# Patient Record
Sex: Male | Born: 1975
Health system: Southern US, Community
[De-identification: ages and names within clinical notes are randomized; demographics above are authoritative.]

## PROBLEM LIST (undated history)

## (undated) DIAGNOSIS — K5792 Diverticulitis of intestine, part unspecified, without perforation or abscess without bleeding: Secondary | ICD-10-CM

## (undated) DIAGNOSIS — G8929 Other chronic pain: Secondary | ICD-10-CM

## (undated) DIAGNOSIS — N200 Calculus of kidney: Secondary | ICD-10-CM

## (undated) DIAGNOSIS — Z765 Malingerer [conscious simulation]: Secondary | ICD-10-CM

## (undated) DIAGNOSIS — R109 Unspecified abdominal pain: Secondary | ICD-10-CM

## (undated) DIAGNOSIS — I10 Essential (primary) hypertension: Secondary | ICD-10-CM

## (undated) DIAGNOSIS — K861 Other chronic pancreatitis: Secondary | ICD-10-CM

## (undated) HISTORY — DX: Essential (primary) hypertension: I10

## (undated) HISTORY — PX: ERCP W/ METAL STENT PLACEMENT: SHX1521

## (undated) HISTORY — DX: Other chronic pancreatitis: K86.1

## (undated) HISTORY — PX: KIDNEY STONE SURGERY: SHX686

## (undated) HISTORY — DX: Diverticulitis of intestine, part unspecified, without perforation or abscess without bleeding: K57.92

## (undated) HISTORY — PX: CHOLECYSTECTOMY: SHX55

## (undated) HISTORY — PX: APPENDECTOMY: SHX54

---

## 2000-03-26 ENCOUNTER — Encounter: Admission: RE | Admit: 2000-03-26 | Discharge: 2000-03-26 | Payer: Self-pay | Admitting: Family Medicine

## 2000-03-26 ENCOUNTER — Encounter: Payer: Self-pay | Admitting: Family Medicine

## 2001-11-19 ENCOUNTER — Inpatient Hospital Stay (HOSPITAL_COMMUNITY): Admission: EM | Admit: 2001-11-19 | Discharge: 2001-11-21 | Payer: Self-pay

## 2001-11-19 ENCOUNTER — Encounter: Payer: Self-pay | Admitting: Neurosurgery

## 2002-06-18 ENCOUNTER — Encounter (HOSPITAL_COMMUNITY): Admission: RE | Admit: 2002-06-18 | Discharge: 2002-07-18 | Payer: Self-pay | Admitting: Family Medicine

## 2002-06-19 ENCOUNTER — Encounter: Payer: Self-pay | Admitting: Family Medicine

## 2002-08-13 ENCOUNTER — Encounter: Payer: Self-pay | Admitting: Family Medicine

## 2002-08-13 ENCOUNTER — Ambulatory Visit (HOSPITAL_COMMUNITY): Admission: RE | Admit: 2002-08-13 | Discharge: 2002-08-13 | Payer: Self-pay | Admitting: Family Medicine

## 2003-12-10 ENCOUNTER — Emergency Department (HOSPITAL_COMMUNITY): Admission: EM | Admit: 2003-12-10 | Discharge: 2003-12-10 | Payer: Self-pay | Admitting: *Deleted

## 2003-12-29 ENCOUNTER — Ambulatory Visit (HOSPITAL_COMMUNITY): Admission: RE | Admit: 2003-12-29 | Discharge: 2003-12-29 | Payer: Self-pay | Admitting: Internal Medicine

## 2004-01-03 ENCOUNTER — Ambulatory Visit (HOSPITAL_COMMUNITY): Admission: RE | Admit: 2004-01-03 | Discharge: 2004-01-03 | Payer: Self-pay | Admitting: Urology

## 2004-01-26 ENCOUNTER — Emergency Department (HOSPITAL_COMMUNITY): Admission: EM | Admit: 2004-01-26 | Discharge: 2004-01-26 | Payer: Self-pay | Admitting: Emergency Medicine

## 2004-03-02 ENCOUNTER — Ambulatory Visit (HOSPITAL_COMMUNITY): Admission: RE | Admit: 2004-03-02 | Discharge: 2004-03-02 | Payer: Self-pay | Admitting: Family Medicine

## 2004-07-14 ENCOUNTER — Ambulatory Visit (HOSPITAL_COMMUNITY): Admission: RE | Admit: 2004-07-14 | Discharge: 2004-07-14 | Payer: Self-pay | Admitting: Family Medicine

## 2004-07-25 ENCOUNTER — Emergency Department (HOSPITAL_COMMUNITY): Admission: EM | Admit: 2004-07-25 | Discharge: 2004-07-25 | Payer: Self-pay | Admitting: Emergency Medicine

## 2004-07-29 ENCOUNTER — Emergency Department (HOSPITAL_COMMUNITY): Admission: EM | Admit: 2004-07-29 | Discharge: 2004-07-29 | Payer: Self-pay | Admitting: Emergency Medicine

## 2004-08-11 ENCOUNTER — Encounter (INDEPENDENT_AMBULATORY_CARE_PROVIDER_SITE_OTHER): Payer: Self-pay | Admitting: *Deleted

## 2004-08-11 ENCOUNTER — Ambulatory Visit (HOSPITAL_BASED_OUTPATIENT_CLINIC_OR_DEPARTMENT_OTHER): Admission: RE | Admit: 2004-08-11 | Discharge: 2004-08-11 | Payer: Self-pay | Admitting: Urology

## 2004-08-11 ENCOUNTER — Ambulatory Visit (HOSPITAL_COMMUNITY): Admission: RE | Admit: 2004-08-11 | Discharge: 2004-08-11 | Payer: Self-pay | Admitting: Urology

## 2004-08-17 ENCOUNTER — Inpatient Hospital Stay (HOSPITAL_COMMUNITY): Admission: EM | Admit: 2004-08-17 | Discharge: 2004-08-18 | Payer: Self-pay | Admitting: Emergency Medicine

## 2004-08-20 ENCOUNTER — Emergency Department (HOSPITAL_COMMUNITY): Admission: EM | Admit: 2004-08-20 | Discharge: 2004-08-20 | Payer: Self-pay | Admitting: Emergency Medicine

## 2005-02-13 ENCOUNTER — Ambulatory Visit (HOSPITAL_COMMUNITY): Admission: RE | Admit: 2005-02-13 | Discharge: 2005-02-13 | Payer: Self-pay | Admitting: Neurosurgery

## 2005-05-11 ENCOUNTER — Emergency Department (HOSPITAL_COMMUNITY): Admission: EM | Admit: 2005-05-11 | Discharge: 2005-05-12 | Payer: Self-pay | Admitting: Emergency Medicine

## 2005-05-13 ENCOUNTER — Emergency Department (HOSPITAL_COMMUNITY): Admission: EM | Admit: 2005-05-13 | Discharge: 2005-05-13 | Payer: Self-pay | Admitting: Emergency Medicine

## 2005-05-23 ENCOUNTER — Ambulatory Visit (HOSPITAL_COMMUNITY): Admission: RE | Admit: 2005-05-23 | Discharge: 2005-05-23 | Payer: Self-pay | Admitting: Gastroenterology

## 2005-05-26 ENCOUNTER — Emergency Department (HOSPITAL_COMMUNITY): Admission: EM | Admit: 2005-05-26 | Discharge: 2005-05-26 | Payer: Self-pay | Admitting: Emergency Medicine

## 2005-05-28 DIAGNOSIS — K861 Other chronic pancreatitis: Secondary | ICD-10-CM

## 2005-05-28 HISTORY — DX: Other chronic pancreatitis: K86.1

## 2005-06-15 ENCOUNTER — Encounter: Admission: RE | Admit: 2005-06-15 | Discharge: 2005-06-15 | Payer: Self-pay | Admitting: Gastroenterology

## 2005-06-16 ENCOUNTER — Emergency Department (HOSPITAL_COMMUNITY): Admission: EM | Admit: 2005-06-16 | Discharge: 2005-06-16 | Payer: Self-pay | Admitting: Emergency Medicine

## 2005-06-21 ENCOUNTER — Emergency Department (HOSPITAL_COMMUNITY): Admission: EM | Admit: 2005-06-21 | Discharge: 2005-06-21 | Payer: Self-pay | Admitting: Emergency Medicine

## 2005-06-25 ENCOUNTER — Emergency Department (HOSPITAL_COMMUNITY): Admission: EM | Admit: 2005-06-25 | Discharge: 2005-06-26 | Payer: Self-pay | Admitting: Emergency Medicine

## 2005-06-28 ENCOUNTER — Ambulatory Visit: Payer: Self-pay | Admitting: Gastroenterology

## 2005-06-28 ENCOUNTER — Ambulatory Visit (HOSPITAL_COMMUNITY): Admission: RE | Admit: 2005-06-28 | Discharge: 2005-06-28 | Payer: Self-pay | Admitting: Gastroenterology

## 2005-08-11 ENCOUNTER — Emergency Department (HOSPITAL_COMMUNITY): Admission: EM | Admit: 2005-08-11 | Discharge: 2005-08-11 | Payer: Self-pay | Admitting: Emergency Medicine

## 2005-08-27 ENCOUNTER — Inpatient Hospital Stay (HOSPITAL_COMMUNITY): Admission: RE | Admit: 2005-08-27 | Discharge: 2005-08-28 | Payer: Self-pay | Admitting: Psychiatry

## 2005-08-27 ENCOUNTER — Emergency Department (HOSPITAL_COMMUNITY): Admission: EM | Admit: 2005-08-27 | Discharge: 2005-08-27 | Payer: Self-pay | Admitting: Emergency Medicine

## 2005-08-28 ENCOUNTER — Ambulatory Visit: Payer: Self-pay | Admitting: Psychiatry

## 2005-09-13 ENCOUNTER — Ambulatory Visit: Payer: Self-pay | Admitting: Internal Medicine

## 2005-12-16 ENCOUNTER — Emergency Department (HOSPITAL_COMMUNITY): Admission: EM | Admit: 2005-12-16 | Discharge: 2005-12-16 | Payer: Self-pay | Admitting: Emergency Medicine

## 2005-12-19 ENCOUNTER — Emergency Department (HOSPITAL_COMMUNITY): Admission: EM | Admit: 2005-12-19 | Discharge: 2005-12-19 | Payer: Self-pay | Admitting: Emergency Medicine

## 2006-01-29 ENCOUNTER — Emergency Department (HOSPITAL_COMMUNITY): Admission: EM | Admit: 2006-01-29 | Discharge: 2006-01-29 | Payer: Self-pay | Admitting: Emergency Medicine

## 2006-02-04 ENCOUNTER — Emergency Department (HOSPITAL_COMMUNITY): Admission: EM | Admit: 2006-02-04 | Discharge: 2006-02-05 | Payer: Self-pay | Admitting: Emergency Medicine

## 2006-02-26 ENCOUNTER — Emergency Department (HOSPITAL_COMMUNITY): Admission: EM | Admit: 2006-02-26 | Discharge: 2006-02-26 | Payer: Self-pay | Admitting: Emergency Medicine

## 2006-03-24 ENCOUNTER — Emergency Department (HOSPITAL_COMMUNITY): Admission: EM | Admit: 2006-03-24 | Discharge: 2006-03-24 | Payer: Self-pay | Admitting: Emergency Medicine

## 2006-03-26 ENCOUNTER — Emergency Department (HOSPITAL_COMMUNITY): Admission: EM | Admit: 2006-03-26 | Discharge: 2006-03-26 | Payer: Self-pay | Admitting: Emergency Medicine

## 2006-03-30 ENCOUNTER — Emergency Department (HOSPITAL_COMMUNITY): Admission: EM | Admit: 2006-03-30 | Discharge: 2006-03-31 | Payer: Self-pay | Admitting: Emergency Medicine

## 2006-04-06 ENCOUNTER — Emergency Department (HOSPITAL_COMMUNITY): Admission: EM | Admit: 2006-04-06 | Discharge: 2006-04-06 | Payer: Self-pay | Admitting: Emergency Medicine

## 2006-04-18 ENCOUNTER — Emergency Department (HOSPITAL_COMMUNITY): Admission: EM | Admit: 2006-04-18 | Discharge: 2006-04-19 | Payer: Self-pay | Admitting: Emergency Medicine

## 2006-04-23 ENCOUNTER — Emergency Department (HOSPITAL_COMMUNITY): Admission: EM | Admit: 2006-04-23 | Discharge: 2006-04-23 | Payer: Self-pay | Admitting: Emergency Medicine

## 2006-07-19 ENCOUNTER — Emergency Department (HOSPITAL_COMMUNITY): Admission: EM | Admit: 2006-07-19 | Discharge: 2006-07-19 | Payer: Self-pay | Admitting: Emergency Medicine

## 2006-08-06 ENCOUNTER — Emergency Department (HOSPITAL_COMMUNITY): Admission: EM | Admit: 2006-08-06 | Discharge: 2006-08-06 | Payer: Self-pay | Admitting: Emergency Medicine

## 2006-09-09 ENCOUNTER — Emergency Department (HOSPITAL_COMMUNITY): Admission: EM | Admit: 2006-09-09 | Discharge: 2006-09-09 | Payer: Self-pay | Admitting: Emergency Medicine

## 2006-09-11 ENCOUNTER — Emergency Department (HOSPITAL_COMMUNITY): Admission: EM | Admit: 2006-09-11 | Discharge: 2006-09-11 | Payer: Self-pay | Admitting: Emergency Medicine

## 2007-01-19 ENCOUNTER — Emergency Department (HOSPITAL_COMMUNITY): Admission: EM | Admit: 2007-01-19 | Discharge: 2007-01-19 | Payer: Self-pay | Admitting: Emergency Medicine

## 2007-05-26 ENCOUNTER — Inpatient Hospital Stay (HOSPITAL_COMMUNITY): Admission: EM | Admit: 2007-05-26 | Discharge: 2007-05-27 | Payer: Self-pay | Admitting: Emergency Medicine

## 2007-09-16 ENCOUNTER — Emergency Department (HOSPITAL_COMMUNITY): Admission: EM | Admit: 2007-09-16 | Discharge: 2007-09-16 | Payer: Self-pay | Admitting: Emergency Medicine

## 2007-09-21 ENCOUNTER — Emergency Department (HOSPITAL_COMMUNITY): Admission: EM | Admit: 2007-09-21 | Discharge: 2007-09-21 | Payer: Self-pay | Admitting: Emergency Medicine

## 2007-11-04 ENCOUNTER — Emergency Department (HOSPITAL_COMMUNITY): Admission: EM | Admit: 2007-11-04 | Discharge: 2007-11-04 | Payer: Self-pay | Admitting: Emergency Medicine

## 2007-12-06 ENCOUNTER — Emergency Department (HOSPITAL_COMMUNITY): Admission: EM | Admit: 2007-12-06 | Discharge: 2007-12-06 | Payer: Self-pay | Admitting: Emergency Medicine

## 2008-01-06 ENCOUNTER — Ambulatory Visit (HOSPITAL_COMMUNITY): Admission: RE | Admit: 2008-01-06 | Discharge: 2008-01-06 | Payer: Self-pay | Admitting: Family Medicine

## 2008-01-12 ENCOUNTER — Emergency Department (HOSPITAL_BASED_OUTPATIENT_CLINIC_OR_DEPARTMENT_OTHER): Admission: EM | Admit: 2008-01-12 | Discharge: 2008-01-12 | Payer: Self-pay | Admitting: Emergency Medicine

## 2008-01-19 ENCOUNTER — Ambulatory Visit: Payer: Self-pay | Admitting: Gastroenterology

## 2008-01-23 ENCOUNTER — Emergency Department (HOSPITAL_COMMUNITY): Admission: EM | Admit: 2008-01-23 | Discharge: 2008-01-23 | Payer: Self-pay | Admitting: Emergency Medicine

## 2008-02-04 ENCOUNTER — Emergency Department (HOSPITAL_COMMUNITY): Admission: EM | Admit: 2008-02-04 | Discharge: 2008-02-04 | Payer: Self-pay | Admitting: Emergency Medicine

## 2008-02-10 ENCOUNTER — Emergency Department (HOSPITAL_COMMUNITY): Admission: EM | Admit: 2008-02-10 | Discharge: 2008-02-10 | Payer: Self-pay | Admitting: Emergency Medicine

## 2008-03-18 ENCOUNTER — Emergency Department (HOSPITAL_COMMUNITY): Admission: EM | Admit: 2008-03-18 | Discharge: 2008-03-18 | Payer: Self-pay | Admitting: Emergency Medicine

## 2008-04-06 ENCOUNTER — Emergency Department (HOSPITAL_COMMUNITY): Admission: EM | Admit: 2008-04-06 | Discharge: 2008-04-06 | Payer: Self-pay | Admitting: Emergency Medicine

## 2008-04-27 ENCOUNTER — Emergency Department (HOSPITAL_COMMUNITY): Admission: EM | Admit: 2008-04-27 | Discharge: 2008-04-27 | Payer: Self-pay | Admitting: Emergency Medicine

## 2008-05-24 ENCOUNTER — Ambulatory Visit: Payer: Self-pay | Admitting: Diagnostic Radiology

## 2008-05-24 ENCOUNTER — Emergency Department (HOSPITAL_BASED_OUTPATIENT_CLINIC_OR_DEPARTMENT_OTHER): Admission: EM | Admit: 2008-05-24 | Discharge: 2008-05-24 | Payer: Self-pay | Admitting: Emergency Medicine

## 2008-08-10 ENCOUNTER — Emergency Department (HOSPITAL_COMMUNITY): Admission: EM | Admit: 2008-08-10 | Discharge: 2008-08-10 | Payer: Self-pay | Admitting: Emergency Medicine

## 2008-09-23 ENCOUNTER — Emergency Department (HOSPITAL_COMMUNITY): Admission: EM | Admit: 2008-09-23 | Discharge: 2008-09-24 | Payer: Self-pay | Admitting: Emergency Medicine

## 2008-10-13 ENCOUNTER — Emergency Department (HOSPITAL_COMMUNITY): Admission: EM | Admit: 2008-10-13 | Discharge: 2008-10-13 | Payer: Self-pay | Admitting: Ophthalmology

## 2008-11-11 ENCOUNTER — Emergency Department (HOSPITAL_COMMUNITY): Admission: EM | Admit: 2008-11-11 | Discharge: 2008-11-11 | Payer: Self-pay | Admitting: Emergency Medicine

## 2009-01-06 ENCOUNTER — Emergency Department (HOSPITAL_COMMUNITY): Admission: EM | Admit: 2009-01-06 | Discharge: 2009-01-06 | Payer: Self-pay | Admitting: Emergency Medicine

## 2009-04-19 ENCOUNTER — Emergency Department (HOSPITAL_COMMUNITY): Admission: EM | Admit: 2009-04-19 | Discharge: 2009-04-19 | Payer: Self-pay | Admitting: Emergency Medicine

## 2009-04-27 ENCOUNTER — Emergency Department (HOSPITAL_COMMUNITY): Admission: EM | Admit: 2009-04-27 | Discharge: 2009-04-27 | Payer: Self-pay | Admitting: Emergency Medicine

## 2009-05-16 ENCOUNTER — Emergency Department (HOSPITAL_COMMUNITY): Admission: EM | Admit: 2009-05-16 | Discharge: 2009-05-16 | Payer: Self-pay | Admitting: Emergency Medicine

## 2009-06-05 ENCOUNTER — Emergency Department (HOSPITAL_COMMUNITY): Admission: EM | Admit: 2009-06-05 | Discharge: 2009-06-05 | Payer: Self-pay | Admitting: Emergency Medicine

## 2009-09-07 ENCOUNTER — Emergency Department (HOSPITAL_COMMUNITY): Admission: EM | Admit: 2009-09-07 | Discharge: 2009-09-07 | Payer: Self-pay | Admitting: Emergency Medicine

## 2009-10-08 ENCOUNTER — Emergency Department (HOSPITAL_COMMUNITY): Admission: EM | Admit: 2009-10-08 | Discharge: 2009-10-08 | Payer: Self-pay | Admitting: Emergency Medicine

## 2009-10-17 ENCOUNTER — Emergency Department (HOSPITAL_COMMUNITY): Admission: EM | Admit: 2009-10-17 | Discharge: 2009-10-18 | Payer: Self-pay | Admitting: Emergency Medicine

## 2009-11-02 ENCOUNTER — Emergency Department (HOSPITAL_COMMUNITY): Admission: EM | Admit: 2009-11-02 | Discharge: 2009-11-02 | Payer: Self-pay | Admitting: Emergency Medicine

## 2009-11-06 ENCOUNTER — Emergency Department (HOSPITAL_COMMUNITY): Admission: EM | Admit: 2009-11-06 | Discharge: 2009-11-06 | Payer: Self-pay | Admitting: Emergency Medicine

## 2009-11-18 ENCOUNTER — Emergency Department (HOSPITAL_COMMUNITY): Admission: EM | Admit: 2009-11-18 | Discharge: 2009-11-18 | Payer: Self-pay | Admitting: Emergency Medicine

## 2009-12-07 ENCOUNTER — Ambulatory Visit (HOSPITAL_BASED_OUTPATIENT_CLINIC_OR_DEPARTMENT_OTHER): Admission: RE | Admit: 2009-12-07 | Discharge: 2009-12-07 | Payer: Self-pay | Admitting: Urology

## 2009-12-31 ENCOUNTER — Emergency Department (HOSPITAL_COMMUNITY): Admission: EM | Admit: 2009-12-31 | Discharge: 2009-12-31 | Payer: Self-pay | Admitting: Internal Medicine

## 2010-01-28 ENCOUNTER — Emergency Department (HOSPITAL_COMMUNITY): Admission: EM | Admit: 2010-01-28 | Discharge: 2010-01-28 | Payer: Self-pay | Admitting: Emergency Medicine

## 2010-02-07 ENCOUNTER — Emergency Department (HOSPITAL_COMMUNITY): Admission: EM | Admit: 2010-02-07 | Discharge: 2010-02-07 | Payer: Self-pay | Admitting: Emergency Medicine

## 2010-02-16 ENCOUNTER — Emergency Department (HOSPITAL_COMMUNITY): Admission: EM | Admit: 2010-02-16 | Discharge: 2010-02-16 | Payer: Self-pay | Admitting: Emergency Medicine

## 2010-02-26 ENCOUNTER — Emergency Department (HOSPITAL_COMMUNITY): Admission: EM | Admit: 2010-02-26 | Discharge: 2010-02-26 | Payer: Self-pay | Admitting: Emergency Medicine

## 2010-03-16 ENCOUNTER — Emergency Department (HOSPITAL_COMMUNITY): Admission: EM | Admit: 2010-03-16 | Discharge: 2010-03-16 | Payer: Self-pay | Admitting: Emergency Medicine

## 2010-03-21 ENCOUNTER — Emergency Department (HOSPITAL_COMMUNITY): Admission: EM | Admit: 2010-03-21 | Discharge: 2010-03-21 | Payer: Self-pay | Admitting: Emergency Medicine

## 2010-03-25 ENCOUNTER — Emergency Department (HOSPITAL_COMMUNITY): Admission: EM | Admit: 2010-03-25 | Discharge: 2010-03-25 | Payer: Self-pay | Admitting: Emergency Medicine

## 2010-04-09 ENCOUNTER — Emergency Department (HOSPITAL_COMMUNITY): Admission: EM | Admit: 2010-04-09 | Discharge: 2010-04-09 | Payer: Self-pay | Admitting: Emergency Medicine

## 2010-04-23 ENCOUNTER — Emergency Department (HOSPITAL_COMMUNITY): Admission: EM | Admit: 2010-04-23 | Discharge: 2010-04-23 | Payer: Self-pay | Admitting: Emergency Medicine

## 2010-05-04 ENCOUNTER — Emergency Department (HOSPITAL_COMMUNITY): Admission: EM | Admit: 2010-05-04 | Discharge: 2010-03-07 | Payer: Self-pay | Admitting: Emergency Medicine

## 2010-05-16 ENCOUNTER — Emergency Department (HOSPITAL_COMMUNITY)
Admission: EM | Admit: 2010-05-16 | Discharge: 2010-05-16 | Payer: Self-pay | Source: Home / Self Care | Admitting: Emergency Medicine

## 2010-05-20 ENCOUNTER — Emergency Department (HOSPITAL_COMMUNITY)
Admission: EM | Admit: 2010-05-20 | Discharge: 2010-05-20 | Payer: Self-pay | Source: Home / Self Care | Admitting: Emergency Medicine

## 2010-05-25 ENCOUNTER — Emergency Department (HOSPITAL_COMMUNITY)
Admission: EM | Admit: 2010-05-25 | Discharge: 2010-05-25 | Payer: Self-pay | Source: Home / Self Care | Admitting: Emergency Medicine

## 2010-06-13 ENCOUNTER — Emergency Department (HOSPITAL_COMMUNITY)
Admission: EM | Admit: 2010-06-13 | Discharge: 2010-06-13 | Payer: Self-pay | Source: Home / Self Care | Admitting: Emergency Medicine

## 2010-06-14 LAB — CBC
HCT: 47.4 % (ref 39.0–52.0)
Hemoglobin: 16.8 g/dL (ref 13.0–17.0)
MCH: 31.7 pg (ref 26.0–34.0)
MCHC: 35.4 g/dL (ref 30.0–36.0)
MCV: 89.4 fL (ref 78.0–100.0)
Platelets: 201 10*3/uL (ref 150–400)
RBC: 5.3 MIL/uL (ref 4.22–5.81)
RDW: 12.5 % (ref 11.5–15.5)
WBC: 9 10*3/uL (ref 4.0–10.5)

## 2010-06-14 LAB — DIFFERENTIAL
Basophils Absolute: 0.1 10*3/uL (ref 0.0–0.1)
Basophils Relative: 1 % (ref 0–1)
Eosinophils Absolute: 0.2 10*3/uL (ref 0.0–0.7)
Eosinophils Relative: 2 % (ref 0–5)
Lymphocytes Relative: 31 % (ref 12–46)
Lymphs Abs: 2.8 10*3/uL (ref 0.7–4.0)
Monocytes Absolute: 1.1 10*3/uL — ABNORMAL HIGH (ref 0.1–1.0)
Monocytes Relative: 12 % (ref 3–12)
Neutro Abs: 4.8 10*3/uL (ref 1.7–7.7)
Neutrophils Relative %: 54 % (ref 43–77)

## 2010-06-14 LAB — COMPREHENSIVE METABOLIC PANEL
ALT: 26 U/L (ref 0–53)
AST: 23 U/L (ref 0–37)
Albumin: 4.6 g/dL (ref 3.5–5.2)
Alkaline Phosphatase: 54 U/L (ref 39–117)
BUN: 12 mg/dL (ref 6–23)
CO2: 26 mEq/L (ref 19–32)
Calcium: 10 mg/dL (ref 8.4–10.5)
Chloride: 105 mEq/L (ref 96–112)
Creatinine, Ser: 0.85 mg/dL (ref 0.4–1.5)
GFR calc Af Amer: >60 mL/min (ref >60)
GFR calc non Af Amer: >60 mL/min (ref >60)
Glucose, Bld: 95 mg/dL (ref 70–99)
Potassium: 3.4 mEq/L — ABNORMAL LOW (ref 3.5–5.1)
Sodium: 140 mEq/L (ref 135–145)
Total Bilirubin: 0.8 mg/dL (ref 0.3–1.2)
Total Protein: 8.1 g/dL (ref 6.0–8.3)

## 2010-06-14 LAB — URINALYSIS, ROUTINE W REFLEX MICROSCOPIC
Bilirubin Urine: NEGATIVE
Ketones, ur: NEGATIVE mg/dL
Nitrite: NEGATIVE
Protein, ur: NEGATIVE mg/dL
Specific Gravity, Urine: 1.021 (ref 1.005–1.030)
Urine Glucose, Fasting: NEGATIVE mg/dL
Urobilinogen, UA: 0.2 mg/dL (ref 0.0–1.0)
pH: 5.5 (ref 5.0–8.0)

## 2010-06-14 LAB — LIPASE, BLOOD: Lipase: 31 U/L (ref 11–59)

## 2010-06-14 LAB — URINE MICROSCOPIC-ADD ON

## 2010-06-14 LAB — OCCULT BLOOD, POC DEVICE: Fecal Occult Bld: NEGATIVE

## 2010-06-19 ENCOUNTER — Encounter: Payer: Self-pay | Admitting: Family Medicine

## 2010-06-19 LAB — URINE CULTURE
Colony Count: NO GROWTH
Culture  Setup Time: 201201171055
Culture: NO GROWTH

## 2010-07-02 ENCOUNTER — Emergency Department (HOSPITAL_COMMUNITY): Payer: BC Managed Care – PPO

## 2010-07-02 ENCOUNTER — Emergency Department (HOSPITAL_COMMUNITY)
Admission: EM | Admit: 2010-07-02 | Discharge: 2010-07-02 | Disposition: A | Discharge status: Home / Self Care | Payer: BC Managed Care – PPO | Attending: Emergency Medicine | Admitting: Emergency Medicine

## 2010-07-02 DIAGNOSIS — R1011 Right upper quadrant pain: Secondary | ICD-10-CM | POA: Insufficient documentation

## 2010-07-02 DIAGNOSIS — Z87442 Personal history of urinary calculi: Secondary | ICD-10-CM | POA: Insufficient documentation

## 2010-07-02 LAB — COMPREHENSIVE METABOLIC PANEL
Albumin: 4.5 g/dL (ref 3.5–5.2)
BUN: 13 mg/dL (ref 6–23)
Calcium: 9.9 mg/dL (ref 8.4–10.5)
Creatinine, Ser: 0.93 mg/dL (ref 0.4–1.5)
Total Bilirubin: 0.7 mg/dL (ref 0.3–1.2)
Total Protein: 8 g/dL (ref 6.0–8.3)

## 2010-07-02 LAB — CBC
MCV: 86.2 fL (ref 78.0–100.0)
Platelets: 218 10*3/uL (ref 150–400)
RBC: 5.35 MIL/uL (ref 4.22–5.81)
WBC: 9.8 10*3/uL (ref 4.0–10.5)

## 2010-07-02 LAB — DIFFERENTIAL
Basophils Relative: 1 % (ref 0–1)
Eosinophils Absolute: 0.2 10*3/uL (ref 0.0–0.7)
Lymphs Abs: 2.6 10*3/uL (ref 0.7–4.0)
Neutro Abs: 6.1 10*3/uL (ref 1.7–7.7)
Neutrophils Relative %: 62 % (ref 43–77)

## 2010-07-02 LAB — URINALYSIS, ROUTINE W REFLEX MICROSCOPIC
Protein, ur: NEGATIVE mg/dL
Specific Gravity, Urine: 1.024 (ref 1.005–1.030)
Urine Glucose, Fasting: NEGATIVE mg/dL

## 2010-07-02 MED ORDER — IOHEXOL 300 MG/ML  SOLN: 125.0000 mL | Freq: Once | INTRAMUSCULAR | Status: DC | PRN

## 2010-07-28 ENCOUNTER — Emergency Department (HOSPITAL_COMMUNITY)
Admission: EM | Admit: 2010-07-28 | Discharge: 2010-07-28 | Disposition: A | Discharge status: Home / Self Care | Payer: BC Managed Care – PPO | Attending: Emergency Medicine | Admitting: Emergency Medicine

## 2010-07-28 DIAGNOSIS — R109 Unspecified abdominal pain: Secondary | ICD-10-CM | POA: Insufficient documentation

## 2010-07-28 DIAGNOSIS — Z87442 Personal history of urinary calculi: Secondary | ICD-10-CM | POA: Insufficient documentation

## 2010-07-28 DIAGNOSIS — R319 Hematuria, unspecified: Secondary | ICD-10-CM | POA: Insufficient documentation

## 2010-07-28 LAB — URINALYSIS, ROUTINE W REFLEX MICROSCOPIC
Leukocytes, UA: NEGATIVE
Nitrite: NEGATIVE
Specific Gravity, Urine: 1.024 (ref 1.005–1.030)
pH: 5.5 (ref 5.0–8.0)

## 2010-07-28 LAB — URINE MICROSCOPIC-ADD ON

## 2010-08-04 ENCOUNTER — Emergency Department (HOSPITAL_COMMUNITY)
Admission: EM | Admit: 2010-08-04 | Discharge: 2010-08-04 | Disposition: A | Discharge status: Home / Self Care | Payer: BC Managed Care – PPO | Attending: Emergency Medicine | Admitting: Emergency Medicine

## 2010-08-04 DIAGNOSIS — Z9089 Acquired absence of other organs: Secondary | ICD-10-CM | POA: Insufficient documentation

## 2010-08-04 DIAGNOSIS — R109 Unspecified abdominal pain: Secondary | ICD-10-CM | POA: Insufficient documentation

## 2010-08-04 DIAGNOSIS — Z87442 Personal history of urinary calculi: Secondary | ICD-10-CM | POA: Insufficient documentation

## 2010-08-04 DIAGNOSIS — R112 Nausea with vomiting, unspecified: Secondary | ICD-10-CM | POA: Insufficient documentation

## 2010-08-04 LAB — URINALYSIS, ROUTINE W REFLEX MICROSCOPIC
Glucose, UA: NEGATIVE mg/dL
Hgb urine dipstick: NEGATIVE
Ketones, ur: NEGATIVE mg/dL
Protein, ur: NEGATIVE mg/dL

## 2010-08-07 LAB — COMPREHENSIVE METABOLIC PANEL
ALT: 39 U/L (ref 0–53)
AST: 30 U/L (ref 0–37)
AST: 35 U/L (ref 0–37)
Alkaline Phosphatase: 52 U/L (ref 39–117)
Alkaline Phosphatase: 56 U/L (ref 39–117)
BUN: 10 mg/dL (ref 6–23)
BUN: 10 mg/dL (ref 6–23)
CO2: 22 mEq/L (ref 19–32)
CO2: 28 mEq/L (ref 19–32)
Calcium: 10.2 mg/dL (ref 8.4–10.5)
Chloride: 105 mEq/L (ref 96–112)
Creatinine, Ser: 0.88 mg/dL (ref 0.4–1.5)
GFR calc Af Amer: >60 mL/min (ref >60)
GFR calc non Af Amer: >60 mL/min (ref >60)
GFR calc non Af Amer: >60 mL/min (ref >60)
GFR calc non Af Amer: >60 mL/min (ref >60)
Glucose, Bld: 99 mg/dL (ref 70–99)
Potassium: 4.9 mEq/L (ref 3.5–5.1)
Sodium: 145 mEq/L (ref 135–145)
Total Bilirubin: 0.7 mg/dL (ref 0.3–1.2)
Total Protein: 7 g/dL (ref 6.0–8.3)
Total Protein: 7.6 g/dL (ref 6.0–8.3)

## 2010-08-07 LAB — DIFFERENTIAL
Basophils Absolute: 0 10*3/uL (ref 0.0–0.1)
Basophils Absolute: 0.1 10*3/uL (ref 0.0–0.1)
Basophils Relative: 0 % (ref 0–1)
Basophils Relative: 1 % (ref 0–1)
Eosinophils Absolute: 0.3 10*3/uL (ref 0.0–0.7)
Eosinophils Relative: 2 % (ref 0–5)
Eosinophils Relative: 2 % (ref 0–5)
Lymphocytes Relative: 28 % (ref 12–46)
Lymphs Abs: 2.9 10*3/uL (ref 0.7–4.0)
Monocytes Relative: 13 % — ABNORMAL HIGH (ref 3–12)
Monocytes Relative: 13 % — ABNORMAL HIGH (ref 3–12)
Neutro Abs: 5.6 10*3/uL (ref 1.7–7.7)
Neutrophils Relative %: 51 % (ref 43–77)

## 2010-08-07 LAB — CBC
HCT: 44.9 % (ref 39.0–52.0)
Hemoglobin: 16.5 g/dL (ref 13.0–17.0)
Hemoglobin: 16.7 g/dL (ref 13.0–17.0)
MCH: 32 pg (ref 26.0–34.0)
MCHC: 35.6 g/dL (ref 30.0–36.0)
MCHC: 35.8 g/dL (ref 30.0–36.0)
MCV: 88.8 fL (ref 78.0–100.0)
MCV: 90.3 fL (ref 78.0–100.0)
RBC: 5.16 MIL/uL (ref 4.22–5.81)
RDW: 12.7 % (ref 11.5–15.5)
WBC: 11.3 10*3/uL — ABNORMAL HIGH (ref 4.0–10.5)

## 2010-08-07 LAB — URINALYSIS, ROUTINE W REFLEX MICROSCOPIC
Bilirubin Urine: NEGATIVE
Glucose, UA: NEGATIVE mg/dL
Hgb urine dipstick: NEGATIVE
Ketones, ur: NEGATIVE mg/dL
pH: 5.5 (ref 5.0–8.0)

## 2010-08-07 LAB — LIPASE, BLOOD
Lipase: 47 U/L (ref 11–59)
Lipase: 51 U/L (ref 11–59)

## 2010-08-08 LAB — COMPREHENSIVE METABOLIC PANEL
ALT: 26 U/L (ref 0–53)
ALT: 35 U/L (ref 0–53)
AST: 24 U/L (ref 0–37)
AST: 28 U/L (ref 0–37)
Albumin: 4.5 g/dL (ref 3.5–5.2)
CO2: 24 mEq/L (ref 19–32)
CO2: 25 mEq/L (ref 19–32)
Calcium: 9.4 mg/dL (ref 8.4–10.5)
Calcium: 9.7 mg/dL (ref 8.4–10.5)
Chloride: 108 mEq/L (ref 96–112)
Creatinine, Ser: 0.84 mg/dL (ref 0.4–1.5)
GFR calc Af Amer: >60 mL/min (ref >60)
GFR calc Af Amer: >60 mL/min (ref >60)
GFR calc non Af Amer: >60 mL/min (ref >60)
Glucose, Bld: 104 mg/dL — ABNORMAL HIGH (ref 70–99)
Sodium: 140 mEq/L (ref 135–145)
Sodium: 141 mEq/L (ref 135–145)
Total Bilirubin: 0.7 mg/dL (ref 0.3–1.2)
Total Protein: 7.8 g/dL (ref 6.0–8.3)

## 2010-08-08 LAB — DIFFERENTIAL
Basophils Absolute: 0.1 10*3/uL (ref 0.0–0.1)
Basophils Relative: 1 % (ref 0–1)
Eosinophils Absolute: 0.1 10*3/uL (ref 0.0–0.7)
Eosinophils Absolute: 0.2 10*3/uL (ref 0.0–0.7)
Eosinophils Relative: 1 % (ref 0–5)
Eosinophils Relative: 2 % (ref 0–5)
Lymphocytes Relative: 27 % (ref 12–46)
Lymphs Abs: 2.3 10*3/uL (ref 0.7–4.0)
Lymphs Abs: 2.4 10*3/uL (ref 0.7–4.0)
Monocytes Absolute: 0.9 10*3/uL (ref 0.1–1.0)
Monocytes Relative: 10 % (ref 3–12)
Neutrophils Relative %: 67 % (ref 43–77)

## 2010-08-08 LAB — LIPASE, BLOOD: Lipase: 39 U/L (ref 11–59)

## 2010-08-08 LAB — CBC
HCT: 43.3 % (ref 39.0–52.0)
Hemoglobin: 15.4 g/dL (ref 13.0–17.0)
Hemoglobin: 16.8 g/dL (ref 13.0–17.0)
MCH: 32.4 pg (ref 26.0–34.0)
MCHC: 35.4 g/dL (ref 30.0–36.0)
MCHC: 35.7 g/dL (ref 30.0–36.0)
Platelets: 231 10*3/uL (ref 150–400)
RBC: 4.74 MIL/uL (ref 4.22–5.81)
RDW: 12.4 % (ref 11.5–15.5)

## 2010-08-08 LAB — URINALYSIS, ROUTINE W REFLEX MICROSCOPIC
Bilirubin Urine: NEGATIVE
Glucose, UA: NEGATIVE mg/dL
Ketones, ur: NEGATIVE mg/dL
Specific Gravity, Urine: 1.025 (ref 1.005–1.030)
pH: 6 (ref 5.0–8.0)

## 2010-08-09 LAB — COMPREHENSIVE METABOLIC PANEL
ALT: 25 U/L (ref 0–53)
ALT: 31 U/L (ref 0–53)
ALT: 31 U/L (ref 0–53)
AST: 27 U/L (ref 0–37)
AST: 32 U/L (ref 0–37)
AST: 36 U/L (ref 0–37)
Albumin: 4.7 g/dL (ref 3.5–5.2)
Alkaline Phosphatase: 78 U/L (ref 39–117)
CO2: 25 mEq/L (ref 19–32)
Calcium: 10 mg/dL (ref 8.4–10.5)
Calcium: 10 mg/dL (ref 8.4–10.5)
Chloride: 109 mEq/L (ref 96–112)
Creatinine, Ser: 0.72 mg/dL (ref 0.4–1.5)
GFR calc Af Amer: >60 mL/min (ref >60)
GFR calc Af Amer: >60 mL/min (ref >60)
GFR calc Af Amer: >60 mL/min (ref >60)
GFR calc non Af Amer: >60 mL/min (ref >60)
Glucose, Bld: 102 mg/dL — ABNORMAL HIGH (ref 70–99)
Glucose, Bld: 98 mg/dL (ref 70–99)
Potassium: 3.2 mEq/L — ABNORMAL LOW (ref 3.5–5.1)
Sodium: 140 mEq/L (ref 135–145)
Sodium: 141 mEq/L (ref 135–145)
Total Bilirubin: 0.6 mg/dL (ref 0.3–1.2)
Total Protein: 7.7 g/dL (ref 6.0–8.3)
Total Protein: 7.9 g/dL (ref 6.0–8.3)

## 2010-08-09 LAB — DIFFERENTIAL
Eosinophils Absolute: 0.2 10*3/uL (ref 0.0–0.7)
Eosinophils Absolute: 0.2 10*3/uL (ref 0.0–0.7)
Eosinophils Relative: 2 % (ref 0–5)
Lymphocytes Relative: 23 % (ref 12–46)
Lymphs Abs: 2.8 10*3/uL (ref 0.7–4.0)
Lymphs Abs: 4 10*3/uL (ref 0.7–4.0)
Monocytes Absolute: 1.2 10*3/uL — ABNORMAL HIGH (ref 0.1–1.0)
Monocytes Relative: 10 % (ref 3–12)
Monocytes Relative: 9 % (ref 3–12)
Neutrophils Relative %: 55 % (ref 43–77)

## 2010-08-09 LAB — CBC
MCHC: 34.9 g/dL (ref 30.0–36.0)
MCHC: 35.5 g/dL (ref 30.0–36.0)
Platelets: 266 10*3/uL (ref 150–400)
Platelets: 292 10*3/uL (ref 150–400)
RDW: 12.7 % (ref 11.5–15.5)
RDW: 12.9 % (ref 11.5–15.5)
WBC: 12.1 10*3/uL — ABNORMAL HIGH (ref 4.0–10.5)

## 2010-08-09 LAB — LIPASE, BLOOD: Lipase: 26 U/L (ref 11–59)

## 2010-08-10 LAB — URINALYSIS, ROUTINE W REFLEX MICROSCOPIC
Glucose, UA: NEGATIVE mg/dL
Glucose, UA: NEGATIVE mg/dL
Glucose, UA: NEGATIVE mg/dL
Hgb urine dipstick: NEGATIVE
Ketones, ur: NEGATIVE mg/dL
Nitrite: NEGATIVE
Nitrite: NEGATIVE
Specific Gravity, Urine: 1.014 (ref 1.005–1.030)
Specific Gravity, Urine: 1.025 (ref 1.005–1.030)
Specific Gravity, Urine: 1.025 (ref 1.005–1.030)
Specific Gravity, Urine: 1.027 (ref 1.005–1.030)
Urobilinogen, UA: 0.2 mg/dL (ref 0.0–1.0)
Urobilinogen, UA: 0.2 mg/dL (ref 0.0–1.0)
pH: 5.5 (ref 5.0–8.0)
pH: 5.5 (ref 5.0–8.0)
pH: 6 (ref 5.0–8.0)

## 2010-08-10 LAB — CBC
HCT: 43.9 % (ref 39.0–52.0)
HCT: 46.2 % (ref 39.0–52.0)
Hemoglobin: 15.5 g/dL (ref 13.0–17.0)
Hemoglobin: 16.1 g/dL (ref 13.0–17.0)
Hemoglobin: 16.2 g/dL (ref 13.0–17.0)
MCH: 32.1 pg (ref 26.0–34.0)
MCH: 32.5 pg (ref 26.0–34.0)
MCHC: 34.9 g/dL (ref 30.0–36.0)
MCHC: 35.2 g/dL (ref 30.0–36.0)
MCHC: 35.8 g/dL (ref 30.0–36.0)
MCV: 90.8 fL (ref 78.0–100.0)
MCV: 91.8 fL (ref 78.0–100.0)
Platelets: 200 10*3/uL (ref 150–400)
Platelets: 220 10*3/uL (ref 150–400)
RBC: 4.64 MIL/uL (ref 4.22–5.81)
RBC: 4.8 MIL/uL (ref 4.22–5.81)
RBC: 4.84 MIL/uL (ref 4.22–5.81)
RBC: 4.98 MIL/uL (ref 4.22–5.81)
RDW: 13.1 % (ref 11.5–15.5)
RDW: 13.2 % (ref 11.5–15.5)
WBC: 11.1 10*3/uL — ABNORMAL HIGH (ref 4.0–10.5)
WBC: 7.7 10*3/uL (ref 4.0–10.5)

## 2010-08-10 LAB — COMPREHENSIVE METABOLIC PANEL
ALT: 19 U/L (ref 0–53)
ALT: 25 U/L (ref 0–53)
ALT: 27 U/L (ref 0–53)
AST: 20 U/L (ref 0–37)
AST: 24 U/L (ref 0–37)
AST: 33 U/L (ref 0–37)
Albumin: 4.5 g/dL (ref 3.5–5.2)
Albumin: 4.2 g/dL (ref 3.5–5.2)
Alkaline Phosphatase: 55 U/L (ref 39–117)
Alkaline Phosphatase: 55 U/L (ref 39–117)
BUN: 10 mg/dL (ref 6–23)
CO2: 23 mEq/L (ref 19–32)
CO2: 25 mEq/L (ref 19–32)
CO2: 27 mEq/L (ref 19–32)
Calcium: 10 mg/dL (ref 8.4–10.5)
Calcium: 9.6 mg/dL (ref 8.4–10.5)
Calcium: 9.7 mg/dL (ref 8.4–10.5)
Chloride: 107 mEq/L (ref 96–112)
Chloride: 109 mEq/L (ref 96–112)
Chloride: 110 mEq/L (ref 96–112)
Creatinine, Ser: 0.77 mg/dL (ref 0.4–1.5)
GFR calc Af Amer: >60 mL/min (ref >60)
GFR calc Af Amer: >60 mL/min (ref >60)
GFR calc Af Amer: >60 mL/min (ref >60)
GFR calc non Af Amer: >60 mL/min (ref >60)
GFR calc non Af Amer: >60 mL/min (ref >60)
Glucose, Bld: 111 mg/dL — ABNORMAL HIGH (ref 70–99)
Glucose, Bld: 93 mg/dL (ref 70–99)
Potassium: 3.3 mEq/L — ABNORMAL LOW (ref 3.5–5.1)
Potassium: 3.4 mEq/L — ABNORMAL LOW (ref 3.5–5.1)
Potassium: 3.7 mEq/L (ref 3.5–5.1)
Sodium: 140 mEq/L (ref 135–145)
Sodium: 141 mEq/L (ref 135–145)
Sodium: 141 mEq/L (ref 135–145)
Sodium: 142 mEq/L (ref 135–145)
Total Bilirubin: 0.7 mg/dL (ref 0.3–1.2)
Total Bilirubin: 0.8 mg/dL (ref 0.3–1.2)
Total Protein: 7 g/dL (ref 6.0–8.3)
Total Protein: 7.7 g/dL (ref 6.0–8.3)

## 2010-08-10 LAB — DIFFERENTIAL
Basophils Absolute: 0 10*3/uL (ref 0.0–0.1)
Basophils Absolute: 0 10*3/uL (ref 0.0–0.1)
Basophils Absolute: 0.1 10*3/uL (ref 0.0–0.1)
Basophils Absolute: 0.1 10*3/uL (ref 0.0–0.1)
Basophils Relative: 0 % (ref 0–1)
Basophils Relative: 0 % (ref 0–1)
Basophils Relative: 1 % (ref 0–1)
Eosinophils Absolute: 0.1 10*3/uL (ref 0.0–0.7)
Eosinophils Absolute: 0.1 10*3/uL (ref 0.0–0.7)
Eosinophils Relative: 1 % (ref 0–5)
Eosinophils Relative: 2 % (ref 0–5)
Eosinophils Relative: 2 % (ref 0–5)
Lymphocytes Relative: 23 % (ref 12–46)
Lymphocytes Relative: 25 % (ref 12–46)
Lymphocytes Relative: 32 % (ref 12–46)
Lymphs Abs: 2.9 10*3/uL (ref 0.7–4.0)
Monocytes Absolute: 1.1 10*3/uL — ABNORMAL HIGH (ref 0.1–1.0)
Monocytes Absolute: 0.8 10*3/uL (ref 0.1–1.0)
Monocytes Relative: 10 % (ref 3–12)
Monocytes Relative: 10 % (ref 3–12)
Monocytes Relative: 10 % (ref 3–12)
Neutro Abs: 6.2 10*3/uL (ref 1.7–7.7)
Neutro Abs: 7.7 10*3/uL (ref 1.7–7.7)
Neutrophils Relative %: 56 % (ref 43–77)
Neutrophils Relative %: 63 % (ref 43–77)
Neutrophils Relative %: 65 % (ref 43–77)
Neutrophils Relative %: 67 % (ref 43–77)

## 2010-08-10 LAB — RAPID URINE DRUG SCREEN, HOSP PERFORMED
Cocaine: NOT DETECTED
Opiates: POSITIVE — AB
Tetrahydrocannabinol: NOT DETECTED

## 2010-08-10 LAB — LIPASE, BLOOD
Lipase: 40 U/L (ref 11–59)
Lipase: 47 U/L (ref 11–59)

## 2010-08-10 LAB — GLUCOSE, CAPILLARY: Glucose-Capillary: 108 mg/dL — ABNORMAL HIGH (ref 70–99)

## 2010-08-11 LAB — URINALYSIS, ROUTINE W REFLEX MICROSCOPIC
Bilirubin Urine: NEGATIVE
Glucose, UA: NEGATIVE mg/dL
Ketones, ur: NEGATIVE mg/dL
Nitrite: NEGATIVE
Specific Gravity, Urine: 1.022 (ref 1.005–1.030)
pH: 5 (ref 5.0–8.0)

## 2010-08-11 LAB — DIFFERENTIAL
Basophils Absolute: 0.1 10*3/uL (ref 0.0–0.1)
Basophils Relative: 1 % (ref 0–1)
Eosinophils Absolute: 0.2 10*3/uL (ref 0.0–0.7)
Eosinophils Relative: 2 % (ref 0–5)
Neutrophils Relative %: 60 % (ref 43–77)

## 2010-08-11 LAB — CBC
HCT: 46.3 % (ref 39.0–52.0)
Hemoglobin: 16.3 g/dL (ref 13.0–17.0)
MCHC: 35.2 g/dL (ref 30.0–36.0)
RBC: 5.12 MIL/uL (ref 4.22–5.81)

## 2010-08-11 LAB — LIPASE, BLOOD: Lipase: 41 U/L (ref 11–59)

## 2010-08-11 LAB — COMPREHENSIVE METABOLIC PANEL
ALT: 27 U/L (ref 0–53)
AST: 23 U/L (ref 0–37)
Alkaline Phosphatase: 65 U/L (ref 39–117)
CO2: 23 mEq/L (ref 19–32)
Chloride: 107 mEq/L (ref 96–112)
GFR calc Af Amer: >60 mL/min (ref >60)
GFR calc non Af Amer: >60 mL/min (ref >60)
Glucose, Bld: 91 mg/dL (ref 70–99)
Sodium: 140 mEq/L (ref 135–145)
Total Bilirubin: 0.8 mg/dL (ref 0.3–1.2)

## 2010-08-13 LAB — DIFFERENTIAL
Basophils Relative: 1 % (ref 0–1)
Eosinophils Absolute: 0.2 10*3/uL (ref 0.0–0.7)
Eosinophils Absolute: 0.4 10*3/uL (ref 0.0–0.7)
Lymphs Abs: 2.2 10*3/uL (ref 0.7–4.0)
Monocytes Absolute: 0.7 10*3/uL (ref 0.1–1.0)
Monocytes Relative: 8 % (ref 3–12)
Monocytes Relative: 9 % (ref 3–12)
Neutro Abs: 6.5 10*3/uL (ref 1.7–7.7)
Neutrophils Relative %: 67 % (ref 43–77)
Neutrophils Relative %: 56 % (ref 43–77)

## 2010-08-13 LAB — URINALYSIS, ROUTINE W REFLEX MICROSCOPIC
Glucose, UA: NEGATIVE mg/dL
Hgb urine dipstick: NEGATIVE
pH: 5.5 (ref 5.0–8.0)

## 2010-08-13 LAB — COMPREHENSIVE METABOLIC PANEL
ALT: 40 U/L (ref 0–53)
ALT: 29 U/L (ref 0–53)
Albumin: 4.3 g/dL (ref 3.5–5.2)
Alkaline Phosphatase: 68 U/L (ref 39–117)
CO2: 29 mEq/L (ref 19–32)
Calcium: 9.5 mg/dL (ref 8.4–10.5)
Chloride: 104 mEq/L (ref 96–112)
GFR calc Af Amer: >60 mL/min (ref >60)
GFR calc non Af Amer: >60 mL/min (ref >60)
Glucose, Bld: 100 mg/dL — ABNORMAL HIGH (ref 70–99)
Glucose, Bld: 103 mg/dL — ABNORMAL HIGH (ref 70–99)
Potassium: 4 mEq/L (ref 3.5–5.1)
Sodium: 137 mEq/L (ref 135–145)
Sodium: 140 mEq/L (ref 135–145)
Total Bilirubin: 0.2 mg/dL — ABNORMAL LOW (ref 0.3–1.2)
Total Protein: 7.6 g/dL (ref 6.0–8.3)

## 2010-08-13 LAB — CBC
HCT: 50 % (ref 39.0–52.0)
Hemoglobin: 17.1 g/dL — ABNORMAL HIGH (ref 13.0–17.0)
Hemoglobin: 17 g/dL (ref 13.0–17.0)
MCHC: 34.5 g/dL (ref 30.0–36.0)
Platelets: 247 10*3/uL (ref 150–400)
RDW: 12.7 % (ref 11.5–15.5)
WBC: 10.9 10*3/uL — ABNORMAL HIGH (ref 4.0–10.5)

## 2010-08-13 LAB — POCT HEMOGLOBIN-HEMACUE: Hemoglobin: 15.4 g/dL (ref 13.0–17.0)

## 2010-08-13 LAB — PROTIME-INR
INR: 0.91 (ref 0.00–1.49)
Prothrombin Time: 12.2 seconds (ref 11.6–15.2)

## 2010-08-14 ENCOUNTER — Emergency Department (HOSPITAL_COMMUNITY): Payer: BC Managed Care – PPO

## 2010-08-14 ENCOUNTER — Emergency Department (HOSPITAL_COMMUNITY)
Admission: EM | Admit: 2010-08-14 | Discharge: 2010-08-14 | Disposition: A | Discharge status: Home / Self Care | Payer: BC Managed Care – PPO | Attending: Emergency Medicine | Admitting: Emergency Medicine

## 2010-08-14 DIAGNOSIS — R110 Nausea: Secondary | ICD-10-CM | POA: Insufficient documentation

## 2010-08-14 DIAGNOSIS — Z87442 Personal history of urinary calculi: Secondary | ICD-10-CM | POA: Insufficient documentation

## 2010-08-14 DIAGNOSIS — Z9089 Acquired absence of other organs: Secondary | ICD-10-CM | POA: Insufficient documentation

## 2010-08-14 DIAGNOSIS — R109 Unspecified abdominal pain: Secondary | ICD-10-CM | POA: Insufficient documentation

## 2010-08-14 LAB — DIFFERENTIAL
Eosinophils Absolute: 0.2 10*3/uL (ref 0.0–0.7)
Eosinophils Relative: 2 % (ref 0–5)
Eosinophils Relative: 3 % (ref 0–5)
Lymphocytes Relative: 31 % (ref 12–46)
Lymphs Abs: 2 10*3/uL (ref 0.7–4.0)
Monocytes Absolute: 1.1 10*3/uL — ABNORMAL HIGH (ref 0.1–1.0)
Monocytes Relative: 10 % (ref 3–12)
Neutro Abs: 5.6 10*3/uL (ref 1.7–7.7)

## 2010-08-14 LAB — URINALYSIS, ROUTINE W REFLEX MICROSCOPIC
Bilirubin Urine: NEGATIVE
Glucose, UA: NEGATIVE mg/dL
Glucose, UA: NEGATIVE mg/dL
Hgb urine dipstick: NEGATIVE
Hgb urine dipstick: NEGATIVE
Ketones, ur: NEGATIVE mg/dL
Nitrite: NEGATIVE
Protein, ur: NEGATIVE mg/dL
Protein, ur: NEGATIVE mg/dL
Specific Gravity, Urine: 1.011 (ref 1.005–1.030)
Specific Gravity, Urine: 1.018 (ref 1.005–1.030)
Urobilinogen, UA: 0.2 mg/dL (ref 0.0–1.0)
Urobilinogen, UA: 0.2 mg/dL (ref 0.0–1.0)

## 2010-08-14 LAB — COMPREHENSIVE METABOLIC PANEL
AST: 30 U/L (ref 0–37)
Albumin: 4.2 g/dL (ref 3.5–5.2)
Alkaline Phosphatase: 54 U/L (ref 39–117)
BUN: 12 mg/dL (ref 6–23)
BUN: 11 mg/dL (ref 6–23)
Calcium: 9.7 mg/dL (ref 8.4–10.5)
Chloride: 107 mEq/L (ref 96–112)
Creatinine, Ser: 0.83 mg/dL (ref 0.4–1.5)
Creatinine, Ser: 0.77 mg/dL (ref 0.4–1.5)
GFR calc Af Amer: >60 mL/min (ref >60)
Glucose, Bld: 102 mg/dL — ABNORMAL HIGH (ref 70–99)
Potassium: 3.3 mEq/L — ABNORMAL LOW (ref 3.5–5.1)
Total Protein: 7.4 g/dL (ref 6.0–8.3)
Total Protein: 7.2 g/dL (ref 6.0–8.3)

## 2010-08-14 LAB — CBC
HCT: 44.5 % (ref 39.0–52.0)
MCH: 30.9 pg (ref 26.0–34.0)
MCHC: 34.6 g/dL (ref 30.0–36.0)
MCV: 89.6 fL (ref 78.0–100.0)
Platelets: 193 10*3/uL (ref 150–400)
Platelets: 236 10*3/uL (ref 150–400)
RDW: 12.2 % (ref 11.5–15.5)
RDW: 12.9 % (ref 11.5–15.5)
WBC: 9.7 10*3/uL (ref 4.0–10.5)
WBC: 10.3 10*3/uL (ref 4.0–10.5)

## 2010-08-14 LAB — POCT I-STAT, CHEM 8
Calcium, Ion: 1.18 mmol/L (ref 1.12–1.32)
Chloride: 104 mEq/L (ref 96–112)
Glucose, Bld: 102 mg/dL — ABNORMAL HIGH (ref 70–99)
HCT: 47 % (ref 39.0–52.0)

## 2010-08-15 LAB — COMPREHENSIVE METABOLIC PANEL
AST: 20 U/L (ref 0–37)
BUN: 10 mg/dL (ref 6–23)
CO2: 24 mEq/L (ref 19–32)
Chloride: 108 mEq/L (ref 96–112)
Creatinine, Ser: 0.78 mg/dL (ref 0.4–1.5)
GFR calc non Af Amer: >60 mL/min (ref >60)
Glucose, Bld: 94 mg/dL (ref 70–99)
Total Bilirubin: 0.8 mg/dL (ref 0.3–1.2)

## 2010-08-15 LAB — DIFFERENTIAL
Basophils Absolute: 0.1 10*3/uL (ref 0.0–0.1)
Eosinophils Relative: 3 % (ref 0–5)
Lymphocytes Relative: 30 % (ref 12–46)
Neutrophils Relative %: 53 % (ref 43–77)

## 2010-08-15 LAB — CBC
HCT: 43.6 % (ref 39.0–52.0)
Hemoglobin: 14.9 g/dL (ref 13.0–17.0)
MCV: 91.5 fL (ref 78.0–100.0)
RBC: 4.77 MIL/uL (ref 4.22–5.81)
WBC: 10.2 10*3/uL (ref 4.0–10.5)

## 2010-08-15 LAB — LIPASE, BLOOD: Lipase: 54 U/L (ref 11–59)

## 2010-08-16 LAB — URINALYSIS, ROUTINE W REFLEX MICROSCOPIC
Bilirubin Urine: NEGATIVE
Ketones, ur: NEGATIVE mg/dL
Nitrite: NEGATIVE
Specific Gravity, Urine: 1.007 (ref 1.005–1.030)
Urobilinogen, UA: 0.2 mg/dL (ref 0.0–1.0)

## 2010-08-17 ENCOUNTER — Emergency Department (HOSPITAL_COMMUNITY)
Admission: EM | Admit: 2010-08-17 | Discharge: 2010-08-17 | Disposition: A | Discharge status: Home / Self Care | Payer: BC Managed Care – PPO | Attending: Emergency Medicine | Admitting: Emergency Medicine

## 2010-08-17 DIAGNOSIS — R110 Nausea: Secondary | ICD-10-CM | POA: Insufficient documentation

## 2010-08-17 DIAGNOSIS — R10812 Left upper quadrant abdominal tenderness: Secondary | ICD-10-CM | POA: Insufficient documentation

## 2010-08-17 DIAGNOSIS — R109 Unspecified abdominal pain: Secondary | ICD-10-CM | POA: Insufficient documentation

## 2010-08-17 LAB — POCT I-STAT, CHEM 8
Creatinine, Ser: 1 mg/dL (ref 0.4–1.5)
Glucose, Bld: 115 mg/dL — ABNORMAL HIGH (ref 70–99)
Hemoglobin: 17 g/dL (ref 13.0–17.0)
Sodium: 142 mEq/L (ref 135–145)
TCO2: 25 mmol/L (ref 0–100)

## 2010-08-28 LAB — CBC
MCHC: 34.8 g/dL (ref 30.0–36.0)
MCV: 89.9 fL (ref 78.0–100.0)
Platelets: 221 10*3/uL (ref 150–400)
RDW: 13.2 % (ref 11.5–15.5)

## 2010-08-28 LAB — DIFFERENTIAL
Eosinophils Relative: 3 % (ref 0–5)
Lymphocytes Relative: 40 % (ref 12–46)
Lymphs Abs: 4 10*3/uL (ref 0.7–4.0)
Monocytes Absolute: 1 10*3/uL (ref 0.1–1.0)

## 2010-08-28 LAB — COMPREHENSIVE METABOLIC PANEL
AST: 21 U/L (ref 0–37)
Albumin: 4.3 g/dL (ref 3.5–5.2)
Calcium: 9.2 mg/dL (ref 8.4–10.5)
Creatinine, Ser: 0.83 mg/dL (ref 0.4–1.5)
GFR calc Af Amer: >60 mL/min (ref >60)

## 2010-08-28 LAB — LIPASE, BLOOD: Lipase: 52 U/L (ref 11–59)

## 2010-08-29 LAB — DIFFERENTIAL
Eosinophils Absolute: 0.1 10*3/uL (ref 0.0–0.7)
Eosinophils Relative: 1 % (ref 0–5)
Lymphs Abs: 2.3 10*3/uL (ref 0.7–4.0)
Monocytes Absolute: 0.8 10*3/uL (ref 0.1–1.0)
Monocytes Relative: 9 % (ref 3–12)

## 2010-08-29 LAB — CBC
HCT: 43.8 % (ref 39.0–52.0)
Hemoglobin: 15.3 g/dL (ref 13.0–17.0)
MCV: 90.3 fL (ref 78.0–100.0)
Platelets: 278 10*3/uL (ref 150–400)
WBC: 9.2 10*3/uL (ref 4.0–10.5)

## 2010-08-29 LAB — BASIC METABOLIC PANEL
BUN: 9 mg/dL (ref 6–23)
Chloride: 107 mEq/L (ref 96–112)
Potassium: 3.4 mEq/L — ABNORMAL LOW (ref 3.5–5.1)
Sodium: 140 mEq/L (ref 135–145)

## 2010-08-29 LAB — URINALYSIS, ROUTINE W REFLEX MICROSCOPIC
Bilirubin Urine: NEGATIVE
Glucose, UA: NEGATIVE mg/dL
Hgb urine dipstick: NEGATIVE
Ketones, ur: NEGATIVE mg/dL
Protein, ur: NEGATIVE mg/dL
pH: 6 (ref 5.0–8.0)

## 2010-09-02 LAB — CBC
HCT: 48.6 % (ref 39.0–52.0)
Hemoglobin: 16.8 g/dL (ref 13.0–17.0)
MCHC: 34.7 g/dL (ref 30.0–36.0)
RBC: 5.39 MIL/uL (ref 4.22–5.81)
RDW: 12.6 % (ref 11.5–15.5)

## 2010-09-02 LAB — COMPREHENSIVE METABOLIC PANEL
ALT: 32 U/L (ref 0–53)
Alkaline Phosphatase: 67 U/L (ref 39–117)
BUN: 7 mg/dL (ref 6–23)
CO2: 25 mEq/L (ref 19–32)
Calcium: 9.6 mg/dL (ref 8.4–10.5)
GFR calc non Af Amer: >60 mL/min (ref >60)
Glucose, Bld: 86 mg/dL (ref 70–99)
Total Protein: 7.3 g/dL (ref 6.0–8.3)

## 2010-09-02 LAB — DIFFERENTIAL
Basophils Absolute: 0 10*3/uL (ref 0.0–0.1)
Eosinophils Absolute: 0.3 10*3/uL (ref 0.0–0.7)
Lymphocytes Relative: 31 % (ref 12–46)
Lymphs Abs: 3.2 10*3/uL (ref 0.7–4.0)
Neutrophils Relative %: 57 % (ref 43–77)

## 2010-09-02 LAB — URINALYSIS, ROUTINE W REFLEX MICROSCOPIC
Bilirubin Urine: NEGATIVE
Glucose, UA: NEGATIVE mg/dL
Hgb urine dipstick: NEGATIVE
Ketones, ur: NEGATIVE mg/dL
Specific Gravity, Urine: 1.006 (ref 1.005–1.030)
pH: 5.5 (ref 5.0–8.0)

## 2010-09-02 LAB — LIPASE, BLOOD: Lipase: 46 U/L (ref 11–59)

## 2010-09-04 LAB — URINALYSIS, ROUTINE W REFLEX MICROSCOPIC
Bilirubin Urine: NEGATIVE
Ketones, ur: NEGATIVE mg/dL
Nitrite: NEGATIVE
Urobilinogen, UA: 0.2 mg/dL (ref 0.0–1.0)
pH: 5 (ref 5.0–8.0)

## 2010-09-04 LAB — POCT I-STAT, CHEM 8
BUN: 14 mg/dL (ref 6–23)
Calcium, Ion: 1.18 mmol/L (ref 1.12–1.32)
Chloride: 107 mEq/L (ref 96–112)
HCT: 49 % (ref 39.0–52.0)
Potassium: 3.8 mEq/L (ref 3.5–5.1)
Sodium: 141 mEq/L (ref 135–145)

## 2010-09-04 LAB — CBC
Hemoglobin: 16.9 g/dL (ref 13.0–17.0)
MCHC: 34.2 g/dL (ref 30.0–36.0)
Platelets: 235 10*3/uL (ref 150–400)
RDW: 12.8 % (ref 11.5–15.5)

## 2010-09-04 LAB — DIFFERENTIAL
Basophils Absolute: 0.1 10*3/uL (ref 0.0–0.1)
Basophils Relative: 1 % (ref 0–1)
Lymphocytes Relative: 29 % (ref 12–46)
Monocytes Absolute: 0.7 10*3/uL (ref 0.1–1.0)
Neutro Abs: 5.7 10*3/uL (ref 1.7–7.7)
Neutrophils Relative %: 62 % (ref 43–77)

## 2010-09-05 LAB — BASIC METABOLIC PANEL
CO2: 24 mEq/L (ref 19–32)
Chloride: 107 mEq/L (ref 96–112)
Creatinine, Ser: 0.75 mg/dL (ref 0.4–1.5)
GFR calc Af Amer: >60 mL/min (ref >60)
Sodium: 139 mEq/L (ref 135–145)

## 2010-09-05 LAB — URINALYSIS, ROUTINE W REFLEX MICROSCOPIC
Glucose, UA: NEGATIVE mg/dL
Hgb urine dipstick: NEGATIVE
Protein, ur: NEGATIVE mg/dL
Specific Gravity, Urine: 1.009 (ref 1.005–1.030)
Urobilinogen, UA: 0.2 mg/dL (ref 0.0–1.0)

## 2010-09-06 LAB — DIFFERENTIAL
Basophils Absolute: 0 10*3/uL (ref 0.0–0.1)
Basophils Relative: 0 % (ref 0–1)
Eosinophils Absolute: 0.1 10*3/uL (ref 0.0–0.7)
Eosinophils Relative: 2 % (ref 0–5)
Monocytes Absolute: 1 10*3/uL (ref 0.1–1.0)

## 2010-09-06 LAB — CBC
HCT: 43.3 % (ref 39.0–52.0)
Hemoglobin: 15.3 g/dL (ref 13.0–17.0)
MCHC: 35.3 g/dL (ref 30.0–36.0)
MCV: 90.9 fL (ref 78.0–100.0)
Platelets: 224 10*3/uL (ref 150–400)
RBC: 4.76 MIL/uL (ref 4.22–5.81)
RDW: 12.8 % (ref 11.5–15.5)
WBC: 7.6 10*3/uL (ref 4.0–10.5)

## 2010-09-06 LAB — COMPREHENSIVE METABOLIC PANEL
ALT: 34 U/L (ref 0–53)
AST: 25 U/L (ref 0–37)
Albumin: 3.9 g/dL (ref 3.5–5.2)
Alkaline Phosphatase: 72 U/L (ref 39–117)
BUN: 8 mg/dL (ref 6–23)
CO2: 23 mEq/L (ref 19–32)
Calcium: 9.3 mg/dL (ref 8.4–10.5)
Chloride: 110 mEq/L (ref 96–112)
Creatinine, Ser: 0.75 mg/dL (ref 0.4–1.5)
GFR calc Af Amer: >60 mL/min (ref >60)
GFR calc non Af Amer: >60 mL/min (ref >60)
Glucose, Bld: 100 mg/dL — ABNORMAL HIGH (ref 70–99)
Potassium: 3.4 mEq/L — ABNORMAL LOW (ref 3.5–5.1)
Sodium: 138 mEq/L (ref 135–145)
Total Bilirubin: 0.5 mg/dL (ref 0.3–1.2)
Total Protein: 6.6 g/dL (ref 6.0–8.3)

## 2010-09-06 LAB — LIPASE, BLOOD: Lipase: 29 U/L (ref 11–59)

## 2010-09-06 LAB — LACTIC ACID, PLASMA: Lactic Acid, Venous: 1.1 mmol/L (ref 0.5–2.2)

## 2010-09-10 ENCOUNTER — Emergency Department (HOSPITAL_COMMUNITY)
Admission: EM | Admit: 2010-09-10 | Discharge: 2010-09-10 | Disposition: A | Discharge status: Home / Self Care | Payer: BC Managed Care – PPO | Attending: Emergency Medicine | Admitting: Emergency Medicine

## 2010-09-10 DIAGNOSIS — K861 Other chronic pancreatitis: Secondary | ICD-10-CM | POA: Insufficient documentation

## 2010-09-10 DIAGNOSIS — R112 Nausea with vomiting, unspecified: Secondary | ICD-10-CM | POA: Insufficient documentation

## 2010-09-10 DIAGNOSIS — Z9089 Acquired absence of other organs: Secondary | ICD-10-CM | POA: Insufficient documentation

## 2010-09-10 DIAGNOSIS — R000 Tachycardia, unspecified: Secondary | ICD-10-CM | POA: Insufficient documentation

## 2010-09-10 DIAGNOSIS — Z87442 Personal history of urinary calculi: Secondary | ICD-10-CM | POA: Insufficient documentation

## 2010-09-10 DIAGNOSIS — R109 Unspecified abdominal pain: Secondary | ICD-10-CM | POA: Insufficient documentation

## 2010-09-10 DIAGNOSIS — F17200 Nicotine dependence, unspecified, uncomplicated: Secondary | ICD-10-CM | POA: Insufficient documentation

## 2010-09-10 LAB — CBC
MCH: 31.3 pg (ref 26.0–34.0)
MCHC: 36.3 g/dL — ABNORMAL HIGH (ref 30.0–36.0)
MCV: 86.2 fL (ref 78.0–100.0)
Platelets: 271 10*3/uL (ref 150–400)
RBC: 5.88 MIL/uL — ABNORMAL HIGH (ref 4.22–5.81)
RDW: 12.5 % (ref 11.5–15.5)

## 2010-09-10 LAB — DIFFERENTIAL
Basophils Relative: 0 % (ref 0–1)
Eosinophils Absolute: 0.3 10*3/uL (ref 0.0–0.7)
Lymphs Abs: 3.5 10*3/uL (ref 0.7–4.0)
Monocytes Absolute: 1.8 10*3/uL — ABNORMAL HIGH (ref 0.1–1.0)
Neutro Abs: 11.1 10*3/uL — ABNORMAL HIGH (ref 1.7–7.7)

## 2010-09-10 LAB — COMPREHENSIVE METABOLIC PANEL
Albumin: 4.7 g/dL (ref 3.5–5.2)
BUN: 14 mg/dL (ref 6–23)
Calcium: 10.5 mg/dL (ref 8.4–10.5)
Chloride: 107 mEq/L (ref 96–112)
Creatinine, Ser: 1.02 mg/dL (ref 0.4–1.5)
GFR calc non Af Amer: >60 mL/min (ref >60)
Total Bilirubin: 0.9 mg/dL (ref 0.3–1.2)

## 2010-09-10 LAB — URINALYSIS, ROUTINE W REFLEX MICROSCOPIC
Glucose, UA: NEGATIVE mg/dL
Protein, ur: NEGATIVE mg/dL
Urobilinogen, UA: 0.2 mg/dL (ref 0.0–1.0)

## 2010-09-27 ENCOUNTER — Emergency Department (HOSPITAL_COMMUNITY)
Admission: EM | Admit: 2010-09-27 | Discharge: 2010-09-27 | Disposition: A | Discharge status: Home / Self Care | Payer: BC Managed Care – PPO | Attending: Emergency Medicine | Admitting: Emergency Medicine

## 2010-09-27 DIAGNOSIS — F17200 Nicotine dependence, unspecified, uncomplicated: Secondary | ICD-10-CM | POA: Insufficient documentation

## 2010-09-27 DIAGNOSIS — R112 Nausea with vomiting, unspecified: Secondary | ICD-10-CM | POA: Insufficient documentation

## 2010-09-27 DIAGNOSIS — Z87442 Personal history of urinary calculi: Secondary | ICD-10-CM | POA: Insufficient documentation

## 2010-09-27 DIAGNOSIS — R000 Tachycardia, unspecified: Secondary | ICD-10-CM | POA: Insufficient documentation

## 2010-09-27 DIAGNOSIS — R109 Unspecified abdominal pain: Secondary | ICD-10-CM | POA: Insufficient documentation

## 2010-09-27 LAB — URINALYSIS, ROUTINE W REFLEX MICROSCOPIC
Nitrite: NEGATIVE
Specific Gravity, Urine: 1.014 (ref 1.005–1.030)
pH: 5 (ref 5.0–8.0)

## 2010-09-27 LAB — COMPREHENSIVE METABOLIC PANEL
ALT: 41 U/L (ref 0–53)
Albumin: 4.3 g/dL (ref 3.5–5.2)
Alkaline Phosphatase: 69 U/L (ref 39–117)
BUN: 11 mg/dL (ref 6–23)
Potassium: 3.7 mEq/L (ref 3.5–5.1)
Sodium: 140 mEq/L (ref 135–145)
Total Protein: 7.8 g/dL (ref 6.0–8.3)

## 2010-09-27 LAB — CBC
Platelets: 234 10*3/uL (ref 150–400)
RBC: 4.99 MIL/uL (ref 4.22–5.81)
WBC: 9.8 10*3/uL (ref 4.0–10.5)

## 2010-09-27 LAB — DIFFERENTIAL
Basophils Relative: 1 % (ref 0–1)
Eosinophils Absolute: 0.2 10*3/uL (ref 0.0–0.7)
Neutrophils Relative %: 46 % (ref 43–77)

## 2010-10-10 NOTE — Consult Note (Signed)
NAME:  [name], [name]                 ACCOUNT NO.:  [id number]   MEDICAL RECORD NO.:  [id number]          PATIENT TYPE:  EMS   LOCATION:  ED                           FACILITY:  [facility]   PHYSICIAN:  Hillery Aldo, M.D.   DATE OF BIRTH:  01-14-1976   DATE OF CONSULTATION:  03/18/2008  DATE OF DISCHARGE:                                 CONSULTATION   PRIMARY CARE PHYSICIAN:  Scott A. Gerda Diss, MD.   GASTROENTEROLOGIST:  Rachael Fee, MD.   CHIEF COMPLAINT:  Abdominal pain in the right upper quadrant.   HISTORY OF PRESENT ILLNESS:  The patient is a 35 year old male with a  past medical history of chronic right upper quadrant abdominal pain  status post multiple GI evaluations in the past who presents with a  sudden onset of nausea and vomiting this morning, followed by right  upper quadrant abdominal pain.  The patient states that this feels like  his usual pancreatitis pain and is similar in quality and location.  The  patient denies any associated hematemesis, melena, hematochezia or  diarrhea.  He has not had any fever or chills.  His appetite until today  has been fair.  He has not had any weight changes.  He currently rating  his abdominal pain at a level of 3/10 and reports that the Dilaudid  given to him by the emergency department physician is currently keeping  him comfortable.  The patient does not want to be admitted, but simply  wants a pain regimen for home use at this time.   PAST MEDICAL HISTORY:  1. Hypokalemic paralysis.  2. Recurrent nephrolithiasis status post cystoscopy and lithotripsy in      the past.  3. Chronic pancreatitis.  4. History of chronic right upper quadrant abdominal pain syndrome,      diagnosed as functional in etiology by [facility] in 2007 after an extensive evaluation.  5. 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  6. Status post biliary and pancreatic sphincterotomy and  pancreatic      stent with subsequent stent removal.  7. 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  8. Status post cholecystectomy.  9. Status post appendectomy.  10.Status post laparoscopic right knee surgery.   FAMILY HISTORY:  The patient's mother is alive at 19 and has breast  cancer.  She also has thyroid disease and hypertension.  The patient's  father is alive at 66 and is healthy.  He has one 56 year old brother  who is healthy.  He has one healthy daughter.   SOCIAL HISTORY:  The patient is married and currently works for [employer].  He has a 15-pack-year-history of tobacco use, but is currently smoking  less than a 1/4 pack a day.  Denies alcohol and drug use.   ALLERGIES:  TORADOL CAUSES HIVES.  CORTISONE CAUSES HYPOKALEMIA.   MEDICATIONS:  1. Questran 2 grams t.i.d. with meals.  2. Phenergan 25 mg p.o. or PR q.4h. p.r.n.   REVIEW OF SYSTEMS:  The patient denies headache.  His appetite is fair.  No weight changes.  No dyspnea, cough, or chest pain.  No polyuria or  polydipsia.   PHYSICAL EXAMINATION:  Temperature 98.2, pulse 100, respirations 20,  blood pressure 138/104, O2 saturation 96% on room air.  GENERAL:  Well-developed, well-nourished male in no acute distress.  HEENT:  Normocephalic, atraumatic.  PERRL.  EOMI.  Oropharynx is clear.  NECK:  Supple, no thyromegaly, no lymphadenopathy, no jugular venous  distention.  CHEST:  Lungs clear to auscultation bilaterally with good air movement.  HEART:  Regular rate, rhythm.  No murmurs, rubs or gallops.  ABDOMEN:  Soft.  There ARE hyperactive bowel sounds.  He is tender in  the right upper quadrant.  No rebound or guarding.  EXTREMITIES:  No clubbing, edema, or cyanosis.  SKIN:  Dry.  No rashes.  NEUROLOGIC:  The patient is alert and oriented times three.  Cranial  nerves II-XII are grossly intact.  Nonfocal.   DATA REVIEW:  Acute abdominal series shows no acute chest or abdominal  findings.   LABORATORY DATA:  White  blood cell count is 15.8, hemoglobin 16.6,  hematocrit 47.5, platelets 268.  Sodium is 138, potassium 3.4, chloride  105, bicarb 22, BUN 12, creatinine 0.89, glucose 98.  Liver function  studies are completely within normal limits.  Lipase is 24.  Urinalysis  negative for ketones and signs of infection.   ASSESSMENT/PLAN:  1. Acute exacerbation of chronic right upper quadrant abdominal pain      most likely represents a flare of the patient's chronic      pancreatitis.  He is not dehydrated at this time and has no ketones      in his urine or indication for current admission.  He is able to      hydrate okay.  At this time, we will discharge him home with      instructions to sip on clear liquids only and to take pain      medications and antiemetics ss needed and when his pain improves,      to slowly advance his diet.  He does have follow-up scheduled with      Dr. Christella Hartigan and understands that if his symptoms were to worsen or      if he develops intractable nausea and vomiting, he should return to      the emergency department  2. Hypokalemia:  The patient will be repleted with 40 mEq potassium      prior to discharge from  3. Leukocytosis:  The patient did not have any signs of localizing      infection.  He is nontoxic appearing.  This is likely related to      underlying inflammation from his chronic pancreatitis.  No further      diagnostic workup is indicated at this time  4. Hypertension:  The patient's diastolic blood pressure is elevated      which is likely due to his current abdominal pain.  This should      improve as his pain improves.  He should follow-up with his primary      care physician for routine surveillance   DISPOSITION:  I see no reason to keep the patient at this time.  He is  medically stable and not evidencing signs of dehydration.  He  understands that he should return to the emergency department if his  condition worsens or he should develop intractable  nausea and vomiting.  We will discharge with a prescription for Dilaudid pills  to take as  needed for pain control.  He has adequate follow-up scheduled with Dr.  Christella Hartigan and his primary care physician.      Hillery Aldo, M.D.  Electronically Signed     CR/MEDQ  D:  03/18/2008  T:  03/18/2008  Job:  [id number]   cc:   Lorin Picket A. Gerda Diss, MD  Fax: [phone]   Rachael Fee, MD  [address]  [place], [place] [address]

## 2010-10-10 NOTE — H&P (Signed)
NAMEMarland Ballard  YANDELL, MCJUNKINS                 ACCOUNT NO.:  [id number]   MEDICAL RECORD NO.:  [id number]          PATIENT TYPE:  INP   LOCATION:  0108                         FACILITY[facility]   PHYSICIAN:  Ramiro Harvest, MD    DATE OF BIRTH:  03/07/1976   DATE OF ADMISSION:  05/26/2007  DATE OF DISCHARGE:                              HISTORY & PHYSICAL   ATTENDING PHYSICIAN:  Dr. Ramiro Harvest.   PRIMARY CARE PHYSICIAN:  Dr. Joycelyn Rua.   GASTROENTEROLOGIST:  Llana Aliment. Randa Evens, M.D., or Petra Kuba, M.D., of  [facility].   HISTORY OF PRESENT ILLNESS:  [name] is a 35 year old white male  with history of cholecystectomy, appendectomy, bile duct stripping  and  pancreatic duct stenting in August 2007 at [facility] per patient,  history of nephrolithiasis who presents to the ED with several hours of  right flank pain radiating to the right testicle, sharp in nature,  decreased urinary stream, cloudy urine.  The patient denies any  hematuria or dysuria.  The patient tried Vicodin at home with no relief  and came to the ED the patient denied any fever or chills.  No emesis.  He did state that he did have some nausea.  No chest pain or  palpitations, no shortness of breath.  No melena or hematochezia.  No  hematuria.  No other associated symptoms.  In the ED, the patient was  found to have rbc's in his urine.  CT scan was done which showed a  nonobstructive kidney stone of 6 mm and gas in the biliary tree.  Lipase  was checked which was found to be 528.  We were called for admission for  further evaluation and treatment.  The patient also had two episodes of  emesis while he was in the ED.   ALLERGIES:  CORTISONE causes hypokalemia. TORADOL causes hives.   PAST MEDICAL HISTORY:  1. Status post cholecystectomy 1997.  2. Status post appendectomy 1998.  3. Status post knee surgery on the right knee.  4. History of back problems.  5. History of hypokalemic  paralysis, no reoccurrence in a few years.  6. History of renal calculi.  7. Bile duct stripping  and a stent to the pancreas status post      removal August 2008 at [facility] -  per patient report.   HOME MEDICATIONS:  1. Vicodin 5/500 p.r.n.  2. Multivitamin 1 tablet daily.   SOCIAL HISTORY:  The patient lives in [place], [place], with  his wife and his daughter. He has a seven-pack-year tobacco history,  social alcohol use.  No IV drug use.   FAMILY HISTORY:  Mother alive at age 26, history of hypertension and  thyroid disease.  Father alive at age 52 and healthy.  One brother, age  32 and healthy.   REVIEW OF SYSTEMS:  As per HPI, otherwise negative.   PHYSICAL EXAMINATION:  VITAL SIGNS:  Temperature 97.9, blood pressure  148/105 going to 140/84, pulse of 97, respiratory rate 27, 98% on room  air.  GENERAL:  Patient lying in bed.  HEENT:  Normocephalic, atraumatic.  Pupils equal, round and reactive to  light.  Extraocular movements intact.  Oropharynx clear and moist, no  lesions.  NECK:  Supple.  No lymphadenopathy.  RESPIRATORY:  Lungs were clear to auscultation bilaterally.  No wheezes,  no rhonchi.  No crackles.  CARDIOVASCULAR:  Regular rate and rhythm.  No murmurs, rubs, gallops.  ABDOMEN:  Soft, nontender, nondistended.  Positive bowel sounds.  EXTREMITIES:  No clubbing, cyanosis or edema.  NEUROLOGIC:  The patient is alert and oriented x3.  Cranial nerves II-  XII grossly intact.  No focal deficits.  Cerebellum is intact.  Sensation is intact.  Gait not tested.   LABORATORY DATA:  Lipase 528.  Sodium 137, potassium 3.8, chloride 105,  bicarb 23, BUN 6, creatinine 0.74, glucose 111, calcium 9.4.  CBC with  white count 11.9, hemoglobin 16.7, platelets 232, ANC 6.6.  Urobilinogen  0.2, nitrite negative, leukocytes negative.  This is the UA, which is  yellow and clear, specific gravity of 1.028, pH of 5.5, glucose  negative, bilirubin negative, ketones  negative, blood large, protein  negative.  Micro 21-50 RBCs, bacteria rare.   CT of abdomen and pelvis per ED report:  A 6 mm right renal stone, no  urethral stone obstruction, gas in the biliary tree.   ASSESSMENT AND PLAN:  [name] is a 35 year old with history of  cholecystectomy, status post appendectomy history of pancreatic duct  stent and bile duct stripping at [facility] in August 2007,  history of nephrolithiasis who presents to the ED with right flank pain  radiating to the right testicle and found to have elevated lipase and  gas in the biliary tree per CT.   PROBLEMS:  1. Acute pancreatitis.  Differential includes gallstones, pancreatitis      versus alcoholic versus hypertriglyceridemia versus infectious      versus ischemic etiology.  Will hydrate with IV fluids, analgesia,      bowel rest, CT with gas in the biliary tree preliminary reading.      Will await final results, will check amylase, check a hepatic      panel.  Check Coags, check LDH, check a fasting lipid panel, check      blood cultures x2, will cover empirically with Unasyn and will get      a GI consultation for further recommendations and management.  2. Gas in the biliary tree, per preliminary CT report questionable      etiology.  We will check her blood cultures x2.  We will cover      empirically with Unasyn and get a GI consult for further      recommendations and management.  3. Nephrolithiasis, nonobstructive per CT.  Check a urine sediment,      check a urine culture, check a urine cystine, check a uric acid      level, hydrate with IV fluids, analgesia and follow for stone      passage.  4. Leukocytosis, questionable etiology.  Will check blood cultures x2.      Urinalysis was already done and did not show any signs of      infection.  We will check a chest x-ray as well.  We will cover the      patient with Unasyn      empirically until culture results return.  5. Prophylaxis.   Protonix for gastrointestinal prophylaxis and SCDs      for deep venous thrombosis prophylaxis.   It  has been a pleasure taking care of Mr. Lax.      Ramiro Harvest, MD  Electronically Signed     DT/MEDQ  D:  05/26/2007  T:  05/26/2007  Job:  [id number]

## 2010-10-10 NOTE — Assessment & Plan Note (Signed)
NAMEMarland Ballard  BRONTE, KROPF                  CHART#:  [id number]   DATE:  01/19/2008                       DOB:  1976/05/09   REQUESTING PHYSICIAN:  Lorin Picket A. Gerda Diss, MD   PRIMARY GASTROENTEROLOGIST:  Jonathon Bellows, MD   CHIEF COMPLAINT:  Right upper quadrant abdominal pain, nausea, and  vomiting.   PROBLEM LIST:  1. History of chronic right upper quadrant abdominal pain syndrome      felt to be functional abdominal pain evaluated at [facility] back in 2007.  2. He had repeat EGD with side scope done sometime in 2008.  We do not      have records to review ampullectomy site for a biliary adenoma      history.  3. Biliary and pancreatic sphincterotomy with pancreatic stent      placement via ERCP for presumed sphincter of Oddi dysfunction.  4. Status post cholecystectomy.  5. Status post appendectomy.   SUBJECTIVE:  The patient is a 35 year old Caucasian male.  He has  history as above.  Over the last 4 weeks, he has noted intermittent  right upper quadrant pain along with nausea and vomiting.  He denies any  heartburn, indigestion, dysphagia, or odynophagia.  He rates the pain  5/10 on pain scale.  Pain does not radiate to his back, ache lasting  over from hours to all day long.  He describes the pain as sharp.  Denies any fever or chills.  He is having 2-3 loose stools per day.  He  denies rectal bleeding or melena.  Denies any mucus in his stools.  His  weight is up 11 pounds since he was seen 2 years ago here.  He complains  of bloating at times, but usually resolves within a couple of days.   He had abdominal ultrasound on December 06, 2007.  He was found to have mild  intra and extra hepatic biliary duct dilatation secondary to prior  cholecystectomy, common bile duct and the liver consistent with prior  bile duct surgery.  No significant changes.  CT May 26, 2007,  evaluation of the pancreas was not complete on that exam.   CURRENT  MEDICATIONS:  Questran 4 g t.i.d. and Tylenol p.r.n.   ALLERGIES:  Cortisone and Toradol.   FAMILY HISTORY:  There is no known family history of colorectal  carcinoma or recurrent GI problems.   SOCIAL HISTORY:  The patient is married.  He has one healthy daughter.  He is employed with [employer] full-time.  He has a 15-pack year history of  tobacco use, rarely consumes alcohol.  Denies any drug use.   REVIEW OF SYSTEMS:  See HPI, otherwise negative.   PHYSICAL EXAMINATION:  VITAL SIGNS:  Weight 217 pounds, height 73  inches, temperature 96, blood pressure 130/80, and pulse 60.  GENERAL:  He is a well-developed, well-nourished Caucasian male in no  acute distress.  HEENT:  Sclerae clear, nonicteric.  Conjunctivae are pink.  Oropharynx  pink and moist without any lesions.  NECK:  Supple without any masses or thyromegaly.  CHEST:  Heart regular rate and rhythm.  Normal S1 and S2 without  murmurs, clicks, rubs, or gallops.  ABDOMEN:  Protuberant with positive bowel sounds x4.  No bruits  auscultated.  Soft,  nontender, nondistended without palpable mass or  hepatosplenomegaly.  No rebound, tenderness, or guarding.  Exam is  limited given the patient's body habitus.  EXTREMITIES:  Without  clubbing or edema.   LABORATORY STUDIES:  He had CBC on December 26, 2007, which showed white  blood cell count of 14.4, hemoglobin 16.3, hematocrit 47.3, platelets  268, and normal MET-7.  He had normal LFTs, normal amylase and lipase.   IMPRESSION:  The patient is a 35 year old Caucasian male.  He has  history of intermittent right upper quadrant pain with past history of  ampullectomy for ampullary adenoma as well as biliary and pancreatic  sphincterotomy with history of sphincter of Oddi dysfunction.  He is  also felt to have an overlying functional abdominal pain syndrome as  well.   He also has been noted to have leukocytosis on recent CBC.  We need to  be sure we are not dealing with  diverticulitis and I suspect this is not  the case.   PLAN:  1. We will repeat CBC.  2. Bentyl 10 mg a.c. and h.s., #60 with one refill.  3. We will request last EGD report from [facility], Dr. Marilynne Drivers.  4. We will follow up pending results.      [name], N.P.  Electronically Signed     Kassie Mends, M.D.  Electronically Signed   KJ/MEDQ  D:  01/19/2008  T:  01/20/2008  Job:  [id number]   cc:   Lorin Picket A. Gerda Diss, MD

## 2010-10-10 NOTE — Discharge Summary (Signed)
NAMEMarland Kitchen  [name]                 ACCOUNT NO.:  [id number]   MEDICAL RECORD NO.:  [id number]          PATIENT TYPE:  INP   LOCATION:  1503                         FACILITY:  [facility]   PHYSICIAN:  Leonard Harvest, MD    DATE OF BIRTH:  10-Oct-1975   DATE OF ADMISSION:  05/26/2007  DATE OF DISCHARGE:  05/27/2007                               DISCHARGE SUMMARY   DISCHARGE DIAGNOSES:  1. Nephrolithiasis.  2. Chemical pancreatitis.  3. Leukocytosis.  4. History of hypokalemic paralysis.  5. History of recurrent renal calculi.  6. Status post right knee surgery.  7. Status post cholecystectomy.  8. Status post appendectomy.   DISCHARGE MEDICATIONS:  1. Vicodin 5/500 one tablet q. 5 hours p.r.n. take as previously.  2. Multivitamin daily.   DISPOSITION:  On follow-up, the patient will be discharged home.  The  patient is to call to schedule a follow-up appointment with the primary  care physician in 2 weeks.  On follow-up, PCP will need to reassess  patient's.  The patient is to follow-up [facility] to get an  amylase and lipase checked on this Friday, May 30, 2007.  The patient  is also to call to schedule a follow-up appointment with Dr. Randa Evens of  [facility] in 1-2 weeks.   PROCEDURES PERFORMED:  CT of the abdomen and pelvis were performed on  May 26, 2007 which showed a 6-mm right renal stone, no urethral  stone or obstruction, gas in the biliary tree.   CONSULTATIONS:  A Gastroenterology consult was made on May 26, 2007.  The patient was seen by Dr. Matthias Hughs.   BRIEF ADMISSION HISTORY AND PHYSICAL:  [name] is a 35 year old  white male with history of cholecystectomy, appendectomy, bile duct and  pancreatic duct stent in August 2007 at [facility], history of  nephrolithiasis who had presented to the ED with several hours of right  flank pain radiating to the right testicle, was sharp in nature,  decreased urinary stream to  thick, cloudy urine.  No hematuria, no  dysuria.  The patient tried some Vicodin with no relief, came to the ED.  The patient denied fever, chills.  No emesis.  No chest palpitation.  No  shortness of breath.  The patient does endorse nausea and no other  associated symptoms.  In the ED, the patient was found to have RBCs in  his urine.  CT scan was done that showed a nonobstructive right kidney  stone and gas in the biliary tree.  Lipase level was obtained at 528 and  we were called for admission for further evaluation and management.  The  patient, in the ED, had two episodes of emesis in the ED.   PHYSICAL EXAMINATION:  VITAL SIGNS:  Temperature 97.9, blood pressure  148/105 going to 140/84, pulse of 97, respiratory rate 20, satting 98%  on room air.  GENERAL: The patient was lying in bed in no apparent  distress.  HEENT: Normocephalic, atraumatic.  Pupils equal, round, reactive to  light.  Extraocular movements intact.  Oropharynx was clear, moist, no  lesions.  NECK:  Supple.  No lymphadenopathy.  RESPIRATORY:  Lungs were clear to auscultation bilaterally.  CARDIOVASCULAR: Regular rate and rhythm.  No murmurs, rubs or gallops.  ABDOMEN:  Soft, nontender, nondistended, positive bowel sounds.  EXTREMITIES:  No clubbing, cyanosis or edema.  NEUROLOGIC: The patient is alert and oriented x3.  Cranial nerves II-XII  grossly intact.  No focal deficits.  Cerebellum is intact.  Sensation  was intact.  Gait was not tested.   ADMISSION LABORATORIES:  Lipase 528, sodium 137, potassium 3.8, chloride  105, bicarbonate 23, BUN 6, creatinine 0.7, glucose 111, calcium of 9.4,  white count 11.9, hemoglobin 16.7, platelets  232, hematocrit 47.5, ANC  6.6, urobilinogen 0.2, nitrite negative, leukocyte negative.  Micro RBCs  21-50, bacteria was aware.  UA was yellow clear, specific gravity 1.028,  pH 5.5, glucose negative, bilirubin negative, ketones negative, blood  large, protein negative.    HOSPITAL COURSE:  1. Nephrolithiasis.  The patient was admitted and was placed on      intravenous fluids.  Urine studies were obtained and were pending      at time of discharge.  The patient was also placed on supportive      care and also pain management.  The patient was put on analgesic      medication as well.  The patient's symptoms improved on the day of      discharge.  The patient stated that his pain was very minimal.  Had      no urinary symptoms.  No nausea and no vomited and his pain was a      1/10.  The patient was tolerating clear liquids without any      complications.  The patient was well hydrated and will be      discharged in stable and improved condition.  2. Chemical pancreatitis.  The patient was noted to have an elevated      lipase of 528 and amylase level was also checked which was elevated      as well.  The patient did not have any abdominal pain on admission.      The patient was made n.p.o. and was placed on intravenous fluids      for fluid hydration, also was placed on supportive care and pain      management as well.  The patient's was totally asymptomatic      throughout the hospitalization.  CT scan, which was obtained,      showed some gas in the biliary tree.  There was concern for      possible infection.  The patient was placed on Unasyn and      Gastroenterology consult was obtained.  The patient was seen.  It      was felt that this gas was not unusual for one who was status post      pancreatic stent.  It was thought there was no need for any      antibiotic.  The patient's Unasyn was discontinued.  The patient      remained afebrile throughout the hospitalization.  A repeat lipase      was obtained which had decreased from admission.  The patient had a      lipase of 115 down from 528 and an amylase to 186 down from 253.      The patient was in stable and improved condition on that day of      discharge.  The patient will  follow-up at [facility]  to get repeat      lipase and amylase done on May 30, 2007 and also to follow up      with Dr. Randa Evens in the next 1-2 weeks.  The patient was discharged      in stable and improved condition.  3. Leukocytosis.  It was thought secondary to a combination of problem      #1 and #2.  Blood cultures were obtained.  Urine was also obtained      which was negative.  Chest x-ray was negative.  The patient was      initially placed on Unasyn The patient remained afebrile throughout      the hospitalization.  The patient antibiotics will be discontinued.      The patient will be discharged in stable and improved condition.      On the day of discharge, the patient was in stable and improved      condition, was totally asymptomatic, had vitals of temperature of      99.1, pulse of 80, blood pressure 112/76, respiratory rate 18,      satting 96% on room air.  Discharge laboratories.  Lipase 115,      amylase 186, white count 11.9, hemoglobin 15.2, platelets 193,      hematocrit 42.8, sodium 139,      potassium 3.8, chloride 109, bicarbonate 25, BUN 4, creatinine      0.77, glucose 95, calcium 9.0, albumin 3.5, LDH 142, bilirubin 1.1,      alkaline phosphatase 54, AST 21, ALT 28 and protein of 6.1.  It has      been a pleasure taking care of [name].      Leonard Harvest, MD  Electronically Signed     DT/MEDQ  D:  05/27/2007  T:  05/28/2007  Job:  [id number]   cc:   Joycelyn Rua, M.D.  Fax: [phone]   Bernette Redbird, M.D.  Fax: [phone]

## 2010-10-10 NOTE — Consult Note (Signed)
NAME:  [name], [name] NO.:  [id number]   MEDICAL RECORD NO.:  [id number]          PATIENT TYPE:  INP   LOCATION:  1503                         FACILITY:  [facility]   PHYSICIAN:  Bernette Redbird, M.D.   DATE OF BIRTH:  1975/08/01   DATE OF CONSULTATION:  05/26/2007  DATE OF DISCHARGE:                                 CONSULTATION   REQUESTING PHYSICIAN:  Ramiro Harvest, M.D., [facility].   REASON FOR CONSULTATION:  Right flank pain and elevated lipase.   HISTORY OF PRESENT ILLNESS:  This is a 35 year old male who states that  he has pain on an almost daily basis and it varies in intensity.  He has  had multiple visits to the ER and has been diagnosed with  functional  abdominal pain syndrome.  Yesterday, he stated that he had a headache  that was severe for which he took Vicodin at about 6 p.m.  He also  developed right flank pain.  He had __________.  The amount of his urine  decreased to a trickle.  He was concerned so he decided to come to the  ER.  His vomiting started after admission to the ER.  Initially it was  food and evolved to more of a yellow bile liquid that contained some  brown flecks and streaks of blood.  The patient tells me that he takes  Advil on a daily basis usually 400 mg once or twice a day.  He does not  take a PPI.  His last upper endoscopy was with Dr. Fredric Mare at [facility] in 2007.  He denies heartburn, indigestion, anorexia, or  changes in his bowel movements.  He says his bowel movements are brown  and very regular.   PAST MEDICAL HISTORY:  1. Nephrolithiasis.  2. Pancreatitis.  3. Back pain cared for by Dr. Channing Mutters.  4. Low potassium paralysis.  5. Functional abdominal pain syndrome.  6. Appendectomy.  7. Cholecystectomy.  8. Knee surgery.  9. ERCP with pancreatic stent placement and subsequent removal.   ALLERGIES:  1. CORTISONE.  2. KETOROLAC.   CURRENT MEDICATIONS:  Multivitamin, Vicodin,  Advil.   SOCIAL HISTORY:  He tells me that he occasionally drinks alcohol and  that he may go for 2-3 days drinking every day or he may go for 2 months  with nothing.  He also smokes approximately a 1/2 pack of cigarettes per  day and has done so for 14 years.  He denies any recreational drug use.  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   He has no ulcer disease in his family history.   PHYSICAL EXAMINATION:  GENERAL:  He is alert and oriented in no apparent  distress.  VITAL SIGNS:  Temperature is 98.2, pulse is 79, respirations are 18,  blood pressure is 122/76.  CARDIOVASCULAR:  Regular rate and rhythm with no murmurs, rubs, or  gallops appreciated.  LUNGS:  Clear to auscultation.  ABDOMEN:  Soft, nontender, nondistended.  Good bowel sounds.  However,  he does have CVA tenderness,  mild to moderate on the left and moderate  to severe on the right.  He has no lower extremity edema.   LABORATORY:  Shows a cholesterol level of 179.  Amylase 253, lipase 528.  His coags are normal.  His LFTs are normal.  He has 21-50 red blood  cells in his urine.  CBC shows a white count of 11.9, hemoglobin 16.7,  hematocrit 47.5, platelets 232.  B-MET shows a sodium 137, potassium 8,  BUN 6, creatinine 0.74, glucose 111.   DICTATION ENDS HERE.      Stephani Police, PA    ______________________________  Bernette Redbird, M.D.    MLY/MEDQ  D:  05/26/2007  T:  05/26/2007  Job:  [id number]

## 2010-10-10 NOTE — Consult Note (Signed)
NAME:  [name], [name]                 ACCOUNT NO.:  [id number]   MEDICAL RECORD NO.:  [id number]          PATIENT TYPE:  INP   LOCATION:  1503                         FACILITY:  [facility]   PHYSICIAN:  [name], M.D.   DATE OF BIRTH:  04-10-1976   DATE OF CONSULTATION:  05/26/2007  DATE OF DISCHARGE:                                 CONSULTATION   We were asked to see [name] today in consultation for right flank  pain with an elevated lipase by Dr. Janee Ballard of the [facility]  service.   HISTORY OF PRESENT ILLNESS:  This is a 35 year old male who tells me  that he has pain nearly every day, and it varies in its intensity. He  has had multiple visits to the [facility] ER. Yesterday he tells me that  he had a bad headache which preceded the development of right flank pain  at approximately 6:00 p.m. last evening. He urinated and found his urine  to be rust colored. The amount decreased to a slow trickle. This  concerned him, and he decided to come to the emergency room. He was  having severe pain despite taking Vicodin. After his admission to the  emergency room, he started to vomit.  The vomit was initially food and  evolved to a yellow bile liquid that included small brown flecks and  streaks of blood.  The patient tells me that he takes Advil daily,  usually 400 mg b.i.d.  He does not take a proton pump inhibitor.  His  last upper endoscopy was with Dr. Fredric Ballard at [facility] in 2007.  He denies any heartburn, indigestion, anorexia or changes in his bowel  habits.  He says that he moves his bowels on a once-daily basis and that  they are brown, easy to pass.   PAST MEDICAL HISTORY:  Is significant for:  1. Nephrolithiasis.  2. Pancreatitis.  3. Low back pain.  4. History of potassium paralysis.  5. He has also been diagnosed with functional abdominal pain syndrome.   PAST SURGICAL HISTORY:  1. Appendectomy.  2. Cholecystectomy.  3. Knee surgery.  4. He has  had a an ERCP with sphincterotomy.  5. Pancreatic stent placement and subsequent stent removal.   ALLERGIES:  he has allergies to CORTISONE and KETOROLAC.   CURRENT MEDICATIONS:  Advil, a multivitamin, and Vicodin.   SOCIAL HISTORY:  Positive for occasional alcohol, positive for one-half  pack of cigarettes per day x14 years.  He denies any recreational drug  use. 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   FAMILY HISTORY:  Is negative for ulcer disease.   PHYSICAL EXAMINATION:  GENERAL:  He is alert and oriented, in no  apparent distress.  VITAL SIGNS:  His temperature is 98.2, pulse 79, respirations 18, blood  pressure 122/76.  HEART:  Regular rate and rhythm.  LUNGS:  Clear to auscultation bilaterally.  ABDOMEN:  Soft, nontender, nondistended with good bowel sounds.  He does  have CVA tenderness bilaterally with the right side much greater than  the left side.  EXTREMITIES:  The lower extremities show no edema.   LABORATORY DATA:  White count 11.9, hemoglobin 16.7, hematocrit 47.5,  platelets 232,000.  He has 21-50 red blood cells in his urine. Lipase is  528.  Amylase is 253. Cholesterol normal.  Coags are normal. LFTs are  normal.  BMET shows a BUN of 6 with a creatinine of 0.74.   Radiological exam:  His CT scan was reviewed with Dr. Amil Ballard of [facility] radiology. It showed moderate gas in the biliary tree, but this is  not particularly unusual after sphincterotomy with stent placement and  removal. Unfortunately, due to a problem with the radiological system,  we do not have a prior CT scan to compare this one to.  The patient does  have nephrolithiasis, but it is not obstructive.  There is no ureter  stone.  There is also no obvious sign on CT scan of pancreatitis.   ASSESSMENT:  Dr. Molly Maduro Ballard has seen and examined the patient,  collected a history.  His impression is that this is a 35 year old male  with severe right  flank pain and increase in his lipase. He does not  have clinical symptoms of pancreatitis.  The picture appears more like  nephrolithiasis. Increased lipase could be due to multiple things  including pancreatitis, duodenal ulcer, possible pancreatic tumor. Other  causes as an elevated lipase include celiac disease, bowel obstruction,  and pharmaceuticals. These three etiologies are unlikely to be the cause  of an elevated lipase in her case.   PLAN:  Continue supportive care, proton pump inhibitors, pain  medications, rehydration. Will give clear liquids and plan to check  again in the morning.   Thanks very much for this consultation.      Leonard Police, PA    ______________________________  [name], M.D.    MLY/MEDQ  D:  05/26/2007  T:  05/26/2007  Job:  [id number]   cc:   Dr.  [facility]   [name], [facility]

## 2010-10-13 ENCOUNTER — Emergency Department (HOSPITAL_COMMUNITY)
Admission: EM | Admit: 2010-10-13 | Discharge: 2010-10-13 | Disposition: A | Discharge status: Home / Self Care | Payer: BC Managed Care – PPO | Attending: Emergency Medicine | Admitting: Emergency Medicine

## 2010-10-13 DIAGNOSIS — Z9089 Acquired absence of other organs: Secondary | ICD-10-CM | POA: Insufficient documentation

## 2010-10-13 DIAGNOSIS — R10816 Epigastric abdominal tenderness: Secondary | ICD-10-CM | POA: Insufficient documentation

## 2010-10-13 DIAGNOSIS — Z87442 Personal history of urinary calculi: Secondary | ICD-10-CM | POA: Insufficient documentation

## 2010-10-13 DIAGNOSIS — K861 Other chronic pancreatitis: Secondary | ICD-10-CM | POA: Insufficient documentation

## 2010-10-13 DIAGNOSIS — R109 Unspecified abdominal pain: Secondary | ICD-10-CM | POA: Insufficient documentation

## 2010-10-13 LAB — URINALYSIS, ROUTINE W REFLEX MICROSCOPIC
Glucose, UA: NEGATIVE mg/dL
Ketones, ur: NEGATIVE mg/dL
Nitrite: NEGATIVE
Protein, ur: NEGATIVE mg/dL
Urobilinogen, UA: 0.2 mg/dL (ref 0.0–1.0)

## 2010-10-13 LAB — CBC
Hemoglobin: 15.5 g/dL (ref 13.0–17.0)
MCH: 30.8 pg (ref 26.0–34.0)
MCHC: 35.6 g/dL (ref 30.0–36.0)
Platelets: 226 10*3/uL (ref 150–400)
RDW: 12.4 % (ref 11.5–15.5)

## 2010-10-13 LAB — BASIC METABOLIC PANEL
BUN: 8 mg/dL (ref 6–23)
Calcium: 10.2 mg/dL (ref 8.4–10.5)
Creatinine, Ser: 0.85 mg/dL (ref 0.4–1.5)
GFR calc Af Amer: >60 mL/min (ref >60)
GFR calc non Af Amer: >60 mL/min (ref >60)

## 2010-10-13 LAB — DIFFERENTIAL
Basophils Absolute: 0 10*3/uL (ref 0.0–0.1)
Basophils Relative: 0 % (ref 0–1)
Eosinophils Absolute: 0.1 10*3/uL (ref 0.0–0.7)
Monocytes Absolute: 1 10*3/uL (ref 0.1–1.0)
Neutro Abs: 5.9 10*3/uL (ref 1.7–7.7)

## 2010-10-13 LAB — LIPASE, BLOOD: Lipase: 37 U/L (ref 11–59)

## 2010-10-13 NOTE — Consult Note (Signed)
[place]. [facility]  Patient:    SHAHAB, [name] Visit Number: [id number] MRN: [id number]          Service Type: MED Location: 3000 [id number] [id number] Attending Physician:  Emeterio Reeve Dictated by:   Gustavus Messing Orlin Hilding, M.D. Proc. Date: 11/19/01 Admit Date:  11/19/2001   CC:         Payton Doughty, M.D.   Consultation Report  CHIEF COMPLAINT:  Generalized weakness.  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  He has had some nausea over the last day or two.  He took a nap today.  When he awoke this afternoon he had diffuse generalized weakness.  Since Saturday he has complained of some right arm feeling weak and heavy, but no prior history of episodic weakness. He has had some back pain since the onset of the weakness.  REVIEW OF SYSTEMS:  Positive only for some chronic diarrhea.  No chest pain. He has had some nausea, some back pain.  No numbness.  PAST MEDICAL HISTORY: 1. Sinus infection. 2. Ear infection. 3. Remote cholecystectomy with appendectomy a few years ago, with chronic    diarrhea since that time.  MEDICATIONS:  None routinely.  ALLERGIES:  None.  SOCIAL HISTORY:  Recently stopped smoking.  Occasion alcohol use.  No cocaine.  FAMILY HISTORY:  Negative for any history of periodic paralyses or weakness in male or male family members.  PHYSICAL EXAMINATION:  VITAL SIGNS:  Temperature 100.1, pulse 115, blood pressure 134/85, respirations 16, 97% saturation on room air.  HEENT:  Head is normocephalic, atraumatic.  NECK:  Supple.  Without bruits.  HEART:  Regular rate and rhythm.  LUNGS:  Clear to auscultation.  EXTREMITIES:  Without edema.  GENERAL:  He is in moderate distress secondary to the back  pain.  NEUROLOGIC:  Mental status:  He is awake and alert.  He is fully oriented, with normal language and cognition.  Cranial nerves:  Pupils are equal and reactive.  Extraocular movements are intact.  Facial sensations are normal. Visual field are full.  Facial motor acuity is normal.  Hearing is intact. Palate is symmetric.  Tongue is midline.  No motor weakness in the face.  On motor exam he has about 2/5 right upper and lower extremity weakness and 3/5 left upper and lower extremity weakness with decreased tone.  Reflexes are tract at triceps.  Otherwise negative.  Downgoing toes bilaterally.  Sensory exam is normal.  Coordination:  He cannot cooperate.  LABORATORY DATA:  MRI scan of the cervical spine is negative.  In his laboratories, the most marked change is a potassium of 1.9 in the face of otherwise normal laboratories.  Sodium is 141, chloride 110, CO2 22, BUN 7, creatinine 1.0, glucose elevated at 205, calcium 10.5, alkaline phosphatase is 86, AST 23, ALT 26, protein 7.8, albumin 4.5, total bilirubin 0.9.  White blood cell count 12.2, hemoglobin 18.2, hematocrit 52.9, platelets 270.  IMPRESSION:  Hypokalemia, which is profound, with generalized weakness.  This certainly could be hypokalemic periodic paralysis although the patient has had no prior episodes, and he has had chronic diarrhea, which certainly could lead to hypokalemia.  However, he does not have other expected changes of chronic diarrhea such as a low CO2  or ______.  His age is right for first attack. There is no family history, however.  We need to check PFTs and aldosterone level.  RECOMMENDATIONS:  Agree with repletion of potassium.  Would also give him 2 g of magnesium empirically.  Check the magnesium.  Check PFTs and aldosterone level.  He may respond long-term to high potassium, low carbohydrate, low sodium diet.  He may require spironolactone or Diamox.  Will follow. Dictated by:   Gustavus Messing  Orlin Hilding, M.D. Attending Physician:  Emeterio Reeve DD:  11/19/01 TD:  11/21/01 Job: [id number]

## 2010-10-13 NOTE — Discharge Summary (Signed)
NAME:  Leonard, Ballard                 ACCOUNT NO.:  [id number]   MEDICAL RECORD NO.:  [id number]          PATIENT TYPE:  INP   LOCATION:  4728                         FACILITY:  [facility]   PHYSICIAN:  Marlan Palau, M.D.  DATE OF BIRTH:  02-27-1976   DATE OF ADMISSION:  08/17/2004  DATE OF DISCHARGE:  08/18/2004                                 DISCHARGE SUMMARY   ADMISSION DIAGNOSIS:  Hypokalemic periodic paralysis with recent  exacerbation.   DISCHARGE DIAGNOSES:  1.  Hypokalemic periodic paralysis with recent exacerbation.  2.  History of renal calculi.   PROCEDURES DURING THIS ADMISSION:  None.   COMPLICATIONS:  None.   HISTORY OF PRESENT ILLNESS:  [name] is a 35 year old white male, born  Feb 17, 1976, with a history of hypokalemic periodic paralysis followed  through [facility] by Dr. Orlin Hilding.  The patient has done  well for the last two years without any problems but, on the morning of  admission, had the relatively rapid onset of generalized weakness in the  morning.  The patient had generalized all four extremity weakness, both  proximally and distally, noted fullness sensation and muscle tingling and  numbness in his body all over.  Denied any headache, loss of vision, slurred  speech, difficulty swallowing, difficulty breathing.  The patient came to  the emergency room for an evaluation.  On admission, the potassium level was  3.1.  The patient was felt to be in exacerbation of periodic paralysis and  was admitted for further evaluation.   PAST MEDICAL HISTORY:  1.  History of hypokalemic periodic paralysis.  2.  Renal calculi.   MEDICATIONS PRIOR TO ADMISSION:  None.   ALLERGIES:  Intolerance to CORTISONE which seems to exacerbate his periodic  paralysis.   PAST SURGICAL HISTORY:  The patient has, in the past, had gallbladder  surgery and appendectomy.   The patient drinks alcohol on occasion.  Smokes a pack a day.  Please refer  to H&P  dictation summary for social history, family history, review of  systems, physical examination.   LABORATORY VALUES:  Laboratory values are notable for a potassium level on  the day of discharge of 3.9, sodium 138, __________ 111, CO2 24, glucose of  89, BUN of 8, creatinine of 0.8, calcium 8.6.  Again, admission potassium  was 3.1.  White count was 14.1 on admission, hemoglobin of 16.7, hematocrit  of 47.2, MCV 86.8, platelets of 256,000.  Repeat white count was 9.7,  hemoglobin 14.9.   HOSPITAL COURSE:  The patient was admitted after getting four runs of  potassium 10 mEq.  The patient was then given IV fluids with potassium  supplementation as well.  The patient seemed to improve overnight.  In the  morning, he was complaining of some paresthesias and pain but this markedly  improved.  His strength was improved.  The patient was still having some  cramps.  The patient was given oral potassium 40 mEq.  Repeat potassium in  the blood was normal.  At the time of discharge, on the evening of August 18, 2004, the patient is still having some crampiness and slight pain but is  much better.  He is ambulatory at this point and is doing well.  The patient  will be discharged to home.  He will be taking potassium supplementation, a  total of 60 mEq, for the next two days then drop back to 20 mEq a day.  The  patient will follow up with [facility] within the next 4-  6 weeks with Dr. Orlin Hilding and will contact us if he has any further problems.  The patient is to try to regulate his diet to have a low-salt, low-  carbohydrate diet.      CKW/MEDQ  D:  08/18/2004  T:  08/20/2004  Job:  [id number]   cc:   [facility]  [address]  [place], [place] [address]

## 2010-10-13 NOTE — Discharge Summary (Signed)
NAME:  [name], [name]                 ACCOUNT NO.:  [id number]   MEDICAL RECORD NO.:  [id number]          PATIENT TYPE:  IPS   LOCATION:  0305                          FACILITY:  [facility]   PHYSICIAN:  Anselm Jungling, MD  DATE OF BIRTH:  [birth year]   DATE OF ADMISSION:  08/27/2005  DATE OF DISCHARGE:  08/28/2005                                 DISCHARGE SUMMARY   IDENTIFYING INFORMATION:  This is a 35 year old white male who is divorced.  This is a voluntarily admission.   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  He had been seen in the emergency department for his  chronic right-sided pain on Sunday evening, at which time he had had nausea  and vomiting for more than an hour to the point that it was getting out of  control and then says that the ER doctor had implied that he was med-  seeking.  The patient does have a history of taking opiates but none  currently.  Had taken himself off Lortab that he had previously been taking  for the right-sided pain.  He denies any suicidal ideation today.  Does  endorse that he has had some depressed mood with anxiety and panic attacks,  up to about two times weekly, usually triggered by the stress of this  chronic right side pain, which is gastroenterologist had told him was due to  possible pancreatitis which chemistries had not clearly reflected.  The  patient denies any homicidal thought, denies any suicidal thought at this  time and is contracting for safety.   PAST PSYCHIATRIC HISTORY:  The patient has a history of no prior admissions.  He has been treated in the past for spells of depression, which have  occurred on and off and, in the past, has taken Wellbutrin which caused him  to be quite nervous and jittery, Lexapro for which he got the most benefit.  Initially, it started off very well, then tapered  off, became ineffective  and he believes it may have contributed to some irritable mood.  Has never  taken Cymbalta or Zoloft.  Paxil also made him quite jittery.  Does not  remember any other medication trials.  Denies any prior suicide attempts or  thoughts.  No history of hallucinations.   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  He has been divorced about two years  from his previous wife and is currently engaged and expecting a new baby  with his fiance.  Expects to get married sometime during the year.  Currently living with his parents who are supportive.   FAMILY HISTORY:  Remarkable for a brother with ADHD who takes Strattera and  a mother with Graves' disease.   ALCOHOL/DRUG HISTORY:  The patient denies any substance abuse, now or in the  past.   MEDICAL HISTORY:  The patient is followed by Dr. Regino Schultze in [place],  [facility] [place] and by Dr. Randa Evens, who is his gastroenterologist.  Medical  problems are a questionable history of pancreatitis, right-sided pain not  otherwise specified with negative GI workup.  He does have a history of  kidney stones and, in the past, has taken Mepergan for that.  No recent  indication of stones.  Also has a history of cholecystectomy.  No history of  seizures, blackouts or memory loss.   MEDICATIONS:  None.   ALLERGIES:  Cortisone and Toradol, which both in the past have caused hives.   POSITIVE PHYSICAL FINDINGS:  GENERAL:  Well-nourished, well-developed male  who is in no acute distress.  VITAL SIGNS:  Temperature 97.7, pulse 87, respirations 18, blood pressure  131/90, height 6 feet 1 inch tall, weight 202 pounds.  HEAD:  Normocephalic.  EENT:  PERRL.  Sclera nonicteric.  Oropharynx noninjected.  NECK:  Supple.  No thyromegaly or lymphadenopathy.  CHEST:  Clear to auscultation.  BREAST:  Exam deferred.  CARDIOVASCULAR:  S1 and S2 are heard.  No clicks,  murmurs or gallops.  ABDOMEN:  Flat, soft, nontender, nondistended.  GENITOURINARY:  Deferred.  EXTREMITIES:  Pink and warm.  Pulse is 2+.  No peripheral edema.  SKIN:  Clear, pale in tone.  No remarkable scars or signs of self-  mutilation.  NEUROLOGIC:  Cranial nerves 2-12 are intact.  Extraocular movements within  normal limits.  Gait normal.  Cerebellar function intact.  Romberg without  findings.  No focal findings.   REVIEW OF SYSTEMS:  Remarkable for patient having some onset of right-sided  pain that extends from his belly button to the right around his waist to the  right flank area, which was spasmodic accompanied by some nausea which he  rated a 6/10 with onset early this morning and lasted approximately four  hours.  No pain at this time.  No vomiting.  No diarrhea.  Normal bowel  movement yesterday.  No fever or chills.  Denies any joint aches or  headache.  Weight is currently stable.  No shortness of breath or wheezing.  No difficulty passing his urine.   LABORATORY DATA:  WBC 11.4, hemoglobin 16.3, hematocrit 46.2, platelets  260,000.  Electrolytes with sodium 140, potassium 4.4, chloride 106, carbon  dioxide 27, BUN 9, creatinine 1.1.  Liver enzymes with SGOT 30, SGPT 50,  alkaline phosphatase 60 and total bilirubin 0.8.  His amylase was 58 and  lipase 23, all within normal limits.  UA revealed a trace of blood.  TSH and  urine drug screen are currently pending.   MENTAL STATUS EXAM:  Fully alert male.  Pleasant and cooperative.  He is  polite.  Well-spoken.  Speech is normal in pace, tone and amount.  Mood  anxious.  Thought process clearly without any suicidal ideation,  categorically promises safety in the community.  No homicidal thoughts.  No  evidence of psychosis.  He has a plan for himself for this week.  Would like  to follow up with outpatient.  Cognitively, he is intact and oriented x3.  His concerns are mainly for his fiance for his symptoms.  He has  appropriate questions about medications and recommendations for treatment.   ADMISSION DIAGNOSES:  AXIS I:  Rule out mood disorder not otherwise  specified.  AXIS II:  No diagnosis.  AXIS III:  Chronic right-sided pain not otherwise specified.  AXIS IV:  Moderate (stress with medical problems).  AXIS V:  Current 58; past year 35-75.  HOSPITAL COURSE:  We are going to go ahead and discharge this patient today.  His family support is good.  He is able to contract for safety clearly.  He  plans on following up in the outpatient clinic.  We are going to try him on  a trial of Remeron 15 mg q.h.s. because he has had some difficulty sleeping  and will discharge him today.   FOLLOW UP:  He will follow up with Jorje Guild in the outpatient clinic.      Margaret A. Lorin Picket, N.P.      Anselm Jungling, MD  Electronically Signed    MAS/MEDQ  D:  08/28/2005  T:  08/30/2005  Job:  [id number]

## 2010-10-13 NOTE — H&P (Signed)
NAME:  [name], GROB NO.:  [id number]   MEDICAL RECORD NO.:  [id number]          PATIENT TYPE:  EMS   LOCATION:  MAJO                         FACILITY:  [facility]   PHYSICIAN:  Petra Kuba, M.D.    DATE OF BIRTH:  Jun 03, 1975   DATE OF ADMISSION:  06/21/2005  DATE OF DISCHARGE:  06/21/2005                                HISTORY & PHYSICAL   The patient is seen in the ER at the request of Dr. Vilinda Boehringer, my partner,  for right upper quadrant pain that has been going on for a month or two. He  has had multiple nondiagnostic tests that can come on at any time. Pain  medicines do not seem to effect it. He has had some nausea without vomiting.  Zofran works the best. Specifically, he has had small bowel series, multiple  CT's, MRCP, and multiple lab profiles as well as back x-rays by Dr. Channing Mutters; all  have been nondiagnostic. He presents now to the ER and we are awaiting liver  test to see if we can confirm CBD stones and proceed with ERCP.   PAST MEDICAL HISTORY:  1.  Cholecystectomy.  2.  Appendectomy.  3.  Knee surgery.  4.  Back problems seen by Dr. Channing Mutters.  5.  Low potassium paralysis problem which has not recurred in a few years.   ALLERGIES:  TORADOL and CORTISONE.   FAMILY HISTORY:  Negative for any obvious GI problems.   CURRENT MEDICATIONS:  Pain medicine and nausea medicine.   SOCIAL HISTORY:  Has smoked periodically. Socially drinks. Denies any over-  the-counter medicine use.   REVIEW OF SYSTEMS:  Pertinent for the weight loss of about 25 pounds in the  last month or two but no signs of GI bleeding. No change in his bowel habits  and no urinary compliance.   PHYSICAL EXAMINATION:  No acute distress lying comfortable on the bed.  Sclerae are nonicteric. Vital signs are stable. Lungs clear. Regular rate  and rhythm. Abdomen is soft and nontender. Cannot reduplicate the pain.   His white count is up slightly at 14.6. It was 11 five days ago. Hemoglobin  and platelets normal. No left shift. Liver test and pancreatic test pending  at the time of dictation.   ASSESSMENT:  A one to two-month history of right upper quadrant pain,  negative workup today seen by Dr. Gaynelle Arabian, Dr. Vilinda Boehringer, Dr. Dorena Cookey, and Dr. Trey Sailors with negative multiple CT scans, MRCP, small bowel  follow-through and labs with final x-rays today.   PLAN:  If his liver tests are moderately increased, we will proceed with an  ERCP. The risks and methods were discussed. If his liver tests are okay, I  would proceed with an endoscopic ultrasound. I discussed the issues with  scheduling. I paged Dr. Christella Hartigan. He is unfortunately off tomorrow; however,  he is willing to do this next week. If need be if his liver tests are normal  as well as an amylase and lipase, I think discharging him with restarting  his Dilaudid  and Zofran would seem to help him the best and a trial of  Librax. If the ultrasound is positive, then Dr. Christella Hartigan could proceed with  the ERCP at that junction while he is sedated. If negative, consideration of  university referral versus laparoscopy and lysis of adhesions since that  could possibly be a cause. I have discussed the above with the patient and  his step-dad who hopefully will be in agreement with the plan; although his  mother seems to believe that he needs to stay in the hospital up until this  is resolved, although with the scheduling for the endoscopic ultrasound and  not wanting to take the risks of the ERCP unless there is more evidence to  fine common bile duct stones and based on having a negative exam I think  home management should be acceptable.           ______________________________  Petra Kuba, M.D.     MEM/MEDQ  D:  06/21/2005  T:  06/22/2005  Job:  [id number]   cc:   Fayrene Fearing L. Malon Kindle., M.D.  Fax: [phone]   Lucrezia Starch. Earlene Plater, M.D.  Fax: [phone]

## 2010-10-13 NOTE — Op Note (Signed)
NAME:  [name], [name]                 ACCOUNT NO.:  [id number]   MEDICAL RECORD NO.:  [id number]          PATIENT TYPE:  AMB   LOCATION:  NESC                         [facility]ACILITY:  [facility]   PHYSICIAN:  Ronald L. Earlene Plater, M.D.  DATE OF BIRTH:  01/29/1976   DATE OF PROCEDURE:  08/11/2004  DATE OF DISCHARGE:                                 OPERATIVE REPORT   PREOPERATIVE DIAGNOSES:  Gross hematuria and positive NMP22.   POSTOPERATIVE DIAGNOSES:  Gross hematuria and positive NMP22.   OPERATION:  Cystourethroscopy, bilateral retrograde ureteropyelograms,  bilateral renal pelvic barbotage cytologies and barbotage bladder cytology.   SURGEON:  Lucrezia Starch. Earlene Plater, M.D.   ASSISTANT:  Alessandra Bevels. Chase Picket, FNP-C   ANESTHESIA:  LMA.   ESTIMATED BLOOD LOSS:  Negligible.   TUBES:  None.   COMPLICATIONS:  None.   INDICATIONS FOR PROCEDURE:  [name] is a very nice 35 year old white male  who essentially presented with right flank pain, lower back and side pain  and gross hematuria. He also had fever ongoing for approximately four weeks.  He has had a kidney stone in the past and had a basket extraction of stone  when he was 35 years old. He had a lithotripsy done elsewhere. On workup,  ultrasound of the kidneys was essentially normal. Cystourethroscopy  essentially was normal, however, an NMP22 was positive. After understanding  the risks, benefits, and alternatives, he elected to proceed with the above  procedure.   DESCRIPTION OF PROCEDURE:  The patient was placed in a supine position,  after proper LMA anesthesia was placed in the dorsal lithotomy position,  prepped and draped with Betadine in a sterile fashion. Cystourethroscopy was  performed with a 22.5 [place] Olympus panendoscope. He was noted to have so  significant prostatic enlargement; however, there were some areas that  appeared to be cystitis cystica in the prostatic urethra that could be  consistent with prostatitis. The bladder  was smooth walled although the  bladder neck was somewhat friable and no lesions were noted with the 12 and  70 degree lenses. Both ureteral orifices were in normal location and efflux  of clear urine was noted bilaterally. Bilaterally __________ retrograde  ureteropyelograms were performed with an 8 French cone tip catheter and both  intercollecting systems bilaterally appeared to be essentially normal, no  filling defects and they appeared to drain properly. A 6 French open ended  catheter was placed in the right first and then the left renal pelvis and  utilizing normal saline barbotage renal pelvic cytologies were obtained and  submitted for cytology. These were removed and then barbotage cytologies  were obtained from the bladder, combined with first  urine on cystoscopy to obtain a bladder cytology.  Good hemostasis was noted  to be present, no other lesions were noted, the bladder was drained. The  panendoscope was removed and the patient was taken to the recovery room  stable.      RLD/MEDQ  D:  08/11/2004  T:  08/11/2004  Job:  [id number]

## 2010-10-13 NOTE — H&P (Signed)
NAME:  [name], [name]                 ACCOUNT NO.:  [id number]   MEDICAL RECORD NO.:  [id number]          PATIENT TYPE:  INP   LOCATION:  4728                         FACILITY:  [facility]   PHYSICIAN:  Pramod P. Pearlean Brownie, MD    DATE OF BIRTH:  07/05/1975   DATE OF ADMISSION:  08/17/2004  DATE OF DISCHARGE:                                HISTORY & PHYSICAL   REFERRING PHYSICIAN:  Hilda Lias, M.D.   REASON FOR REFERRAL:  Weakness.   HISTORY OF PRESENT ILLNESS:  [name] is a 35 year old Caucasian male who  developed sudden onset of generalized weakness this morning.  The patient  states that he woke up this feeling fine, but when he took a nap in the  afternoon, he noticed generalized all-4-extremity weakness which was both  proximally and distally.  He also noticed fullness in his muscles as well as  tingling numbness all over his body.  He denies any headache or loss of  vision, slurred speech, difficulty swallowing or weakness of his neck.  These symptoms have persisted.  He had a similar episode in June of 2003  when he had treatment for sinus infection and fluid in the ears and he was  given a cortisone shot and started on Keflex; he developed upper extremity  diffuse generalized weakness.  At that time, he was treated with potassium  and showed improvement.  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  He was put on potassium for about a month and it was  discontinued.  He did well for 2-1/2 years, until today.  He has recently  been diagnosed to have kidney stones and had cystitis for which he was  treated with ciprofloxacin and in fact, just finished a course yesterday.  The patient denies any other obvious provocating factors in the form of  heavy  carbohydrate meals, severe physical exertion or ongoing stress.   PAST MEDICAL HISTORY:  His past medical history is significant only for  renal stones and periodic paralysis.   HOME MEDICATIONS:  None.   MEDICATION ALLERGIES:  CORTISONE.   PAST SURGICAL HISTORY:  Surgery on gallbladder and appendix.   SOCIAL HISTORY:  The patient lives in [place] with his mom, is single.  He smokes 1 pack per day for 10 years.  He drinks occasional alcohol and  denies doing drugs.  He works for a [employer].   REVIEW OF SYSTEMS:  Review of systems significant for recent kidney stones,  viral infection and __________.  No shortness of breath, cough, diarrhea or  fever.   PHYSICAL EXAM:  GENERAL:  Physical exam reveals a healthy-looking Caucasian  male who is not in distress.  He acts anxious.  VITAL SIGNS:  He is afebrile with a temperature of 98.5.  Pulse rate is 89  per minute, regular sinus.  Respiratory rate 22 per  minute.  Blood pressure  166/93.  SATS 99% on room air.  HEAD:  Nontraumatic.  NECK:  Neck is supple without bruits.  ENT:  Exam unremarkable.  CARDIAC EXAM:  No murmur or gallop.  LUNGS:  Clear to auscultation.  ABDOMEN:  Soft and nontender.  NEUROLOGIC EXAM:  The patient is awake, alert x3 with normal speech and  language function.  There is no aphasia, dyspraxia or dysarthria.  Pupils  are equal and reactive to light and accommodation.  There is no ptosis.  His  face is symmetric with good strength.  He has good strength and tone in his  neck muscles.  He has generalized weakness in all 4 extremities, proximally  as well as distally, grade 3/5.  He is unable to hold the legs or hands up  against gravity for more than a few seconds.  There is give-away weakness in  legs as well, __________.  Deep tendon reflexes are brisk and symmetric in  all 4 extremities.  Plantars are downgoing.  He has no objective sensory  loss, but he complaints of subjective  paresthesias in all 4 extremities.  Coordination is slow, but accurate in upper extremities.  His gait was not  tested.   DATA REVIEWED:  Admission labs reveal a white count of 14.1 without any left  shift.  Potassium is low at 3.1.   IMPRESSION:  [age]-year-old Caucasian male with sudden onset of  generalized all-4-extremity weakness following a urinary tract infection,  likely exacerbation of hypokalemic periodic paralysis triggered by the  recent bladder infection.   PLAN:  The patient is being admitted to the neurology service.  We will  replete his potassium IV and start p.o. potassium as well.  We will also  recheck his urine and chest x-rays to rule out any other alternative source  of infection.  I had an extensive discussion with the patient and his mother  regarding his symptoms and answered questions.      PPS/MEDQ  D:  08/17/2004  T:  08/18/2004  Job:  [id number]   cc:   Hilda Lias, M.D.  [address]  [place], [place] [address]  Fax: [phone]

## 2010-10-13 NOTE — H&P (Signed)
[place]  Patient:    [name], [name] Visit Number: [id number] MRN: [id number]          Service Type: MED Location: 3000 [id number] [id number] Attending Physician:  Emeterio Reeve Dictated by:   Payton Doughty, M.D. Admit Date:  11/19/2001 Discharge Date: 11/21/2001                           History and Physical  ADMITTING DIAGNOSIS:  Weakness.  HISTORY OF PRESENT ILLNESS:  This is a 35 year old right-handed right gentleman who woke up this afternoon with diffuse weakness, unable to ambulate or sit unassisted.  He had a cortisone shot this morning and antibiotics for fluid in his ear.  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  He has noticed some frank pressure in his neck posteriorly.  He said he may have had some diarrhea for a couple of days.  PAST MEDICAL HISTORY:  Otherwise benign.  ALLERGIES:  There are no allergies.  PAST SURGICAL HISTORY:  Appendectomy, cholecystectomy, knee operations.  MEDICATIONS:  Phenergan, Keflex.  SOCIAL HISTORY:  He quit smoking two months ago.  FAMILY HISTORY:  Mother has cervical spondylosis.  There is no known family history of similar episodes to this.  REVIEW OF SYSTEMS:  Remarkable for occasional headaches, nausea since yesterday, no vomiting.  PHYSICAL EXAMINATION:  HEENT:  Within normal limits.  NECK:  Supple, normal range of motion, no meningismus.  CHEST:  Clear.  CARDIAC:  Regular rate and rhythm with no murmur.  ABDOMEN: Nontender, positive bowel sounds.  EXTREMITIES:  Without clubbing or cyanosis.  NEUROLOGIC:  He is awake, alert, and oriented x3.  His cranial nerves are intact.  Deltoid is about 4, biceps are 3, triceps 3, grip is 3, hip flexors are 3, knee extensors are 3, dorsiflexion is 2, plantar flexors are 3-4. There is no sensory deficit.  Reflexes are absent save for the ankle jerks, which are flicker.  He has indifferent toes and no  Hoffmans.  LABORATORY DATA:  His MRI of the cervical spine is negative.  On the metabolic profile, his potassium is 1.9.  CLINICAL IMPRESSION:  Hypokalemia causing secondary diffuse weakness.  PLAN:  He is going to be admitted and have his potassium replaced.  Catherine A. Orlin Hilding, M.D., is visiting with him regarding his possible etiologies of his hypokalemia.  He is going to be admitted to an ICU monitoring bed. Dictated by:   Payton Doughty, M.D. Attending Physician:  Emeterio Reeve DD:  11/19/01 TD:  11/21/01 Job: [id number]

## 2010-10-16 ENCOUNTER — Emergency Department (HOSPITAL_COMMUNITY): Payer: BC Managed Care – PPO

## 2010-10-16 ENCOUNTER — Emergency Department (HOSPITAL_COMMUNITY)
Admission: EM | Admit: 2010-10-16 | Discharge: 2010-10-16 | Disposition: A | Discharge status: Home / Self Care | Payer: BC Managed Care – PPO | Attending: Emergency Medicine | Admitting: Emergency Medicine

## 2010-10-16 DIAGNOSIS — R1011 Right upper quadrant pain: Secondary | ICD-10-CM | POA: Insufficient documentation

## 2010-10-16 DIAGNOSIS — N390 Urinary tract infection, site not specified: Secondary | ICD-10-CM | POA: Insufficient documentation

## 2010-10-16 DIAGNOSIS — R000 Tachycardia, unspecified: Secondary | ICD-10-CM | POA: Insufficient documentation

## 2010-10-16 DIAGNOSIS — Z87442 Personal history of urinary calculi: Secondary | ICD-10-CM | POA: Insufficient documentation

## 2010-10-16 DIAGNOSIS — R319 Hematuria, unspecified: Secondary | ICD-10-CM | POA: Insufficient documentation

## 2010-10-16 DIAGNOSIS — Z9089 Acquired absence of other organs: Secondary | ICD-10-CM | POA: Insufficient documentation

## 2010-10-16 LAB — CBC
MCV: 85.5 fL (ref 78.0–100.0)
Platelets: 231 10*3/uL (ref 150–400)
RBC: 4.76 MIL/uL (ref 4.22–5.81)
RDW: 12.4 % (ref 11.5–15.5)
WBC: 10.4 10*3/uL (ref 4.0–10.5)

## 2010-10-16 LAB — URINALYSIS, ROUTINE W REFLEX MICROSCOPIC
Bilirubin Urine: NEGATIVE
Ketones, ur: NEGATIVE mg/dL
Nitrite: NEGATIVE
Specific Gravity, Urine: 1.018 (ref 1.005–1.030)
Urobilinogen, UA: 0.2 mg/dL (ref 0.0–1.0)
pH: 5.5 (ref 5.0–8.0)

## 2010-10-16 LAB — DIFFERENTIAL
Basophils Relative: 0 % (ref 0–1)
Eosinophils Absolute: 0.1 10*3/uL (ref 0.0–0.7)
Eosinophils Relative: 1 % (ref 0–5)
Lymphs Abs: 1.8 10*3/uL (ref 0.7–4.0)
Neutrophils Relative %: 74 % (ref 43–77)

## 2010-10-16 LAB — COMPREHENSIVE METABOLIC PANEL
ALT: 32 U/L (ref 0–53)
Alkaline Phosphatase: 55 U/L (ref 39–117)
BUN: 9 mg/dL (ref 6–23)
CO2: 23 mEq/L (ref 19–32)
GFR calc non Af Amer: >60 mL/min (ref >60)
Glucose, Bld: 109 mg/dL — ABNORMAL HIGH (ref 70–99)
Potassium: 3.6 mEq/L (ref 3.5–5.1)
Sodium: 138 mEq/L (ref 135–145)
Total Bilirubin: 0.3 mg/dL (ref 0.3–1.2)

## 2010-10-16 LAB — URINE MICROSCOPIC-ADD ON

## 2010-10-16 LAB — LIPASE, BLOOD: Lipase: 35 U/L (ref 11–59)

## 2010-10-17 LAB — GC/CHLAMYDIA PROBE AMP, URINE: GC Probe Amp, Urine: NEGATIVE

## 2010-10-20 ENCOUNTER — Emergency Department (HOSPITAL_COMMUNITY)
Admission: EM | Admit: 2010-10-20 | Discharge: 2010-10-20 | Disposition: A | Discharge status: Home / Self Care | Payer: BC Managed Care – PPO | Attending: Emergency Medicine | Admitting: Emergency Medicine

## 2010-10-20 DIAGNOSIS — Z87442 Personal history of urinary calculi: Secondary | ICD-10-CM | POA: Insufficient documentation

## 2010-10-20 DIAGNOSIS — M549 Dorsalgia, unspecified: Secondary | ICD-10-CM | POA: Insufficient documentation

## 2010-10-20 DIAGNOSIS — R109 Unspecified abdominal pain: Secondary | ICD-10-CM | POA: Insufficient documentation

## 2010-10-20 LAB — URINE MICROSCOPIC-ADD ON

## 2010-10-20 LAB — URINALYSIS, ROUTINE W REFLEX MICROSCOPIC
Bilirubin Urine: NEGATIVE
Glucose, UA: NEGATIVE mg/dL
Hgb urine dipstick: NEGATIVE
Protein, ur: 30 mg/dL — AB
Urobilinogen, UA: 0.2 mg/dL (ref 0.0–1.0)

## 2010-10-21 LAB — URINE CULTURE: Culture  Setup Time: 201205251822

## 2011-01-21 ENCOUNTER — Emergency Department (HOSPITAL_COMMUNITY): Payer: BC Managed Care – PPO

## 2011-01-21 ENCOUNTER — Emergency Department (HOSPITAL_COMMUNITY)
Admission: EM | Admit: 2011-01-21 | Discharge: 2011-01-21 | Disposition: A | Discharge status: Home / Self Care | Payer: BC Managed Care – PPO | Attending: Emergency Medicine | Admitting: Emergency Medicine

## 2011-01-21 DIAGNOSIS — Z9889 Other specified postprocedural states: Secondary | ICD-10-CM | POA: Insufficient documentation

## 2011-01-21 DIAGNOSIS — R1011 Right upper quadrant pain: Secondary | ICD-10-CM | POA: Insufficient documentation

## 2011-01-21 DIAGNOSIS — R112 Nausea with vomiting, unspecified: Secondary | ICD-10-CM | POA: Insufficient documentation

## 2011-01-21 DIAGNOSIS — Z9089 Acquired absence of other organs: Secondary | ICD-10-CM | POA: Insufficient documentation

## 2011-01-21 LAB — DIFFERENTIAL
Eosinophils Absolute: 0.1 10*3/uL (ref 0.0–0.7)
Lymphs Abs: 2.6 10*3/uL (ref 0.7–4.0)
Monocytes Absolute: 1.1 10*3/uL — ABNORMAL HIGH (ref 0.1–1.0)
Monocytes Relative: 12 % (ref 3–12)
Neutrophils Relative %: 57 % (ref 43–77)

## 2011-01-21 LAB — URINALYSIS, ROUTINE W REFLEX MICROSCOPIC
Nitrite: NEGATIVE
Protein, ur: NEGATIVE mg/dL
Urobilinogen, UA: 0.2 mg/dL (ref 0.0–1.0)

## 2011-01-21 LAB — CBC
MCH: 30.8 pg (ref 26.0–34.0)
MCHC: 35.9 g/dL (ref 30.0–36.0)
MCV: 85.8 fL (ref 78.0–100.0)
Platelets: 208 10*3/uL (ref 150–400)
RBC: 5.29 MIL/uL (ref 4.22–5.81)

## 2011-01-21 LAB — COMPREHENSIVE METABOLIC PANEL
AST: 21 U/L (ref 0–37)
CO2: 27 mEq/L (ref 19–32)
Calcium: 10.4 mg/dL (ref 8.4–10.5)
Creatinine, Ser: 0.75 mg/dL (ref 0.50–1.35)
GFR calc Af Amer: >60 mL/min (ref >60)
GFR calc non Af Amer: >60 mL/min (ref >60)
Glucose, Bld: 103 mg/dL — ABNORMAL HIGH (ref 70–99)

## 2011-01-21 LAB — URINE MICROSCOPIC-ADD ON

## 2011-02-20 LAB — HEPATIC FUNCTION PANEL
AST: 31
Albumin: 4.2
Total Bilirubin: 0.9

## 2011-02-20 LAB — DIFFERENTIAL
Basophils Absolute: 0.1
Eosinophils Relative: 1
Eosinophils Relative: 1
Lymphocytes Relative: 21
Lymphocytes Relative: 23
Lymphs Abs: 3.8
Monocytes Relative: 8
Neutro Abs: 8.6 — ABNORMAL HIGH

## 2011-02-20 LAB — CBC
HCT: 46.7
HCT: 47.3
Hemoglobin: 16.5
MCV: 91.4
Platelets: 259
RBC: 5.14
RBC: 5.18
WBC: 12.9 — ABNORMAL HIGH
WBC: 16.2 — ABNORMAL HIGH

## 2011-02-20 LAB — COMPREHENSIVE METABOLIC PANEL
BUN: 9
CO2: 24
Chloride: 106
Creatinine, Ser: 0.89
GFR calc non Af Amer: >60
Glucose, Bld: 104 — ABNORMAL HIGH
Total Bilirubin: 0.3

## 2011-02-20 LAB — BASIC METABOLIC PANEL
BUN: 7
GFR calc Af Amer: >60
GFR calc non Af Amer: >60
Potassium: 3.1 — ABNORMAL LOW
Sodium: 135

## 2011-02-20 LAB — LIPASE, BLOOD: Lipase: 92 — ABNORMAL HIGH

## 2011-02-20 LAB — URINALYSIS, ROUTINE W REFLEX MICROSCOPIC
Glucose, UA: NEGATIVE
Ketones, ur: NEGATIVE
pH: 6

## 2011-02-20 LAB — AMYLASE: Amylase: 93

## 2011-02-22 LAB — COMPREHENSIVE METABOLIC PANEL
ALT: 26
BUN: 9
Calcium: 9.5
Creatinine, Ser: 0.87
GFR calc non Af Amer: >60
Glucose, Bld: 101 — ABNORMAL HIGH
Sodium: 138
Total Protein: 6.7

## 2011-02-22 LAB — URINALYSIS, ROUTINE W REFLEX MICROSCOPIC
Bilirubin Urine: NEGATIVE
Nitrite: NEGATIVE
Protein, ur: NEGATIVE
Specific Gravity, Urine: 1.012
Urobilinogen, UA: 0.2

## 2011-02-22 LAB — DIFFERENTIAL
Lymphocytes Relative: 39
Lymphs Abs: 4.1 — ABNORMAL HIGH
Monocytes Relative: 11
Neutro Abs: 4.8
Neutrophils Relative %: 46

## 2011-02-22 LAB — CBC
Hemoglobin: 15.7
MCHC: 34.9
MCV: 90.4
RDW: 13

## 2011-02-22 LAB — URINE MICROSCOPIC-ADD ON

## 2011-02-22 LAB — LIPASE, BLOOD: Lipase: 43

## 2011-02-26 LAB — DIFFERENTIAL
Eosinophils Absolute: 0.2
Eosinophils Relative: 2
Lymphocytes Relative: 18
Lymphs Abs: 2.9
Monocytes Absolute: 1.5 — ABNORMAL HIGH

## 2011-02-26 LAB — URINALYSIS, ROUTINE W REFLEX MICROSCOPIC
Bilirubin Urine: NEGATIVE
Glucose, UA: NEGATIVE
Glucose, UA: NEGATIVE
Hgb urine dipstick: NEGATIVE
Ketones, ur: NEGATIVE
Leukocytes, UA: NEGATIVE
Nitrite: NEGATIVE
Specific Gravity, Urine: 1.025
Specific Gravity, Urine: 1.022
pH: 5
pH: 5.5

## 2011-02-26 LAB — COMPREHENSIVE METABOLIC PANEL
ALT: 34
AST: 30
Albumin: 4.4
CO2: 22
Calcium: 10.4
Chloride: 105
Creatinine, Ser: 0.89
GFR calc Af Amer: >60
GFR calc non Af Amer: >60
Sodium: 138

## 2011-02-26 LAB — URINE MICROSCOPIC-ADD ON

## 2011-02-26 LAB — CBC
MCV: 90.7
Platelets: 268
RBC: 5.24
WBC: 15.8 — ABNORMAL HIGH

## 2011-02-27 LAB — DIFFERENTIAL
Basophils Absolute: 0
Eosinophils Relative: 1
Monocytes Absolute: 1
Monocytes Relative: 10
Neutrophils Relative %: 62

## 2011-02-27 LAB — URINALYSIS, ROUTINE W REFLEX MICROSCOPIC
Bilirubin Urine: NEGATIVE
Glucose, UA: NEGATIVE
Hgb urine dipstick: NEGATIVE
Specific Gravity, Urine: 1.021
Urobilinogen, UA: 0.2
pH: 5.5

## 2011-02-27 LAB — CBC
Hemoglobin: 16.6
RBC: 5.32
RDW: 13.2
WBC: 10

## 2011-02-27 LAB — COMPREHENSIVE METABOLIC PANEL
ALT: 22
Alkaline Phosphatase: 62
Glucose, Bld: 96
Potassium: 3.5
Sodium: 142
Total Protein: 7

## 2011-02-27 LAB — LIPASE, BLOOD: Lipase: 68 — ABNORMAL HIGH

## 2011-02-28 LAB — COMPREHENSIVE METABOLIC PANEL
ALT: 26
AST: 37
Albumin: 4.2
Alkaline Phosphatase: 76
CO2: 25
Chloride: 103
Creatinine, Ser: 0.91
GFR calc Af Amer: >60
Potassium: 3.9
Sodium: 135
Total Bilirubin: 1.2

## 2011-02-28 LAB — CBC
Platelets: 338
RBC: 5.28
WBC: 10.1

## 2011-02-28 LAB — URINALYSIS, ROUTINE W REFLEX MICROSCOPIC
Nitrite: NEGATIVE
Specific Gravity, Urine: 1.009
Urobilinogen, UA: 0.2
pH: 5

## 2011-02-28 LAB — DIFFERENTIAL
Basophils Absolute: 0
Eosinophils Absolute: 0.2
Eosinophils Relative: 2
Lymphocytes Relative: 23
Monocytes Absolute: 1

## 2011-02-28 LAB — AMYLASE: Amylase: 155 — ABNORMAL HIGH

## 2011-03-01 LAB — DIFFERENTIAL
Lymphs Abs: 2.3 10*3/uL (ref 0.7–4.0)
Monocytes Relative: 9 % (ref 3–12)
Neutro Abs: 10.4 10*3/uL — ABNORMAL HIGH (ref 1.7–7.7)
Neutrophils Relative %: 73 % (ref 43–77)

## 2011-03-01 LAB — CBC
HCT: 48.2 % (ref 39.0–52.0)
Hemoglobin: 16.8 g/dL (ref 13.0–17.0)
MCHC: 34.9 g/dL (ref 30.0–36.0)
Platelets: 280 10*3/uL (ref 150–400)
RDW: 12.9 % (ref 11.5–15.5)

## 2011-03-01 LAB — COMPREHENSIVE METABOLIC PANEL
Albumin: 4.2 g/dL (ref 3.5–5.2)
BUN: 10 mg/dL (ref 6–23)
Calcium: 9.9 mg/dL (ref 8.4–10.5)
Glucose, Bld: 93 mg/dL (ref 70–99)
Sodium: 138 mEq/L (ref 135–145)
Total Protein: 7 g/dL (ref 6.0–8.3)

## 2011-03-01 LAB — URINALYSIS, ROUTINE W REFLEX MICROSCOPIC
Glucose, UA: NEGATIVE mg/dL
Specific Gravity, Urine: 1.022 (ref 1.005–1.030)
pH: 5.5 (ref 5.0–8.0)

## 2011-03-02 LAB — URINALYSIS, ROUTINE W REFLEX MICROSCOPIC
Glucose, UA: NEGATIVE
Ketones, ur: NEGATIVE mg/dL
Leukocytes, UA: NEGATIVE
Nitrite: NEGATIVE
Specific Gravity, Urine: 1.021 (ref 1.005–1.030)
Specific Gravity, Urine: 1.028
pH: 6 (ref 5.0–8.0)
pH: 5.5

## 2011-03-02 LAB — LIPID PANEL
Cholesterol: 179
LDL Cholesterol: 113 — ABNORMAL HIGH

## 2011-03-02 LAB — CBC
HCT: 48.3 % (ref 39.0–52.0)
MCHC: 35.5
Platelets: 236 10*3/uL (ref 150–400)
Platelets: 232
RBC: 4.78
RDW: 12.6
WBC: 10.6 10*3/uL — ABNORMAL HIGH (ref 4.0–10.5)
WBC: 11.9 — ABNORMAL HIGH
WBC: 11.9 — ABNORMAL HIGH

## 2011-03-02 LAB — BASIC METABOLIC PANEL
BUN: 13 mg/dL (ref 6–23)
BUN: 6
Creatinine, Ser: 0.74
GFR calc non Af Amer: >60 mL/min (ref >60)
GFR calc non Af Amer: >60
Potassium: 3.9 mEq/L (ref 3.5–5.1)
Sodium: 144 mEq/L (ref 135–145)

## 2011-03-02 LAB — DIFFERENTIAL
Basophils Absolute: 0.1
Eosinophils Absolute: 0.1
Eosinophils Absolute: 0.2
Eosinophils Relative: 3 % (ref 0–5)
Eosinophils Relative: 1
Lymphocytes Relative: 35 % (ref 12–46)
Lymphocytes Relative: 29
Lymphs Abs: 3.6 10*3/uL (ref 0.7–4.0)
Lymphs Abs: 3.3
Lymphs Abs: 3.4
Monocytes Absolute: 1
Neutrophils Relative %: 56

## 2011-03-02 LAB — HEPATIC FUNCTION PANEL
AST: 33
Albumin: 3.9

## 2011-03-02 LAB — LIPASE, BLOOD: Lipase: 115 — ABNORMAL HIGH

## 2011-03-02 LAB — COMPREHENSIVE METABOLIC PANEL
ALT: 28
AST: 21
CO2: 25
Calcium: 9
Chloride: 109
GFR calc Af Amer: >60
GFR calc non Af Amer: >60
Sodium: 139

## 2011-03-02 LAB — URINE MICROSCOPIC-ADD ON

## 2011-03-02 LAB — PROTIME-INR
INR: 0.9
Prothrombin Time: 12.7

## 2011-03-02 LAB — SODIUM, URINE, RANDOM: Sodium, Ur: 150

## 2011-03-02 LAB — CULTURE, BLOOD (ROUTINE X 2): Culture: NO GROWTH

## 2011-03-02 LAB — ETHANOL: Alcohol, Ethyl (B): <5

## 2011-03-02 LAB — AMYLASE: Amylase: 253 — ABNORMAL HIGH

## 2011-03-09 LAB — COMPREHENSIVE METABOLIC PANEL
Albumin: 4.3
BUN: 13
Calcium: 9.4
Creatinine, Ser: 0.94
Potassium: 3.9
Total Protein: 7.2

## 2011-03-09 LAB — DIFFERENTIAL
Lymphocytes Relative: 27
Lymphs Abs: 2.9
Monocytes Absolute: 1.5 — ABNORMAL HIGH
Monocytes Relative: 14 — ABNORMAL HIGH
Neutro Abs: 6.2
Neutrophils Relative %: 57

## 2011-03-09 LAB — CBC
HCT: 45.9
MCHC: 35.5
MCV: 87.9
Platelets: 238
RDW: 12.4

## 2011-03-22 ENCOUNTER — Emergency Department (HOSPITAL_COMMUNITY)
Admission: EM | Admit: 2011-03-22 | Discharge: 2011-03-22 | Discharge status: Against Medical Advice | Payer: BC Managed Care – PPO | Attending: Emergency Medicine | Admitting: Emergency Medicine

## 2011-03-22 DIAGNOSIS — R109 Unspecified abdominal pain: Secondary | ICD-10-CM | POA: Insufficient documentation

## 2011-03-22 DIAGNOSIS — R112 Nausea with vomiting, unspecified: Secondary | ICD-10-CM | POA: Insufficient documentation

## 2011-03-22 LAB — DIFFERENTIAL
Basophils Absolute: 0 10*3/uL (ref 0.0–0.1)
Lymphocytes Relative: 34 % (ref 12–46)
Monocytes Absolute: 1.1 10*3/uL — ABNORMAL HIGH (ref 0.1–1.0)
Monocytes Relative: 13 % — ABNORMAL HIGH (ref 3–12)
Neutro Abs: 4.2 10*3/uL (ref 1.7–7.7)
Neutrophils Relative %: 49 % (ref 43–77)

## 2011-03-22 LAB — COMPREHENSIVE METABOLIC PANEL
ALT: 27 U/L (ref 0–53)
Alkaline Phosphatase: 61 U/L (ref 39–117)
BUN: 10 mg/dL (ref 6–23)
CO2: 23 mEq/L (ref 19–32)
GFR calc Af Amer: >90 mL/min (ref >90)
GFR calc non Af Amer: >90 mL/min (ref >90)
Glucose, Bld: 98 mg/dL (ref 70–99)
Potassium: 3.3 mEq/L — ABNORMAL LOW (ref 3.5–5.1)
Sodium: 136 mEq/L (ref 135–145)
Total Protein: 7.1 g/dL (ref 6.0–8.3)

## 2011-03-22 LAB — CBC
HCT: 42.6 % (ref 39.0–52.0)
Hemoglobin: 15.7 g/dL (ref 13.0–17.0)
MCH: 31.2 pg (ref 26.0–34.0)
MCHC: 36.9 g/dL — ABNORMAL HIGH (ref 30.0–36.0)
RBC: 5.03 MIL/uL (ref 4.22–5.81)

## 2011-03-22 LAB — LIPASE, BLOOD: Lipase: 193 U/L — ABNORMAL HIGH (ref 11–59)

## 2011-03-29 ENCOUNTER — Emergency Department (HOSPITAL_COMMUNITY)
Admission: EM | Admit: 2011-03-29 | Discharge: 2011-03-29 | Disposition: A | Discharge status: Home / Self Care | Payer: BC Managed Care – PPO | Attending: Emergency Medicine | Admitting: Emergency Medicine

## 2011-03-29 DIAGNOSIS — Z87442 Personal history of urinary calculi: Secondary | ICD-10-CM | POA: Insufficient documentation

## 2011-03-29 DIAGNOSIS — R109 Unspecified abdominal pain: Secondary | ICD-10-CM | POA: Insufficient documentation

## 2011-03-29 DIAGNOSIS — R110 Nausea: Secondary | ICD-10-CM | POA: Insufficient documentation

## 2011-03-29 DIAGNOSIS — R10816 Epigastric abdominal tenderness: Secondary | ICD-10-CM | POA: Insufficient documentation

## 2011-03-29 LAB — URINALYSIS, ROUTINE W REFLEX MICROSCOPIC
Bilirubin Urine: NEGATIVE
Hgb urine dipstick: NEGATIVE
Ketones, ur: NEGATIVE mg/dL
Nitrite: NEGATIVE
Protein, ur: NEGATIVE mg/dL
Specific Gravity, Urine: 1.018 (ref 1.005–1.030)
Urobilinogen, UA: 0.2 mg/dL (ref 0.0–1.0)

## 2011-03-29 LAB — CBC
Hemoglobin: 15 g/dL (ref 13.0–17.0)
MCH: 30.9 pg (ref 26.0–34.0)
MCHC: 35.7 g/dL (ref 30.0–36.0)
Platelets: 204 10*3/uL (ref 150–400)
RDW: 12.3 % (ref 11.5–15.5)

## 2011-03-29 LAB — DIFFERENTIAL
Basophils Absolute: 0.1 10*3/uL (ref 0.0–0.1)
Basophils Relative: 1 % (ref 0–1)
Eosinophils Absolute: 0.2 10*3/uL (ref 0.0–0.7)
Eosinophils Relative: 2 % (ref 0–5)
Monocytes Absolute: 1.3 10*3/uL — ABNORMAL HIGH (ref 0.1–1.0)
Monocytes Relative: 14 % — ABNORMAL HIGH (ref 3–12)

## 2011-03-29 LAB — COMPREHENSIVE METABOLIC PANEL
Albumin: 4.4 g/dL (ref 3.5–5.2)
BUN: 12 mg/dL (ref 6–23)
Chloride: 100 mEq/L (ref 96–112)
Creatinine, Ser: 0.78 mg/dL (ref 0.50–1.35)
Total Bilirubin: 0.3 mg/dL (ref 0.3–1.2)

## 2011-03-29 LAB — LIPASE, BLOOD: Lipase: 26 U/L (ref 11–59)

## 2011-04-03 ENCOUNTER — Encounter: Payer: Self-pay | Admitting: *Deleted

## 2011-04-03 ENCOUNTER — Emergency Department (HOSPITAL_COMMUNITY)
Admission: EM | Admit: 2011-04-03 | Discharge: 2011-04-04 | Disposition: A | Discharge status: Home / Self Care | Payer: BC Managed Care – PPO | Attending: Emergency Medicine | Admitting: Emergency Medicine

## 2011-04-03 DIAGNOSIS — R112 Nausea with vomiting, unspecified: Secondary | ICD-10-CM | POA: Insufficient documentation

## 2011-04-03 DIAGNOSIS — R109 Unspecified abdominal pain: Secondary | ICD-10-CM | POA: Insufficient documentation

## 2011-04-03 DIAGNOSIS — K869 Disease of pancreas, unspecified: Secondary | ICD-10-CM

## 2011-04-03 DIAGNOSIS — Z79899 Other long term (current) drug therapy: Secondary | ICD-10-CM | POA: Insufficient documentation

## 2011-04-03 DIAGNOSIS — F17200 Nicotine dependence, unspecified, uncomplicated: Secondary | ICD-10-CM | POA: Insufficient documentation

## 2011-04-03 DIAGNOSIS — R10812 Left upper quadrant abdominal tenderness: Secondary | ICD-10-CM | POA: Insufficient documentation

## 2011-04-03 NOTE — ED Notes (Signed)
Pt c/o lt lower chest or lt upper abd for 45 minutes. No known injury. Nauseated.Marland Kitchen  History of pancreatitis

## 2011-04-04 LAB — COMPREHENSIVE METABOLIC PANEL
ALT: 25 U/L (ref 0–53)
AST: 22 U/L (ref 0–37)
Alkaline Phosphatase: 60 U/L (ref 39–117)
CO2: 24 mEq/L (ref 19–32)
Chloride: 104 mEq/L (ref 96–112)
GFR calc non Af Amer: >90 mL/min (ref >90)
Potassium: 3 mEq/L — ABNORMAL LOW (ref 3.5–5.1)
Sodium: 140 mEq/L (ref 135–145)
Total Bilirubin: 0.6 mg/dL (ref 0.3–1.2)

## 2011-04-04 LAB — CBC
HCT: 41.1 % (ref 39.0–52.0)
Platelets: 206 10*3/uL (ref 150–400)
RDW: 12.4 % (ref 11.5–15.5)
WBC: 11 10*3/uL — ABNORMAL HIGH (ref 4.0–10.5)

## 2011-04-04 LAB — DIFFERENTIAL
Basophils Absolute: 0.1 10*3/uL (ref 0.0–0.1)
Lymphocytes Relative: 35 % (ref 12–46)
Monocytes Absolute: 1.2 10*3/uL — ABNORMAL HIGH (ref 0.1–1.0)
Neutro Abs: 5.7 10*3/uL (ref 1.7–7.7)
Neutrophils Relative %: 52 % (ref 43–77)

## 2011-04-04 MED ORDER — OXYCODONE-ACETAMINOPHEN 5-325 MG PO TABS: 2.0000 | ORAL_TABLET | ORAL | Status: AC | PRN

## 2011-04-04 MED ORDER — SODIUM CHLORIDE 0.9 % IV SOLN
999.0000 mL | INTRAVENOUS | Status: DC
  Administered 2011-04-04: 999 mL via INTRAVENOUS

## 2011-04-04 MED ORDER — HYDROMORPHONE HCL PF 1 MG/ML IJ SOLN
1.0000 mg | Freq: Once | INTRAMUSCULAR | Status: AC
  Administered 2011-04-04: 1 mg via INTRAVENOUS
  Filled 2011-04-04 (×2): qty 1

## 2011-04-04 MED ORDER — ONDANSETRON HCL 4 MG/2ML IJ SOLN
4.0000 mg | Freq: Once | INTRAMUSCULAR | Status: AC
  Administered 2011-04-04: 4 mg via INTRAVENOUS
  Filled 2011-04-04: qty 2

## 2011-04-04 MED ORDER — MORPHINE SULFATE 4 MG/ML IJ SOLN
4.0000 mg | Freq: Once | INTRAMUSCULAR | Status: AC
  Administered 2011-04-04: 4 mg via INTRAVENOUS
  Filled 2011-04-04: qty 1

## 2011-04-04 MED ORDER — PROMETHAZINE HCL 25 MG PO TABS: 25.0000 mg | ORAL_TABLET | Freq: Four times a day (QID) | ORAL | Status: DC | PRN

## 2011-04-04 NOTE — ED Notes (Signed)
ASSUMED CARE ON PT . INTRODUCED SELF,CALL  LIGHT WITHIN REACH , WAITING FOR EDP EVALUATION. 

## 2011-04-04 NOTE — ED Notes (Signed)
RESTING WITH NO PAIN ,RESPIRATIONS UNLABORED , IV SITE UNREMARKABLE.

## 2011-04-04 NOTE — ED Provider Notes (Signed)
History     CSN: [id number] Arrival date & time: 04/03/2011  7:54 PM   First MD Initiated Contact with Patient 04/04/11 0042      Chief Complaint  Patient presents with  . Abdominal Pain    (Consider location/radiation/quality/duration/timing/severity/associated sxs/prior treatment) HPI Comments: Patient with Hx traumatic Pancreatitis now with pain execration   Patient is a 35 y.o. male presenting with abdominal pain. The history is provided by the patient.  Abdominal Pain The primary symptoms of the illness include abdominal pain, nausea and vomiting. The primary symptoms of the illness do not include diarrhea. The current episode started 3 to 5 hours ago. The onset of the illness was gradual. The problem has been gradually worsening.  Symptoms associated with the illness do not include chills, diaphoresis or constipation.    Past Medical History  Diagnosis Date  . Pancreatitis     History reviewed. No pertinent past surgical history.  No family history on file.  History  Substance Use Topics  . Smoking status: Current Everyday Smoker  . Smokeless tobacco: Not on file  . Alcohol Use: No      Review of Systems  Constitutional: Negative for chills and diaphoresis.  HENT: Negative.   Eyes: Negative.   Respiratory: Negative.   Cardiovascular: Negative.   Gastrointestinal: Positive for nausea, vomiting and abdominal pain. Negative for diarrhea and constipation.  Genitourinary: Negative.   Musculoskeletal: Negative for myalgias.  Neurological: Negative.   Hematological: Negative.   Psychiatric/Behavioral: Negative.     Allergies  Cortisone and Toradol  Home Medications   Current Outpatient Rx  Name Route Sig Dispense Refill  . AMYLASE-LIPASE-PROTEASE 05-01-11 MU PO CPEP Oral Take 1 capsule by mouth 3 (three) times daily with meals.        BP 131/96  Pulse 111  Temp(Src) 97.8 F (36.6 C) (Oral)  Resp 22  SpO2 97%  Physical Exam  Constitutional: He is  oriented to person, place, and time. He appears well-developed.  HENT:  Head: Normocephalic.  Eyes: Pupils are equal, round, and reactive to light.  Neck: Normal range of motion.  Cardiovascular: Normal rate.   Pulmonary/Chest: Breath sounds normal.  Abdominal: He exhibits no distension, no pulsatile liver, no ascites and no mass. There is no hepatosplenomegaly. There is tenderness in the left upper quadrant. There is no rebound and no guarding.    Musculoskeletal: Normal range of motion.  Neurological: He is alert and oriented to person, place, and time.  Skin: Skin is warm and dry.  Psychiatric: He has a normal mood and affect.    ED Course  Procedures (including critical care time)  Labs Reviewed  CBC - Abnormal; Notable for the following:    WBC 11.0 (*)    All other components within normal limits  DIFFERENTIAL - Abnormal; Notable for the following:    Monocytes Absolute 1.2 (*)    All other components within normal limits  COMPREHENSIVE METABOLIC PANEL - Abnormal; Notable for the following:    Potassium 3.0 (*)    All other components within normal limits  LIPASE, BLOOD   No results found.   No diagnosis found.    MDM  Pancreatitis will check labs provide pain and antiemetics IV fluids  that reassess  .3:46 AM  Pain decreased tolerating PO  Fluids will DC home with  Pain control, antiemetics and follow up with PCP    Arman Filter, NP 04/04/11 [phone]

## 2011-04-04 NOTE — ED Notes (Signed)
PT. RESTING WITH NO PAIN . NO NAUSEA. IV SITE INTACT.

## 2011-04-04 NOTE — ED Provider Notes (Signed)
Medical screening examination/treatment/procedure(s) were performed by non-physician practitioner and as supervising physician I was immediately available for consultation/collaboration.   Juliet Rude. Rubin Payor, MD 04/04/11 [phone]

## 2011-04-29 ENCOUNTER — Emergency Department (HOSPITAL_COMMUNITY)
Admission: EM | Admit: 2011-04-29 | Discharge: 2011-04-29 | Disposition: A | Discharge status: Home / Self Care | Payer: Self-pay | Attending: Emergency Medicine | Admitting: Emergency Medicine

## 2011-04-29 ENCOUNTER — Encounter (HOSPITAL_COMMUNITY): Payer: Self-pay | Admitting: Emergency Medicine

## 2011-04-29 DIAGNOSIS — G8929 Other chronic pain: Secondary | ICD-10-CM | POA: Insufficient documentation

## 2011-04-29 DIAGNOSIS — Z9089 Acquired absence of other organs: Secondary | ICD-10-CM | POA: Insufficient documentation

## 2011-04-29 DIAGNOSIS — R109 Unspecified abdominal pain: Secondary | ICD-10-CM | POA: Insufficient documentation

## 2011-04-29 DIAGNOSIS — R10812 Left upper quadrant abdominal tenderness: Secondary | ICD-10-CM | POA: Insufficient documentation

## 2011-04-29 DIAGNOSIS — R112 Nausea with vomiting, unspecified: Secondary | ICD-10-CM | POA: Insufficient documentation

## 2011-04-29 HISTORY — DX: Calculus of kidney: N20.0

## 2011-04-29 LAB — DIFFERENTIAL
Basophils Absolute: 0 10*3/uL (ref 0.0–0.1)
Lymphocytes Relative: 28 % (ref 12–46)
Monocytes Absolute: 1.1 10*3/uL — ABNORMAL HIGH (ref 0.1–1.0)
Neutro Abs: 6.8 10*3/uL (ref 1.7–7.7)
Neutrophils Relative %: 62 % (ref 43–77)

## 2011-04-29 LAB — COMPREHENSIVE METABOLIC PANEL
Alkaline Phosphatase: 57 U/L (ref 39–117)
BUN: 11 mg/dL (ref 6–23)
CO2: 23 mEq/L (ref 19–32)
Calcium: 9.7 mg/dL (ref 8.4–10.5)
GFR calc Af Amer: >90 mL/min (ref >90)
GFR calc non Af Amer: >90 mL/min (ref >90)
Glucose, Bld: 100 mg/dL — ABNORMAL HIGH (ref 70–99)
Total Protein: 7.2 g/dL (ref 6.0–8.3)

## 2011-04-29 LAB — CBC
HCT: 41.3 % (ref 39.0–52.0)
Hemoglobin: 15.3 g/dL (ref 13.0–17.0)
RDW: 12.5 % (ref 11.5–15.5)
WBC: 11 10*3/uL — ABNORMAL HIGH (ref 4.0–10.5)

## 2011-04-29 MED ORDER — GI COCKTAIL ~~LOC~~
30.0000 mL | Freq: Once | ORAL | Status: AC
  Administered 2011-04-29: 30 mL via ORAL
  Filled 2011-04-29: qty 30

## 2011-04-29 MED ORDER — PANTOPRAZOLE SODIUM 20 MG PO TBEC: 20.0000 mg | DELAYED_RELEASE_TABLET | Freq: Every day | ORAL | Status: DC

## 2011-04-29 MED ORDER — PANTOPRAZOLE SODIUM 40 MG IV SOLR
40.0000 mg | Freq: Once | INTRAVENOUS | Status: AC
  Administered 2011-04-29: 40 mg via INTRAVENOUS
  Filled 2011-04-29: qty 40

## 2011-04-29 MED ORDER — MORPHINE SULFATE 4 MG/ML IJ SOLN
4.0000 mg | Freq: Once | INTRAMUSCULAR | Status: AC
  Administered 2011-04-29: 4 mg via INTRAVENOUS
  Filled 2011-04-29: qty 1

## 2011-04-29 MED ORDER — POTASSIUM CHLORIDE CRYS ER 20 MEQ PO TBCR
40.0000 meq | EXTENDED_RELEASE_TABLET | Freq: Two times a day (BID) | ORAL | Status: DC
  Administered 2011-04-29: 40 meq via ORAL
  Filled 2011-04-29: qty 2

## 2011-04-29 MED ORDER — SODIUM CHLORIDE 0.9 % IV SOLN
INTRAVENOUS | Status: DC
  Administered 2011-04-29: 10:00:00 via INTRAVENOUS

## 2011-04-29 MED ORDER — ONDANSETRON HCL 4 MG/2ML IJ SOLN
4.0000 mg | Freq: Once | INTRAMUSCULAR | Status: AC
  Administered 2011-04-29: 4 mg via INTRAVENOUS
  Filled 2011-04-29: qty 2

## 2011-04-29 MED ORDER — GLYCOPYRROLATE 0.2 MG/ML IJ SOLN
0.2000 mg | Freq: Once | INTRAMUSCULAR | Status: AC
  Administered 2011-04-29: 0.2 mg via INTRAVENOUS
  Filled 2011-04-29: qty 1

## 2011-04-29 NOTE — ED Notes (Signed)
MD at bedside. 

## 2011-04-29 NOTE — ED Notes (Addendum)
Pt states that abd pain started about 2-230 this am, pt further explains that has Hx of pancreatitis and that usually when medications are changed that it flares up. Pt states he was given new medication on week before Thanksgiving. Pain in the upper left quad. 10/10. N/V in the last couple of time x8

## 2011-04-29 NOTE — ED Provider Notes (Signed)
History     CSN: [id number] Arrival date & time: 04/29/2011  5:31 AM   First MD Initiated Contact with Patient 04/29/11 [phone]      Chief Complaint  Patient presents with  . Abdominal Pain  . Pancreatitis    (Consider location/radiation/quality/duration/timing/severity/associated sxs/prior treatment) Patient is a 35 y.o. male presenting with abdominal pain. The history is provided by the patient and medical records.  Abdominal Pain The primary symptoms of the illness include abdominal pain, nausea and vomiting. The primary symptoms of the illness do not include fever, shortness of breath or diarrhea.  Symptoms associated with the illness do not include diaphoresis or back pain.   the patient is a 35 year old, male, with chronic abdominal pain.  He reports having history of multiple doses of pancreatitis.  He states that his pancreatic medications were increased.  Approximately 3 weeks ago.  Last night.  He began having left upper abdominal pain with nausea and vomiting.  He denies fevers, chills, diarrhea, cough, or shortness of breath.  He denies drinking alcohol.  He does not have blood in his emesis.  He denies having history of peptic ulcer.  No past.  Notably at the beginning of November.  He was in the emergency department for the same thing complaining of pancreatitis, but his lipase level is normal.  Past Medical History  Diagnosis Date  . Pancreatitis   . Pancreatitis   . Kidney stones     Past Surgical History  Procedure Date  . Appendectomy   . Cholecystectomy     History reviewed. No pertinent family history.  History  Substance Use Topics  . Smoking status: Current Everyday Smoker  . Smokeless tobacco: Not on file  . Alcohol Use: No      Review of Systems  Constitutional: Negative for fever and diaphoresis.  HENT: Negative for neck pain.   Eyes: Negative for redness.  Respiratory: Negative for cough, chest tightness and shortness of breath.     Cardiovascular: Negative for chest pain and palpitations.  Gastrointestinal: Positive for nausea, vomiting and abdominal pain. Negative for diarrhea and abdominal distention.  Musculoskeletal: Negative for back pain.  Skin: Negative for rash.  Neurological: Negative for headaches.  Psychiatric/Behavioral: Negative for confusion.    Allergies  Cortisone and Toradol  Home Medications   Current Outpatient Rx  Name Route Sig Dispense Refill  . AMYLASE-LIPASE-PROTEASE 05-01-11 MU PO CPEP Oral Take 1 capsule by mouth 3 (three) times daily with meals.        BP 151/103  Pulse 108  Temp(Src) 98.1 F (36.7 C) (Oral)  SpO2 100%  Physical Exam  Constitutional: He is oriented to person, place, and time. He appears well-developed and well-nourished.       Uncomfortable appearing, lying in a fetal position, holding his left upper quadrant  HENT:  Head: Normocephalic and atraumatic.  Eyes: EOM are normal. Pupils are equal, round, and reactive to light.  Neck: Normal range of motion. Neck supple.  Cardiovascular: Normal rate, regular rhythm and normal heart sounds.   No murmur heard. Pulmonary/Chest: Effort normal and breath sounds normal. No respiratory distress. He has no wheezes. He has no rales.  Abdominal: Soft. Bowel sounds are normal. He exhibits no distension and no mass. There is tenderness. There is no rebound and no guarding.       Mild left upper quadrant tenderness, with no peritoneal signs  Musculoskeletal: Normal range of motion. He exhibits no edema and no tenderness.  Neurological: He is alert  and oriented to person, place, and time. No cranial nerve deficit.  Skin: Skin is warm and dry. He is not diaphoretic.  Psychiatric: He has a normal mood and affect. His behavior is normal.    ED Course  Procedures (including critical care time) 35 year old male, with a reported history of pancreatitis presents with left upper quadrant pain, and nausea and vomiting since this  morning.  He has no acute abdomen.  Review of his records reveals multiple episodes of similar symptoms with Leory Plowman elevated lipase level in the past.  We'll establish an IV and give him analgesics including a GI cocktail and morphine and antiemetics.  I will check his laboratory tests.  Again, and administer more analgesics as needed.  Provided that his lipase level is elevated.  I am very suspicious that this patient is just seeking analgesic pain medications.  I seen in the emergency department myself.  Multiple times for similar symptoms.  In addition, his primary care doctor is in [place] where there is no emergency department, however, he has come up with [facility] to the emergency department.   Labs Reviewed  COMPREHENSIVE METABOLIC PANEL  CBC  DIFFERENTIAL  LIPASE, BLOOD       MDM  Chronic abdominal pain No acute abdomen.  No evidence of acute pancreatitis.  At this time        Nicholes Stairs, MD 04/29/11 1236

## 2011-04-29 NOTE — ED Notes (Signed)
Patient stated that he was still in pain made RN aware of it

## 2011-04-29 NOTE — ED Notes (Signed)
Patient is resting comfortably. 

## 2011-05-06 ENCOUNTER — Emergency Department (HOSPITAL_COMMUNITY)
Admission: EM | Admit: 2011-05-06 | Discharge: 2011-05-06 | Disposition: A | Discharge status: Home / Self Care | Payer: Self-pay | Attending: Emergency Medicine | Admitting: Emergency Medicine

## 2011-05-06 ENCOUNTER — Encounter (HOSPITAL_COMMUNITY): Payer: Self-pay | Admitting: Emergency Medicine

## 2011-05-06 DIAGNOSIS — R112 Nausea with vomiting, unspecified: Secondary | ICD-10-CM | POA: Insufficient documentation

## 2011-05-06 DIAGNOSIS — R109 Unspecified abdominal pain: Secondary | ICD-10-CM | POA: Insufficient documentation

## 2011-05-06 DIAGNOSIS — K861 Other chronic pancreatitis: Secondary | ICD-10-CM | POA: Insufficient documentation

## 2011-05-06 DIAGNOSIS — M549 Dorsalgia, unspecified: Secondary | ICD-10-CM | POA: Insufficient documentation

## 2011-05-06 DIAGNOSIS — F17200 Nicotine dependence, unspecified, uncomplicated: Secondary | ICD-10-CM | POA: Insufficient documentation

## 2011-05-06 LAB — COMPREHENSIVE METABOLIC PANEL
ALT: 17 U/L (ref 0–53)
Alkaline Phosphatase: 68 U/L (ref 39–117)
BUN: 11 mg/dL (ref 6–23)
CO2: 20 mEq/L (ref 19–32)
Chloride: 110 mEq/L (ref 96–112)
GFR calc Af Amer: >90 mL/min (ref >90)
GFR calc non Af Amer: >90 mL/min (ref >90)
Glucose, Bld: 120 mg/dL — ABNORMAL HIGH (ref 70–99)
Potassium: 2.7 mEq/L — CL (ref 3.5–5.1)
Sodium: 144 mEq/L (ref 135–145)
Total Bilirubin: 0.2 mg/dL — ABNORMAL LOW (ref 0.3–1.2)

## 2011-05-06 LAB — DIFFERENTIAL
Eosinophils Absolute: 0.2 10*3/uL (ref 0.0–0.7)
Lymphocytes Relative: 40 % (ref 12–46)
Lymphs Abs: 5.3 10*3/uL — ABNORMAL HIGH (ref 0.7–4.0)
Monocytes Relative: 11 % (ref 3–12)
Neutro Abs: 6.2 10*3/uL (ref 1.7–7.7)
Neutrophils Relative %: 47 % (ref 43–77)

## 2011-05-06 LAB — CBC
Hemoglobin: 14.8 g/dL (ref 13.0–17.0)
MCH: 31.4 pg (ref 26.0–34.0)
Platelets: 212 10*3/uL (ref 150–400)
RBC: 4.71 MIL/uL (ref 4.22–5.81)
WBC: 13.1 10*3/uL — ABNORMAL HIGH (ref 4.0–10.5)

## 2011-05-06 LAB — AMYLASE: Amylase: 62 U/L (ref 0–105)

## 2011-05-06 LAB — LIPASE, BLOOD: Lipase: 67 U/L — ABNORMAL HIGH (ref 11–59)

## 2011-05-06 MED ORDER — POTASSIUM CHLORIDE 10 MEQ/100ML IV SOLN
10.0000 meq | INTRAVENOUS | Status: DC
  Administered 2011-05-06 (×2): 10 meq via INTRAVENOUS
  Filled 2011-05-06 (×2): qty 100

## 2011-05-06 MED ORDER — ONDANSETRON HCL 4 MG/2ML IJ SOLN
INTRAMUSCULAR | Status: AC
  Administered 2011-05-06: 4 mg
  Filled 2011-05-06: qty 2

## 2011-05-06 MED ORDER — HYDROMORPHONE HCL PF 1 MG/ML IJ SOLN
1.0000 mg | Freq: Once | INTRAMUSCULAR | Status: AC
  Administered 2011-05-06: 1 mg via INTRAVENOUS
  Filled 2011-05-06: qty 1

## 2011-05-06 MED ORDER — PROMETHAZINE HCL 25 MG PO TABS: 25.0000 mg | ORAL_TABLET | Freq: Four times a day (QID) | ORAL | Status: DC | PRN

## 2011-05-06 MED ORDER — OXYCODONE-ACETAMINOPHEN 5-325 MG PO TABS: 1.0000 | ORAL_TABLET | Freq: Four times a day (QID) | ORAL | Status: AC | PRN

## 2011-05-06 MED ORDER — PROMETHAZINE HCL 25 MG/ML IJ SOLN
25.0000 mg | INTRAMUSCULAR | Status: AC
  Administered 2011-05-06: 25 mg via INTRAVENOUS
  Filled 2011-05-06: qty 1

## 2011-05-06 MED ORDER — POTASSIUM CHLORIDE CRYS ER 20 MEQ PO TBCR: 20.0000 meq | EXTENDED_RELEASE_TABLET | Freq: Two times a day (BID) | ORAL | Status: DC

## 2011-05-06 NOTE — ED Notes (Signed)
Pt has hx of pancreatitis. Pt seen last week and sent home. Pt arrives tonight with RUQ pain and n/v. Pt also diaphoretic.

## 2011-05-06 NOTE — ED Provider Notes (Signed)
History     CSN: [id number] Arrival date & time: 05/06/2011  3:51 AM   First MD Initiated Contact with Patient 05/06/11 [phone]      Chief Complaint  Patient presents with  . Abdominal Pain        (Consider location/radiation/quality/duration/timing/severity/associated sxs/prior treatment) HPI This is a 35 year old white male with a history of episodic pancreatitis. He awoke this morning about 1:30 with severe epigastric and right upper quadrant pain radiating to the back. Is consistent with prior episodes of pancreatitis. He has had some milder episodes in the past 3 months but states this is the worse he's had in about 6 months. He states he has not had any dietary indiscretions recently, sticking to clear liquids. The pain is associated with nausea and retching. He took Pepto-Bismol without any relief. He has not had diarrhea, fever, chest pain or shortness of breath with this.  Past Medical History  Diagnosis Date  . Pancreatitis   . Pancreatitis   . Kidney stones     Past Surgical History  Procedure Date  . Appendectomy   . Cholecystectomy     History reviewed. No pertinent family history.  History  Substance Use Topics  . Smoking status: Current Everyday Smoker  . Smokeless tobacco: Not on file  . Alcohol Use: No      Review of Systems  All other systems reviewed and are negative.    Allergies  Cortisone and Toradol  Home Medications   Current Outpatient Rx  Name Route Sig Dispense Refill  . AMYLASE-LIPASE-PROTEASE 05-01-11 MU PO CPEP Oral Take 1 capsule by mouth 3 (three) times daily with meals.      Marland Kitchen PANTOPRAZOLE SODIUM 20 MG PO TBEC Oral Take 1 tablet (20 mg total) by mouth daily. 30 tablet 0    BP 154/88  Pulse 70  Temp(Src) 98.3 F (36.8 C) (Oral)  Resp 20  SpO2 100%  Physical Exam General: Well-developed, well-nourished male in no acute distress; appearance consistent with age of record; appears uncomfortable HENT: normocephalic,  atraumatic Eyes: pupils equal round and reactive to light; extraocular muscles intact Neck: supple Heart: regular rate and rhythm Lungs: clear to auscultation bilaterally Abdomen: soft; moderate to severe epigastric right upper quadrant tenderness; nondistended; bowel sounds present Extremities: No deformity; full range of motion Neurologic: Awake, alert and oriented; motor function intact in all extremities and symmetric; no facial droop Skin: Warm and dry Psychiatric: Anxious    ED Course  Procedures (including critical care time)    MDM   Nursing notes and vitals signs, including pulse oximetry, reviewed.  Summary of this visit's results, reviewed by myself:  Labs:  Results for orders placed during the hospital encounter of 05/06/11  CBC      Component Value Range   WBC 13.1 (*) 4.0 - 10.5 (K/uL)   RBC 4.71  4.22 - 5.81 (MIL/uL)   Hemoglobin 14.8  13.0 - 17.0 (g/dL)   HCT 11.9  14.7 - 82.9 (%)   MCV 84.9  78.0 - 100.0 (fL)   MCH 31.4  26.0 - 34.0 (pg)   MCHC 37.0 (*) 30.0 - 36.0 (g/dL)   RDW 56.2  13.0 - 86.5 (%)   Platelets 212  150 - 400 (K/uL)  DIFFERENTIAL      Component Value Range   Neutrophils Relative 47  43 - 77 (%)   Neutro Abs 6.2  1.7 - 7.7 (K/uL)   Lymphocytes Relative 40  12 - 46 (%)   Lymphs Abs  5.3 (*) 0.7 - 4.0 (K/uL)   Monocytes Relative 11  3 - 12 (%)   Monocytes Absolute 1.4 (*) 0.1 - 1.0 (K/uL)   Eosinophils Relative 2  0 - 5 (%)   Eosinophils Absolute 0.2  0.0 - 0.7 (K/uL)   Basophils Relative 1  0 - 1 (%)   Basophils Absolute 0.1  0.0 - 0.1 (K/uL)  COMPREHENSIVE METABOLIC PANEL      Component Value Range   Sodium 144  135 - 145 (mEq/L)   Potassium 2.7 (*) 3.5 - 5.1 (mEq/L)   Chloride 110  96 - 112 (mEq/L)   CO2 20  19 - 32 (mEq/L)   Glucose, Bld 120 (*) 70 - 99 (mg/dL)   BUN 11  6 - 23 (mg/dL)   Creatinine, Ser 1.47  0.50 - 1.35 (mg/dL)   Calcium 9.2  8.4 - 82.9 (mg/dL)   Total Protein 6.7  6.0 - 8.3 (g/dL)   Albumin 3.8  3.5 - 5.2  (g/dL)   AST 18  0 - 37 (U/L)   ALT 17  0 - 53 (U/L)   Alkaline Phosphatase 68  39 - 117 (U/L)   Total Bilirubin 0.2 (*) 0.3 - 1.2 (mg/dL)   GFR calc non Af Amer >90  >90 (mL/min)   GFR calc Af Amer >90  >90 (mL/min)  AMYLASE      Component Value Range   Amylase 62  0 - 105 (U/L)  LIPASE, BLOOD      Component Value Range   Lipase 67 (*) 11 - 59 (U/L)   7:10 AM Patient feeling much better. He wants to go home at this time. He will followup with his physician tomorrow morning. She requests prescriptions for pain and nausea relief in the meantime. We will also supplement his potassium.        Hanley Seamen, MD 05/06/11 [phone]

## 2011-06-01 ENCOUNTER — Encounter (HOSPITAL_COMMUNITY): Payer: Self-pay | Admitting: Emergency Medicine

## 2011-06-01 ENCOUNTER — Emergency Department (HOSPITAL_COMMUNITY)
Admission: EM | Admit: 2011-06-01 | Discharge: 2011-06-01 | Disposition: A | Discharge status: Home / Self Care | Payer: BC Managed Care – PPO | Attending: Emergency Medicine | Admitting: Emergency Medicine

## 2011-06-01 DIAGNOSIS — M549 Dorsalgia, unspecified: Secondary | ICD-10-CM | POA: Insufficient documentation

## 2011-06-01 DIAGNOSIS — Z79899 Other long term (current) drug therapy: Secondary | ICD-10-CM | POA: Insufficient documentation

## 2011-06-01 DIAGNOSIS — Z87442 Personal history of urinary calculi: Secondary | ICD-10-CM | POA: Insufficient documentation

## 2011-06-01 DIAGNOSIS — K861 Other chronic pancreatitis: Secondary | ICD-10-CM | POA: Insufficient documentation

## 2011-06-01 DIAGNOSIS — R112 Nausea with vomiting, unspecified: Secondary | ICD-10-CM | POA: Insufficient documentation

## 2011-06-01 DIAGNOSIS — R1013 Epigastric pain: Secondary | ICD-10-CM | POA: Insufficient documentation

## 2011-06-01 DIAGNOSIS — F17200 Nicotine dependence, unspecified, uncomplicated: Secondary | ICD-10-CM | POA: Insufficient documentation

## 2011-06-01 DIAGNOSIS — R10819 Abdominal tenderness, unspecified site: Secondary | ICD-10-CM | POA: Insufficient documentation

## 2011-06-01 LAB — CBC
HCT: 46.2 % (ref 39.0–52.0)
MCHC: 36.6 g/dL — ABNORMAL HIGH (ref 30.0–36.0)
Platelets: 243 10*3/uL (ref 150–400)
RDW: 12.5 % (ref 11.5–15.5)
WBC: 9.1 10*3/uL (ref 4.0–10.5)

## 2011-06-01 LAB — URINALYSIS, ROUTINE W REFLEX MICROSCOPIC
Glucose, UA: NEGATIVE mg/dL
Hgb urine dipstick: NEGATIVE
Ketones, ur: NEGATIVE mg/dL
Leukocytes, UA: NEGATIVE
pH: 5.5 (ref 5.0–8.0)

## 2011-06-01 LAB — COMPREHENSIVE METABOLIC PANEL
ALT: 22 U/L (ref 0–53)
AST: 22 U/L (ref 0–37)
Albumin: 4.3 g/dL (ref 3.5–5.2)
Alkaline Phosphatase: 61 U/L (ref 39–117)
CO2: 23 mEq/L (ref 19–32)
Chloride: 103 mEq/L (ref 96–112)
Creatinine, Ser: 0.89 mg/dL (ref 0.50–1.35)
GFR calc non Af Amer: >90 mL/min (ref >90)
Potassium: 3.3 mEq/L — ABNORMAL LOW (ref 3.5–5.1)
Sodium: 137 mEq/L (ref 135–145)
Total Bilirubin: 0.5 mg/dL (ref 0.3–1.2)

## 2011-06-01 LAB — DIFFERENTIAL
Basophils Absolute: 0.1 10*3/uL (ref 0.0–0.1)
Basophils Relative: 1 % (ref 0–1)
Lymphocytes Relative: 40 % (ref 12–46)
Neutro Abs: 4.2 10*3/uL (ref 1.7–7.7)
Neutrophils Relative %: 47 % (ref 43–77)

## 2011-06-01 MED ORDER — HYDROMORPHONE HCL PF 1 MG/ML IJ SOLN
1.0000 mg | Freq: Once | INTRAMUSCULAR | Status: AC
  Administered 2011-06-01: 1 mg via INTRAVENOUS
  Filled 2011-06-01: qty 1

## 2011-06-01 MED ORDER — HYDROMORPHONE HCL PF 2 MG/ML IJ SOLN
2.0000 mg | Freq: Once | INTRAMUSCULAR | Status: AC
  Administered 2011-06-01: 2 mg via INTRAVENOUS
  Filled 2011-06-01: qty 1

## 2011-06-01 MED ORDER — ONDANSETRON HCL 4 MG/2ML IJ SOLN
4.0000 mg | Freq: Once | INTRAMUSCULAR | Status: AC
  Administered 2011-06-01: 4 mg via INTRAVENOUS
  Filled 2011-06-01: qty 2

## 2011-06-01 MED ORDER — HYDROCODONE-ACETAMINOPHEN 5-325 MG PO TABS: 1.0000 | ORAL_TABLET | Freq: Four times a day (QID) | ORAL | Status: AC | PRN

## 2011-06-01 MED ORDER — SODIUM CHLORIDE 0.9 % IV BOLUS (SEPSIS)
1000.0000 mL | Freq: Once | INTRAVENOUS | Status: AC
  Administered 2011-06-01: 1000 mL via INTRAVENOUS

## 2011-06-01 MED ORDER — PROMETHAZINE HCL 25 MG PO TABS: 25.0000 mg | ORAL_TABLET | Freq: Four times a day (QID) | ORAL | Status: DC | PRN

## 2011-06-01 NOTE — ED Notes (Signed)
PT. REPORTS RUQ PAIN WITH VOMITTING , NO DIARRHEA , DENIES FEVER OR CHILLS ,  NO DYSURIA.

## 2011-06-01 NOTE — ED Provider Notes (Signed)
History     CSN: [id number]  Arrival date & time 06/01/11  0321   First MD Initiated Contact with Patient 06/01/11 0604    6:20 AM HPI Patient reports waking up 3 hours prior severe epigastric pain radiating straight through to his back states pain is typical for his pancreatitis pain. Reports pain associated with nausea and vomiting. States he Dr. Randa Evens adjusted his medications 2 weeks ago, which usually aggravates his pancreatitis. Denies urinary symptoms, fever, diarrhea, constipation or change from typical symptoms.  Patient is a 36 y.o. male presenting with abdominal pain. The history is provided by the patient.  Abdominal Pain The primary symptoms of the illness include abdominal pain, nausea and vomiting. The primary symptoms of the illness do not include fever, shortness of breath, diarrhea or dysuria. The current episode started 3 to 5 hours ago. The onset of the illness was gradual. The problem has been rapidly worsening.  The abdominal pain radiates to the back. The severity of the abdominal pain is 8/10. The abdominal pain is relieved by nothing.  The patient has not had a change in bowel habit. Symptoms associated with the illness do not include chills, constipation, urgency, hematuria, frequency or back pain. Associated medical issues comments: h/o chronic pancreatitis.    Past Medical History  Diagnosis Date  . Pancreatitis   . Pancreatitis   . Kidney stones     Past Surgical History  Procedure Date  . Appendectomy   . Cholecystectomy     No family history on file.  History  Substance Use Topics  . Smoking status: Current Everyday Smoker  . Smokeless tobacco: Not on file  . Alcohol Use: No      Review of Systems  Constitutional: Negative for fever and chills.  Respiratory: Negative for shortness of breath.   Cardiovascular: Negative for chest pain.  Gastrointestinal: Positive for nausea, vomiting and abdominal pain. Negative for diarrhea, constipation,  blood in stool and rectal pain.  Genitourinary: Negative for dysuria, urgency, frequency, hematuria, flank pain, discharge, penile pain and testicular pain.  Musculoskeletal: Negative for back pain.  Neurological: Negative for dizziness, weakness, numbness and headaches.  All other systems reviewed and are negative.    Allergies  Cortisone and Toradol  Home Medications   Current Outpatient Rx  Name Route Sig Dispense Refill  . AMYLASE-LIPASE-PROTEASE 05-01-11 MU PO CPEP Oral Take 1 capsule by mouth 2 (two) times daily with breakfast and lunch.     Marland Kitchen PANTOPRAZOLE SODIUM 20 MG PO TBEC Oral Take 1 tablet (20 mg total) by mouth daily. 30 tablet 0  . POTASSIUM CHLORIDE CRYS ER 20 MEQ PO TBCR Oral Take 1 tablet (20 mEq total) by mouth 2 (two) times daily. 10 tablet 0    BP 136/92  Pulse 120  Temp(Src) 98.1 F (36.7 C) (Oral)  Resp 18  SpO2 96%  Physical Exam  Constitutional: He is oriented to person, place, and time. He appears well-developed and well-nourished.  HENT:  Head: Normocephalic and atraumatic.  Eyes: Conjunctivae are normal. Pupils are equal, round, and reactive to light.  Neck: Normal range of motion. Neck supple.  Cardiovascular: Regular rhythm and normal heart sounds.  Tachycardia present.   Pulmonary/Chest: Effort normal and breath sounds normal.  Abdominal: Soft. Bowel sounds are normal. There is no hepatosplenomegaly. There is tenderness in the right upper quadrant, epigastric area and left upper quadrant. There is no rigidity, no rebound, no guarding, no CVA tenderness, no tenderness at McBurney's point and negative Murphy's  sign.  Neurological: He is alert and oriented to person, place, and time.  Skin: Skin is warm and dry. No rash noted. No erythema. No pallor.  Psychiatric: He has a normal mood and affect. His behavior is normal.    ED Course  Procedures  Results for orders placed during the hospital encounter of 06/01/11  CBC      Component Value Range    WBC 9.1  4.0 - 10.5 (K/uL)   RBC 5.45  4.22 - 5.81 (MIL/uL)   Hemoglobin 16.9  13.0 - 17.0 (g/dL)   HCT 65.7  84.6 - 96.2 (%)   MCV 84.8  78.0 - 100.0 (fL)   MCH 31.0  26.0 - 34.0 (pg)   MCHC 36.6 (*) 30.0 - 36.0 (g/dL)   RDW 95.2  84.1 - 32.4 (%)   Platelets 243  150 - 400 (K/uL)  DIFFERENTIAL      Component Value Range   Neutrophils Relative 47  43 - 77 (%)   Neutro Abs 4.2  1.7 - 7.7 (K/uL)   Lymphocytes Relative 40  12 - 46 (%)   Lymphs Abs 3.6  0.7 - 4.0 (K/uL)   Monocytes Relative 11  3 - 12 (%)   Monocytes Absolute 1.0  0.1 - 1.0 (K/uL)   Eosinophils Relative 3  0 - 5 (%)   Eosinophils Absolute 0.3  0.0 - 0.7 (K/uL)   Basophils Relative 1  0 - 1 (%)   Basophils Absolute 0.1  0.0 - 0.1 (K/uL)  COMPREHENSIVE METABOLIC PANEL      Component Value Range   Sodium 137  135 - 145 (mEq/L)   Potassium 3.3 (*) 3.5 - 5.1 (mEq/L)   Chloride 103  96 - 112 (mEq/L)   CO2 23  19 - 32 (mEq/L)   Glucose, Bld 106 (*) 70 - 99 (mg/dL)   BUN 10  6 - 23 (mg/dL)   Creatinine, Ser 4.01  0.50 - 1.35 (mg/dL)   Calcium 9.8  8.4 - 02.7 (mg/dL)   Total Protein 7.6  6.0 - 8.3 (g/dL)   Albumin 4.3  3.5 - 5.2 (g/dL)   AST 22  0 - 37 (U/L)   ALT 22  0 - 53 (U/L)   Alkaline Phosphatase 61  39 - 117 (U/L)   Total Bilirubin 0.5  0.3 - 1.2 (mg/dL)   GFR calc non Af Amer >90  >90 (mL/min)   GFR calc Af Amer >90  >90 (mL/min)  LIPASE, BLOOD      Component Value Range   Lipase 137 (*) 11 - 59 (U/L)  URINALYSIS, ROUTINE W REFLEX MICROSCOPIC      Component Value Range   Color, Urine YELLOW  YELLOW    APPearance CLEAR  CLEAR    Specific Gravity, Urine 1.021  1.005 - 1.030    pH 5.5  5.0 - 8.0    Glucose, UA NEGATIVE  NEGATIVE (mg/dL)   Hgb urine dipstick NEGATIVE  NEGATIVE    Bilirubin Urine NEGATIVE  NEGATIVE    Ketones, ur NEGATIVE  NEGATIVE (mg/dL)   Protein, ur NEGATIVE  NEGATIVE (mg/dL)   Urobilinogen, UA 0.2  0.0 - 1.0 (mg/dL)   Nitrite NEGATIVE  NEGATIVE    Leukocytes, UA NEGATIVE  NEGATIVE         No diagnosis found.    MDM    7:26 AM Patient with Pancreatitis. Reports pain improved somewhat. Will give another dose of analgesics and reassess.  8:36 AM  Patient reports pain  is manageable now. Reports completely improved nausea. Ready for discharge. Patient reports followup appointment with Dr. Randa Evens are rescheduled for Monday. Will give enough analgesics and antiemetics on Monday. patient agrees with plan and is ready for discharge    Thomasene Lot, [place] 06/01/11 [phone]

## 2011-06-02 NOTE — ED Provider Notes (Signed)
Medical screening examination/treatment/procedure(s) were performed by non-physician practitioner and as supervising physician I was immediately available for consultation/collaboration.  Leniyah Martell K Jaymond Waage-Rasch, MD 06/02/11 0023 

## 2011-07-03 ENCOUNTER — Encounter (HOSPITAL_COMMUNITY): Payer: Self-pay | Admitting: *Deleted

## 2011-07-03 ENCOUNTER — Emergency Department (HOSPITAL_COMMUNITY)
Admission: EM | Admit: 2011-07-03 | Discharge: 2011-07-03 | Disposition: A | Discharge status: Home / Self Care | Payer: BC Managed Care – PPO | Attending: Emergency Medicine | Admitting: Emergency Medicine

## 2011-07-03 DIAGNOSIS — F17200 Nicotine dependence, unspecified, uncomplicated: Secondary | ICD-10-CM | POA: Insufficient documentation

## 2011-07-03 DIAGNOSIS — R112 Nausea with vomiting, unspecified: Secondary | ICD-10-CM | POA: Insufficient documentation

## 2011-07-03 DIAGNOSIS — Z79899 Other long term (current) drug therapy: Secondary | ICD-10-CM | POA: Insufficient documentation

## 2011-07-03 DIAGNOSIS — R109 Unspecified abdominal pain: Secondary | ICD-10-CM | POA: Insufficient documentation

## 2011-07-03 DIAGNOSIS — K859 Acute pancreatitis without necrosis or infection, unspecified: Secondary | ICD-10-CM | POA: Insufficient documentation

## 2011-07-03 DIAGNOSIS — R10816 Epigastric abdominal tenderness: Secondary | ICD-10-CM | POA: Insufficient documentation

## 2011-07-03 LAB — CBC
HCT: 45.6 % (ref 39.0–52.0)
Hemoglobin: 16.2 g/dL (ref 13.0–17.0)
MCH: 30.8 pg (ref 26.0–34.0)
Platelets: 188 10*3/uL (ref 150–400)
RDW: 12.4 % (ref 11.5–15.5)

## 2011-07-03 LAB — COMPREHENSIVE METABOLIC PANEL
ALT: 21 U/L (ref 0–53)
AST: 21 U/L (ref 0–37)
Albumin: 4 g/dL (ref 3.5–5.2)
Alkaline Phosphatase: 57 U/L (ref 39–117)
Calcium: 9.9 mg/dL (ref 8.4–10.5)
Glucose, Bld: 134 mg/dL — ABNORMAL HIGH (ref 70–99)
Potassium: 3 mEq/L — ABNORMAL LOW (ref 3.5–5.1)
Sodium: 140 mEq/L (ref 135–145)
Total Protein: 7.1 g/dL (ref 6.0–8.3)

## 2011-07-03 LAB — DIFFERENTIAL
Basophils Absolute: 0 10*3/uL (ref 0.0–0.1)
Basophils Relative: 0 % (ref 0–1)
Eosinophils Absolute: 0.2 10*3/uL (ref 0.0–0.7)
Lymphs Abs: 2.5 10*3/uL (ref 0.7–4.0)
Neutrophils Relative %: 58 % (ref 43–77)

## 2011-07-03 MED ORDER — PANTOPRAZOLE SODIUM 40 MG IV SOLR
40.0000 mg | Freq: Once | INTRAVENOUS | Status: AC
  Administered 2011-07-03: 40 mg via INTRAVENOUS
  Filled 2011-07-03: qty 40

## 2011-07-03 MED ORDER — SODIUM CHLORIDE 0.9 % IV BOLUS (SEPSIS)
1000.0000 mL | Freq: Once | INTRAVENOUS | Status: AC
  Administered 2011-07-03: 1000 mL via INTRAVENOUS

## 2011-07-03 MED ORDER — ONDANSETRON HCL 4 MG/2ML IJ SOLN
4.0000 mg | Freq: Once | INTRAMUSCULAR | Status: AC
  Administered 2011-07-03: 4 mg via INTRAVENOUS
  Filled 2011-07-03: qty 2

## 2011-07-03 MED ORDER — POTASSIUM CHLORIDE 20 MEQ/15ML (10%) PO LIQD
40.0000 meq | Freq: Once | ORAL | Status: AC
  Administered 2011-07-03: 40 meq via ORAL
  Filled 2011-07-03: qty 30

## 2011-07-03 MED ORDER — OXYCODONE-ACETAMINOPHEN 5-325 MG PO TABS: 1.0000 | ORAL_TABLET | Freq: Four times a day (QID) | ORAL | Status: AC | PRN

## 2011-07-03 MED ORDER — HYDROMORPHONE HCL PF 1 MG/ML IJ SOLN
1.0000 mg | Freq: Once | INTRAMUSCULAR | Status: AC
  Administered 2011-07-03: 1 mg via INTRAVENOUS
  Filled 2011-07-03: qty 1

## 2011-07-03 MED ORDER — PROMETHAZINE HCL 25 MG PO TABS: 25.0000 mg | ORAL_TABLET | Freq: Four times a day (QID) | ORAL | Status: DC | PRN

## 2011-07-03 MED ORDER — PROMETHAZINE HCL 25 MG/ML IJ SOLN
25.0000 mg | Freq: Once | INTRAMUSCULAR | Status: AC
  Administered 2011-07-03: 25 mg via INTRAVENOUS
  Filled 2011-07-03: qty 1

## 2011-07-03 NOTE — ED Provider Notes (Signed)
Medical screening examination/treatment/procedure(s) were performed by non-physician practitioner and as supervising physician I was immediately available for consultation/collaboration.  Micalah Cabezas R. Skie Vitrano, MD 07/03/11 2252 

## 2011-07-03 NOTE — ED Provider Notes (Signed)
History     CSN: [id number]  Arrival date & time 07/03/11  8295   First MD Initiated Contact with Patient 07/03/11 [phone]      Chief Complaint  Patient presents with  . Abdominal Pain  . Emesis    (Consider location/radiation/quality/duration/timing/severity/associated sxs/prior treatment) HPI Patient presented to the emergency department this morning with complaints of epigastric abdominal pain. The patient has a long history of having repeat episodes of pancreatitis do to an ERPC that was done and complications developed from that. The patient sees Dr. Randa Evens for his gastroenterologist, and whenever his amylase lipase protease medication is changed the patient typically develops pancreatic exacerbation. States that this feels like his normal pancreatic pain and currently the pain is a 9/10. The patient admits to nausea and vomiting but denies lower quadrant pain, diarrhea, fevers, chills, urinary symptoms, rectal or urinary bleeding   Past Medical History  Diagnosis Date  . Pancreatitis   . Pancreatitis   . Kidney stones     Past Surgical History  Procedure Date  . Appendectomy   . Cholecystectomy     No family history on file.  History  Substance Use Topics  . Smoking status: Current Everyday Smoker  . Smokeless tobacco: Not on file  . Alcohol Use: No      Review of Systems  All other systems reviewed and are negative.    Allergies  Cortisone and Toradol  Home Medications   Current Outpatient Rx  Name Route Sig Dispense Refill  . AMYLASE-LIPASE-PROTEASE 05-01-11 MU PO CPEP Oral Take 1 capsule by mouth 2 (two) times daily with breakfast and lunch.     Marland Kitchen PANTOPRAZOLE SODIUM 20 MG PO TBEC Oral Take 1 tablet (20 mg total) by mouth daily. 30 tablet 0  . POTASSIUM CHLORIDE CRYS ER 20 MEQ PO TBCR Oral Take 1 tablet (20 mEq total) by mouth 2 (two) times daily. 10 tablet 0    BP 144/99  Pulse 76  Temp(Src) 98 F (36.7 C) (Oral)  Resp 18  SpO2 98%  Physical  Exam  Nursing note and vitals reviewed. Constitutional: He is oriented to person, place, and time. He appears well-developed and well-nourished. No distress (pt appears to be uncomfortable but is not in distress.).  HENT:  Head: Normocephalic and atraumatic.  Eyes: EOM are normal. Pupils are equal, round, and reactive to light.  Neck: Normal range of motion.  Cardiovascular: Normal rate and regular rhythm.   Pulmonary/Chest: Effort normal and breath sounds normal.  Abdominal: Soft. Bowel sounds are normal. He exhibits no distension and no mass. There is tenderness (tenderness to the epigastrum). There is no rebound and no guarding.  Musculoskeletal: Normal range of motion.  Neurological: He is alert and oriented to person, place, and time.  Skin: Skin is warm and dry.    ED Course  Procedures (including critical care time)  Labs Reviewed  COMPREHENSIVE METABOLIC PANEL - Abnormal; Notable for the following:    Potassium 3.0 (*)    Glucose, Bld 134 (*)    All other components within normal limits  LIPASE, BLOOD - Abnormal; Notable for the following:    Lipase 79 (*)    All other components within normal limits  CBC  DIFFERENTIAL   No results found.   1. Pancreatitis       MDM  Patient's pain has been well managed in the ED with repeat doses of Dilaudid-HP and Zofran. The patient able to tolerate PO fluids in ED. The patient's lipase  is 74. The patient has a appointment with Dr. Randa Evens his gastroenterologist coming up this Friday, however he states he will call Dr. Randa Evens today and see if he wanted to have that appointment moved up. The patient will be discharged with oral Percocet and Phenergan.         Dorthula Matas, PA 07/03/11 1012

## 2011-07-03 NOTE — ED Notes (Signed)
Pt has hx of pancreatitis from ERCP where they placed a pancreatic stent and it moved.  He is here with mid upper abd pain and vomiting.

## 2011-07-29 ENCOUNTER — Emergency Department (HOSPITAL_COMMUNITY)
Admission: EM | Admit: 2011-07-29 | Discharge: 2011-07-29 | Disposition: A | Discharge status: Home / Self Care | Payer: BC Managed Care – PPO | Attending: Emergency Medicine | Admitting: Emergency Medicine

## 2011-07-29 ENCOUNTER — Encounter (HOSPITAL_COMMUNITY): Payer: Self-pay | Admitting: Emergency Medicine

## 2011-07-29 DIAGNOSIS — R109 Unspecified abdominal pain: Secondary | ICD-10-CM | POA: Insufficient documentation

## 2011-07-29 DIAGNOSIS — R110 Nausea: Secondary | ICD-10-CM | POA: Insufficient documentation

## 2011-07-29 DIAGNOSIS — K859 Acute pancreatitis without necrosis or infection, unspecified: Secondary | ICD-10-CM | POA: Insufficient documentation

## 2011-07-29 DIAGNOSIS — Z79899 Other long term (current) drug therapy: Secondary | ICD-10-CM | POA: Insufficient documentation

## 2011-07-29 LAB — COMPREHENSIVE METABOLIC PANEL WITH GFR
ALT: 22 U/L (ref 0–53)
AST: 22 U/L (ref 0–37)
Albumin: 4 g/dL (ref 3.5–5.2)
Alkaline Phosphatase: 55 U/L (ref 39–117)
BUN: 10 mg/dL (ref 6–23)
CO2: 25 meq/L (ref 19–32)
Calcium: 9.7 mg/dL (ref 8.4–10.5)
Chloride: 103 meq/L (ref 96–112)
Creatinine, Ser: 0.8 mg/dL (ref 0.50–1.35)
GFR calc Af Amer: >90 mL/min
GFR calc non Af Amer: >90 mL/min
Glucose, Bld: 115 mg/dL — ABNORMAL HIGH (ref 70–99)
Potassium: 3.4 meq/L — ABNORMAL LOW (ref 3.5–5.1)
Sodium: 140 meq/L (ref 135–145)
Total Bilirubin: 0.5 mg/dL (ref 0.3–1.2)
Total Protein: 7 g/dL (ref 6.0–8.3)

## 2011-07-29 LAB — LIPASE, BLOOD: Lipase: 89 U/L — ABNORMAL HIGH (ref 11–59)

## 2011-07-29 LAB — CBC
HCT: 44.2 % (ref 39.0–52.0)
Hemoglobin: 16.2 g/dL (ref 13.0–17.0)
MCV: 85.8 fL (ref 78.0–100.0)
RBC: 5.15 MIL/uL (ref 4.22–5.81)
WBC: 9.8 10*3/uL (ref 4.0–10.5)

## 2011-07-29 LAB — DIFFERENTIAL
Eosinophils Relative: 3 % (ref 0–5)
Lymphocytes Relative: 36 % (ref 12–46)
Lymphs Abs: 3.5 10*3/uL (ref 0.7–4.0)
Monocytes Absolute: 1 10*3/uL (ref 0.1–1.0)
Monocytes Relative: 10 % (ref 3–12)

## 2011-07-29 MED ORDER — PROMETHAZINE HCL 25 MG PO TABS: 25.0000 mg | ORAL_TABLET | Freq: Four times a day (QID) | ORAL | Status: DC | PRN

## 2011-07-29 MED ORDER — HYDROMORPHONE HCL PF 1 MG/ML IJ SOLN
1.0000 mg | Freq: Once | INTRAMUSCULAR | Status: AC
  Administered 2011-07-29: 1 mg via INTRAVENOUS
  Filled 2011-07-29: qty 1

## 2011-07-29 MED ORDER — SODIUM CHLORIDE 0.9 % IV SOLN
Freq: Once | INTRAVENOUS | Status: AC
  Administered 2011-07-29: 08:00:00 via INTRAVENOUS

## 2011-07-29 MED ORDER — OXYCODONE-ACETAMINOPHEN 5-325 MG PO TABS: 1.0000 | ORAL_TABLET | ORAL | Status: AC | PRN

## 2011-07-29 MED ORDER — METOCLOPRAMIDE HCL 5 MG/ML IJ SOLN
10.0000 mg | Freq: Once | INTRAMUSCULAR | Status: AC
  Administered 2011-07-29: 10 mg via INTRAVENOUS
  Filled 2011-07-29: qty 2

## 2011-07-29 MED ORDER — DIPHENHYDRAMINE HCL 50 MG/ML IJ SOLN
25.0000 mg | Freq: Once | INTRAMUSCULAR | Status: AC
  Administered 2011-07-29: 25 mg via INTRAVENOUS
  Filled 2011-07-29: qty 1

## 2011-07-29 MED ORDER — ONDANSETRON HCL 4 MG/2ML IJ SOLN
4.0000 mg | Freq: Once | INTRAMUSCULAR | Status: AC
  Administered 2011-07-29: 4 mg via INTRAVENOUS
  Filled 2011-07-29: qty 2

## 2011-07-29 NOTE — ED Provider Notes (Signed)
History     CSN: [id number]  Arrival date & time 07/29/11  0865   First MD Initiated Contact with Patient 07/29/11 0750      Chief Complaint  Patient presents with  . Abdominal Pain  . Nausea    (Consider location/radiation/quality/duration/timing/severity/associated sxs/prior treatment) Patient is a 36 y.o. male presenting with abdominal pain. The history is provided by the patient.  Abdominal Pain The primary symptoms of the illness include abdominal pain.  He has a history of pancreatitis which usually flares up whenever his medications are changed. He had his medication changed 2 weeks ago. He was doing well until 4 AM today when he was awakened with severe epigastric and right upper quadrant pain which radiates through to the back. Pain is sharp and burning and he rates it at 8/10. Nothing makes it better nothing makes it worse. There is associated nausea but no vomiting. He denies fever, chills, sweats. Pain is typical of his pancreatitis. He denies any alcohol ingestion. He relates that his pancreatitis is due to a stent which had been placed following biliary surgery which had migrated into the pancreas.  Past Medical History  Diagnosis Date  . Pancreatitis   . Pancreatitis   . Kidney stones     Past Surgical History  Procedure Date  . Appendectomy   . Cholecystectomy     History reviewed. No pertinent family history.  History  Substance Use Topics  . Smoking status: Current Everyday Smoker  . Smokeless tobacco: Not on file  . Alcohol Use: No      Review of Systems  Gastrointestinal: Positive for abdominal pain.  All other systems reviewed and are negative.    Allergies  Cortisone and Toradol  Home Medications   Current Outpatient Rx  Name Route Sig Dispense Refill  . AMYLASE-LIPASE-PROTEASE 05-01-11 MU PO CPEP Oral Take 1 capsule by mouth 2 (two) times daily with breakfast and lunch.     Marland Kitchen PANTOPRAZOLE SODIUM 20 MG PO TBEC Oral Take 1 tablet (20 mg  total) by mouth daily. 30 tablet 0  . POTASSIUM CHLORIDE CRYS ER 20 MEQ PO TBCR Oral Take 1 tablet (20 mEq total) by mouth 2 (two) times daily. 10 tablet 0    BP 147/113  Pulse 98  Temp(Src) 98.4 F (36.9 C) (Oral)  Resp 20  SpO2 99%  Physical Exam  Nursing note and vitals reviewed.  36 year old male who is obviously in pain. Vital signs are significant for hypertension with blood pressure 147/113. Oxygen saturation is 99% which is normal. Head is normocephalic and atraumatic. PERRLA, EOMI. There is no scleral icterus. Oropharynx is clear. Neck is nontender and supple without adenopathy. Back is nontender and there is no CVA tenderness. Lungs are clear without rales, wheezes, rhonchi. Heart has regular rate and rhythm without murmur. Abdomen is soft with moderate to severe tenderness in the epigastrium and right upper quadrant without any rebound or guarding. Peristalsis is diminished. Extremities have no cyanosis or edema, full range of motion is present. Skin is and dry without rash. Neurologic: Mental status is normal, cranial nerves are intact, there no focal motor or sensory deficits.  ED Course  Procedures (including critical care time)  He was given IV fluids and IV Dilaudid-HP with Zofran with slight relief of pain. He was given additional Dilaudid and IV Reglan with excellent relief of pain. He will be sent home with prescription for Percocet and Phenergan. He states that oral Phenergan gives him better control of nausea than  oral Reglan.  1. Pancreatitis       MDM  Recurrent episode of pancreatitis. Old charts are reviewed and he has several ED visits for pancreatitis and is usually able to be managed with IV fluids and pain medicine and sent home.        Dione Booze, MD 07/29/11 1230

## 2011-07-29 NOTE — ED Notes (Signed)
Pt reports woke up with right upper quadrant abdominal pain onset 0400. Pt also reports nausea.

## 2011-08-22 ENCOUNTER — Encounter (HOSPITAL_COMMUNITY): Payer: Self-pay | Admitting: Emergency Medicine

## 2011-08-22 ENCOUNTER — Emergency Department (HOSPITAL_COMMUNITY): Payer: BC Managed Care – PPO

## 2011-08-22 ENCOUNTER — Emergency Department (HOSPITAL_COMMUNITY)
Admission: EM | Admit: 2011-08-22 | Discharge: 2011-08-22 | Disposition: A | Discharge status: Home / Self Care | Payer: BC Managed Care – PPO | Attending: Emergency Medicine | Admitting: Emergency Medicine

## 2011-08-22 DIAGNOSIS — R109 Unspecified abdominal pain: Secondary | ICD-10-CM | POA: Insufficient documentation

## 2011-08-22 DIAGNOSIS — Z79899 Other long term (current) drug therapy: Secondary | ICD-10-CM | POA: Insufficient documentation

## 2011-08-22 DIAGNOSIS — R112 Nausea with vomiting, unspecified: Secondary | ICD-10-CM | POA: Insufficient documentation

## 2011-08-22 DIAGNOSIS — R110 Nausea: Secondary | ICD-10-CM

## 2011-08-22 LAB — DIFFERENTIAL
Basophils Absolute: 0 10*3/uL (ref 0.0–0.1)
Basophils Relative: 0 % (ref 0–1)
Eosinophils Absolute: 0.3 10*3/uL (ref 0.0–0.7)
Eosinophils Relative: 2 % (ref 0–5)
Lymphocytes Relative: 36 % (ref 12–46)
Lymphs Abs: 4.1 10*3/uL — ABNORMAL HIGH (ref 0.7–4.0)
Monocytes Absolute: 1 K/uL (ref 0.1–1.0)
Monocytes Relative: 8 % (ref 3–12)
Neutro Abs: 6.2 K/uL (ref 1.7–7.7)
Neutrophils Relative %: 54 % (ref 43–77)

## 2011-08-22 LAB — CBC
HCT: 47.3 % (ref 39.0–52.0)
Hemoglobin: 17 g/dL (ref 13.0–17.0)
MCH: 31.4 pg (ref 26.0–34.0)
MCHC: 35.9 g/dL (ref 30.0–36.0)
MCV: 87.3 fL (ref 78.0–100.0)
Platelets: 258 10*3/uL (ref 150–400)
RBC: 5.42 MIL/uL (ref 4.22–5.81)
RDW: 12.8 % (ref 11.5–15.5)
WBC: 11.6 K/uL — ABNORMAL HIGH (ref 4.0–10.5)

## 2011-08-22 LAB — COMPREHENSIVE METABOLIC PANEL WITH GFR
BUN: 9 mg/dL (ref 6–23)
CO2: 25 meq/L (ref 19–32)
Calcium: 9.8 mg/dL (ref 8.4–10.5)
Chloride: 104 meq/L (ref 96–112)
Creatinine, Ser: 0.79 mg/dL (ref 0.50–1.35)
GFR calc Af Amer: >90 mL/min (ref >90)
GFR calc non Af Amer: >90 mL/min (ref >90)
Glucose, Bld: 98 mg/dL (ref 70–99)
Total Bilirubin: 0.3 mg/dL (ref 0.3–1.2)

## 2011-08-22 LAB — COMPREHENSIVE METABOLIC PANEL
ALT: 23 U/L (ref 0–53)
AST: 26 U/L (ref 0–37)
Albumin: 4.4 g/dL (ref 3.5–5.2)
Alkaline Phosphatase: 75 U/L (ref 39–117)
Potassium: 3.4 mEq/L — ABNORMAL LOW (ref 3.5–5.1)
Sodium: 141 mEq/L (ref 135–145)
Total Protein: 7.5 g/dL (ref 6.0–8.3)

## 2011-08-22 LAB — LIPASE, BLOOD: Lipase: 49 U/L (ref 11–59)

## 2011-08-22 MED ORDER — ONDANSETRON HCL 4 MG/2ML IJ SOLN
4.0000 mg | Freq: Once | INTRAMUSCULAR | Status: AC
  Administered 2011-08-22: 4 mg via INTRAVENOUS
  Filled 2011-08-22: qty 2

## 2011-08-22 MED ORDER — PANTOPRAZOLE SODIUM 40 MG IV SOLR
40.0000 mg | Freq: Once | INTRAVENOUS | Status: AC
  Administered 2011-08-22: 40 mg via INTRAVENOUS
  Filled 2011-08-22: qty 40

## 2011-08-22 MED ORDER — HYDROCODONE-ACETAMINOPHEN 5-325 MG PO TABS: ORAL_TABLET | ORAL | Status: AC

## 2011-08-22 MED ORDER — HYDROMORPHONE HCL PF 1 MG/ML IJ SOLN
1.0000 mg | Freq: Once | INTRAMUSCULAR | Status: AC
  Administered 2011-08-22: 1 mg via INTRAVENOUS
  Filled 2011-08-22: qty 1

## 2011-08-22 MED ORDER — ONDANSETRON 8 MG PO TBDP: 8.0000 mg | ORAL_TABLET | Freq: Two times a day (BID) | ORAL | Status: AC | PRN

## 2011-08-22 MED ORDER — PANTOPRAZOLE SODIUM 40 MG PO TBEC: 40.0000 mg | DELAYED_RELEASE_TABLET | Freq: Every day | ORAL | Status: DC

## 2011-08-22 MED ORDER — SODIUM CHLORIDE 0.9 % IV BOLUS (SEPSIS)
1000.0000 mL | Freq: Once | INTRAVENOUS | Status: AC
  Administered 2011-08-22: 1000 mL via INTRAVENOUS

## 2011-08-22 NOTE — ED Notes (Signed)
PT continues to c/o pain and nausea.  Dr Oletta Lamas to be notified.

## 2011-08-22 NOTE — ED Provider Notes (Signed)
History     CSN: [id number]  Arrival date & time 08/22/11  0139   First MD Initiated Contact with [name] 08/22/11 [phone]      Chief Complaint  [name] presents with  . Abdominal Pain    (Consider location/radiation/quality/duration/timing/severity/associated sxs/prior treatment) HPI Comments: [name] with a long history of pancreatitis having had a stent in his pancreatic ducts any years ago and then removed and do to complications. This occurred approximately 4 years ago in [place]. He currently follows Dr. Randa Evens with gastroenterology. He reports yesterday he was in his usual state of health. He reports last night at about 5 PM he ate some fried chicken which he knows certain foods often trigger a pancreatic flareup. He denies that he drinks alcohol. He denies any change in his usual medications. He reports that he has had his appendix and gallbladder already removed. He denies fevers or chills. He reports pain is mostly in his right upper quadrant and upper epigastric region. He reports his pain is very similar to prior pancreatitis flares. He denies any urinary symptoms, blood in stool, diarrhea. He has had several episodes of nausea and vomiting as well.  [name] is a 36 y.o. male presenting with abdominal pain. The history is provided by the [name].  Abdominal Pain The primary symptoms of the illness include abdominal pain, nausea and vomiting. The primary symptoms of the illness do not include shortness of breath, diarrhea or dysuria.  Symptoms associated with the illness do not include constipation or back pain.    Past Medical History  Diagnosis Date  . Pancreatitis   . Pancreatitis   . Kidney stones     Past Surgical History  Procedure Date  . Appendectomy   . Cholecystectomy     History reviewed. No pertinent family history.  History  Substance Use Topics  . Smoking status: Current Everyday Smoker  . Smokeless tobacco: Not on file  . Alcohol Use: No       Review of Systems  Constitutional: Negative.   Respiratory: Negative for cough, chest tightness and shortness of breath.   Cardiovascular: Negative for chest pain.  Gastrointestinal: Positive for nausea, vomiting and abdominal pain. Negative for diarrhea, constipation, blood in stool and abdominal distention.  Genitourinary: Negative for dysuria and flank pain.  Musculoskeletal: Negative for back pain.  All other systems reviewed and are negative.    Allergies  Cortisone; Eggs or egg-derived products; and Toradol  Home Medications   Current Outpatient Rx  Name Route Sig Dispense Refill  . AMYLASE-LIPASE-PROTEASE 05-01-11 MU PO CPEP Oral Take 1 capsule by mouth 2 (two) times daily with breakfast and lunch.     Marland Kitchen BISMUTH SUBSALICYLATE 262 MG PO CHEW Oral Chew 262 mg by mouth daily as needed. For stomach acid    . IBUPROFEN 200 MG PO TABS Oral Take 400 mg by mouth every 6 (six) hours as needed. For pain    . ONDANSETRON HCL 4 MG PO TABS Oral Take 4 mg by mouth every 8 (eight) hours as needed. nausea    . HYDROCODONE-ACETAMINOPHEN 5-325 MG PO TABS  1-2 tablets po q 6 hours prn moderate to severe pain 20 tablet 0  . ONDANSETRON 8 MG PO TBDP Oral Take 1 tablet (8 mg total) by mouth every 12 (twelve) hours as needed for nausea. 20 tablet 0  . PANTOPRAZOLE SODIUM 40 MG PO TBEC Oral Take 1 tablet (40 mg total) by mouth daily. 14 tablet 0    BP 147/104  Pulse  63  Temp(Src) 98.1 F (36.7 C) (Oral)  Resp 22  SpO2 99%  Physical Exam  Nursing note and vitals reviewed. Constitutional: He is oriented to person, place, and time. He appears well-developed and well-nourished.  HENT:  Head: Normocephalic and atraumatic.  Eyes: Pupils are equal, round, and reactive to light.  Neck: Neck supple.  Cardiovascular: Normal rate and regular rhythm.   Pulmonary/Chest: Effort normal and breath sounds normal.  Abdominal: Soft. Normal appearance and bowel sounds are normal. He exhibits no  distension and no mass. There is no hepatosplenomegaly. There is tenderness in the right upper quadrant and epigastric area. There is guarding. There is no rebound and no CVA tenderness.  Neurological: He is alert and oriented to person, place, and time.  Skin: Skin is warm and dry.    ED Course  Procedures (including critical care time)  Labs Reviewed  CBC - Abnormal; Notable for the following:    WBC 11.6 (*)    All other components within normal limits  DIFFERENTIAL - Abnormal; Notable for the following:    Lymphs Abs 4.1 (*)    All other components within normal limits  COMPREHENSIVE METABOLIC PANEL - Abnormal; Notable for the following:    Potassium 3.4 (*)    All other components within normal limits  LIPASE, BLOOD   Dg Abd Acute W/chest  08/22/2011  *RADIOLOGY REPORT*  Clinical Data: Mid abdominal pain and vomiting.  ACUTE ABDOMEN SERIES (ABDOMEN 2 VIEW & CHEST 1 VIEW)  Comparison: Chest and abdominal radiographs performed 08/14/2010, and abdominal radiograph performed 01/21/2011  Findings: The lungs are well-aerated.  Minimal right midlung opacity likely reflects atelectasis.  There is no evidence of pleural effusion or pneumothorax.  The cardiomediastinal silhouette is borderline normal in size.  The visualized bowel gas pattern is unremarkable.  Scattered stool and air are seen within the colon; there is no evidence of small bowel dilatation to suggest obstruction.  No free intra-abdominal air is identified on the provided upright view.  No acute osseous abnormalities are seen; the sacroiliac joints are unremarkable in appearance.  Clips are noted within the right upper quadrant, reflecting prior cholecystectomy.  IMPRESSION:  1.  Unremarkable bowel gas pattern; no free intra-abdominal air seen. 2.  Minimal right midlung opacity likely reflects atelectasis; lungs otherwise clear.  Original Report Authenticated By: Tonia Ghent, M.D.     1. Abdominal pain   2. Nausea     6:16  AM After first dose of IV analgesics and antiemetics, the [name] reports the pain is somewhat improved he reports that the medications are taken the edge of symptoms. I reviewed his lab tests with the [name] as well as plain film results. My plan is to administer one more dose of analgesics and antiemetics and reassess the [name]. If he does indeed feel improved, is able to tolerate by mouth as I feel [name] is stable for discharge. Abdomen is soft, he has no rebound tenderness.  7:23 AM [name] reports feeling much improved, abdomen is soft with no guarding or rebound. [name] reports he can followup with Dr. Randa Evens and will give his office a call later today.  MDM   [name]'s white count is minimally elevated at 11. [name]'s symptoms are consistent with his prior episodes of pancreatitis. He reports he has not drunk any alcohol. His lipase here is within normal limits. My plan is to hydrate, give IV analgesics and antiemetics for symptom relief. He does have some tenderness, however he does not have significant  abdominal distention and no rebound. The [name] already has had his gallbladder removed therefore I doubt that there is any concern for an acute surgical abdominal process. Plain films which I also reviewed myself, per Dr. Cherly Hensen, radiologist, there is no evidence of bowel obstruction which had very little clinical suspicion for. If [name]'s symptoms are improved and he is able to tolerate by mouth signs is that he can be discharged home.        Gavin Pound. Oletta Lamas, MD 08/22/11 [phone]

## 2011-08-22 NOTE — ED Notes (Signed)
PT. REPORTS RUQ PAIN WITH VOMITTING ONSET HIS EVENING , DENIES FEVER OR DIARRHEA.

## 2011-08-22 NOTE — Discharge Instructions (Signed)
Abdominal Pain  Abdominal pain can be caused by many things. Your caregiver decides the seriousness of your pain by an examination and possibly blood tests and X-rays. Many cases can be observed and treated at home. Most abdominal pain is not caused by a disease and will probably improve without treatment. However, in many cases, more time must pass before a clear cause of the pain can be found. Before that point, it may not be known if you need more testing, or if hospitalization or surgery is needed.  HOME CARE INSTRUCTIONS    Do not take laxatives unless directed by your caregiver.   Take pain medicine only as directed by your caregiver.   Only take over-the-counter or prescription medicines for pain, discomfort, or fever as directed by your caregiver.   Try a clear liquid diet (broth, tea, or water) for as long as directed by your caregiver. Slowly move to a bland diet as tolerated.  SEEK IMMEDIATE MEDICAL CARE IF:    The pain does not go away.   You have a fever.   You keep throwing up (vomiting).   The pain is felt only in portions of the abdomen. Pain in the right side could possibly be appendicitis. In an adult, pain in the left lower portion of the abdomen could be colitis or diverticulitis.   You pass bloody or black tarry stools.  MAKE SURE YOU:    Understand these instructions.   Will watch your condition.   Will get help right away if you are not doing well or get worse.  Document Released: 02/21/2005 Document Revised: 05/03/2011 Document Reviewed: 12/31/2007  [employer] Patient Information 2012 [employer]    Nausea and Vomiting  Nausea is a sick feeling that often comes before throwing up (vomiting). Vomiting is a reflex where stomach contents come out of your mouth. Vomiting can cause severe loss of body fluids (dehydration). Children and elderly adults can become dehydrated quickly, especially if they also have diarrhea. Nausea and vomiting are symptoms of a condition or disease. It  is important to find the cause of your symptoms.  CAUSES    Direct irritation of the stomach lining. This irritation can result from increased acid production (gastroesophageal reflux disease), infection, food poisoning, taking certain medicines (such as nonsteroidal anti-inflammatory drugs), alcohol use, or tobacco use.   Signals from the brain.These signals could be caused by a headache, heat exposure, an inner ear disturbance, increased pressure in the brain from injury, infection, a tumor, or a concussion, pain, emotional stimulus, or metabolic problems.   An obstruction in the gastrointestinal tract (bowel obstruction).   Illnesses such as diabetes, hepatitis, gallbladder problems, appendicitis, kidney problems, cancer, sepsis, atypical symptoms of a heart attack, or eating disorders.   Medical treatments such as chemotherapy and radiation.   Receiving medicine that makes you sleep (general anesthetic) during surgery.  DIAGNOSIS  Your caregiver may ask for tests to be done if the problems do not improve after a few days. Tests may also be done if symptoms are severe or if the reason for the nausea and vomiting is not clear. Tests may include:   Urine tests.   Blood tests.   Stool tests.   Cultures (to look for evidence of infection).   X-rays or other imaging studies.  Test results can help your caregiver make decisions about treatment or the need for additional tests.  TREATMENT  You need to stay well hydrated. Drink frequently but in small amounts.You may wish to   dessert to help stay hydrated.When you eat, eating slowly may help prevent nausea.There are also some antinausea medicines that may help prevent nausea. HOME CARE INSTRUCTIONS   Take all medicine as directed by your caregiver.   If you do not have an appetite, do not force yourself to eat. However, you must continue to drink  fluids.   If you have an appetite, eat a normal diet unless your caregiver tells you differently.   Eat a variety of complex carbohydrates (rice, wheat, potatoes, bread), lean meats, yogurt, fruits, and vegetables.   Avoid high-fat foods because they are more difficult to digest.   Drink enough water and fluids to keep your urine clear or pale yellow.   If you are dehydrated, ask your caregiver for specific rehydration instructions. Signs of dehydration may include:   Severe thirst.   Dry lips and mouth.   Dizziness.   Dark urine.   Decreasing urine frequency and amount.   Confusion.   Rapid breathing or pulse.  SEEK IMMEDIATE MEDICAL CARE IF:   You have blood or brown flecks (like coffee grounds) in your vomit.   You have black or bloody stools.   You have a severe headache or stiff neck.   You are confused.   You have severe abdominal pain.   You have chest pain or trouble breathing.   You do not urinate at least once every 8 hours.   You develop cold or clammy skin.   You continue to vomit for longer than 24 to 48 hours.   You have a fever.  MAKE SURE YOU:   Understand these instructions.   Will watch your condition.   Will get help right away if you are not doing well or get worse.  Document Released: 05/14/2005 Document Revised: 05/03/2011 Document Reviewed: 10/11/2010 [facility] Patient Information 2012 [place], [place].    Narcotic and benzodiazepine use may cause drowsiness, slowed breathing or dependence.  Please use with caution and do not drive, operate machinery or watch young children alone while taking them.  Taking combinations of these medications or drinking alcohol will potentiate these effects.

## 2011-10-03 ENCOUNTER — Encounter (HOSPITAL_COMMUNITY): Payer: Self-pay | Admitting: *Deleted

## 2011-10-03 ENCOUNTER — Emergency Department (HOSPITAL_COMMUNITY)
Admission: EM | Admit: 2011-10-03 | Discharge: 2011-10-03 | Disposition: A | Discharge status: Home / Self Care | Payer: BC Managed Care – PPO | Attending: Emergency Medicine | Admitting: Emergency Medicine

## 2011-10-03 DIAGNOSIS — R109 Unspecified abdominal pain: Secondary | ICD-10-CM | POA: Insufficient documentation

## 2011-10-03 DIAGNOSIS — R110 Nausea: Secondary | ICD-10-CM | POA: Insufficient documentation

## 2011-10-03 DIAGNOSIS — K861 Other chronic pancreatitis: Secondary | ICD-10-CM | POA: Insufficient documentation

## 2011-10-03 DIAGNOSIS — F17200 Nicotine dependence, unspecified, uncomplicated: Secondary | ICD-10-CM | POA: Insufficient documentation

## 2011-10-03 DIAGNOSIS — Z87442 Personal history of urinary calculi: Secondary | ICD-10-CM | POA: Insufficient documentation

## 2011-10-03 LAB — COMPREHENSIVE METABOLIC PANEL
Alkaline Phosphatase: 84 U/L (ref 39–117)
BUN: 12 mg/dL (ref 6–23)
CO2: 24 mEq/L (ref 19–32)
Chloride: 104 mEq/L (ref 96–112)
Creatinine, Ser: 0.78 mg/dL (ref 0.50–1.35)
GFR calc non Af Amer: >90 mL/min (ref >90)
Total Bilirubin: 0.3 mg/dL (ref 0.3–1.2)

## 2011-10-03 LAB — CBC
MCHC: 35 g/dL (ref 30.0–36.0)
MCV: 85.9 fL (ref 78.0–100.0)
Platelets: 223 10*3/uL (ref 150–400)
RDW: 12.6 % (ref 11.5–15.5)
WBC: 11.2 10*3/uL — ABNORMAL HIGH (ref 4.0–10.5)

## 2011-10-03 LAB — LIPASE, BLOOD: Lipase: 41 U/L (ref 11–59)

## 2011-10-03 MED ORDER — PROMETHAZINE HCL 25 MG PO TABS: 25.0000 mg | ORAL_TABLET | Freq: Four times a day (QID) | ORAL | Status: DC | PRN

## 2011-10-03 MED ORDER — ONDANSETRON HCL 4 MG/2ML IJ SOLN
4.0000 mg | Freq: Once | INTRAMUSCULAR | Status: AC
  Administered 2011-10-03: 4 mg via INTRAVENOUS
  Filled 2011-10-03: qty 2

## 2011-10-03 MED ORDER — SODIUM CHLORIDE 0.9 % IV BOLUS (SEPSIS)
1000.0000 mL | Freq: Once | INTRAVENOUS | Status: AC
  Administered 2011-10-03: 1000 mL via INTRAVENOUS

## 2011-10-03 MED ORDER — HYDROMORPHONE HCL PF 1 MG/ML IJ SOLN
1.0000 mg | Freq: Once | INTRAMUSCULAR | Status: AC
  Administered 2011-10-03: 1 mg via INTRAVENOUS
  Filled 2011-10-03: qty 1

## 2011-10-03 MED ORDER — HYDROCODONE-ACETAMINOPHEN 5-325 MG PO TABS: 1.0000 | ORAL_TABLET | ORAL | Status: AC | PRN

## 2011-10-03 NOTE — ED Notes (Signed)
PA at bedside.

## 2011-10-03 NOTE — ED Notes (Signed)
Pt is aware that he cannot be discharged until his wife arrives due to administration of narcotic medication

## 2011-10-03 NOTE — ED Notes (Signed)
Pt is waiting on his wife to arrive to take him home

## 2011-10-03 NOTE — ED Provider Notes (Signed)
History     CSN: [id number]  Arrival date & time 10/03/11  0335   First MD Initiated Contact with Patient 10/03/11 [phone]      Chief Complaint  Patient presents with  . Abdominal Pain    (Consider location/radiation/quality/duration/timing/severity/associated sxs/prior treatment) HPI Comments: Patient with a history of pancreatitis who is followed by Dr. Randa Evens in [place] presents tonight after being at work and developing nausea about 2300 - states that this was soon after followed by the onset of pain - states this is like his usual pancreatitis pain - states that he took a zofran with mild relief of the nausea - reports last flare was 2 months ago - reports then started with vomiting at that time - denies fever, chills, reports pain to epigastric area with radiation into his back.   Patient is a 36 y.o. male presenting with abdominal pain. The history is provided by the patient. No language interpreter was used.  Abdominal Pain The primary symptoms of the illness include abdominal pain and nausea. The primary symptoms of the illness do not include fever, fatigue, shortness of breath, vomiting, diarrhea, hematemesis, hematochezia or dysuria. The current episode started 3 to 5 hours ago. The onset of the illness was gradual. The problem has not changed since onset. The patient has not had a change in bowel habit. Additional symptoms associated with the illness include back pain. Symptoms associated with the illness do not include chills, anorexia, diaphoresis, heartburn, constipation, urgency, hematuria or frequency.    Past Medical History  Diagnosis Date  . Pancreatitis   . Pancreatitis   . Kidney stones     Past Surgical History  Procedure Date  . Appendectomy   . Cholecystectomy     No family history on file.  History  Substance Use Topics  . Smoking status: Current Everyday Smoker  . Smokeless tobacco: Not on file  . Alcohol Use: No      Review of Systems    Constitutional: Negative for fever, chills, diaphoresis and fatigue.  Respiratory: Negative for shortness of breath.   Gastrointestinal: Positive for nausea and abdominal pain. Negative for heartburn, vomiting, diarrhea, constipation, hematochezia, anorexia and hematemesis.  Genitourinary: Negative for dysuria, urgency, frequency and hematuria.  Musculoskeletal: Positive for back pain.  All other systems reviewed and are negative.    Allergies  Cortisone; Eggs or egg-derived products; and Ketorolac tromethamine  Home Medications   Current Outpatient Rx  Name Route Sig Dispense Refill  . AMYLASE-LIPASE-PROTEASE 05-01-11 MU PO CPEP Oral Take 1 capsule by mouth 2 (two) times daily with breakfast and lunch.     Marland Kitchen BISMUTH SUBSALICYLATE 262 MG PO CHEW Oral Chew 262 mg by mouth daily as needed. For stomach acid    . IBUPROFEN 200 MG PO TABS Oral Take 400 mg by mouth every 6 (six) hours as needed. For pain    . ONDANSETRON HCL 4 MG PO TABS Oral Take 4 mg by mouth every 8 (eight) hours as needed. nausea    . PANTOPRAZOLE SODIUM 40 MG PO TBEC Oral Take 1 tablet (40 mg total) by mouth daily. 14 tablet 0  . PROMETHAZINE HCL 25 MG PO TABS Oral Take 1 tablet (25 mg total) by mouth every 6 (six) hours as needed for nausea. 20 tablet 0  . PROMETHAZINE HCL 25 MG PO TABS Oral Take 1 tablet (25 mg total) by mouth every 6 (six) hours as needed for nausea. 20 tablet 0  . PROMETHAZINE HCL 25 MG PO  TABS Oral Take 1 tablet (25 mg total) by mouth every 6 (six) hours as needed for nausea. 30 tablet 0  . PROMETHAZINE HCL 25 MG PO TABS Oral Take 1 tablet (25 mg total) by mouth every 6 (six) hours as needed for nausea. 30 tablet 0    BP 147/119  Pulse 123  Temp(Src) 97.9 F (36.6 C) (Oral)  Resp 20  SpO2 99%  Physical Exam  Nursing note and vitals reviewed. Constitutional: He is oriented to person, place, and time. He appears well-developed and well-nourished. He appears distressed.       Uncomfortable  appearing  HENT:  Head: Normocephalic and atraumatic.  Right Ear: External ear normal.  Left Ear: External ear normal.  Nose: Nose normal.  Mouth/Throat: Oropharynx is clear and moist. No oropharyngeal exudate.  Eyes: Conjunctivae are normal. Pupils are equal, round, and reactive to light. No scleral icterus.  Neck: Normal range of motion. Neck supple.  Cardiovascular: Normal rate, regular rhythm and normal heart sounds.  Exam reveals no gallop and no friction rub.   No murmur heard. Pulmonary/Chest: Effort normal and breath sounds normal. No respiratory distress. He has no wheezes. He has no rales. He exhibits no tenderness.  Abdominal: Soft. Bowel sounds are normal. There is tenderness in the right upper quadrant, epigastric area and left upper quadrant. There is guarding. There is no rigidity and no rebound.    Musculoskeletal: Normal range of motion. He exhibits no edema and no tenderness.  Lymphadenopathy:    He has no cervical adenopathy.  Neurological: He is alert and oriented to person, place, and time. No cranial nerve deficit.  Skin: Skin is warm and dry. No rash noted. No erythema. No pallor.  Psychiatric: He has a normal mood and affect. His behavior is normal. Judgment and thought content normal.    ED Course  Procedures (including critical care time)  Labs Reviewed  COMPREHENSIVE METABOLIC PANEL - Abnormal; Notable for the following:    Potassium 3.2 (*)    Glucose, Bld 103 (*)    All other components within normal limits  CBC - Abnormal; Notable for the following:    WBC 11.2 (*)    All other components within normal limits  LIPASE, BLOOD   No results found.  Results for orders placed during the hospital encounter of 10/03/11  COMPREHENSIVE METABOLIC PANEL      Component Value Range   Sodium 140  135 - 145 (mEq/L)   Potassium 3.2 (*) 3.5 - 5.1 (mEq/L)   Chloride 104  96 - 112 (mEq/L)   CO2 24  19 - 32 (mEq/L)   Glucose, Bld 103 (*) 70 - 99 (mg/dL)   BUN 12   6 - 23 (mg/dL)   Creatinine, Ser 1.61  0.50 - 1.35 (mg/dL)   Calcium 9.6  8.4 - 09.6 (mg/dL)   Total Protein 7.5  6.0 - 8.3 (g/dL)   Albumin 3.9  3.5 - 5.2 (g/dL)   AST 22  0 - 37 (U/L)   ALT 13  0 - 53 (U/L)   Alkaline Phosphatase 84  39 - 117 (U/L)   Total Bilirubin 0.3  0.3 - 1.2 (mg/dL)   GFR calc non Af Amer >90  >90 (mL/min)   GFR calc Af Amer >90  >90 (mL/min)  LIPASE, BLOOD      Component Value Range   Lipase 41  11 - 59 (U/L)  CBC      Component Value Range   WBC 11.2 (*)  4.0 - 10.5 (K/uL)   RBC 5.05  4.22 - 5.81 (MIL/uL)   Hemoglobin 15.2  13.0 - 17.0 (g/dL)   HCT 91.4  78.2 - 95.6 (%)   MCV 85.9  78.0 - 100.0 (fL)   MCH 30.1  26.0 - 34.0 (pg)   MCHC 35.0  30.0 - 36.0 (g/dL)   RDW 21.3  08.6 - 57.8 (%)   Platelets 223  150 - 400 (K/uL)   No results found.   pancreatitis    MDM  Patient with history of pancreatitis presents with 3 hour history of pain and vomiting - labs are re-assuring that this is related to an exacerbation of the pancreatitis - exam of abd does not suggest a surgical emergency, patient reports pain under control after medication here - plan to send the patient home and he will follow up with PCP this week.  He is aware that he should start clear liquid diet, will also give pain control and nausea medication as well.        Izola Price [place], [place] 10/03/11 [phone]

## 2011-10-03 NOTE — ED Notes (Signed)
C/o abd pain, also nv, last ate 2200, last emesis 0300, (denies: fever, diarrhea, bleeding or other sx), "believes it is r/t pancreatitis", h/o same, last flare up 2 months ago, mentions recent med changes, denies ETOH.

## 2011-10-03 NOTE — ED Provider Notes (Signed)
Medical screening examination/treatment/procedure(s) were performed by non-physician practitioner and as supervising physician I was immediately available for consultation/collaboration.   Kyndall Chaplin, MD 10/03/11 0702 

## 2011-12-25 ENCOUNTER — Emergency Department (HOSPITAL_BASED_OUTPATIENT_CLINIC_OR_DEPARTMENT_OTHER)
Admission: EM | Admit: 2011-12-25 | Discharge: 2011-12-25 | Disposition: A | Discharge status: Home / Self Care | Payer: BC Managed Care – PPO | Attending: Emergency Medicine | Admitting: Emergency Medicine

## 2011-12-25 ENCOUNTER — Encounter (HOSPITAL_BASED_OUTPATIENT_CLINIC_OR_DEPARTMENT_OTHER): Payer: Self-pay | Admitting: Emergency Medicine

## 2011-12-25 ENCOUNTER — Emergency Department (HOSPITAL_BASED_OUTPATIENT_CLINIC_OR_DEPARTMENT_OTHER): Payer: BC Managed Care – PPO

## 2011-12-25 DIAGNOSIS — N201 Calculus of ureter: Secondary | ICD-10-CM | POA: Insufficient documentation

## 2011-12-25 DIAGNOSIS — R109 Unspecified abdominal pain: Secondary | ICD-10-CM | POA: Insufficient documentation

## 2011-12-25 LAB — URINE MICROSCOPIC-ADD ON

## 2011-12-25 LAB — CBC WITH DIFFERENTIAL/PLATELET
Basophils Absolute: 0 10*3/uL (ref 0.0–0.1)
Lymphs Abs: 3.5 10*3/uL (ref 0.7–4.0)
MCV: 82.1 fL (ref 78.0–100.0)
Monocytes Absolute: 1.1 10*3/uL — ABNORMAL HIGH (ref 0.1–1.0)
Monocytes Relative: 9 % (ref 3–12)
Neutrophils Relative %: 60 % (ref 43–77)
Platelets: 210 10*3/uL (ref 150–400)
RDW: 12.9 % (ref 11.5–15.5)
Smear Review: NORMAL
WBC: 12.5 10*3/uL — ABNORMAL HIGH (ref 4.0–10.5)

## 2011-12-25 LAB — URINALYSIS, ROUTINE W REFLEX MICROSCOPIC
Glucose, UA: NEGATIVE mg/dL
Specific Gravity, Urine: 1.022 (ref 1.005–1.030)
pH: 5.5 (ref 5.0–8.0)

## 2011-12-25 LAB — COMPREHENSIVE METABOLIC PANEL
AST: 27 U/L (ref 0–37)
Albumin: 5 g/dL (ref 3.5–5.2)
Calcium: 10.3 mg/dL (ref 8.4–10.5)
Creatinine, Ser: 0.8 mg/dL (ref 0.50–1.35)
Sodium: 141 mEq/L (ref 135–145)

## 2011-12-25 MED ORDER — HYDROMORPHONE HCL PF 1 MG/ML IJ SOLN
1.0000 mg | Freq: Once | INTRAMUSCULAR | Status: AC
  Administered 2011-12-25: 1 mg via INTRAVENOUS
  Filled 2011-12-25: qty 1

## 2011-12-25 MED ORDER — CIPROFLOXACIN HCL 500 MG PO TABS
500.0000 mg | ORAL_TABLET | Freq: Once | ORAL | Status: AC
  Administered 2011-12-25: 500 mg via ORAL
  Filled 2011-12-25: qty 1

## 2011-12-25 MED ORDER — ONDANSETRON HCL 4 MG PO TABS: 4.0000 mg | ORAL_TABLET | Freq: Four times a day (QID) | ORAL | Status: AC

## 2011-12-25 MED ORDER — CIPROFLOXACIN HCL 500 MG PO TABS: 500.0000 mg | ORAL_TABLET | Freq: Two times a day (BID) | ORAL | Status: AC

## 2011-12-25 MED ORDER — OXYCODONE-ACETAMINOPHEN 5-325 MG PO TABS: 2.0000 | ORAL_TABLET | ORAL | Status: AC | PRN

## 2011-12-25 MED ORDER — SODIUM CHLORIDE 0.9 % IV BOLUS (SEPSIS)
1000.0000 mL | Freq: Once | INTRAVENOUS | Status: AC
  Administered 2011-12-25: 1000 mL via INTRAVENOUS

## 2011-12-25 MED ORDER — IBUPROFEN 800 MG PO TABS: 800.0000 mg | ORAL_TABLET | Freq: Three times a day (TID) | ORAL | Status: AC

## 2011-12-25 MED ORDER — ONDANSETRON HCL 4 MG/2ML IJ SOLN
4.0000 mg | Freq: Once | INTRAMUSCULAR | Status: AC
  Administered 2011-12-25: 4 mg via INTRAVENOUS
  Filled 2011-12-25: qty 2

## 2011-12-25 MED ORDER — CIPROFLOXACIN IN D5W 400 MG/200ML IV SOLN: 400.0000 mg | Freq: Once | INTRAVENOUS | Status: DC

## 2011-12-25 NOTE — ED Provider Notes (Signed)
History     CSN: [id number]  Arrival date & time 12/25/11  1478   First MD Initiated Contact with Patient 12/25/11 0715      Chief Complaint  Patient presents with  . Flank Pain    (Consider location/radiation/quality/duration/timing/severity/associated sxs/prior treatment) HPI Comments: Patient presents with R flank pain that has been intermittent for the past 3 days. Hx kidney stones with one lithotripsy.  Associated with nausea and one episode of vomiting today.  Pain and "fullness" with urination without pain.  Denies fever.  Hx chronic pancreatitis as well.  Pain today does not feel like pancreatitis. No chest pain, SOB, fever.   The history is provided by the patient.    Past Medical History  Diagnosis Date  . Pancreatitis   . Pancreatitis   . Kidney stones     Past Surgical History  Procedure Date  . Appendectomy   . Cholecystectomy   . Kidney stone surgery     No family history on file.  History  Substance Use Topics  . Smoking status: Current Everyday Smoker  . Smokeless tobacco: Not on file  . Alcohol Use: No      Review of Systems  Constitutional: Negative for fever, activity change and appetite change.  HENT: Negative for congestion and rhinorrhea.   Respiratory: Negative for cough and shortness of breath.   Cardiovascular: Negative for chest pain.  Gastrointestinal: Positive for abdominal pain. Negative for nausea and vomiting.  Genitourinary: Positive for dysuria and flank pain. Negative for hematuria, difficulty urinating and testicular pain.  Musculoskeletal: Positive for back pain.  Skin: Negative for rash.  Neurological: Negative for dizziness, weakness and headaches.    Allergies  Cortisone; Eggs or egg-derived products; and Ketorolac tromethamine  Home Medications   Current Outpatient Rx  Name Route Sig Dispense Refill  . AMYLASE-LIPASE-PROTEASE 05-01-11 MU PO CPEP Oral Take 1 capsule by mouth 2 (two) times daily with breakfast and  lunch.     Marland Kitchen BISMUTH SUBSALICYLATE 262 MG PO CHEW Oral Chew 262 mg by mouth daily as needed. For stomach acid    . IBUPROFEN 200 MG PO TABS Oral Take 400 mg by mouth every 6 (six) hours as needed. For pain    . ONDANSETRON HCL 4 MG PO TABS Oral Take 4 mg by mouth every 8 (eight) hours as needed. nausea    . PANTOPRAZOLE SODIUM 40 MG PO TBEC Oral Take 1 tablet (40 mg total) by mouth daily. 14 tablet 0  . PROMETHAZINE HCL 25 MG PO TABS Oral Take 1 tablet (25 mg total) by mouth every 6 (six) hours as needed for nausea. 20 tablet 0  . PROMETHAZINE HCL 25 MG PO TABS Oral Take 1 tablet (25 mg total) by mouth every 6 (six) hours as needed for nausea. 30 tablet 0  . PROMETHAZINE HCL 25 MG PO TABS Oral Take 1 tablet (25 mg total) by mouth every 6 (six) hours as needed for nausea. 30 tablet 0  . PROMETHAZINE HCL 25 MG PO TABS Oral Take 1 tablet (25 mg total) by mouth every 6 (six) hours as needed for nausea. 20 tablet 0    BP 151/114  Pulse 97  Temp 97.7 F (36.5 C) (Oral)  Resp 18  SpO2 98%  Physical Exam  Constitutional: He is oriented to person, place, and time. He appears well-nourished. No distress.       uncomfortable  HENT:  Head: Normocephalic and atraumatic.  Mouth/Throat: No oropharyngeal exudate.  Eyes: Conjunctivae are  normal. Pupils are equal, round, and reactive to light.  Neck: Normal range of motion. Neck supple.  Cardiovascular: Normal rate, regular rhythm and normal heart sounds.   Pulmonary/Chest: Effort normal and breath sounds normal. No respiratory distress.  Abdominal: Soft. There is no tenderness. There is no rebound and no guarding.       Soft and nontender  Genitourinary:       No testicular pain  Musculoskeletal: Normal range of motion. He exhibits tenderness. He exhibits no edema.       R CVAT tenderness  Neurological: He is alert and oriented to person, place, and time. No cranial nerve deficit.  Skin: Skin is warm.    ED Course  Procedures (including  critical care time)  Labs Reviewed  CBC WITH DIFFERENTIAL - Abnormal; Notable for the following:    WBC 12.5 (*)     MCHC 36.8 (*)     All other components within normal limits  COMPREHENSIVE METABOLIC PANEL - Abnormal; Notable for the following:    Potassium 3.2 (*)     All other components within normal limits  URINALYSIS, ROUTINE W REFLEX MICROSCOPIC - Abnormal; Notable for the following:    Color, Urine RED (*)  BIOCHEMICALS MAY BE AFFECTED BY COLOR   APPearance CLOUDY (*)     Hgb urine dipstick LARGE (*)     Ketones, ur 15 (*)     Protein, ur 30 (*)     Leukocytes, UA SMALL (*)     All other components within normal limits  URINE MICROSCOPIC-ADD ON - Abnormal; Notable for the following:    Crystals CA OXALATE CRYSTALS (*)     All other components within normal limits  LIPASE, BLOOD  URINE CULTURE   Ct Abdomen Pelvis Wo Contrast  12/25/2011  *RADIOLOGY REPORT*  Clinical Data: Right-sided flank pain, nausea, history kidney stones with prior lithotripsy  CT ABDOMEN AND PELVIS WITHOUT CONTRAST  Technique:  Multidetector CT imaging of the abdomen and pelvis was performed following the standard protocol without intravenous contrast.  Comparison: CT abdomen pelvis of 10/16/2010 and abdomen films of 08/22/2011  Findings: The lung bases are clear.  The liver is unremarkable in the unenhanced state.  There is air within the intrahepatic ducts presumably due to prior sphincterotomy.  Surgical clips are present from cholecystectomy.  The pancreas is normal in size and the pancreatic duct is not dilated.  The adrenal glands and spleen are unremarkable.  The stomach is decompressed and cannot be evaluated well.  There are at least 2-3 small right renal calculi which are not well seen on the prior study.  No definite left renal calculi are seen.  There is fullness of the right pelvocaliceal system and right ureter to a point of low grade obstruction by several small adjacent distal right ureteral  calculi several centimeters above the right UV junction. The largest of these distal right ureteral calculi measures 4 mm in diameter on the coronal images.  The left ureter is normal in caliber.  The abdominal aorta is normal in caliber.  No adenopathy is seen. The urinary bladder is decompressed.  The prostate is normal in size and there are multiple prostatic calculi present.  No fluid is seen within the pelvis.  No abnormality of the colon is seen.  The terminal ileum is unremarkable.  A surgical clip is noted from prior appendectomy.  No bony abnormality is seen.  IMPRESSION:  1.  Mild right hydronephrosis and hydroureter to a point of  low grade obstruction by several small distal right ureteral calculi, the largest measuring 4 mm on coronal images. 2.  No left renal calculi are seen.  Original Report Authenticated By: Juline Patch, M.D.     1. Ureterolithiasis       MDM  R flank pain with urinary symptoms and history of kidney stones.  Vitals stable. No distress.  UA, IVF, symptom control.  Hematuria with 7-10 WBCs and small LE. Several R sided stones with mild hydronephrosis. Creatinine normal.   Do not suspect true infection. Urine culture sent and empiric cipro given. Results d/w [facility] triage nurse [facility] RN.  She has arranged appointment this morning at 945 with Diane NP.      Glynn Octave, MD 12/25/11 [phone]

## 2011-12-25 NOTE — ED Notes (Signed)
Pt back from CT, family member at bedside

## 2011-12-25 NOTE — ED Notes (Signed)
Patient transported to CT 

## 2011-12-25 NOTE — ED Notes (Signed)
Pt c/o RT flank pain, radiating to RT groin; hx of kidney stones

## 2011-12-26 LAB — URINE CULTURE: Colony Count: NO GROWTH

## 2012-01-25 ENCOUNTER — Emergency Department (HOSPITAL_BASED_OUTPATIENT_CLINIC_OR_DEPARTMENT_OTHER)
Admission: EM | Admit: 2012-01-25 | Discharge: 2012-01-25 | Disposition: A | Discharge status: Home / Self Care | Payer: BC Managed Care – PPO | Attending: Emergency Medicine | Admitting: Emergency Medicine

## 2012-01-25 ENCOUNTER — Encounter (HOSPITAL_BASED_OUTPATIENT_CLINIC_OR_DEPARTMENT_OTHER): Payer: Self-pay | Admitting: Emergency Medicine

## 2012-01-25 DIAGNOSIS — F17200 Nicotine dependence, unspecified, uncomplicated: Secondary | ICD-10-CM | POA: Insufficient documentation

## 2012-01-25 DIAGNOSIS — K859 Acute pancreatitis without necrosis or infection, unspecified: Secondary | ICD-10-CM | POA: Insufficient documentation

## 2012-01-25 DIAGNOSIS — Z79899 Other long term (current) drug therapy: Secondary | ICD-10-CM | POA: Insufficient documentation

## 2012-01-25 DIAGNOSIS — Z9089 Acquired absence of other organs: Secondary | ICD-10-CM | POA: Insufficient documentation

## 2012-01-25 LAB — CBC WITH DIFFERENTIAL/PLATELET
Eosinophils Relative: 2 % (ref 0–5)
HCT: 41.7 % (ref 39.0–52.0)
Lymphocytes Relative: 27 % (ref 12–46)
Lymphs Abs: 2.9 10*3/uL (ref 0.7–4.0)
MCV: 82.1 fL (ref 78.0–100.0)
Platelets: 218 10*3/uL (ref 150–400)
RBC: 5.08 MIL/uL (ref 4.22–5.81)
WBC: 10.8 10*3/uL — ABNORMAL HIGH (ref 4.0–10.5)

## 2012-01-25 LAB — URINALYSIS, ROUTINE W REFLEX MICROSCOPIC
Bilirubin Urine: NEGATIVE
Glucose, UA: NEGATIVE mg/dL
Hgb urine dipstick: NEGATIVE
Ketones, ur: NEGATIVE mg/dL
Protein, ur: NEGATIVE mg/dL

## 2012-01-25 LAB — COMPREHENSIVE METABOLIC PANEL
ALT: 23 U/L (ref 0–53)
Alkaline Phosphatase: 69 U/L (ref 39–117)
CO2: 27 mEq/L (ref 19–32)
Calcium: 9.8 mg/dL (ref 8.4–10.5)
GFR calc Af Amer: >90 mL/min (ref >90)
GFR calc non Af Amer: >90 mL/min (ref >90)
Glucose, Bld: 119 mg/dL — ABNORMAL HIGH (ref 70–99)
Sodium: 141 mEq/L (ref 135–145)
Total Bilirubin: 0.3 mg/dL (ref 0.3–1.2)

## 2012-01-25 MED ORDER — OXYCODONE-ACETAMINOPHEN 5-325 MG PO TABS: ORAL_TABLET | ORAL | Status: DC

## 2012-01-25 MED ORDER — SODIUM CHLORIDE 0.9 % IV BOLUS (SEPSIS)
1000.0000 mL | Freq: Once | INTRAVENOUS | Status: AC
  Administered 2012-01-25: 1000 mL via INTRAVENOUS

## 2012-01-25 MED ORDER — ONDANSETRON HCL 4 MG/2ML IJ SOLN
4.0000 mg | Freq: Once | INTRAMUSCULAR | Status: AC
  Administered 2012-01-25: 4 mg via INTRAVENOUS
  Filled 2012-01-25: qty 2

## 2012-01-25 MED ORDER — HYDROMORPHONE HCL PF 1 MG/ML IJ SOLN
1.0000 mg | Freq: Once | INTRAMUSCULAR | Status: AC
  Administered 2012-01-25: 1 mg via INTRAVENOUS
  Filled 2012-01-25: qty 1

## 2012-01-25 MED ORDER — POTASSIUM CHLORIDE CRYS ER 20 MEQ PO TBCR
40.0000 meq | EXTENDED_RELEASE_TABLET | Freq: Once | ORAL | Status: AC
  Administered 2012-01-25: 40 meq via ORAL
  Filled 2012-01-25: qty 2

## 2012-01-25 MED ORDER — PROMETHAZINE HCL 25 MG PO TABS: 25.0000 mg | ORAL_TABLET | Freq: Four times a day (QID) | ORAL | Status: DC | PRN

## 2012-01-25 MED ORDER — OXYCODONE-ACETAMINOPHEN 5-325 MG PO TABS
2.0000 | ORAL_TABLET | Freq: Once | ORAL | Status: AC
  Administered 2012-01-25: 2 via ORAL
  Filled 2012-01-25 (×2): qty 2

## 2012-01-25 NOTE — ED Notes (Signed)
MD at bedside. 

## 2012-01-25 NOTE — ED Provider Notes (Signed)
History     CSN: [id number]  Arrival date & time 01/25/12  1531   First MD Initiated Contact with Patient 01/25/12 1549      No chief complaint on file.   (Consider location/radiation/quality/duration/timing/severity/associated sxs/prior treatment) HPI Patient is a 36 year old male with history of pancreatitis who presents today with acute onset of pain 2 hours prior. Patient has had pancreatitis since cholecystectomy with pancreatic duct stent placement. He is on pancreatic enzymes for this. Patient reports eating a late breakfast of grits and bacon this morning. He cannot think of any exacerbating factors otherwise. Patient denies alcohol ingestion. He endorses nausea but denies any vomiting. There is been no constipation, diarrhea, fevers, blood in his stools, or sick contacts. He is normally followed by Dr. Randa Evens of [facility] gastroenterology. Patient denies any other symptoms. Pain is worse with palpation. Nothing has made it better. Normally his episodes are able to be controlled with home medications after initial stabilization the ER. There are no other associated or modifying factors. Patient reports his pain as a 10 out of 10 and sharp. There is no radiation of his pain. Past Medical History  Diagnosis Date  . Pancreatitis   . Pancreatitis   . Kidney stones     Past Surgical History  Procedure Date  . Appendectomy   . Cholecystectomy   . Kidney stone surgery     History reviewed. No pertinent family history.  History  Substance Use Topics  . Smoking status: Current Everyday Smoker  . Smokeless tobacco: Not on file  . Alcohol Use: No      Review of Systems  Constitutional: Negative.   HENT: Negative.   Eyes: Negative.   Respiratory: Negative.   Cardiovascular: Negative.   Gastrointestinal: Positive for nausea and abdominal pain.  Genitourinary: Negative.   Musculoskeletal: Negative.   Skin: Negative.   Neurological: Negative.   Hematological: Negative.     Psychiatric/Behavioral: Negative.   All other systems reviewed and are negative.    Allergies  Cortisone; Eggs or egg-derived products; and Ketorolac tromethamine  Home Medications   Current Outpatient Rx  Name Route Sig Dispense Refill  . AMYLASE-LIPASE-PROTEASE 05-01-11 MU PO CPEP Oral Take 1 capsule by mouth 2 (two) times daily with breakfast and lunch.     Marland Kitchen BISMUTH SUBSALICYLATE 262 MG PO CHEW Oral Chew 262 mg by mouth daily as needed. For stomach acid    . IBUPROFEN 200 MG PO TABS Oral Take 400 mg by mouth every 6 (six) hours as needed. For pain    . ONDANSETRON HCL 4 MG PO TABS Oral Take 4 mg by mouth every 8 (eight) hours as needed. nausea    . PANTOPRAZOLE SODIUM 40 MG PO TBEC Oral Take 1 tablet (40 mg total) by mouth daily. 14 tablet 0  . PROMETHAZINE HCL 25 MG PO TABS Oral Take 1 tablet (25 mg total) by mouth every 6 (six) hours as needed for nausea. 20 tablet 0  . PROMETHAZINE HCL 25 MG PO TABS Oral Take 1 tablet (25 mg total) by mouth every 6 (six) hours as needed for nausea. 30 tablet 0  . PROMETHAZINE HCL 25 MG PO TABS Oral Take 1 tablet (25 mg total) by mouth every 6 (six) hours as needed for nausea. 30 tablet 0  . PROMETHAZINE HCL 25 MG PO TABS Oral Take 1 tablet (25 mg total) by mouth every 6 (six) hours as needed for nausea. 20 tablet 0    BP 172/132  Pulse 134  Temp 98.5 F (36.9 C)  Resp 20  Ht 6\' 1"  (1.854 m)  Wt 200 lb (90.719 kg)  BMI 26.39 kg/m2  SpO2 100%  Physical Exam  Nursing note and vitals reviewed. GEN: Well-developed, well-nourished male in no distress, uncomfortable appearing HEENT: Atraumatic, normocephalic. Oropharynx clear without erythema EYES: PERRLA BL, no scleral icterus. NECK: Trachea midline, no meningismus CV: regular rate and rhythm. No murmurs, rubs, or gallops PULM: No respiratory distress.  No crackles, wheezes, or rales. GI: soft, very tender to palpation in the epigastric region. No guarding, rebound. + bowel sounds  GU:  deferred Neuro: cranial nerves grossly 2-12 intact, no abnormalities of strength or sensation, A and O x 3 MSK: Patient moves all 4 extremities symmetrically, no deformity, edema, or injury noted Skin: No rashes petechiae, purpura, or jaundice Psych: no abnormality of mood   ED Course  Procedures (including critical care time)  Labs Reviewed  CBC WITH DIFFERENTIAL - Abnormal; Notable for the following:    WBC 10.8 (*)     MCHC 36.7 (*)     All other components within normal limits  COMPREHENSIVE METABOLIC PANEL - Abnormal; Notable for the following:    Potassium 3.1 (*)     Glucose, Bld 119 (*)     All other components within normal limits  LIPASE, BLOOD - Abnormal; Notable for the following:    Lipase 66 (*)     All other components within normal limits  AMYLASE  URINALYSIS, ROUTINE W REFLEX MICROSCOPIC   No results found.   1. Pancreatitis       MDM  Patient was evaluated by myself. Based on prior history of pancreatitis as well as presentation similar to prior episodes today this is thought to be the most place we diagnosis. Patient is status post cholecystectomy as well as appendectomy. Patient did not have an acute abdomen. He was given IV fluids, lites, and Zofran for his symptoms. Laboratory workup for abdominal pain was sent. Patient will be reassessed following initial medications and laboratory studies.  4:36 PM Patient had hypokalemia with only slight elevation of lipase. Patient had mild leukocytosis which is improved compared to prior values. Urinalysis was unremarkable. Patient had some improvement in his pain and nausea but continued to complain of both. Second order of Dilaudid and Zofran was placed. Patient will receive oral potassium once nausea is better controlled.  6:01 PM Potassium replacements. Patient tolerated well.        Cyndra Numbers, MD 01/25/12 [phone]

## 2012-01-25 NOTE — ED Notes (Signed)
RUQ p[ain x 2 hrs with nausea

## 2012-02-12 ENCOUNTER — Emergency Department (HOSPITAL_COMMUNITY)
Admission: EM | Admit: 2012-02-12 | Discharge: 2012-02-12 | Disposition: A | Discharge status: Home / Self Care | Payer: BC Managed Care – PPO | Attending: Emergency Medicine | Admitting: Emergency Medicine

## 2012-02-12 ENCOUNTER — Encounter (HOSPITAL_COMMUNITY): Payer: Self-pay | Admitting: *Deleted

## 2012-02-12 DIAGNOSIS — F17200 Nicotine dependence, unspecified, uncomplicated: Secondary | ICD-10-CM | POA: Insufficient documentation

## 2012-02-12 DIAGNOSIS — K859 Acute pancreatitis without necrosis or infection, unspecified: Secondary | ICD-10-CM | POA: Insufficient documentation

## 2012-02-12 DIAGNOSIS — R1011 Right upper quadrant pain: Secondary | ICD-10-CM | POA: Insufficient documentation

## 2012-02-12 DIAGNOSIS — R109 Unspecified abdominal pain: Secondary | ICD-10-CM

## 2012-02-12 LAB — URINE MICROSCOPIC-ADD ON

## 2012-02-12 LAB — URINALYSIS, ROUTINE W REFLEX MICROSCOPIC
Bilirubin Urine: NEGATIVE
Glucose, UA: NEGATIVE mg/dL
Ketones, ur: NEGATIVE mg/dL
Nitrite: NEGATIVE
Protein, ur: NEGATIVE mg/dL
Specific Gravity, Urine: 1.02 (ref 1.005–1.030)
Urobilinogen, UA: 0.2 mg/dL (ref 0.0–1.0)
pH: 7 (ref 5.0–8.0)

## 2012-02-12 LAB — CBC
HCT: 44.2 % (ref 39.0–52.0)
Hemoglobin: 16.3 g/dL (ref 13.0–17.0)
MCH: 30.8 pg (ref 26.0–34.0)
MCHC: 36.9 g/dL — ABNORMAL HIGH (ref 30.0–36.0)
MCV: 83.4 fL (ref 78.0–100.0)
Platelets: 216 10*3/uL (ref 150–400)
RBC: 5.3 MIL/uL (ref 4.22–5.81)
RDW: 12.4 % (ref 11.5–15.5)
WBC: 12.8 10*3/uL — ABNORMAL HIGH (ref 4.0–10.5)

## 2012-02-12 LAB — COMPREHENSIVE METABOLIC PANEL
ALT: 32 U/L (ref 0–53)
AST: 28 U/L (ref 0–37)
Albumin: 4.4 g/dL (ref 3.5–5.2)
Alkaline Phosphatase: 66 U/L (ref 39–117)
BUN: 8 mg/dL (ref 6–23)
CO2: 26 mEq/L (ref 19–32)
Calcium: 9.9 mg/dL (ref 8.4–10.5)
Chloride: 102 mEq/L (ref 96–112)
Creatinine, Ser: 0.79 mg/dL (ref 0.50–1.35)
GFR calc Af Amer: >90 mL/min (ref >90)
GFR calc non Af Amer: >90 mL/min (ref >90)
Glucose, Bld: 92 mg/dL (ref 70–99)
Potassium: 3.4 mEq/L — ABNORMAL LOW (ref 3.5–5.1)
Sodium: 139 mEq/L (ref 135–145)
Total Bilirubin: 0.4 mg/dL (ref 0.3–1.2)
Total Protein: 7.6 g/dL (ref 6.0–8.3)

## 2012-02-12 LAB — LIPASE, BLOOD: Lipase: 56 U/L (ref 11–59)

## 2012-02-12 MED ORDER — SODIUM CHLORIDE 0.9 % IV BOLUS (SEPSIS)
1000.0000 mL | Freq: Once | INTRAVENOUS | Status: AC
  Administered 2012-02-12: 1000 mL via INTRAVENOUS

## 2012-02-12 MED ORDER — HYDROMORPHONE HCL PF 1 MG/ML IJ SOLN
1.0000 mg | Freq: Once | INTRAMUSCULAR | Status: AC
Start: 2012-02-12 — End: 2012-02-12
  Administered 2012-02-12: 1 mg via INTRAVENOUS
  Filled 2012-02-12: qty 1

## 2012-02-12 MED ORDER — HYDROMORPHONE HCL PF 1 MG/ML IJ SOLN
1.0000 mg | Freq: Once | INTRAMUSCULAR | Status: AC
  Administered 2012-02-12: 1 mg via INTRAVENOUS
  Filled 2012-02-12: qty 1

## 2012-02-12 MED ORDER — POTASSIUM CHLORIDE CRYS ER 20 MEQ PO TBCR
20.0000 meq | EXTENDED_RELEASE_TABLET | Freq: Once | ORAL | Status: AC
  Administered 2012-02-12: 20 meq via ORAL
  Filled 2012-02-12: qty 1

## 2012-02-12 MED ORDER — ONDANSETRON HCL 4 MG/2ML IJ SOLN
4.0000 mg | Freq: Once | INTRAMUSCULAR | Status: AC
  Administered 2012-02-12: 4 mg via INTRAVENOUS
  Filled 2012-02-12: qty 2

## 2012-02-12 NOTE — ED Provider Notes (Signed)
History    36yM with abdominal pain. Hx of chronic abdominal pain which he attributes to pancreatitis. Worse around 0300 this morning. Epigastric. Does not radiate. No appreciable exacerbating or relieving factors. Nauseated and vomited x3. NBNB emesis. No urinary complaints. No fever or chills. No diarrhea. S/p chole and pancreatic duct stenting.   CSN: [id number]  Arrival date & time 02/12/12  0532   First MD Initiated Contact with Patient 02/12/12 [phone]      Chief Complaint  Patient presents with  . Abdominal Pain    URQ    (Consider location/radiation/quality/duration/timing/severity/associated sxs/prior treatment) HPI  Past Medical History  Diagnosis Date  . Pancreatitis   . Pancreatitis   . Kidney stones     Past Surgical History  Procedure Date  . Appendectomy   . Cholecystectomy   . Kidney stone surgery     History reviewed. No pertinent family history.  History  Substance Use Topics  . Smoking status: Current Every Day Smoker  . Smokeless tobacco: Not on file  . Alcohol Use: No      Review of Systems   Review of symptoms negative unless otherwise noted in HPI.   Allergies  Cortisone; Eggs or egg-derived products; and Ketorolac tromethamine  Home Medications   Current Outpatient Rx  Name Route Sig Dispense Refill  . PANTOPRAZOLE SODIUM 40 MG PO TBEC Oral Take 1 tablet (40 mg total) by mouth daily. 14 tablet 0  . PROMETHAZINE HCL 25 MG PO TABS Oral Take 25 mg by mouth every 6 (six) hours as needed. For nausea    . AMYLASE-LIPASE-PROTEASE 05-01-11 MU PO CPEP Oral Take 1 capsule by mouth 2 (two) times daily with breakfast and lunch.       BP 147/104  Pulse 100  Temp 97.8 F (36.6 C) (Oral)  Resp 26  Ht 6\' 1"  (1.854 m)  Wt 205 lb (92.987 kg)  BMI 27.05 kg/m2  SpO2 99%  Physical Exam  Nursing note and vitals reviewed. Constitutional: He appears well-developed and well-nourished. No distress.  HENT:  Head: Normocephalic and atraumatic.    Eyes: Conjunctivae normal are normal. Right eye exhibits no discharge. Left eye exhibits no discharge.  Neck: Neck supple.  Cardiovascular: Normal rate, regular rhythm and normal heart sounds.  Exam reveals no gallop and no friction rub.   No murmur heard. Pulmonary/Chest: Effort normal and breath sounds normal. No respiratory distress.  Abdominal: Soft. He exhibits no distension. There is tenderness. There is no rebound and no guarding.       Fentanyl patch L abdomen. Tenderness in epigastrium w/o rebound or guarding  Genitourinary:       No CVA tenderness  Musculoskeletal: He exhibits no edema and no tenderness.  Neurological: He is alert.  Skin: Skin is warm and dry.  Psychiatric: He has a normal mood and affect. His behavior is normal. Thought content normal.    ED Course  Procedures (including critical care time)  Labs Reviewed  CBC - Abnormal; Notable for the following:    WBC 12.8 (*)     MCHC 36.9 (*)     All other components within normal limits  COMPREHENSIVE METABOLIC PANEL - Abnormal; Notable for the following:    Potassium 3.4 (*)     All other components within normal limits  URINALYSIS, ROUTINE W REFLEX MICROSCOPIC - Abnormal; Notable for the following:    APPearance CLOUDY (*)     Hgb urine dipstick SMALL (*)     Leukocytes, UA TRACE (*)  All other components within normal limits  URINE MICROSCOPIC-ADD ON - Abnormal; Notable for the following:    Bacteria, UA MANY (*)     Casts HYALINE CASTS (*)     Crystals CA OXALATE CRYSTALS (*)     All other components within normal limits  LIPASE, BLOOD  URINE CULTURE   No results found.   1. Abdominal pain       MDM  36yyM with abdominal pain. HX of chronic pain which he attributes to pancreatitis. W/u reassuring. UA with a low of bacteria and rare epithelial cells. No urinary symptoms and exam not consistent with cystitis or pyelonephritis. Urine culture sent, but will defer from abx at this time. Some  tenderness on exam, but not peritoneal. Reports improvement of symptoms with meds. Feel safe for discharge. Return precautions discussed.        Raeford Razor, MD 02/12/12 [phone]

## 2012-02-12 NOTE — ED Notes (Addendum)
Pt reports being awoken from sleep with RUQ pain. Pt states he ate something spicy last night that might have flared up his pancreatitis. Pt reports n/v. Vomiting x 3. Skin is warm and diaphoretic

## 2012-02-12 NOTE — ED Notes (Signed)
Pt states last night he ate something spicy, hx of pancreatitis, and woke up around 3 am having R sided abdominal pain.

## 2012-02-12 NOTE — ED Notes (Signed)
Pt escorted to discharge window, in no distress, discharge instructions explained.

## 2012-02-13 LAB — URINE CULTURE
Colony Count: NO GROWTH
Culture: NO GROWTH

## 2012-02-22 ENCOUNTER — Encounter (HOSPITAL_BASED_OUTPATIENT_CLINIC_OR_DEPARTMENT_OTHER): Payer: Self-pay | Admitting: Emergency Medicine

## 2012-02-22 ENCOUNTER — Emergency Department (HOSPITAL_BASED_OUTPATIENT_CLINIC_OR_DEPARTMENT_OTHER)
Admission: EM | Admit: 2012-02-22 | Discharge: 2012-02-22 | Disposition: A | Discharge status: Home / Self Care | Payer: BC Managed Care – PPO | Attending: Emergency Medicine | Admitting: Emergency Medicine

## 2012-02-22 DIAGNOSIS — K859 Acute pancreatitis without necrosis or infection, unspecified: Secondary | ICD-10-CM | POA: Insufficient documentation

## 2012-02-22 DIAGNOSIS — F17200 Nicotine dependence, unspecified, uncomplicated: Secondary | ICD-10-CM | POA: Insufficient documentation

## 2012-02-22 DIAGNOSIS — R109 Unspecified abdominal pain: Secondary | ICD-10-CM

## 2012-02-22 LAB — CBC WITH DIFFERENTIAL/PLATELET
Eosinophils Relative: 2 % (ref 0–5)
Hemoglobin: 16.8 g/dL (ref 13.0–17.0)
Lymphocytes Relative: 27 % (ref 12–46)
Lymphs Abs: 3.2 10*3/uL (ref 0.7–4.0)
MCV: 83.9 fL (ref 78.0–100.0)
Monocytes Relative: 11 % (ref 3–12)
Neutrophils Relative %: 59 % (ref 43–77)
Platelets: 235 10*3/uL (ref 150–400)
RBC: 5.45 MIL/uL (ref 4.22–5.81)
WBC: 11.9 10*3/uL — ABNORMAL HIGH (ref 4.0–10.5)

## 2012-02-22 LAB — URINALYSIS, ROUTINE W REFLEX MICROSCOPIC
Hgb urine dipstick: NEGATIVE
Specific Gravity, Urine: 1.012 (ref 1.005–1.030)
Urobilinogen, UA: 0.2 mg/dL (ref 0.0–1.0)
pH: 5.5 (ref 5.0–8.0)

## 2012-02-22 LAB — COMPREHENSIVE METABOLIC PANEL
ALT: 21 U/L (ref 0–53)
Alkaline Phosphatase: 71 U/L (ref 39–117)
CO2: 29 mEq/L (ref 19–32)
GFR calc Af Amer: >90 mL/min (ref >90)
GFR calc non Af Amer: >90 mL/min (ref >90)
Glucose, Bld: 92 mg/dL (ref 70–99)
Potassium: 3.2 mEq/L — ABNORMAL LOW (ref 3.5–5.1)
Sodium: 142 mEq/L (ref 135–145)

## 2012-02-22 MED ORDER — ONDANSETRON HCL 4 MG/2ML IJ SOLN
4.0000 mg | Freq: Once | INTRAMUSCULAR | Status: AC
  Administered 2012-02-22: 4 mg via INTRAVENOUS
  Filled 2012-02-22: qty 2

## 2012-02-22 MED ORDER — HYDROMORPHONE HCL PF 1 MG/ML IJ SOLN
1.0000 mg | Freq: Once | INTRAMUSCULAR | Status: AC
  Administered 2012-02-22: 1 mg via INTRAVENOUS
  Filled 2012-02-22: qty 1

## 2012-02-22 MED ORDER — OXYCODONE-ACETAMINOPHEN 5-325 MG PO TABS: 1.0000 | ORAL_TABLET | Freq: Four times a day (QID) | ORAL | Status: DC | PRN

## 2012-02-22 NOTE — ED Notes (Signed)
Started 4 days ago with N/V/D. Got better then today started with RUQ pain and N/V. Hx of pancreatitis. Recently taken off Pancreatase by Dr. Randa Evens.

## 2012-02-22 NOTE — ED Notes (Signed)
[facility] called for report  

## 2012-02-22 NOTE — ED Provider Notes (Signed)
History     CSN: [id number]  Arrival date & time 02/22/12  0707   First MD Initiated Contact with Patient 02/22/12 [phone]      Chief Complaint  Patient presents with  . Abdominal Pain    (Consider location/radiation/quality/duration/timing/severity/associated sxs/prior treatment) HPI Comments: Patient with history of chronic pancreatitis, status post cbd stenting in the past presents with epigastric abd pain, nausea and vomiting for the past four days.  Wears a fentanyl patch but this is not helping.  Was recently taken off of pancreatase by his GI doctor.    Patient is a 36 y.o. male presenting with abdominal pain. The history is provided by the patient.  Abdominal Pain The primary symptoms of the illness include abdominal pain and nausea. The primary symptoms of the illness do not include fever, vomiting or diarrhea. Episode onset: 4 days ago. The onset of the illness was gradual. The problem has been rapidly worsening.  Symptoms associated with the illness do not include chills or constipation.    Past Medical History  Diagnosis Date  . Pancreatitis   . Pancreatitis   . Kidney stones     Past Surgical History  Procedure Date  . Appendectomy   . Cholecystectomy   . Kidney stone surgery     No family history on file.  History  Substance Use Topics  . Smoking status: Current Every Day Smoker  . Smokeless tobacco: Not on file  . Alcohol Use: No      Review of Systems  Constitutional: Negative for fever and chills.  Gastrointestinal: Positive for nausea and abdominal pain. Negative for vomiting, diarrhea and constipation.  All other systems reviewed and are negative.    Allergies  Cortisone; Eggs or egg-derived products; and Ketorolac tromethamine  Home Medications   Current Outpatient Rx  Name Route Sig Dispense Refill  . PANTOPRAZOLE SODIUM 40 MG PO TBEC Oral Take 1 tablet (40 mg total) by mouth daily. 14 tablet 0  . PROMETHAZINE HCL 25 MG PO TABS Oral  Take 25 mg by mouth every 6 (six) hours as needed. For nausea      BP 143/106  Pulse 126  Temp 98.3 F (36.8 C) (Oral)  Resp 18  Ht 6\' 1"  (1.854 m)  Wt 200 lb (90.719 kg)  BMI 26.39 kg/m2  SpO2 98%  Physical Exam  Nursing note and vitals reviewed. Constitutional: He appears well-developed and well-nourished.       Appears uncomfortable.  HENT:  Head: Normocephalic and atraumatic.  Mouth/Throat: Oropharynx is clear and moist.  Neck: Normal range of motion. Neck supple.  Cardiovascular: Normal rate and regular rhythm.   No murmur heard. Pulmonary/Chest: Effort normal and breath sounds normal. No respiratory distress.  Abdominal: Soft. Bowel sounds are normal.       TTP in the epigastrium without rebound.  There is voluntary guarding.  Bowel sounds are present.    Lymphadenopathy:    He has no cervical adenopathy.    ED Course  Procedures (including critical care time)   Labs Reviewed  URINALYSIS, ROUTINE W REFLEX MICROSCOPIC  CBC WITH DIFFERENTIAL  COMPREHENSIVE METABOLIC PANEL  LIPASE, BLOOD   No results found.   No diagnosis found.    MDM  The patient was given fluids, pain meds and is feeling better.  The labs are reassuring.  He needs to follow up with his gi doctor to discuss his medications.  To return prn.        Geoffery Lyons, MD 02/22/12 [phone]

## 2012-03-29 ENCOUNTER — Encounter (HOSPITAL_COMMUNITY): Payer: Self-pay | Admitting: Emergency Medicine

## 2012-03-29 ENCOUNTER — Emergency Department (HOSPITAL_COMMUNITY): Payer: BC Managed Care – PPO

## 2012-03-29 ENCOUNTER — Emergency Department (HOSPITAL_COMMUNITY)
Admission: EM | Admit: 2012-03-29 | Discharge: 2012-03-29 | Disposition: A | Discharge status: Home / Self Care | Payer: BC Managed Care – PPO | Attending: Emergency Medicine | Admitting: Emergency Medicine

## 2012-03-29 DIAGNOSIS — R109 Unspecified abdominal pain: Secondary | ICD-10-CM

## 2012-03-29 DIAGNOSIS — Z87442 Personal history of urinary calculi: Secondary | ICD-10-CM | POA: Insufficient documentation

## 2012-03-29 DIAGNOSIS — R112 Nausea with vomiting, unspecified: Secondary | ICD-10-CM | POA: Insufficient documentation

## 2012-03-29 DIAGNOSIS — K861 Other chronic pancreatitis: Secondary | ICD-10-CM

## 2012-03-29 LAB — COMPREHENSIVE METABOLIC PANEL
Albumin: 4.2 g/dL (ref 3.5–5.2)
Alkaline Phosphatase: 64 U/L (ref 39–117)
BUN: 7 mg/dL (ref 6–23)
Chloride: 101 mEq/L (ref 96–112)
Glucose, Bld: 85 mg/dL (ref 70–99)
Potassium: 3.3 mEq/L — ABNORMAL LOW (ref 3.5–5.1)
Total Bilirubin: 0.5 mg/dL (ref 0.3–1.2)

## 2012-03-29 LAB — CBC
HCT: 45.2 % (ref 39.0–52.0)
Hemoglobin: 16.3 g/dL (ref 13.0–17.0)
RBC: 5.34 MIL/uL (ref 4.22–5.81)
WBC: 11.3 10*3/uL — ABNORMAL HIGH (ref 4.0–10.5)

## 2012-03-29 LAB — URINALYSIS, ROUTINE W REFLEX MICROSCOPIC
Glucose, UA: NEGATIVE mg/dL
Ketones, ur: NEGATIVE mg/dL
Leukocytes, UA: NEGATIVE
Protein, ur: NEGATIVE mg/dL

## 2012-03-29 LAB — LIPASE, BLOOD: Lipase: 153 U/L — ABNORMAL HIGH (ref 11–59)

## 2012-03-29 MED ORDER — HYDROMORPHONE HCL PF 1 MG/ML IJ SOLN
1.0000 mg | Freq: Once | INTRAMUSCULAR | Status: AC
  Administered 2012-03-29: 1 mg via INTRAVENOUS
  Filled 2012-03-29: qty 1

## 2012-03-29 MED ORDER — SODIUM CHLORIDE 0.9 % IV BOLUS (SEPSIS)
1000.0000 mL | Freq: Once | INTRAVENOUS | Status: AC
  Administered 2012-03-29: 1000 mL via INTRAVENOUS

## 2012-03-29 MED ORDER — POTASSIUM CHLORIDE CRYS ER 20 MEQ PO TBCR
40.0000 meq | EXTENDED_RELEASE_TABLET | Freq: Once | ORAL | Status: AC
  Administered 2012-03-29: 40 meq via ORAL
  Filled 2012-03-29: qty 2

## 2012-03-29 MED ORDER — IOHEXOL 300 MG/ML  SOLN: 100.0000 mL | Freq: Once | INTRAMUSCULAR | Status: DC | PRN

## 2012-03-29 MED ORDER — OXYCODONE-ACETAMINOPHEN 5-325 MG PO TABS: 2.0000 | ORAL_TABLET | ORAL | Status: DC | PRN

## 2012-03-29 MED ORDER — ONDANSETRON HCL 4 MG/2ML IJ SOLN
4.0000 mg | Freq: Once | INTRAMUSCULAR | Status: AC
  Administered 2012-03-29: 4 mg via INTRAVENOUS
  Filled 2012-03-29: qty 2

## 2012-03-29 MED ORDER — PROMETHAZINE HCL 25 MG PO TABS: 25.0000 mg | ORAL_TABLET | Freq: Four times a day (QID) | ORAL | Status: DC | PRN

## 2012-03-29 NOTE — ED Provider Notes (Signed)
History     CSN: [id number]  Arrival date & time 03/29/12  0535   First MD Initiated Contact with Patient 03/29/12 0840      Chief Complaint  Patient presents with  . Abdominal Pain    (Consider location/radiation/quality/duration/timing/severity/associated sxs/prior treatment) The history is provided by the patient. No language interpreter was used.  [name] is a 36 y.o. male history of chronic pancreatitis status post cholecystectomy, kidney stones here presenting with abdominal pain. Acute abdominal pain starting around 3 AM this morning. It woke him up from sleep. He then had some nausea and vomiting. The pain is in the epigastric area and is described as sharp and nonradiating. Denies any urinary symptoms or constipation or diarrhea. He states that this is similar to his previous pancreatitis.   Past Medical History  Diagnosis Date  . Pancreatitis   . Pancreatitis   . Kidney stones     Past Surgical History  Procedure Date  . Appendectomy   . Cholecystectomy   . Kidney stone surgery     No family history on file.  History  Substance Use Topics  . Smoking status: Current Every Day Smoker  . Smokeless tobacco: Not on file  . Alcohol Use: No      Review of Systems  Gastrointestinal: Positive for nausea, vomiting and abdominal pain.  All other systems reviewed and are negative.    Allergies  Cortisone; Eggs or egg-derived products; and Ketorolac tromethamine  Home Medications   Current Outpatient Rx  Name Route Sig Dispense Refill  . FENTANYL 25 MCG/HR TD PT72 Transdermal Place 1 patch onto the skin every 3 (three) days.    Marland Kitchen PANTOPRAZOLE SODIUM 40 MG PO TBEC Oral Take 1 tablet (40 mg total) by mouth daily. 14 tablet 0    BP 164/105  Pulse 125  Temp 98 F (36.7 C) (Oral)  Resp 15  SpO2 99%  Physical Exam  Nursing note and vitals reviewed. Constitutional: He is oriented to person, place, and time. He appears well-nourished.   Uncomfortable, holding abdomen.   HENT:  Head: Normocephalic.       MM dry   Eyes: Conjunctivae normal are normal. Pupils are equal, round, and reactive to light.  Neck: Normal range of motion. Neck supple.  Cardiovascular: Normal rate, regular rhythm and normal heart sounds.   Pulmonary/Chest: Effort normal and breath sounds normal. No respiratory distress. He has no wheezes.  Abdominal:       Slightly firm in epigastric area, + tenderness in epigastrium but no rebound. No CVAT   Musculoskeletal: Normal range of motion.  Neurological: He is alert and oriented to person, place, and time.  Skin: Skin is warm and dry.  Psychiatric: He has a normal mood and affect. His behavior is normal. Judgment and thought content normal.    ED Course  Procedures (including critical care time)  Labs Reviewed  LIPASE, BLOOD - Abnormal; Notable for the following:    Lipase 153 (*)     All other components within normal limits  CBC - Abnormal; Notable for the following:    WBC 11.3 (*)     MCHC 36.1 (*)  CORRECTED FOR INTERFERING SUBSTANCE   All other components within normal limits  COMPREHENSIVE METABOLIC PANEL - Abnormal; Notable for the following:    Potassium 3.3 (*)     All other components within normal limits  URINALYSIS, ROUTINE W REFLEX MICROSCOPIC   Ct Abdomen Pelvis W Contrast  03/29/2012  *RADIOLOGY REPORT*  Clinical Data: Right-sided abdominal pain and back pain. Leukocytosis.  Elevated serum amylase.  CT ABDOMEN AND PELVIS WITH CONTRAST  Technique:  Multidetector CT imaging of the abdomen and pelvis was performed following the standard protocol during bolus administration of intravenous contrast.  Contrast:  100 ml Omnipaque-300 and oral contrast  Comparison: 12/25/2011  Findings: Prior cholecystectomy again demonstrated.  Mild biliary dilatation and pneumobilia again demonstrated.  No liver masses are identified.  Pancreas is normal appearance.  No evidence of pancreatic mass or  peripancreatic inflammatory changes.  The spleen, adrenal glands, and kidneys are normal appearance except for small nonobstructing calculi in the mid and lower poles of the right kidney.  No evidence of ureteral calculi or hydronephrosis.  No other soft tissue masses or lymphadenopathy identified within the abdomen or pelvis.  No evidence of inflammatory process or abnormal fluid collections.  No evidence of dilated bowel loops or hernia.  No suspicious bone lesions are identified.  IMPRESSION:  1.  No CT evidence of pancreatitis or other acute findings. 2.  Stable biliary dilatation and pneumobilia status post cholecystectomy. 3.  Stable nonobstructing right nephrolithiasis.  No evidence of ureteral calculi or hydronephrosis.   Original Report Authenticated By: Myles Rosenthal, M.D.      1. Chronic pancreatitis   2. Abdominal pain       MDM  [name] is a 36 y.o. male here with abdominal pain. Lipase elevate, likely pancreatitis. Will give IVF and pain meds and order CT ab/pel to r/o pancreatitis.   10:52 AM Patient is now pain free. CT ab/pel showed no pancreatitis. Elevated lipase is probably secondary to chronic pancreatitis. Will d/c home on percocet and phenergan. Patient will f/u with his GI doctor.          Richardean Canal, MD 03/29/12 1053

## 2012-03-29 NOTE — Discharge Instructions (Signed)
Take percocet as prescribed for pain. Do not drive with percocet. Take phenergan for nausea.  Follow up with your GI doctor.   Return to ER if you have severe pain, vomiting.

## 2012-03-29 NOTE — ED Notes (Signed)
Pt has finished contrast.  

## 2012-03-29 NOTE — ED Notes (Signed)
Pt alert, arrives from home, c/o gen abd pain, onset was this evening, describes pain as sharp, non radiating, right sided, pt appears to be in discomfort, resp even unlabored

## 2012-04-15 ENCOUNTER — Encounter (HOSPITAL_BASED_OUTPATIENT_CLINIC_OR_DEPARTMENT_OTHER): Payer: Self-pay | Admitting: *Deleted

## 2012-04-15 ENCOUNTER — Emergency Department (HOSPITAL_BASED_OUTPATIENT_CLINIC_OR_DEPARTMENT_OTHER)
Admission: EM | Admit: 2012-04-15 | Discharge: 2012-04-15 | Disposition: A | Discharge status: Home / Self Care | Payer: BC Managed Care – PPO | Attending: Emergency Medicine | Admitting: Emergency Medicine

## 2012-04-15 DIAGNOSIS — F17200 Nicotine dependence, unspecified, uncomplicated: Secondary | ICD-10-CM | POA: Insufficient documentation

## 2012-04-15 DIAGNOSIS — Z87442 Personal history of urinary calculi: Secondary | ICD-10-CM | POA: Insufficient documentation

## 2012-04-15 DIAGNOSIS — K861 Other chronic pancreatitis: Secondary | ICD-10-CM | POA: Insufficient documentation

## 2012-04-15 DIAGNOSIS — R110 Nausea: Secondary | ICD-10-CM | POA: Insufficient documentation

## 2012-04-15 DIAGNOSIS — Z79899 Other long term (current) drug therapy: Secondary | ICD-10-CM | POA: Insufficient documentation

## 2012-04-15 LAB — COMPREHENSIVE METABOLIC PANEL
ALT: 24 U/L (ref 0–53)
AST: 23 U/L (ref 0–37)
Albumin: 4.2 g/dL (ref 3.5–5.2)
Alkaline Phosphatase: 71 U/L (ref 39–117)
Chloride: 101 mEq/L (ref 96–112)
Potassium: 3.5 mEq/L (ref 3.5–5.1)
Sodium: 141 mEq/L (ref 135–145)
Total Bilirubin: 0.4 mg/dL (ref 0.3–1.2)

## 2012-04-15 LAB — CBC WITH DIFFERENTIAL/PLATELET
Basophils Absolute: 0 10*3/uL (ref 0.0–0.1)
Basophils Relative: 0 % (ref 0–1)
MCHC: 36.8 g/dL — ABNORMAL HIGH (ref 30.0–36.0)
Neutro Abs: 5.7 10*3/uL (ref 1.7–7.7)
Neutrophils Relative %: 58 % (ref 43–77)
RDW: 12.4 % (ref 11.5–15.5)
WBC: 9.7 10*3/uL (ref 4.0–10.5)

## 2012-04-15 LAB — URINALYSIS, ROUTINE W REFLEX MICROSCOPIC
Ketones, ur: NEGATIVE mg/dL
Leukocytes, UA: NEGATIVE
Nitrite: NEGATIVE
Protein, ur: NEGATIVE mg/dL
pH: 7.5 (ref 5.0–8.0)

## 2012-04-15 MED ORDER — SODIUM CHLORIDE 0.9 % IV BOLUS (SEPSIS)
1000.0000 mL | Freq: Once | INTRAVENOUS | Status: AC
  Administered 2012-04-15: 1000 mL via INTRAVENOUS

## 2012-04-15 MED ORDER — OXYCODONE-ACETAMINOPHEN 5-325 MG PO TABS: 1.0000 | ORAL_TABLET | Freq: Four times a day (QID) | ORAL | Status: DC | PRN

## 2012-04-15 MED ORDER — ONDANSETRON HCL 4 MG/2ML IJ SOLN
4.0000 mg | Freq: Once | INTRAMUSCULAR | Status: AC
  Administered 2012-04-15: 4 mg via INTRAVENOUS
  Filled 2012-04-15: qty 2

## 2012-04-15 MED ORDER — HYDROMORPHONE HCL PF 1 MG/ML IJ SOLN
1.0000 mg | Freq: Once | INTRAMUSCULAR | Status: AC
  Administered 2012-04-15: 1 mg via INTRAVENOUS
  Filled 2012-04-15: qty 1

## 2012-04-15 MED ORDER — ONDANSETRON 8 MG PO TBDP: ORAL_TABLET | ORAL | Status: DC

## 2012-04-15 NOTE — ED Provider Notes (Signed)
History     CSN: [id number]  Arrival date & time 04/15/12  4098   First MD Initiated Contact with Patient 04/15/12 0115      Chief Complaint  Patient presents with  . Abdominal Pain    (Consider location/radiation/quality/duration/timing/severity/associated sxs/prior treatment) Patient is a 36 y.o. male presenting with abdominal pain. The history is provided by the patient.  Abdominal Pain The primary symptoms of the illness include abdominal pain and nausea. The primary symptoms of the illness do not include fever, fatigue, shortness of breath, vomiting or diarrhea. The current episode started 1 to 2 hours ago. The onset of the illness was sudden. The problem has not changed since onset. The abdominal pain began 1 to 2 hours ago. The abdominal pain is located in the RUQ, epigastric region and LUQ. The abdominal pain does not radiate. The severity of the abdominal pain is 10/10. The abdominal pain is relieved by nothing. The abdominal pain is exacerbated by eating (ate chick fila before it started).  Nausea began today. The nausea is associated with eating. The nausea is exacerbated by food.  Associated medical issues comments: pancreatitis.    Past Medical History  Diagnosis Date  . Pancreatitis   . Pancreatitis   . Kidney stones     Past Surgical History  Procedure Date  . Appendectomy   . Cholecystectomy   . Kidney stone surgery     History reviewed. No pertinent family history.  History  Substance Use Topics  . Smoking status: Current Every Day Smoker -- 0.5 packs/day    Types: Cigarettes  . Smokeless tobacco: Not on file  . Alcohol Use: No      Review of Systems  Constitutional: Negative for fever and fatigue.  Respiratory: Negative for shortness of breath.   Cardiovascular: Negative for chest pain.  Gastrointestinal: Positive for nausea and abdominal pain. Negative for vomiting and diarrhea.  All other systems reviewed and are negative.    Allergies    Cortisone; Eggs or egg-derived products; and Ketorolac tromethamine  Home Medications   Current Outpatient Rx  Name  Route  Sig  Dispense  Refill  . FENTANYL 25 MCG/HR TD PT72   Transdermal   Place 1 patch onto the skin every 3 (three) days.         Marland Kitchen ONDANSETRON 8 MG PO TBDP      8mg  ODT q8 hours prn nausea   4 tablet   0   . OXYCODONE-ACETAMINOPHEN 5-325 MG PO TABS   Oral   Take 2 tablets by mouth every 4 (four) hours as needed for pain.   15 tablet   0   . OXYCODONE-ACETAMINOPHEN 5-325 MG PO TABS   Oral   Take 1 tablet by mouth every 6 (six) hours as needed for pain.   10 tablet   0   . PANTOPRAZOLE SODIUM 40 MG PO TBEC   Oral   Take 1 tablet (40 mg total) by mouth daily.   14 tablet   0   . PROMETHAZINE HCL 25 MG PO TABS   Oral   Take 1 tablet (25 mg total) by mouth every 6 (six) hours as needed for nausea.   15 tablet   0     BP 182/102  Pulse 100  Temp 98.5 F (36.9 C) (Oral)  Resp 16  Ht 6\' 1"  (1.854 m)  Wt 200 lb (90.719 kg)  BMI 26.39 kg/m2  SpO2 100%  Physical Exam  Constitutional: He is oriented to  person, place, and time. He appears well-developed and well-nourished. No distress.  HENT:  Head: Normocephalic and atraumatic.  Mouth/Throat: Oropharynx is clear and moist.  Eyes: Conjunctivae normal are normal. Pupils are equal, round, and reactive to light.  Neck: Normal range of motion. Neck supple.  Cardiovascular: Normal rate and regular rhythm.   Pulmonary/Chest: Effort normal and breath sounds normal. He has no wheezes. He has no rales.  Abdominal: Soft. Bowel sounds are normal. He exhibits no distension. There is no rebound and no guarding.       Mild ruq tenderness, normal bowel sounds in all 4 quadrants  Musculoskeletal: Normal range of motion. He exhibits no edema.  Neurological: He is alert and oriented to person, place, and time.  Skin: Skin is warm and dry.  Psychiatric: He has a normal mood and affect.    ED Course   Procedures (including critical care time)  Labs Reviewed  CBC WITH DIFFERENTIAL - Abnormal; Notable for the following:    MCHC 36.8 (*)     All other components within normal limits  COMPREHENSIVE METABOLIC PANEL - Abnormal; Notable for the following:    Glucose, Bld 100 (*)     All other components within normal limits  LIPASE, BLOOD - Abnormal; Notable for the following:    Lipase 63 (*)     All other components within normal limits  URINALYSIS, ROUTINE W REFLEX MICROSCOPIC   No results found.   1. Chronic pancreatitis       MDM  Exam, labs and vitals reassuring.  No indication for imaging.  Will provide rx for pain meds and zofran.  Instructions for bland diet to follow.  Recent normal CT scan.  Patient told to follow up with his GI doctors, return immediately for worsening pain, stiff abdomen, inability to pass gas or hold down food, fevers > 101 or any concerns.  Patient verbalizes understanding and agrees to follow up         Shaunna Rosetti Smitty Cords, MD 04/15/12 [phone]

## 2012-04-15 NOTE — ED Notes (Signed)
Pt c/o abd pain , hx chronic pancreatitis

## 2012-04-15 NOTE — ED Notes (Signed)
MD at bedside. 

## 2012-04-15 NOTE — ED Notes (Signed)
Pt given water to drink per Dr. Nicanor Alcon. Pt sts his wife will be here in apprx. 30 minutes.

## 2012-05-07 ENCOUNTER — Encounter (HOSPITAL_BASED_OUTPATIENT_CLINIC_OR_DEPARTMENT_OTHER): Payer: Self-pay | Admitting: *Deleted

## 2012-05-07 ENCOUNTER — Emergency Department (HOSPITAL_BASED_OUTPATIENT_CLINIC_OR_DEPARTMENT_OTHER)
Admission: EM | Admit: 2012-05-07 | Discharge: 2012-05-07 | Disposition: A | Discharge status: Home / Self Care | Payer: BC Managed Care – PPO | Attending: Emergency Medicine | Admitting: Emergency Medicine

## 2012-05-07 DIAGNOSIS — Z87442 Personal history of urinary calculi: Secondary | ICD-10-CM | POA: Insufficient documentation

## 2012-05-07 DIAGNOSIS — R51 Headache: Secondary | ICD-10-CM | POA: Insufficient documentation

## 2012-05-07 DIAGNOSIS — R112 Nausea with vomiting, unspecified: Secondary | ICD-10-CM | POA: Insufficient documentation

## 2012-05-07 DIAGNOSIS — Z79899 Other long term (current) drug therapy: Secondary | ICD-10-CM | POA: Insufficient documentation

## 2012-05-07 DIAGNOSIS — K861 Other chronic pancreatitis: Secondary | ICD-10-CM

## 2012-05-07 DIAGNOSIS — F17200 Nicotine dependence, unspecified, uncomplicated: Secondary | ICD-10-CM | POA: Insufficient documentation

## 2012-05-07 LAB — URINALYSIS, ROUTINE W REFLEX MICROSCOPIC
Ketones, ur: NEGATIVE mg/dL
Leukocytes, UA: NEGATIVE
Nitrite: NEGATIVE
Protein, ur: NEGATIVE mg/dL
Urobilinogen, UA: 0.2 mg/dL (ref 0.0–1.0)

## 2012-05-07 LAB — COMPREHENSIVE METABOLIC PANEL
Albumin: 4.4 g/dL (ref 3.5–5.2)
BUN: 11 mg/dL (ref 6–23)
CO2: 26 mEq/L (ref 19–32)
Chloride: 99 mEq/L (ref 96–112)
Creatinine, Ser: 0.9 mg/dL (ref 0.50–1.35)
GFR calc Af Amer: >90 mL/min (ref >90)
GFR calc non Af Amer: >90 mL/min (ref >90)
Total Bilirubin: 0.3 mg/dL (ref 0.3–1.2)

## 2012-05-07 LAB — CBC WITH DIFFERENTIAL/PLATELET
Basophils Absolute: 0.1 10*3/uL (ref 0.0–0.1)
HCT: 38.9 % — ABNORMAL LOW (ref 39.0–52.0)
Lymphs Abs: 3.7 10*3/uL (ref 0.7–4.0)
MCHC: 36.8 g/dL — ABNORMAL HIGH (ref 30.0–36.0)
MCV: 83.8 fL (ref 78.0–100.0)
Monocytes Relative: 12 % (ref 3–12)
Neutro Abs: 4.2 10*3/uL (ref 1.7–7.7)
RDW: 12.3 % (ref 11.5–15.5)
WBC: 9.5 10*3/uL (ref 4.0–10.5)

## 2012-05-07 LAB — LIPASE, BLOOD: Lipase: 36 U/L (ref 11–59)

## 2012-05-07 MED ORDER — HYDROMORPHONE HCL PF 1 MG/ML IJ SOLN
1.0000 mg | Freq: Once | INTRAMUSCULAR | Status: AC
  Administered 2012-05-07: 1 mg via INTRAVENOUS
  Filled 2012-05-07: qty 1

## 2012-05-07 MED ORDER — MORPHINE SULFATE 4 MG/ML IJ SOLN
4.0000 mg | Freq: Once | INTRAMUSCULAR | Status: AC
  Administered 2012-05-07: 4 mg via INTRAVENOUS
  Filled 2012-05-07: qty 1

## 2012-05-07 MED ORDER — SODIUM CHLORIDE 0.9 % IV BOLUS (SEPSIS)
1000.0000 mL | Freq: Once | INTRAVENOUS | Status: AC
  Administered 2012-05-07: 1000 mL via INTRAVENOUS

## 2012-05-07 MED ORDER — ONDANSETRON HCL 4 MG/2ML IJ SOLN
4.0000 mg | Freq: Once | INTRAMUSCULAR | Status: AC
  Administered 2012-05-07: 4 mg via INTRAVENOUS
  Filled 2012-05-07: qty 2

## 2012-05-07 NOTE — ED Provider Notes (Signed)
History     CSN: [id number]  Arrival date & time 05/07/12  6962   First MD Initiated Contact with Patient 05/07/12 [phone]      Chief Complaint  Patient presents with  . Abdominal Pain  . Emesis    (Consider location/radiation/quality/duration/timing/severity/associated sxs/prior treatment) HPI Comments: Patient presents with upper abdominal pain associated with nausea and vomiting that started about 45 minutes prior to arrival. He has a history of chronic recurrent pancreatitis and states this feels similar to his past episodes. He was previously seen a GI doctor in [place] is recently started going to [facility]. They are talking about possibly putting a stent into pancreatic duct. He has a constant burning pain in his upper abdomen. Denies any blood in his emesis. Denies any fevers or chills. He states he hasn't eaten anything unusual. The last thing that he ate was an orange about 3 AM.  Patient is a 36 y.o. male presenting with abdominal pain and vomiting.  Abdominal Pain The primary symptoms of the illness include abdominal pain, nausea and vomiting. The primary symptoms of the illness do not include fever, fatigue, shortness of breath or diarrhea.  Symptoms associated with the illness do not include chills, diaphoresis, hematuria, frequency or back pain.  Emesis  Associated symptoms include abdominal pain and headaches. Pertinent negatives include no arthralgias, no chills, no cough, no diarrhea and no fever.    Past Medical History  Diagnosis Date  . Pancreatitis   . Pancreatitis   . Kidney stones     Past Surgical History  Procedure Date  . Appendectomy   . Cholecystectomy   . Kidney stone surgery     History reviewed. No pertinent family history.  History  Substance Use Topics  . Smoking status: Current Every Day Smoker -- 0.5 packs/day    Types: Cigarettes  . Smokeless tobacco: Not on file  . Alcohol Use: No      Review of Systems  Constitutional:  Negative for fever, chills, diaphoresis and fatigue.  HENT: Negative for congestion, rhinorrhea and sneezing.   Eyes: Negative.   Respiratory: Negative for cough, chest tightness and shortness of breath.   Cardiovascular: Negative for chest pain and leg swelling.  Gastrointestinal: Positive for nausea, vomiting and abdominal pain. Negative for diarrhea and blood in stool.  Genitourinary: Negative for frequency, hematuria, flank pain and difficulty urinating.  Musculoskeletal: Negative for back pain and arthralgias.  Skin: Negative for rash.  Neurological: Positive for headaches. Negative for dizziness, speech difficulty, weakness and numbness.    Allergies  Cortisone; Eggs or egg-derived products; and Ketorolac tromethamine  Home Medications   Current Outpatient Rx  Name  Route  Sig  Dispense  Refill  . FENTANYL 25 MCG/HR TD PT72   Transdermal   Place 1 patch onto the skin every 3 (three) days.         Marland Kitchen ONDANSETRON 8 MG PO TBDP      8mg  ODT q8 hours prn nausea   4 tablet   0   . OXYCODONE-ACETAMINOPHEN 5-325 MG PO TABS   Oral   Take 2 tablets by mouth every 4 (four) hours as needed for pain.   15 tablet   0   . OXYCODONE-ACETAMINOPHEN 5-325 MG PO TABS   Oral   Take 1 tablet by mouth every 6 (six) hours as needed for pain.   10 tablet   0   . PANTOPRAZOLE SODIUM 40 MG PO TBEC   Oral   Take 1  tablet (40 mg total) by mouth daily.   14 tablet   0   . PROMETHAZINE HCL 25 MG PO TABS   Oral   Take 1 tablet (25 mg total) by mouth every 6 (six) hours as needed for nausea.   15 tablet   0     BP 128/87  Pulse 104  Temp 98.7 F (37.1 C) (Oral)  Resp 18  Ht 6\' 1"  (1.854 m)  Wt 210 lb (95.255 kg)  BMI 27.71 kg/m2  SpO2 96%  Physical Exam  Constitutional: He is oriented to person, place, and time. He appears well-developed and well-nourished.  HENT:  Head: Normocephalic and atraumatic.  Eyes: Pupils are equal, round, and reactive to light.  Neck: Normal  range of motion. Neck supple.  Cardiovascular: Normal rate, regular rhythm and normal heart sounds.   Pulmonary/Chest: Effort normal and breath sounds normal. No respiratory distress. He has no wheezes. He has no rales. He exhibits no tenderness.  Abdominal: Soft. Bowel sounds are normal. There is tenderness (Moderate tenderness across upper abdomen). There is no rebound and no guarding.  Musculoskeletal: Normal range of motion. He exhibits no edema.  Lymphadenopathy:    He has no cervical adenopathy.  Neurological: He is alert and oriented to person, place, and time.  Skin: Skin is warm and dry. No rash noted.  Psychiatric: He has a normal mood and affect.    ED Course  Procedures (including critical care time)  Results for orders placed during the hospital encounter of 05/07/12  CBC WITH DIFFERENTIAL      Component Value Range   WBC 9.5  4.0 - 10.5 K/uL   RBC 4.64  4.22 - 5.81 MIL/uL   Hemoglobin 14.3  13.0 - 17.0 g/dL   HCT 16.1 (*) 09.6 - 04.5 %   MCV 83.8  78.0 - 100.0 fL   MCH 30.8  26.0 - 34.0 pg   MCHC 36.8 (*) 30.0 - 36.0 g/dL   RDW 40.9  81.1 - 91.4 %   Platelets 214  150 - 400 K/uL   Neutrophils Relative 44  43 - 77 %   Lymphocytes Relative 39  12 - 46 %   Monocytes Relative 12  3 - 12 %   Eosinophils Relative 4  0 - 5 %   Basophils Relative 1  0 - 1 %   Neutro Abs 4.2  1.7 - 7.7 K/uL   Lymphs Abs 3.7  0.7 - 4.0 K/uL   Monocytes Absolute 1.1 (*) 0.1 - 1.0 K/uL   Eosinophils Absolute 0.4  0.0 - 0.7 K/uL   Basophils Absolute 0.1  0.0 - 0.1 K/uL   Smear Review MORPHOLOGY UNREMARKABLE    COMPREHENSIVE METABOLIC PANEL      Component Value Range   Sodium 137  135 - 145 mEq/L   Potassium 3.0 (*) 3.5 - 5.1 mEq/L   Chloride 99  96 - 112 mEq/L   CO2 26  19 - 32 mEq/L   Glucose, Bld 118 (*) 70 - 99 mg/dL   BUN 11  6 - 23 mg/dL   Creatinine, Ser 7.82  0.50 - 1.35 mg/dL   Calcium 95.6  8.4 - 21.3 mg/dL   Total Protein 7.8  6.0 - 8.3 g/dL   Albumin 4.4  3.5 - 5.2 g/dL    AST 27  0 - 37 U/L   ALT 28  0 - 53 U/L   Alkaline Phosphatase 78  39 - 117 U/L   Total  Bilirubin 0.3  0.3 - 1.2 mg/dL   GFR calc non Af Amer >90  >90 mL/min   GFR calc Af Amer >90  >90 mL/min  LIPASE, BLOOD      Component Value Range   Lipase 36  11 - 59 U/L  URINALYSIS, ROUTINE W REFLEX MICROSCOPIC      Component Value Range   Color, Urine YELLOW  YELLOW   APPearance CLOUDY (*) CLEAR   Specific Gravity, Urine 1.016  1.005 - 1.030   pH 7.5  5.0 - 8.0   Glucose, UA NEGATIVE  NEGATIVE mg/dL   Hgb urine dipstick NEGATIVE  NEGATIVE   Bilirubin Urine NEGATIVE  NEGATIVE   Ketones, ur NEGATIVE  NEGATIVE mg/dL   Protein, ur NEGATIVE  NEGATIVE mg/dL   Urobilinogen, UA 0.2  0.0 - 1.0 mg/dL   Nitrite NEGATIVE  NEGATIVE   Leukocytes, UA NEGATIVE  NEGATIVE   No results found.    1. Chronic pancreatitis       MDM  Patient is feeling much better after IV fluids and analgesics. His symptoms are consistent with his past episodes of pancreatitis. His labs are normal. He's had a recent CT scan which was unremarkable. He was discharged in good condition and will followup with his GI doctor in [place].        Rolan Bucco, MD 05/07/12 1027

## 2012-05-07 NOTE — ED Notes (Signed)
Pt states he developed N/V and abdominal pain this AM. Denies fever or chills.

## 2012-07-15 ENCOUNTER — Emergency Department (HOSPITAL_BASED_OUTPATIENT_CLINIC_OR_DEPARTMENT_OTHER)
Admission: EM | Admit: 2012-07-15 | Discharge: 2012-07-15 | Disposition: A | Discharge status: Home / Self Care | Payer: BC Managed Care – PPO | Attending: Emergency Medicine | Admitting: Emergency Medicine

## 2012-07-15 ENCOUNTER — Encounter (HOSPITAL_BASED_OUTPATIENT_CLINIC_OR_DEPARTMENT_OTHER): Payer: Self-pay | Admitting: *Deleted

## 2012-07-15 DIAGNOSIS — K859 Acute pancreatitis without necrosis or infection, unspecified: Secondary | ICD-10-CM

## 2012-07-15 DIAGNOSIS — F17200 Nicotine dependence, unspecified, uncomplicated: Secondary | ICD-10-CM | POA: Insufficient documentation

## 2012-07-15 DIAGNOSIS — Z79899 Other long term (current) drug therapy: Secondary | ICD-10-CM | POA: Insufficient documentation

## 2012-07-15 DIAGNOSIS — R112 Nausea with vomiting, unspecified: Secondary | ICD-10-CM | POA: Insufficient documentation

## 2012-07-15 DIAGNOSIS — Z87442 Personal history of urinary calculi: Secondary | ICD-10-CM | POA: Insufficient documentation

## 2012-07-15 LAB — COMPREHENSIVE METABOLIC PANEL
Albumin: 4.3 g/dL (ref 3.5–5.2)
BUN: 15 mg/dL (ref 6–23)
Creatinine, Ser: 0.9 mg/dL (ref 0.50–1.35)
Total Protein: 7.2 g/dL (ref 6.0–8.3)

## 2012-07-15 LAB — CBC WITH DIFFERENTIAL/PLATELET
Eosinophils Relative: 3 % (ref 0–5)
HCT: 41.9 % (ref 39.0–52.0)
Lymphocytes Relative: 31 % (ref 12–46)
Lymphs Abs: 2.6 10*3/uL (ref 0.7–4.0)
MCV: 83.8 fL (ref 78.0–100.0)
Monocytes Absolute: 0.8 10*3/uL (ref 0.1–1.0)
Neutro Abs: 4.8 10*3/uL (ref 1.7–7.7)
Platelets: 209 10*3/uL (ref 150–400)
RBC: 5 MIL/uL (ref 4.22–5.81)
WBC: 8.6 10*3/uL (ref 4.0–10.5)

## 2012-07-15 MED ORDER — SODIUM CHLORIDE 0.9 % IV SOLN: INTRAVENOUS | Status: DC

## 2012-07-15 MED ORDER — PROMETHAZINE HCL 25 MG PO TABS: 25.0000 mg | ORAL_TABLET | Freq: Four times a day (QID) | ORAL | Status: DC | PRN

## 2012-07-15 MED ORDER — HYDROMORPHONE HCL PF 1 MG/ML IJ SOLN
1.0000 mg | Freq: Once | INTRAMUSCULAR | Status: AC
  Administered 2012-07-15: 1 mg via INTRAVENOUS
  Filled 2012-07-15: qty 1

## 2012-07-15 MED ORDER — OXYCODONE-ACETAMINOPHEN 5-325 MG PO TABS: 1.0000 | ORAL_TABLET | Freq: Four times a day (QID) | ORAL | Status: DC | PRN

## 2012-07-15 MED ORDER — ONDANSETRON HCL 4 MG/2ML IJ SOLN
4.0000 mg | Freq: Once | INTRAMUSCULAR | Status: AC
  Administered 2012-07-15: 4 mg via INTRAVENOUS
  Filled 2012-07-15: qty 2

## 2012-07-15 MED ORDER — SODIUM CHLORIDE 0.9 % IV BOLUS (SEPSIS)
1000.0000 mL | Freq: Once | INTRAVENOUS | Status: AC
  Administered 2012-07-15: 1000 mL via INTRAVENOUS

## 2012-07-15 NOTE — ED Notes (Signed)
Patient stated having mid abd around 10:30 last night and started vomiting around 1:30 am, Hx pancreatitis. No diarrhes

## 2012-07-15 NOTE — ED Provider Notes (Signed)
History     CSN: [id number]  Arrival date & time 07/15/12  4098   First MD Initiated Contact with Patient 07/15/12 0710      Chief Complaint  Patient presents with  . Abdominal Pain    (Consider location/radiation/quality/duration/timing/severity/associated sxs/prior treatment) Patient is a 37 y.o. male presenting with abdominal pain. The history is provided by the patient.  Abdominal Pain Pain location:  Epigastric Pain quality: aching and sharp   Pain radiates to:  Does not radiate Pain severity:  Severe Onset quality:  Gradual Duration:  6 hours Timing:  Constant Progression:  Worsening Chronicity:  Recurrent Relieved by:  Nothing Associated symptoms: nausea and vomiting   Associated symptoms: no chest pain, no diarrhea, no dysuria, no fever and no shortness of breath    Patient with history of chronic recurrent pancreatitis. Location of pain is similar to his pancreatitis. Onset was about 1:30 in the morning it was preceded by nausea he has vomited 4 times. Pain is located right upper quadrant and epigastric area nonradiating patient states pain is 10 out of 10. Patient last seen for same in December. Review of that visit showed similar complaint labs ended up being normal patient improved with fluids in the pain medications.  Past Medical History  Diagnosis Date  . Pancreatitis   . Pancreatitis   . Kidney stones     Past Surgical History  Procedure Laterality Date  . Appendectomy    . Cholecystectomy    . Kidney stone surgery      No family history on file.  History  Substance Use Topics  . Smoking status: Current Every Day Smoker -- 0.50 packs/day    Types: Cigarettes  . Smokeless tobacco: Not on file  . Alcohol Use: No      Review of Systems  Constitutional: Negative for fever.  HENT: Negative for congestion.   Eyes: Negative for redness.  Respiratory: Negative for shortness of breath.   Cardiovascular: Negative for chest pain.  Gastrointestinal:  Positive for nausea, vomiting and abdominal pain. Negative for diarrhea.  Genitourinary: Negative for dysuria.  Musculoskeletal: Negative for back pain.  Skin: Negative for rash.  Neurological: Negative for headaches.  Hematological: Does not bruise/bleed easily.    Allergies  Cortisone; Eggs or egg-derived products; and Ketorolac tromethamine  Home Medications   Current Outpatient Rx  Name  Route  Sig  Dispense  Refill  . fentaNYL (DURAGESIC - DOSED MCG/HR) 25 MCG/HR   Transdermal   Place 1 patch onto the skin every 3 (three) days.         . ondansetron (ZOFRAN ODT) 8 MG disintegrating tablet      8mg  ODT q8 hours prn nausea   4 tablet   0   . oxyCODONE-acetaminophen (PERCOCET) 5-325 MG per tablet   Oral   Take 2 tablets by mouth every 4 (four) hours as needed for pain.   15 tablet   0   . oxyCODONE-acetaminophen (PERCOCET) 5-325 MG per tablet   Oral   Take 1 tablet by mouth every 6 (six) hours as needed for pain.   10 tablet   0   . oxyCODONE-acetaminophen (PERCOCET/ROXICET) 5-325 MG per tablet   Oral   Take 1-2 tablets by mouth every 6 (six) hours as needed for pain.   20 tablet   0   . pantoprazole (PROTONIX) 40 MG tablet   Oral   Take 1 tablet (40 mg total) by mouth daily.   14 tablet   0   .  promethazine (PHENERGAN) 25 MG tablet   Oral   Take 1 tablet (25 mg total) by mouth every 6 (six) hours as needed for nausea.   15 tablet   0   . promethazine (PHENERGAN) 25 MG tablet   Oral   Take 1 tablet (25 mg total) by mouth every 6 (six) hours as needed for nausea.   20 tablet   0     BP 170/126  Pulse 94  Temp(Src) 98.4 F (36.9 C) (Oral)  Resp 22  SpO2 100%  Physical Exam  Nursing note and vitals reviewed. Constitutional: He is oriented to person, place, and time. He appears well-developed and well-nourished. He appears distressed.  HENT:  Head: Normocephalic and atraumatic.  Mouth/Throat: Oropharynx is clear and moist.  Eyes:  Conjunctivae and EOM are normal. Pupils are equal, round, and reactive to light.  Neck: Normal range of motion.  Cardiovascular: Normal rate, regular rhythm and normal heart sounds.   No murmur heard. Pulmonary/Chest: Effort normal and breath sounds normal. No respiratory distress.  Abdominal: Soft. Bowel sounds are normal. There is no tenderness.  Musculoskeletal: Normal range of motion. He exhibits no edema.  Neurological: He is alert and oriented to person, place, and time. No cranial nerve deficit. He exhibits normal muscle tone. Coordination normal.  Skin: Skin is warm.    ED Course  Procedures (including critical care time)  Labs Reviewed  LIPASE, BLOOD - Abnormal; Notable for the following:    Lipase 60 (*)    All other components within normal limits  COMPREHENSIVE METABOLIC PANEL - Abnormal; Notable for the following:    Glucose, Bld 105 (*)    All other components within normal limits  CBC WITH DIFFERENTIAL - Abnormal; Notable for the following:    MCHC 36.5 (*)    All other components within normal limits   No results found. Results for orders placed during the hospital encounter of 07/15/12  LIPASE, BLOOD      Result Value Range   Lipase 60 (*) 11 - 59 U/L  COMPREHENSIVE METABOLIC PANEL      Result Value Range   Sodium 139  135 - 145 mEq/L   Potassium 3.7  3.5 - 5.1 mEq/L   Chloride 104  96 - 112 mEq/L   CO2 24  19 - 32 mEq/L   Glucose, Bld 105 (*) 70 - 99 mg/dL   BUN 15  6 - 23 mg/dL   Creatinine, Ser 1.61  0.50 - 1.35 mg/dL   Calcium 09.6  8.4 - 04.5 mg/dL   Total Protein 7.2  6.0 - 8.3 g/dL   Albumin 4.3  3.5 - 5.2 g/dL   AST 19  0 - 37 U/L   ALT 19  0 - 53 U/L   Alkaline Phosphatase 69  39 - 117 U/L   Total Bilirubin 0.3  0.3 - 1.2 mg/dL   GFR calc non Af Amer >90  >90 mL/min   GFR calc Af Amer >90  >90 mL/min  CBC WITH DIFFERENTIAL      Result Value Range   WBC 8.6  4.0 - 10.5 K/uL   RBC 5.00  4.22 - 5.81 MIL/uL   Hemoglobin 15.3  13.0 - 17.0 g/dL    HCT 40.9  81.1 - 91.4 %   MCV 83.8  78.0 - 100.0 fL   MCH 30.6  26.0 - 34.0 pg   MCHC 36.5 (*) 30.0 - 36.0 g/dL   RDW 78.2  95.6 - 21.3 %  Platelets 209  150 - 400 K/uL   Neutrophils Relative 56  43 - 77 %   Neutro Abs 4.8  1.7 - 7.7 K/uL   Lymphocytes Relative 31  12 - 46 %   Lymphs Abs 2.6  0.7 - 4.0 K/uL   Monocytes Relative 9  3 - 12 %   Monocytes Absolute 0.8  0.1 - 1.0 K/uL   Eosinophils Relative 3  0 - 5 %   Eosinophils Absolute 0.3  0.0 - 0.7 K/uL   Basophils Relative 1  0 - 1 %   Basophils Absolute 0.1  0.0 - 0.1 K/uL     1. Pancreatitis       MDM  Lab findings consistent with pancreatitis fits his history of recurrent chronic pancreatitis. Patient improved with pain medication and fluids in the emergency department.        Shelda Jakes, MD 07/15/12 [phone]

## 2012-08-10 ENCOUNTER — Emergency Department (HOSPITAL_BASED_OUTPATIENT_CLINIC_OR_DEPARTMENT_OTHER)
Admission: EM | Admit: 2012-08-10 | Discharge: 2012-08-10 | Disposition: A | Discharge status: Home / Self Care | Payer: BC Managed Care – PPO | Attending: Emergency Medicine | Admitting: Emergency Medicine

## 2012-08-10 ENCOUNTER — Encounter (HOSPITAL_BASED_OUTPATIENT_CLINIC_OR_DEPARTMENT_OTHER): Payer: Self-pay | Admitting: Emergency Medicine

## 2012-08-10 DIAGNOSIS — R1013 Epigastric pain: Secondary | ICD-10-CM | POA: Insufficient documentation

## 2012-08-10 DIAGNOSIS — Z87442 Personal history of urinary calculi: Secondary | ICD-10-CM | POA: Insufficient documentation

## 2012-08-10 DIAGNOSIS — Z8719 Personal history of other diseases of the digestive system: Secondary | ICD-10-CM | POA: Insufficient documentation

## 2012-08-10 DIAGNOSIS — R110 Nausea: Secondary | ICD-10-CM | POA: Insufficient documentation

## 2012-08-10 DIAGNOSIS — Z9089 Acquired absence of other organs: Secondary | ICD-10-CM | POA: Insufficient documentation

## 2012-08-10 DIAGNOSIS — F17200 Nicotine dependence, unspecified, uncomplicated: Secondary | ICD-10-CM | POA: Insufficient documentation

## 2012-08-10 DIAGNOSIS — G8929 Other chronic pain: Secondary | ICD-10-CM | POA: Insufficient documentation

## 2012-08-10 MED ORDER — HYDROMORPHONE HCL PF 1 MG/ML IJ SOLN
1.0000 mg | Freq: Once | INTRAMUSCULAR | Status: AC
  Administered 2012-08-10: 1 mg via INTRAVENOUS
  Filled 2012-08-10: qty 1

## 2012-08-10 MED ORDER — ONDANSETRON HCL 4 MG/2ML IJ SOLN
4.0000 mg | Freq: Once | INTRAMUSCULAR | Status: AC
  Administered 2012-08-10: 4 mg via INTRAVENOUS
  Filled 2012-08-10: qty 2

## 2012-08-10 NOTE — ED Provider Notes (Signed)
History     CSN: [id number]  Arrival date & time 08/10/12  1478   First MD Initiated Contact with Patient 08/10/12 0830      Chief Complaint  Patient presents with  . Abdominal Pain    (Consider location/radiation/quality/duration/timing/severity/associated sxs/prior treatment) Patient is a 37 y.o. male presenting with abdominal pain.  Abdominal Pain  Pt with chronic abdominal pain has numerous previous ED visits and is followed by pain clinic in [place] where he gets Rx for fentanyl patches and Nucynta. States he ran out of his Nucynta 2 days ago. This is approximately 5 days sooner than he should have. He states his pill bottle was short several pills. He reports onset of severe aching epigastric pain radiating into his back associated with nausea but no vomiting and no fever.   Past Medical History  Diagnosis Date  . Pancreatitis   . Pancreatitis   . Kidney stones     Past Surgical History  Procedure Laterality Date  . Appendectomy    . Cholecystectomy    . Kidney stone surgery    . Ercp w/ metal stent placement      No family history on file.  History  Substance Use Topics  . Smoking status: Current Every Day Smoker -- 0.50 packs/day    Types: Cigarettes  . Smokeless tobacco: Not on file  . Alcohol Use: No      Review of Systems  Gastrointestinal: Positive for abdominal pain.   All other systems reviewed and are negative except as noted in HPI.   Allergies  Cortisone; Eggs or egg-derived products; and Ketorolac tromethamine  Home Medications   Current Outpatient Rx  Name  Route  Sig  Dispense  Refill  . fentaNYL (DURAGESIC - DOSED MCG/HR) 25 MCG/HR   Transdermal   Place 1 patch onto the skin every 3 (three) days.           There were no vitals taken for this visit.  Physical Exam  Nursing note and vitals reviewed. Constitutional: He is oriented to person, place, and time. He appears well-developed and well-nourished.  HENT:  Head:  Normocephalic and atraumatic.  Eyes: EOM are normal. Pupils are equal, round, and reactive to light.  Neck: Normal range of motion. Neck supple.  Cardiovascular: Normal rate, normal heart sounds and intact distal pulses.   Pulmonary/Chest: Effort normal and breath sounds normal.  Abdominal: Bowel sounds are normal. He exhibits no distension. There is tenderness (epigastric). There is no rebound and no guarding.  Musculoskeletal: Normal range of motion. He exhibits no edema and no tenderness.  Neurological: He is alert and oriented to person, place, and time. He has normal strength. No cranial nerve deficit or sensory deficit.  Skin: Skin is warm and dry. No rash noted.  Psychiatric: He has a normal mood and affect.    ED Course  Procedures (including critical care time)  Labs Reviewed - No data to display No results found.   1. Chronic abdominal pain       MDM  Review of the records reveals numerous prior ED visits with chronic, mildly elevated lipase. Has never required admission for same. No indication for recheck of labs today. Reviewed the [place] controlled Substance database which reports the patient was given a full 30 day supply of his Nucynta on Feb 19. He was advised to followup with his pain clinic for long term pain control. One dose of medications in the ED.  Charles B. Bernette Mayers, MD 08/10/12 [phone]

## 2012-08-10 NOTE — ED Notes (Signed)
Pt having upper abdominal pain for 3 hours.  Radiating to back.  Pt c/o nausea.  No known fever.

## 2012-10-05 ENCOUNTER — Emergency Department (HOSPITAL_BASED_OUTPATIENT_CLINIC_OR_DEPARTMENT_OTHER): Payer: BC Managed Care – PPO

## 2012-10-05 ENCOUNTER — Emergency Department (HOSPITAL_BASED_OUTPATIENT_CLINIC_OR_DEPARTMENT_OTHER)
Admission: EM | Admit: 2012-10-05 | Discharge: 2012-10-05 | Disposition: A | Discharge status: Home / Self Care | Payer: BC Managed Care – PPO | Attending: Emergency Medicine | Admitting: Emergency Medicine

## 2012-10-05 ENCOUNTER — Encounter (HOSPITAL_BASED_OUTPATIENT_CLINIC_OR_DEPARTMENT_OTHER): Payer: Self-pay | Admitting: *Deleted

## 2012-10-05 DIAGNOSIS — R112 Nausea with vomiting, unspecified: Secondary | ICD-10-CM | POA: Insufficient documentation

## 2012-10-05 DIAGNOSIS — Z9089 Acquired absence of other organs: Secondary | ICD-10-CM | POA: Insufficient documentation

## 2012-10-05 DIAGNOSIS — Z8719 Personal history of other diseases of the digestive system: Secondary | ICD-10-CM | POA: Insufficient documentation

## 2012-10-05 DIAGNOSIS — Z87442 Personal history of urinary calculi: Secondary | ICD-10-CM | POA: Insufficient documentation

## 2012-10-05 DIAGNOSIS — R109 Unspecified abdominal pain: Secondary | ICD-10-CM

## 2012-10-05 DIAGNOSIS — F17200 Nicotine dependence, unspecified, uncomplicated: Secondary | ICD-10-CM | POA: Insufficient documentation

## 2012-10-05 LAB — CBC WITH DIFFERENTIAL/PLATELET
Basophils Absolute: 0 10*3/uL (ref 0.0–0.1)
Basophils Relative: 0 % (ref 0–1)
Eosinophils Relative: 1 % (ref 0–5)
HCT: 44.9 % (ref 39.0–52.0)
Hemoglobin: 16.6 g/dL (ref 13.0–17.0)
Lymphocytes Relative: 19 % (ref 12–46)
MCHC: 37 g/dL — ABNORMAL HIGH (ref 30.0–36.0)
MCV: 82.8 fL (ref 78.0–100.0)
Monocytes Absolute: 1.2 10*3/uL — ABNORMAL HIGH (ref 0.1–1.0)
Monocytes Relative: 12 % (ref 3–12)
RDW: 12.3 % (ref 11.5–15.5)

## 2012-10-05 LAB — BASIC METABOLIC PANEL
BUN: 14 mg/dL (ref 6–23)
CO2: 28 mEq/L (ref 19–32)
Calcium: 10.2 mg/dL (ref 8.4–10.5)
Chloride: 102 mEq/L (ref 96–112)
Creatinine, Ser: 0.9 mg/dL (ref 0.50–1.35)

## 2012-10-05 LAB — HEPATIC FUNCTION PANEL
ALT: 28 U/L (ref 0–53)
Alkaline Phosphatase: 67 U/L (ref 39–117)
Bilirubin, Direct: 0.1 mg/dL (ref 0.0–0.3)
Indirect Bilirubin: 0.4 mg/dL (ref 0.3–0.9)
Total Bilirubin: 0.5 mg/dL (ref 0.3–1.2)
Total Protein: 7.5 g/dL (ref 6.0–8.3)

## 2012-10-05 LAB — URINALYSIS, ROUTINE W REFLEX MICROSCOPIC
Glucose, UA: NEGATIVE mg/dL
Hgb urine dipstick: NEGATIVE
Ketones, ur: NEGATIVE mg/dL
Protein, ur: NEGATIVE mg/dL
pH: 6 (ref 5.0–8.0)

## 2012-10-05 LAB — URINE MICROSCOPIC-ADD ON

## 2012-10-05 LAB — LIPASE, BLOOD: Lipase: 49 U/L (ref 11–59)

## 2012-10-05 MED ORDER — ONDANSETRON HCL 4 MG/2ML IJ SOLN
4.0000 mg | Freq: Once | INTRAMUSCULAR | Status: AC
  Administered 2012-10-05: 4 mg via INTRAVENOUS
  Filled 2012-10-05: qty 2

## 2012-10-05 MED ORDER — HYDROMORPHONE HCL PF 1 MG/ML IJ SOLN
1.0000 mg | Freq: Once | INTRAMUSCULAR | Status: AC
  Administered 2012-10-05: 1 mg via INTRAVENOUS
  Filled 2012-10-05: qty 1

## 2012-10-05 MED ORDER — SODIUM CHLORIDE 0.9 % IV SOLN: Freq: Once | INTRAVENOUS | Status: DC

## 2012-10-05 MED ORDER — HYDROCODONE-ACETAMINOPHEN 5-325 MG PO TABS: 1.0000 | ORAL_TABLET | Freq: Four times a day (QID) | ORAL | Status: DC | PRN

## 2012-10-05 MED ORDER — SODIUM CHLORIDE 0.9 % IV BOLUS (SEPSIS)
1000.0000 mL | Freq: Once | INTRAVENOUS | Status: AC
  Administered 2012-10-05 (×2): 1000 mL via INTRAVENOUS

## 2012-10-05 NOTE — ED Provider Notes (Signed)
History    Scribed for Derwood Kaplan, MD, the patient was seen in room MH04/MH04. This chart was scribed by Lewanda Rife, ED scribe. Patient's care was started at 2000   CSN: [id number]  Arrival date & time 10/05/12  1903   First MD Initiated Contact with Patient 10/05/12 1951      Chief Complaint  Patient presents with  . Abdominal Pain    (Consider location/radiation/quality/duration/timing/severity/associated sxs/prior treatment) HPI HPI Comments: [name] is a 37 y.o. male who presents to the Emergency Department complaining of constant worsening right sided abdominal pain without radiation onset before lunch today. Reports emesis with 1 episode, and nausea. Denies fever, chills, sweats. Reports last bowel movement 9 pm last night. Denies any aggravating or alleviating factors. Denies hx of ulcers, gastritis. Reports hx of chronic pancreatitis due to ERCP abdominal surgery in 2006. Reports having a Fentanyl patch for pain and Nucynta with no relief of symptoms. Denies significant cardiac hx.  Past Medical History  Diagnosis Date  . Pancreatitis   . Pancreatitis   . Kidney stones     Past Surgical History  Procedure Laterality Date  . Appendectomy    . Cholecystectomy    . Kidney stone surgery    . Ercp w/ metal stent placement      History reviewed. No pertinent family history.  History  Substance Use Topics  . Smoking status: Current Every Day Smoker -- 0.50 packs/day    Types: Cigarettes  . Smokeless tobacco: Not on file  . Alcohol Use: No      Review of Systems  Constitutional: Negative for fever and chills.  Gastrointestinal: Positive for nausea, vomiting and abdominal pain.  All other systems reviewed and are negative.   A complete 10 system review of systems was obtained and all systems are negative except as noted in the HPI and PMH.    Allergies  Cortisone; Eggs or egg-derived products; and Ketorolac tromethamine  Home Medications    Current Outpatient Rx  Name  Route  Sig  Dispense  Refill  . tapentadol (NUCYNTA) 50 MG TABS   Oral   Take 75 mg by mouth.         . fentaNYL (DURAGESIC - DOSED MCG/HR) 25 MCG/HR   Transdermal   Place 1 patch onto the skin every 3 (three) days.           BP 134/94  Pulse 114  Temp(Src) 98.6 F (37 C) (Oral)  Resp 20  Ht 6\' 1"  (1.854 m)  Wt 195 lb (88.451 kg)  BMI 25.73 kg/m2  SpO2 95%  Physical Exam  Nursing note and vitals reviewed. Constitutional: He is oriented to person, place, and time. He appears well-developed and well-nourished. No distress.  HENT:  Head: Normocephalic and atraumatic.  Eyes: EOM are normal.  Neck: Neck supple. No tracheal deviation present.  Cardiovascular: Normal rate, regular rhythm and intact distal pulses.   Pulmonary/Chest: Effort normal. No respiratory distress.  Abdominal: Soft. Bowel sounds are normal. There is tenderness (RUQ and epigastric tenderness). There is guarding (voluntary ).  Musculoskeletal: Normal range of motion.  Neurological: He is alert and oriented to person, place, and time.  Skin: Skin is warm and dry.  Psychiatric: He has a normal mood and affect. His behavior is normal.    ED Course  Procedures (including critical care time) Medications - No data to display    Labs Reviewed  URINALYSIS, ROUTINE W REFLEX MICROSCOPIC - Abnormal; Notable for the following:  Leukocytes, UA TRACE (*)    All other components within normal limits  CBC WITH DIFFERENTIAL - Abnormal; Notable for the following:    MCHC 37.0 (*)    Monocytes Absolute 1.2 (*)    All other components within normal limits  URINE MICROSCOPIC-ADD ON  BASIC METABOLIC PANEL  HEPATIC FUNCTION PANEL  LIPASE, BLOOD   Dg Chest Port 1 View  10/05/2012  *RADIOLOGY REPORT*  Clinical Data: Pancreatitis.  Free air.  PORTABLE CHEST - 1 VIEW  Comparison: 05/20/2010.  Findings: The cardiopericardial silhouette is within normal limits. Basilar atelectasis.  No  free air underneath the hemidiaphragms. Mediastinal contours appear normal. No airspace disease or effusion.  IMPRESSION: Mild bilateral basilar atelectasis.   Original Report Authenticated By: Andreas Newport, M.D.      No diagnosis found.    MDM  I personally performed the services described in this documentation, which was scribed in my presence. The recorded information has been reviewed and is accurate.  DDx includes: Pancreatitis Hepatobiliary pathology including cholecystitis Gastritis/PUD SBO ACS syndrome Aortic Dissection   Pt comes in with cc of abd pain. Pt has hx of pancreatitis, and is s/p cholecystectomy. Comes in with pain that is similar to his pancreatitis. Imaging review shows 2 CT's last year- no pancreatitis. He has had at least 4 CT since 2013. Clinical exam not suggestive of any rebound or guarding, cxr shows no free air.  GI labs are WNL. Pt has severe pain, controlled by pain specialist. We will try to break pain here.     Derwood Kaplan, MD 10/05/12 2221

## 2012-10-05 NOTE — ED Notes (Addendum)
Pt states he has a hx of pancreatitis and this feels the same. Vomited this a.m. Nauseated now. EKG done at triage.

## 2013-02-11 ENCOUNTER — Emergency Department (HOSPITAL_BASED_OUTPATIENT_CLINIC_OR_DEPARTMENT_OTHER)
Admission: EM | Admit: 2013-02-11 | Discharge: 2013-02-11 | Disposition: A | Discharge status: Home / Self Care | Payer: BC Managed Care – PPO | Attending: Emergency Medicine | Admitting: Emergency Medicine

## 2013-02-11 ENCOUNTER — Encounter (HOSPITAL_BASED_OUTPATIENT_CLINIC_OR_DEPARTMENT_OTHER): Payer: Self-pay | Admitting: Emergency Medicine

## 2013-02-11 DIAGNOSIS — N200 Calculus of kidney: Secondary | ICD-10-CM | POA: Insufficient documentation

## 2013-02-11 DIAGNOSIS — F17200 Nicotine dependence, unspecified, uncomplicated: Secondary | ICD-10-CM | POA: Insufficient documentation

## 2013-02-11 DIAGNOSIS — R112 Nausea with vomiting, unspecified: Secondary | ICD-10-CM | POA: Insufficient documentation

## 2013-02-11 DIAGNOSIS — M549 Dorsalgia, unspecified: Secondary | ICD-10-CM | POA: Insufficient documentation

## 2013-02-11 DIAGNOSIS — Z8719 Personal history of other diseases of the digestive system: Secondary | ICD-10-CM | POA: Insufficient documentation

## 2013-02-11 LAB — CBC WITH DIFFERENTIAL/PLATELET
Basophils Relative: 0 % (ref 0–1)
Eosinophils Absolute: 0.2 10*3/uL (ref 0.0–0.7)
Eosinophils Relative: 1 % (ref 0–5)
HCT: 48.6 % (ref 39.0–52.0)
Hemoglobin: 17.6 g/dL — ABNORMAL HIGH (ref 13.0–17.0)
Lymphs Abs: 2.3 10*3/uL (ref 0.7–4.0)
MCH: 30.4 pg (ref 26.0–34.0)
MCHC: 36.2 g/dL — ABNORMAL HIGH (ref 30.0–36.0)
MCV: 83.9 fL (ref 78.0–100.0)
Monocytes Absolute: 1.5 10*3/uL — ABNORMAL HIGH (ref 0.1–1.0)
Neutro Abs: 11.2 10*3/uL — ABNORMAL HIGH (ref 1.7–7.7)
Neutrophils Relative %: 74 % (ref 43–77)
RBC: 5.79 MIL/uL (ref 4.22–5.81)

## 2013-02-11 LAB — URINALYSIS, ROUTINE W REFLEX MICROSCOPIC
Bilirubin Urine: NEGATIVE
Ketones, ur: NEGATIVE mg/dL
Nitrite: NEGATIVE
Specific Gravity, Urine: 1.013 (ref 1.005–1.030)
Urobilinogen, UA: 0.2 mg/dL (ref 0.0–1.0)

## 2013-02-11 LAB — URINE MICROSCOPIC-ADD ON

## 2013-02-11 LAB — COMPREHENSIVE METABOLIC PANEL
ALT: 43 U/L (ref 0–53)
BUN: 12 mg/dL (ref 6–23)
CO2: 24 mEq/L (ref 19–32)
Calcium: 11.2 mg/dL — ABNORMAL HIGH (ref 8.4–10.5)
GFR calc Af Amer: >90 mL/min (ref >90)
GFR calc non Af Amer: >90 mL/min (ref >90)
Glucose, Bld: 104 mg/dL — ABNORMAL HIGH (ref 70–99)
Sodium: 140 mEq/L (ref 135–145)
Total Protein: 8.1 g/dL (ref 6.0–8.3)

## 2013-02-11 MED ORDER — HYDROMORPHONE HCL PF 1 MG/ML IJ SOLN
1.0000 mg | Freq: Once | INTRAMUSCULAR | Status: AC
  Administered 2013-02-11: 1 mg via INTRAVENOUS
  Filled 2013-02-11: qty 1

## 2013-02-11 MED ORDER — OXYCODONE-ACETAMINOPHEN 5-325 MG PO TABS: 1.0000 | ORAL_TABLET | ORAL | Status: DC | PRN

## 2013-02-11 MED ORDER — HYDROMORPHONE HCL PF 1 MG/ML IJ SOLN
INTRAMUSCULAR | Status: AC
  Filled 2013-02-11: qty 1

## 2013-02-11 MED ORDER — HYDROMORPHONE HCL PF 1 MG/ML IJ SOLN
1.0000 mg | Freq: Once | INTRAMUSCULAR | Status: AC
  Administered 2013-02-11: 1 mg via INTRAVENOUS

## 2013-02-11 MED ORDER — ONDANSETRON HCL 4 MG/2ML IJ SOLN
4.0000 mg | Freq: Once | INTRAMUSCULAR | Status: AC
  Administered 2013-02-11: 4 mg via INTRAVENOUS
  Filled 2013-02-11: qty 2

## 2013-02-11 MED ORDER — TAMSULOSIN HCL 0.4 MG PO CAPS: 0.4000 mg | ORAL_CAPSULE | Freq: Every day | ORAL | Status: DC

## 2013-02-11 NOTE — ED Notes (Signed)
Patient ambulated to room. C/o "kidney stone" like pain. Pain radiates to back a groin area.

## 2013-02-11 NOTE — ED Provider Notes (Addendum)
CSN: [id number]     Arrival date & time 02/11/13  0931 History   First MD Initiated Contact with Patient 02/11/13 [phone]     Chief Complaint  Patient presents with  . Groin Pain  . Back Pain  . Nephrolithiasis   (Consider location/radiation/quality/duration/timing/severity/associated sxs/prior Treatment) Patient is a 37 y.o. male presenting with flank pain. The history is provided by the patient.  Flank Pain This is a new problem. The current episode started 1 to 2 hours ago. The problem occurs constantly. The problem has been rapidly worsening. Associated symptoms include abdominal pain. Pertinent negatives include no shortness of breath. Associated symptoms comments: Right flank pain that radiates to the groin with cloudy urine that feels like prior KS.  Pain is currently 10/10. Exacerbated by: urinating. Nothing relieves the symptoms. He has tried nothing for the symptoms. The treatment provided no relief.    Past Medical History  Diagnosis Date  . Pancreatitis   . Pancreatitis   . Kidney stones    Past Surgical History  Procedure Laterality Date  . Appendectomy    . Cholecystectomy    . Kidney stone surgery    . Ercp w/ metal stent placement     History reviewed. No pertinent family history. History  Substance Use Topics  . Smoking status: Current Every Day Smoker -- 0.50 packs/day    Types: Cigarettes  . Smokeless tobacco: Not on file  . Alcohol Use: Yes     Comment: rarely    Review of Systems  Constitutional: Negative for fever.  Respiratory: Negative for cough and shortness of breath.   Gastrointestinal: Positive for nausea, vomiting and abdominal pain.  Genitourinary: Positive for flank pain.  All other systems reviewed and are negative.    Allergies  Cortisone; Eggs or egg-derived products; and Ketorolac tromethamine  Home Medications   Current Outpatient Rx  Name  Route  Sig  Dispense  Refill  . fentaNYL (DURAGESIC - DOSED MCG/HR) 25 MCG/HR  Transdermal   Place 1 patch onto the skin every 3 (three) days.         Marland Kitchen HYDROcodone-acetaminophen (NORCO/VICODIN) 5-325 MG per tablet   Oral   Take 1 tablet by mouth every 6 (six) hours as needed for pain.   15 tablet   0   . tapentadol (NUCYNTA) 50 MG TABS   Oral   Take 75 mg by mouth.          BP 150/97  Pulse 76  Temp(Src) 97.7 F (36.5 C) (Oral)  Resp 18  Ht 6\' 1"  (1.854 m)  Wt 204 lb (92.534 kg)  BMI 26.92 kg/m2  SpO2 100% Physical Exam  Nursing note and vitals reviewed. Constitutional: He is oriented to person, place, and time. He appears well-developed and well-nourished. He appears distressed.  HENT:  Head: Normocephalic and atraumatic.  Mouth/Throat: Oropharynx is clear and moist.  Eyes: Conjunctivae and EOM are normal. Pupils are equal, round, and reactive to light.  Neck: Normal range of motion. Neck supple.  Cardiovascular: Normal rate, regular rhythm and intact distal pulses.   No murmur heard. Pulmonary/Chest: Effort normal and breath sounds normal. No respiratory distress. He has no wheezes. He has no rales.  Abdominal: Soft. He exhibits no distension. There is tenderness. There is CVA tenderness. There is no rebound and no guarding.  Right flank tenderness  Musculoskeletal: Normal range of motion. He exhibits no edema and no tenderness.  Neurological: He is alert and oriented to person, place, and time.  Skin:  Skin is warm and dry. No rash noted. No erythema.  Psychiatric: He has a normal mood and affect. His behavior is normal.    ED Course  Procedures (including critical care time) Labs Review Labs Reviewed  CBC WITH DIFFERENTIAL - Abnormal; Notable for the following:    WBC 15.2 (*)    Hemoglobin 17.6 (*)    MCHC 36.2 (*)    Neutro Abs 11.2 (*)    Monocytes Absolute 1.5 (*)    All other components within normal limits  COMPREHENSIVE METABOLIC PANEL - Abnormal; Notable for the following:    Potassium 3.4 (*)    Glucose, Bld 104 (*)     Calcium 11.2 (*)    All other components within normal limits  URINALYSIS, ROUTINE W REFLEX MICROSCOPIC - Abnormal; Notable for the following:    APPearance CLOUDY (*)    Hgb urine dipstick LARGE (*)    Leukocytes, UA TRACE (*)    All other components within normal limits  URINE MICROSCOPIC-ADD ON - Abnormal; Notable for the following:    Squamous Epithelial / LPF FEW (*)    Bacteria, UA MANY (*)    Crystals CA OXALATE CRYSTALS (*)    All other components within normal limits  URINE CULTURE   Imaging Review No results found.  MDM   1. Kidney stone on right side     Pt with symptoms consistent with kidney stone with prior hx.  Last KS was 1 year ago.  Denies infectious sx, or GI symptoms.  Low concern for diverticulitis and no risk factors or history suggestive of AAA.  No hx suggestive of GU source (discharge).  Patient does have a history of pancreatitis but states in length and feels exactly like his prior kidney stones.  Will hydrate, treat pain and ensure no infection with UA, CBC, CMP and will give pain medication  12:02 PM Pt feeling much better.  Evidence to suggest ks.  Will not scan today due to multiple scans in past.  Will give pain control and have f/u with urology.  Gwyneth Sprout, MD 02/11/13 1205  Gwyneth Sprout, MD 02/11/13 1208

## 2013-02-12 LAB — URINE CULTURE: Culture: NO GROWTH

## 2013-03-17 ENCOUNTER — Emergency Department (HOSPITAL_BASED_OUTPATIENT_CLINIC_OR_DEPARTMENT_OTHER)
Admission: EM | Admit: 2013-03-17 | Discharge: 2013-03-17 | Disposition: A | Discharge status: Home / Self Care | Payer: BC Managed Care – PPO | Attending: Emergency Medicine | Admitting: Emergency Medicine

## 2013-03-17 ENCOUNTER — Encounter (HOSPITAL_BASED_OUTPATIENT_CLINIC_OR_DEPARTMENT_OTHER): Payer: Self-pay | Admitting: Emergency Medicine

## 2013-03-17 DIAGNOSIS — Z87442 Personal history of urinary calculi: Secondary | ICD-10-CM | POA: Insufficient documentation

## 2013-03-17 DIAGNOSIS — F17200 Nicotine dependence, unspecified, uncomplicated: Secondary | ICD-10-CM | POA: Insufficient documentation

## 2013-03-17 DIAGNOSIS — G8929 Other chronic pain: Secondary | ICD-10-CM | POA: Insufficient documentation

## 2013-03-17 DIAGNOSIS — Z9889 Other specified postprocedural states: Secondary | ICD-10-CM | POA: Insufficient documentation

## 2013-03-17 DIAGNOSIS — Z9089 Acquired absence of other organs: Secondary | ICD-10-CM | POA: Insufficient documentation

## 2013-03-17 DIAGNOSIS — K859 Acute pancreatitis without necrosis or infection, unspecified: Secondary | ICD-10-CM | POA: Insufficient documentation

## 2013-03-17 LAB — COMPREHENSIVE METABOLIC PANEL
ALT: 19 U/L (ref 0–53)
AST: 20 U/L (ref 0–37)
Albumin: 4.4 g/dL (ref 3.5–5.2)
Calcium: 10.1 mg/dL (ref 8.4–10.5)
GFR calc Af Amer: >90 mL/min (ref >90)
Glucose, Bld: 113 mg/dL — ABNORMAL HIGH (ref 70–99)
Sodium: 141 mEq/L (ref 135–145)
Total Protein: 7.8 g/dL (ref 6.0–8.3)

## 2013-03-17 LAB — CBC WITH DIFFERENTIAL/PLATELET
Basophils Absolute: 0 10*3/uL (ref 0.0–0.1)
Basophils Relative: 0 % (ref 0–1)
Eosinophils Absolute: 0.3 10*3/uL (ref 0.0–0.7)
Eosinophils Relative: 3 % (ref 0–5)
MCH: 30.4 pg (ref 26.0–34.0)
MCV: 85.5 fL (ref 78.0–100.0)
Platelets: 208 10*3/uL (ref 150–400)
RDW: 12.5 % (ref 11.5–15.5)

## 2013-03-17 MED ORDER — ONDANSETRON 8 MG PO TBDP: ORAL_TABLET | ORAL | Status: DC

## 2013-03-17 MED ORDER — HYDROMORPHONE HCL PF 1 MG/ML IJ SOLN
1.0000 mg | Freq: Once | INTRAMUSCULAR | Status: AC
  Administered 2013-03-17: 1 mg via INTRAVENOUS
  Filled 2013-03-17: qty 1

## 2013-03-17 MED ORDER — ONDANSETRON HCL 4 MG/2ML IJ SOLN
4.0000 mg | Freq: Once | INTRAMUSCULAR | Status: AC
  Administered 2013-03-17: 4 mg via INTRAVENOUS
  Filled 2013-03-17: qty 2

## 2013-03-17 MED ORDER — HYDROMORPHONE HCL PF 1 MG/ML IJ SOLN: 1.0000 mg | Freq: Once | INTRAMUSCULAR | Status: DC

## 2013-03-17 MED ORDER — HYDROMORPHONE HCL PF 2 MG/ML IJ SOLN
2.0000 mg | Freq: Once | INTRAMUSCULAR | Status: AC
  Administered 2013-03-17: 2 mg via INTRAVENOUS
  Filled 2013-03-17: qty 1

## 2013-03-17 NOTE — ED Provider Notes (Signed)
CSN: [id number]     Arrival date & time 03/17/13  0226 History   First MD Initiated Contact with Patient 03/17/13 0258     Chief Complaint  Patient presents with  . Abdominal Pain   (Consider location/radiation/quality/duration/timing/severity/associated sxs/prior Treatment) Patient is a 37 y.o. male presenting with abdominal pain. The history is provided by the patient.  Abdominal Pain Pain location:  RUQ Pain quality: sharp   Pain radiates to:  Does not radiate Pain severity:  Severe Onset quality:  Sudden Duration:  1 hour Timing:  Constant Progression:  Unchanged Relieved by:  Nothing Worsened by:  Nothing tried Associated symptoms: vomiting   Associated symptoms: no anorexia   Risk factors: no alcohol abuse     Past Medical History  Diagnosis Date  . Pancreatitis   . Pancreatitis   . Kidney stones    Past Surgical History  Procedure Laterality Date  . Appendectomy    . Cholecystectomy    . Kidney stone surgery    . Ercp w/ metal stent placement     History reviewed. No pertinent family history. History  Substance Use Topics  . Smoking status: Current Every Day Smoker -- 0.50 packs/day    Types: Cigarettes  . Smokeless tobacco: Not on file  . Alcohol Use: Yes     Comment: rarely    Review of Systems  Gastrointestinal: Positive for vomiting and abdominal pain. Negative for anorexia.  All other systems reviewed and are negative.    Allergies  Cortisone; Eggs or egg-derived products; and Ketorolac tromethamine  Home Medications   Current Outpatient Rx  Name  Route  Sig  Dispense  Refill  . fentaNYL (DURAGESIC - DOSED MCG/HR) 25 MCG/HR   Transdermal   Place 1 patch onto the skin every 3 (three) days.         . tapentadol (NUCYNTA) 50 MG TABS   Oral   Take 75 mg by mouth.         Marland Kitchen HYDROcodone-acetaminophen (NORCO/VICODIN) 5-325 MG per tablet   Oral   Take 1 tablet by mouth every 6 (six) hours as needed for pain.   15 tablet   0   .  oxyCODONE-acetaminophen (PERCOCET/ROXICET) 5-325 MG per tablet   Oral   Take 1-2 tablets by mouth every 4 (four) hours as needed for pain.   15 tablet   0   . tamsulosin (FLOMAX) 0.4 MG CAPS capsule   Oral   Take 1 capsule (0.4 mg total) by mouth daily after supper.   5 capsule   0    BP 155/116  Pulse 106  Temp(Src) 98.1 F (36.7 C) (Oral)  Resp 20  Ht 6' (1.829 m)  Wt 205 lb (92.987 kg)  BMI 27.8 kg/m2  SpO2 100% Physical Exam  Constitutional: He is oriented to person, place, and time. He appears well-developed and well-nourished. No distress.  HENT:  Head: Normocephalic and atraumatic.  Mouth/Throat: Oropharynx is clear and moist.  Eyes: Conjunctivae are normal. Pupils are equal, round, and reactive to light.  Neck: Normal range of motion. Neck supple.  Cardiovascular: Normal rate and regular rhythm.   Pulmonary/Chest: Effort normal and breath sounds normal. He has no wheezes. He has no rales.  Abdominal: Soft. Bowel sounds are normal. There is no tenderness. There is no rebound and no guarding.  Musculoskeletal: Normal range of motion.  Neurological: He is alert and oriented to person, place, and time.  Skin: Skin is warm and dry.  Psychiatric: He has  a normal mood and affect.    ED Course  Procedures (including critical care time) Labs Review Labs Reviewed  CBC WITH DIFFERENTIAL - Abnormal; Notable for the following:    Monocytes Absolute 1.2 (*)    All other components within normal limits  COMPREHENSIVE METABOLIC PANEL  LIPASE, BLOOD   Imaging Review No results found.  EKG Interpretation   None       MDM  No diagnosis found. No vomiting during ED stay.  Has chronic pain.  No indication for imaging at this time.  No dehydration.  Patient is seen at pain management at [facility] will refer back to [facility] for titration of medication    Debbrah Sampedro K Hale Chalfin-Rasch, MD 03/17/13 [phone]

## 2013-03-17 NOTE — ED Notes (Signed)
Pt states that his wife is on the way to provide him a ride home, pt made aware that no meds for pain could be given until a ride was present.

## 2013-03-17 NOTE — ED Notes (Signed)
MD at bedside. 

## 2013-03-17 NOTE — ED Notes (Signed)
C/o right-sided abd pain x 1 hour with vomiting.

## 2013-06-05 ENCOUNTER — Encounter (HOSPITAL_BASED_OUTPATIENT_CLINIC_OR_DEPARTMENT_OTHER): Payer: Self-pay | Admitting: Emergency Medicine

## 2013-06-05 ENCOUNTER — Emergency Department (HOSPITAL_BASED_OUTPATIENT_CLINIC_OR_DEPARTMENT_OTHER)
Admission: EM | Admit: 2013-06-05 | Discharge: 2013-06-05 | Disposition: A | Discharge status: Home / Self Care | Payer: BC Managed Care – PPO | Attending: Emergency Medicine | Admitting: Emergency Medicine

## 2013-06-05 DIAGNOSIS — Z9889 Other specified postprocedural states: Secondary | ICD-10-CM | POA: Insufficient documentation

## 2013-06-05 DIAGNOSIS — F17200 Nicotine dependence, unspecified, uncomplicated: Secondary | ICD-10-CM | POA: Insufficient documentation

## 2013-06-05 DIAGNOSIS — Z87442 Personal history of urinary calculi: Secondary | ICD-10-CM | POA: Insufficient documentation

## 2013-06-05 DIAGNOSIS — Z9089 Acquired absence of other organs: Secondary | ICD-10-CM | POA: Insufficient documentation

## 2013-06-05 DIAGNOSIS — Z79899 Other long term (current) drug therapy: Secondary | ICD-10-CM | POA: Insufficient documentation

## 2013-06-05 DIAGNOSIS — K861 Other chronic pancreatitis: Secondary | ICD-10-CM

## 2013-06-05 LAB — URINALYSIS, ROUTINE W REFLEX MICROSCOPIC
Bilirubin Urine: NEGATIVE
GLUCOSE, UA: NEGATIVE mg/dL
Hgb urine dipstick: NEGATIVE
Ketones, ur: NEGATIVE mg/dL
Nitrite: NEGATIVE
PH: 6 (ref 5.0–8.0)
PROTEIN: NEGATIVE mg/dL
Specific Gravity, Urine: 1.016 (ref 1.005–1.030)
Urobilinogen, UA: 0.2 mg/dL (ref 0.0–1.0)

## 2013-06-05 LAB — URINE MICROSCOPIC-ADD ON

## 2013-06-05 LAB — CBC
HCT: 43.5 % (ref 39.0–52.0)
Hemoglobin: 15.5 g/dL (ref 13.0–17.0)
MCH: 30.7 pg (ref 26.0–34.0)
MCHC: 35.6 g/dL (ref 30.0–36.0)
MCV: 86.1 fL (ref 78.0–100.0)
Platelets: 299 10*3/uL (ref 150–400)
RBC: 5.05 MIL/uL (ref 4.22–5.81)
RDW: 12.7 % (ref 11.5–15.5)
WBC: 14 10*3/uL — ABNORMAL HIGH (ref 4.0–10.5)

## 2013-06-05 MED ORDER — HYDROMORPHONE HCL PF 2 MG/ML IJ SOLN
INTRAMUSCULAR | Status: AC
  Filled 2013-06-05: qty 1

## 2013-06-05 MED ORDER — ONDANSETRON HCL 4 MG/2ML IJ SOLN
4.0000 mg | Freq: Once | INTRAMUSCULAR | Status: AC
  Administered 2013-06-05: 4 mg via INTRAVENOUS
  Filled 2013-06-05: qty 2

## 2013-06-05 MED ORDER — FENTANYL CITRATE 0.05 MG/ML IJ SOLN
100.0000 ug | Freq: Once | INTRAMUSCULAR | Status: AC
  Administered 2013-06-05: 100 ug via INTRAVENOUS
  Filled 2013-06-05: qty 2

## 2013-06-05 MED ORDER — OXYCODONE-ACETAMINOPHEN 5-325 MG PO TABS: 1.0000 | ORAL_TABLET | ORAL | Status: DC | PRN

## 2013-06-05 MED ORDER — SODIUM CHLORIDE 0.9 % IV BOLUS (SEPSIS)
1000.0000 mL | Freq: Once | INTRAVENOUS | Status: AC
Start: 2013-06-05 — End: 2013-06-05
  Administered 2013-06-05: 1000 mL via INTRAVENOUS

## 2013-06-05 MED ORDER — HYDROMORPHONE HCL PF 2 MG/ML IJ SOLN
2.0000 mg | Freq: Once | INTRAMUSCULAR | Status: AC
  Administered 2013-06-05: 2 mg via INTRAVENOUS
  Filled 2013-06-05: qty 1

## 2013-06-05 NOTE — ED Notes (Signed)
Pt with right upper quad pain that awoke him around 0130am, pt reports h/o pancreatitis, states his pancreatic duct gets blocked a lot, last attack was in october

## 2013-06-05 NOTE — ED Provider Notes (Signed)
CSN: [id number]     Arrival date & time 06/05/13  0228 History   First MD Initiated Contact with Patient 06/05/13 0236     Chief Complaint  Patient presents with  . Abdominal Pain   (Consider location/radiation/quality/duration/timing/severity/associated sxs/prior Treatment) HPI Comments: 38 year old male, presents with a complaint of abdominal pain. This pain is located in the upper abdomen in the epigastrium and right upper quadrant, is severe, persistent, came on acutely at 1:30 AM and awoke him from sleep. He does have a history of chronic abdominal pain and chronic pancreatitis for which he takes daily medications for pain control. He feels as though this pain tonight is similar to acute episode of pancreatitis. He has not had any nausea and vomiting, his diet has been healthy and regular, he does not feel dehydrated. He does state that he has had some abnormal appearing stools recently, he endorses having any anal fissure which occasionally bleeds.  Medical record review shows that the patient had a CT scan of his abdomen and pelvis approximately 2 months ago, it was unremarkable at that time other than some nephrolithiasis without obstruction. His prior surgical history includes biliary tract manipulation and pancreatic duct stenting with subsequent infection and chronic pancreatitis since that time approximately 8 years ago.  Patient is a 38 y.o. male presenting with abdominal pain. The history is provided by the patient, the spouse and medical records.  Abdominal Pain   Past Medical History  Diagnosis Date  . Pancreatitis   . Pancreatitis   . Kidney stones    Past Surgical History  Procedure Laterality Date  . Appendectomy    . Cholecystectomy    . Kidney stone surgery    . Ercp w/ metal stent placement     History reviewed. No pertinent family history. History  Substance Use Topics  . Smoking status: Current Every Day Smoker -- 0.50 packs/day    Types: Cigarettes  . Smokeless  tobacco: Not on file  . Alcohol Use: Yes     Comment: rarely    Review of Systems  Gastrointestinal: Positive for abdominal pain.  All other systems reviewed and are negative.    Allergies  Cortisone; Eggs or egg-derived products; and Ketorolac tromethamine  Home Medications   Current Outpatient Rx  Name  Route  Sig  Dispense  Refill  . fentaNYL (DURAGESIC - DOSED MCG/HR) 25 MCG/HR   Transdermal   Place 1 patch onto the skin every 3 (three) days.         . tapentadol (NUCYNTA) 50 MG TABS   Oral   Take 75 mg by mouth.         Marland Kitchen HYDROcodone-acetaminophen (NORCO/VICODIN) 5-325 MG per tablet   Oral   Take 1 tablet by mouth every 6 (six) hours as needed for pain.   15 tablet   0   . ondansetron (ZOFRAN ODT) 8 MG disintegrating tablet      8mg  ODT q8 hours prn nausea   6 tablet   0   . oxyCODONE-acetaminophen (PERCOCET) 5-325 MG per tablet   Oral   Take 1 tablet by mouth every 4 (four) hours as needed.   20 tablet   0   . oxyCODONE-acetaminophen (PERCOCET/ROXICET) 5-325 MG per tablet   Oral   Take 1-2 tablets by mouth every 4 (four) hours as needed for pain.   15 tablet   0   . tamsulosin (FLOMAX) 0.4 MG CAPS capsule   Oral   Take 1 capsule (0.4 mg  total) by mouth daily after supper.   5 capsule   0    BP 110/78  Pulse 89  Temp(Src) 98.6 F (37 C) (Oral)  Resp 18  Ht 6\' 1"  (1.854 m)  Wt 205 lb (92.987 kg)  BMI 27.05 kg/m2  SpO2 100% Physical Exam  Nursing note and vitals reviewed. Constitutional: He appears well-developed and well-nourished.  Uncomfortable appearing  HENT:  Head: Normocephalic and atraumatic.  Mouth/Throat: Oropharynx is clear and moist. No oropharyngeal exudate.  Eyes: Conjunctivae and EOM are normal. Pupils are equal, round, and reactive to light. Right eye exhibits no discharge. Left eye exhibits no discharge. No scleral icterus.  Neck: Normal range of motion. Neck supple. No JVD present. No thyromegaly present.   Cardiovascular: Regular rhythm, normal heart sounds and intact distal pulses.  Exam reveals no gallop and no friction rub.   No murmur heard. Tachycardic to 105 beats per minute, strong pulses at the radial arteries, no JVD  Pulmonary/Chest: Effort normal and breath sounds normal. No respiratory distress. He has no wheezes. He has no rales.  Abdominal: Soft. Bowel sounds are normal. He exhibits no distension and no mass. There is tenderness (focal mild tenderness to the right upper cautery and epigastrium, no guarding, no peritoneal signs, very soft). There is no rebound and no guarding.  Musculoskeletal: Normal range of motion. He exhibits no edema and no tenderness.  Lymphadenopathy:    He has no cervical adenopathy.  Neurological: He is alert. Coordination normal.  Skin: Skin is warm and dry. No rash noted. No erythema.  Psychiatric: He has a normal mood and affect. His behavior is normal.    ED Course  Procedures (including critical care time) Labs Review Labs Reviewed  URINALYSIS, ROUTINE W REFLEX MICROSCOPIC - Abnormal; Notable for the following:    Leukocytes, UA SMALL (*)    All other components within normal limits  URINE MICROSCOPIC-ADD ON - Abnormal; Notable for the following:    Bacteria, UA FEW (*)    All other components within normal limits  CBC - Abnormal; Notable for the following:    WBC 14.0 (*)    All other components within normal limits  URINE CULTURE   Imaging Review No results found.  EKG Interpretation   None       MDM   1. Chronic recurrent pancreatitis    The patient has an exam and history consistent with acute on chronic pancreatitis. His symptoms have only been going on for one hour and 20 minutes according to his report, will attempt pain control and hydration. Antiemetics ordered, check CBC to rule out anemia secondary to possible GI bleed changes in stool habits  Pt has normal Hgb, leukocytosis c/w panc flare up.  His tachycardia has  resolved and on my exam is no longer tachcyardic on repeat exam.  He has had improvement in his sx with IV dilaudid - this did start to wear off and he requested more pain medicine - offered one time dose of fentanyl - the pt is on a significant amount of other medicines including nucynta and fentanyl patch - he agreed to one more dose and d/c home, he denies that Rx for medicine is in conflict with his pain clinic.  D/w pt and family dietary restrictions for next 2 days.  In agreement - understanding expressed, stable for d/c in improved condition according to pt.  Meds given in ED:  Medications  HYDROmorphone (DILAUDID) injection 2 mg (2 mg Intravenous Given 06/05/13 0257)  ondansetron [facility]) injection 4 mg (4 mg Intravenous Given 06/05/13 0257)  sodium chloride 0.9 % bolus 1,000 mL (0 mLs Intravenous Stopped 06/05/13 0352)  fentaNYL (SUBLIMAZE) injection 100 mcg (100 mcg Intravenous Given 06/05/13 0440)    New Prescriptions   OXYCODONE-ACETAMINOPHEN (PERCOCET) 5-325 MG PER TABLET    Take 1 tablet by mouth every 4 (four) hours as needed.      Johnna Acosta, MD 06/05/13 [phone]

## 2013-06-05 NOTE — ED Notes (Signed)
Pt followed by dr. Zena Amos for chronic pain, currently on duragesic patch and nycenta

## 2013-06-05 NOTE — Discharge Instructions (Signed)
Your testing today shows that your blood counts (red cells) are normal, your white blood counts are elevated consistent with having pancreatitis flare up - You have been given dilaudid and fentanyl (these are both opiate medicines) which will continue to work throughout the evening - you have also been given a prescription for percocet - you have informed me that you are able to accept this medicine without risk of termination of your pain contract with your pain clinic doctors.  Please use the percocet exactly as prescribed - no more than 2 tablets every 6 hours for severe pain.  Please call your pain clinic doctors today to arrange follow up for today or Monday.  You may return to the ER for severe or worsening pain or vomiting.  Please call your doctor for a followup appointment within 24-48 hours. When you talk to your doctor please let them know that you were seen in the emergency department and have them acquire all of your records so that they can discuss the findings with you and formulate a treatment plan to fully care for your new and ongoing problems.

## 2013-06-06 LAB — URINE CULTURE
Colony Count: NO GROWTH
Culture: NO GROWTH

## 2013-06-11 ENCOUNTER — Encounter (HOSPITAL_COMMUNITY): Payer: Self-pay | Admitting: Emergency Medicine

## 2013-06-11 DIAGNOSIS — R5381 Other malaise: Secondary | ICD-10-CM | POA: Insufficient documentation

## 2013-06-11 DIAGNOSIS — N390 Urinary tract infection, site not specified: Secondary | ICD-10-CM | POA: Insufficient documentation

## 2013-06-11 DIAGNOSIS — K625 Hemorrhage of anus and rectum: Secondary | ICD-10-CM | POA: Insufficient documentation

## 2013-06-11 DIAGNOSIS — F17200 Nicotine dependence, unspecified, uncomplicated: Secondary | ICD-10-CM | POA: Insufficient documentation

## 2013-06-11 DIAGNOSIS — R5383 Other fatigue: Secondary | ICD-10-CM

## 2013-06-11 DIAGNOSIS — R109 Unspecified abdominal pain: Secondary | ICD-10-CM | POA: Insufficient documentation

## 2013-06-11 LAB — COMPREHENSIVE METABOLIC PANEL
ALK PHOS: 72 U/L (ref 39–117)
ALT: 35 U/L (ref 0–53)
AST: 24 U/L (ref 0–37)
Albumin: 4.2 g/dL (ref 3.5–5.2)
BILIRUBIN TOTAL: 0.3 mg/dL (ref 0.3–1.2)
BUN: 15 mg/dL (ref 6–23)
CHLORIDE: 104 meq/L (ref 96–112)
CO2: 25 meq/L (ref 19–32)
Calcium: 10.1 mg/dL (ref 8.4–10.5)
Creatinine, Ser: 0.99 mg/dL (ref 0.50–1.35)
Glucose, Bld: 101 mg/dL — ABNORMAL HIGH (ref 70–99)
Potassium: 4.6 mEq/L (ref 3.7–5.3)
Sodium: 144 mEq/L (ref 137–147)
TOTAL PROTEIN: 7.8 g/dL (ref 6.0–8.3)

## 2013-06-11 LAB — CBC
HCT: 43.8 % (ref 39.0–52.0)
HEMOGLOBIN: 15.7 g/dL (ref 13.0–17.0)
MCH: 31.3 pg (ref 26.0–34.0)
MCHC: 35.8 g/dL (ref 30.0–36.0)
MCV: 87.3 fL (ref 78.0–100.0)
Platelets: 245 10*3/uL (ref 150–400)
RBC: 5.02 MIL/uL (ref 4.22–5.81)
RDW: 12.8 % (ref 11.5–15.5)
WBC: 10.9 10*3/uL — ABNORMAL HIGH (ref 4.0–10.5)

## 2013-06-11 LAB — TYPE AND SCREEN
ABO/RH(D): O NEG
Antibody Screen: NEGATIVE

## 2013-06-11 NOTE — ED Notes (Signed)
Pt states rectal bleeding since noon along with blood in urine. Blood is bright red to deep dark almost black. States coming out watery and stringy. Also c/o right tomid upper level abdominal pain that radiates to his back with weakness.

## 2013-06-12 ENCOUNTER — Emergency Department (HOSPITAL_BASED_OUTPATIENT_CLINIC_OR_DEPARTMENT_OTHER)
Admission: EM | Admit: 2013-06-12 | Discharge: 2013-06-12 | Disposition: A | Discharge status: Home / Self Care | Payer: BC Managed Care – PPO | Attending: Emergency Medicine | Admitting: Emergency Medicine

## 2013-06-12 ENCOUNTER — Emergency Department (HOSPITAL_COMMUNITY)
Admission: EM | Admit: 2013-06-12 | Discharge: 2013-06-12 | Discharge status: Against Medical Advice | Payer: BC Managed Care – PPO | Attending: Emergency Medicine | Admitting: Emergency Medicine

## 2013-06-12 ENCOUNTER — Emergency Department (HOSPITAL_BASED_OUTPATIENT_CLINIC_OR_DEPARTMENT_OTHER): Payer: BC Managed Care – PPO

## 2013-06-12 ENCOUNTER — Encounter (HOSPITAL_BASED_OUTPATIENT_CLINIC_OR_DEPARTMENT_OTHER): Payer: Self-pay | Admitting: Emergency Medicine

## 2013-06-12 DIAGNOSIS — Z79899 Other long term (current) drug therapy: Secondary | ICD-10-CM | POA: Insufficient documentation

## 2013-06-12 DIAGNOSIS — R1084 Generalized abdominal pain: Secondary | ICD-10-CM | POA: Insufficient documentation

## 2013-06-12 DIAGNOSIS — F17200 Nicotine dependence, unspecified, uncomplicated: Secondary | ICD-10-CM | POA: Insufficient documentation

## 2013-06-12 DIAGNOSIS — Z87442 Personal history of urinary calculi: Secondary | ICD-10-CM | POA: Insufficient documentation

## 2013-06-12 DIAGNOSIS — K625 Hemorrhage of anus and rectum: Secondary | ICD-10-CM | POA: Insufficient documentation

## 2013-06-12 DIAGNOSIS — R109 Unspecified abdominal pain: Secondary | ICD-10-CM

## 2013-06-12 LAB — URINALYSIS, ROUTINE W REFLEX MICROSCOPIC
BILIRUBIN URINE: NEGATIVE
GLUCOSE, UA: NEGATIVE mg/dL
Hgb urine dipstick: NEGATIVE
Ketones, ur: NEGATIVE mg/dL
Nitrite: NEGATIVE
PH: 5.5 (ref 5.0–8.0)
Protein, ur: NEGATIVE mg/dL
Specific Gravity, Urine: 1.015 (ref 1.005–1.030)
Urobilinogen, UA: 0.2 mg/dL (ref 0.0–1.0)

## 2013-06-12 LAB — URINE MICROSCOPIC-ADD ON

## 2013-06-12 LAB — OCCULT BLOOD X 1 CARD TO LAB, STOOL: Fecal Occult Bld: NEGATIVE

## 2013-06-12 LAB — ABO/RH: ABO/RH(D): O NEG

## 2013-06-12 MED ORDER — HYDROMORPHONE HCL PF 2 MG/ML IJ SOLN
2.0000 mg | Freq: Once | INTRAMUSCULAR | Status: AC
  Administered 2013-06-12: 2 mg via INTRAMUSCULAR
  Filled 2013-06-12: qty 1

## 2013-06-12 MED ORDER — HYDROMORPHONE HCL PF 1 MG/ML IJ SOLN
INTRAMUSCULAR | Status: AC
  Administered 2013-06-12: 04:00:00
  Filled 2013-06-12: qty 1

## 2013-06-12 NOTE — ED Provider Notes (Signed)
CSN: [id number]     Arrival date & time 06/12/13  0106 History   First MD Initiated Contact with Patient 06/12/13 0143     Chief Complaint  Patient presents with  . Abdominal Pain   (Consider location/radiation/quality/duration/timing/severity/associated sxs/prior Treatment) Patient is a 38 y.o. male presenting with abdominal pain. The history is provided by the patient.  Abdominal Pain Pain location:  Generalized Pain radiates to:  Does not radiate Pain severity:  Severe Onset quality:  Gradual Timing:  Constant Progression:  Unchanged Chronicity:  Chronic Context: not alcohol use   Relieved by:  Nothing Worsened by:  Nothing tried Ineffective treatments: nucynta and fentanyl. Associated symptoms: no chills, no cough, no flatus and no vomiting   Risk factors: not elderly     Past Medical History  Diagnosis Date  . Pancreatitis   . Pancreatitis   . Kidney stones    Past Surgical History  Procedure Laterality Date  . Appendectomy    . Cholecystectomy    . Kidney stone surgery    . Ercp w/ metal stent placement     History reviewed. No pertinent family history. History  Substance Use Topics  . Smoking status: Current Every Day Smoker -- 0.50 packs/day    Types: Cigarettes  . Smokeless tobacco: Not on file  . Alcohol Use: Yes     Comment: rarely    Review of Systems  Constitutional: Negative for chills.  Respiratory: Negative for cough.   Gastrointestinal: Positive for abdominal pain and anal bleeding. Negative for vomiting and flatus.  All other systems reviewed and are negative.    Allergies  Cortisone; Eggs or egg-derived products; and Ketorolac tromethamine  Home Medications   Current Outpatient Rx  Name  Route  Sig  Dispense  Refill  . Cyanocobalamin (B-12 PO)   Oral   Take 1 tablet by mouth daily.         . Doxylamine-Phenylephrine-APAP (VICKS NYQUIL SINUS) 6.25-5-325 MG CAPS   Oral   Take 1 capsule by mouth daily as needed (for sinus).        . fentaNYL (DURAGESIC - DOSED MCG/HR) 25 MCG/HR   Transdermal   Place 1 patch onto the skin every other day.          . Melatonin 5 MG TABS   Oral   Take 5 mg by mouth at bedtime as needed (for sleep).         . promethazine (PHENERGAN) 12.5 MG tablet   Oral   Take 12.5 mg by mouth every 6 (six) hours as needed for nausea or vomiting.         . tapentadol (NUCYNTA) 50 MG TABS   Oral   Take 75 mg by mouth every 4 (four) hours.           BP 158/95  Pulse 109  Temp(Src) 98.8 F (37.1 C) (Oral)  Resp 18  SpO2 100% Physical Exam  Constitutional: He is oriented to person, place, and time. He appears well-developed and well-nourished. No distress.  HENT:  Head: Normocephalic and atraumatic.  Mouth/Throat: Oropharynx is clear and moist.  Eyes: Conjunctivae are normal. Pupils are equal, round, and reactive to light.  Neck: Normal range of motion. Neck supple.  Cardiovascular: Normal rate, regular rhythm and intact distal pulses.   Pulmonary/Chest: Effort normal and breath sounds normal. He has no wheezes. He has no rales.  Abdominal: Soft. Bowel sounds are normal. There is no tenderness. There is no rebound and no guarding.  Genitourinary: Guaiac negative stool.  Chaperone present  Musculoskeletal: Normal range of motion.  Neurological: He is alert and oriented to person, place, and time.  Skin: Skin is warm and dry.  Psychiatric: He has a normal mood and affect.    ED Course  Procedures (including critical care time) Labs Review Labs Reviewed  URINALYSIS, ROUTINE W REFLEX MICROSCOPIC - Abnormal; Notable for the following:    Leukocytes, UA TRACE (*)    All other components within normal limits  URINE MICROSCOPIC-ADD ON - Abnormal; Notable for the following:    Casts HYALINE CASTS (*)    All other components within normal limits  OCCULT BLOOD X 1 CARD TO LAB, STOOL   Imaging Review Ct Abdomen Pelvis Wo Contrast  06/12/2013   CLINICAL DATA:  Abdominal pain  and hematuria  EXAM: CT ABDOMEN AND PELVIS WITHOUT CONTRAST  TECHNIQUE: Multidetector CT imaging of the abdomen and pelvis was performed following the standard protocol without intravenous contrast.  COMPARISON:  03/29/2012  FINDINGS: BODY WALL: Unremarkable.  LOWER CHEST: Unremarkable.  ABDOMEN/PELVIS:  Liver: Geographic fatty change within the left liver, likely reactive to the biliary changes.  Biliary: Chronic intra and extrahepatic biliary prominence, status post cholecystectomy and sphincterotomy. There is chronic pneumobilia.  Pancreas: Unremarkable.  Spleen: Unremarkable.  Adrenals: Unremarkable.  Kidneys and ureters: No hydronephrosis. 4 mm stone in the interpolar right kidney. The number of stones has decreased from prior. No ureteral calculus.  Bladder: Unremarkable.  Reproductive: Unremarkable.  Bowel: No obstruction. Appendectomy.  Retroperitoneum: No mass or adenopathy.  Peritoneum: No free fluid or gas.  Vascular: No acute abnormality.  OSSEOUS: No acute abnormalities.  IMPRESSION: 1. No ureteral stone or hydronephrosis. 2. Right nephrolithiasis, decreased in number from 03/29/2012.   Electronically Signed   By: Jorje Guild M.D.   On: 06/12/2013 03:33    EKG Interpretation   None       MDM  No diagnosis found. Patient's exam labs and CT benign.  Obtained a good stool sample and had normal color stool in the rectal vault which was hemoccult negative perhaps had fissure bleeding but none at this time.  Moreover hemoglobin is stable.  Follow up with pain management for ongoing care.     Carlisle Beers, MD 06/12/13 [phone]

## 2013-06-12 NOTE — ED Notes (Signed)
Pt has duragesic patch on and took a nucynta around 1730 this afternoon with no improvement

## 2013-06-12 NOTE — ED Notes (Signed)
Pt states that he is leaving and going to [facility]

## 2013-06-12 NOTE — Discharge Instructions (Signed)

## 2013-06-12 NOTE — ED Notes (Signed)
Pt also reports that he has been having blood in his urine and stool,

## 2013-06-12 NOTE — ED Notes (Signed)
Pt with long standing history of pancreatitis, followed by pain management for management of chronic pain, seen at [facility] last Friday for pain, pain was controlled this weekend, pain restarted on Monday, pt now states that he has developed blood in stool, + nausea, no vomiting

## 2013-10-09 ENCOUNTER — Emergency Department (HOSPITAL_BASED_OUTPATIENT_CLINIC_OR_DEPARTMENT_OTHER)
Admission: EM | Admit: 2013-10-09 | Discharge: 2013-10-09 | Disposition: A | Discharge status: Home / Self Care | Payer: BC Managed Care – PPO | Attending: Emergency Medicine | Admitting: Emergency Medicine

## 2013-10-09 ENCOUNTER — Encounter (HOSPITAL_BASED_OUTPATIENT_CLINIC_OR_DEPARTMENT_OTHER): Payer: Self-pay | Admitting: Emergency Medicine

## 2013-10-09 DIAGNOSIS — F17200 Nicotine dependence, unspecified, uncomplicated: Secondary | ICD-10-CM | POA: Insufficient documentation

## 2013-10-09 DIAGNOSIS — Z79899 Other long term (current) drug therapy: Secondary | ICD-10-CM | POA: Insufficient documentation

## 2013-10-09 DIAGNOSIS — Z9089 Acquired absence of other organs: Secondary | ICD-10-CM | POA: Insufficient documentation

## 2013-10-09 DIAGNOSIS — K861 Other chronic pancreatitis: Secondary | ICD-10-CM

## 2013-10-09 DIAGNOSIS — Z87442 Personal history of urinary calculi: Secondary | ICD-10-CM | POA: Insufficient documentation

## 2013-10-09 LAB — COMPREHENSIVE METABOLIC PANEL
ALT: 16 U/L (ref 0–53)
AST: 20 U/L (ref 0–37)
Albumin: 4.4 g/dL (ref 3.5–5.2)
Alkaline Phosphatase: 67 U/L (ref 39–117)
BILIRUBIN TOTAL: 0.6 mg/dL (ref 0.3–1.2)
BUN: 12 mg/dL (ref 6–23)
CHLORIDE: 102 meq/L (ref 96–112)
CO2: 28 mEq/L (ref 19–32)
CREATININE: 0.9 mg/dL (ref 0.50–1.35)
Calcium: 10.2 mg/dL (ref 8.4–10.5)
GFR calc Af Amer: >90 mL/min (ref >90)
GFR calc non Af Amer: >90 mL/min (ref >90)
Glucose, Bld: 102 mg/dL — ABNORMAL HIGH (ref 70–99)
Potassium: 3.8 mEq/L (ref 3.7–5.3)
Sodium: 142 mEq/L (ref 137–147)
Total Protein: 7.6 g/dL (ref 6.0–8.3)

## 2013-10-09 LAB — CBC WITH DIFFERENTIAL/PLATELET
BASOS ABS: 0.1 10*3/uL (ref 0.0–0.1)
BASOS PCT: 0 % (ref 0–1)
EOS ABS: 0.3 10*3/uL (ref 0.0–0.7)
Eosinophils Relative: 2 % (ref 0–5)
HEMATOCRIT: 46 % (ref 39.0–52.0)
Hemoglobin: 16.7 g/dL (ref 13.0–17.0)
Lymphocytes Relative: 18 % (ref 12–46)
Lymphs Abs: 2.9 10*3/uL (ref 0.7–4.0)
MCH: 31.3 pg (ref 26.0–34.0)
MCHC: 36.3 g/dL — AB (ref 30.0–36.0)
MCV: 86.3 fL (ref 78.0–100.0)
MONO ABS: 1.2 10*3/uL — AB (ref 0.1–1.0)
Monocytes Relative: 7 % (ref 3–12)
NEUTROS ABS: 12.1 10*3/uL — AB (ref 1.7–7.7)
Neutrophils Relative %: 73 % (ref 43–77)
Platelets: 216 10*3/uL (ref 150–400)
RBC: 5.33 MIL/uL (ref 4.22–5.81)
RDW: 12.7 % (ref 11.5–15.5)
WBC: 16.6 10*3/uL — ABNORMAL HIGH (ref 4.0–10.5)

## 2013-10-09 LAB — LIPASE, BLOOD: Lipase: 59 U/L (ref 11–59)

## 2013-10-09 MED ORDER — HYDROMORPHONE HCL PF 1 MG/ML IJ SOLN
1.0000 mg | Freq: Once | INTRAMUSCULAR | Status: AC
  Administered 2013-10-09: 1 mg via INTRAVENOUS
  Filled 2013-10-09: qty 1

## 2013-10-09 MED ORDER — PROMETHAZINE HCL 25 MG PO TABS: 25.0000 mg | ORAL_TABLET | Freq: Four times a day (QID) | ORAL | Status: DC | PRN

## 2013-10-09 MED ORDER — FENTANYL CITRATE 0.05 MG/ML IJ SOLN
50.0000 ug | Freq: Once | INTRAMUSCULAR | Status: AC
  Administered 2013-10-09: 50 ug via INTRAVENOUS
  Filled 2013-10-09: qty 2

## 2013-10-09 MED ORDER — ONDANSETRON HCL 4 MG/2ML IJ SOLN
4.0000 mg | Freq: Once | INTRAMUSCULAR | Status: AC
  Administered 2013-10-09: 4 mg via INTRAVENOUS
  Filled 2013-10-09: qty 2

## 2013-10-09 MED ORDER — SODIUM CHLORIDE 0.9 % IV BOLUS (SEPSIS)
1000.0000 mL | Freq: Once | INTRAVENOUS | Status: AC
  Administered 2013-10-09: 1000 mL via INTRAVENOUS

## 2013-10-09 NOTE — ED Notes (Signed)
Pt reports chronic pancreatitis.  States that he had a flare up that started 2 hours PTA.

## 2013-10-09 NOTE — ED Provider Notes (Signed)
CSN: [id number]     Arrival date & time 10/09/13  1324 History   First MD Initiated Contact with Patient 10/09/13 [phone]     Chief Complaint  Patient presents with  . Pancreatitis     (Consider location/radiation/quality/duration/timing/severity/associated sxs/prior Treatment) HPI Comments: Patient presents with abdominal pain. He has a history of recurrent pancreatitis. He says about 6 years ago he had a biliary tract manipulation and stent that was placed to his pancreas that has caused him to have recurrent pancreatitis. He comes in with upper abdominal pain that feels consistent with his past episodes of pancreatitis. He's had some nausea and vomiting as well. He denies any fevers or chills. He denies any urinary symptoms. He took his pain medication at home without improvement in symptoms.   Past Medical History  Diagnosis Date  . Pancreatitis   . Pancreatitis   . Kidney stones    Past Surgical History  Procedure Laterality Date  . Appendectomy    . Cholecystectomy    . Kidney stone surgery    . Ercp w/ metal stent placement     History reviewed. No pertinent family history. History  Substance Use Topics  . Smoking status: Current Every Day Smoker -- 0.50 packs/day    Types: Cigarettes  . Smokeless tobacco: Not on file  . Alcohol Use: Yes     Comment: rarely    Review of Systems  Constitutional: Negative for fever, chills, diaphoresis and fatigue.  HENT: Negative for congestion, rhinorrhea and sneezing.   Eyes: Negative.   Respiratory: Negative for cough, chest tightness and shortness of breath.   Cardiovascular: Negative for chest pain and leg swelling.  Gastrointestinal: Positive for nausea, vomiting and abdominal pain. Negative for diarrhea and blood in stool.  Genitourinary: Negative for frequency, hematuria, flank pain and difficulty urinating.  Musculoskeletal: Negative for arthralgias and back pain.  Skin: Negative for rash.  Neurological: Negative for  dizziness, speech difficulty, weakness, numbness and headaches.      Allergies  Cortisone; Eggs or egg-derived products; and Ketorolac tromethamine  Home Medications   Prior to Admission medications   Medication Sig Start Date End Date Taking? Authorizing Provider  Cyanocobalamin (B-12 PO) Take 1 tablet by mouth daily.    Historical Provider, MD  Doxylamine-Phenylephrine-APAP (VICKS NYQUIL SINUS) 6.25-5-325 MG CAPS Take 1 capsule by mouth daily as needed (for sinus).    Historical Provider, MD  fentaNYL (DURAGESIC - DOSED MCG/HR) 25 MCG/HR Place 1 patch onto the skin every other day.     Historical Provider, MD  Melatonin 5 MG TABS Take 5 mg by mouth at bedtime as needed (for sleep).    Historical Provider, MD  promethazine (PHENERGAN) 12.5 MG tablet Take 12.5 mg by mouth every 6 (six) hours as needed for nausea or vomiting.    Historical Provider, MD  tapentadol (NUCYNTA) 50 MG TABS Take 75 mg by mouth every 4 (four) hours.     Historical Provider, MD   BP 125/90  Pulse 74  Temp(Src) 98.2 F (36.8 C) (Oral)  Resp 18  Ht 6\' 1"  (1.854 m)  Wt 205 lb (92.987 kg)  BMI 27.05 kg/m2  SpO2 99% Physical Exam  Constitutional: He is oriented to person, place, and time. He appears well-developed and well-nourished.  HENT:  Head: Normocephalic and atraumatic.  Eyes: Pupils are equal, round, and reactive to light.  Neck: Normal range of motion. Neck supple.  Cardiovascular: Normal rate, regular rhythm and normal heart sounds.   Pulmonary/Chest: Effort normal  and breath sounds normal. No respiratory distress. He has no wheezes. He has no rales. He exhibits no tenderness.  Abdominal: Soft. Bowel sounds are normal. There is tenderness (moderate tenderness across his upper abdomen). There is no rebound and no guarding.  Musculoskeletal: Normal range of motion. He exhibits no edema.  Lymphadenopathy:    He has no cervical adenopathy.  Neurological: He is alert and oriented to person, place, and  time.  Skin: Skin is warm and dry. No rash noted.  Psychiatric: He has a normal mood and affect.    ED Course  Procedures (including critical care time) Labs Review Results for orders placed during the hospital encounter of 10/09/13  CBC WITH DIFFERENTIAL      Result Value Ref Range   WBC 16.6 (*) 4.0 - 10.5 K/uL   RBC 5.33  4.22 - 5.81 MIL/uL   Hemoglobin 16.7  13.0 - 17.0 g/dL   HCT 46.0  39.0 - 52.0 %   MCV 86.3  78.0 - 100.0 fL   MCH 31.3  26.0 - 34.0 pg   MCHC 36.3 (*) 30.0 - 36.0 g/dL   RDW 12.7  11.5 - 15.5 %   Platelets 216  150 - 400 K/uL   Neutrophils Relative % 73  43 - 77 %   Neutro Abs 12.1 (*) 1.7 - 7.7 K/uL   Lymphocytes Relative 18  12 - 46 %   Lymphs Abs 2.9  0.7 - 4.0 K/uL   Monocytes Relative 7  3 - 12 %   Monocytes Absolute 1.2 (*) 0.1 - 1.0 K/uL   Eosinophils Relative 2  0 - 5 %   Eosinophils Absolute 0.3  0.0 - 0.7 K/uL   Basophils Relative 0  0 - 1 %   Basophils Absolute 0.1  0.0 - 0.1 K/uL  COMPREHENSIVE METABOLIC PANEL      Result Value Ref Range   Sodium 142  137 - 147 mEq/L   Potassium 3.8  3.7 - 5.3 mEq/L   Chloride 102  96 - 112 mEq/L   CO2 28  19 - 32 mEq/L   Glucose, Bld 102 (*) 70 - 99 mg/dL   BUN 12  6 - 23 mg/dL   Creatinine, Ser 0.90  0.50 - 1.35 mg/dL   Calcium 10.2  8.4 - 10.5 mg/dL   Total Protein 7.6  6.0 - 8.3 g/dL   Albumin 4.4  3.5 - 5.2 g/dL   AST 20  0 - 37 U/L   ALT 16  0 - 53 U/L   Alkaline Phosphatase 67  39 - 117 U/L   Total Bilirubin 0.6  0.3 - 1.2 mg/dL   GFR calc non Af Amer >90  >90 mL/min   GFR calc Af Amer >90  >90 mL/min  LIPASE, BLOOD      Result Value Ref Range   Lipase 59  11 - 59 U/L   No results found.    Imaging Review No results found.   EKG Interpretation None      MDM   Final diagnoses:  Pancreatitis, chronic    Patient presents with upper abdominal pain consistent with his past flareups of pancreatitis. He has no ongoing vomiting. His pain is being controlled in the ED. He has pain  medications to use at home and I did give him a prescription for Phenergan to use as well. At this point, I don't feel that he needs any imaging studies. Encouraged him to return if his symptoms worsen.  Malvin Johns, MD 10/09/13 [phone]

## 2014-01-24 ENCOUNTER — Encounter (HOSPITAL_BASED_OUTPATIENT_CLINIC_OR_DEPARTMENT_OTHER): Payer: Self-pay | Admitting: Emergency Medicine

## 2014-01-24 ENCOUNTER — Emergency Department (HOSPITAL_BASED_OUTPATIENT_CLINIC_OR_DEPARTMENT_OTHER)
Admission: EM | Admit: 2014-01-24 | Discharge: 2014-01-24 | Disposition: A | Discharge status: Home / Self Care | Payer: BC Managed Care – PPO | Attending: Emergency Medicine | Admitting: Emergency Medicine

## 2014-01-24 DIAGNOSIS — Z87442 Personal history of urinary calculi: Secondary | ICD-10-CM | POA: Diagnosis not present

## 2014-01-24 DIAGNOSIS — Z9089 Acquired absence of other organs: Secondary | ICD-10-CM | POA: Insufficient documentation

## 2014-01-24 DIAGNOSIS — Z79899 Other long term (current) drug therapy: Secondary | ICD-10-CM | POA: Diagnosis not present

## 2014-01-24 DIAGNOSIS — R1013 Epigastric pain: Secondary | ICD-10-CM | POA: Insufficient documentation

## 2014-01-24 DIAGNOSIS — F17200 Nicotine dependence, unspecified, uncomplicated: Secondary | ICD-10-CM | POA: Diagnosis not present

## 2014-01-24 DIAGNOSIS — K859 Acute pancreatitis without necrosis or infection, unspecified: Secondary | ICD-10-CM | POA: Diagnosis not present

## 2014-01-24 DIAGNOSIS — K8500 Idiopathic acute pancreatitis without necrosis or infection: Secondary | ICD-10-CM

## 2014-01-24 LAB — CBC WITH DIFFERENTIAL/PLATELET
BASOS ABS: 0 10*3/uL (ref 0.0–0.1)
Basophils Relative: 0 % (ref 0–1)
EOS PCT: 2 % (ref 0–5)
Eosinophils Absolute: 0.2 10*3/uL (ref 0.0–0.7)
HCT: 42.1 % (ref 39.0–52.0)
Hemoglobin: 14.9 g/dL (ref 13.0–17.0)
LYMPHS PCT: 28 % (ref 12–46)
Lymphs Abs: 3.8 10*3/uL (ref 0.7–4.0)
MCH: 30.7 pg (ref 26.0–34.0)
MCHC: 35.4 g/dL (ref 30.0–36.0)
MCV: 86.8 fL (ref 78.0–100.0)
Monocytes Absolute: 1.2 10*3/uL — ABNORMAL HIGH (ref 0.1–1.0)
Monocytes Relative: 9 % (ref 3–12)
Neutro Abs: 8.4 10*3/uL — ABNORMAL HIGH (ref 1.7–7.7)
Neutrophils Relative %: 61 % (ref 43–77)
PLATELETS: 240 10*3/uL (ref 150–400)
RBC: 4.85 MIL/uL (ref 4.22–5.81)
RDW: 12.8 % (ref 11.5–15.5)
WBC: 13.7 10*3/uL — AB (ref 4.0–10.5)

## 2014-01-24 LAB — COMPREHENSIVE METABOLIC PANEL
ALK PHOS: 69 U/L (ref 39–117)
ALT: 17 U/L (ref 0–53)
AST: 21 U/L (ref 0–37)
Albumin: 3.9 g/dL (ref 3.5–5.2)
Anion gap: 16 — ABNORMAL HIGH (ref 5–15)
BUN: 16 mg/dL (ref 6–23)
CALCIUM: 10.1 mg/dL (ref 8.4–10.5)
CO2: 25 meq/L (ref 19–32)
Chloride: 102 mEq/L (ref 96–112)
Creatinine, Ser: 0.8 mg/dL (ref 0.50–1.35)
GFR calc Af Amer: >90 mL/min (ref >90)
GLUCOSE: 106 mg/dL — AB (ref 70–99)
Potassium: 3.5 mEq/L — ABNORMAL LOW (ref 3.7–5.3)
Sodium: 143 mEq/L (ref 137–147)
Total Bilirubin: 0.4 mg/dL (ref 0.3–1.2)
Total Protein: 7.3 g/dL (ref 6.0–8.3)

## 2014-01-24 LAB — LIPASE, BLOOD: Lipase: 145 U/L — ABNORMAL HIGH (ref 11–59)

## 2014-01-24 MED ORDER — ONDANSETRON HCL 4 MG/2ML IJ SOLN
4.0000 mg | Freq: Once | INTRAMUSCULAR | Status: AC
  Administered 2014-01-24: 4 mg via INTRAVENOUS
  Filled 2014-01-24: qty 2

## 2014-01-24 MED ORDER — SODIUM CHLORIDE 0.9 % IV SOLN
1000.0000 mL | INTRAVENOUS | Status: DC
  Administered 2014-01-24: 1000 mL via INTRAVENOUS

## 2014-01-24 MED ORDER — HYDROMORPHONE HCL PF 1 MG/ML IJ SOLN
1.0000 mg | Freq: Once | INTRAMUSCULAR | Status: AC
  Administered 2014-01-24: 1 mg via INTRAVENOUS
  Filled 2014-01-24: qty 1

## 2014-01-24 MED ORDER — SODIUM CHLORIDE 0.9 % IV SOLN: 1000.0000 mL | Freq: Once | INTRAVENOUS | Status: DC

## 2014-01-24 MED ORDER — SODIUM CHLORIDE 0.9 % IV SOLN: 1000.0000 mL | INTRAVENOUS | Status: DC

## 2014-01-24 MED ORDER — SODIUM CHLORIDE 0.9 % IV SOLN: 80.0000 mg | Freq: Once | INTRAVENOUS | Status: DC

## 2014-01-24 NOTE — ED Notes (Signed)
Patient reports that he has chronic pancreatitis and reports that he feels like he is having an "attack tonight" reports nothing spicy or ETOH in a couple of days. Had had left upper quad to mid abdominal pain x a couple of days.

## 2014-01-24 NOTE — Discharge Instructions (Signed)
Continue with your pain management at home.  Acute Pancreatitis Acute pancreatitis is a disease in which the pancreas becomes suddenly inflamed. The pancreas is a large gland located behind your stomach. The pancreas produces enzymes that help digest food. The pancreas also releases the hormones glucagon and insulin that help regulate blood sugar. Damage to the pancreas occurs when the digestive enzymes from the pancreas are activated and begin attacking the pancreas before being released into the intestine. Most acute attacks last a couple of days and can cause serious complications. Some people become dehydrated and develop low blood pressure. In severe cases, bleeding into the pancreas can lead to shock and can be life-threatening. The lungs, heart, and kidneys may fail. CAUSES  Pancreatitis can happen to anyone. In some cases, the cause is unknown. Most cases are caused by:  Alcohol abuse.  Gallstones. Other less common causes are:  Certain medicines.  Exposure to certain chemicals.  Infection.  Damage caused by an accident (trauma).  Abdominal surgery. SYMPTOMS   Pain in the upper abdomen that may radiate to the back.  Tenderness and swelling of the abdomen.  Nausea and vomiting. DIAGNOSIS  Your caregiver will perform a physical exam. Blood and stool tests may be done to confirm the diagnosis. Imaging tests may also be done, such as X-rays, CT scans, or an ultrasound of the abdomen. TREATMENT  Treatment usually requires a stay in the hospital. Treatment may include:  Pain medicine.  Fluid replacement through an intravenous line (IV).  Placing a tube in the stomach to remove stomach contents and control vomiting.  Not eating for 3 or 4 days. This gives your pancreas a rest, because enzymes are not being produced that can cause further damage.  Antibiotic medicines if your condition is caused by an infection.  Surgery of the pancreas or gallbladder. HOME CARE  INSTRUCTIONS   Follow the diet advised by your caregiver. This may involve avoiding alcohol and decreasing the amount of fat in your diet.  Eat smaller, more frequent meals. This reduces the amount of digestive juices the pancreas produces.  Drink enough fluids to keep your urine clear or pale yellow.  Only take over-the-counter or prescription medicines as directed by your caregiver.  Avoid drinking alcohol if it caused your condition.  Do not smoke.  Get plenty of rest.  Check your blood sugar at home as directed by your caregiver.  Keep all follow-up appointments as directed by your caregiver. SEEK MEDICAL CARE IF:   You do not recover as quickly as expected.  You develop new or worsening symptoms.  You have persistent pain, weakness, or nausea.  You recover and then have another episode of pain. SEEK IMMEDIATE MEDICAL CARE IF:   You are unable to eat or keep fluids down.  Your pain becomes severe.  You have a fever or persistent symptoms for more than 2 to 3 days.  You have a fever and your symptoms suddenly get worse.  Your skin or the white part of your eyes turn yellow (jaundice).  You develop vomiting.  You feel dizzy, or you faint.  Your blood sugar is high (over 300 mg/dL). MAKE SURE YOU:   Understand these instructions.  Will watch your condition.  Will get help right away if you are not doing well or get worse. Document Released: 05/14/2005 Document Revised: 11/13/2011 Document Reviewed: 08/23/2011 [facility] Patient Information 2015 [place], [place]. This information is not intended to replace advice given to you by your health care provider.  Make sure you discuss any questions you have with your health care provider. ° °

## 2014-01-24 NOTE — ED Provider Notes (Signed)
CSN: [id number]     Arrival date & time 01/24/14  0222 History   First MD Initiated Contact with Patient 01/24/14 0239     Chief Complaint  Patient presents with  . Abdominal Pain     (Consider location/radiation/quality/duration/timing/severity/associated sxs/prior Treatment) Patient is a 38 y.o. male presenting with abdominal pain. The history is provided by the patient.  Abdominal Pain He had onset this evening of epigastric pain with some radiation through the back. Pain is sharp and he rates it an 8/10. Pain is typical of his pancreatitis. Nothing makes it better nothing makes it worse. There has been associated nausea without vomiting. He denies fever or chills. He did have a supple with some banana peppers on it but denies anything also with spicy. He has not had any alcohol intake. He relates that his original pancreatitis was a complication of an ERCP and stent placement. Of note, he is under care of pain management.  Past Medical History  Diagnosis Date  . Pancreatitis   . Pancreatitis   . Kidney stones    Past Surgical History  Procedure Laterality Date  . Appendectomy    . Cholecystectomy    . Kidney stone surgery    . Ercp w/ metal stent placement     History reviewed. No pertinent family history. History  Substance Use Topics  . Smoking status: Current Every Day Smoker -- 0.50 packs/day    Types: Cigarettes  . Smokeless tobacco: Not on file  . Alcohol Use: Yes     Comment: rarely    Review of Systems  Gastrointestinal: Positive for abdominal pain.  All other systems reviewed and are negative.     Allergies  Cortisone; Eggs or egg-derived products; and Ketorolac tromethamine  Home Medications   Prior to Admission medications   Medication Sig Start Date End Date Taking? Authorizing Provider  fentaNYL (DURAGESIC - DOSED MCG/HR) 25 MCG/HR Place 1 patch onto the skin every other day.    Yes Historical Provider, MD  promethazine (PHENERGAN) 12.5 MG tablet  Take 12.5 mg by mouth every 6 (six) hours as needed for nausea or vomiting.   Yes Historical Provider, MD  tapentadol (NUCYNTA) 50 MG TABS Take 100 mg by mouth every 6 (six) hours as needed for moderate pain.    Yes Historical Provider, MD  Cyanocobalamin (B-12 PO) Take 1 tablet by mouth daily.    Historical Provider, MD  Doxylamine-Phenylephrine-APAP (VICKS NYQUIL SINUS) 6.25-5-325 MG CAPS Take 1 capsule by mouth daily as needed (for sinus).    Historical Provider, MD  Melatonin 5 MG TABS Take 5 mg by mouth at bedtime as needed (for sleep).    Historical Provider, MD  promethazine (PHENERGAN) 25 MG tablet Take 1 tablet (25 mg total) by mouth every 6 (six) hours as needed for nausea or vomiting. 10/09/13   Malvin Johns, MD   BP 156/97  Pulse 85  Temp(Src) 98.2 F (36.8 C) (Oral)  Resp 23  Ht 6\' 1"  (1.854 m)  Wt 198 lb (89.812 kg)  BMI 26.13 kg/m2  SpO2 98% Physical Exam  Nursing note and vitals reviewed.  38 year old male, resting comfortably and in no acute distress. Vital signs are significant for hypertension and tachypnea. Oxygen saturation is 98%, which is normal. Head is normocephalic and atraumatic. PERRLA, EOMI. Oropharynx is clear. Neck is nontender and supple without adenopathy or JVD. Back is nontender and there is no CVA tenderness. Lungs are clear without rales, wheezes, or rhonchi. Chest is nontender.  Heart has regular rate and rhythm without murmur. Abdomen is soft, flat, with moderate epigastric tenderness. There is no rebound or guarding. There are no masses or hepatosplenomegaly and peristalsis is hypoactive. Extremities have no cyanosis or edema, full range of motion is present. Skin is warm and dry without rash. Neurologic: Mental status is normal, cranial nerves are intact, there are no motor or sensory deficits.  ED Course  Procedures (including critical care time) Labs Review Results for orders placed during the hospital encounter of 01/24/14  CBC WITH  DIFFERENTIAL      Result Value Ref Range   WBC 13.7 (*) 4.0 - 10.5 K/uL   RBC 4.85  4.22 - 5.81 MIL/uL   Hemoglobin 14.9  13.0 - 17.0 g/dL   HCT 42.1  39.0 - 52.0 %   MCV 86.8  78.0 - 100.0 fL   MCH 30.7  26.0 - 34.0 pg   MCHC 35.4  30.0 - 36.0 g/dL   RDW 12.8  11.5 - 15.5 %   Platelets 240  150 - 400 K/uL   Neutrophils Relative % 61  43 - 77 %   Neutro Abs 8.4 (*) 1.7 - 7.7 K/uL   Lymphocytes Relative 28  12 - 46 %   Lymphs Abs 3.8  0.7 - 4.0 K/uL   Monocytes Relative 9  3 - 12 %   Monocytes Absolute 1.2 (*) 0.1 - 1.0 K/uL   Eosinophils Relative 2  0 - 5 %   Eosinophils Absolute 0.2  0.0 - 0.7 K/uL   Basophils Relative 0  0 - 1 %   Basophils Absolute 0.0  0.0 - 0.1 K/uL  COMPREHENSIVE METABOLIC PANEL      Result Value Ref Range   Sodium 143  137 - 147 mEq/L   Potassium 3.5 (*) 3.7 - 5.3 mEq/L   Chloride 102  96 - 112 mEq/L   CO2 25  19 - 32 mEq/L   Glucose, Bld 106 (*) 70 - 99 mg/dL   BUN 16  6 - 23 mg/dL   Creatinine, Ser 0.80  0.50 - 1.35 mg/dL   Calcium 10.1  8.4 - 10.5 mg/dL   Total Protein 7.3  6.0 - 8.3 g/dL   Albumin 3.9  3.5 - 5.2 g/dL   AST 21  0 - 37 U/L   ALT 17  0 - 53 U/L   Alkaline Phosphatase 69  39 - 117 U/L   Total Bilirubin 0.4  0.3 - 1.2 mg/dL   GFR calc non Af Amer >90  >90 mL/min   GFR calc Af Amer >90  >90 mL/min   Anion gap 16 (*) 5 - 15  LIPASE, BLOOD      Result Value Ref Range   Lipase 145 (*) 11 - 59 U/L    MDM   Final diagnoses:  Idiopathic acute pancreatitis    Probable recurrent pancreatitis. Old records are reviewed and he has multiple ED visits for exacerbations of chronic pancreatitis.  Lipases come back elevated. He did not get adequate pain relief with initial dose of hydromorphone but did get adequate pain relief with a second dose and he is discharged. He is under pain management and has adequate analgesics at home.  Delora Fuel, MD 14/97/02 6378

## 2014-03-23 ENCOUNTER — Emergency Department (HOSPITAL_BASED_OUTPATIENT_CLINIC_OR_DEPARTMENT_OTHER)
Admission: EM | Admit: 2014-03-23 | Discharge: 2014-03-23 | Disposition: A | Discharge status: Home / Self Care | Payer: BC Managed Care – PPO | Attending: Emergency Medicine | Admitting: Emergency Medicine

## 2014-03-23 ENCOUNTER — Emergency Department (HOSPITAL_BASED_OUTPATIENT_CLINIC_OR_DEPARTMENT_OTHER): Payer: BC Managed Care – PPO

## 2014-03-23 ENCOUNTER — Encounter (HOSPITAL_BASED_OUTPATIENT_CLINIC_OR_DEPARTMENT_OTHER): Payer: Self-pay | Admitting: Emergency Medicine

## 2014-03-23 DIAGNOSIS — R112 Nausea with vomiting, unspecified: Secondary | ICD-10-CM | POA: Diagnosis not present

## 2014-03-23 DIAGNOSIS — Z720 Tobacco use: Secondary | ICD-10-CM | POA: Insufficient documentation

## 2014-03-23 DIAGNOSIS — Z8719 Personal history of other diseases of the digestive system: Secondary | ICD-10-CM | POA: Insufficient documentation

## 2014-03-23 DIAGNOSIS — Z79899 Other long term (current) drug therapy: Secondary | ICD-10-CM | POA: Insufficient documentation

## 2014-03-23 DIAGNOSIS — Z87442 Personal history of urinary calculi: Secondary | ICD-10-CM | POA: Diagnosis not present

## 2014-03-23 DIAGNOSIS — M549 Dorsalgia, unspecified: Secondary | ICD-10-CM | POA: Insufficient documentation

## 2014-03-23 DIAGNOSIS — R1084 Generalized abdominal pain: Secondary | ICD-10-CM | POA: Insufficient documentation

## 2014-03-23 DIAGNOSIS — Z9889 Other specified postprocedural states: Secondary | ICD-10-CM | POA: Diagnosis not present

## 2014-03-23 DIAGNOSIS — R109 Unspecified abdominal pain: Secondary | ICD-10-CM

## 2014-03-23 DIAGNOSIS — Z9089 Acquired absence of other organs: Secondary | ICD-10-CM | POA: Insufficient documentation

## 2014-03-23 LAB — CBC WITH DIFFERENTIAL/PLATELET
BASOS ABS: 0 10*3/uL (ref 0.0–0.1)
BASOS PCT: 1 % (ref 0–1)
EOS ABS: 0.2 10*3/uL (ref 0.0–0.7)
EOS PCT: 3 % (ref 0–5)
HCT: 42.2 % (ref 39.0–52.0)
Hemoglobin: 15.1 g/dL (ref 13.0–17.0)
Lymphocytes Relative: 32 % (ref 12–46)
Lymphs Abs: 2.7 10*3/uL (ref 0.7–4.0)
MCH: 30.5 pg (ref 26.0–34.0)
MCHC: 35.8 g/dL (ref 30.0–36.0)
MCV: 85.3 fL (ref 78.0–100.0)
Monocytes Absolute: 0.8 10*3/uL (ref 0.1–1.0)
Monocytes Relative: 10 % (ref 3–12)
Neutro Abs: 4.6 10*3/uL (ref 1.7–7.7)
Neutrophils Relative %: 54 % (ref 43–77)
PLATELETS: 210 10*3/uL (ref 150–400)
RBC: 4.95 MIL/uL (ref 4.22–5.81)
RDW: 12.7 % (ref 11.5–15.5)
WBC: 8.3 10*3/uL (ref 4.0–10.5)

## 2014-03-23 LAB — LIPASE, BLOOD: Lipase: 37 U/L (ref 11–59)

## 2014-03-23 LAB — COMPREHENSIVE METABOLIC PANEL
ALK PHOS: 73 U/L (ref 39–117)
ALT: 14 U/L (ref 0–53)
ANION GAP: 15 (ref 5–15)
AST: 18 U/L (ref 0–37)
Albumin: 4.5 g/dL (ref 3.5–5.2)
BUN: 15 mg/dL (ref 6–23)
CALCIUM: 9.9 mg/dL (ref 8.4–10.5)
CO2: 25 meq/L (ref 19–32)
Chloride: 102 mEq/L (ref 96–112)
Creatinine, Ser: 0.8 mg/dL (ref 0.50–1.35)
GLUCOSE: 101 mg/dL — AB (ref 70–99)
Potassium: 3.8 mEq/L (ref 3.7–5.3)
SODIUM: 142 meq/L (ref 137–147)
Total Bilirubin: 0.3 mg/dL (ref 0.3–1.2)
Total Protein: 7.4 g/dL (ref 6.0–8.3)

## 2014-03-23 LAB — URINALYSIS, ROUTINE W REFLEX MICROSCOPIC
Bilirubin Urine: NEGATIVE
Glucose, UA: NEGATIVE mg/dL
Hgb urine dipstick: NEGATIVE
KETONES UR: NEGATIVE mg/dL
Leukocytes, UA: NEGATIVE
NITRITE: NEGATIVE
Protein, ur: NEGATIVE mg/dL
Specific Gravity, Urine: 1.007 (ref 1.005–1.030)
UROBILINOGEN UA: 0.2 mg/dL (ref 0.0–1.0)
pH: 7 (ref 5.0–8.0)

## 2014-03-23 MED ORDER — ONDANSETRON HCL 4 MG/2ML IJ SOLN
4.0000 mg | Freq: Once | INTRAMUSCULAR | Status: AC
  Administered 2014-03-23: 4 mg via INTRAVENOUS
  Filled 2014-03-23: qty 2

## 2014-03-23 MED ORDER — FAMOTIDINE 20 MG PO TABS: 20.0000 mg | ORAL_TABLET | Freq: Two times a day (BID) | ORAL | Status: DC

## 2014-03-23 MED ORDER — HYDROMORPHONE HCL 1 MG/ML IJ SOLN
1.0000 mg | Freq: Once | INTRAMUSCULAR | Status: AC
  Administered 2014-03-23: 1 mg via INTRAVENOUS
  Filled 2014-03-23: qty 1

## 2014-03-23 MED ORDER — SODIUM CHLORIDE 0.9 % IV BOLUS (SEPSIS)
1000.0000 mL | Freq: Once | INTRAVENOUS | Status: AC
  Administered 2014-03-23: 1000 mL via INTRAVENOUS

## 2014-03-23 MED ORDER — HYDROCODONE-ACETAMINOPHEN 5-325 MG PO TABS: 1.0000 | ORAL_TABLET | Freq: Four times a day (QID) | ORAL | Status: DC | PRN

## 2014-03-23 MED ORDER — PROMETHAZINE HCL 25 MG PO TABS: 25.0000 mg | ORAL_TABLET | Freq: Four times a day (QID) | ORAL | Status: DC | PRN

## 2014-03-23 MED ORDER — SODIUM CHLORIDE 0.9 % IV SOLN
INTRAVENOUS | Status: DC
  Administered 2014-03-23: 09:00:00 via INTRAVENOUS

## 2014-03-23 MED ORDER — IOHEXOL 300 MG/ML  SOLN
50.0000 mL | Freq: Once | INTRAMUSCULAR | Status: AC | PRN
  Administered 2014-03-23: 50 mL via ORAL

## 2014-03-23 MED ORDER — IOHEXOL 300 MG/ML  SOLN
100.0000 mL | Freq: Once | INTRAMUSCULAR | Status: AC | PRN
  Administered 2014-03-23: 100 mL via INTRAVENOUS

## 2014-03-23 NOTE — ED Provider Notes (Addendum)
CSN: [id number]     Arrival date & time 03/23/14  9509 History   First MD Initiated Contact with Patient 03/23/14 [phone]     Chief Complaint  Patient presents with  . Abdominal Pain  . Back Pain     (Consider location/radiation/quality/duration/timing/severity/associated sxs/prior Treatment) Patient is a 38 y.o. male presenting with abdominal pain and back pain. The history is provided by the patient.  Abdominal Pain Associated symptoms: nausea and vomiting   Associated symptoms: no chest pain, no dysuria, no fever, no hematuria and no shortness of breath   Back Pain Associated symptoms: abdominal pain   Associated symptoms: no chest pain, no dysuria, no fever and no headaches    patient with history of pancreatitis. Patient with acute onset of epigastric abdominal pain radiating to the back. The pain is 10 out of 10 this occurred at 3:30 in the morning. Associated with multiple episodes of vomiting. Reminds patient of his past problems with pancreatitis. No vomiting blood. No fever. Pain is described as sharp.  Past Medical History  Diagnosis Date  . Pancreatitis   . Pancreatitis   . Kidney stones    Past Surgical History  Procedure Laterality Date  . Appendectomy    . Cholecystectomy    . Kidney stone surgery    . Ercp w/ metal stent placement     No family history on file. History  Substance Use Topics  . Smoking status: Current Every Day Smoker -- 0.50 packs/day    Types: Cigarettes  . Smokeless tobacco: Never Used  . Alcohol Use: Yes     Comment: rarely, pt stated that he drinks once ever 3 months and it is usually a beer at dinner    Review of Systems  Constitutional: Negative for fever.  HENT: Negative for congestion.   Eyes: Negative for redness.  Respiratory: Negative for shortness of breath.   Cardiovascular: Negative for chest pain.  Gastrointestinal: Positive for nausea, vomiting and abdominal pain.  Genitourinary: Negative for dysuria and hematuria.   Musculoskeletal: Positive for back pain.  Skin: Negative for rash.  Neurological: Negative for headaches.  Hematological: Does not bruise/bleed easily.  Psychiatric/Behavioral: Negative for confusion.      Allergies  Cortisone; Eggs or egg-derived products; and Ketorolac tromethamine  Home Medications   Prior to Admission medications   Medication Sig Start Date End Date Taking? Authorizing Provider  Doxylamine-Phenylephrine-APAP (VICKS NYQUIL SINUS) 6.25-5-325 MG CAPS Take 1 capsule by mouth daily as needed (for sinus).   Yes Historical Provider, MD  fentaNYL (DURAGESIC - DOSED MCG/HR) 25 MCG/HR Place 1 patch onto the skin every other day.    Yes Historical Provider, MD  Melatonin 5 MG TABS Take 5 mg by mouth at bedtime as needed (for sleep).   Yes Historical Provider, MD  tapentadol (NUCYNTA) 50 MG TABS Take 100 mg by mouth every 6 (six) hours as needed for moderate pain.    Yes Historical Provider, MD  Cyanocobalamin (B-12 PO) Take 1 tablet by mouth daily.    Historical Provider, MD  famotidine (PEPCID) 20 MG tablet Take 1 tablet (20 mg total) by mouth 2 (two) times daily. 03/23/14   Fredia Sorrow, MD  HYDROcodone-acetaminophen (NORCO/VICODIN) 5-325 MG per tablet Take 1-2 tablets by mouth every 6 (six) hours as needed for moderate pain. 03/23/14   Fredia Sorrow, MD  promethazine (PHENERGAN) 12.5 MG tablet Take 12.5 mg by mouth every 6 (six) hours as needed for nausea or vomiting.    Historical Provider, MD  promethazine (PHENERGAN) 25 MG tablet Take 1 tablet (25 mg total) by mouth every 6 (six) hours as needed for nausea or vomiting. 10/09/13   Malvin Johns, MD  promethazine (PHENERGAN) 25 MG tablet Take 1 tablet (25 mg total) by mouth every 6 (six) hours as needed for nausea or vomiting. 03/23/14   Fredia Sorrow, MD   BP 166/109  Pulse 84  Temp(Src) 98.7 F (37.1 C) (Oral)  Resp 20  SpO2 99% Physical Exam  Nursing note and vitals reviewed. Constitutional: He is oriented  to person, place, and time. He appears well-developed and well-nourished. No distress.  HENT:  Head: Normocephalic and atraumatic.  Mouth/Throat: Oropharynx is clear and moist.  Eyes: Conjunctivae and EOM are normal. Pupils are equal, round, and reactive to light.  Neck: Normal range of motion.  Cardiovascular: Normal rate, regular rhythm and normal heart sounds.   No murmur heard. Pulmonary/Chest: Effort normal and breath sounds normal. No respiratory distress.  Abdominal: Soft. Bowel sounds are normal. He exhibits no mass. There is no tenderness. There is no guarding.  Musculoskeletal: Normal range of motion.  Neurological: He is alert and oriented to person, place, and time. No cranial nerve deficit. He exhibits normal muscle tone. Coordination normal.  Skin: Skin is warm. No rash noted.    ED Course  Procedures (including critical care time) Labs Review Labs Reviewed  COMPREHENSIVE METABOLIC PANEL - Abnormal; Notable for the following:    Glucose, Bld 101 (*)    All other components within normal limits  LIPASE, BLOOD  CBC WITH DIFFERENTIAL  URINALYSIS, ROUTINE W REFLEX MICROSCOPIC    Imaging Review Ct Abdomen Pelvis W Contrast  03/23/2014   CLINICAL DATA:  Generalized chronic abdominal pain and tenderness, history of stones and pancreatitis, post appendectomy, postcholecystectomy  EXAM: CT ABDOMEN AND PELVIS WITH CONTRAST  TECHNIQUE: Multidetector CT imaging of the abdomen and pelvis was performed using the standard protocol following bolus administration of intravenous contrast.  CONTRAST:  23mL OMNIPAQUE IOHEXOL 300 MG/ML SOLN, 157mL OMNIPAQUE IOHEXOL 300 MG/ML SOLN  COMPARISON:  06/12/2013  FINDINGS: Lung bases are unremarkable. Sagittal images of the spine shows mild degenerative changes lower thoracic spine.  The patient is status postcholecystectomy. Mild intrahepatic biliary ductal dilatation probable postcholecystectomy. CBD measures 8 mm in diameter.  Pancreas, spleen and  adrenal glands are unremarkable. There is no evidence of pancreatitis. Kidneys are symmetrical in size and enhancement. No hydronephrosis or hydroureter. No focal renal mass.  No small bowel obstruction.  No ascites or free air.  No adenopathy.  No aortic aneurysm.  No pericecal inflammation. The patient is status post appendectomy. The terminal ileum is unremarkable. Some stool noted in sigmoid colon. No colitis or diverticulitis. Bilateral distal ureter is unremarkable. Prostate gland calcifications are noted. Seminal vesicles are unremarkable. No calcified calculi are noted within urinary bladder. Pelvic phleboliths are noted. There is no inguinal adenopathy. No destructive bony lesions are noted within pelvis.  IMPRESSION: 1. Status postcholecystectomy. Mild intrahepatic biliary ductal dilatation probable postcholecystectomy. 2. No acute inflammatory process within abdomen or pelvis. 3. No hydronephrosis or hydroureter. 4. No pericecal inflammation.  Status post appendectomy. 5. Prostate gland calcifications are noted. 6. No evidence of acute pancreatitis.   Electronically Signed   By: Lahoma Crocker M.D.   On: 03/23/2014 10:49     EKG Interpretation None      MDM   Final diagnoses:  Abdominal pain    Workup negative for pancreatitis negative for kidney stone. Possibility of stomach ulcer  still present. We'll treat with Pepcid. Antinausea medicine and pain medicine. Work note provided. Based on labs and CT not consistent with pancreatitis. Patient improved here with pain medicine and fluids. For the possibility of peptic ulcer disease will treat with Pepcid.    Fredia Sorrow, MD 03/23/14 [place], MD 03/23/14 1103

## 2014-03-23 NOTE — ED Notes (Signed)
Pt stated that he has chronic pancreatitis and has not felt well since early in the weekend. Pt stated that he was at work at 330 this morning and he started having severe abdominal pain and back pain and N/V. Pt stated that he drove home at 530 and had multiple vomiting episodes.

## 2014-03-23 NOTE — Discharge Instructions (Signed)
Workup for the abdominal pain negative no evidence of any kidney stones or pancreatitis. Possibility of a stomach ulcer is still present. Start a course of Pepcid take over the next 2 weeks. Supplement with pain medication and antinausea medicine as needed. Make an appointment to follow-up with irregular doctor. Return for any new or worse symptoms. Work note provided.

## 2014-03-23 NOTE — ED Notes (Signed)
MD at bedside with this RN 

## 2014-04-30 ENCOUNTER — Emergency Department (HOSPITAL_BASED_OUTPATIENT_CLINIC_OR_DEPARTMENT_OTHER)
Admission: EM | Admit: 2014-04-30 | Discharge: 2014-05-01 | Disposition: A | Discharge status: Home / Self Care | Payer: BC Managed Care – PPO | Attending: Emergency Medicine | Admitting: Emergency Medicine

## 2014-04-30 ENCOUNTER — Encounter (HOSPITAL_BASED_OUTPATIENT_CLINIC_OR_DEPARTMENT_OTHER): Payer: Self-pay

## 2014-04-30 DIAGNOSIS — Z720 Tobacco use: Secondary | ICD-10-CM | POA: Diagnosis not present

## 2014-04-30 DIAGNOSIS — Z9889 Other specified postprocedural states: Secondary | ICD-10-CM | POA: Diagnosis not present

## 2014-04-30 DIAGNOSIS — Z79899 Other long term (current) drug therapy: Secondary | ICD-10-CM | POA: Insufficient documentation

## 2014-04-30 DIAGNOSIS — Z87442 Personal history of urinary calculi: Secondary | ICD-10-CM | POA: Insufficient documentation

## 2014-04-30 DIAGNOSIS — Z9089 Acquired absence of other organs: Secondary | ICD-10-CM | POA: Diagnosis not present

## 2014-04-30 DIAGNOSIS — K861 Other chronic pancreatitis: Secondary | ICD-10-CM

## 2014-04-30 DIAGNOSIS — R109 Unspecified abdominal pain: Secondary | ICD-10-CM | POA: Diagnosis present

## 2014-04-30 LAB — URINE MICROSCOPIC-ADD ON

## 2014-04-30 LAB — URINALYSIS, ROUTINE W REFLEX MICROSCOPIC
BILIRUBIN URINE: NEGATIVE
Glucose, UA: NEGATIVE mg/dL
Hgb urine dipstick: NEGATIVE
KETONES UR: NEGATIVE mg/dL
NITRITE: NEGATIVE
PH: 6.5 (ref 5.0–8.0)
Protein, ur: NEGATIVE mg/dL
Specific Gravity, Urine: 1.015 (ref 1.005–1.030)
Urobilinogen, UA: 1 mg/dL (ref 0.0–1.0)

## 2014-04-30 NOTE — ED Notes (Signed)
Pt reports hx of chronic pancreatitis, reports starting yesterday with n/v and pain in abd.

## 2014-05-01 ENCOUNTER — Encounter (HOSPITAL_BASED_OUTPATIENT_CLINIC_OR_DEPARTMENT_OTHER): Payer: Self-pay | Admitting: Emergency Medicine

## 2014-05-01 LAB — CBC WITH DIFFERENTIAL/PLATELET
Basophils Absolute: 0 10*3/uL (ref 0.0–0.1)
Basophils Relative: 0 % (ref 0–1)
Eosinophils Absolute: 0.3 10*3/uL (ref 0.0–0.7)
Eosinophils Relative: 3 % (ref 0–5)
HCT: 41.2 % (ref 39.0–52.0)
HEMOGLOBIN: 14.4 g/dL (ref 13.0–17.0)
LYMPHS ABS: 3.4 10*3/uL (ref 0.7–4.0)
Lymphocytes Relative: 37 % (ref 12–46)
MCH: 29.9 pg (ref 26.0–34.0)
MCHC: 35 g/dL (ref 30.0–36.0)
MCV: 85.7 fL (ref 78.0–100.0)
Monocytes Absolute: 0.7 10*3/uL (ref 0.1–1.0)
Monocytes Relative: 8 % (ref 3–12)
NEUTROS ABS: 4.9 10*3/uL (ref 1.7–7.7)
NEUTROS PCT: 52 % (ref 43–77)
Platelets: 189 10*3/uL (ref 150–400)
RBC: 4.81 MIL/uL (ref 4.22–5.81)
RDW: 12.5 % (ref 11.5–15.5)
WBC: 9.3 10*3/uL (ref 4.0–10.5)

## 2014-05-01 LAB — COMPREHENSIVE METABOLIC PANEL
ALK PHOS: 64 U/L (ref 39–117)
ALT: 13 U/L (ref 0–53)
AST: 19 U/L (ref 0–37)
Albumin: 4.1 g/dL (ref 3.5–5.2)
Anion gap: 12 (ref 5–15)
BUN: 12 mg/dL (ref 6–23)
CHLORIDE: 105 meq/L (ref 96–112)
CO2: 25 mEq/L (ref 19–32)
Calcium: 9.6 mg/dL (ref 8.4–10.5)
Creatinine, Ser: 0.8 mg/dL (ref 0.50–1.35)
GFR calc non Af Amer: >90 mL/min (ref >90)
GLUCOSE: 92 mg/dL (ref 70–99)
POTASSIUM: 3.9 meq/L (ref 3.7–5.3)
Sodium: 142 mEq/L (ref 137–147)
Total Bilirubin: 0.5 mg/dL (ref 0.3–1.2)
Total Protein: 7.1 g/dL (ref 6.0–8.3)

## 2014-05-01 LAB — LIPASE, BLOOD: Lipase: 215 U/L — ABNORMAL HIGH (ref 11–59)

## 2014-05-01 MED ORDER — HYDROMORPHONE HCL 1 MG/ML IJ SOLN
1.0000 mg | Freq: Once | INTRAMUSCULAR | Status: AC
  Administered 2014-05-01: 1 mg via INTRAVENOUS

## 2014-05-01 MED ORDER — SODIUM CHLORIDE 0.9 % IV BOLUS (SEPSIS)
1000.0000 mL | Freq: Once | INTRAVENOUS | Status: AC
  Administered 2014-05-01: 1000 mL via INTRAVENOUS

## 2014-05-01 MED ORDER — ONDANSETRON HCL 4 MG/2ML IJ SOLN
4.0000 mg | Freq: Once | INTRAMUSCULAR | Status: AC
  Administered 2014-05-01: 4 mg via INTRAVENOUS

## 2014-05-01 NOTE — ED Provider Notes (Signed)
CSN: [id number]     Arrival date & time 04/30/14  2149 History   First MD Initiated Contact with Patient 05/01/14 0053     Chief Complaint  Patient presents with  . Abdominal Pain     (Consider location/radiation/quality/duration/timing/severity/associated sxs/prior Treatment) HPI  This is a 38 year old male with chronic pancreatitis on chronic fentanyl and Nucynta managed by the [facility]. He is here with an acute exacerbation consistent with prior exacerbations of pancreatitis. It began 2 nights ago with nausea and vomiting and progressed to pain yesterday. He has not had anything to eat or drink the past 2 days. He denies diarrhea. He states the pain is moderate to severe and worse with movement or palpation. He is taking his fentanyl patches and Nucynta tablets as prescribed.  Past Medical History  Diagnosis Date  . Pancreatitis   . Kidney stones    Past Surgical History  Procedure Laterality Date  . Appendectomy    . Cholecystectomy    . Kidney stone surgery    . Ercp w/ metal stent placement     No family history on file. History  Substance Use Topics  . Smoking status: Current Every Day Smoker -- 0.50 packs/day    Types: Cigarettes  . Smokeless tobacco: Never Used  . Alcohol Use: Yes     Comment: rarely, pt stated that he drinks once ever 3 months and it is usually a beer at dinner    Review of Systems  All other systems reviewed and are negative.   Allergies  Cortisone; Eggs or egg-derived products; and Ketorolac tromethamine  Home Medications   Prior to Admission medications   Medication Sig Start Date End Date Taking? Authorizing Provider  Cyanocobalamin (B-12 PO) Take 1 tablet by mouth daily.    Historical Provider, MD  Doxylamine-Phenylephrine-APAP (VICKS NYQUIL SINUS) 6.25-5-325 MG CAPS Take 1 capsule by mouth daily as needed (for sinus).    Historical Provider, MD  famotidine (PEPCID) 20 MG tablet Take 1 tablet (20 mg total) by mouth 2  (two) times daily. 03/23/14   Fredia Sorrow, MD  fentaNYL (DURAGESIC - DOSED MCG/HR) 25 MCG/HR Place 50 mcg onto the skin every other day.     Historical Provider, MD  HYDROcodone-acetaminophen (NORCO/VICODIN) 5-325 MG per tablet Take 1-2 tablets by mouth every 6 (six) hours as needed for moderate pain. 03/23/14   Fredia Sorrow, MD  Melatonin 5 MG TABS Take 5 mg by mouth at bedtime as needed (for sleep).    Historical Provider, MD  promethazine (PHENERGAN) 12.5 MG tablet Take 12.5 mg by mouth every 6 (six) hours as needed for nausea or vomiting.    Historical Provider, MD  promethazine (PHENERGAN) 25 MG tablet Take 1 tablet (25 mg total) by mouth every 6 (six) hours as needed for nausea or vomiting. 10/09/13   Malvin Johns, MD  promethazine (PHENERGAN) 25 MG tablet Take 1 tablet (25 mg total) by mouth every 6 (six) hours as needed for nausea or vomiting. 03/23/14   Fredia Sorrow, MD  tapentadol (NUCYNTA) 50 MG TABS Take 100 mg by mouth every 6 (six) hours as needed for moderate pain.     Historical Provider, MD   BP 144/94 mmHg  Pulse 83  Temp(Src) 98.6 F (37 C) (Oral)  Resp 20  Ht 6\' 1"  (1.854 m)  Wt 195 lb (88.451 kg)  BMI 25.73 kg/m2  SpO2 96%   Physical Exam  General: Well-developed, well-nourished male in no acute distress; appearance consistent with age  of record HENT: normocephalic; atraumatic; dry mucous membranes Eyes: pupils equal, round and reactive to light; extraocular muscles intact Neck: supple Heart: regular rate and rhythm Lungs: clear to auscultation bilaterally Abdomen: soft; nondistended; epigastric tenderness; no masses or hepatosplenomegaly; bowel sounds present Extremities: No deformity; full range of motion; pulses normal Neurologic: Awake, alert and oriented; motor function intact in all extremities and symmetric; no facial droop Skin: Warm and dry; fentanyl patch on abdomen Psychiatric: Normal mood and affect    ED Course  Procedures (including  critical care time)   MDM   Nursing notes and vitals signs, including pulse oximetry, reviewed.  Summary of this visit's results, reviewed by myself:  Labs:  Results for orders placed or performed during the hospital encounter of 04/30/14 (from the past 24 hour(s))  Urinalysis, Routine w reflex microscopic     Status: Abnormal   Collection Time: 04/30/14 11:15 PM  Result Value Ref Range   Color, Urine YELLOW YELLOW   APPearance CLEAR CLEAR   Specific Gravity, Urine 1.015 1.005 - 1.030   pH 6.5 5.0 - 8.0   Glucose, UA NEGATIVE NEGATIVE mg/dL   Hgb urine dipstick NEGATIVE NEGATIVE   Bilirubin Urine NEGATIVE NEGATIVE   Ketones, ur NEGATIVE NEGATIVE mg/dL   Protein, ur NEGATIVE NEGATIVE mg/dL   Urobilinogen, UA 1.0 0.0 - 1.0 mg/dL   Nitrite NEGATIVE NEGATIVE   Leukocytes, UA TRACE (A) NEGATIVE  Urine microscopic-add on     Status: None   Collection Time: 04/30/14 11:15 PM  Result Value Ref Range   Squamous Epithelial / LPF RARE RARE   WBC, UA 3-6 <3 WBC/hpf   RBC / HPF 0-2 <3 RBC/hpf   Bacteria, UA RARE RARE  CBC with Differential     Status: None   Collection Time: 05/01/14  1:30 AM  Result Value Ref Range   WBC 9.3 4.0 - 10.5 K/uL   RBC 4.81 4.22 - 5.81 MIL/uL   Hemoglobin 14.4 13.0 - 17.0 g/dL   HCT 41.2 39.0 - 52.0 %   MCV 85.7 78.0 - 100.0 fL   MCH 29.9 26.0 - 34.0 pg   MCHC 35.0 30.0 - 36.0 g/dL   RDW 12.5 11.5 - 15.5 %   Platelets 189 150 - 400 K/uL   Neutrophils Relative % 52 43 - 77 %   Neutro Abs 4.9 1.7 - 7.7 K/uL   Lymphocytes Relative 37 12 - 46 %   Lymphs Abs 3.4 0.7 - 4.0 K/uL   Monocytes Relative 8 3 - 12 %   Monocytes Absolute 0.7 0.1 - 1.0 K/uL   Eosinophils Relative 3 0 - 5 %   Eosinophils Absolute 0.3 0.0 - 0.7 K/uL   Basophils Relative 0 0 - 1 %   Basophils Absolute 0.0 0.0 - 0.1 K/uL  Comprehensive metabolic panel     Status: None   Collection Time: 05/01/14  1:30 AM  Result Value Ref Range   Sodium 142 137 - 147 mEq/L   Potassium 3.9 3.7 -  5.3 mEq/L   Chloride 105 96 - 112 mEq/L   CO2 25 19 - 32 mEq/L   Glucose, Bld 92 70 - 99 mg/dL   BUN 12 6 - 23 mg/dL   Creatinine, Ser 0.80 0.50 - 1.35 mg/dL   Calcium 9.6 8.4 - 10.5 mg/dL   Total Protein 7.1 6.0 - 8.3 g/dL   Albumin 4.1 3.5 - 5.2 g/dL   AST 19 0 - 37 U/L   ALT 13 0 -  53 U/L   Alkaline Phosphatase 64 39 - 117 U/L   Total Bilirubin 0.5 0.3 - 1.2 mg/dL   GFR calc non Af Amer >90 >90 mL/min   GFR calc Af Amer >90 >90 mL/min   Anion gap 12 5 - 15  Lipase, blood     Status: Abnormal   Collection Time: 05/01/14  1:30 AM  Result Value Ref Range   Lipase 215 (H) 11 - 59 U/L   2:22 AM Pain improved with IV Dilaudid. He will contact his pain management physician on Monday.   Wynetta Fines, MD 05/01/14 [phone]

## 2014-05-04 ENCOUNTER — Emergency Department (HOSPITAL_BASED_OUTPATIENT_CLINIC_OR_DEPARTMENT_OTHER)
Admission: EM | Admit: 2014-05-04 | Discharge: 2014-05-04 | Disposition: A | Discharge status: Home / Self Care | Payer: BC Managed Care – PPO | Attending: Emergency Medicine | Admitting: Emergency Medicine

## 2014-05-04 ENCOUNTER — Emergency Department (HOSPITAL_BASED_OUTPATIENT_CLINIC_OR_DEPARTMENT_OTHER): Payer: BC Managed Care – PPO

## 2014-05-04 ENCOUNTER — Encounter (HOSPITAL_BASED_OUTPATIENT_CLINIC_OR_DEPARTMENT_OTHER): Payer: Self-pay | Admitting: *Deleted

## 2014-05-04 DIAGNOSIS — Z720 Tobacco use: Secondary | ICD-10-CM | POA: Diagnosis not present

## 2014-05-04 DIAGNOSIS — R1012 Left upper quadrant pain: Secondary | ICD-10-CM

## 2014-05-04 DIAGNOSIS — Z87442 Personal history of urinary calculi: Secondary | ICD-10-CM | POA: Diagnosis not present

## 2014-05-04 DIAGNOSIS — R1110 Vomiting, unspecified: Secondary | ICD-10-CM | POA: Insufficient documentation

## 2014-05-04 DIAGNOSIS — Z8719 Personal history of other diseases of the digestive system: Secondary | ICD-10-CM | POA: Diagnosis not present

## 2014-05-04 DIAGNOSIS — Z79899 Other long term (current) drug therapy: Secondary | ICD-10-CM | POA: Diagnosis not present

## 2014-05-04 DIAGNOSIS — Z9089 Acquired absence of other organs: Secondary | ICD-10-CM | POA: Insufficient documentation

## 2014-05-04 DIAGNOSIS — R630 Anorexia: Secondary | ICD-10-CM | POA: Diagnosis not present

## 2014-05-04 DIAGNOSIS — G8929 Other chronic pain: Secondary | ICD-10-CM | POA: Insufficient documentation

## 2014-05-04 LAB — COMPREHENSIVE METABOLIC PANEL
ALT: 16 U/L (ref 0–53)
AST: 21 U/L (ref 0–37)
Albumin: 4.8 g/dL (ref 3.5–5.2)
Alkaline Phosphatase: 76 U/L (ref 39–117)
Anion gap: 16 — ABNORMAL HIGH (ref 5–15)
BILIRUBIN TOTAL: 0.5 mg/dL (ref 0.3–1.2)
BUN: 17 mg/dL (ref 6–23)
CO2: 28 meq/L (ref 19–32)
Calcium: 10.3 mg/dL (ref 8.4–10.5)
Chloride: 99 mEq/L (ref 96–112)
Creatinine, Ser: 0.9 mg/dL (ref 0.50–1.35)
GFR calc Af Amer: >90 mL/min (ref >90)
GLUCOSE: 113 mg/dL — AB (ref 70–99)
Potassium: 3.6 mEq/L — ABNORMAL LOW (ref 3.7–5.3)
SODIUM: 143 meq/L (ref 137–147)
Total Protein: 8.4 g/dL — ABNORMAL HIGH (ref 6.0–8.3)

## 2014-05-04 LAB — CBC WITH DIFFERENTIAL/PLATELET
Basophils Absolute: 0 10*3/uL (ref 0.0–0.1)
Basophils Relative: 0 % (ref 0–1)
EOS ABS: 0.2 10*3/uL (ref 0.0–0.7)
EOS PCT: 2 % (ref 0–5)
HCT: 48.4 % (ref 39.0–52.0)
Hemoglobin: 17.4 g/dL — ABNORMAL HIGH (ref 13.0–17.0)
Lymphocytes Relative: 41 % (ref 12–46)
Lymphs Abs: 5.1 10*3/uL — ABNORMAL HIGH (ref 0.7–4.0)
MCH: 30.1 pg (ref 26.0–34.0)
MCHC: 36 g/dL (ref 30.0–36.0)
MCV: 83.6 fL (ref 78.0–100.0)
Monocytes Absolute: 1.2 10*3/uL — ABNORMAL HIGH (ref 0.1–1.0)
Monocytes Relative: 10 % (ref 3–12)
Neutro Abs: 5.9 10*3/uL (ref 1.7–7.7)
Neutrophils Relative %: 47 % (ref 43–77)
PLATELETS: 235 10*3/uL (ref 150–400)
RBC: 5.79 MIL/uL (ref 4.22–5.81)
RDW: 12.8 % (ref 11.5–15.5)
WBC: 12.4 10*3/uL — ABNORMAL HIGH (ref 4.0–10.5)

## 2014-05-04 LAB — URINALYSIS, ROUTINE W REFLEX MICROSCOPIC
Bilirubin Urine: NEGATIVE
GLUCOSE, UA: NEGATIVE mg/dL
HGB URINE DIPSTICK: NEGATIVE
Ketones, ur: NEGATIVE mg/dL
Leukocytes, UA: NEGATIVE
Nitrite: NEGATIVE
Protein, ur: NEGATIVE mg/dL
SPECIFIC GRAVITY, URINE: 1.023 (ref 1.005–1.030)
Urobilinogen, UA: 0.2 mg/dL (ref 0.0–1.0)
pH: 5 (ref 5.0–8.0)

## 2014-05-04 LAB — LIPASE, BLOOD: Lipase: 94 U/L — ABNORMAL HIGH (ref 11–59)

## 2014-05-04 MED ORDER — HYDROMORPHONE HCL 1 MG/ML IJ SOLN
1.0000 mg | Freq: Once | INTRAMUSCULAR | Status: AC
  Administered 2014-05-04: 1 mg via INTRAVENOUS
  Filled 2014-05-04: qty 1

## 2014-05-04 MED ORDER — SODIUM CHLORIDE 0.9 % IV BOLUS (SEPSIS)
1000.0000 mL | Freq: Once | INTRAVENOUS | Status: AC
  Administered 2014-05-04: 1000 mL via INTRAVENOUS

## 2014-05-04 MED ORDER — FENTANYL CITRATE 0.05 MG/ML IJ SOLN
100.0000 ug | Freq: Once | INTRAMUSCULAR | Status: AC
  Administered 2014-05-04: 100 ug via INTRAVENOUS
  Filled 2014-05-04: qty 2

## 2014-05-04 MED ORDER — HYDROMORPHONE HCL 1 MG/ML IJ SOLN
1.0000 mg | Freq: Once | INTRAMUSCULAR | Status: AC
Start: 2014-05-04 — End: 2014-05-04
  Administered 2014-05-04: 1 mg via INTRAVENOUS
  Filled 2014-05-04: qty 1

## 2014-05-04 MED ORDER — SUCRALFATE 1 GM/10ML PO SUSP: 1.0000 g | Freq: Three times a day (TID) | ORAL | Status: DC

## 2014-05-04 MED ORDER — HYDROMORPHONE HCL 1 MG/ML IJ SOLN: 1.0000 mg | Freq: Once | INTRAMUSCULAR | Status: DC

## 2014-05-04 MED ORDER — POTASSIUM CHLORIDE CRYS ER 20 MEQ PO TBCR
20.0000 meq | EXTENDED_RELEASE_TABLET | Freq: Once | ORAL | Status: AC
  Administered 2014-05-04: 20 meq via ORAL
  Filled 2014-05-04: qty 1

## 2014-05-04 MED ORDER — GI COCKTAIL ~~LOC~~
30.0000 mL | Freq: Once | ORAL | Status: AC
  Administered 2014-05-04: 30 mL via ORAL
  Filled 2014-05-04: qty 30

## 2014-05-04 MED ORDER — ONDANSETRON HCL 4 MG/2ML IJ SOLN
4.0000 mg | Freq: Once | INTRAMUSCULAR | Status: AC
  Administered 2014-05-04: 4 mg via INTRAVENOUS
  Filled 2014-05-04: qty 2

## 2014-05-04 MED ORDER — PROMETHAZINE HCL 25 MG PO TABS: 25.0000 mg | ORAL_TABLET | Freq: Four times a day (QID) | ORAL | Status: DC | PRN

## 2014-05-04 NOTE — ED Notes (Signed)
Pt given 120cc sprite for po challenge.

## 2014-05-04 NOTE — ED Notes (Addendum)
Pt resting quietly in nad

## 2014-05-04 NOTE — ED Notes (Signed)
Patient returned from X-ray 

## 2014-05-04 NOTE — Discharge Instructions (Signed)

## 2014-05-04 NOTE — ED Provider Notes (Signed)
CSN: [id number]     Arrival date & time 05/04/14  0750 History   First MD Initiated Contact with Patient 05/04/14 [phone]     Chief Complaint  Patient presents with  . Abdominal Pain      HPI Comments: [name] has a history of chronic pancreatitis. He's had multiple abdominal surgeries and nerve ablation for his chronic pain. He takes 50 g of fentanyl and 75 of Nucynta daily for the last 3 weeks which is a recent increase in this dosage. He developed recurrent left upper quadrant pain consistent with his chronic pancreatitis about a week ago. He was seen in the emergency department 4 days ago for this pain and was treated with pain medicine and IV fluids and his pain improved. His pain recurred last night and he has had retching and dry heaves since last night but no vomiting. He did have vomiting a week ago. He reports very poor oral intake for the last week due to his ongoing pain and just feeling poorly in general. He denies fevers, diarrhea, injuries, dysuria. He has prescriptions for his home medications and has not missed any doses. He has appointment with GI at [facility] on January 15.  Patient is a 38 y.o. male presenting with abdominal pain. The history is provided by the patient.  Abdominal Pain Pain location:  LUQ Pain quality: sharp   Pain radiates to:  Does not radiate Pain severity:  Severe Onset quality:  Gradual Duration:  1 week Timing:  Constant Progression:  Waxing and waning Chronicity:  Recurrent Context: not medication withdrawal, not suspicious food intake and not trauma   Relieved by:  Nothing Worsened by:  Eating Associated symptoms: anorexia and vomiting   Associated symptoms: no chest pain and no fever   Risk factors: multiple surgeries     Past Medical History  Diagnosis Date  . Pancreatitis   . Kidney stones    Past Surgical History  Procedure Laterality Date  . Appendectomy    . Cholecystectomy    . Kidney stone surgery    . Ercp w/ metal  stent placement     History reviewed. No pertinent family history. History  Substance Use Topics  . Smoking status: Current Every Day Smoker -- 0.50 packs/day    Types: Cigarettes  . Smokeless tobacco: Never Used  . Alcohol Use: Yes     Comment: rarely, pt stated that he drinks once ever 3 months and it is usually a beer at dinner    Review of Systems  Constitutional: Negative for fever.  Cardiovascular: Negative for chest pain.  Gastrointestinal: Positive for vomiting, abdominal pain and anorexia.  All other systems reviewed and are negative.     Allergies  Cortisone; Eggs or egg-derived products; and Ketorolac tromethamine  Home Medications   Prior to Admission medications   Medication Sig Start Date End Date Taking? Authorizing Provider  Cyanocobalamin (B-12 PO) Take 1 tablet by mouth daily.    Historical Provider, MD  Doxylamine-Phenylephrine-APAP (VICKS NYQUIL SINUS) 6.25-5-325 MG CAPS Take 1 capsule by mouth daily as needed (for sinus).    Historical Provider, MD  famotidine (PEPCID) 20 MG tablet Take 1 tablet (20 mg total) by mouth 2 (two) times daily. 03/23/14   Fredia Sorrow, MD  fentaNYL (DURAGESIC - DOSED MCG/HR) 25 MCG/HR Place 50 mcg onto the skin every other day.     Historical Provider, MD  HYDROcodone-acetaminophen (NORCO/VICODIN) 5-325 MG per tablet Take 1-2 tablets by mouth every 6 (six) hours  as needed for moderate pain. 03/23/14   Fredia Sorrow, MD  Melatonin 5 MG TABS Take 5 mg by mouth at bedtime as needed (for sleep).    Historical Provider, MD  promethazine (PHENERGAN) 12.5 MG tablet Take 12.5 mg by mouth every 6 (six) hours as needed for nausea or vomiting.    Historical Provider, MD  promethazine (PHENERGAN) 25 MG tablet Take 1 tablet (25 mg total) by mouth every 6 (six) hours as needed for nausea or vomiting. 10/09/13   Malvin Johns, MD  promethazine (PHENERGAN) 25 MG tablet Take 1 tablet (25 mg total) by mouth every 6 (six) hours as needed for  nausea or vomiting. 03/23/14   Fredia Sorrow, MD  tapentadol (NUCYNTA) 50 MG TABS Take 100 mg by mouth every 6 (six) hours as needed for moderate pain.     Historical Provider, MD   BP 154/104 mmHg  Pulse 108  Temp(Src) 98.3 F (36.8 C) (Oral)  Resp 20  Ht 6\' 1"  (1.854 m)  Wt 195 lb (88.451 kg)  BMI 25.73 kg/m2  SpO2 98% Physical Exam  Constitutional: He is oriented to person, place, and time. He appears well-developed and well-nourished.  HENT:  Head: Normocephalic and atraumatic.  Cardiovascular: Normal rate.   No murmur heard. Pulmonary/Chest: Effort normal. No respiratory distress.  Abdominal: Soft. Bowel sounds are normal. There is no rebound and no guarding.  Mild to moderate LUQ tenderness without guarding or rebound tenderness  Musculoskeletal: He exhibits no edema or tenderness.  Neurological: He is alert and oriented to person, place, and time.  Skin: Skin is warm and dry.  Psychiatric: He has a normal mood and affect. His behavior is normal.  Nursing note and vitals reviewed.   ED Course  Procedures (including critical care time) Labs Review Labs Reviewed  COMPREHENSIVE METABOLIC PANEL - Abnormal; Notable for the following:    Potassium 3.6 (*)    Glucose, Bld 113 (*)    Total Protein 8.4 (*)    Anion gap 16 (*)    All other components within normal limits  LIPASE, BLOOD - Abnormal; Notable for the following:    Lipase 94 (*)    All other components within normal limits  CBC WITH DIFFERENTIAL - Abnormal; Notable for the following:    WBC 12.4 (*)    Hemoglobin 17.4 (*)    Lymphs Abs 5.1 (*)    Monocytes Absolute 1.2 (*)    All other components within normal limits  URINALYSIS, ROUTINE W REFLEX MICROSCOPIC    Imaging Review Dg Abd Acute W/chest  05/04/2014   CLINICAL DATA:  38 year old male with acute on chronic abdominal pain. Bursal history of pancreatitis. Initial encounter.  EXAM: ACUTE ABDOMEN SERIES (ABDOMEN 2 VIEW & CHEST 1 VIEW)  COMPARISON:  CT  Abdomen and Pelvis 03/23/2014 and earlier. Chest radiographs 10/05/2012 and earlier.  FINDINGS: Normal lung volumes. Normal cardiac size and mediastinal contours. Visualized tracheal air column is within normal limits. No pneumothorax or pneumoperitoneum. No confluent pulmonary opacity.  Stable cholecystectomy clips. Normal bowel gas pattern. Abdominal and pelvic visceral contours are stable. Pelvic phleboliths incidentally re - identified. No acute osseous abnormality identified.  IMPRESSION: 1.  Non obstructed bowel gas pattern, no free air. 2.  No acute cardiopulmonary abnormality.   Electronically Signed   By: Lars Pinks M.D.   On: 05/04/2014 09:38     EKG Interpretation None      MDM   Final diagnoses:  Left upper quadrant pain   Repeat  eval at 0915, pt reports partial improvement in pain but does have some continued LUQ pain.  No vomiting.   With history of chronic pancreatitis here for acute abdominal pain similar to prior pancreatitis flares. Patient with no peritoneal findings on abdominal exam, there is no vomiting in the emergency department and patient is tolerating by mouth. Current clinical picture is not consistent with serious bacterial infection, bowel traction, perforated viscus. Discussed with patient home care for acute on chronic abdominal pain and importance of GI follow-up. Prescription for Phenergan, Carafate given. Patient states he has omeprazole at home that he is taking. Patient does appear to be mildly dehydrated and volume contracted on his CBC compared to priors.    Quintella Reichert, MD 05/04/14 1630

## 2014-05-04 NOTE — ED Notes (Signed)
Pt amb to room 10 with quick steady gait in nad. Pt reports "chronic pancreatitis", for which he is followed by pain clinic. Pt has fentanyl 53mcg patches in place x 2., and took Nycunta at 3am with no relief. Pt states he was seen here on Friday for same, and cont with pain.

## 2014-07-16 ENCOUNTER — Emergency Department (HOSPITAL_BASED_OUTPATIENT_CLINIC_OR_DEPARTMENT_OTHER)
Admission: EM | Admit: 2014-07-16 | Discharge: 2014-07-17 | Disposition: A | Discharge status: Home / Self Care | Payer: BLUE CROSS/BLUE SHIELD | Attending: Emergency Medicine | Admitting: Emergency Medicine

## 2014-07-16 ENCOUNTER — Encounter (HOSPITAL_BASED_OUTPATIENT_CLINIC_OR_DEPARTMENT_OTHER): Payer: Self-pay | Admitting: Emergency Medicine

## 2014-07-16 DIAGNOSIS — K8500 Idiopathic acute pancreatitis without necrosis or infection: Secondary | ICD-10-CM

## 2014-07-16 DIAGNOSIS — K861 Other chronic pancreatitis: Secondary | ICD-10-CM | POA: Insufficient documentation

## 2014-07-16 DIAGNOSIS — Z79899 Other long term (current) drug therapy: Secondary | ICD-10-CM | POA: Insufficient documentation

## 2014-07-16 DIAGNOSIS — Z720 Tobacco use: Secondary | ICD-10-CM | POA: Diagnosis not present

## 2014-07-16 DIAGNOSIS — R109 Unspecified abdominal pain: Secondary | ICD-10-CM | POA: Diagnosis present

## 2014-07-16 DIAGNOSIS — Z87442 Personal history of urinary calculi: Secondary | ICD-10-CM | POA: Diagnosis not present

## 2014-07-16 DIAGNOSIS — Z9049 Acquired absence of other specified parts of digestive tract: Secondary | ICD-10-CM | POA: Diagnosis not present

## 2014-07-16 LAB — CBC WITH DIFFERENTIAL/PLATELET
Basophils Absolute: 0 10*3/uL (ref 0.0–0.1)
Basophils Relative: 0 % (ref 0–1)
EOS ABS: 0.3 10*3/uL (ref 0.0–0.7)
EOS PCT: 4 % (ref 0–5)
HEMATOCRIT: 41.4 % (ref 39.0–52.0)
HEMOGLOBIN: 14.7 g/dL (ref 13.0–17.0)
LYMPHS ABS: 3.3 10*3/uL (ref 0.7–4.0)
Lymphocytes Relative: 35 % (ref 12–46)
MCH: 30.1 pg (ref 26.0–34.0)
MCHC: 35.5 g/dL (ref 30.0–36.0)
MCV: 84.8 fL (ref 78.0–100.0)
MONO ABS: 0.9 10*3/uL (ref 0.1–1.0)
MONOS PCT: 10 % (ref 3–12)
Neutro Abs: 4.8 10*3/uL (ref 1.7–7.7)
Neutrophils Relative %: 51 % (ref 43–77)
PLATELETS: 179 10*3/uL (ref 150–400)
RBC: 4.88 MIL/uL (ref 4.22–5.81)
RDW: 12.1 % (ref 11.5–15.5)
WBC: 9.4 10*3/uL (ref 4.0–10.5)

## 2014-07-16 MED ORDER — SODIUM CHLORIDE 0.9 % IV BOLUS (SEPSIS)
1000.0000 mL | Freq: Once | INTRAVENOUS | Status: AC
  Administered 2014-07-16: 1000 mL via INTRAVENOUS

## 2014-07-16 MED ORDER — HYDROMORPHONE HCL 1 MG/ML IJ SOLN
1.0000 mg | Freq: Once | INTRAMUSCULAR | Status: AC
  Administered 2014-07-16: 1 mg via INTRAVENOUS
  Filled 2014-07-16: qty 1

## 2014-07-16 MED ORDER — ONDANSETRON HCL 4 MG/2ML IJ SOLN
4.0000 mg | Freq: Once | INTRAMUSCULAR | Status: AC
  Administered 2014-07-16: 4 mg via INTRAVENOUS
  Filled 2014-07-16: qty 2

## 2014-07-16 NOTE — ED Provider Notes (Addendum)
CSN: [id number]     Arrival date & time 07/16/14  2317 History  This chart was scribed for Wynetta Fines, MD by Rayfield Citizen, ED Scribe. This patient was seen in room MH12/MH12 and the patient's care was started at 11:26 PM.    Chief Complaint  Patient presents with  . Abdominal Pain   HPI   HPI Comments: [name] is a 39 y.o. male with ideopathic pancreatits (on chronic pain management: fentanyl patches, Nucynta) who presents to the Emergency Department complaining of several hours of "severe" epigastric abdominal pain, worse with movement and applied pressure. It is characterized as like previous episodes of pancreatitis. His pain did not improve with rest. He also notes recent "cold-like symptoms" including sinus congestion, cough and intermittent cold chills. He denies nausea, vomiting, diarrhea and SOB.   Past Medical History  Diagnosis Date  . Pancreatitis   . Kidney stones    Past Surgical History  Procedure Laterality Date  . Appendectomy    . Cholecystectomy    . Kidney stone surgery    . Ercp w/ metal stent placement     History reviewed. No pertinent family history. History  Substance Use Topics  . Smoking status: Current Every Day Smoker -- 0.50 packs/day    Types: Cigarettes  . Smokeless tobacco: Never Used  . Alcohol Use: Yes     Comment: rarely, pt stated that he drinks once ever 3 months and it is usually a beer at dinner    Review of Systems A complete 10 system review of systems was obtained and all systems are negative except as noted in the HPI and PMH.   Allergies  Cortisone; Eggs or egg-derived products; and Ketorolac tromethamine  Home Medications   Prior to Admission medications   Medication Sig Start Date End Date Taking? Authorizing Provider  Cyanocobalamin (B-12 PO) Take 1 tablet by mouth daily.    Historical Provider, MD  Doxylamine-Phenylephrine-APAP (VICKS NYQUIL SINUS) 6.25-5-325 MG CAPS Take 1 capsule by mouth daily as needed (for sinus).     Historical Provider, MD  famotidine (PEPCID) 20 MG tablet Take 1 tablet (20 mg total) by mouth 2 (two) times daily. 03/23/14   Fredia Sorrow, MD  fentaNYL (DURAGESIC - DOSED MCG/HR) 25 MCG/HR Place 50 mcg onto the skin every other day.     Historical Provider, MD  HYDROcodone-acetaminophen (NORCO/VICODIN) 5-325 MG per tablet Take 1-2 tablets by mouth every 6 (six) hours as needed for moderate pain. 03/23/14   Fredia Sorrow, MD  Melatonin 5 MG TABS Take 5 mg by mouth at bedtime as needed (for sleep).    Historical Provider, MD  promethazine (PHENERGAN) 25 MG tablet Take 1 tablet (25 mg total) by mouth every 6 (six) hours as needed for nausea or vomiting. 05/04/14   Quintella Reichert, MD  sucralfate (CARAFATE) 1 GM/10ML suspension Take 10 mLs (1 g total) by mouth 4 (four) times daily -  with meals and at bedtime. 05/04/14   Quintella Reichert, MD  tapentadol (NUCYNTA) 50 MG TABS Take 100 mg by mouth every 6 (six) hours as needed for moderate pain.     Historical Provider, MD   BP 133/86 mmHg  Pulse 106  Temp(Src) 98.2 F (36.8 C)  Resp 18  Ht 6\' 4"  (1.93 m)  Wt 210 lb (95.255 kg)  BMI 25.57 kg/m2  SpO2 99%   Physical Exam General: Well-developed, well-nourished male in no acute distress; appearance consistent with age of record HENT: normocephalic; atraumatic Eyes: pupils  equal, round and reactive to light; extraocular muscles intact Neck: supple Heart: regular rate and rhythm; no murmurs, rubs or gallops Lungs: clear to auscultation bilaterally Abdomen: soft; nondistended; epigastric tenderness; no masses or hepatosplenomegaly; bowel sounds present Extremities: No deformity; full range of motion; pulses normal Neurologic: Awake, alert and oriented; motor function intact in all extremities and symmetric; no facial droop Skin: Warm and dry Psychiatric: Normal mood and affect  ED Course  Procedures   DIAGNOSTIC STUDIES: Oxygen Saturation is 99% on RA, normal by my interpretation.     COORDINATION OF CARE: 11:32 PM Discussed treatment plan with pt at bedside and pt agreed to plan.  MDM   Results for orders placed or performed during the hospital encounter of 07/16/14  CBC with Differential/Platelet  Result Value Ref Range   WBC 9.4 4.0 - 10.5 K/uL   RBC 4.88 4.22 - 5.81 MIL/uL   Hemoglobin 14.7 13.0 - 17.0 g/dL   HCT 41.4 39.0 - 52.0 %   MCV 84.8 78.0 - 100.0 fL   MCH 30.1 26.0 - 34.0 pg   MCHC 35.5 30.0 - 36.0 g/dL   RDW 12.1 11.5 - 15.5 %   Platelets 179 150 - 400 K/uL   Neutrophils Relative % 51 43 - 77 %   Neutro Abs 4.8 1.7 - 7.7 K/uL   Lymphocytes Relative 35 12 - 46 %   Lymphs Abs 3.3 0.7 - 4.0 K/uL   Monocytes Relative 10 3 - 12 %   Monocytes Absolute 0.9 0.1 - 1.0 K/uL   Eosinophils Relative 4 0 - 5 %   Eosinophils Absolute 0.3 0.0 - 0.7 K/uL   Basophils Relative 0 0 - 1 %   Basophils Absolute 0.0 0.0 - 0.1 K/uL  Comprehensive metabolic panel  Result Value Ref Range   Sodium 139 135 - 145 mmol/L   Potassium 3.7 3.5 - 5.1 mmol/L   Chloride 109 96 - 112 mmol/L   CO2 25 19 - 32 mmol/L   Glucose, Bld 98 70 - 99 mg/dL   BUN 11 6 - 23 mg/dL   Creatinine, Ser 0.71 0.50 - 1.35 mg/dL   Calcium 9.1 8.4 - 10.5 mg/dL   Total Protein 7.0 6.0 - 8.3 g/dL   Albumin 4.2 3.5 - 5.2 g/dL   AST 24 0 - 37 U/L   ALT 16 0 - 53 U/L   Alkaline Phosphatase 65 39 - 117 U/L   Total Bilirubin 0.4 0.3 - 1.2 mg/dL   GFR calc non Af Amer >90 >90 mL/min   GFR calc Af Amer >90 >90 mL/min   Anion gap 5 5 - 15  Lipase, blood  Result Value Ref Range   Lipase 96 (H) 11 - 59 U/L   12:55 AM Patient feeling better after IV medications. He is requesting a prescription for Phenergan tablets.  I personally performed the services described in this documentation, which was scribed in my presence. The recorded information has been reviewed and is accurate.     Wynetta Fines, MD 07/17/14 3888  Wynetta Fines, MD 07/17/14 [phone]

## 2014-07-16 NOTE — ED Notes (Signed)
Patient reports that he was not feeling well with sinus congestion and "cold" like symtoms for the last few days - now has abdominal pain/ The patient states that he thinks he is having a "pancreatis" attack

## 2014-07-17 ENCOUNTER — Emergency Department (HOSPITAL_BASED_OUTPATIENT_CLINIC_OR_DEPARTMENT_OTHER)
Admission: EM | Admit: 2014-07-17 | Discharge: 2014-07-18 | Disposition: A | Discharge status: Home / Self Care | Payer: BLUE CROSS/BLUE SHIELD | Attending: Emergency Medicine | Admitting: Emergency Medicine

## 2014-07-17 ENCOUNTER — Encounter (HOSPITAL_BASED_OUTPATIENT_CLINIC_OR_DEPARTMENT_OTHER): Payer: Self-pay | Admitting: *Deleted

## 2014-07-17 DIAGNOSIS — Z720 Tobacco use: Secondary | ICD-10-CM | POA: Diagnosis not present

## 2014-07-17 DIAGNOSIS — Z87442 Personal history of urinary calculi: Secondary | ICD-10-CM | POA: Diagnosis not present

## 2014-07-17 DIAGNOSIS — Z79899 Other long term (current) drug therapy: Secondary | ICD-10-CM | POA: Insufficient documentation

## 2014-07-17 DIAGNOSIS — K861 Other chronic pancreatitis: Secondary | ICD-10-CM | POA: Diagnosis not present

## 2014-07-17 DIAGNOSIS — R109 Unspecified abdominal pain: Secondary | ICD-10-CM | POA: Diagnosis present

## 2014-07-17 LAB — COMPREHENSIVE METABOLIC PANEL
ALT: 16 U/L (ref 0–53)
ANION GAP: 5 (ref 5–15)
AST: 24 U/L (ref 0–37)
Albumin: 4.2 g/dL (ref 3.5–5.2)
Alkaline Phosphatase: 65 U/L (ref 39–117)
BILIRUBIN TOTAL: 0.4 mg/dL (ref 0.3–1.2)
BUN: 11 mg/dL (ref 6–23)
CHLORIDE: 109 mmol/L (ref 96–112)
CO2: 25 mmol/L (ref 19–32)
Calcium: 9.1 mg/dL (ref 8.4–10.5)
Creatinine, Ser: 0.71 mg/dL (ref 0.50–1.35)
GFR calc non Af Amer: >90 mL/min (ref >90)
GLUCOSE: 98 mg/dL (ref 70–99)
Potassium: 3.7 mmol/L (ref 3.5–5.1)
SODIUM: 139 mmol/L (ref 135–145)
Total Protein: 7 g/dL (ref 6.0–8.3)

## 2014-07-17 LAB — LIPASE, BLOOD: Lipase: 96 U/L — ABNORMAL HIGH (ref 11–59)

## 2014-07-17 MED ORDER — HYDROMORPHONE HCL 1 MG/ML IJ SOLN
1.0000 mg | Freq: Once | INTRAMUSCULAR | Status: AC
  Administered 2014-07-17: 1 mg via INTRAVENOUS

## 2014-07-17 MED ORDER — HYDROMORPHONE HCL 1 MG/ML IJ SOLN
1.0000 mg | Freq: Once | INTRAMUSCULAR | Status: DC
  Filled 2014-07-17: qty 1

## 2014-07-17 MED ORDER — PROMETHAZINE HCL 25 MG PO TABS: 25.0000 mg | ORAL_TABLET | Freq: Four times a day (QID) | ORAL | Status: DC | PRN

## 2014-07-17 NOTE — ED Notes (Signed)
Pt reports epigastric pain radiating around is (L) side.  Worsening from last night.  Reports vomiting today.  Denies diarrhea.

## 2014-07-18 LAB — CBC WITH DIFFERENTIAL/PLATELET
BASOS PCT: 0 % (ref 0–1)
Basophils Absolute: 0 10*3/uL (ref 0.0–0.1)
EOS PCT: 3 % (ref 0–5)
Eosinophils Absolute: 0.3 10*3/uL (ref 0.0–0.7)
HEMATOCRIT: 42.7 % (ref 39.0–52.0)
Hemoglobin: 14.8 g/dL (ref 13.0–17.0)
LYMPHS PCT: 39 % (ref 12–46)
Lymphs Abs: 3.9 10*3/uL (ref 0.7–4.0)
MCH: 30.2 pg (ref 26.0–34.0)
MCHC: 34.7 g/dL (ref 30.0–36.0)
MCV: 87.1 fL (ref 78.0–100.0)
MONO ABS: 0.7 10*3/uL (ref 0.1–1.0)
Monocytes Relative: 6 % (ref 3–12)
NEUTROS ABS: 5.3 10*3/uL (ref 1.7–7.7)
NEUTROS PCT: 52 % (ref 43–77)
Platelets: 182 10*3/uL (ref 150–400)
RBC: 4.9 MIL/uL (ref 4.22–5.81)
RDW: 12.2 % (ref 11.5–15.5)
WBC: 10.2 10*3/uL (ref 4.0–10.5)

## 2014-07-18 LAB — COMPREHENSIVE METABOLIC PANEL
ALK PHOS: 58 U/L (ref 39–117)
ALT: 15 U/L (ref 0–53)
AST: 20 U/L (ref 0–37)
Albumin: 4 g/dL (ref 3.5–5.2)
Anion gap: 2 — ABNORMAL LOW (ref 5–15)
BUN: 10 mg/dL (ref 6–23)
CO2: 28 mmol/L (ref 19–32)
CREATININE: 0.85 mg/dL (ref 0.50–1.35)
Calcium: 8.8 mg/dL (ref 8.4–10.5)
Chloride: 108 mmol/L (ref 96–112)
GFR calc non Af Amer: >90 mL/min (ref >90)
GLUCOSE: 94 mg/dL (ref 70–99)
Potassium: 3.6 mmol/L (ref 3.5–5.1)
SODIUM: 138 mmol/L (ref 135–145)
Total Bilirubin: 0.4 mg/dL (ref 0.3–1.2)
Total Protein: 6.7 g/dL (ref 6.0–8.3)

## 2014-07-18 LAB — LIPASE, BLOOD: LIPASE: 36 U/L (ref 11–59)

## 2014-07-18 MED ORDER — SODIUM CHLORIDE 0.9 % IV BOLUS (SEPSIS)
1000.0000 mL | Freq: Once | INTRAVENOUS | Status: AC
  Administered 2014-07-18: 1000 mL via INTRAVENOUS

## 2014-07-18 MED ORDER — ONDANSETRON HCL 4 MG/2ML IJ SOLN
4.0000 mg | Freq: Once | INTRAMUSCULAR | Status: AC
  Administered 2014-07-18: 4 mg via INTRAVENOUS
  Filled 2014-07-18: qty 2

## 2014-07-18 MED ORDER — HYDROMORPHONE HCL 1 MG/ML IJ SOLN
2.0000 mg | Freq: Once | INTRAMUSCULAR | Status: AC
  Administered 2014-07-18: 2 mg via INTRAVENOUS
  Filled 2014-07-18: qty 2

## 2014-07-18 MED ORDER — HYDROMORPHONE HCL 1 MG/ML IJ SOLN
1.0000 mg | Freq: Once | INTRAMUSCULAR | Status: AC
  Administered 2014-07-18: 1 mg via INTRAVENOUS
  Filled 2014-07-18: qty 1

## 2014-07-18 NOTE — ED Provider Notes (Signed)
CSN: [id number]     Arrival date & time 07/17/14  2109 History   First MD Initiated Contact with Patient 07/18/14 0106     Chief Complaint  Patient presents with  . Abdominal Pain     (Consider location/radiation/quality/duration/timing/severity/associated sxs/prior Treatment) HPI  This is a patient with chronic idiopathic pancreatitis who was seen by myself yesterday for exacerbation of the same. He was treated with analgesics and antiemetics and discharged home in improved condition. He returns with persistent pain and nausea that worsened yesterday evening after attempting to eat a piece of chicken. The nausea became so severe he was not able to finish his dinner. He has not been vomiting. He spent most of the day yesterday sleeping. He thinks he may be dehydrated has not been drinking fluids like he should. He has been taking his Nucynta and using his fentanyl patches prescribed. He took Phenergan without relief of his nausea.  Past Medical History  Diagnosis Date  . Pancreatitis   . Kidney stones    Past Surgical History  Procedure Laterality Date  . Appendectomy    . Cholecystectomy    . Kidney stone surgery    . Ercp w/ metal stent placement     History reviewed. No pertinent family history. History  Substance Use Topics  . Smoking status: Current Every Day Smoker -- 0.50 packs/day    Types: Cigarettes  . Smokeless tobacco: Never Used  . Alcohol Use: Yes     Comment: rarely, pt stated that he drinks once ever 3 months and it is usually a beer at dinner    Review of Systems  All other systems reviewed and are negative.   Allergies  Cortisone; Eggs or egg-derived products; and Ketorolac tromethamine  Home Medications   Prior to Admission medications   Medication Sig Start Date End Date Taking? Authorizing Provider  Cyanocobalamin (B-12 PO) Take 1 tablet by mouth daily.    Historical Provider, MD  Doxylamine-Phenylephrine-APAP (VICKS NYQUIL SINUS) 6.25-5-325 MG CAPS  Take 1 capsule by mouth daily as needed (for sinus).    Historical Provider, MD  famotidine (PEPCID) 20 MG tablet Take 1 tablet (20 mg total) by mouth 2 (two) times daily. 03/23/14   Fredia Sorrow, MD  fentaNYL (DURAGESIC - DOSED MCG/HR) 25 MCG/HR Place 50 mcg onto the skin every other day.     Historical Provider, MD  HYDROcodone-acetaminophen (NORCO/VICODIN) 5-325 MG per tablet Take 1-2 tablets by mouth every 6 (six) hours as needed for moderate pain. 03/23/14   Fredia Sorrow, MD  Melatonin 5 MG TABS Take 5 mg by mouth at bedtime as needed (for sleep).    Historical Provider, MD  promethazine (PHENERGAN) 25 MG tablet Take 1 tablet (25 mg total) by mouth every 6 (six) hours as needed for nausea or vomiting. 07/17/14   Karen Chafe Betzaida Cremeens, MD  sucralfate (CARAFATE) 1 GM/10ML suspension Take 10 mLs (1 g total) by mouth 4 (four) times daily -  with meals and at bedtime. 05/04/14   Quintella Reichert, MD  tapentadol (NUCYNTA) 50 MG TABS Take 100 mg by mouth every 6 (six) hours as needed for moderate pain.     Historical Provider, MD   BP 116/83 mmHg  Pulse 113  Temp(Src) 98.4 F (36.9 C) (Oral)  Resp 20  Ht 6\' 1"  (1.854 m)  Wt 210 lb (95.255 kg)  BMI 27.71 kg/m2  SpO2 100%   Physical Exam  General: Well-developed, well-nourished male in no acute distress; appearance consistent with age  of record HENT: normocephalic; atraumatic Eyes: pupils equal, round and reactive to light; extraocular muscles intact Neck: supple Heart: regular rate and rhythm Lungs: clear to auscultation bilaterally Abdomen: soft; nondistended; epigastric tenderness; no masses or hepatosplenomegaly; bowel sounds present Extremities: No deformity; full range of motion; pulses normal Neurologic: Awake, alert and oriented; motor function intact in all extremities and symmetric; no facial droop Skin: Warm and dry Psychiatric: Flat affect    ED Course  Procedures (including critical care time)   MDM  Nursing notes and vitals  signs, including pulse oximetry, reviewed.  Summary of this visit's results, reviewed by myself:  Labs:  Results for orders placed or performed during the hospital encounter of 07/17/14 (from the past 24 hour(s))  Lipase, blood     Status: None   Collection Time: 07/18/14  1:31 AM  Result Value Ref Range   Lipase 36 11 - 59 U/L  Comprehensive metabolic panel     Status: Abnormal   Collection Time: 07/18/14  1:31 AM  Result Value Ref Range   Sodium 138 135 - 145 mmol/L   Potassium 3.6 3.5 - 5.1 mmol/L   Chloride 108 96 - 112 mmol/L   CO2 28 19 - 32 mmol/L   Glucose, Bld 94 70 - 99 mg/dL   BUN 10 6 - 23 mg/dL   Creatinine, Ser 0.85 0.50 - 1.35 mg/dL   Calcium 8.8 8.4 - 10.5 mg/dL   Total Protein 6.7 6.0 - 8.3 g/dL   Albumin 4.0 3.5 - 5.2 g/dL   AST 20 0 - 37 U/L   ALT 15 0 - 53 U/L   Alkaline Phosphatase 58 39 - 117 U/L   Total Bilirubin 0.4 0.3 - 1.2 mg/dL   GFR calc non Af Amer >90 >90 mL/min   GFR calc Af Amer >90 >90 mL/min   Anion gap 2 (L) 5 - 15  CBC with Differential/Platelet     Status: None   Collection Time: 07/18/14  1:31 AM  Result Value Ref Range   WBC 10.2 4.0 - 10.5 K/uL   RBC 4.90 4.22 - 5.81 MIL/uL   Hemoglobin 14.8 13.0 - 17.0 g/dL   HCT 42.7 39.0 - 52.0 %   MCV 87.1 78.0 - 100.0 fL   MCH 30.2 26.0 - 34.0 pg   MCHC 34.7 30.0 - 36.0 g/dL   RDW 12.2 11.5 - 15.5 %   Platelets 182 150 - 400 K/uL   Neutrophils Relative % 52 43 - 77 %   Neutro Abs 5.3 1.7 - 7.7 K/uL   Lymphocytes Relative 39 12 - 46 %   Lymphs Abs 3.9 0.7 - 4.0 K/uL   Monocytes Relative 6 3 - 12 %   Monocytes Absolute 0.7 0.1 - 1.0 K/uL   Eosinophils Relative 3 0 - 5 %   Eosinophils Absolute 0.3 0.0 - 0.7 K/uL   Basophils Relative 0 0 - 1 %   Basophils Absolute 0.0 0.0 - 0.1 K/uL   3:32 AM Patient feeling better. Pain is improved and nausea has resolved. Lipase no longer elevated.    Wynetta Fines, MD 07/18/14 [phone]

## 2014-07-18 NOTE — Discharge Instructions (Signed)
Low-Fat Diet for Pancreatitis or Gallbladder Conditions °A low-fat diet can be helpful if you have pancreatitis or a gallbladder condition. With these conditions, your pancreas and gallbladder have trouble digesting fats. A healthy eating plan with less fat will help rest your pancreas and gallbladder and reduce your symptoms. °WHAT DO I NEED TO KNOW ABOUT THIS DIET? °· Eat a low-fat diet. °¨ Reduce your fat intake to less than 20-30% of your total daily calories. This is less than 50-60 g of fat per day. °¨ Remember that you need some fat in your diet. Ask your dietician what your daily goal should be. °¨ Choose nonfat and low-fat healthy foods. Look for the words "nonfat," "low fat," or "fat free." °¨ As a guide, look on the label and choose foods with less than 3 g of fat per serving. Eat only one serving. °· Avoid alcohol. °· Do not smoke. If you need help quitting, talk with your health care provider. °· Eat small frequent meals instead of three large heavy meals. °WHAT FOODS CAN I EAT? °Grains °Include healthy grains and starches such as potatoes, wheat bread, fiber-rich cereal, and brown rice. Choose whole grain options whenever possible. In adults, whole grains should account for 45-65% of your daily calories.  °Fruits and Vegetables °Eat plenty of fruits and vegetables. Fresh fruits and vegetables add fiber to your diet. °Meats and Other Protein Sources °Eat lean meat such as chicken and pork. Trim any fat off of meat before cooking it. Eggs, fish, and beans are other sources of protein. In adults, these foods should account for 10-35% of your daily calories. °Dairy °Choose low-fat milk and dairy options. Dairy includes fat and protein, as well as calcium.  °Fats and Oils °Limit high-fat foods such as fried foods, sweets, baked goods, sugary drinks.  °Other °Creamy sauces and condiments, such as mayonnaise, can add extra fat. Think about whether or not you need to use them, or use smaller amounts or low fat  options. °WHAT FOODS ARE NOT RECOMMENDED? °· High fat foods, such as: °¨ Baked goods. °¨ Ice cream. °¨ French toast. °¨ Sweet rolls. °¨ Pizza. °¨ Cheese bread. °¨ Foods covered with batter, butter, creamy sauces, or cheese. °¨ Fried foods. °¨ Sugary drinks and desserts. °· Foods that cause gas or bloating °Document Released: 05/19/2013 Document Reviewed: 05/19/2013 °[employer]® Patient Information ©2015 [employer]. This information is not intended to replace advice given to you by your health care provider. Make sure you discuss any questions you have with your health care provider. ° °

## 2014-08-14 DIAGNOSIS — Z9889 Other specified postprocedural states: Secondary | ICD-10-CM | POA: Diagnosis not present

## 2014-08-14 DIAGNOSIS — R1084 Generalized abdominal pain: Secondary | ICD-10-CM | POA: Diagnosis not present

## 2014-08-14 DIAGNOSIS — Z8719 Personal history of other diseases of the digestive system: Secondary | ICD-10-CM | POA: Diagnosis not present

## 2014-08-14 DIAGNOSIS — Z720 Tobacco use: Secondary | ICD-10-CM | POA: Diagnosis not present

## 2014-08-14 DIAGNOSIS — Z9049 Acquired absence of other specified parts of digestive tract: Secondary | ICD-10-CM | POA: Diagnosis not present

## 2014-08-14 DIAGNOSIS — Z87442 Personal history of urinary calculi: Secondary | ICD-10-CM | POA: Diagnosis not present

## 2014-08-14 DIAGNOSIS — R112 Nausea with vomiting, unspecified: Secondary | ICD-10-CM | POA: Diagnosis not present

## 2014-08-15 ENCOUNTER — Encounter (HOSPITAL_BASED_OUTPATIENT_CLINIC_OR_DEPARTMENT_OTHER): Payer: Self-pay | Admitting: Emergency Medicine

## 2014-08-15 ENCOUNTER — Emergency Department (HOSPITAL_BASED_OUTPATIENT_CLINIC_OR_DEPARTMENT_OTHER)
Admission: EM | Admit: 2014-08-15 | Discharge: 2014-08-15 | Disposition: A | Discharge status: Home / Self Care | Payer: BC Managed Care – PPO | Attending: Emergency Medicine | Admitting: Emergency Medicine

## 2014-08-15 DIAGNOSIS — R109 Unspecified abdominal pain: Secondary | ICD-10-CM

## 2014-08-15 LAB — COMPREHENSIVE METABOLIC PANEL
ALT: 26 U/L (ref 0–53)
AST: 28 U/L (ref 0–37)
Albumin: 3.8 g/dL (ref 3.5–5.2)
Alkaline Phosphatase: 64 U/L (ref 39–117)
Anion gap: 6 (ref 5–15)
BUN: 15 mg/dL (ref 6–23)
CO2: 26 mmol/L (ref 19–32)
CREATININE: 0.74 mg/dL (ref 0.50–1.35)
Calcium: 9 mg/dL (ref 8.4–10.5)
Chloride: 107 mmol/L (ref 96–112)
GFR calc Af Amer: >90 mL/min (ref >90)
Glucose, Bld: 95 mg/dL (ref 70–99)
Potassium: 4 mmol/L (ref 3.5–5.1)
Sodium: 139 mmol/L (ref 135–145)
TOTAL PROTEIN: 6.7 g/dL (ref 6.0–8.3)
Total Bilirubin: 0.2 mg/dL — ABNORMAL LOW (ref 0.3–1.2)

## 2014-08-15 LAB — CBC WITH DIFFERENTIAL/PLATELET
BASOS ABS: 0.1 10*3/uL (ref 0.0–0.1)
Basophils Relative: 1 % (ref 0–1)
Eosinophils Absolute: 0.5 10*3/uL (ref 0.0–0.7)
Eosinophils Relative: 4 % (ref 0–5)
HEMATOCRIT: 39.4 % (ref 39.0–52.0)
Hemoglobin: 14 g/dL (ref 13.0–17.0)
LYMPHS PCT: 35 % (ref 12–46)
Lymphs Abs: 3.6 10*3/uL (ref 0.7–4.0)
MCH: 30.8 pg (ref 26.0–34.0)
MCHC: 35.5 g/dL (ref 30.0–36.0)
MCV: 86.6 fL (ref 78.0–100.0)
Monocytes Absolute: 1 10*3/uL (ref 0.1–1.0)
Monocytes Relative: 10 % (ref 3–12)
Neutro Abs: 5.2 10*3/uL (ref 1.7–7.7)
Neutrophils Relative %: 50 % (ref 43–77)
PLATELETS: 183 10*3/uL (ref 150–400)
RBC: 4.55 MIL/uL (ref 4.22–5.81)
RDW: 12.4 % (ref 11.5–15.5)
WBC: 10.4 10*3/uL (ref 4.0–10.5)

## 2014-08-15 LAB — LIPASE, BLOOD: Lipase: 40 U/L (ref 11–59)

## 2014-08-15 MED ORDER — HYDROMORPHONE HCL 1 MG/ML IJ SOLN
2.0000 mg | Freq: Once | INTRAMUSCULAR | Status: AC
Start: 2014-08-15 — End: 2014-08-15
  Administered 2014-08-15: 2 mg via INTRAVENOUS
  Filled 2014-08-15: qty 2

## 2014-08-15 MED ORDER — SODIUM CHLORIDE 0.9 % IV BOLUS (SEPSIS)
1000.0000 mL | Freq: Once | INTRAVENOUS | Status: AC
  Administered 2014-08-15: 1000 mL via INTRAVENOUS

## 2014-08-15 MED ORDER — GI COCKTAIL ~~LOC~~
30.0000 mL | Freq: Once | ORAL | Status: AC
  Administered 2014-08-15: 30 mL via ORAL
  Filled 2014-08-15: qty 30

## 2014-08-15 MED ORDER — HYDROMORPHONE HCL 1 MG/ML IJ SOLN
1.0000 mg | Freq: Once | INTRAMUSCULAR | Status: AC
  Administered 2014-08-15: 1 mg via INTRAVENOUS
  Filled 2014-08-15: qty 1

## 2014-08-15 MED ORDER — ONDANSETRON HCL 4 MG/2ML IJ SOLN
4.0000 mg | Freq: Once | INTRAMUSCULAR | Status: AC
  Administered 2014-08-15: 4 mg via INTRAVENOUS
  Filled 2014-08-15: qty 2

## 2014-08-15 MED ORDER — FENTANYL CITRATE 0.05 MG/ML IJ SOLN
100.0000 ug | Freq: Once | INTRAMUSCULAR | Status: AC
  Administered 2014-08-15: 100 ug via INTRAVENOUS
  Filled 2014-08-15: qty 2

## 2014-08-15 MED ORDER — DICYCLOMINE HCL 10 MG/ML IM SOLN
20.0000 mg | Freq: Once | INTRAMUSCULAR | Status: AC
  Administered 2014-08-15: 20 mg via INTRAMUSCULAR
  Filled 2014-08-15: qty 2

## 2014-08-15 NOTE — ED Provider Notes (Signed)
CSN: [id number]     Arrival date & time 08/14/14  2358 History  This chart was scribed for Roshell Brigham, MD by Edison Simon, ED Scribe. This patient was seen in room MH05/MH05 and the patient's care was started at 12:54 AM.    Chief Complaint  Patient presents with  . Abdominal Pain   Patient is a 39 y.o. male presenting with abdominal pain. The history is provided by the patient. No language interpreter was used.  Abdominal Pain Pain location:  Generalized Pain quality: sharp   Pain radiates to:  Does not radiate Pain severity:  Severe Onset quality:  Sudden Duration: few hours ago. Timing:  Constant Progression:  Unchanged Chronicity:  Chronic Context: retching   Relieved by:  None tried Worsened by:  Nothing tried Ineffective treatments:  None tried Associated symptoms: nausea and vomiting   Associated symptoms: no diarrhea, no fever and no shortness of breath   Risk factors: no recent hospitalization     HPI Comments: [name] is a 39 y.o. male who presents to the Emergency Department complaining of abdominal pain with onset a few hours ago. He states he ate a ham and cheese pretzel, then 30 minutes after had vomiting and pain. He suspects it is due to flare of pancreatitis. He states he has chronic pancreatitis for which he sees pain management and uses 50mg  Fentanyl patch replaced every 3 days as well as Nucynta. He denies diarhrea   Past Medical History  Diagnosis Date  . Pancreatitis   . Kidney stones    Past Surgical History  Procedure Laterality Date  . Appendectomy    . Cholecystectomy    . Kidney stone surgery    . Ercp w/ metal stent placement     No family history on file. History  Substance Use Topics  . Smoking status: Current Every Day Smoker -- 0.50 packs/day    Types: Cigarettes  . Smokeless tobacco: Never Used  . Alcohol Use: Yes     Comment: rarely, pt stated that he drinks once ever 3 months and it is usually a beer at dinner    Review of  Systems  Constitutional: Negative for fever.  Respiratory: Negative for shortness of breath.   Gastrointestinal: Positive for nausea, vomiting and abdominal pain. Negative for diarrhea.  All other systems reviewed and are negative.     Allergies  Cortisone; Eggs or egg-derived products; and Ketorolac tromethamine  Home Medications   Prior to Admission medications   Medication Sig Start Date End Date Taking? Authorizing Provider  Cyanocobalamin (B-12 PO) Take 1 tablet by mouth daily.    Historical Provider, MD  Doxylamine-Phenylephrine-APAP (VICKS NYQUIL SINUS) 6.25-5-325 MG CAPS Take 1 capsule by mouth daily as needed (for sinus).    Historical Provider, MD  famotidine (PEPCID) 20 MG tablet Take 1 tablet (20 mg total) by mouth 2 (two) times daily. 03/23/14   Fredia Sorrow, MD  fentaNYL (DURAGESIC - DOSED MCG/HR) 25 MCG/HR Place 50 mcg onto the skin every other day.     Historical Provider, MD  HYDROcodone-acetaminophen (NORCO/VICODIN) 5-325 MG per tablet Take 1-2 tablets by mouth every 6 (six) hours as needed for moderate pain. 03/23/14   Fredia Sorrow, MD  Melatonin 5 MG TABS Take 5 mg by mouth at bedtime as needed (for sleep).    Historical Provider, MD  promethazine (PHENERGAN) 25 MG tablet Take 1 tablet (25 mg total) by mouth every 6 (six) hours as needed for nausea or vomiting. 07/17/14  John Molpus, MD  sucralfate (CARAFATE) 1 GM/10ML suspension Take 10 mLs (1 g total) by mouth 4 (four) times daily -  with meals and at bedtime. 05/04/14   Quintella Reichert, MD  tapentadol (NUCYNTA) 50 MG TABS Take 100 mg by mouth every 6 (six) hours as needed for moderate pain.     Historical Provider, MD   BP 160/93 mmHg  Pulse 95  Temp(Src) 98.2 F (36.8 C) (Oral)  Resp 20  SpO2 99% Physical Exam  Constitutional: He is oriented to person, place, and time. He appears well-developed and well-nourished.  HENT:  Head: Normocephalic and atraumatic.  Mouth/Throat: Oropharynx is clear and moist.   Moist mucous membranes  Eyes: Conjunctivae and EOM are normal. Pupils are equal, round, and reactive to light.  Neck: Normal range of motion. Neck supple.  Cardiovascular: Normal rate, regular rhythm and normal heart sounds.   No murmur heard. Pulmonary/Chest: Effort normal and breath sounds normal. No respiratory distress. He has no wheezes. He has no rales.  Abdominal: Soft. He exhibits no mass. There is no tenderness. There is no rebound and no guarding.  Hyperactive bowel sounds  Musculoskeletal: Normal range of motion.  Neurological: He is alert and oriented to person, place, and time.  Skin: Skin is warm and dry.  Psychiatric: He has a normal mood and affect.  Nursing note and vitals reviewed.   ED Course  Procedures (including critical care time)  DIAGNOSTIC STUDIES: Oxygen Saturation is 99% on room air, normal by my interpretation.    COORDINATION OF CARE: 12:57 AM Discussed treatment plan with patient at beside, the patient agrees with the plan and has no further questions at this time.   Labs Review Labs Reviewed - No data to display  Imaging Review No results found.   EKG Interpretation None      MDM   Final diagnoses:  None    Medications  sodium chloride 0.9 % bolus 1,000 mL (0 mLs Intravenous Stopped 08/15/14 0259)  HYDROmorphone (DILAUDID) injection 2 mg (2 mg Intravenous Given 08/15/14 0155)  ondansetron (ZOFRAN) injection 4 mg (4 mg Intravenous Given 08/15/14 0155)  dicyclomine (BENTYL) injection 20 mg (20 mg Intramuscular Given 08/15/14 0157)  fentaNYL (SUBLIMAZE) injection 100 mcg (100 mcg Intravenous Given 08/15/14 0248)  gi cocktail (Maalox,Lidocaine,Donnatal) (30 mLs Oral Given 08/15/14 0248)   Will need to follow up with your pain management for ongoing care.    I personally performed the services described in this documentation, which was scribed in my presence. The recorded information has been reviewed and is accurate.    Veatrice Kells,  MD 08/15/14 [phone]

## 2014-08-15 NOTE — ED Notes (Signed)
Pt reports sudden onset of upper abd pain denies event or injury admits to N/V x5 and Hx chronic pancreatitis

## 2014-08-15 NOTE — ED Notes (Signed)
Pt alert, NAD, calm, interactive, resps e/u, speaking in clear complete sentences, c/o abd pain, relates to pancreatitis, admits to nv (denies: fever, diarrhea, bleeding, constipation or other sx), describes as a flare up, onset yesterday.

## 2014-08-15 NOTE — ED Notes (Addendum)
EDP out of room. Pt seen by EDP prior to RN assessment, see MD notes, orders received and initiated.

## 2014-08-17 ENCOUNTER — Encounter (HOSPITAL_BASED_OUTPATIENT_CLINIC_OR_DEPARTMENT_OTHER): Payer: Self-pay

## 2014-08-17 ENCOUNTER — Emergency Department (HOSPITAL_BASED_OUTPATIENT_CLINIC_OR_DEPARTMENT_OTHER)
Admission: EM | Admit: 2014-08-17 | Discharge: 2014-08-18 | Disposition: A | Discharge status: Home / Self Care | Payer: BLUE CROSS/BLUE SHIELD | Attending: Emergency Medicine | Admitting: Emergency Medicine

## 2014-08-17 DIAGNOSIS — Z720 Tobacco use: Secondary | ICD-10-CM | POA: Diagnosis not present

## 2014-08-17 DIAGNOSIS — Z79899 Other long term (current) drug therapy: Secondary | ICD-10-CM | POA: Diagnosis not present

## 2014-08-17 DIAGNOSIS — Z8719 Personal history of other diseases of the digestive system: Secondary | ICD-10-CM | POA: Insufficient documentation

## 2014-08-17 DIAGNOSIS — G8929 Other chronic pain: Secondary | ICD-10-CM | POA: Insufficient documentation

## 2014-08-17 DIAGNOSIS — R1013 Epigastric pain: Secondary | ICD-10-CM | POA: Diagnosis not present

## 2014-08-17 DIAGNOSIS — R1110 Vomiting, unspecified: Secondary | ICD-10-CM | POA: Diagnosis not present

## 2014-08-17 DIAGNOSIS — R220 Localized swelling, mass and lump, head: Secondary | ICD-10-CM | POA: Diagnosis not present

## 2014-08-17 DIAGNOSIS — Z87442 Personal history of urinary calculi: Secondary | ICD-10-CM | POA: Diagnosis not present

## 2014-08-17 DIAGNOSIS — Z9089 Acquired absence of other organs: Secondary | ICD-10-CM | POA: Diagnosis not present

## 2014-08-17 DIAGNOSIS — R109 Unspecified abdominal pain: Secondary | ICD-10-CM | POA: Diagnosis present

## 2014-08-17 LAB — CBC WITH DIFFERENTIAL/PLATELET
Basophils Absolute: 0.1 10*3/uL (ref 0.0–0.1)
Basophils Relative: 1 % (ref 0–1)
EOS ABS: 0.2 10*3/uL (ref 0.0–0.7)
Eosinophils Relative: 2 % (ref 0–5)
HEMATOCRIT: 45.8 % (ref 39.0–52.0)
Hemoglobin: 16.3 g/dL (ref 13.0–17.0)
LYMPHS ABS: 2.6 10*3/uL (ref 0.7–4.0)
Lymphocytes Relative: 21 % (ref 12–46)
MCH: 30.5 pg (ref 26.0–34.0)
MCHC: 35.6 g/dL (ref 30.0–36.0)
MCV: 85.6 fL (ref 78.0–100.0)
MONO ABS: 1.3 10*3/uL — AB (ref 0.1–1.0)
MONOS PCT: 11 % (ref 3–12)
Neutro Abs: 8 10*3/uL — ABNORMAL HIGH (ref 1.7–7.7)
Neutrophils Relative %: 65 % (ref 43–77)
Platelets: 243 10*3/uL (ref 150–400)
RBC: 5.35 MIL/uL (ref 4.22–5.81)
RDW: 12.5 % (ref 11.5–15.5)
WBC: 12.3 10*3/uL — ABNORMAL HIGH (ref 4.0–10.5)

## 2014-08-17 LAB — COMPREHENSIVE METABOLIC PANEL
ALK PHOS: 62 U/L (ref 39–117)
ALT: 45 U/L (ref 0–53)
ANION GAP: 8 (ref 5–15)
AST: 47 U/L — ABNORMAL HIGH (ref 0–37)
Albumin: 4.3 g/dL (ref 3.5–5.2)
BUN: 11 mg/dL (ref 6–23)
CO2: 26 mmol/L (ref 19–32)
Calcium: 9.5 mg/dL (ref 8.4–10.5)
Chloride: 107 mmol/L (ref 96–112)
Creatinine, Ser: 0.86 mg/dL (ref 0.50–1.35)
Glucose, Bld: 93 mg/dL (ref 70–99)
Potassium: 4 mmol/L (ref 3.5–5.1)
Sodium: 141 mmol/L (ref 135–145)
TOTAL PROTEIN: 7.8 g/dL (ref 6.0–8.3)
Total Bilirubin: 0.5 mg/dL (ref 0.3–1.2)

## 2014-08-17 LAB — URINALYSIS, ROUTINE W REFLEX MICROSCOPIC
Bilirubin Urine: NEGATIVE
Glucose, UA: NEGATIVE mg/dL
Hgb urine dipstick: NEGATIVE
Ketones, ur: NEGATIVE mg/dL
LEUKOCYTES UA: NEGATIVE
Nitrite: NEGATIVE
PROTEIN: NEGATIVE mg/dL
Specific Gravity, Urine: 1.008 (ref 1.005–1.030)
UROBILINOGEN UA: 0.2 mg/dL (ref 0.0–1.0)
pH: 6 (ref 5.0–8.0)

## 2014-08-17 LAB — LIPASE, BLOOD: LIPASE: 29 U/L (ref 11–59)

## 2014-08-17 MED ORDER — HYDROMORPHONE HCL 1 MG/ML IJ SOLN
1.0000 mg | Freq: Once | INTRAMUSCULAR | Status: AC
  Administered 2014-08-17: 1 mg via INTRAVENOUS
  Filled 2014-08-17: qty 1

## 2014-08-17 MED ORDER — SODIUM CHLORIDE 0.9 % IV BOLUS (SEPSIS)
1000.0000 mL | Freq: Once | INTRAVENOUS | Status: AC
  Administered 2014-08-17: 1000 mL via INTRAVENOUS

## 2014-08-17 MED ORDER — SODIUM CHLORIDE 0.9 % IV BOLUS (SEPSIS)
2000.0000 mL | Freq: Once | INTRAVENOUS | Status: AC
  Administered 2014-08-17: 2000 mL via INTRAVENOUS

## 2014-08-17 MED ORDER — ONDANSETRON HCL 4 MG/2ML IJ SOLN
4.0000 mg | Freq: Once | INTRAMUSCULAR | Status: AC
  Administered 2014-08-17: 4 mg via INTRAVENOUS
  Filled 2014-08-17: qty 2

## 2014-08-17 NOTE — ED Notes (Signed)
Pt states had labs drawn yesterday.

## 2014-08-17 NOTE — ED Provider Notes (Addendum)
CSN: [id number]     Arrival date & time 08/17/14  2045 History  This chart was scribed for Debby Freiberg, MD by Starleen Arms, ED Scribe. This patient was seen in room MH12/MH12 and the patient's care was started at 9:31 PM.   Chief Complaint  Patient presents with  . Abdominal Pain   HPI HPI Comments: [name] is a 39 y.o. male with a history of chronic pancreatitis who presents to the Emergency Department complaining of acute on chronic abdominal pain onset two days ago with 6-7 episodes of associated vomiting.  Patient reports he was seen in the ED two days ago with the same complaints and was given pain medication, anti-emetics, and a gi cocktail and discharged home.  Patient is prescribed a 50 mg Fentanyl patch changed every three days and Nucynta, of which he is compliant with both.  he has taken 5 Nucynta today.  Patient is scheduled to be seen with his pain management clinic in two days and suspects he will try to schedule an apointment with his gastroenterologist shortly after that.  Patient denies diarrhea.  Pain worse with eating, palpation.  Not relieved by anything  Past Medical History  Diagnosis Date  . Pancreatitis   . Kidney stones    Past Surgical History  Procedure Laterality Date  . Appendectomy    . Cholecystectomy    . Kidney stone surgery    . Ercp w/ metal stent placement     No family history on file. History  Substance Use Topics  . Smoking status: Current Every Day Smoker -- 0.50 packs/day    Types: Cigarettes  . Smokeless tobacco: Never Used  . Alcohol Use: Yes     Comment: rarely, pt stated that he drinks once ever 3 months and it is usually a beer at dinner    Review of Systems  Gastrointestinal: Positive for vomiting and abdominal pain. Negative for nausea and diarrhea.  All other systems reviewed and are negative.     Allergies  Cortisone; Eggs or egg-derived products; Ketorolac tromethamine; and Haldol  Home Medications   Prior to  Admission medications   Medication Sig Start Date End Date Taking? Authorizing Provider  Cyanocobalamin (B-12 PO) Take 1 tablet by mouth daily.    Historical Provider, MD  diphenhydrAMINE (BENADRYL) 25 MG tablet Take 1 tablet (25 mg total) by mouth every 6 (six) hours. For the next 3 days 08/18/14   Linton Flemings, MD  Doxylamine-Phenylephrine-APAP [facility] NYQUIL SINUS) 6.25-5-325 MG CAPS Take 1 capsule by mouth daily as needed (for sinus).    Historical Provider, MD  famotidine (PEPCID) 20 MG tablet Take 1 tablet (20 mg total) by mouth 2 (two) times daily. Patient not taking: Reported on 08/18/2014 03/23/14   Fredia Sorrow, MD  fentaNYL (DURAGESIC - DOSED MCG/HR) 25 MCG/HR Place 50 mcg onto the skin every other day.     Historical Provider, MD  fentaNYL (DURAGESIC - DOSED MCG/HR) 50 MCG/HR Place 50 mcg onto the skin every 3 (three) days.    Historical Provider, MD  HYDROcodone-acetaminophen (NORCO/VICODIN) 5-325 MG per tablet Take 1-2 tablets by mouth every 6 (six) hours as needed for moderate pain. Patient not taking: Reported on 08/18/2014 03/23/14   Fredia Sorrow, MD  Melatonin 5 MG TABS Take 10 mg by mouth at bedtime as needed (for sleep).     Historical Provider, MD  methocarbamol (ROBAXIN-750) 750 MG tablet Take 1 tablet (750 mg total) by mouth every 6 (six) hours as needed for  muscle spasms. 08/18/14   Linton Flemings, MD  promethazine (PHENERGAN) 25 MG tablet Take 1 tablet (25 mg total) by mouth every 6 (six) hours as needed for nausea or vomiting. Patient not taking: Reported on 08/18/2014 07/17/14   Shanon Rosser, MD  sucralfate (CARAFATE) 1 GM/10ML suspension Take 10 mLs (1 g total) by mouth 4 (four) times daily -  with meals and at bedtime. Patient taking differently: Take 1 g by mouth 3 (three) times daily as needed (cronic pantiritious).  05/04/14   Quintella Reichert, MD  tapentadol (NUCYNTA) 50 MG TABS Take 100 mg by mouth every 6 (six) hours as needed for moderate pain.     Historical Provider, MD   Tapentadol HCl 75 MG TABS Take 75 mg by mouth every 4 (four) hours as needed (pain).    Historical Provider, MD   BP 131/89 mmHg  Pulse 84  Temp(Src) 98.7 F (37.1 C)  Resp 18  Ht 6\' 1"  (1.854 m)  Wt 210 lb (95.255 kg)  BMI 27.71 kg/m2  SpO2 94% Physical Exam  Constitutional: He is oriented to person, place, and time. He appears well-developed and well-nourished.  HENT:  Head: Normocephalic and atraumatic.  Eyes: Conjunctivae and EOM are normal.  Neck: Normal range of motion. Neck supple.  Cardiovascular: Normal rate, regular rhythm and normal heart sounds.   Pulmonary/Chest: Effort normal and breath sounds normal. No respiratory distress.  Abdominal: He exhibits no distension. There is tenderness in the epigastric area. There is no rebound and no guarding.  Musculoskeletal: Normal range of motion.  Neurological: He is alert and oriented to person, place, and time.  Skin: Skin is warm and dry.  Vitals reviewed.   ED Course  Procedures (including critical care time)  DIAGNOSTIC STUDIES: Oxygen Saturation is 98% on RA, normal by my interpretation.    COORDINATION OF CARE:  3:14 PM Discussed treatment plan with patient at bedside.  Patient acknowledges and agrees with plan.    Labs Review Labs Reviewed  COMPREHENSIVE METABOLIC PANEL - Abnormal; Notable for the following:    AST 47 (*)    All other components within normal limits  CBC WITH DIFFERENTIAL/PLATELET - Abnormal; Notable for the following:    WBC 12.3 (*)    Neutro Abs 8.0 (*)    Monocytes Absolute 1.3 (*)    All other components within normal limits  LIPASE, BLOOD  URINALYSIS, ROUTINE W REFLEX MICROSCOPIC    Imaging Review No results found.   EKG Interpretation None      MDM   Final diagnoses:  Epigastric pain    39 y.o. male with pertinent PMH of chronic pancreatitis presents with acute on chronic symptoms.  Pt has numerous visits for identical symptoms.  He is on chronic fentanyl patches, is  on nucyenta, and has seen pain clinics in the past.  He states that symptoms are identical to prior flares.  On arrival vitals and physical exam as above.  He is well appearing, nontoxic, and has been taking PO.  Wu unremarkable.  Pain mildly relieved by dilaudid 3 mg.  Will have him fu with PCP.  Given haldol in an attempt to alleviate symptoms with plan to monitor for dystonia prior to dc.  I have reviewed all laboratory and imaging studies if ordered as above  1. Epigastric pain           Debby Freiberg, MD 08/20/14 [phone]

## 2014-08-17 NOTE — ED Notes (Signed)
Pt c/o pancreatitis attack since Saturday, seen here since, states is on a clear liquid diet, taking meds from pain clinic with no relief; states has an appt with PCP on Thursday; c/o LUQ pain radiating through to back with vomiting x8 today

## 2014-08-18 ENCOUNTER — Emergency Department (HOSPITAL_COMMUNITY)
Admission: EM | Admit: 2014-08-18 | Discharge: 2014-08-18 | Disposition: A | Discharge status: Home / Self Care | Payer: BLUE CROSS/BLUE SHIELD | Attending: Emergency Medicine | Admitting: Emergency Medicine

## 2014-08-18 ENCOUNTER — Encounter (HOSPITAL_COMMUNITY): Payer: Self-pay | Admitting: Emergency Medicine

## 2014-08-18 DIAGNOSIS — G2402 Drug induced acute dystonia: Secondary | ICD-10-CM

## 2014-08-18 DIAGNOSIS — R000 Tachycardia, unspecified: Secondary | ICD-10-CM | POA: Insufficient documentation

## 2014-08-18 DIAGNOSIS — R220 Localized swelling, mass and lump, head: Secondary | ICD-10-CM | POA: Diagnosis present

## 2014-08-18 DIAGNOSIS — Z87442 Personal history of urinary calculi: Secondary | ICD-10-CM | POA: Diagnosis not present

## 2014-08-18 DIAGNOSIS — Z8719 Personal history of other diseases of the digestive system: Secondary | ICD-10-CM | POA: Diagnosis not present

## 2014-08-18 DIAGNOSIS — T434X5A Adverse effect of butyrophenone and thiothixene neuroleptics, initial encounter: Secondary | ICD-10-CM | POA: Insufficient documentation

## 2014-08-18 DIAGNOSIS — Z720 Tobacco use: Secondary | ICD-10-CM | POA: Insufficient documentation

## 2014-08-18 MED ORDER — HYDROMORPHONE HCL 1 MG/ML IJ SOLN
1.0000 mg | Freq: Once | INTRAMUSCULAR | Status: AC
  Administered 2014-08-18: 1 mg via INTRAVENOUS
  Filled 2014-08-18: qty 1

## 2014-08-18 MED ORDER — METHOCARBAMOL 750 MG PO TABS: 750.0000 mg | ORAL_TABLET | Freq: Four times a day (QID) | ORAL | Status: DC | PRN

## 2014-08-18 MED ORDER — DIPHENHYDRAMINE HCL 25 MG PO TABS: 25.0000 mg | ORAL_TABLET | Freq: Four times a day (QID) | ORAL | Status: DC

## 2014-08-18 MED ORDER — METHOCARBAMOL 500 MG PO TABS
500.0000 mg | ORAL_TABLET | Freq: Once | ORAL | Status: AC
  Administered 2014-08-18: 500 mg via ORAL
  Filled 2014-08-18: qty 1

## 2014-08-18 MED ORDER — LORAZEPAM 2 MG/ML IJ SOLN
1.0000 mg | Freq: Once | INTRAMUSCULAR | Status: AC
  Administered 2014-08-18: 1 mg via INTRAVENOUS
  Filled 2014-08-18: qty 1

## 2014-08-18 MED ORDER — HALOPERIDOL LACTATE 5 MG/ML IJ SOLN
2.0000 mg | Freq: Once | INTRAMUSCULAR | Status: AC
  Administered 2014-08-18: 2 mg via INTRAVENOUS
  Filled 2014-08-18: qty 1

## 2014-08-18 NOTE — ED Notes (Signed)
Pt arrives from home via [place], reports seen tonight at [facility] for chronic pancreatitis, reports was given 3 doses of dilaudid, 1 of zofran, and 1 dose of haldol (first time getting haldol).  About 30 minutes after Haldol reports tightening in jaw, unable to open it, became diaphoretic, "couldn't sit still".  Reports taking 10mg  liquid benadryl at home , no relief.  Per EMS, pt trembing on arrival, tachy, RR elevated.   EMS reports giving 25 mg Benadryl enroute.   AOx4. rr e/u, slight tremble noted.  Pt reports 8/10 jaw pain, 7/10 pancreatitis pain.

## 2014-08-18 NOTE — Discharge Instructions (Signed)
Dystonic Reaction A dystonic reaction is generally a side effect to a particular medication. Often the medications are used to treat psychological or psychiatric conditions. They often come from other common medications such as antihistamines, cimetidine, doxepin, and bromocriptine. These reactions occur when the normal patterns of our nerve receptors are upset by a particular medication and the imbalance causes multiple types of muscle spasm. This not a drug allergy. It is your own particular response to the particular medication you have taken. DIAGNOSIS  This diagnosis (learning what is wrong) is made by the obvious symptoms (problems) of contraction of multiple muscles in the body and the usual rapid response to treatment. Because of multiple muscle groups contracting, it is associated with abnormal movements of the face, tongue, neck, abdomen (belly), back and with bizarre grimacing. This illness is rarely life threatening and generally responds within minutes to Benadryl, Cogentin, or Valium. Although sometimes frightening, it is usually over in minutes. If the reaction is not a reaction to medications, additional workup may have to be done to rule out other causes. HOME CARE INSTRUCTIONS   Generally, after the reaction is over, there will be no return of the disorder.  In the future, avoid use of medications that were thought to be the cause of this.  Do not drive or perform tasks after treatment until medications used to treat have worn off, or until approved by your caregiver.  See your caregiver if there is a return of the symptoms which brought you to your caregiver or emergency department. MAKE SURE YOU:   Understand these instructions.  Will watch your condition.  Will get help right away if you are not doing well or get worse. Document Released: 05/11/2000 Document Revised: 09/28/2013 Document Reviewed: 12/31/2007 [facility] Patient Information 2015 [place], [place]. This information  is not intended to replace advice given to you by your health care provider. Make sure you discuss any questions you have with your health care provider.

## 2014-08-18 NOTE — Discharge Instructions (Signed)

## 2014-08-18 NOTE — ED Provider Notes (Signed)
CSN: [id number]     Arrival date & time 08/18/14  0335 History  This chart was scribed for Leonard Flemings, MD by Delphia Grates, ED Scribe. This patient was seen in room A09C/A09C and the patient's care was started at 3:55 AM.    Chief Complaint  Patient presents with  . Medication Reaction   The history is provided by the patient and a relative. No language interpreter was used.     HPI Comments: [name] is a 39 y.o. male, with history of pancreatitis and kidney stones, brought in by ambulance, who presents to the Emergency Department complaining of a possible medication reaction PTA. Patient states he was seen at [facility] for chronic pancreatitis and was given 3 doses of Dilaudid, 1 dose of Zofran, and 1 dose of haldol. Patient states this was first time receiving haldol. Approximately 30 minutes after receiving this medication, family states the patient's jaw clenched and he became diaphoretic. He reports taking 24mL of benadryl at home 1 hour ago and was given 25 mg of benadryl by EMS, en route the ED. There is associated constant 8/10 jaw pain and 7/10 abdominal pain related to his pancreatis. He notes his symptoms are currently subsiding at this time. Patient is established with a pain clinic and notes his next appointment with pain management is in the next 24 hours.    Past Medical History  Diagnosis Date  . Pancreatitis   . Kidney stones    Past Surgical History  Procedure Laterality Date  . Appendectomy    . Cholecystectomy    . Kidney stone surgery    . Ercp w/ metal stent placement     No family history on file. History  Substance Use Topics  . Smoking status: Current Every Day Smoker -- 0.50 packs/day    Types: Cigarettes  . Smokeless tobacco: Never Used  . Alcohol Use: Yes     Comment: rarely, pt stated that he drinks once ever 3 months and it is usually a beer at dinner    Review of Systems  Constitutional: Positive for diaphoresis.  HENT:       Jaw pain   Gastrointestinal: Positive for abdominal pain.  All other systems reviewed and are negative.     Allergies  Cortisone; Eggs or egg-derived products; Ketorolac tromethamine; and Haldol  Home Medications   Prior to Admission medications   Medication Sig Start Date End Date Taking? Authorizing Provider  Cyanocobalamin (B-12 PO) Take 1 tablet by mouth daily.    Historical Provider, MD  Doxylamine-Phenylephrine-APAP (VICKS NYQUIL SINUS) 6.25-5-325 MG CAPS Take 1 capsule by mouth daily as needed (for sinus).    Historical Provider, MD  famotidine (PEPCID) 20 MG tablet Take 1 tablet (20 mg total) by mouth 2 (two) times daily. 03/23/14   Fredia Sorrow, MD  fentaNYL (DURAGESIC - DOSED MCG/HR) 25 MCG/HR Place 50 mcg onto the skin every other day.     Historical Provider, MD  HYDROcodone-acetaminophen (NORCO/VICODIN) 5-325 MG per tablet Take 1-2 tablets by mouth every 6 (six) hours as needed for moderate pain. 03/23/14   Fredia Sorrow, MD  Melatonin 5 MG TABS Take 5 mg by mouth at bedtime as needed (for sleep).    Historical Provider, MD  promethazine (PHENERGAN) 25 MG tablet Take 1 tablet (25 mg total) by mouth every 6 (six) hours as needed for nausea or vomiting. 07/17/14   Shanon Rosser, MD  sucralfate (CARAFATE) 1 GM/10ML suspension Take 10 mLs (1 g total) by mouth 4 (  four) times daily -  with meals and at bedtime. 05/04/14   Quintella Reichert, MD  tapentadol (NUCYNTA) 50 MG TABS Take 100 mg by mouth every 6 (six) hours as needed for moderate pain.     Historical Provider, MD   Triage Vitals: BP 142/97 mmHg  Pulse 97  Temp(Src) 98.4 F (36.9 C) (Oral)  Resp 15  Ht 6\' 1"  (1.854 m)  Wt 205 lb (92.987 kg)  BMI 27.05 kg/m2  SpO2 96%  Physical Exam  Constitutional: He is oriented to person, place, and time. He appears well-developed and well-nourished. No distress.  HENT:  Head: Normocephalic and atraumatic.  Nose: Nose normal.  Mouth/Throat: Oropharynx is clear and moist.  Eyes:  Conjunctivae and EOM are normal. Pupils are equal, round, and reactive to light.  Neck: Normal range of motion. Neck supple. No JVD present. No tracheal deviation present. No thyromegaly present.  Cardiovascular: Regular rhythm, normal heart sounds and intact distal pulses.  Exam reveals no gallop and no friction rub.   No murmur heard. Tachycardia  Pulmonary/Chest: Effort normal and breath sounds normal. No stridor. No respiratory distress. He has no wheezes. He has no rales. He exhibits no tenderness.  Abdominal: Soft. Bowel sounds are normal. He exhibits no distension and no mass. There is no tenderness. There is no rebound and no guarding.  Musculoskeletal: Normal range of motion. He exhibits no edema or tenderness.  Lymphadenopathy:    He has no cervical adenopathy.  Neurological: He is alert and oriented to person, place, and time. He displays normal reflexes. He exhibits normal muscle tone. Coordination normal.  Skin: Skin is warm and dry. No rash noted. No erythema. No pallor.  Psychiatric: He has a normal mood and affect. His behavior is normal. Judgment and thought content normal.  Anxious appearing  Nursing note and vitals reviewed.   ED Course  Procedures (including critical care time)  DIAGNOSTIC STUDIES: Oxygen Saturation is 96% on room air, adequate by my interpretation.    COORDINATION OF CARE: At 58 Discussed treatment plan with patient which includes Ativan and Robaxin. Patient agrees.    Labs Review Labs Reviewed - No data to display  Imaging Review No results found.   EKG Interpretation None      MDM   Final diagnoses:  Dystonic drug reaction    39 year old male with dystonic reaction to Haldol.  Patient has received adequate dosing of Benadryl.  No further reaction aside from a sense of anxiety and some jaw pain.  Will treat with Ativan and Robaxin  I personally performed the services described in this documentation, which was scribed in my  presence. The recorded information has been reviewed and is accurate.  4:50 AM Patient reassessed.  He reports the Ativan has helped, now receiving Robaxin.  We'll continue to monitor.     Leonard Flemings, MD 08/18/14 [phone]

## 2014-10-17 ENCOUNTER — Emergency Department (HOSPITAL_BASED_OUTPATIENT_CLINIC_OR_DEPARTMENT_OTHER)
Admission: EM | Admit: 2014-10-17 | Discharge: 2014-10-17 | Disposition: A | Discharge status: Home / Self Care | Payer: BLUE CROSS/BLUE SHIELD | Attending: Emergency Medicine | Admitting: Emergency Medicine

## 2014-10-17 ENCOUNTER — Encounter (HOSPITAL_BASED_OUTPATIENT_CLINIC_OR_DEPARTMENT_OTHER): Payer: Self-pay | Admitting: *Deleted

## 2014-10-17 DIAGNOSIS — Z8719 Personal history of other diseases of the digestive system: Secondary | ICD-10-CM

## 2014-10-17 DIAGNOSIS — Z720 Tobacco use: Secondary | ICD-10-CM | POA: Diagnosis not present

## 2014-10-17 DIAGNOSIS — Z79899 Other long term (current) drug therapy: Secondary | ICD-10-CM | POA: Diagnosis not present

## 2014-10-17 DIAGNOSIS — Z87442 Personal history of urinary calculi: Secondary | ICD-10-CM | POA: Diagnosis not present

## 2014-10-17 DIAGNOSIS — G8929 Other chronic pain: Secondary | ICD-10-CM | POA: Insufficient documentation

## 2014-10-17 DIAGNOSIS — R1013 Epigastric pain: Secondary | ICD-10-CM | POA: Diagnosis present

## 2014-10-17 DIAGNOSIS — K861 Other chronic pancreatitis: Secondary | ICD-10-CM | POA: Diagnosis not present

## 2014-10-17 LAB — URINE MICROSCOPIC-ADD ON

## 2014-10-17 LAB — BASIC METABOLIC PANEL
Anion gap: 9 (ref 5–15)
BUN: 13 mg/dL (ref 6–20)
CHLORIDE: 106 mmol/L (ref 101–111)
CO2: 26 mmol/L (ref 22–32)
Calcium: 9.3 mg/dL (ref 8.9–10.3)
Creatinine, Ser: 0.77 mg/dL (ref 0.61–1.24)
GFR calc Af Amer: >60 mL/min (ref >60)
Glucose, Bld: 105 mg/dL — ABNORMAL HIGH (ref 65–99)
POTASSIUM: 3.9 mmol/L (ref 3.5–5.1)
Sodium: 141 mmol/L (ref 135–145)

## 2014-10-17 LAB — CBC WITH DIFFERENTIAL/PLATELET
Basophils Absolute: 0.1 10*3/uL (ref 0.0–0.1)
Basophils Relative: 1 % (ref 0–1)
Eosinophils Absolute: 0.2 10*3/uL (ref 0.0–0.7)
Eosinophils Relative: 2 % (ref 0–5)
HEMATOCRIT: 43.9 % (ref 39.0–52.0)
Hemoglobin: 15.2 g/dL (ref 13.0–17.0)
LYMPHS ABS: 2.6 10*3/uL (ref 0.7–4.0)
Lymphocytes Relative: 27 % (ref 12–46)
MCH: 29.2 pg (ref 26.0–34.0)
MCHC: 34.6 g/dL (ref 30.0–36.0)
MCV: 84.4 fL (ref 78.0–100.0)
Monocytes Absolute: 0.8 10*3/uL (ref 0.1–1.0)
Monocytes Relative: 9 % (ref 3–12)
NEUTROS PCT: 61 % (ref 43–77)
Neutro Abs: 5.8 10*3/uL (ref 1.7–7.7)
Platelets: 210 10*3/uL (ref 150–400)
RBC: 5.2 MIL/uL (ref 4.22–5.81)
RDW: 12.4 % (ref 11.5–15.5)
WBC: 9.4 10*3/uL (ref 4.0–10.5)

## 2014-10-17 LAB — URINALYSIS, ROUTINE W REFLEX MICROSCOPIC
Bilirubin Urine: NEGATIVE
Glucose, UA: NEGATIVE mg/dL
HGB URINE DIPSTICK: NEGATIVE
Ketones, ur: NEGATIVE mg/dL
NITRITE: NEGATIVE
PROTEIN: NEGATIVE mg/dL
Specific Gravity, Urine: 1.011 (ref 1.005–1.030)
UROBILINOGEN UA: 1 mg/dL (ref 0.0–1.0)
pH: 7.5 (ref 5.0–8.0)

## 2014-10-17 LAB — LIPASE, BLOOD: Lipase: 34 U/L (ref 22–51)

## 2014-10-17 MED ORDER — HYDROMORPHONE HCL 1 MG/ML IJ SOLN
2.0000 mg | Freq: Once | INTRAMUSCULAR | Status: AC
  Administered 2014-10-17: 2 mg via INTRAVENOUS
  Filled 2014-10-17: qty 2

## 2014-10-17 MED ORDER — PROMETHAZINE HCL 25 MG/ML IJ SOLN
12.5000 mg | Freq: Once | INTRAMUSCULAR | Status: AC
  Administered 2014-10-17: 12.5 mg via INTRAVENOUS
  Filled 2014-10-17: qty 1

## 2014-10-17 NOTE — ED Provider Notes (Signed)
CSN: [id number]     Arrival date & time 10/17/14  0111 History   First MD Initiated Contact with Patient 10/17/14 0239     Chief Complaint  Patient presents with  . Abdominal Pain     (Consider location/radiation/quality/duration/timing/severity/associated sxs/prior Treatment) HPI Comments: Patient is a 39 year old male with history of chronic pancreatitis, status post cholecystectomy and biliary stent placement at [facility]. He is in pain management, however has occasional flareups which require ER visits. His pain started yesterday afternoon and has rapidly worsened. He denies any fevers or chills. He denies nausea or vomiting. He denies any bowel or bladder complaints. This pain feels identical to his prior pancreatitis pain.  Patient is a 39 y.o. male presenting with abdominal pain. The history is provided by the patient.  Abdominal Pain Pain location:  Epigastric Pain quality: cramping   Pain radiates to:  Does not radiate Pain severity:  Severe Onset quality:  Sudden Duration:  12 hours Timing:  Constant Progression:  Worsening Chronicity:  Chronic Relieved by:  Nothing Worsened by:  Nothing tried   Past Medical History  Diagnosis Date  . Pancreatitis   . Kidney stones    Past Surgical History  Procedure Laterality Date  . Appendectomy    . Cholecystectomy    . Kidney stone surgery    . Ercp w/ metal stent placement     No family history on file. History  Substance Use Topics  . Smoking status: Current Every Day Smoker -- 0.50 packs/day    Types: Cigarettes  . Smokeless tobacco: Never Used  . Alcohol Use: Yes     Comment: rarely, pt stated that he drinks once ever 3 months and it is usually a beer at dinner    Review of Systems  Gastrointestinal: Positive for abdominal pain.  All other systems reviewed and are negative.     Allergies  Cortisone; Eggs or egg-derived products; Ketorolac tromethamine; and Haldol  Home Medications   Prior to Admission  medications   Medication Sig Start Date End Date Taking? Authorizing Provider  Cyanocobalamin (B-12 PO) Take 1 tablet by mouth daily.    Historical Provider, MD  diphenhydrAMINE (BENADRYL) 25 MG tablet Take 1 tablet (25 mg total) by mouth every 6 (six) hours. For the next 3 days 08/18/14   Linton Flemings, MD  Doxylamine-Phenylephrine-APAP [facility] NYQUIL SINUS) 6.25-5-325 MG CAPS Take 1 capsule by mouth daily as needed (for sinus).    Historical Provider, MD  famotidine (PEPCID) 20 MG tablet Take 1 tablet (20 mg total) by mouth 2 (two) times daily. Patient not taking: Reported on 08/18/2014 03/23/14   Fredia Sorrow, MD  fentaNYL (DURAGESIC - DOSED MCG/HR) 25 MCG/HR Place 50 mcg onto the skin every other day.     Historical Provider, MD  fentaNYL (DURAGESIC - DOSED MCG/HR) 50 MCG/HR Place 50 mcg onto the skin every 3 (three) days.    Historical Provider, MD  HYDROcodone-acetaminophen (NORCO/VICODIN) 5-325 MG per tablet Take 1-2 tablets by mouth every 6 (six) hours as needed for moderate pain. Patient not taking: Reported on 08/18/2014 03/23/14   Fredia Sorrow, MD  Melatonin 5 MG TABS Take 10 mg by mouth at bedtime as needed (for sleep).     Historical Provider, MD  methocarbamol (ROBAXIN-750) 750 MG tablet Take 1 tablet (750 mg total) by mouth every 6 (six) hours as needed for muscle spasms. 08/18/14   Linton Flemings, MD  promethazine (PHENERGAN) 25 MG tablet Take 1 tablet (25 mg total) by mouth every  6 (six) hours as needed for nausea or vomiting. Patient not taking: Reported on 08/18/2014 07/17/14   Shanon Rosser, MD  sucralfate (CARAFATE) 1 GM/10ML suspension Take 10 mLs (1 g total) by mouth 4 (four) times daily -  with meals and at bedtime. Patient taking differently: Take 1 g by mouth 3 (three) times daily as needed (cronic pantiritious).  05/04/14   Quintella Reichert, MD  tapentadol (NUCYNTA) 50 MG TABS Take 100 mg by mouth every 6 (six) hours as needed for moderate pain.     Historical Provider, MD  Tapentadol  HCl 75 MG TABS Take 75 mg by mouth every 4 (four) hours as needed (pain).    Historical Provider, MD   BP 150/116 mmHg  Pulse 113  Temp(Src) 98 F (36.7 C)  Resp 20  SpO2 100% Physical Exam  Constitutional: He is oriented to person, place, and time. He appears well-developed and well-nourished. No distress.  HENT:  Head: Normocephalic and atraumatic.  Neck: Normal range of motion. Neck supple.  Cardiovascular: Normal rate, regular rhythm and normal heart sounds.   No murmur heard. Pulmonary/Chest: Effort normal and breath sounds normal. No respiratory distress. He has no wheezes. He has no rales.  Abdominal: Soft. Bowel sounds are normal. He exhibits no distension. There is tenderness. There is no rebound and no guarding.  There is tenderness to palpation in the epigastrium and left upper quadrant.  Neurological: He is alert and oriented to person, place, and time.  Skin: Skin is warm and dry. He is not diaphoretic.  Nursing note and vitals reviewed.   ED Course  Procedures (including critical care time) Labs Review Labs Reviewed  CBC WITH DIFFERENTIAL/PLATELET  BASIC METABOLIC PANEL  URINALYSIS, ROUTINE W REFLEX MICROSCOPIC  LIPASE, BLOOD    Imaging Review No results found.   EKG Interpretation None      MDM   Final diagnoses:  None    Patient presents with a flareup of his chronic pancreatitis. He was given IV pain medication and fluids and is feeling better. He will be discharged with instructions to follow-up with his pain management and GI doctors as usual.    Veryl Speak, MD 10/17/14 [phone]

## 2014-10-17 NOTE — ED Notes (Signed)
C/o left upper quad abd pain that started 4.5 hours ago. States pain is sharp and constant. States he has a hx of pancreatitis. C/o n/v. Denies diarrhea or fevers.

## 2014-10-17 NOTE — Discharge Instructions (Signed)
Follow-up with your pain management physicians and gastroenterologist as scheduled.   Abdominal Pain Many things can cause abdominal pain. Usually, abdominal pain is not caused by a disease and will improve without treatment. It can often be observed and treated at home. Your health care provider will do a physical exam and possibly order blood tests and X-rays to help determine the seriousness of your pain. However, in many cases, more time must pass before a clear cause of the pain can be found. Before that point, your health care provider may not know if you need more testing or further treatment. HOME CARE INSTRUCTIONS  Monitor your abdominal pain for any changes. The following actions may help to alleviate any discomfort you are experiencing:  Only take over-the-counter or prescription medicines as directed by your health care provider.  Do not take laxatives unless directed to do so by your health care provider.  Try a clear liquid diet (broth, tea, or water) as directed by your health care provider. Slowly move to a bland diet as tolerated. SEEK MEDICAL CARE IF:  You have unexplained abdominal pain.  You have abdominal pain associated with nausea or diarrhea.  You have pain when you urinate or have a bowel movement.  You experience abdominal pain that wakes you in the night.  You have abdominal pain that is worsened or improved by eating food.  You have abdominal pain that is worsened with eating fatty foods.  You have a fever. SEEK IMMEDIATE MEDICAL CARE IF:   Your pain does not go away within 2 hours.  You keep throwing up (vomiting).  Your pain is felt only in portions of the abdomen, such as the right side or the left lower portion of the abdomen.  You pass bloody or black tarry stools. MAKE SURE YOU:  Understand these instructions.   Will watch your condition.   Will get help right away if you are not doing well or get worse.  Document Released: 02/21/2005  Document Revised: 05/19/2013 Document Reviewed: 01/21/2013 [facility] Patient Information 2015 [place], [place]. This information is not intended to replace advice given to you by your health care provider. Make sure you discuss any questions you have with your health care provider.

## 2014-10-17 NOTE — ED Notes (Signed)
MD with pt  

## 2014-11-09 ENCOUNTER — Emergency Department (HOSPITAL_BASED_OUTPATIENT_CLINIC_OR_DEPARTMENT_OTHER)
Admission: EM | Admit: 2014-11-09 | Discharge: 2014-11-09 | Disposition: A | Discharge status: Home / Self Care | Payer: BLUE CROSS/BLUE SHIELD | Attending: Emergency Medicine | Admitting: Emergency Medicine

## 2014-11-09 ENCOUNTER — Encounter (HOSPITAL_BASED_OUTPATIENT_CLINIC_OR_DEPARTMENT_OTHER): Payer: Self-pay | Admitting: Emergency Medicine

## 2014-11-09 DIAGNOSIS — R1013 Epigastric pain: Secondary | ICD-10-CM | POA: Diagnosis present

## 2014-11-09 DIAGNOSIS — K861 Other chronic pancreatitis: Secondary | ICD-10-CM | POA: Insufficient documentation

## 2014-11-09 DIAGNOSIS — Z87442 Personal history of urinary calculi: Secondary | ICD-10-CM | POA: Insufficient documentation

## 2014-11-09 DIAGNOSIS — Z720 Tobacco use: Secondary | ICD-10-CM | POA: Insufficient documentation

## 2014-11-09 DIAGNOSIS — G8929 Other chronic pain: Secondary | ICD-10-CM | POA: Insufficient documentation

## 2014-11-09 DIAGNOSIS — Z79899 Other long term (current) drug therapy: Secondary | ICD-10-CM | POA: Diagnosis not present

## 2014-11-09 LAB — COMPREHENSIVE METABOLIC PANEL
ALBUMIN: 3.9 g/dL (ref 3.5–5.0)
ALK PHOS: 61 U/L (ref 38–126)
ALT: 32 U/L (ref 17–63)
AST: 29 U/L (ref 15–41)
Anion gap: 5 (ref 5–15)
BILIRUBIN TOTAL: 0.5 mg/dL (ref 0.3–1.2)
BUN: 23 mg/dL — ABNORMAL HIGH (ref 6–20)
CHLORIDE: 114 mmol/L — AB (ref 101–111)
CO2: 21 mmol/L — ABNORMAL LOW (ref 22–32)
Calcium: 9.3 mg/dL (ref 8.9–10.3)
Creatinine, Ser: 0.88 mg/dL (ref 0.61–1.24)
GFR calc Af Amer: >60 mL/min (ref >60)
GFR calc non Af Amer: >60 mL/min (ref >60)
Glucose, Bld: 155 mg/dL — ABNORMAL HIGH (ref 65–99)
POTASSIUM: 3.4 mmol/L — AB (ref 3.5–5.1)
Sodium: 140 mmol/L (ref 135–145)
Total Protein: 6.6 g/dL (ref 6.5–8.1)

## 2014-11-09 LAB — CBC WITH DIFFERENTIAL/PLATELET
BASOS PCT: 1 % (ref 0–1)
Basophils Absolute: 0.1 10*3/uL (ref 0.0–0.1)
EOS PCT: 2 % (ref 0–5)
Eosinophils Absolute: 0.2 10*3/uL (ref 0.0–0.7)
HCT: 41.3 % (ref 39.0–52.0)
HEMOGLOBIN: 14.7 g/dL (ref 13.0–17.0)
LYMPHS PCT: 25 % (ref 12–46)
Lymphs Abs: 2.9 10*3/uL (ref 0.7–4.0)
MCH: 30.1 pg (ref 26.0–34.0)
MCHC: 35.6 g/dL (ref 30.0–36.0)
MCV: 84.5 fL (ref 78.0–100.0)
Monocytes Absolute: 1.3 10*3/uL — ABNORMAL HIGH (ref 0.1–1.0)
Monocytes Relative: 11 % (ref 3–12)
Neutro Abs: 7.1 10*3/uL (ref 1.7–7.7)
Neutrophils Relative %: 61 % (ref 43–77)
Platelets: 225 10*3/uL (ref 150–400)
RBC: 4.89 MIL/uL (ref 4.22–5.81)
RDW: 12.7 % (ref 11.5–15.5)
WBC: 11.6 10*3/uL — ABNORMAL HIGH (ref 4.0–10.5)

## 2014-11-09 LAB — LIPASE, BLOOD: LIPASE: 25 U/L (ref 22–51)

## 2014-11-09 MED ORDER — HYDROMORPHONE HCL 1 MG/ML IJ SOLN
1.0000 mg | Freq: Once | INTRAMUSCULAR | Status: AC
Start: 2014-11-09 — End: 2014-11-09
  Administered 2014-11-09: 1 mg via INTRAVENOUS

## 2014-11-09 MED ORDER — HYDROMORPHONE HCL 1 MG/ML IJ SOLN
INTRAMUSCULAR | Status: AC
  Administered 2014-11-09: 1 mg via INTRAVENOUS
  Filled 2014-11-09: qty 1

## 2014-11-09 MED ORDER — HYDROMORPHONE HCL 1 MG/ML IJ SOLN
2.0000 mg | Freq: Once | INTRAMUSCULAR | Status: AC
Start: 2014-11-09 — End: 2014-11-09
  Administered 2014-11-09: 2 mg via INTRAVENOUS
  Filled 2014-11-09: qty 2

## 2014-11-09 MED ORDER — PROMETHAZINE HCL 25 MG/ML IJ SOLN
25.0000 mg | Freq: Once | INTRAMUSCULAR | Status: AC
  Administered 2014-11-09: 25 mg via INTRAVENOUS
  Filled 2014-11-09: qty 1

## 2014-11-09 MED ORDER — SODIUM CHLORIDE 0.9 % IV BOLUS (SEPSIS)
1000.0000 mL | Freq: Once | INTRAVENOUS | Status: AC
  Administered 2014-11-09: 1000 mL via INTRAVENOUS

## 2014-11-09 MED ORDER — HYDROMORPHONE HCL 1 MG/ML IJ SOLN
1.0000 mg | Freq: Once | INTRAMUSCULAR | Status: AC
  Administered 2014-11-09: 1 mg via INTRAVENOUS
  Filled 2014-11-09: qty 1

## 2014-11-09 NOTE — ED Notes (Signed)
Wife to drive patient home Pt declined wheelchair. Gait is steady at discharge.

## 2014-11-09 NOTE — ED Notes (Signed)
MD at bedside. 

## 2014-11-09 NOTE — ED Notes (Signed)
Pt reports feeling like his potassium bottomed out and from there began to have abdominal pain with N/V. Took 12.5 pherengan and 10 mg oxycodone.

## 2014-11-09 NOTE — ED Notes (Signed)
Ginger Ale given to pt .

## 2014-11-09 NOTE — ED Provider Notes (Signed)
CSN: [id number]     Arrival date & time 11/09/14  0109 History   First MD Initiated Contact with Patient 11/09/14 0125     Chief Complaint  Patient presents with  . Abdominal Pain     (Consider location/radiation/quality/duration/timing/severity/associated sxs/prior Treatment) HPI  This is a 39 year old male with a history of pancreatitis and kidney stones. He is on chronic high-dose narcotics for chronic pain. He is here with an acute exacerbation that started about 9 PM yesterday evening. It is characterized by severe epigastric pain consistent with prior pancreatitis. He has also had nausea and vomiting. He rates his pain as severe and is worse with movement or palpation. He attempted to take 12.5 milligrams of Phenergan and 10 milligrams of oxycodone but threw these back up. He has also had muscle cramping for the past 2 days and suspects his potassium is low. He did take extra potassium supplement yesterday.  Past Medical History  Diagnosis Date  . Pancreatitis   . Kidney stones    Past Surgical History  Procedure Laterality Date  . Appendectomy    . Cholecystectomy    . Kidney stone surgery    . Ercp w/ metal stent placement     No family history on file. History  Substance Use Topics  . Smoking status: Current Every Day Smoker -- 0.50 packs/day    Types: Cigarettes  . Smokeless tobacco: Never Used  . Alcohol Use: Yes     Comment: rarely, pt stated that he drinks once ever 3 months and it is usually a beer at dinner    Review of Systems  All other systems reviewed and are negative.   Allergies  Cortisone; Eggs or egg-derived products; Ketorolac tromethamine; and Haldol  Home Medications   Prior to Admission medications   Medication Sig Start Date End Date Taking? Authorizing Provider  HYDROcodone-acetaminophen (NORCO/VICODIN) 5-325 MG per tablet Take 1-2 tablets by mouth every 6 (six) hours as needed for moderate pain. 03/23/14  Yes Fredia Sorrow, MD   promethazine (PHENERGAN) 25 MG tablet Take 1 tablet (25 mg total) by mouth every 6 (six) hours as needed for nausea or vomiting. 07/17/14  Yes Shanon Rosser, MD  Cyanocobalamin (B-12 PO) Take 1 tablet by mouth daily.    Historical Provider, MD  diphenhydrAMINE (BENADRYL) 25 MG tablet Take 1 tablet (25 mg total) by mouth every 6 (six) hours. For the next 3 days 08/18/14   Linton Flemings, MD  Doxylamine-Phenylephrine-APAP [facility] NYQUIL SINUS) 6.25-5-325 MG CAPS Take 1 capsule by mouth daily as needed (for sinus).    Historical Provider, MD  famotidine (PEPCID) 20 MG tablet Take 1 tablet (20 mg total) by mouth 2 (two) times daily. Patient not taking: Reported on 08/18/2014 03/23/14   Fredia Sorrow, MD  fentaNYL (DURAGESIC - DOSED MCG/HR) 25 MCG/HR Place 50 mcg onto the skin every other day.     Historical Provider, MD  fentaNYL (DURAGESIC - DOSED MCG/HR) 50 MCG/HR Place 50 mcg onto the skin every 3 (three) days.    Historical Provider, MD  Melatonin 5 MG TABS Take 10 mg by mouth at bedtime as needed (for sleep).     Historical Provider, MD  methocarbamol (ROBAXIN-750) 750 MG tablet Take 1 tablet (750 mg total) by mouth every 6 (six) hours as needed for muscle spasms. 08/18/14   Linton Flemings, MD  sucralfate (CARAFATE) 1 GM/10ML suspension Take 10 mLs (1 g total) by mouth 4 (four) times daily -  with meals and at bedtime. Patient  taking differently: Take 1 g by mouth 3 (three) times daily as needed (cronic pantiritious).  05/04/14   Quintella Reichert, MD  tapentadol (NUCYNTA) 50 MG TABS Take 100 mg by mouth every 6 (six) hours as needed for moderate pain.     Historical Provider, MD  Tapentadol HCl 75 MG TABS Take 75 mg by mouth every 4 (four) hours as needed (pain).    Historical Provider, MD   BP 152/80 mmHg  Pulse 116  Temp(Src) 98.3 F (36.8 C) (Oral)  Resp 20  Ht 6\' 1"  (1.854 m)  Wt 200 lb (90.719 kg)  BMI 26.39 kg/m2  SpO2 98%   Physical Exam  General: Well-developed, well-nourished male in no acute  distress; appearance consistent with age of record HENT: normocephalic; atraumatic Eyes: pupils equal, round and reactive to light; extraocular muscles intact Neck: supple Heart: regular rate and rhythm Lungs: clear to auscultation bilaterally Abdomen: soft; nondistended; epigastric tenderness; no masses or hepatosplenomegaly; bowel sounds present Extremities: No deformity; full range of motion; pulses normal Neurologic: Awake, alert and oriented; motor function intact in all extremities and symmetric; no facial droop Skin: Warm and dry Psychiatric: Flat affect    ED Course  Procedures (including critical care time)   MDM  Nursing notes and vitals signs, including pulse oximetry, reviewed.  Summary of this visit's results, reviewed by myself:  Labs:  Results for orders placed or performed during the hospital encounter of 11/09/14 (from the past 24 hour(s))  Comprehensive metabolic panel     Status: Abnormal   Collection Time: 11/09/14  1:44 AM  Result Value Ref Range   Sodium 140 135 - 145 mmol/L   Potassium 3.4 (L) 3.5 - 5.1 mmol/L   Chloride 114 (H) 101 - 111 mmol/L   CO2 21 (L) 22 - 32 mmol/L   Glucose, Bld 155 (H) 65 - 99 mg/dL   BUN 23 (H) 6 - 20 mg/dL   Creatinine, Ser 0.88 0.61 - 1.24 mg/dL   Calcium 9.3 8.9 - 10.3 mg/dL   Total Protein 6.6 6.5 - 8.1 g/dL   Albumin 3.9 3.5 - 5.0 g/dL   AST 29 15 - 41 U/L   ALT 32 17 - 63 U/L   Alkaline Phosphatase 61 38 - 126 U/L   Total Bilirubin 0.5 0.3 - 1.2 mg/dL   GFR calc non Af Amer >60 >60 mL/min   GFR calc Af Amer >60 >60 mL/min   Anion gap 5 5 - 15  Lipase, blood     Status: None   Collection Time: 11/09/14  1:44 AM  Result Value Ref Range   Lipase 25 22 - 51 U/L  CBC with Differential/Platelet     Status: Abnormal   Collection Time: 11/09/14  1:44 AM  Result Value Ref Range   WBC 11.6 (H) 4.0 - 10.5 K/uL   RBC 4.89 4.22 - 5.81 MIL/uL   Hemoglobin 14.7 13.0 - 17.0 g/dL   HCT 41.3 39.0 - 52.0 %   MCV 84.5 78.0 -  100.0 fL   MCH 30.1 26.0 - 34.0 pg   MCHC 35.6 30.0 - 36.0 g/dL   RDW 12.7 11.5 - 15.5 %   Platelets 225 150 - 400 K/uL   Neutrophils Relative % 61 43 - 77 %   Lymphocytes Relative 25 12 - 46 %   Monocytes Relative 11 3 - 12 %   Eosinophils Relative 2 0 - 5 %   Basophils Relative 1 0 - 1 %  Neutro Abs 7.1 1.7 - 7.7 K/uL   Lymphs Abs 2.9 0.7 - 4.0 K/uL   Monocytes Absolute 1.3 (H) 0.1 - 1.0 K/uL   Eosinophils Absolute 0.2 0.0 - 0.7 K/uL   Basophils Absolute 0.1 0.0 - 0.1 K/uL   Smear Review MORPHOLOGY UNREMARKABLE    3:08 AM Able to drink fluids without emesis. Pain is improving with IV medications. He is ready to try going home and returning to his oral medications.        Shanon Rosser, MD 11/09/14 [phone]

## 2014-12-11 ENCOUNTER — Encounter (HOSPITAL_BASED_OUTPATIENT_CLINIC_OR_DEPARTMENT_OTHER): Payer: Self-pay | Admitting: Emergency Medicine

## 2014-12-11 ENCOUNTER — Emergency Department (HOSPITAL_BASED_OUTPATIENT_CLINIC_OR_DEPARTMENT_OTHER)
Admission: EM | Admit: 2014-12-11 | Discharge: 2014-12-11 | Disposition: A | Discharge status: Home / Self Care | Payer: BLUE CROSS/BLUE SHIELD | Attending: Emergency Medicine | Admitting: Emergency Medicine

## 2014-12-11 DIAGNOSIS — Z8719 Personal history of other diseases of the digestive system: Secondary | ICD-10-CM | POA: Diagnosis not present

## 2014-12-11 DIAGNOSIS — Z720 Tobacco use: Secondary | ICD-10-CM | POA: Insufficient documentation

## 2014-12-11 DIAGNOSIS — Z79899 Other long term (current) drug therapy: Secondary | ICD-10-CM | POA: Insufficient documentation

## 2014-12-11 DIAGNOSIS — R109 Unspecified abdominal pain: Secondary | ICD-10-CM

## 2014-12-11 DIAGNOSIS — R1013 Epigastric pain: Secondary | ICD-10-CM | POA: Diagnosis not present

## 2014-12-11 DIAGNOSIS — Z87442 Personal history of urinary calculi: Secondary | ICD-10-CM | POA: Insufficient documentation

## 2014-12-11 DIAGNOSIS — Z9089 Acquired absence of other organs: Secondary | ICD-10-CM | POA: Insufficient documentation

## 2014-12-11 LAB — URINALYSIS, ROUTINE W REFLEX MICROSCOPIC
GLUCOSE, UA: NEGATIVE mg/dL
Hgb urine dipstick: NEGATIVE
Ketones, ur: NEGATIVE mg/dL
Leukocytes, UA: NEGATIVE
Nitrite: NEGATIVE
Protein, ur: NEGATIVE mg/dL
Specific Gravity, Urine: 1.026 (ref 1.005–1.030)
Urobilinogen, UA: 0.2 mg/dL (ref 0.0–1.0)
pH: 5.5 (ref 5.0–8.0)

## 2014-12-11 LAB — COMPREHENSIVE METABOLIC PANEL
ALBUMIN: 4.1 g/dL (ref 3.5–5.0)
ALK PHOS: 60 U/L (ref 38–126)
ALT: 20 U/L (ref 17–63)
AST: 24 U/L (ref 15–41)
Anion gap: 8 (ref 5–15)
BILIRUBIN TOTAL: 0.3 mg/dL (ref 0.3–1.2)
BUN: 16 mg/dL (ref 6–20)
CHLORIDE: 107 mmol/L (ref 101–111)
CO2: 25 mmol/L (ref 22–32)
CREATININE: 0.84 mg/dL (ref 0.61–1.24)
Calcium: 9.1 mg/dL (ref 8.9–10.3)
GFR calc Af Amer: >60 mL/min (ref >60)
Glucose, Bld: 100 mg/dL — ABNORMAL HIGH (ref 65–99)
POTASSIUM: 3.4 mmol/L — AB (ref 3.5–5.1)
SODIUM: 140 mmol/L (ref 135–145)
Total Protein: 6.9 g/dL (ref 6.5–8.1)

## 2014-12-11 LAB — CBC WITH DIFFERENTIAL/PLATELET
BASOS ABS: 0.1 10*3/uL (ref 0.0–0.1)
Basophils Relative: 1 % (ref 0–1)
Eosinophils Absolute: 0.4 10*3/uL (ref 0.0–0.7)
Eosinophils Relative: 4 % (ref 0–5)
HCT: 41.2 % (ref 39.0–52.0)
Hemoglobin: 14.7 g/dL (ref 13.0–17.0)
Lymphocytes Relative: 40 % (ref 12–46)
Lymphs Abs: 4.3 10*3/uL — ABNORMAL HIGH (ref 0.7–4.0)
MCH: 29.6 pg (ref 26.0–34.0)
MCHC: 35.7 g/dL (ref 30.0–36.0)
MCV: 82.9 fL (ref 78.0–100.0)
MONOS PCT: 10 % (ref 3–12)
Monocytes Absolute: 1.1 10*3/uL — ABNORMAL HIGH (ref 0.1–1.0)
Neutro Abs: 4.8 10*3/uL (ref 1.7–7.7)
Neutrophils Relative %: 45 % (ref 43–77)
PLATELETS: 239 10*3/uL (ref 150–400)
RBC: 4.97 MIL/uL (ref 4.22–5.81)
RDW: 13 % (ref 11.5–15.5)
WBC: 10.6 10*3/uL — ABNORMAL HIGH (ref 4.0–10.5)

## 2014-12-11 LAB — LIPASE, BLOOD: LIPASE: 25 U/L (ref 22–51)

## 2014-12-11 MED ORDER — HYDROMORPHONE HCL 1 MG/ML IJ SOLN
2.0000 mg | Freq: Once | INTRAMUSCULAR | Status: AC
  Administered 2014-12-11: 2 mg via INTRAVENOUS
  Filled 2014-12-11: qty 2

## 2014-12-11 MED ORDER — SODIUM CHLORIDE 0.9 % IV SOLN
1000.0000 mL | Freq: Once | INTRAVENOUS | Status: AC
  Administered 2014-12-11: 1000 mL via INTRAVENOUS

## 2014-12-11 MED ORDER — SODIUM CHLORIDE 0.9 % IV SOLN: 1000.0000 mL | INTRAVENOUS | Status: DC

## 2014-12-11 MED ORDER — ONDANSETRON HCL 4 MG/2ML IJ SOLN
4.0000 mg | Freq: Once | INTRAMUSCULAR | Status: AC
  Administered 2014-12-11: 4 mg via INTRAVENOUS
  Filled 2014-12-11: qty 2

## 2014-12-11 NOTE — ED Notes (Signed)
MD at bedside. 

## 2014-12-11 NOTE — ED Notes (Signed)
Pt states that he has chronic pancreatitis and is followed by pain management in [place]

## 2014-12-11 NOTE — ED Notes (Signed)
Patient c/o pancreatits flare up that started about 90 minutes ago. Patient states the pain awakened him from sleep. Patient c/o nausea without vomiting. Patient denies ETOH tonight. Patient states he is wearing a 6mcg Fentanyl patch, last dose of Oxycodone at @ 0400. Patient states pain is 8/10. Family at bedside.

## 2014-12-11 NOTE — ED Provider Notes (Signed)
CSN: [id number]     Arrival date & time 12/11/14  0518 History   First MD Initiated Contact with Patient 12/11/14 0522     Chief Complaint  Patient presents with  . Abdominal Pain     (Consider location/radiation/quality/duration/timing/severity/associated sxs/prior Treatment) Patient is a 39 y.o. male presenting with abdominal pain. The history is provided by the patient.  Abdominal Pain He was awakened about 1.5 hours ago with severe epigastric pain radiating to his back. He has a history of pancreatitis and this pain is similar. Nothing makes pain better nothing makes it worse. There is associated nausea but no vomiting. He denies fever or chills. He denies alcohol ingestion. He has a fentanyl patch in place and took 2 doses of oxycodone-acetaminophen with no relief of pain.  Past Medical History  Diagnosis Date  . Pancreatitis   . Kidney stones    Past Surgical History  Procedure Laterality Date  . Appendectomy    . Cholecystectomy    . Kidney stone surgery    . Ercp w/ metal stent placement     History reviewed. No pertinent family history. History  Substance Use Topics  . Smoking status: Current Every Day Smoker -- 0.50 packs/day    Types: Cigarettes  . Smokeless tobacco: Never Used  . Alcohol Use: Yes     Comment: rarely, pt stated that he drinks once ever 3 months and it is usually a beer at dinner    Review of Systems  Gastrointestinal: Positive for abdominal pain.  All other systems reviewed and are negative.     Allergies  Cortisone; Eggs or egg-derived products; Ketorolac tromethamine; and Haldol  Home Medications   Prior to Admission medications   Medication Sig Start Date End Date Taking? Authorizing Provider  fentaNYL (DURAGESIC - DOSED MCG/HR) 50 MCG/HR Place 50 mcg onto the skin every 3 (three) days.   Yes Historical Provider, MD  oxyCODONE-acetaminophen (PERCOCET) 10-325 MG per tablet Take 1 tablet by mouth every 4 (four) hours as needed for pain.    Yes Historical Provider, MD  Cyanocobalamin (B-12 PO) Take 1 tablet by mouth daily.    Historical Provider, MD  diphenhydrAMINE (BENADRYL) 25 MG tablet Take 1 tablet (25 mg total) by mouth every 6 (six) hours. For the next 3 days 08/18/14   Linton Flemings, MD  Doxylamine-Phenylephrine-APAP [facility] NYQUIL SINUS) 6.25-5-325 MG CAPS Take 1 capsule by mouth daily as needed (for sinus).    Historical Provider, MD  famotidine (PEPCID) 20 MG tablet Take 1 tablet (20 mg total) by mouth 2 (two) times daily. Patient not taking: Reported on 08/18/2014 03/23/14   Fredia Sorrow, MD  fentaNYL (DURAGESIC - DOSED MCG/HR) 25 MCG/HR Place 50 mcg onto the skin every other day.     Historical Provider, MD  HYDROcodone-acetaminophen (NORCO/VICODIN) 5-325 MG per tablet Take 1-2 tablets by mouth every 6 (six) hours as needed for moderate pain. 03/23/14   Fredia Sorrow, MD  Melatonin 5 MG TABS Take 10 mg by mouth at bedtime as needed (for sleep).     Historical Provider, MD  methocarbamol (ROBAXIN-750) 750 MG tablet Take 1 tablet (750 mg total) by mouth every 6 (six) hours as needed for muscle spasms. 08/18/14   Linton Flemings, MD  promethazine (PHENERGAN) 25 MG tablet Take 1 tablet (25 mg total) by mouth every 6 (six) hours as needed for nausea or vomiting. 07/17/14   Shanon Rosser, MD  sucralfate (CARAFATE) 1 GM/10ML suspension Take 10 mLs (1 g total) by mouth  4 (four) times daily -  with meals and at bedtime. Patient taking differently: Take 1 g by mouth 3 (three) times daily as needed (cronic pantiritious).  05/04/14   Quintella Reichert, MD  tapentadol (NUCYNTA) 50 MG TABS Take 100 mg by mouth every 6 (six) hours as needed for moderate pain.     Historical Provider, MD  Tapentadol HCl 75 MG TABS Take 75 mg by mouth every 4 (four) hours as needed (pain).    Historical Provider, MD   BP 140/99 mmHg  Pulse 100  Temp(Src) 98.4 F (36.9 C) (Oral)  Resp 18  Ht 6\' 1"  (1.854 m)  Wt 215 lb (97.523 kg)  BMI 28.37 kg/m2  SpO2  100% Physical Exam  Nursing note and vitals reviewed.  39 year old male, who appears uncomfortable, but his in no acute distress. Vital signs are significant for hypertension. Oxygen saturation is 100%, which is normal. Head is normocephalic and atraumatic. PERRLA, EOMI. Oropharynx is clear. There is no scleral icterus. Neck is nontender and supple without adenopathy or JVD. Back is nontender and there is no CVA tenderness. Lungs are clear without rales, wheezes, or rhonchi. Chest is nontender. Heart has regular rate and rhythm without murmur. Abdomen is soft, flat, with moderate tenderness in the epigastric area. There is no rebound or guarding. There are no masses or hepatosplenomegaly and peristalsis is hypoactive. Extremities have no cyanosis or edema, full range of motion is present. Skin is warm and dry without rash. Neurologic: Mental status is normal, cranial nerves are intact, there are no motor or sensory deficits.  ED Course  Procedures (including critical care time) Labs Review Results for orders placed or performed during the hospital encounter of 12/11/14  Urinalysis, Routine w reflex microscopic (not at [facility])  Result Value Ref Range   Color, Urine YELLOW YELLOW   APPearance CLEAR CLEAR   Specific Gravity, Urine 1.026 1.005 - 1.030   pH 5.5 5.0 - 8.0   Glucose, UA NEGATIVE NEGATIVE mg/dL   Hgb urine dipstick NEGATIVE NEGATIVE   Bilirubin Urine SMALL (A) NEGATIVE   Ketones, ur NEGATIVE NEGATIVE mg/dL   Protein, ur NEGATIVE NEGATIVE mg/dL   Urobilinogen, UA 0.2 0.0 - 1.0 mg/dL   Nitrite NEGATIVE NEGATIVE   Leukocytes, UA NEGATIVE NEGATIVE  Comprehensive metabolic panel  Result Value Ref Range   Sodium 140 135 - 145 mmol/L   Potassium 3.4 (L) 3.5 - 5.1 mmol/L   Chloride 107 101 - 111 mmol/L   CO2 25 22 - 32 mmol/L   Glucose, Bld 100 (H) 65 - 99 mg/dL   BUN 16 6 - 20 mg/dL   Creatinine, Ser 0.84 0.61 - 1.24 mg/dL   Calcium 9.1 8.9 - 10.3 mg/dL   Total Protein 6.9  6.5 - 8.1 g/dL   Albumin 4.1 3.5 - 5.0 g/dL   AST 24 15 - 41 U/L   ALT 20 17 - 63 U/L   Alkaline Phosphatase 60 38 - 126 U/L   Total Bilirubin 0.3 0.3 - 1.2 mg/dL   GFR calc non Af Amer >60 >60 mL/min   GFR calc Af Amer >60 >60 mL/min   Anion gap 8 5 - 15  Lipase, blood  Result Value Ref Range   Lipase 25 22 - 51 U/L  CBC with Differential  Result Value Ref Range   WBC 10.6 (H) 4.0 - 10.5 K/uL   RBC 4.97 4.22 - 5.81 MIL/uL   Hemoglobin 14.7 13.0 - 17.0 g/dL   HCT  41.2 39.0 - 52.0 %   MCV 82.9 78.0 - 100.0 fL   MCH 29.6 26.0 - 34.0 pg   MCHC 35.7 30.0 - 36.0 g/dL   RDW 13.0 11.5 - 15.5 %   Platelets 239 150 - 400 K/uL   Neutrophils Relative % 45 43 - 77 %   Neutro Abs 4.8 1.7 - 7.7 K/uL   Lymphocytes Relative 40 12 - 46 %   Lymphs Abs 4.3 (H) 0.7 - 4.0 K/uL   Monocytes Relative 10 3 - 12 %   Monocytes Absolute 1.1 (H) 0.1 - 1.0 K/uL   Eosinophils Relative 4 0 - 5 %   Eosinophils Absolute 0.4 0.0 - 0.7 K/uL   Basophils Relative 1 0 - 1 %   Basophils Absolute 0.1 0.0 - 0.1 K/uL   WBC Morphology ATYPICAL LYMPHOCYTES    Smear Review PLATELET COUNT CONFIRMED BY SMEAR     MDM   Final diagnoses:  Abdominal pain, unspecified abdominal location    Abdominal pain consistent with recurrent pancreatitis. Old records are reviewed and he has any ED visits with similar complaints. He is usually treated with IV fluids, hydromorphone, and ondansetron. These will be given today.  Following initial dose of hydromorphone, he only had partial relief of pain. He was given a second dose of hydromorphone with excellent relief of pain and is discharged.  Delora Fuel, MD 38/88/28 0034

## 2014-12-11 NOTE — Discharge Instructions (Signed)

## 2015-01-01 ENCOUNTER — Emergency Department (HOSPITAL_BASED_OUTPATIENT_CLINIC_OR_DEPARTMENT_OTHER)
Admission: EM | Admit: 2015-01-01 | Discharge: 2015-01-01 | Disposition: A | Discharge status: Home / Self Care | Payer: BC Managed Care – PPO | Attending: Emergency Medicine | Admitting: Emergency Medicine

## 2015-01-01 ENCOUNTER — Encounter (HOSPITAL_BASED_OUTPATIENT_CLINIC_OR_DEPARTMENT_OTHER): Payer: Self-pay | Admitting: *Deleted

## 2015-01-01 DIAGNOSIS — Z87442 Personal history of urinary calculi: Secondary | ICD-10-CM | POA: Insufficient documentation

## 2015-01-01 DIAGNOSIS — Z79899 Other long term (current) drug therapy: Secondary | ICD-10-CM | POA: Insufficient documentation

## 2015-01-01 DIAGNOSIS — R109 Unspecified abdominal pain: Secondary | ICD-10-CM

## 2015-01-01 DIAGNOSIS — R1013 Epigastric pain: Secondary | ICD-10-CM | POA: Insufficient documentation

## 2015-01-01 DIAGNOSIS — Z720 Tobacco use: Secondary | ICD-10-CM | POA: Insufficient documentation

## 2015-01-01 DIAGNOSIS — G8929 Other chronic pain: Secondary | ICD-10-CM | POA: Insufficient documentation

## 2015-01-01 DIAGNOSIS — Z8719 Personal history of other diseases of the digestive system: Secondary | ICD-10-CM | POA: Insufficient documentation

## 2015-01-01 LAB — CBC WITH DIFFERENTIAL/PLATELET
BASOS PCT: 0 % (ref 0–1)
Basophils Absolute: 0.1 10*3/uL (ref 0.0–0.1)
EOS PCT: 1 % (ref 0–5)
Eosinophils Absolute: 0.1 10*3/uL (ref 0.0–0.7)
HCT: 45 % (ref 39.0–52.0)
HEMOGLOBIN: 16 g/dL (ref 13.0–17.0)
LYMPHS ABS: 2.3 10*3/uL (ref 0.7–4.0)
Lymphocytes Relative: 17 % (ref 12–46)
MCH: 29.4 pg (ref 26.0–34.0)
MCHC: 35.6 g/dL (ref 30.0–36.0)
MCV: 82.7 fL (ref 78.0–100.0)
Monocytes Absolute: 0.9 10*3/uL (ref 0.1–1.0)
Monocytes Relative: 7 % (ref 3–12)
NEUTROS PCT: 75 % (ref 43–77)
Neutro Abs: 10.5 10*3/uL — ABNORMAL HIGH (ref 1.7–7.7)
Platelets: 257 10*3/uL (ref 150–400)
RBC: 5.44 MIL/uL (ref 4.22–5.81)
RDW: 12.7 % (ref 11.5–15.5)
WBC: 13.9 10*3/uL — ABNORMAL HIGH (ref 4.0–10.5)

## 2015-01-01 LAB — COMPREHENSIVE METABOLIC PANEL
ALK PHOS: 53 U/L (ref 38–126)
ALT: 30 U/L (ref 17–63)
AST: 32 U/L (ref 15–41)
Albumin: 4.2 g/dL (ref 3.5–5.0)
Anion gap: 9 (ref 5–15)
BUN: 21 mg/dL — ABNORMAL HIGH (ref 6–20)
CALCIUM: 9.3 mg/dL (ref 8.9–10.3)
CO2: 25 mmol/L (ref 22–32)
Chloride: 104 mmol/L (ref 101–111)
Creatinine, Ser: 0.83 mg/dL (ref 0.61–1.24)
GFR calc non Af Amer: >60 mL/min (ref >60)
Glucose, Bld: 136 mg/dL — ABNORMAL HIGH (ref 65–99)
Potassium: 3.9 mmol/L (ref 3.5–5.1)
Sodium: 138 mmol/L (ref 135–145)
Total Bilirubin: 0.7 mg/dL (ref 0.3–1.2)
Total Protein: 7.3 g/dL (ref 6.5–8.1)

## 2015-01-01 LAB — LIPASE, BLOOD: Lipase: 27 U/L (ref 22–51)

## 2015-01-01 MED ORDER — HYDROMORPHONE HCL 1 MG/ML IJ SOLN
2.0000 mg | Freq: Once | INTRAMUSCULAR | Status: AC
  Administered 2015-01-01: 2 mg via INTRAVENOUS
  Filled 2015-01-01: qty 2

## 2015-01-01 MED ORDER — SODIUM CHLORIDE 0.9 % IV BOLUS (SEPSIS)
1000.0000 mL | Freq: Once | INTRAVENOUS | Status: AC
  Administered 2015-01-01: 1000 mL via INTRAVENOUS

## 2015-01-01 MED ORDER — ONDANSETRON HCL 4 MG/2ML IJ SOLN
4.0000 mg | Freq: Once | INTRAMUSCULAR | Status: AC
  Administered 2015-01-01: 4 mg via INTRAVENOUS
  Filled 2015-01-01: qty 2

## 2015-01-01 NOTE — ED Notes (Signed)
abd pain onset 0230 this am, vomited x 4 times  nausea

## 2015-01-01 NOTE — ED Provider Notes (Signed)
7:10 AM seen by me. Complains of typical pain from chronic pancreatitis which she suffered from the past 8 years. He awakened at 2:30 AM today with dry heaves. Pain is epigastrium and left upper quadrant. Nausea is under control and pain improved since treatment with intravenous opiates , fluids and antiemetics since arrival. Requesting more pain medicine. On exam alert nontoxic abdomen normal active bowel sounds nondistended tender at epigastrium and left upper quadrant. No guarding rigidity or rebound. Additional intravenous hydromorphone ordered.  8:04 AM pain improved but not under control. Additional hydromorphone ordered  8:35 AM pain is well-controlled patient is alert, appears comfortable. Able to drink water with no nausea or vomiting. Plan follow-up with pain management physician in 2 days, and with pmd regarding hyperglycemia Results for orders placed or performed during the hospital encounter of 01/01/15  Comprehensive metabolic panel  Result Value Ref Range   Sodium 138 135 - 145 mmol/L   Potassium 3.9 3.5 - 5.1 mmol/L   Chloride 104 101 - 111 mmol/L   CO2 25 22 - 32 mmol/L   Glucose, Bld 136 (H) 65 - 99 mg/dL   BUN 21 (H) 6 - 20 mg/dL   Creatinine, Ser 0.83 0.61 - 1.24 mg/dL   Calcium 9.3 8.9 - 10.3 mg/dL   Total Protein 7.3 6.5 - 8.1 g/dL   Albumin 4.2 3.5 - 5.0 g/dL   AST 32 15 - 41 U/L   ALT 30 17 - 63 U/L   Alkaline Phosphatase 53 38 - 126 U/L   Total Bilirubin 0.7 0.3 - 1.2 mg/dL   GFR calc non Af Amer >60 >60 mL/min   GFR calc Af Amer >60 >60 mL/min   Anion gap 9 5 - 15  Lipase, blood  Result Value Ref Range   Lipase 27 22 - 51 U/L  CBC with Differential  Result Value Ref Range   WBC 13.9 (H) 4.0 - 10.5 K/uL   RBC 5.44 4.22 - 5.81 MIL/uL   Hemoglobin 16.0 13.0 - 17.0 g/dL   HCT 45.0 39.0 - 52.0 %   MCV 82.7 78.0 - 100.0 fL   MCH 29.4 26.0 - 34.0 pg   MCHC 35.6 30.0 - 36.0 g/dL   RDW 12.7 11.5 - 15.5 %   Platelets 257 150 - 400 K/uL   Neutrophils Relative %  75 43 - 77 %   Neutro Abs 10.5 (H) 1.7 - 7.7 K/uL   Lymphocytes Relative 17 12 - 46 %   Lymphs Abs 2.3 0.7 - 4.0 K/uL   Monocytes Relative 7 3 - 12 %   Monocytes Absolute 0.9 0.1 - 1.0 K/uL   Eosinophils Relative 1 0 - 5 %   Eosinophils Absolute 0.1 0.0 - 0.7 K/uL   Basophils Relative 0 0 - 1 %   Basophils Absolute 0.1 0.0 - 0.1 K/uL   No results found.  Diagnosis #1 chronic abdominal pain #2 nausea and vomiting #3hyperglycemia  Orlie Dakin, MD 01/01/15 [phone]

## 2015-01-01 NOTE — ED Notes (Signed)
Family at bedside. 

## 2015-01-01 NOTE — ED Notes (Signed)
EDP aware of pts cont c/o pain, additional pain meds ordered by EDP

## 2015-01-01 NOTE — Discharge Instructions (Signed)
Call your pain management doctor in 2 days. Your medication may need to be adjusted. Your blood sugar today was 136 which is mildly elevated. You may be borderline diabetic. Ask your primary care physician to order a test known as hemoglobin A1c which will indicate what your blood sugar has been over recent weeks.

## 2015-01-01 NOTE — ED Notes (Signed)
C/o abd pain radiating into back onset 0230 this w nausea and vomiting x 5

## 2015-01-01 NOTE — ED Notes (Signed)
MD at bedside. 

## 2015-01-01 NOTE — ED Provider Notes (Signed)
CSN: [id number]     Arrival date & time 01/01/15  0610 History   First MD Initiated Contact with Patient 01/01/15 [phone]     Chief Complaint  Patient presents with  . Abdominal Pain     (Consider location/radiation/quality/duration/timing/severity/associated sxs/prior Treatment) HPI Comments: Patient is a 39 year old male with history of chronic pancreatitis in pain management at [facility]. He is on a fentanyl patch and oral breakthrough pain meds. His pain started at approximately 2:30 this morning and woke him from sleep. He reports feeling nauseated and having multiple episodes of emesis. He denies any fevers or chills. He denies any bloody stool or vomit. He reports this pain is similar to what he experiences with his flareups of pancreatitis. He comes to the emergency department approximately once monthly for flareups of this.  Patient is a 39 y.o. male presenting with abdominal pain. The history is provided by the patient.  Abdominal Pain Pain location:  Epigastric Pain quality: gnawing   Pain radiates to:  Does not radiate Pain severity:  Severe Onset quality:  Sudden Duration:  4 hours Timing:  Constant Progression:  Worsening Chronicity:  Recurrent Relieved by:  Nothing Worsened by:  Nothing tried Ineffective treatments:  None tried Associated symptoms: no chills and no fever     Past Medical History  Diagnosis Date  . Pancreatitis   . Kidney stones    Past Surgical History  Procedure Laterality Date  . Appendectomy    . Cholecystectomy    . Kidney stone surgery    . Ercp w/ metal stent placement     No family history on file. History  Substance Use Topics  . Smoking status: Current Every Day Smoker -- 0.50 packs/day    Types: Cigarettes  . Smokeless tobacco: Never Used  . Alcohol Use: Yes     Comment: rarely, pt stated that he drinks once ever 3 months and it is usually a beer at dinner    Review of Systems  Constitutional: Negative for fever and chills.   Gastrointestinal: Positive for abdominal pain.  All other systems reviewed and are negative.     Allergies  Cortisone; Eggs or egg-derived products; Ketorolac tromethamine; and Haldol  Home Medications   Prior to Admission medications   Medication Sig Start Date End Date Taking? Authorizing Provider  Cyanocobalamin (B-12 PO) Take 1 tablet by mouth daily.    Historical Provider, MD  diphenhydrAMINE (BENADRYL) 25 MG tablet Take 1 tablet (25 mg total) by mouth every 6 (six) hours. For the next 3 days 08/18/14   Linton Flemings, MD  Doxylamine-Phenylephrine-APAP [facility] NYQUIL SINUS) 6.25-5-325 MG CAPS Take 1 capsule by mouth daily as needed (for sinus).    Historical Provider, MD  famotidine (PEPCID) 20 MG tablet Take 1 tablet (20 mg total) by mouth 2 (two) times daily. Patient not taking: Reported on 08/18/2014 03/23/14   Fredia Sorrow, MD  fentaNYL (DURAGESIC - DOSED MCG/HR) 25 MCG/HR Place 50 mcg onto the skin every other day.     Historical Provider, MD  fentaNYL (DURAGESIC - DOSED MCG/HR) 50 MCG/HR Place 50 mcg onto the skin every 3 (three) days.    Historical Provider, MD  HYDROcodone-acetaminophen (NORCO/VICODIN) 5-325 MG per tablet Take 1-2 tablets by mouth every 6 (six) hours as needed for moderate pain. 03/23/14   Fredia Sorrow, MD  Melatonin 5 MG TABS Take 10 mg by mouth at bedtime as needed (for sleep).     Historical Provider, MD  methocarbamol (ROBAXIN-750) 750 MG tablet Take  1 tablet (750 mg total) by mouth every 6 (six) hours as needed for muscle spasms. 08/18/14   Linton Flemings, MD  oxyCODONE-acetaminophen (PERCOCET) 10-325 MG per tablet Take 1 tablet by mouth every 4 (four) hours as needed for pain.    Historical Provider, MD  promethazine (PHENERGAN) 25 MG tablet Take 1 tablet (25 mg total) by mouth every 6 (six) hours as needed for nausea or vomiting. 07/17/14   Shanon Rosser, MD  sucralfate (CARAFATE) 1 GM/10ML suspension Take 10 mLs (1 g total) by mouth 4 (four) times daily -  with  meals and at bedtime. Patient taking differently: Take 1 g by mouth 3 (three) times daily as needed (cronic pantiritious).  05/04/14   Quintella Reichert, MD  tapentadol (NUCYNTA) 50 MG TABS Take 100 mg by mouth every 6 (six) hours as needed for moderate pain.     Historical Provider, MD  Tapentadol HCl 75 MG TABS Take 75 mg by mouth every 4 (four) hours as needed (pain).    Historical Provider, MD   BP 139/94 mmHg  Pulse 111  Temp(Src) 98.2 F (36.8 C) (Oral)  Resp 16  Ht 6\' 1"  (1.854 m)  Wt 200 lb (90.719 kg)  BMI 26.39 kg/m2  SpO2 97% Physical Exam  Constitutional: He is oriented to person, place, and time. He appears well-developed and well-nourished. No distress.  HENT:  Head: Normocephalic and atraumatic.  Mouth/Throat: Oropharynx is clear and moist.  Neck: Normal range of motion. Neck supple.  Cardiovascular: Normal rate, regular rhythm and normal heart sounds.   No murmur heard. Pulmonary/Chest: Effort normal and breath sounds normal. No respiratory distress. He has no wheezes.  Abdominal: Soft. Bowel sounds are normal. He exhibits no distension. There is no tenderness.  Musculoskeletal: Normal range of motion. He exhibits no edema.  Neurological: He is alert and oriented to person, place, and time.  Skin: Skin is warm and dry. He is not diaphoretic.  Nursing note and vitals reviewed.   ED Course  Procedures (including critical care time) Labs Review Labs Reviewed  COMPREHENSIVE METABOLIC PANEL  LIPASE, BLOOD  CBC WITH DIFFERENTIAL/PLATELET    Imaging Review No results found.   EKG Interpretation None      MDM   Final diagnoses:  None    Will receive hydration, pain and nausea medication, and laboratory studies will be obtained. Care will be signed out to Dr. Winfred Leeds at shift change who will obtain the results of the laboratory studies and determine the final disposition.    Veryl Speak, MD 01/01/15 [phone]

## 2015-01-01 NOTE — ED Notes (Signed)
EDP aware, additional pain med ordered, verified order with EDP prior to adminstration

## 2015-01-01 NOTE — ED Notes (Signed)
Pt placed on cont POX monitoring and intermittent NBP.

## 2015-01-21 ENCOUNTER — Encounter (HOSPITAL_BASED_OUTPATIENT_CLINIC_OR_DEPARTMENT_OTHER): Payer: Self-pay | Admitting: *Deleted

## 2015-01-21 ENCOUNTER — Emergency Department (HOSPITAL_BASED_OUTPATIENT_CLINIC_OR_DEPARTMENT_OTHER)
Admission: EM | Admit: 2015-01-21 | Discharge: 2015-01-21 | Disposition: A | Discharge status: Home / Self Care | Payer: BC Managed Care – PPO | Attending: Emergency Medicine | Admitting: Emergency Medicine

## 2015-01-21 DIAGNOSIS — Z79899 Other long term (current) drug therapy: Secondary | ICD-10-CM | POA: Insufficient documentation

## 2015-01-21 DIAGNOSIS — Z9049 Acquired absence of other specified parts of digestive tract: Secondary | ICD-10-CM | POA: Insufficient documentation

## 2015-01-21 DIAGNOSIS — Z87442 Personal history of urinary calculi: Secondary | ICD-10-CM | POA: Insufficient documentation

## 2015-01-21 DIAGNOSIS — R000 Tachycardia, unspecified: Secondary | ICD-10-CM | POA: Insufficient documentation

## 2015-01-21 DIAGNOSIS — Z720 Tobacco use: Secondary | ICD-10-CM | POA: Insufficient documentation

## 2015-01-21 DIAGNOSIS — K861 Other chronic pancreatitis: Secondary | ICD-10-CM

## 2015-01-21 LAB — CBC
HEMATOCRIT: 40.5 % (ref 39.0–52.0)
Hemoglobin: 14.4 g/dL (ref 13.0–17.0)
MCH: 29.9 pg (ref 26.0–34.0)
MCHC: 35.6 g/dL (ref 30.0–36.0)
MCV: 84 fL (ref 78.0–100.0)
Platelets: 195 10*3/uL (ref 150–400)
RBC: 4.82 MIL/uL (ref 4.22–5.81)
RDW: 12.7 % (ref 11.5–15.5)
WBC: 9.9 10*3/uL (ref 4.0–10.5)

## 2015-01-21 LAB — URINALYSIS, ROUTINE W REFLEX MICROSCOPIC
Bilirubin Urine: NEGATIVE
GLUCOSE, UA: NEGATIVE mg/dL
HGB URINE DIPSTICK: NEGATIVE
Ketones, ur: NEGATIVE mg/dL
Leukocytes, UA: NEGATIVE
Nitrite: NEGATIVE
PROTEIN: NEGATIVE mg/dL
Specific Gravity, Urine: 1.019 (ref 1.005–1.030)
Urobilinogen, UA: 1 mg/dL (ref 0.0–1.0)
pH: 6.5 (ref 5.0–8.0)

## 2015-01-21 LAB — COMPREHENSIVE METABOLIC PANEL
ALBUMIN: 3.9 g/dL (ref 3.5–5.0)
ALT: 20 U/L (ref 17–63)
ANION GAP: 10 (ref 5–15)
AST: 23 U/L (ref 15–41)
Alkaline Phosphatase: 56 U/L (ref 38–126)
BILIRUBIN TOTAL: 0.4 mg/dL (ref 0.3–1.2)
BUN: 16 mg/dL (ref 6–20)
CO2: 27 mmol/L (ref 22–32)
Calcium: 9.1 mg/dL (ref 8.9–10.3)
Chloride: 103 mmol/L (ref 101–111)
Creatinine, Ser: 0.91 mg/dL (ref 0.61–1.24)
GFR calc Af Amer: >60 mL/min (ref >60)
GLUCOSE: 113 mg/dL — AB (ref 65–99)
POTASSIUM: 3.5 mmol/L (ref 3.5–5.1)
Sodium: 140 mmol/L (ref 135–145)
TOTAL PROTEIN: 6.5 g/dL (ref 6.5–8.1)

## 2015-01-21 LAB — LIPASE, BLOOD: LIPASE: 144 U/L — AB (ref 22–51)

## 2015-01-21 MED ORDER — SODIUM CHLORIDE 0.9 % IV BOLUS (SEPSIS)
1000.0000 mL | Freq: Once | INTRAVENOUS | Status: AC
Start: 2015-01-21 — End: 2015-01-21
  Administered 2015-01-21: 1000 mL via INTRAVENOUS

## 2015-01-21 MED ORDER — HYDROMORPHONE HCL 1 MG/ML IJ SOLN
INTRAMUSCULAR | Status: DC
Start: 2015-01-21 — End: 2015-01-21
  Filled 2015-01-21: qty 1

## 2015-01-21 MED ORDER — HYDROMORPHONE HCL 1 MG/ML IJ SOLN
1.0000 mg | Freq: Once | INTRAMUSCULAR | Status: AC
  Administered 2015-01-21: 1 mg via INTRAVENOUS
  Filled 2015-01-21: qty 1

## 2015-01-21 MED ORDER — ONDANSETRON HCL 4 MG/2ML IJ SOLN
4.0000 mg | Freq: Once | INTRAMUSCULAR | Status: AC
  Administered 2015-01-21: 4 mg via INTRAVENOUS
  Filled 2015-01-21: qty 2

## 2015-01-21 MED ORDER — PROMETHAZINE HCL 25 MG/ML IJ SOLN
25.0000 mg | Freq: Once | INTRAMUSCULAR | Status: AC
  Administered 2015-01-21: 25 mg via INTRAVENOUS
  Filled 2015-01-21: qty 1

## 2015-01-21 MED ORDER — FENTANYL CITRATE (PF) 100 MCG/2ML IJ SOLN
100.0000 ug | Freq: Once | INTRAMUSCULAR | Status: AC
  Administered 2015-01-21: 100 ug via INTRAVENOUS
  Filled 2015-01-21: qty 2

## 2015-01-21 MED ORDER — PROMETHAZINE HCL 25 MG PO TABS: 25.0000 mg | ORAL_TABLET | Freq: Four times a day (QID) | ORAL | Status: DC | PRN

## 2015-01-21 MED ORDER — HYDROMORPHONE HCL 1 MG/ML IJ SOLN
2.0000 mg | Freq: Once | INTRAMUSCULAR | Status: AC
  Administered 2015-01-21: 2 mg via INTRAVENOUS

## 2015-01-21 MED ORDER — HYDROMORPHONE HCL 1 MG/ML IJ SOLN
1.0000 mg | Freq: Once | INTRAMUSCULAR | Status: DC
  Filled 2015-01-21: qty 1

## 2015-01-21 NOTE — ED Notes (Signed)
Family at bedside. 

## 2015-01-21 NOTE — Discharge Instructions (Signed)
Take Phenergan as directed as needed for nausea. Follow-up with your gastroenterologist and chronic pain management doctor.  Low-Fat Diet for Pancreatitis or Gallbladder Conditions A low-fat diet can be helpful if you have pancreatitis or a gallbladder condition. With these conditions, your pancreas and gallbladder have trouble digesting fats. A healthy eating plan with less fat will help rest your pancreas and gallbladder and reduce your symptoms. WHAT DO I NEED TO KNOW ABOUT THIS DIET?  Eat a low-fat diet.  Reduce your fat intake to less than 20-30% of your total daily calories. This is less than 50-60 g of fat per day.  Remember that you need some fat in your diet. Ask your dietician what your daily goal should be.  Choose nonfat and low-fat healthy foods. Look for the words "nonfat," "low fat," or "fat free."  As a guide, look on the label and choose foods with less than 3 g of fat per serving. Eat only one serving.  Avoid alcohol.  Do not smoke. If you need help quitting, talk with your health care provider.  Eat small frequent meals instead of three large heavy meals. WHAT FOODS CAN I EAT? Grains Include healthy grains and starches such as potatoes, wheat bread, fiber-rich cereal, and brown rice. Choose whole grain options whenever possible. In adults, whole grains should account for 45-65% of your daily calories.  Fruits and Vegetables Eat plenty of fruits and vegetables. Fresh fruits and vegetables add fiber to your diet. Meats and Other Protein Sources Eat lean meat such as chicken and pork. Trim any fat off of meat before cooking it. Eggs, fish, and beans are other sources of protein. In adults, these foods should account for 10-35% of your daily calories. Dairy Choose low-fat milk and dairy options. Dairy includes fat and protein, as well as calcium.  Fats and Oils Limit high-fat foods such as fried foods, sweets, baked goods, sugary drinks.  Other Creamy sauces and  condiments, such as mayonnaise, can add extra fat. Think about whether or not you need to use them, or use smaller amounts or low fat options. WHAT FOODS ARE NOT RECOMMENDED?  High fat foods, such as:  [employer].  Ice cream.  [place] toast.  Sweet rolls.  Pizza.  Cheese bread.  Foods covered with batter, butter, creamy sauces, or cheese.  Fried foods.  Sugary drinks and desserts.  Foods that cause gas or bloating Document Released: 05/19/2013 Document Reviewed: 05/19/2013 [facility] Patient Information 2015 [place], [place]. This information is not intended to replace advice given to you by your health care provider. Make sure you discuss any questions you have with your health care provider. Acute Pancreatitis Acute pancreatitis is a disease in which the pancreas becomes suddenly inflamed. The pancreas is a large gland located behind your stomach. The pancreas produces enzymes that help digest food. The pancreas also releases the hormones glucagon and insulin that help regulate blood sugar. Damage to the pancreas occurs when the digestive enzymes from the pancreas are activated and begin attacking the pancreas before being released into the intestine. Most acute attacks last a couple of days and can cause serious complications. Some people become dehydrated and develop low blood pressure. In severe cases, bleeding into the pancreas can lead to shock and can be life-threatening. The lungs, heart, and kidneys may fail. CAUSES  Pancreatitis can happen to anyone. In some cases, the cause is unknown. Most cases are caused by:  Alcohol abuse.  Gallstones. Other less common causes are:  Certain  medicines.  Exposure to certain chemicals.  Infection.  Damage caused by an accident (trauma).  Abdominal surgery. SYMPTOMS   Pain in the upper abdomen that may radiate to the back.  Tenderness and swelling of the abdomen.  Nausea and vomiting. DIAGNOSIS  Your caregiver will  perform a physical exam. Blood and stool tests may be done to confirm the diagnosis. Imaging tests may also be done, such as X-rays, CT scans, or an ultrasound of the abdomen. TREATMENT  Treatment usually requires a stay in the hospital. Treatment may include:  Pain medicine.  Fluid replacement through an intravenous line (IV).  Placing a tube in the stomach to remove stomach contents and control vomiting.  Not eating for 3 or 4 days. This gives your pancreas a rest, because enzymes are not being produced that can cause further damage.  Antibiotic medicines if your condition is caused by an infection.  Surgery of the pancreas or gallbladder. HOME CARE INSTRUCTIONS   Follow the diet advised by your caregiver. This may involve avoiding alcohol and decreasing the amount of fat in your diet.  Eat smaller, more frequent meals. This reduces the amount of digestive juices the pancreas produces.  Drink enough fluids to keep your urine clear or pale yellow.  Only take over-the-counter or prescription medicines as directed by your caregiver.  Avoid drinking alcohol if it caused your condition.  Do not smoke.  Get plenty of rest.  Check your blood sugar at home as directed by your caregiver.  Keep all follow-up appointments as directed by your caregiver. SEEK MEDICAL CARE IF:   You do not recover as quickly as expected.  You develop new or worsening symptoms.  You have persistent pain, weakness, or nausea.  You recover and then have another episode of pain. SEEK IMMEDIATE MEDICAL CARE IF:   You are unable to eat or keep fluids down.  Your pain becomes severe.  You have a fever or persistent symptoms for more than 2 to 3 days.  You have a fever and your symptoms suddenly get worse.  Your skin or the white part of your eyes turn yellow (jaundice).  You develop vomiting.  You feel dizzy, or you faint.  Your blood sugar is high (over 300 mg/dL). MAKE SURE YOU:    Understand these instructions.  Will watch your condition.  Will get help right away if you are not doing well or get worse. Document Released: 05/14/2005 Document Revised: 11/13/2011 Document Reviewed: 08/23/2011 [facility] Patient Information 2015 [place], [place]. This information is not intended to replace advice given to you by your health care provider. Make sure you discuss any questions you have with your health care provider.

## 2015-01-21 NOTE — ED Notes (Signed)
Left flank pain since yesterday. Vomiting.  Hx of pancreatitis.

## 2015-01-21 NOTE — ED Provider Notes (Signed)
CSN: [id number]     Arrival date & time 01/21/15  1408 History   First MD Initiated Contact with Patient 01/21/15 1416     Chief Complaint  Patient presents with  . Abdominal Pain  . Emesis     (Consider location/radiation/quality/duration/timing/severity/associated sxs/prior Treatment) HPI Comments: 39 year old male with a past medical history of chronic pancreatitis and kidney stones presenting with gradual onset epigastric and left upper quadrant abdominal pain beginning yesterday. Pain gradually worsening, becoming constant, described as sharp, occasionally radiating towards his back rated 8/10. This feels the same as his pancreatitis. Followed by gastroenterology at [facility] along with pain management. Has a fentanyl patch and oxycodone which he takes for chronic pain which are currently not helping. Admits to associated nausea with multiple episodes of nonbloody, nonbilious emesis and is unable to keep anything down. Denies diarrhea. Having normal bowel movements, last bowel movement was earlier today. Denies fever or chills. Denies chest pain or shortness of breath. Denies ETOH intake.  The history is provided by the patient.    Past Medical History  Diagnosis Date  . Pancreatitis   . Kidney stones    Past Surgical History  Procedure Laterality Date  . Appendectomy    . Cholecystectomy    . Kidney stone surgery    . Ercp w/ metal stent placement     No family history on file. Social History  Substance Use Topics  . Smoking status: Current Every Day Smoker -- 0.50 packs/day    Types: Cigarettes  . Smokeless tobacco: Never Used  . Alcohol Use: Yes     Comment: rarely, pt stated that he drinks once ever 3 months and it is usually a beer at dinner    Review of Systems  Gastrointestinal: Positive for nausea, vomiting and abdominal pain.  All other systems reviewed and are negative.     Allergies  Cortisone; Eggs or egg-derived products; Ketorolac tromethamine;  and Haldol  Home Medications   Prior to Admission medications   Medication Sig Start Date End Date Taking? Authorizing Provider  Cyanocobalamin (B-12 PO) Take 1 tablet by mouth daily.    Historical Provider, MD  diphenhydrAMINE (BENADRYL) 25 MG tablet Take 1 tablet (25 mg total) by mouth every 6 (six) hours. For the next 3 days 08/18/14   Linton Flemings, MD  Doxylamine-Phenylephrine-APAP [facility] NYQUIL SINUS) 6.25-5-325 MG CAPS Take 1 capsule by mouth daily as needed (for sinus).    Historical Provider, MD  famotidine (PEPCID) 20 MG tablet Take 1 tablet (20 mg total) by mouth 2 (two) times daily. Patient not taking: Reported on 08/18/2014 03/23/14   Fredia Sorrow, MD  fentaNYL (DURAGESIC - DOSED MCG/HR) 25 MCG/HR Place 50 mcg onto the skin every other day.     Historical Provider, MD  fentaNYL (DURAGESIC - DOSED MCG/HR) 50 MCG/HR Place 50 mcg onto the skin every 3 (three) days.    Historical Provider, MD  HYDROcodone-acetaminophen (NORCO/VICODIN) 5-325 MG per tablet Take 1-2 tablets by mouth every 6 (six) hours as needed for moderate pain. 03/23/14   Fredia Sorrow, MD  Melatonin 5 MG TABS Take 10 mg by mouth at bedtime as needed (for sleep).     Historical Provider, MD  methocarbamol (ROBAXIN-750) 750 MG tablet Take 1 tablet (750 mg total) by mouth every 6 (six) hours as needed for muscle spasms. 08/18/14   Linton Flemings, MD  oxyCODONE-acetaminophen (PERCOCET) 10-325 MG per tablet Take 1 tablet by mouth every 4 (four) hours as needed for pain.  Historical Provider, MD  promethazine (PHENERGAN) 25 MG tablet Take 1 tablet (25 mg total) by mouth every 6 (six) hours as needed for nausea or vomiting. 01/21/15   Carman Ching, PA-C  sucralfate (CARAFATE) 1 GM/10ML suspension Take 10 mLs (1 g total) by mouth 4 (four) times daily -  with meals and at bedtime. Patient taking differently: Take 1 g by mouth 3 (three) times daily as needed (cronic pantiritious).  05/04/14   Quintella Reichert, MD  tapentadol (NUCYNTA)  50 MG TABS Take 100 mg by mouth every 6 (six) hours as needed for moderate pain.     Historical Provider, MD  Tapentadol HCl 75 MG TABS Take 75 mg by mouth every 4 (four) hours as needed (pain).    Historical Provider, MD   BP 129/86 mmHg  Pulse 78  Temp(Src) 98.9 F (37.2 C) (Oral)  Resp 16  Ht 6\' 1"  (1.854 m)  Wt 205 lb (92.987 kg)  BMI 27.05 kg/m2  SpO2 94% Physical Exam  Constitutional: He is oriented to person, place, and time. He appears well-developed and well-nourished. No distress.  HENT:  Head: Normocephalic and atraumatic.  Eyes: Conjunctivae and EOM are normal.  Neck: Normal range of motion. Neck supple.  Cardiovascular: Regular rhythm and normal heart sounds.   Tachy.  Pulmonary/Chest: Effort normal and breath sounds normal.  Abdominal: Soft. Normal appearance and bowel sounds are normal. He exhibits no distension. There is tenderness in the epigastric area and left upper quadrant. There is guarding. There is no CVA tenderness.  No peritoneal signs.  Musculoskeletal: Normal range of motion. He exhibits no edema.  Neurological: He is alert and oriented to person, place, and time.  Skin: Skin is warm and dry.  Psychiatric: He has a normal mood and affect. His behavior is normal.  Nursing note and vitals reviewed.   ED Course  Procedures (including critical care time) Labs Review Labs Reviewed  COMPREHENSIVE METABOLIC PANEL - Abnormal; Notable for the following:    Glucose, Bld 113 (*)    All other components within normal limits  LIPASE, BLOOD - Abnormal; Notable for the following:    Lipase 144 (*)    All other components within normal limits  URINALYSIS, ROUTINE W REFLEX MICROSCOPIC (NOT AT [facility])  CBC    Imaging Review No results found. I have personally reviewed and evaluated these images and lab results as part of my medical decision-making.   EKG Interpretation None      MDM   Final diagnoses:  Other chronic pancreatitis   Nontoxic appearing,  NAD. Afebrile. Tachycardic, vitals otherwise stable. History of chronic pancreatitis. Labs significant with acute on chronic pancreatitis. Discussed admission with patient if unable to control pain and nausea. Patient would prefer to not be admitted in hopes that his pain can be controlled. Pain controlled after receiving both Dilaudid and fentanyl, nausea controlled with Zofran and Phenergan. He is able to tolerate drinking ginger ale without return of pain and nausea. Abdomen is still tender in epigastrium and left upper quadrant, however significant improvement from initial exam noted. Patient stable for discharge. Advised him to follow-up with his PCP and gastroenterologist along with pain management doctor. Return precautions given. Patient states understanding of treatment care plan and is agreeable.   Carman Ching, PA-C 01/21/15 2122  Tanna Furry, MD 01/23/15 [phone]

## 2015-03-06 ENCOUNTER — Emergency Department (HOSPITAL_BASED_OUTPATIENT_CLINIC_OR_DEPARTMENT_OTHER)
Admission: EM | Admit: 2015-03-06 | Discharge: 2015-03-06 | Disposition: A | Discharge status: Home / Self Care | Payer: 59 | Attending: Emergency Medicine | Admitting: Emergency Medicine

## 2015-03-06 ENCOUNTER — Encounter (HOSPITAL_BASED_OUTPATIENT_CLINIC_OR_DEPARTMENT_OTHER): Payer: Self-pay | Admitting: *Deleted

## 2015-03-06 DIAGNOSIS — Z720 Tobacco use: Secondary | ICD-10-CM | POA: Insufficient documentation

## 2015-03-06 DIAGNOSIS — Z9049 Acquired absence of other specified parts of digestive tract: Secondary | ICD-10-CM | POA: Insufficient documentation

## 2015-03-06 DIAGNOSIS — Z8719 Personal history of other diseases of the digestive system: Secondary | ICD-10-CM | POA: Diagnosis not present

## 2015-03-06 DIAGNOSIS — Z9089 Acquired absence of other organs: Secondary | ICD-10-CM | POA: Diagnosis not present

## 2015-03-06 DIAGNOSIS — R1013 Epigastric pain: Secondary | ICD-10-CM

## 2015-03-06 DIAGNOSIS — Z79899 Other long term (current) drug therapy: Secondary | ICD-10-CM | POA: Insufficient documentation

## 2015-03-06 DIAGNOSIS — Z87442 Personal history of urinary calculi: Secondary | ICD-10-CM | POA: Insufficient documentation

## 2015-03-06 LAB — CBC WITH DIFFERENTIAL/PLATELET
Basophils Absolute: 0.1 10*3/uL (ref 0.0–0.1)
Basophils Relative: 1 %
EOS ABS: 0.3 10*3/uL (ref 0.0–0.7)
EOS PCT: 3 %
HCT: 39.7 % (ref 39.0–52.0)
HEMOGLOBIN: 13.8 g/dL (ref 13.0–17.0)
LYMPHS ABS: 3.9 10*3/uL (ref 0.7–4.0)
Lymphocytes Relative: 38 %
MCH: 29.4 pg (ref 26.0–34.0)
MCHC: 34.8 g/dL (ref 30.0–36.0)
MCV: 84.6 fL (ref 78.0–100.0)
MONOS PCT: 11 %
Monocytes Absolute: 1.1 10*3/uL — ABNORMAL HIGH (ref 0.1–1.0)
NEUTROS PCT: 47 %
Neutro Abs: 4.8 10*3/uL (ref 1.7–7.7)
PLATELETS: 222 10*3/uL (ref 150–400)
RBC: 4.69 MIL/uL (ref 4.22–5.81)
RDW: 13 % (ref 11.5–15.5)
WBC: 10.2 10*3/uL (ref 4.0–10.5)

## 2015-03-06 LAB — COMPREHENSIVE METABOLIC PANEL
ALBUMIN: 3.9 g/dL (ref 3.5–5.0)
ALK PHOS: 61 U/L (ref 38–126)
ALT: 19 U/L (ref 17–63)
ANION GAP: 6 (ref 5–15)
AST: 24 U/L (ref 15–41)
BILIRUBIN TOTAL: 0.4 mg/dL (ref 0.3–1.2)
BUN: 12 mg/dL (ref 6–20)
CALCIUM: 8.7 mg/dL — AB (ref 8.9–10.3)
CO2: 25 mmol/L (ref 22–32)
CREATININE: 0.82 mg/dL (ref 0.61–1.24)
Chloride: 108 mmol/L (ref 101–111)
GFR calc non Af Amer: >60 mL/min (ref >60)
GLUCOSE: 116 mg/dL — AB (ref 65–99)
Potassium: 3.5 mmol/L (ref 3.5–5.1)
Sodium: 139 mmol/L (ref 135–145)
TOTAL PROTEIN: 6.6 g/dL (ref 6.5–8.1)

## 2015-03-06 LAB — LIPASE, BLOOD: LIPASE: 39 U/L (ref 22–51)

## 2015-03-06 LAB — TROPONIN I: Troponin I: <0.03 ng/mL (ref <0.031)

## 2015-03-06 MED ORDER — SODIUM CHLORIDE 0.9 % IV BOLUS (SEPSIS)
1000.0000 mL | Freq: Once | INTRAVENOUS | Status: AC
  Administered 2015-03-06: 1000 mL via INTRAVENOUS

## 2015-03-06 MED ORDER — HYDROMORPHONE HCL 1 MG/ML IJ SOLN
1.0000 mg | Freq: Once | INTRAMUSCULAR | Status: AC
  Administered 2015-03-06: 1 mg via INTRAVENOUS
  Filled 2015-03-06: qty 1

## 2015-03-06 MED ORDER — ONDANSETRON HCL 4 MG/2ML IJ SOLN
4.0000 mg | Freq: Once | INTRAMUSCULAR | Status: AC
  Administered 2015-03-06: 4 mg via INTRAVENOUS
  Filled 2015-03-06: qty 2

## 2015-03-06 NOTE — ED Provider Notes (Signed)
CSN: [id number]     Arrival date & time 03/06/15  0353 History   First MD Initiated Contact with Patient 03/06/15 0409     Chief Complaint  Patient presents with  . Abdominal Pain     (Consider location/radiation/quality/duration/timing/severity/associated sxs/prior Treatment) HPI Comments: Patient is a 39 year old male with history of chronic pancreatitis. He presents for evaluation of severe epigastric discomfort that started approximately 2 hours prior to arrival. He reports nausea and vomiting denies any diarrhea or constipation. He denies any fevers or chills.  This patient has been seen here multiple times for similar complaints over the past several years. He is also followed by a gastroenterologist and chronic pain specialist at [facility].  Patient is a 39 y.o. male presenting with abdominal pain. The history is provided by the patient.  Abdominal Pain Pain location:  Epigastric Pain quality: cramping   Pain radiates to:  Does not radiate Pain severity:  Severe Onset quality:  Sudden Duration:  2 hours Timing:  Constant Progression:  Worsening Chronicity:  Chronic Relieved by:  Nothing Worsened by:  Nothing tried Ineffective treatments:  None tried   Past Medical History  Diagnosis Date  . Pancreatitis   . Kidney stones    Past Surgical History  Procedure Laterality Date  . Appendectomy    . Cholecystectomy    . Kidney stone surgery    . Ercp w/ metal stent placement     No family history on file. Social History  Substance Use Topics  . Smoking status: Current Every Day Smoker -- 0.50 packs/day    Types: Cigarettes  . Smokeless tobacco: Never Used  . Alcohol Use: Yes     Comment: rarely, pt stated that he drinks once ever 3 months and it is usually a beer at dinner    Review of Systems  Gastrointestinal: Positive for abdominal pain.  All other systems reviewed and are negative.     Allergies  Cortisone; Eggs or egg-derived products; Ketorolac  tromethamine; and Haldol  Home Medications   Prior to Admission medications   Medication Sig Start Date End Date Taking? Authorizing Provider  Cyanocobalamin (B-12 PO) Take 1 tablet by mouth daily.    Historical Provider, MD  diphenhydrAMINE (BENADRYL) 25 MG tablet Take 1 tablet (25 mg total) by mouth every 6 (six) hours. For the next 3 days 08/18/14   Linton Flemings, MD  Doxylamine-Phenylephrine-APAP [facility] NYQUIL SINUS) 6.25-5-325 MG CAPS Take 1 capsule by mouth daily as needed (for sinus).    Historical Provider, MD  famotidine (PEPCID) 20 MG tablet Take 1 tablet (20 mg total) by mouth 2 (two) times daily. Patient not taking: Reported on 08/18/2014 03/23/14   Fredia Sorrow, MD  fentaNYL (DURAGESIC - DOSED MCG/HR) 25 MCG/HR Place 50 mcg onto the skin every other day.     Historical Provider, MD  fentaNYL (DURAGESIC - DOSED MCG/HR) 50 MCG/HR Place 50 mcg onto the skin every 3 (three) days.    Historical Provider, MD  HYDROcodone-acetaminophen (NORCO/VICODIN) 5-325 MG per tablet Take 1-2 tablets by mouth every 6 (six) hours as needed for moderate pain. 03/23/14   Fredia Sorrow, MD  Melatonin 5 MG TABS Take 10 mg by mouth at bedtime as needed (for sleep).     Historical Provider, MD  methocarbamol (ROBAXIN-750) 750 MG tablet Take 1 tablet (750 mg total) by mouth every 6 (six) hours as needed for muscle spasms. 08/18/14   Linton Flemings, MD  oxyCODONE-acetaminophen (PERCOCET) 10-325 MG per tablet Take 1 tablet by  mouth every 4 (four) hours as needed for pain.    Historical Provider, MD  promethazine (PHENERGAN) 25 MG tablet Take 1 tablet (25 mg total) by mouth every 6 (six) hours as needed for nausea or vomiting. 01/21/15   Carman Ching, PA-C  sucralfate (CARAFATE) 1 GM/10ML suspension Take 10 mLs (1 g total) by mouth 4 (four) times daily -  with meals and at bedtime. Patient taking differently: Take 1 g by mouth 3 (three) times daily as needed (cronic pantiritious).  05/04/14   Quintella Reichert, MD   tapentadol (NUCYNTA) 50 MG TABS Take 100 mg by mouth every 6 (six) hours as needed for moderate pain.     Historical Provider, MD  Tapentadol HCl 75 MG TABS Take 75 mg by mouth every 4 (four) hours as needed (pain).    Historical Provider, MD   BP 133/90 mmHg  Pulse 92  Temp(Src) 99.2 F (37.3 C) (Oral)  Resp 16  Ht 6\' 1"  (1.854 m)  Wt 205 lb (92.987 kg)  BMI 27.05 kg/m2  SpO2 94% Physical Exam  Constitutional: He is oriented to person, place, and time. He appears well-developed and well-nourished. No distress.  HENT:  Head: Normocephalic and atraumatic.  Neck: Normal range of motion. Neck supple.  Cardiovascular: Normal rate, regular rhythm and normal heart sounds.   No murmur heard. Pulmonary/Chest: Effort normal and breath sounds normal. No respiratory distress. He has no wheezes.  Abdominal: Soft. Bowel sounds are normal. He exhibits no distension. There is tenderness. There is no rebound and no guarding.  There is tenderness to palpation in the epigastric region.  Musculoskeletal: Normal range of motion.  Neurological: He is alert and oriented to person, place, and time.  Skin: Skin is warm. He is not diaphoretic.  Nursing note and vitals reviewed.   ED Course  Procedures (including critical care time) Labs Review Labs Reviewed  CBC WITH DIFFERENTIAL/PLATELET - Abnormal; Notable for the following:    Monocytes Absolute 1.1 (*)    All other components within normal limits  COMPREHENSIVE METABOLIC PANEL  TROPONIN I    Imaging Review No results found. I have personally reviewed and evaluated these images and lab results as part of my medical decision-making.   EKG Interpretation None      MDM   Final diagnoses:  None    Patient's pain is markedly improved after 2 doses of Dilaudid and IV fluids. His laboratory studies are reassuring. He has no elevation of lipase or LFTs and his white count is normal. He will be discharged to home with when necessary  follow-up with his gastroenterologist.    Veryl Speak, MD 03/06/15 [phone]

## 2015-03-06 NOTE — Discharge Instructions (Signed)
Continue your medications as previously prescribed.  Return to the ER symptoms significantly worsen or change.   Abdominal Pain, Adult Many things can cause abdominal pain. Usually, abdominal pain is not caused by a disease and will improve without treatment. It can often be observed and treated at home. Your health care provider will do a physical exam and possibly order blood tests and X-rays to help determine the seriousness of your pain. However, in many cases, more time must pass before a clear cause of the pain can be found. Before that point, your health care provider may not know if you need more testing or further treatment. HOME CARE INSTRUCTIONS Monitor your abdominal pain for any changes. The following actions may help to alleviate any discomfort you are experiencing:  Only take over-the-counter or prescription medicines as directed by your health care provider.  Do not take laxatives unless directed to do so by your health care provider.  Try a clear liquid diet (broth, tea, or water) as directed by your health care provider. Slowly move to a bland diet as tolerated. SEEK MEDICAL CARE IF:  You have unexplained abdominal pain.  You have abdominal pain associated with nausea or diarrhea.  You have pain when you urinate or have a bowel movement.  You experience abdominal pain that wakes you in the night.  You have abdominal pain that is worsened or improved by eating food.  You have abdominal pain that is worsened with eating fatty foods.  You have a fever. SEEK IMMEDIATE MEDICAL CARE IF:  Your pain does not go away within 2 hours.  You keep throwing up (vomiting).  Your pain is felt only in portions of the abdomen, such as the right side or the left lower portion of the abdomen.  You pass bloody or black tarry stools. MAKE SURE YOU:  Understand these instructions.  Will watch your condition.  Will get help right away if you are not doing well or get worse.     This information is not intended to replace advice given to you by your health care provider. Make sure you discuss any questions you have with your health care provider.   Document Released: 02/21/2005 Document Revised: 02/02/2015 Document Reviewed: 01/21/2013 Elsevier Interactive Patient Education [employer].

## 2015-03-06 NOTE — ED Notes (Signed)
Denies needs or questions, steady gait, out with wife, "feel a little better". Dr. Stark Jock in to see pt at time of d/c.

## 2015-03-06 NOTE — ED Notes (Signed)
Dr. Stark Jock in to see pt, at [facility]. Wife at [facility]. VSS. Pt remains alert, NAD, calm, interactive.

## 2015-03-06 NOTE — ED Notes (Signed)
C/o abd pain, r/t "pancreatitis, h/o same", onset 1.5 hrs ago, also nv and weakness, no meds PTA, (denies: diarrhea, fever, sob, dizziness, bleeding or other sx), last ate ~2030, vomited x2 in last 24 hrs. Denies ETOH.

## 2015-03-31 ENCOUNTER — Emergency Department (HOSPITAL_COMMUNITY): Payer: 59

## 2015-03-31 ENCOUNTER — Encounter (HOSPITAL_COMMUNITY): Payer: Self-pay | Admitting: Emergency Medicine

## 2015-03-31 ENCOUNTER — Emergency Department (HOSPITAL_COMMUNITY)
Admission: EM | Admit: 2015-03-31 | Discharge: 2015-03-31 | Disposition: A | Discharge status: Home / Self Care | Payer: 59 | Attending: Emergency Medicine | Admitting: Emergency Medicine

## 2015-03-31 DIAGNOSIS — R112 Nausea with vomiting, unspecified: Secondary | ICD-10-CM | POA: Diagnosis not present

## 2015-03-31 DIAGNOSIS — R000 Tachycardia, unspecified: Secondary | ICD-10-CM | POA: Insufficient documentation

## 2015-03-31 DIAGNOSIS — Z79891 Long term (current) use of opiate analgesic: Secondary | ICD-10-CM | POA: Diagnosis not present

## 2015-03-31 DIAGNOSIS — R1013 Epigastric pain: Secondary | ICD-10-CM | POA: Diagnosis not present

## 2015-03-31 DIAGNOSIS — Z720 Tobacco use: Secondary | ICD-10-CM | POA: Diagnosis not present

## 2015-03-31 DIAGNOSIS — Z79899 Other long term (current) drug therapy: Secondary | ICD-10-CM | POA: Diagnosis not present

## 2015-03-31 DIAGNOSIS — Z8719 Personal history of other diseases of the digestive system: Secondary | ICD-10-CM | POA: Insufficient documentation

## 2015-03-31 DIAGNOSIS — Z87442 Personal history of urinary calculi: Secondary | ICD-10-CM | POA: Diagnosis not present

## 2015-03-31 DIAGNOSIS — R1012 Left upper quadrant pain: Secondary | ICD-10-CM | POA: Diagnosis not present

## 2015-03-31 DIAGNOSIS — R109 Unspecified abdominal pain: Secondary | ICD-10-CM

## 2015-03-31 LAB — URINALYSIS, ROUTINE W REFLEX MICROSCOPIC
BILIRUBIN URINE: NEGATIVE
GLUCOSE, UA: NEGATIVE mg/dL
HGB URINE DIPSTICK: NEGATIVE
Ketones, ur: NEGATIVE mg/dL
Leukocytes, UA: NEGATIVE
Nitrite: NEGATIVE
PROTEIN: NEGATIVE mg/dL
SPECIFIC GRAVITY, URINE: 1.016 (ref 1.005–1.030)
UROBILINOGEN UA: 0.2 mg/dL (ref 0.0–1.0)
pH: 6 (ref 5.0–8.0)

## 2015-03-31 LAB — CBC
HEMATOCRIT: 42.4 % (ref 39.0–52.0)
Hemoglobin: 15.3 g/dL (ref 13.0–17.0)
MCH: 29.9 pg (ref 26.0–34.0)
MCHC: 36.1 g/dL — AB (ref 30.0–36.0)
MCV: 82.8 fL (ref 78.0–100.0)
PLATELETS: 198 10*3/uL (ref 150–400)
RBC: 5.12 MIL/uL (ref 4.22–5.81)
RDW: 13.4 % (ref 11.5–15.5)
WBC: 10.6 10*3/uL — AB (ref 4.0–10.5)

## 2015-03-31 LAB — COMPREHENSIVE METABOLIC PANEL
ALT: 21 U/L (ref 17–63)
ANION GAP: 9 (ref 5–15)
AST: 27 U/L (ref 15–41)
Albumin: 4.3 g/dL (ref 3.5–5.0)
Alkaline Phosphatase: 64 U/L (ref 38–126)
BILIRUBIN TOTAL: 0.4 mg/dL (ref 0.3–1.2)
BUN: 14 mg/dL (ref 6–20)
CHLORIDE: 107 mmol/L (ref 101–111)
CO2: 23 mmol/L (ref 22–32)
Calcium: 9.6 mg/dL (ref 8.9–10.3)
Creatinine, Ser: 0.81 mg/dL (ref 0.61–1.24)
GFR calc Af Amer: >60 mL/min (ref >60)
Glucose, Bld: 110 mg/dL — ABNORMAL HIGH (ref 65–99)
POTASSIUM: 3.5 mmol/L (ref 3.5–5.1)
Sodium: 139 mmol/L (ref 135–145)
Total Protein: 6.9 g/dL (ref 6.5–8.1)

## 2015-03-31 LAB — LIPASE, BLOOD: Lipase: 26 U/L (ref 11–51)

## 2015-03-31 MED ORDER — HYDROMORPHONE HCL 1 MG/ML IJ SOLN
1.0000 mg | Freq: Once | INTRAMUSCULAR | Status: AC
  Administered 2015-03-31: 1 mg via INTRAVENOUS
  Filled 2015-03-31: qty 1

## 2015-03-31 MED ORDER — SODIUM CHLORIDE 0.9 % IV BOLUS (SEPSIS)
1000.0000 mL | Freq: Once | INTRAVENOUS | Status: AC
  Administered 2015-03-31: 1000 mL via INTRAVENOUS

## 2015-03-31 MED ORDER — ONDANSETRON HCL 4 MG/2ML IJ SOLN
4.0000 mg | Freq: Once | INTRAMUSCULAR | Status: AC
  Administered 2015-03-31: 4 mg via INTRAVENOUS
  Filled 2015-03-31: qty 2

## 2015-03-31 NOTE — ED Notes (Signed)
Pt w/ LUQ pain x 30 mins.  Has vomited twice.  Hx of pancreatitis.

## 2015-03-31 NOTE — ED Notes (Signed)
Patient is able to void on his own

## 2015-03-31 NOTE — ED Provider Notes (Signed)
CSN: [id number]     Arrival date & time 03/31/15  1735 History   First MD Initiated Contact with Patient 03/31/15 1821     Chief Complaint  Patient presents with  . Abdominal Pain  . Nausea  . Emesis     (Consider location/radiation/quality/duration/timing/severity/associated sxs/prior Treatment) HPI [name] is a 39 y.o. male with hx of kidney stones and pancreatitis, presents to ED with complaint of abdominal pain. Pain started approximately 45 min prior to arrival to ED. States multiple episodes of vomiting. Pain in left upper quadrant. Does not radiate. No blood in stool or emesis. No diarrhea. Denies fever or chill. Pain similar to prior pancreatitis. Denies alcohol. Hx of multiple abdominal surgeries.   Past Medical History  Diagnosis Date  . Pancreatitis   . Kidney stones    Past Surgical History  Procedure Laterality Date  . Appendectomy    . Cholecystectomy    . Kidney stone surgery    . Ercp w/ metal stent placement     No family history on file. Social History  Substance Use Topics  . Smoking status: Current Every Day Smoker -- 0.50 packs/day    Types: Cigarettes  . Smokeless tobacco: Never Used  . Alcohol Use: Yes     Comment: rarely, pt stated that he drinks once ever 3 months and it is usually a beer at dinner    Review of Systems  Constitutional: Negative for fever and chills.  Respiratory: Negative for cough, chest tightness and shortness of breath.   Cardiovascular: Negative for chest pain, palpitations and leg swelling.  Gastrointestinal: Positive for nausea, vomiting and abdominal pain. Negative for diarrhea and abdominal distention.  Musculoskeletal: Negative for myalgias, arthralgias, neck pain and neck stiffness.  Skin: Negative for rash.  Allergic/Immunologic: Negative for immunocompromised state.  Neurological: Negative for dizziness, weakness, light-headedness, numbness and headaches.  All other systems reviewed and are  negative.     Allergies  Cortisone; Eggs or egg-derived products; Iodinated diagnostic agents; Ketorolac tromethamine; and Haldol  Home Medications   Prior to Admission medications   Medication Sig Start Date End Date Taking? Authorizing Provider  clonazePAM (KLONOPIN) 0.5 MG tablet Take 0.5 mg by mouth daily. 12/16/14  Yes Historical Provider, MD  Cyanocobalamin (B-12 PO) Take 1 tablet by mouth daily.   Yes Historical Provider, MD  Doxylamine-Phenylephrine-APAP (VICKS NYQUIL SINUS) 6.25-5-325 MG CAPS Take 1 capsule by mouth daily as needed (for sinus).   Yes Historical Provider, MD  fentaNYL (DURAGESIC - DOSED MCG/HR) 50 MCG/HR Place 50 mcg onto the skin every 3 (three) days.   Yes Historical Provider, MD  loratadine (CLARITIN) 10 MG tablet Take 10 mg by mouth daily as needed for allergies.   Yes Historical Provider, MD  Melatonin 5 MG TABS Take 10 mg by mouth at bedtime as needed (for sleep).    Yes Historical Provider, MD  Multiple Vitamin (MULTIVITAMIN WITH MINERALS) TABS tablet Take 1 tablet by mouth daily.   Yes Historical Provider, MD  promethazine (PHENERGAN) 25 MG tablet Take 1 tablet (25 mg total) by mouth every 6 (six) hours as needed for nausea or vomiting. 01/21/15  Yes Robyn M Hess, PA-C  Tapentadol HCl 75 MG TABS Take 75 mg by mouth every 6 (six) hours as needed (pain).    Yes Historical Provider, MD  diphenhydrAMINE (BENADRYL) 25 MG tablet Take 1 tablet (25 mg total) by mouth every 6 (six) hours. For the next 3 days 08/18/14   Linton Flemings, MD  famotidine (  PEPCID) 20 MG tablet Take 1 tablet (20 mg total) by mouth 2 (two) times daily. Patient not taking: Reported on 08/18/2014 03/23/14   Fredia Sorrow, MD  methocarbamol (ROBAXIN-750) 750 MG tablet Take 1 tablet (750 mg total) by mouth every 6 (six) hours as needed for muscle spasms. Patient not taking: Reported on 03/31/2015 08/18/14   Linton Flemings, MD  sucralfate (CARAFATE) 1 GM/10ML suspension Take 10 mLs (1 g total) by mouth 4  (four) times daily -  with meals and at bedtime. Patient taking differently: Take 1 g by mouth 3 (three) times daily as needed (cronic pantiritious).  05/04/14   Quintella Reichert, MD   BP 129/98 mmHg  Pulse 120  Temp(Src) 98.6 F (37 C) (Oral)  Resp 18  SpO2 100% Physical Exam  Constitutional: He appears well-developed and well-nourished. No distress.  HENT:  Head: Normocephalic and atraumatic.  Eyes: Conjunctivae are normal.  Neck: Neck supple.  Cardiovascular: Normal rate, regular rhythm and normal heart sounds.   Pulmonary/Chest: Effort normal. No respiratory distress. He has no wheezes. He has no rales.  Abdominal: Soft. Bowel sounds are normal. He exhibits no distension. There is tenderness. There is no rebound.  Epigastric and luq tenderness  Musculoskeletal: He exhibits no edema.  Neurological: He is alert.  Skin: Skin is warm and dry.  Nursing note and vitals reviewed.   ED Course  Procedures (including critical care time) Labs Review Labs Reviewed  CBC - Abnormal; Notable for the following:    WBC 10.6 (*)    MCHC 36.1 (*)    All other components within normal limits  COMPREHENSIVE METABOLIC PANEL - Abnormal; Notable for the following:    Glucose, Bld 110 (*)    All other components within normal limits  URINALYSIS, ROUTINE W REFLEX MICROSCOPIC (NOT AT [facility])  LIPASE, BLOOD  LIPASE, BLOOD  COMPREHENSIVE METABOLIC PANEL  CBC    Imaging Review Dg Abd 2 Views  03/31/2015  CLINICAL DATA:  Left upper abdominal pain and vomiting today. History of chronic pancreatitis, appendectomy, cholecystectomy. EXAM: ABDOMEN - 2 VIEW COMPARISON:  05/04/2014 FINDINGS: Scattered gas and stool in the colon. Gas-filled nondilated small bowel in the upper abdomen. No small or large bowel distention. No free intra-abdominal air. No abnormal air-fluid levels. Changes likely due to ileus or enteritis. No radiopaque stones. Calcified phleboliths in the pelvis. Calcifications in the low pelvis  probably prostatic. Visualized bones appear intact. IMPRESSION: Nonobstructive bowel gas pattern. Diffusely stool-filled colon. Gas-filled nondistended small bowel may indicate ileus or enteritis. Electronically Signed   By: Lucienne Capers M.D.   On: 03/31/2015 20:08   I have personally reviewed and evaluated these images and lab results as part of my medical decision-making.   EKG Interpretation None      MDM   Final diagnoses:  Abdominal pain  Non-intractable vomiting with nausea, vomiting of unspecified type    patient with acute onset of abdominal pain just 45 minutes prior to coming in. Multiple episodes of nausea and vomiting. He states he feels like his pancreatitis. Tenderness to left upper quadrant and epigastric area, no guarding or rebound tenderness. He is afebrile. He is tachycardic, normal blood pressure. Patient appears to be in a lot of pain. Will start IV, pain medications, antiemetics, labs.  11:09 PM Patient received 3 rounds of pain medications. His x-ray shows enteritis versus ileus otherwise unremarkable. Labs show slightly elevated white blood cell count at 10.6. Lipase LFTs electrolytes are within normal. Urinalysis is normal. Patient hydrated  with normal saline. He feels much better and tolerating by mouth fluids in emergency department. He states he has medications at home for pain and for nausea. He has an appointment set up for next week with his gastroenterologist as well. He wants to try to go home. We'll discharge home, return precautions discussed.  Filed Vitals:   03/31/15 1738 03/31/15 1851 03/31/15 1931 03/31/15 2225  BP: 129/98  122/77 144/89  Pulse: 120  80 77  Temp: 98.6 F (37 C)  98 F (36.7 C) 98 F (36.7 C)  TempSrc: Oral  Oral Oral  Resp: 18  18 18   Height:  6\' 1"  (1.854 m)    Weight:  215 lb (97.523 kg)    SpO2: 100%  94% 95%     Jeannett Senior, PA-C 04/01/15 0030  Merrily Pew, MD 04/05/15 1425

## 2015-03-31 NOTE — Discharge Instructions (Signed)
Take pain and nausea medications at home. Follow up with your doctor next week. Return if worsening.   Acute Pancreatitis Acute pancreatitis is a disease in which the pancreas becomes suddenly inflamed. The pancreas is a large gland located behind your stomach. The pancreas produces enzymes that help digest food. The pancreas also releases the hormones glucagon and insulin that help regulate blood sugar. Damage to the pancreas occurs when the digestive enzymes from the pancreas are activated and begin attacking the pancreas before being released into the intestine. Most acute attacks last a couple of days and can cause serious complications. Some people become dehydrated and develop low blood pressure. In severe cases, bleeding into the pancreas can lead to shock and can be life-threatening. The lungs, heart, and kidneys may fail. CAUSES  Pancreatitis can happen to anyone. In some cases, the cause is unknown. Most cases are caused by:  Alcohol abuse.  Gallstones. Other less common causes are:  Certain medicines.  Exposure to certain chemicals.  Infection.  Damage caused by an accident (trauma).  Abdominal surgery. SYMPTOMS   Pain in the upper abdomen that may radiate to the back.  Tenderness and swelling of the abdomen.  Nausea and vomiting. DIAGNOSIS  Your caregiver will perform a physical exam. Blood and stool tests may be done to confirm the diagnosis. Imaging tests may also be done, such as X-rays, CT scans, or an ultrasound of the abdomen. TREATMENT  Treatment usually requires a stay in the hospital. Treatment may include:  Pain medicine.  Fluid replacement through an intravenous line (IV).  Placing a tube in the stomach to remove stomach contents and control vomiting.  Not eating for 3 or 4 days. This gives your pancreas a rest, because enzymes are not being produced that can cause further damage.  Antibiotic medicines if your condition is caused by an  infection.  Surgery of the pancreas or gallbladder. HOME CARE INSTRUCTIONS   Follow the diet advised by your caregiver. This may involve avoiding alcohol and decreasing the amount of fat in your diet.  Eat smaller, more frequent meals. This reduces the amount of digestive juices the pancreas produces.  Drink enough fluids to keep your urine clear or pale yellow.  Only take over-the-counter or prescription medicines as directed by your caregiver.  Avoid drinking alcohol if it caused your condition.  Do not smoke.  Get plenty of rest.  Check your blood sugar at home as directed by your caregiver.  Keep all follow-up appointments as directed by your caregiver. SEEK MEDICAL CARE IF:   You do not recover as quickly as expected.  You develop new or worsening symptoms.  You have persistent pain, weakness, or nausea.  You recover and then have another episode of pain. SEEK IMMEDIATE MEDICAL CARE IF:   You are unable to eat or keep fluids down.  Your pain becomes severe.  You have a fever or persistent symptoms for more than 2 to 3 days.  You have a fever and your symptoms suddenly get worse.  Your skin or the white part of your eyes turn yellow (jaundice).  You develop vomiting.  You feel dizzy, or you faint.  Your blood sugar is high (over 300 mg/dL). MAKE SURE YOU:   Understand these instructions.  Will watch your condition.  Will get help right away if you are not doing well or get worse.   This information is not intended to replace advice given to you by your health care provider. Make sure  you discuss any questions you have with your health care provider.   Document Released: 05/14/2005 Document Revised: 11/13/2011 Document Reviewed: 08/23/2011 Elsevier Interactive Patient Education [employer].

## 2015-04-02 ENCOUNTER — Emergency Department (HOSPITAL_BASED_OUTPATIENT_CLINIC_OR_DEPARTMENT_OTHER)
Admission: EM | Admit: 2015-04-02 | Discharge: 2015-04-02 | Disposition: A | Discharge status: Home / Self Care | Payer: 59 | Attending: Physician Assistant | Admitting: Physician Assistant

## 2015-04-02 ENCOUNTER — Encounter (HOSPITAL_BASED_OUTPATIENT_CLINIC_OR_DEPARTMENT_OTHER): Payer: Self-pay | Admitting: Emergency Medicine

## 2015-04-02 DIAGNOSIS — R109 Unspecified abdominal pain: Secondary | ICD-10-CM | POA: Diagnosis present

## 2015-04-02 DIAGNOSIS — G8929 Other chronic pain: Secondary | ICD-10-CM | POA: Insufficient documentation

## 2015-04-02 DIAGNOSIS — Z9049 Acquired absence of other specified parts of digestive tract: Secondary | ICD-10-CM | POA: Insufficient documentation

## 2015-04-02 DIAGNOSIS — Z8719 Personal history of other diseases of the digestive system: Secondary | ICD-10-CM | POA: Insufficient documentation

## 2015-04-02 DIAGNOSIS — Z79899 Other long term (current) drug therapy: Secondary | ICD-10-CM | POA: Diagnosis not present

## 2015-04-02 DIAGNOSIS — Z720 Tobacco use: Secondary | ICD-10-CM | POA: Diagnosis not present

## 2015-04-02 DIAGNOSIS — R110 Nausea: Secondary | ICD-10-CM | POA: Insufficient documentation

## 2015-04-02 DIAGNOSIS — Z9889 Other specified postprocedural states: Secondary | ICD-10-CM | POA: Diagnosis not present

## 2015-04-02 DIAGNOSIS — R1084 Generalized abdominal pain: Secondary | ICD-10-CM | POA: Insufficient documentation

## 2015-04-02 DIAGNOSIS — Z87442 Personal history of urinary calculi: Secondary | ICD-10-CM | POA: Diagnosis not present

## 2015-04-02 LAB — CBC
HCT: 43.3 % (ref 39.0–52.0)
Hemoglobin: 15.3 g/dL (ref 13.0–17.0)
MCH: 29.7 pg (ref 26.0–34.0)
MCHC: 35.3 g/dL (ref 30.0–36.0)
MCV: 83.9 fL (ref 78.0–100.0)
Platelets: 207 K/uL (ref 150–400)
RBC: 5.16 MIL/uL (ref 4.22–5.81)
RDW: 13.4 % (ref 11.5–15.5)
WBC: 9.5 K/uL (ref 4.0–10.5)

## 2015-04-02 LAB — URINALYSIS, ROUTINE W REFLEX MICROSCOPIC
Bilirubin Urine: NEGATIVE
Glucose, UA: NEGATIVE mg/dL
Hgb urine dipstick: NEGATIVE
Ketones, ur: NEGATIVE mg/dL
Leukocytes, UA: NEGATIVE
Nitrite: NEGATIVE
Protein, ur: NEGATIVE mg/dL
Specific Gravity, Urine: 1.017 (ref 1.005–1.030)
Urobilinogen, UA: 1 mg/dL (ref 0.0–1.0)
pH: 6.5 (ref 5.0–8.0)

## 2015-04-02 LAB — COMPREHENSIVE METABOLIC PANEL
ALBUMIN: 4.1 g/dL (ref 3.5–5.0)
ALK PHOS: 55 U/L (ref 38–126)
ALT: 22 U/L (ref 17–63)
AST: 35 U/L (ref 15–41)
Anion gap: 8 (ref 5–15)
BUN: 12 mg/dL (ref 6–20)
CALCIUM: 8.9 mg/dL (ref 8.9–10.3)
CHLORIDE: 105 mmol/L (ref 101–111)
CO2: 25 mmol/L (ref 22–32)
CREATININE: 0.86 mg/dL (ref 0.61–1.24)
GFR calc non Af Amer: >60 mL/min (ref >60)
Glucose, Bld: 137 mg/dL — ABNORMAL HIGH (ref 65–99)
Potassium: 3.3 mmol/L — ABNORMAL LOW (ref 3.5–5.1)
Sodium: 138 mmol/L (ref 135–145)
Total Bilirubin: 0.7 mg/dL (ref 0.3–1.2)
Total Protein: 6.9 g/dL (ref 6.5–8.1)

## 2015-04-02 LAB — LIPASE, BLOOD: Lipase: 30 U/L (ref 11–51)

## 2015-04-02 MED ORDER — SODIUM CHLORIDE 0.9 % IV BOLUS (SEPSIS)
1000.0000 mL | Freq: Once | INTRAVENOUS | Status: AC
  Administered 2015-04-02: 1000 mL via INTRAVENOUS

## 2015-04-02 MED ORDER — PROMETHAZINE HCL 25 MG/ML IJ SOLN
12.5000 mg | Freq: Once | INTRAMUSCULAR | Status: AC
  Administered 2015-04-02: 12.5 mg via INTRAVENOUS
  Filled 2015-04-02: qty 1

## 2015-04-02 MED ORDER — LORAZEPAM 2 MG/ML IJ SOLN
0.5000 mg | Freq: Once | INTRAMUSCULAR | Status: AC
  Administered 2015-04-02: 0.5 mg via INTRAVENOUS
  Filled 2015-04-02: qty 1

## 2015-04-02 MED ORDER — DIPHENHYDRAMINE HCL 50 MG/ML IJ SOLN
25.0000 mg | Freq: Once | INTRAMUSCULAR | Status: AC
  Administered 2015-04-02: 25 mg via INTRAVENOUS
  Filled 2015-04-02: qty 1

## 2015-04-02 MED ORDER — HYDROMORPHONE HCL 1 MG/ML IJ SOLN
1.0000 mg | Freq: Once | INTRAMUSCULAR | Status: AC
  Administered 2015-04-02: 1 mg via INTRAVENOUS
  Filled 2015-04-02: qty 1

## 2015-04-02 NOTE — ED Provider Notes (Signed)
CSN: [id number]     Arrival date & time 04/02/15  1405 History  By signing my name below, I, Arianna Nassar, attest that this documentation has been prepared under the direction and in the presence of Margarit Minshall Julio Alm, MD. Electronically Signed: Julien Nordmann, ED Scribe. 04/02/2015. 3:49 PM.    Chief Complaint  Patient presents with  . Abdominal Pain      The history is provided by the patient. No language interpreter was used.   HPI Comments: Hubbert Landrigan is a 39 y.o. male who presents to the Emergency Department complaining of constant, gradual worsening abdominal pain due to his hx of pancreatitis. He has been having associated nausea. Pt was seen on 11/3 for the same symptoms. He notes his symptoms have not gone away. He currently has a pain control plan. Pt has an appointment with his pain management doctor on Thursday.   Past Medical History  Diagnosis Date  . Pancreatitis   . Kidney stones    Past Surgical History  Procedure Laterality Date  . Appendectomy    . Cholecystectomy    . Kidney stone surgery    . Ercp w/ metal stent placement     History reviewed. No pertinent family history. Social History  Substance Use Topics  . Smoking status: Current Every Day Smoker -- 0.50 packs/day    Types: Cigarettes  . Smokeless tobacco: Never Used  . Alcohol Use: Yes     Comment: rarely, pt stated that he drinks once ever 3 months and it is usually a beer at dinner    Review of Systems  Gastrointestinal: Positive for nausea and abdominal pain.  All other systems reviewed and are negative.     Allergies  Cortisone; Eggs or egg-derived products; Iodinated diagnostic agents; Ketorolac tromethamine; and Haldol  Home Medications   Prior to Admission medications   Medication Sig Start Date End Date Taking? Authorizing Provider  clonazePAM (KLONOPIN) 0.5 MG tablet Take 0.5 mg by mouth daily. 12/16/14   Historical Provider, MD  Cyanocobalamin (B-12 PO) Take 1 tablet by  mouth daily.    Historical Provider, MD  diphenhydrAMINE (BENADRYL) 25 MG tablet Take 1 tablet (25 mg total) by mouth every 6 (six) hours. For the next 3 days 08/18/14   Linton Flemings, MD  Doxylamine-Phenylephrine-APAP [facility] NYQUIL SINUS) 6.25-5-325 MG CAPS Take 1 capsule by mouth daily as needed (for sinus).    Historical Provider, MD  famotidine (PEPCID) 20 MG tablet Take 1 tablet (20 mg total) by mouth 2 (two) times daily. Patient not taking: Reported on 08/18/2014 03/23/14   Fredia Sorrow, MD  fentaNYL (DURAGESIC - DOSED MCG/HR) 50 MCG/HR Place 50 mcg onto the skin every 3 (three) days.    Historical Provider, MD  loratadine (CLARITIN) 10 MG tablet Take 10 mg by mouth daily as needed for allergies.    Historical Provider, MD  Melatonin 5 MG TABS Take 10 mg by mouth at bedtime as needed (for sleep).     Historical Provider, MD  methocarbamol (ROBAXIN-750) 750 MG tablet Take 1 tablet (750 mg total) by mouth every 6 (six) hours as needed for muscle spasms. Patient not taking: Reported on 03/31/2015 08/18/14   Linton Flemings, MD  Multiple Vitamin (MULTIVITAMIN WITH MINERALS) TABS tablet Take 1 tablet by mouth daily.    Historical Provider, MD  promethazine (PHENERGAN) 25 MG tablet Take 1 tablet (25 mg total) by mouth every 6 (six) hours as needed for nausea or vomiting. 01/21/15   Carman Ching,  PA-C  sucralfate (CARAFATE) 1 GM/10ML suspension Take 10 mLs (1 g total) by mouth 4 (four) times daily -  with meals and at bedtime. Patient taking differently: Take 1 g by mouth 3 (three) times daily as needed (cronic pantiritious).  05/04/14   Quintella Reichert, MD  Tapentadol HCl 75 MG TABS Take 75 mg by mouth every 6 (six) hours as needed (pain).     Historical Provider, MD   Triage vitals: BP 123/95 mmHg  Pulse 100  Temp(Src) 98.3 F (36.8 C) (Oral)  Resp 18  Ht 6\' 1"  (1.854 m)  Wt 210 lb (95.255 kg)  BMI 27.71 kg/m2  SpO2 95% Physical Exam  Constitutional: He is oriented to person, place, and time. He  appears well-developed and well-nourished.  HENT:  Head: Normocephalic and atraumatic.  Eyes: EOM are normal.  Neck: Normal range of motion.  Cardiovascular: Normal rate, regular rhythm, normal heart sounds and intact distal pulses.   Pulmonary/Chest: Effort normal and breath sounds normal. No respiratory distress.  Abdominal: Soft. He exhibits no distension. There is tenderness.  Diffusely tender, distractable  Musculoskeletal: Normal range of motion.  Neurological: He is alert and oriented to person, place, and time.  Skin: Skin is warm and dry.  Psychiatric: He has a normal mood and affect. Judgment normal.  Nursing note and vitals reviewed.   ED Course  Procedures  DIAGNOSTIC STUDIES: Oxygen Saturation is 95% on RA, normal by my interpretation.  COORDINATION OF CARE:  3:21 PM Discussed treatment plan which includes lab work, pain medication with pt at bedside and pt agreed to plan.  Labs Review Labs Reviewed  COMPREHENSIVE METABOLIC PANEL - Abnormal; Notable for the following:    Potassium 3.3 (*)    Glucose, Bld 137 (*)    All other components within normal limits  URINALYSIS, ROUTINE W REFLEX MICROSCOPIC (NOT AT [facility])  CBC  LIPASE, BLOOD    Imaging Review Dg Abd 2 Views  03/31/2015  CLINICAL DATA:  Left upper abdominal pain and vomiting today. History of chronic pancreatitis, appendectomy, cholecystectomy. EXAM: ABDOMEN - 2 VIEW COMPARISON:  05/04/2014 FINDINGS: Scattered gas and stool in the colon. Gas-filled nondilated small bowel in the upper abdomen. No small or large bowel distention. No free intra-abdominal air. No abnormal air-fluid levels. Changes likely due to ileus or enteritis. No radiopaque stones. Calcified phleboliths in the pelvis. Calcifications in the low pelvis probably prostatic. Visualized bones appear intact. IMPRESSION: Nonobstructive bowel gas pattern. Diffusely stool-filled colon. Gas-filled nondistended small bowel may indicate ileus or enteritis.  Electronically Signed   By: Lucienne Capers M.D.   On: 03/31/2015 20:08   I have personally reviewed and evaluated these images and lab results as part of my medical decision-making.   EKG Interpretation None      MDM   Final diagnoses:  None    Patient is a 39 year old male with chronic abdominal pain. Patient's been seen at least 13 times in the last year for the same complaint. Patient has a chronic pain physician, pain doctor. He also has a GI physician that sees him at [facility]. Patient here with his same chronic pain that he always comes in with. Nothing different. No fevers. Patient reports mild nausea, and chronic pain. Patient has multiple medication that he takes for pain at home. He reports going to his chronic pain physician on Thursday for worsening pain. They would not give him anything. They told him to "come to the emergency department if he had increased  pain"  Since patient has a tight pain contract,i only feel comfortable giving him one dose of IV pain medication. And then we will switch to oral medications. I'll give him fluids and antiemetics. I will do labs to make sure that he does not have electrolyte imbalance given he says she's been vomiting.    I personally performed the services described in this documentation, which was scribed in my presence. The recorded information has been reviewed and is accurate.   I personally performed the services described in this documentation, which was scribed in my presence. The recorded information has been reviewed and is accurate.    5:18 PM Patient's labs and vitals are normal.  Went to discuss with patient. Patient's wife asking for more dialudid.  Patient has not had any vomiting and I feel he should try PO medications. I repeated I do not feel comfortable more IV narcotics.   Patient's wife asking why I have not called his pain specialist. Patient called yesterday ad they would not give more meds. He did  not call today.  I will try to call paitnet's pain specialist.   I discussed patient with his pain doctor on call. His doctor on call reports that his usual pain doctor usually frowns upon IV narcotics. I told thepatient that this is what his doctor said and will not be giving any more IV narcotics today. We offered more antiemetics more fluids and Ativan.   Patient has not vomited and has normal vital signs on discharge.   Cara Thaxton Julio Alm, MD 04/02/15 1941

## 2015-04-02 NOTE — Discharge Instructions (Signed)
Please follow-up with your chronic pain physician.   Abdominal Pain, Adult Many things can cause belly (abdominal) pain. Most times, the belly pain is not dangerous. Many cases of belly pain can be watched and treated at home. HOME CARE   Do not take medicines that help you go poop (laxatives) unless told to by your doctor.  Only take medicine as told by your doctor.  Eat or drink as told by your doctor. Your doctor will tell you if you should be on a special diet. GET HELP IF:  You do not know what is causing your belly pain.  You have belly pain while you are sick to your stomach (nauseous) or have runny poop (diarrhea).  You have pain while you pee or poop.  Your belly pain wakes you up at night.  You have belly pain that gets worse or better when you eat.  You have belly pain that gets worse when you eat fatty foods.  You have a fever. GET HELP RIGHT AWAY IF:   The pain does not go away within 2 hours.  You keep throwing up (vomiting).  The pain changes and is only in the right or left part of the belly.  You have bloody or tarry looking poop. MAKE SURE YOU:   Understand these instructions.  Will watch your condition.  Will get help right away if you are not doing well or get worse.   This information is not intended to replace advice given to you by your health care provider. Make sure you discuss any questions you have with your health care provider.   Document Released: 10/31/2007 Document Revised: 06/04/2014 Document Reviewed: 01/21/2013 Elsevier Interactive Patient Education [employer].

## 2015-04-02 NOTE — ED Notes (Signed)
20g piv removed from left [facility]- cath intact- site WNL

## 2015-04-02 NOTE — ED Notes (Signed)
MD at bedside to discuss plan of care with patient and family after speaking with pt's doctor's and receiving recommendations

## 2015-04-02 NOTE — ED Notes (Signed)
Work note given- d/c home with spouse

## 2015-04-02 NOTE — ED Notes (Signed)
MD at bedside. 

## 2015-04-02 NOTE — ED Notes (Signed)
Pt with chronic pancreatitis, seen at [facility], improved and today pain returned. Has appt with gi md thur

## 2015-04-16 ENCOUNTER — Emergency Department (HOSPITAL_COMMUNITY): Payer: 59

## 2015-04-16 ENCOUNTER — Emergency Department (HOSPITAL_COMMUNITY)
Admission: EM | Admit: 2015-04-16 | Discharge: 2015-04-16 | Disposition: A | Discharge status: Home / Self Care | Payer: 59 | Attending: Emergency Medicine | Admitting: Emergency Medicine

## 2015-04-16 ENCOUNTER — Encounter (HOSPITAL_COMMUNITY): Payer: Self-pay

## 2015-04-16 DIAGNOSIS — G8929 Other chronic pain: Secondary | ICD-10-CM | POA: Insufficient documentation

## 2015-04-16 DIAGNOSIS — R109 Unspecified abdominal pain: Secondary | ICD-10-CM

## 2015-04-16 DIAGNOSIS — R1084 Generalized abdominal pain: Secondary | ICD-10-CM | POA: Insufficient documentation

## 2015-04-16 DIAGNOSIS — Z79899 Other long term (current) drug therapy: Secondary | ICD-10-CM | POA: Diagnosis not present

## 2015-04-16 DIAGNOSIS — Z87442 Personal history of urinary calculi: Secondary | ICD-10-CM | POA: Insufficient documentation

## 2015-04-16 DIAGNOSIS — Z9889 Other specified postprocedural states: Secondary | ICD-10-CM | POA: Insufficient documentation

## 2015-04-16 DIAGNOSIS — F17210 Nicotine dependence, cigarettes, uncomplicated: Secondary | ICD-10-CM | POA: Insufficient documentation

## 2015-04-16 DIAGNOSIS — R112 Nausea with vomiting, unspecified: Secondary | ICD-10-CM | POA: Insufficient documentation

## 2015-04-16 LAB — COMPREHENSIVE METABOLIC PANEL
ALBUMIN: 5 g/dL (ref 3.5–5.0)
ALK PHOS: 65 U/L (ref 38–126)
ALT: 18 U/L (ref 17–63)
ANION GAP: 12 (ref 5–15)
AST: 25 U/L (ref 15–41)
BILIRUBIN TOTAL: 1 mg/dL (ref 0.3–1.2)
BUN: 18 mg/dL (ref 6–20)
CALCIUM: 10.4 mg/dL — AB (ref 8.9–10.3)
CO2: 20 mmol/L — ABNORMAL LOW (ref 22–32)
Chloride: 110 mmol/L (ref 101–111)
Creatinine, Ser: 0.87 mg/dL (ref 0.61–1.24)
GFR calc Af Amer: >60 mL/min (ref >60)
GLUCOSE: 122 mg/dL — AB (ref 65–99)
Potassium: 3.3 mmol/L — ABNORMAL LOW (ref 3.5–5.1)
Sodium: 142 mmol/L (ref 135–145)
TOTAL PROTEIN: 8 g/dL (ref 6.5–8.1)

## 2015-04-16 LAB — URINALYSIS, ROUTINE W REFLEX MICROSCOPIC
Glucose, UA: NEGATIVE mg/dL
Hgb urine dipstick: NEGATIVE
KETONES UR: 15 mg/dL — AB
NITRITE: NEGATIVE
Protein, ur: 30 mg/dL — AB
SPECIFIC GRAVITY, URINE: 1.028 (ref 1.005–1.030)
pH: 6 (ref 5.0–8.0)

## 2015-04-16 LAB — CBC
HCT: 45.6 % (ref 39.0–52.0)
HEMOGLOBIN: 16.6 g/dL (ref 13.0–17.0)
MCH: 30.2 pg (ref 26.0–34.0)
MCHC: 36.4 g/dL — AB (ref 30.0–36.0)
MCV: 82.9 fL (ref 78.0–100.0)
Platelets: 220 10*3/uL (ref 150–400)
RBC: 5.5 MIL/uL (ref 4.22–5.81)
RDW: 12.9 % (ref 11.5–15.5)
WBC: 13.2 10*3/uL — ABNORMAL HIGH (ref 4.0–10.5)

## 2015-04-16 LAB — URINE MICROSCOPIC-ADD ON: SQUAMOUS EPITHELIAL / LPF: NONE SEEN

## 2015-04-16 LAB — LIPASE, BLOOD: Lipase: 27 U/L (ref 11–51)

## 2015-04-16 MED ORDER — HYDROMORPHONE HCL 1 MG/ML IJ SOLN
1.0000 mg | Freq: Once | INTRAMUSCULAR | Status: AC
  Administered 2015-04-16: 1 mg via INTRAVENOUS
  Filled 2015-04-16: qty 1

## 2015-04-16 MED ORDER — SODIUM CHLORIDE 0.9 % IV BOLUS (SEPSIS)
1000.0000 mL | Freq: Once | INTRAVENOUS | Status: AC
  Administered 2015-04-16: 1000 mL via INTRAVENOUS

## 2015-04-16 MED ORDER — ONDANSETRON HCL 4 MG/2ML IJ SOLN
4.0000 mg | Freq: Once | INTRAMUSCULAR | Status: AC
  Administered 2015-04-16: 4 mg via INTRAVENOUS
  Filled 2015-04-16: qty 2

## 2015-04-16 NOTE — ED Notes (Signed)
EKG gvien to EDP,Linker,MD., for review.

## 2015-04-16 NOTE — ED Provider Notes (Signed)
CSN: [id number]     Arrival date & time 04/16/15  1651 History   First MD Initiated Contact with Patient 04/16/15 1721     Chief Complaint  Patient presents with  . Flank Pain  . Abdominal Pain  . Tachycardia     (Consider location/radiation/quality/duration/timing/severity/associated sxs/prior Treatment) HPI  Pt presenting with c/o left sided abdominal pain with radiation to left flank.  He states pain began last night and it has become worse today.  He has had some nausea and vomiting associated today.  Pain is constant and sore.   No fever/chills.  No difficulty or pain with urinating.  Has not seen blood in his urine.  States the pain feels somewhat like his pancreatitis and somewhat like prior kidney stones he has had.  There are no other associated systemic symptoms, there are no other alleviating or modifying factors.   Past Medical History  Diagnosis Date  . Pancreatitis   . Kidney stones    Past Surgical History  Procedure Laterality Date  . Appendectomy    . Cholecystectomy    . Kidney stone surgery    . Ercp w/ metal stent placement     Family History  Problem Relation Age of Onset  . Cancer Mother   . Heart failure Father    Social History  Substance Use Topics  . Smoking status: Current Every Day Smoker -- 0.50 packs/day    Types: Cigarettes  . Smokeless tobacco: Never Used  . Alcohol Use: Yes     Comment: rarely, pt stated that he drinks once ever 3 months and it is usually a beer at dinner    Review of Systems  ROS reviewed and all otherwise negative except for mentioned in HPI    Allergies  Cortisone; Eggs or egg-derived products; Iodinated diagnostic agents; Ketorolac tromethamine; and Haldol  Home Medications   Prior to Admission medications   Medication Sig Start Date End Date Taking? Authorizing Provider  clonazePAM (KLONOPIN) 0.5 MG tablet Take 0.5 mg by mouth 2 (two) times daily.  12/16/14  Yes Historical Provider, MD  fentaNYL (DURAGESIC -  DOSED MCG/HR) 50 MCG/HR Place 50 mcg onto the skin every 3 (three) days.   Yes Historical Provider, MD  promethazine (PHENERGAN) 25 MG tablet Take 1 tablet (25 mg total) by mouth every 6 (six) hours as needed for nausea or vomiting. 01/21/15  Yes Robyn M Hess, PA-C  Tapentadol HCl 75 MG TABS Take 75 mg by mouth every 6 (six) hours as needed (pain).    Yes Historical Provider, MD  diphenhydrAMINE (BENADRYL) 25 MG tablet Take 1 tablet (25 mg total) by mouth every 6 (six) hours. For the next 3 days Patient not taking: Reported on 04/16/2015 08/18/14   Linton Flemings, MD  famotidine (PEPCID) 20 MG tablet Take 1 tablet (20 mg total) by mouth 2 (two) times daily. Patient not taking: Reported on 08/18/2014 03/23/14   Fredia Sorrow, MD  methocarbamol (ROBAXIN-750) 750 MG tablet Take 1 tablet (750 mg total) by mouth every 6 (six) hours as needed for muscle spasms. Patient not taking: Reported on 03/31/2015 08/18/14   Linton Flemings, MD  sucralfate (CARAFATE) 1 GM/10ML suspension Take 10 mLs (1 g total) by mouth 4 (four) times daily -  with meals and at bedtime. Patient not taking: Reported on 04/16/2015 05/04/14   Quintella Reichert, MD   BP 135/83 mmHg  Pulse 96  Temp(Src) 98.5 F (36.9 C) (Oral)  Resp 14  Ht 6\' 1"  (1.854 m)  Wt 205 lb (92.987 kg)  BMI 27.05 kg/m2  SpO2 96%  Vitals reviewed Physical Exam  Physical Examination: General appearance - alert, well appearing, and in no distress Mental status - alert, oriented to person, place, and time Eyes - no conjunctiva lnjection,  No scleral icterus Mouth - mucous membranes moist, pharynx normal without lesions Chest - clear to auscultation, no wheezes, rales or rhonchi, symmetric air entry Heart - normal rate, regular rhythm, normal S1, S2, no murmurs, rubs, clicks or gallops Abdomen - soft, mild diffuse tenderness to palpation, nondistended, no masses or organomegaly Back- mild left CVA tenderness, no midline tenderness to palpation Neurological - alert,  oriented, normal speech Extremities - peripheral pulses normal, no pedal edema, no clubbing or cyanosis Skin - normal coloration and turgor, no rashes  ED Course  Procedures (including critical care time) Labs Review Labs Reviewed  COMPREHENSIVE METABOLIC PANEL - Abnormal; Notable for the following:    Potassium 3.3 (*)    CO2 20 (*)    Glucose, Bld 122 (*)    Calcium 10.4 (*)    All other components within normal limits  CBC - Abnormal; Notable for the following:    WBC 13.2 (*)    MCHC 36.4 (*)    All other components within normal limits  URINALYSIS, ROUTINE W REFLEX MICROSCOPIC (NOT AT [facility]) - Abnormal; Notable for the following:    Color, Urine AMBER (*)    APPearance CLOUDY (*)    Bilirubin Urine SMALL (*)    Ketones, ur 15 (*)    Protein, ur 30 (*)    Leukocytes, UA SMALL (*)    All other components within normal limits  URINE MICROSCOPIC-ADD ON - Abnormal; Notable for the following:    Bacteria, UA RARE (*)    Casts HYALINE CASTS (*)    Crystals CA OXALATE CRYSTALS (*)    All other components within normal limits  LIPASE, BLOOD    Imaging Review Ct Abdomen Pelvis Wo Contrast  04/16/2015  CLINICAL DATA:  Patient with left flank pain and left upper abdominal pain. History of renal stones. EXAM: CT ABDOMEN AND PELVIS WITHOUT CONTRAST TECHNIQUE: Multidetector CT imaging of the abdomen and pelvis was performed following the standard protocol without IV contrast. COMPARISON:  CT abdomen pelvis 03/23/2014 FINDINGS: Lower chest: Normal heart size. No consolidative or nodular pulmonary opacities. No pleural effusion. Hepatobiliary: Patient status post cholecystectomy. Unchanged central intrahepatic biliary ductal dilatation. The liver is normal in size and contour. Pancreas: Unremarkable Spleen: Unremarkable Adrenals/Urinary Tract: The adrenal glands are normal. Nonobstructing 3 mm stone within the interpolar region of the left kidney. No hydronephrosis. No ureterolithiasis.  Urinary bladder is unremarkable. Stomach/Bowel: Sigmoid colonic diverticulosis. No CT evidence for acute diverticulitis. The appendix is not visualized. Normal morphology to the stomach. No abnormal bowel wall thickening or evidence for bowel obstruction. No free fluid or free intraperitoneal air. Vascular/Lymphatic: Normal caliber abdominal aorta. No retroperitoneal lymphadenopathy. Other: Coarse dystrophic calcifications within the prostate. Musculoskeletal: No aggressive or acute appearing osseous lesions. IMPRESSION: No acute process within the abdomen or pelvis. Nonobstructing stone within the interpolar region of the left kidney. No hydronephrosis. Stable intrahepatic biliary ductal dilatation status post cholecystectomy. Electronically Signed   By: Lovey Newcomer M.D.   On: 04/16/2015 18:55   I have personally reviewed and evaluated these images and lab results as part of my medical decision-making.   EKG Interpretation   Date/Time:  Saturday April 16 2015 17:11:27 EST Ventricular Rate:  126 PR Interval:  136 QRS Duration: 92 QT Interval:  325 QTC Calculation: 470 R Axis:   63 Text Interpretation:  Sinus tachycardia Ventricular premature complex  Nonspecific T abnormalities, diffuse leads Baseline wander in lead(s) V4  V5 No significant change since last tracing Confirmed by [facility]  MD,  Saul Dorsi [phone]) on 04/16/2015 5:33:26 PM      MDM   Final diagnoses:  Chronic abdominal pain    Pt presenting with left sided abdominal and flank pain.  He was treated with IV fluids and pain medications in the ED.  Labs are reassuring, no elevation of LFTs to suggest cholecystitis or pancreatitis at this time.  Urine shows no UTI- there are some calcium oxalate crystals- renal stone study did not show an obstructive renal stone.  Per chart review patient has had multiple similar ED visits for abdominal pain. Adivsed to continue using his fentanyl patch- he states he has an appointment in December  2016 with GI specialist.  Encouraged to keep this appointment.  Discharged with strict return precautions.  Pt agreeable with plan.    Alfonzo Beers, MD 04/17/15 [phone]

## 2015-04-16 NOTE — ED Notes (Signed)
Patient reports that he began having left flank pain last night and left upper abdominal pain with N/V this AM. Patient reports a history of kidney stones and pancreatitis.

## 2015-04-16 NOTE — Discharge Instructions (Signed)
Return to the ED with any concerns including vomiting and not able to keep down liquids, worsening abdominal pain, fainting, decreased level of alertness/lethargy, or any other alarming symptoms  You should take your home meds for the pain you are having

## 2015-04-18 ENCOUNTER — Emergency Department (HOSPITAL_COMMUNITY)
Admission: EM | Admit: 2015-04-18 | Discharge: 2015-04-19 | Disposition: A | Discharge status: Home / Self Care | Payer: 59 | Attending: Emergency Medicine | Admitting: Emergency Medicine

## 2015-04-18 ENCOUNTER — Encounter (HOSPITAL_COMMUNITY): Payer: Self-pay | Admitting: *Deleted

## 2015-04-18 DIAGNOSIS — G249 Dystonia, unspecified: Secondary | ICD-10-CM | POA: Diagnosis not present

## 2015-04-18 DIAGNOSIS — Z8719 Personal history of other diseases of the digestive system: Secondary | ICD-10-CM | POA: Diagnosis not present

## 2015-04-18 DIAGNOSIS — Z87442 Personal history of urinary calculi: Secondary | ICD-10-CM | POA: Insufficient documentation

## 2015-04-18 DIAGNOSIS — F17210 Nicotine dependence, cigarettes, uncomplicated: Secondary | ICD-10-CM | POA: Diagnosis not present

## 2015-04-18 DIAGNOSIS — R6884 Jaw pain: Secondary | ICD-10-CM | POA: Diagnosis present

## 2015-04-18 DIAGNOSIS — Z79899 Other long term (current) drug therapy: Secondary | ICD-10-CM | POA: Insufficient documentation

## 2015-04-18 MED ORDER — DIPHENHYDRAMINE HCL 50 MG/ML IJ SOLN
50.0000 mg | Freq: Once | INTRAMUSCULAR | Status: AC
  Administered 2015-04-18: 50 mg via INTRAVENOUS
  Filled 2015-04-18: qty 1

## 2015-04-18 MED ORDER — SODIUM CHLORIDE 0.9 % IV SOLN
INTRAVENOUS | Status: DC
  Administered 2015-04-18: via INTRAVENOUS

## 2015-04-18 NOTE — ED Notes (Signed)
Pt states that he was sitting at home and began to feel his face / jaw tighten; pt states that it began on the left side of his face and spread to the right; pt states that he is unable to open his jaw; pt states that it feels like he is clinching his jaws; pt states that this has happened one time before as a reaction to Haldol; pt denies taking any new medications or doing anything different this evening

## 2015-04-18 NOTE — ED Provider Notes (Signed)
CSN: [id number]     Arrival date & time 04/18/15  2012 History  By signing my name below, I, Emmanuella Mensah, attest that this documentation has been prepared under the direction and in the presence of Rolland Porter, MD at 2310. Electronically Signed: Judithann Sauger, ED Scribe. 04/18/2015. 1:12 AM.    Chief Complaint  Patient presents with  . Jaw Locked    The history is provided by the patient. No language interpreter was used.   HPI Comments: [name] is a 39 y.o. male with a hx of Pancreatitis who presents to the Emergency Department complaining of gradually worsening moderate bilateral sides of his jaw tightening onset 4 hours ago. His wife reports that his face started tightening with pain while pt was at home. Pt reports that the tightness started on the right side and now is on both sides and is currently unable to fully open his mouth. He denies any fever, difficulty swallowing or SOB. He took a 25 mg Benadryl immediately after onset when only the right side of his jaw had tighten up. His wife states that he has these symptoms happened 4 months ago after he had been given haldol and the benadryl helped then but pt denies relief now with the benadryl he took PTA. Pt is currently on Fentanyl patch for chronic pancreatitis and Nucynta. Pt has not taken his Klonopin for 3 weeks now. No longer on Robaxin, Carafate.  Pt was seen here yesterday for abdominal pain and treated with IV fluids and pain medication.  He denies any new medications, OTC meds, or change in doses of medications.   PCP Dr Laurance Flatten  Past Medical History  Diagnosis Date  . Pancreatitis   . Kidney stones    Past Surgical History  Procedure Laterality Date  . Appendectomy    . Cholecystectomy    . Kidney stone surgery    . Ercp w/ metal stent placement     Family History  Problem Relation Age of Onset  . Cancer Mother   . Heart failure Father    Social History  Substance Use Topics  . Smoking status: Current  Every Day Smoker -- 0.50 packs/day    Types: Cigarettes  . Smokeless tobacco: Never Used  . Alcohol Use: Yes     Comment: rarely, pt stated that he drinks once ever 3 months and it is usually a beer at dinner  Pt is a current less than half a pack of cigarette smoker.  His last alcoholic beverage was approx 2 years ago. 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  Review of Systems  Constitutional: Negative for fever and chills.  HENT: Negative for trouble swallowing.   Respiratory: Negative for shortness of breath.   Musculoskeletal: Positive for arthralgias.  All other systems reviewed and are negative.     Allergies  Cortisone; Eggs or egg-derived products; Iodinated diagnostic agents; Ketorolac tromethamine; and Haldol  Home Medications   Prior to Admission medications   Medication Sig Start Date End Date Taking? Authorizing Provider  clonazePAM (KLONOPIN) 0.5 MG tablet Take 0.5 mg by mouth 2 (two) times daily.  12/16/14  Yes Historical Provider, MD  fentaNYL (DURAGESIC - DOSED MCG/HR) 50 MCG/HR Place 50 mcg onto the skin every 3 (three) days.   Yes Historical Provider, MD  promethazine (PHENERGAN) 25 MG tablet Take 1 tablet (25 mg total) by mouth every 6 (six) hours as needed for nausea or vomiting. 01/21/15  Yes Robyn M Hess, PA-C  Tapentadol HCl 75 MG TABS  Take 75 mg by mouth every 6 (six) hours as needed (pain).    Yes Historical Provider, MD  cyclobenzaprine (FLEXERIL) 10 MG tablet Take 1 tablet (10 mg total) by mouth 3 (three) times daily as needed (muscle soreness). 04/19/15   Rolland Porter, MD  naproxen (NAPROSYN) 250 MG tablet Take 1 po BID with food prn pain 04/19/15   Rolland Porter, MD    ED Triage Vitals  Enc Vitals Group     BP 04/18/15 2019 142/106 mmHg     Pulse Rate 04/18/15 2019 120     Resp 04/18/15 2019 16     Temp --      Temp src --      SpO2 04/18/15 2019 96 %     Weight --      Height --      Head Cir --      Peak Flow --      Pain Score 04/18/15 2036 7     Pain Loc --       Pain Edu? --      Excl. in [place]? --    Laboratory interpretation all normal except except diastolic hypertension   Physical Exam  Constitutional: He is oriented to person, place, and time. He appears well-developed and well-nourished.  Non-toxic appearance. He does not appear ill. No distress.  HENT:  Head: Normocephalic and atraumatic.  Right Ear: External ear normal.  Left Ear: External ear normal.  Nose: Nose normal. No mucosal edema or rhinorrhea.  Mouth/Throat: Oropharynx is clear and moist and mucous membranes are normal. No dental abscesses or uvula swelling.  Pt is unable to open his mouth however when I first went in the room, he started to talk and his teeth was separated by half a cm and after that he was unable to separate his teeth at all.   Eyes: Conjunctivae and EOM are normal. Pupils are equal, round, and reactive to light.  Neck: Normal range of motion and full passive range of motion without pain. Neck supple.  Cardiovascular: Normal rate, regular rhythm and normal heart sounds.  Exam reveals no gallop and no friction rub.   No murmur heard. Pulmonary/Chest: Effort normal and breath sounds normal. No respiratory distress. He has no wheezes. He has no rhonchi. He has no rales. He exhibits no tenderness and no crepitus.  Abdominal: Soft. Normal appearance and bowel sounds are normal. He exhibits no distension. There is no tenderness. There is no rebound and no guarding.  Musculoskeletal: Normal range of motion. He exhibits no edema or tenderness.  Moves all extremities well.   Neurological: He is alert and oriented to person, place, and time. He has normal strength. No cranial nerve deficit.  Skin: Skin is warm, dry and intact. No rash noted. No erythema. No pallor.  Psychiatric: He has a normal mood and affect. His speech is normal and behavior is normal. His mood appears not anxious.  Nursing note and vitals reviewed.   ED Course  Procedures (including critical care  time)  Medications  0.9 %  sodium chloride infusion ( Intravenous Stopped 04/19/15 0200)  diphenhydrAMINE (BENADRYL) injection 50 mg (50 mg Intravenous Given 04/18/15 2331)  benztropine mesylate (COGENTIN) injection 2 mg (2 mg Intravenous Given 04/19/15 0056)  diazepam (VALIUM) injection 5 mg (5 mg Intravenous Given 04/19/15 0048)    DIAGNOSTIC STUDIES: Oxygen Saturation is 96% on RA, adequate by my interpretation.    COORDINATION OF CARE: 11:19 PM- Pt advised of plan for treatment  and pt agrees. Pt will receive IV benadryl and if that is unsuccessful, will try other medications.   00 40 a.m. patient reports he's not any better. He was given IV Cogentin and IV Valium.  1:11 AM - Upon reassessment, pt reports that his jaw is starting to loosen up. He is now able to open up his mouth. He states he feels like his back to normal however he now just has some soreness. We discussed getting an IV nonsteroidal however he did not mention he had a allergic reaction to Toradol. He states he can take oral nonsteroidals however he has to be careful because of his pancreatitis. He was given him a loss relaxer to take at home and to use ice and heat for his jaw soreness.   Labs Review Labs Reviewed - No data to display  Imaging Review No results found.     EKG Interpretation None      MDM   patient presents with dystonia. He denies anything new with his medications although he was given Phenergan yesterday when he came to the ED for abdominal pain. This may be the culprit. Patient improved with meds.    Final diagnoses:  Dystonia   Discharge Medication List as of 04/19/2015  1:54 AM    START taking these medications   Details  cyclobenzaprine (FLEXERIL) 10 MG tablet Take 1 tablet (10 mg total) by mouth 3 (three) times daily as needed (muscle soreness)., Starting 04/19/2015, Until Discontinued, Print    naproxen (NAPROSYN) 250 MG tablet Take 1 po BID with food prn pain, Print         Plan discharge   I personally performed the services described in this documentation, which was scribed in my presence. The recorded information has been reviewed and considered.  Rolland Porter, MD, Barbette Or, MD 04/19/15 [phone]

## 2015-04-18 NOTE — ED Notes (Signed)
MD at bedside. 

## 2015-04-19 MED ORDER — NAPROXEN 250 MG PO TABS: ORAL_TABLET | ORAL | Status: DC

## 2015-04-19 MED ORDER — BENZTROPINE MESYLATE 1 MG/ML IJ SOLN
2.0000 mg | Freq: Once | INTRAMUSCULAR | Status: AC
  Administered 2015-04-19: 2 mg via INTRAVENOUS
  Filled 2015-04-19: qty 2

## 2015-04-19 MED ORDER — CYCLOBENZAPRINE HCL 10 MG PO TABS: 10.0000 mg | ORAL_TABLET | Freq: Three times a day (TID) | ORAL | Status: DC | PRN

## 2015-04-19 MED ORDER — DIAZEPAM 5 MG/ML IJ SOLN
5.0000 mg | Freq: Once | INTRAMUSCULAR | Status: AC
  Administered 2015-04-19: 5 mg via INTRAVENOUS
  Filled 2015-04-19: qty 2

## 2015-04-19 NOTE — ED Notes (Signed)
Patient was alert, oriented and stable upon discharge. RN went over AVS and patient had no further questions.  

## 2015-04-19 NOTE — Discharge Instructions (Signed)
Use ice and heat over your sore jaw muscles. Take the medications as prescribed. Recheck if you get worse.    Dystonia Dystonia is a condition that makes your muscles contract without warning (muscle spasms). It can make doing everyday tasks hard. There are different forms of dystonia. The condition can affect just one part of your body, or it can affect larger areas of your body. Dystonia affects people in different ways. In some people, it is mild and goes away over time, while others may need treatment. Although there is no cure for dystonia, you can manage the condition with treatment. CAUSES  Dystonia may be caused by:  Genetics. This means you inherited the genes that cause you to be at risk for dystonia.  An abnormality in the part of your brain that controls movement (basal ganglia). Dystonia may also be acquired. If you have acquired dystonia, you developed the condition after:  Brain injury.  Infection.  Drug reaction. The cause of dystonia may also not be known (idiopathic dystonia).  SIGNS AND SYMPTOMS Signs and symptoms of dystonia can depend on which type of the condition you have. Common signs and symptoms include:  Muscle twitches or spasms around your eyes (blepharospasm).  Foot cramping or dragging.  Pulling of your neck to one side (torticollis).  Muscles spasms of the face.  Spasms of the voice box.  Tremors.  Awkward and painful positions.  Muscle cramping after muscle use. DIAGNOSIS  Your health care provider can diagnose dystonia based on your symptoms and medical history. Your health care provider will also do a physical exam. You may also have:   A blood test to check for genes that cause dystonia.  Brain imaging tests to rule out other causes of your symptoms. There are no tests that can diagnose other causes of dystonia. TREATMENT  There are no treatments that can cure or prevent dystonia. Treatment to manage dystonia may include:   Injecting  the affected muscles with a chemical (botulinum) that blocks muscle spasms. This treatment can block spasms for a few days to a few months.  Medicines to relax muscles. HOME CARE INSTRUCTIONS  Physical therapy to improve muscle strength and movement may be suggested by your health care provider. Continue your physical therapy exercises at home as instructed by your physical therapist.  Make sure you have a good support system. Let your health care provider know if you are struggling with stress or anxiety.  Keep all follow-up visits as directed by your health care provider. This is important.  Take medicines only as directed by your health care provider. SEEK MEDICAL CARE IF:  Your condition is changing or getting worse.  You need more support at home.   This information is not intended to replace advice given to you by your health care provider. Make sure you discuss any questions you have with your health care provider.   Document Released: 05/04/2002 Document Revised: 06/04/2014 Document Reviewed: 07/08/2013 Elsevier Interactive Patient Education [employer].

## 2015-05-26 ENCOUNTER — Encounter (HOSPITAL_BASED_OUTPATIENT_CLINIC_OR_DEPARTMENT_OTHER): Payer: Self-pay | Admitting: *Deleted

## 2015-05-26 ENCOUNTER — Emergency Department (HOSPITAL_BASED_OUTPATIENT_CLINIC_OR_DEPARTMENT_OTHER)
Admission: EM | Admit: 2015-05-26 | Discharge: 2015-05-26 | Disposition: A | Discharge status: Home / Self Care | Payer: 59 | Attending: Emergency Medicine | Admitting: Emergency Medicine

## 2015-05-26 DIAGNOSIS — E876 Hypokalemia: Secondary | ICD-10-CM

## 2015-05-26 DIAGNOSIS — Z79899 Other long term (current) drug therapy: Secondary | ICD-10-CM | POA: Insufficient documentation

## 2015-05-26 DIAGNOSIS — Z9089 Acquired absence of other organs: Secondary | ICD-10-CM | POA: Insufficient documentation

## 2015-05-26 DIAGNOSIS — Z8719 Personal history of other diseases of the digestive system: Secondary | ICD-10-CM | POA: Insufficient documentation

## 2015-05-26 DIAGNOSIS — Z87442 Personal history of urinary calculi: Secondary | ICD-10-CM | POA: Insufficient documentation

## 2015-05-26 DIAGNOSIS — F17210 Nicotine dependence, cigarettes, uncomplicated: Secondary | ICD-10-CM | POA: Insufficient documentation

## 2015-05-26 DIAGNOSIS — R000 Tachycardia, unspecified: Secondary | ICD-10-CM | POA: Insufficient documentation

## 2015-05-26 DIAGNOSIS — N390 Urinary tract infection, site not specified: Secondary | ICD-10-CM | POA: Insufficient documentation

## 2015-05-26 DIAGNOSIS — R1012 Left upper quadrant pain: Secondary | ICD-10-CM | POA: Diagnosis present

## 2015-05-26 DIAGNOSIS — Z9049 Acquired absence of other specified parts of digestive tract: Secondary | ICD-10-CM | POA: Insufficient documentation

## 2015-05-26 DIAGNOSIS — R1013 Epigastric pain: Secondary | ICD-10-CM

## 2015-05-26 LAB — COMPREHENSIVE METABOLIC PANEL
ALBUMIN: 4.4 g/dL (ref 3.5–5.0)
ALT: 20 U/L (ref 17–63)
AST: 32 U/L (ref 15–41)
Alkaline Phosphatase: 65 U/L (ref 38–126)
Anion gap: 9 (ref 5–15)
BUN: 20 mg/dL (ref 6–20)
CHLORIDE: 108 mmol/L (ref 101–111)
CO2: 22 mmol/L (ref 22–32)
CREATININE: 0.97 mg/dL (ref 0.61–1.24)
Calcium: 9.8 mg/dL (ref 8.9–10.3)
GFR calc Af Amer: >60 mL/min (ref >60)
GLUCOSE: 144 mg/dL — AB (ref 65–99)
POTASSIUM: 2.9 mmol/L — AB (ref 3.5–5.1)
Sodium: 139 mmol/L (ref 135–145)
Total Bilirubin: 0.7 mg/dL (ref 0.3–1.2)
Total Protein: 7.8 g/dL (ref 6.5–8.1)

## 2015-05-26 LAB — CBC WITH DIFFERENTIAL/PLATELET
Basophils Absolute: 0.1 10*3/uL (ref 0.0–0.1)
Basophils Relative: 0 %
EOS ABS: 0.3 10*3/uL (ref 0.0–0.7)
EOS PCT: 2 %
HCT: 46.6 % (ref 39.0–52.0)
Hemoglobin: 16.5 g/dL (ref 13.0–17.0)
LYMPHS ABS: 4.8 10*3/uL — AB (ref 0.7–4.0)
LYMPHS PCT: 38 %
MCH: 29.8 pg (ref 26.0–34.0)
MCHC: 35.4 g/dL (ref 30.0–36.0)
MCV: 84.1 fL (ref 78.0–100.0)
MONO ABS: 1 10*3/uL (ref 0.1–1.0)
MONOS PCT: 8 %
Neutro Abs: 6.4 10*3/uL (ref 1.7–7.7)
Neutrophils Relative %: 52 %
PLATELETS: 267 10*3/uL (ref 150–400)
RBC: 5.54 MIL/uL (ref 4.22–5.81)
RDW: 13.2 % (ref 11.5–15.5)
WBC: 12.4 10*3/uL — AB (ref 4.0–10.5)

## 2015-05-26 LAB — URINE MICROSCOPIC-ADD ON: RBC / HPF: NONE SEEN RBC/hpf (ref 0–5)

## 2015-05-26 LAB — URINALYSIS, ROUTINE W REFLEX MICROSCOPIC
GLUCOSE, UA: NEGATIVE mg/dL
Hgb urine dipstick: NEGATIVE
KETONES UR: 15 mg/dL — AB
NITRITE: NEGATIVE
PROTEIN: 30 mg/dL — AB
Specific Gravity, Urine: 1.025 (ref 1.005–1.030)
pH: 5.5 (ref 5.0–8.0)

## 2015-05-26 LAB — LIPASE, BLOOD: LIPASE: 26 U/L (ref 11–51)

## 2015-05-26 MED ORDER — CEFTRIAXONE SODIUM 1 G IJ SOLR
INTRAMUSCULAR | Status: AC
  Filled 2015-05-26: qty 10

## 2015-05-26 MED ORDER — CEFTRIAXONE SODIUM 1 G IJ SOLR
1.0000 g | Freq: Once | INTRAMUSCULAR | Status: AC
  Administered 2015-05-26: 1 g via INTRAVENOUS

## 2015-05-26 MED ORDER — RANITIDINE HCL 150 MG PO TABS: 150.0000 mg | ORAL_TABLET | Freq: Two times a day (BID) | ORAL | Status: DC

## 2015-05-26 MED ORDER — PROMETHAZINE HCL 25 MG PO TABS
25.0000 mg | ORAL_TABLET | Freq: Four times a day (QID) | ORAL | Status: DC | PRN
Start: 2015-05-26 — End: 2017-09-10

## 2015-05-26 MED ORDER — HYDROMORPHONE HCL 1 MG/ML IJ SOLN
1.0000 mg | Freq: Once | INTRAMUSCULAR | Status: AC
Start: 2015-05-26 — End: 2015-05-26
  Administered 2015-05-26: 1 mg via INTRAVENOUS
  Filled 2015-05-26: qty 1

## 2015-05-26 MED ORDER — HYDROMORPHONE HCL 1 MG/ML IJ SOLN
1.0000 mg | Freq: Once | INTRAMUSCULAR | Status: AC
  Administered 2015-05-26: 1 mg via INTRAVENOUS
  Filled 2015-05-26: qty 1

## 2015-05-26 MED ORDER — POTASSIUM CHLORIDE ER 10 MEQ PO TBCR: 10.0000 meq | EXTENDED_RELEASE_TABLET | Freq: Every day | ORAL | Status: DC

## 2015-05-26 MED ORDER — SODIUM CHLORIDE 0.9 % IV BOLUS (SEPSIS)
1000.0000 mL | Freq: Once | INTRAVENOUS | Status: AC
  Administered 2015-05-26: 1000 mL via INTRAVENOUS

## 2015-05-26 MED ORDER — ONDANSETRON HCL 4 MG/2ML IJ SOLN
4.0000 mg | Freq: Once | INTRAMUSCULAR | Status: AC
  Administered 2015-05-26: 4 mg via INTRAVENOUS
  Filled 2015-05-26: qty 2

## 2015-05-26 MED ORDER — CEPHALEXIN 500 MG PO CAPS: 500.0000 mg | ORAL_CAPSULE | Freq: Two times a day (BID) | ORAL | Status: DC

## 2015-05-26 MED ORDER — POTASSIUM CHLORIDE CRYS ER 20 MEQ PO TBCR
20.0000 meq | EXTENDED_RELEASE_TABLET | Freq: Once | ORAL | Status: AC
  Administered 2015-05-26: 20 meq via ORAL
  Filled 2015-05-26: qty 1

## 2015-05-26 NOTE — Discharge Instructions (Signed)

## 2015-05-26 NOTE — ED Notes (Signed)
Pt alert, NAD, calm, interactive, resps e/u, speaking in clear complete sentences, tolerating sips of fluids. States, "feel better".

## 2015-05-26 NOTE — ED Notes (Signed)
Left upper quadrant abdominal pain x 2 hours. Thinks he is having pancreatitis attack.

## 2015-05-26 NOTE — ED Provider Notes (Signed)
CSN: [id number]     Arrival date & time 05/26/15  1658 History   First MD Initiated Contact with Patient 05/26/15 1709     Chief Complaint  Patient presents with  . Abdominal Pain     Patient is a 39 y.o. male presenting with abdominal pain. The history is provided by the patient. No language interpreter was used.  Abdominal Pain   Brexten Calero is a 39 y.o. male who presents to the Emergency Department complaining of abdominal pain.  He reports severe LUQ abdominal pain that started about 3 hours prior to arrival.  The pain is constant in nature and radiates to his back.  He has associated nausea and vomiting.  Last BM was yesterday and normal.  No fevers.  He reports feeling dehydrated over the last few days with increased thirst and urination.  Sxs feel similar to his prior pancreatitis.    Past Medical History  Diagnosis Date  . Pancreatitis   . Kidney stones    Past Surgical History  Procedure Laterality Date  . Appendectomy    . Cholecystectomy    . Kidney stone surgery    . Ercp w/ metal stent placement     Family History  Problem Relation Age of Onset  . Cancer Mother   . Heart failure Father    Social History  Substance Use Topics  . Smoking status: Current Every Day Smoker -- 0.50 packs/day    Types: Cigarettes  . Smokeless tobacco: Never Used  . Alcohol Use: Yes     Comment: rarely, pt stated that he drinks once ever 3 months and it is usually a beer at dinner    Review of Systems  Gastrointestinal: Positive for abdominal pain.  All other systems reviewed and are negative.     Allergies  Cortisone; Eggs or egg-derived products; Iodinated diagnostic agents; Ketorolac tromethamine; and Haldol  Home Medications   Prior to Admission medications   Medication Sig Start Date End Date Taking? Authorizing Provider  cephALEXin (KEFLEX) 500 MG capsule Take 1 capsule (500 mg total) by mouth 2 (two) times daily. 05/26/15   Quintella Reichert, MD  clonazePAM (KLONOPIN)  0.5 MG tablet Take 0.5 mg by mouth 2 (two) times daily.  12/16/14   Historical Provider, MD  cyclobenzaprine (FLEXERIL) 10 MG tablet Take 1 tablet (10 mg total) by mouth 3 (three) times daily as needed (muscle soreness). 04/19/15   Rolland Porter, MD  fentaNYL (DURAGESIC - DOSED MCG/HR) 50 MCG/HR Place 50 mcg onto the skin every 3 (three) days.    Historical Provider, MD  potassium chloride (K-DUR) 10 MEQ tablet Take 1 tablet (10 mEq total) by mouth daily. 05/26/15   Quintella Reichert, MD  promethazine (PHENERGAN) 25 MG tablet Take 1 tablet (25 mg total) by mouth every 6 (six) hours as needed for nausea or vomiting. 05/26/15   Quintella Reichert, MD  ranitidine (ZANTAC) 150 MG tablet Take 1 tablet (150 mg total) by mouth 2 (two) times daily. 05/26/15   Quintella Reichert, MD  Tapentadol HCl 75 MG TABS Take 75 mg by mouth every 6 (six) hours as needed (pain).     Historical Provider, MD   BP 133/88 mmHg  Pulse 89  Temp(Src) 98.1 F (36.7 C) (Oral)  Resp 16  Ht 6\' 1"  (1.854 m)  Wt 205 lb (92.987 kg)  BMI 27.05 kg/m2  SpO2 95% Physical Exam  Constitutional: He is oriented to person, place, and time. He appears well-developed and well-nourished.  HENT:  Head: Normocephalic and atraumatic.  Cardiovascular: Regular rhythm.   No murmur heard. tachycardic  Pulmonary/Chest: Effort normal and breath sounds normal. No respiratory distress.  Abdominal: Soft. Bowel sounds are normal. There is no rebound and no guarding.  Moderate upper abdominal tenderness  Musculoskeletal: He exhibits no edema or tenderness.  Neurological: He is alert and oriented to person, place, and time.  Skin: Skin is warm and dry.  Psychiatric: He has a normal mood and affect. His behavior is normal.  Nursing note and vitals reviewed.   ED Course  Procedures (including critical care time) Labs Review Labs Reviewed  URINALYSIS, ROUTINE W REFLEX MICROSCOPIC (NOT AT [facility]) - Abnormal; Notable for the following:    Color, Urine AMBER (*)     APPearance CLOUDY (*)    Bilirubin Urine SMALL (*)    Ketones, ur 15 (*)    Protein, ur 30 (*)    Leukocytes, UA SMALL (*)    All other components within normal limits  COMPREHENSIVE METABOLIC PANEL - Abnormal; Notable for the following:    Potassium 2.9 (*)    Glucose, Bld 144 (*)    All other components within normal limits  CBC WITH DIFFERENTIAL/PLATELET - Abnormal; Notable for the following:    WBC 12.4 (*)    Lymphs Abs 4.8 (*)    All other components within normal limits  URINE MICROSCOPIC-ADD ON - Abnormal; Notable for the following:    Squamous Epithelial / LPF 0-5 (*)    Bacteria, UA MANY (*)    Casts HYALINE CASTS (*)    Crystals CA OXALATE CRYSTALS (*)    All other components within normal limits  URINE CULTURE  LIPASE, BLOOD    Imaging Review No results found. I have personally reviewed and evaluated these images and lab results as part of my medical decision-making.   EKG Interpretation None      MDM   Final diagnoses:  Abdominal pain, acute, epigastric  Hypokalemia  Acute UTI    Patient with history of pancreatitis here for evaluation of abdominal pain, vomiting. He is mildly dehydrated appearing on examination, this is improved after IV fluids. His pain is significantly improved in the emergency department and he is tolerating oral fluids without any difficulty. Urine is concerning for UTI although patient has no symptoms of dysuria, will treat with antibiotics in setting of vomiting. Discussed with patient's home care for epigastric pain, UTI, outpatient follow-up, return impressions.  Quintella Reichert, MD 05/27/15 [phone]

## 2015-05-28 ENCOUNTER — Emergency Department (HOSPITAL_COMMUNITY)
Admission: EM | Admit: 2015-05-28 | Discharge: 2015-05-28 | Disposition: A | Discharge status: Home / Self Care | Payer: 59 | Attending: Emergency Medicine | Admitting: Emergency Medicine

## 2015-05-28 DIAGNOSIS — F17210 Nicotine dependence, cigarettes, uncomplicated: Secondary | ICD-10-CM | POA: Insufficient documentation

## 2015-05-28 DIAGNOSIS — Z87442 Personal history of urinary calculi: Secondary | ICD-10-CM | POA: Insufficient documentation

## 2015-05-28 DIAGNOSIS — R630 Anorexia: Secondary | ICD-10-CM | POA: Insufficient documentation

## 2015-05-28 DIAGNOSIS — Z8719 Personal history of other diseases of the digestive system: Secondary | ICD-10-CM | POA: Diagnosis not present

## 2015-05-28 DIAGNOSIS — R1012 Left upper quadrant pain: Secondary | ICD-10-CM | POA: Diagnosis present

## 2015-05-28 DIAGNOSIS — Z79899 Other long term (current) drug therapy: Secondary | ICD-10-CM | POA: Diagnosis not present

## 2015-05-28 DIAGNOSIS — R109 Unspecified abdominal pain: Secondary | ICD-10-CM

## 2015-05-28 DIAGNOSIS — G8929 Other chronic pain: Secondary | ICD-10-CM

## 2015-05-28 DIAGNOSIS — Z792 Long term (current) use of antibiotics: Secondary | ICD-10-CM | POA: Diagnosis not present

## 2015-05-28 LAB — URINALYSIS, ROUTINE W REFLEX MICROSCOPIC
BILIRUBIN URINE: NEGATIVE
Glucose, UA: NEGATIVE mg/dL
Hgb urine dipstick: NEGATIVE
Ketones, ur: NEGATIVE mg/dL
Leukocytes, UA: NEGATIVE
NITRITE: NEGATIVE
PH: 5.5 (ref 5.0–8.0)
Protein, ur: NEGATIVE mg/dL
SPECIFIC GRAVITY, URINE: 1.021 (ref 1.005–1.030)

## 2015-05-28 LAB — COMPREHENSIVE METABOLIC PANEL
ALK PHOS: 63 U/L (ref 38–126)
ALT: 17 U/L (ref 17–63)
ANION GAP: 11 (ref 5–15)
AST: 20 U/L (ref 15–41)
Albumin: 5 g/dL (ref 3.5–5.0)
BILIRUBIN TOTAL: 0.7 mg/dL (ref 0.3–1.2)
BUN: 15 mg/dL (ref 6–20)
CALCIUM: 10 mg/dL (ref 8.9–10.3)
CO2: 26 mmol/L (ref 22–32)
CREATININE: 0.77 mg/dL (ref 0.61–1.24)
Chloride: 105 mmol/L (ref 101–111)
Glucose, Bld: 112 mg/dL — ABNORMAL HIGH (ref 65–99)
Potassium: 3.6 mmol/L (ref 3.5–5.1)
SODIUM: 142 mmol/L (ref 135–145)
TOTAL PROTEIN: 8.1 g/dL (ref 6.5–8.1)

## 2015-05-28 LAB — URINE CULTURE

## 2015-05-28 LAB — CBC
HEMATOCRIT: 43.4 % (ref 39.0–52.0)
HEMOGLOBIN: 15.3 g/dL (ref 13.0–17.0)
MCH: 29.9 pg (ref 26.0–34.0)
MCHC: 35.3 g/dL (ref 30.0–36.0)
MCV: 84.8 fL (ref 78.0–100.0)
Platelets: 226 10*3/uL (ref 150–400)
RBC: 5.12 MIL/uL (ref 4.22–5.81)
RDW: 12.9 % (ref 11.5–15.5)
WBC: 9 10*3/uL (ref 4.0–10.5)

## 2015-05-28 LAB — LIPASE, BLOOD: Lipase: 29 U/L (ref 11–51)

## 2015-05-28 MED ORDER — ONDANSETRON HCL 4 MG/2ML IJ SOLN
4.0000 mg | Freq: Once | INTRAMUSCULAR | Status: AC
  Administered 2015-05-28: 4 mg via INTRAVENOUS
  Filled 2015-05-28: qty 2

## 2015-05-28 MED ORDER — HYDROMORPHONE HCL 1 MG/ML IJ SOLN
1.0000 mg | Freq: Once | INTRAMUSCULAR | Status: AC
  Administered 2015-05-28: 1 mg via INTRAVENOUS
  Filled 2015-05-28: qty 1

## 2015-05-28 NOTE — ED Notes (Signed)
Pt was recently [facility] and was treated with IV antibiotics for an elevated WBC; pt was sent home and advised to do clear liquid diet; pt presents because sx have worsened

## 2015-05-28 NOTE — ED Notes (Signed)
Pt c/o left lower quadrant abdominal pain since Thursday and states he has been on a liquid diet x 2 days.

## 2015-05-28 NOTE — ED Provider Notes (Signed)
CSN: [id number]     Arrival date & time 05/28/15  0440 History   First MD Initiated Contact with Patient 05/28/15 0701     Chief Complaint  Patient presents with  . Abdominal Pain   Patient is a 39 y.o. male presenting with abdominal pain. The history is provided by the patient.  Abdominal Pain Pain location:  LUQ Pain quality: sharp   Pain radiates to:  Back Pain severity:  Severe Onset quality:  Gradual Duration:  1 day Timing:  Constant Relieved by:  Nothing Worsened by:  Nothing tried Ineffective treatments: tried his chronic pain meds , nucenta. Associated symptoms: anorexia   Associated symptoms: no cough, no diarrhea, no fever and no vomiting   Risk factors comment:  Chronic abdominal pain, chronic pancreatitiis  patient has a history of chronic recurrent abdominal pain. He has been seen in the emergency room several times in the past few months for similar symptoms. He was seen in the emergency room a couple days ago at [facility]. He was noted to have an elevated white blood cell count. He was told to do a liquid diet. he states he was feeling well until the symptoms started up again today.  Past Medical History  Diagnosis Date  . Pancreatitis   . Kidney stones    Past Surgical History  Procedure Laterality Date  . Appendectomy    . Cholecystectomy    . Kidney stone surgery    . Ercp w/ metal stent placement     Family History  Problem Relation Age of Onset  . Cancer Mother   . Heart failure Father    Social History  Substance Use Topics  . Smoking status: Current Every Day Smoker -- 0.50 packs/day    Types: Cigarettes  . Smokeless tobacco: Never Used  . Alcohol Use: Yes     Comment: rarely, pt stated that he drinks once ever 3 months and it is usually a beer at dinner    Review of Systems  Constitutional: Negative for fever.  Respiratory: Negative for cough.   Gastrointestinal: Positive for abdominal pain and anorexia. Negative for vomiting and  diarrhea.      Allergies  Cortisone; Eggs or egg-derived products; Iodinated diagnostic agents; Ketorolac tromethamine; and Haldol  Home Medications   Prior to Admission medications   Medication Sig Start Date End Date Taking? Authorizing Provider  cephALEXin (KEFLEX) 500 MG capsule Take 1 capsule (500 mg total) by mouth 2 (two) times daily. 05/26/15  Yes Quintella Reichert, MD  cyclobenzaprine (FLEXERIL) 10 MG tablet Take 1 tablet (10 mg total) by mouth 3 (three) times daily as needed (muscle soreness). 04/19/15  Yes Rolland Porter, MD  fentaNYL (DURAGESIC - DOSED MCG/HR) 50 MCG/HR Place 50 mcg onto the skin every 3 (three) days.   Yes Historical Provider, MD  potassium chloride (K-DUR) 10 MEQ tablet Take 1 tablet (10 mEq total) by mouth daily. 05/26/15  Yes Quintella Reichert, MD  promethazine (PHENERGAN) 25 MG tablet Take 1 tablet (25 mg total) by mouth every 6 (six) hours as needed for nausea or vomiting. 05/26/15  Yes Quintella Reichert, MD  ranitidine (ZANTAC) 150 MG tablet Take 1 tablet (150 mg total) by mouth 2 (two) times daily. 05/26/15  Yes Quintella Reichert, MD  Tapentadol HCl 75 MG TABS Take 75 mg by mouth every 6 (six) hours as needed (pain).    Yes Historical Provider, MD   BP 121/92 mmHg  Pulse 85  Temp(Src) 97.8 F (36.6 C) (  Oral)  Resp 20  Ht 6\' 1"  (1.854 m)  Wt 92.987 kg  BMI 27.05 kg/m2  SpO2 94% Physical Exam  Constitutional: He appears well-developed and well-nourished. No distress.  HENT:  Head: Normocephalic and atraumatic.  Right Ear: External ear normal.  Left Ear: External ear normal.  Eyes: Conjunctivae are normal. Right eye exhibits no discharge. Left eye exhibits no discharge. No scleral icterus.  Neck: Neck supple. No tracheal deviation present.  Cardiovascular: Normal rate, regular rhythm and intact distal pulses.   Pulmonary/Chest: Effort normal and breath sounds normal. No stridor. No respiratory distress. He has no wheezes. He has no rales.  Abdominal: Soft.  Bowel sounds are normal. He exhibits no distension. There is tenderness in the left upper quadrant. There is no rebound and no guarding. No hernia.  Musculoskeletal: He exhibits no edema or tenderness.  Neurological: He is alert. He has normal strength. No cranial nerve deficit (no facial droop, extraocular movements intact, no slurred speech) or sensory deficit. He exhibits normal muscle tone. He displays no seizure activity. Coordination normal.  Skin: Skin is warm and dry. No rash noted.  Psychiatric: He has a normal mood and affect.  Nursing note and vitals reviewed.   ED Course  Procedures (including critical care time) Labs Review Labs Reviewed  COMPREHENSIVE METABOLIC PANEL - Abnormal; Notable for the following:    Glucose, Bld 112 (*)    All other components within normal limits  LIPASE, BLOOD  CBC  URINALYSIS, ROUTINE W REFLEX MICROSCOPIC (NOT AT [facility])    Medications  HYDROmorphone (DILAUDID) injection 1 mg (not administered)  ondansetron (ZOFRAN) injection 4 mg (4 mg Intravenous Given 05/28/15 0733)  HYDROmorphone (DILAUDID) injection 1 mg (1 mg Intravenous Given 05/28/15 0734)     MDM   Final diagnoses:  Chronic abdominal pain    Patient has a history of chronic, recurrent abdominal pain. The patient presented to the emergency room with recurrent symptoms. I have reviewed his records. He is followed at a pain management clinic for the symptoms. He recently had a nerve block to try to help him with the recurrent exacerbations.  Patient has not had any vomiting. His laboratory tests are reassuring. Patient was given pain medications and antinausea medications. He is feeling better. Plan is for him to follow up with his pain management doctor this coming week. Continue his current medication regimen.   Dorie Rank, MD 05/28/15 0900

## 2015-05-28 NOTE — Discharge Instructions (Signed)

## 2015-06-01 ENCOUNTER — Telehealth: Payer: Self-pay

## 2015-06-01 NOTE — Telephone Encounter (Signed)
Left patient a message to call back to schedule a follow up apt

## 2015-06-03 ENCOUNTER — Encounter: Payer: Self-pay | Admitting: Family Medicine

## 2015-06-03 ENCOUNTER — Ambulatory Visit (INDEPENDENT_AMBULATORY_CARE_PROVIDER_SITE_OTHER): Payer: 59 | Admitting: Family Medicine

## 2015-06-03 VITALS — BP 167/104 | HR 103 | Temp 98.8°F | Ht 73.0 in | Wt 194.8 lb

## 2015-06-03 DIAGNOSIS — K861 Other chronic pancreatitis: Secondary | ICD-10-CM | POA: Diagnosis not present

## 2015-06-03 NOTE — Addendum Note (Signed)
Addended by: Jamelle Haring on: 06/03/2015 11:06 AM   Modules accepted: Orders

## 2015-06-03 NOTE — Progress Notes (Signed)
   Subjective:    Patient ID: [name], male    DOB: Jun 21, 1975, 40 y.o.   MRN: [id number]  HPI 40 year old gentleman who is here for the first time. He was formally a patient, family practice in [place] but when his doctor there retired he has received care at the emergency room. He also sees a doctor in [place] at pain clinic for chronic pancreatitis. Medicines include Nucynta and fentanyl as well as Phenergan for nausea or vomiting. He seems to be having a fair amount of abdominal pain. The pain has occurred as a result of biliary disease. He is status post cholecystectomy and stenting of the bile duct. He would like to see a gastroenterologist to follow-up that problem. Today in the office his blood pressure is elevated. He feels this is related to his pain and having not had his medicine this morning but we will follow this and see if it really is a problem to be dealt with.  There are no active problems to display for this patient.  Outpatient Encounter Prescriptions as of 06/03/2015  Medication Sig  . cephALEXin (KEFLEX) 500 MG capsule Take 1 capsule (500 mg total) by mouth 2 (two) times daily.  . fentaNYL (DURAGESIC - DOSED MCG/HR) 50 MCG/HR Place 50 mcg onto the skin every 3 (three) days.  . potassium chloride (K-DUR) 10 MEQ tablet Take 1 tablet (10 mEq total) by mouth daily.  . promethazine (PHENERGAN) 25 MG tablet Take 1 tablet (25 mg total) by mouth every 6 (six) hours as needed for nausea or vomiting.  . Tapentadol HCl 75 MG TABS Take 75 mg by mouth every 6 (six) hours as needed (pain).   . [DISCONTINUED] cyclobenzaprine (FLEXERIL) 10 MG tablet Take 1 tablet (10 mg total) by mouth 3 (three) times daily as needed (muscle soreness).  . [DISCONTINUED] ranitidine (ZANTAC) 150 MG tablet Take 1 tablet (150 mg total) by mouth 2 (two) times daily.   No facility-administered encounter medications on file as of 06/03/2015.      Review of Systems  Constitutional: Negative.   Respiratory:  Negative.   Cardiovascular: Negative.   Gastrointestinal: Positive for nausea and abdominal pain.  Musculoskeletal: Negative.   Allergic/Immunologic: Positive for environmental allergies.  Psychiatric/Behavioral: Negative.        Objective:   Physical Exam  Constitutional: He is oriented to person, place, and time. He appears well-developed and well-nourished.  Cardiovascular: Normal rate, regular rhythm and normal heart sounds.   Pulmonary/Chest: Effort normal and breath sounds normal.  Abdominal: Soft. Bowel sounds are normal. There is no tenderness.  Neurological: He is alert and oriented to person, place, and time.          Assessment & Plan:  1. Chronic biliary pancreatitis (HCC) GI referral. Will check lipids and amylase today to be sure the triglycerides are not playing a role in his chronic pancreatitis. I have asked him to check his blood pressure at home and return in 1 or 2 months if pressures are consistently greater than 130/80 - Lipid panel - Amylase

## 2015-06-04 LAB — LIPID PANEL
CHOLESTEROL TOTAL: 207 mg/dL — AB (ref 100–199)
Chol/HDL Ratio: 5.2 ratio units — ABNORMAL HIGH (ref 0.0–5.0)
HDL: 40 mg/dL (ref >39)
LDL CALC: 113 mg/dL — AB (ref 0–99)
Triglycerides: 270 mg/dL — ABNORMAL HIGH (ref 0–149)
VLDL CHOLESTEROL CAL: 54 mg/dL — AB (ref 5–40)

## 2015-06-04 LAB — AMYLASE: Amylase: 83 U/L (ref 31–124)

## 2015-06-08 ENCOUNTER — Telehealth: Payer: Self-pay | Admitting: Family Medicine

## 2015-06-08 NOTE — Telephone Encounter (Signed)
Aware of all lab results. 

## 2015-06-09 ENCOUNTER — Encounter: Payer: Self-pay | Admitting: Gastroenterology

## 2015-06-28 ENCOUNTER — Telehealth: Payer: Self-pay | Admitting: Nurse Practitioner

## 2015-06-28 ENCOUNTER — Ambulatory Visit: Payer: 59 | Admitting: Nurse Practitioner

## 2015-06-28 ENCOUNTER — Encounter: Payer: Self-pay | Admitting: Nurse Practitioner

## 2015-06-28 NOTE — Telephone Encounter (Signed)
PATENT WAS A NO SHOW AND LETTER SENT

## 2015-06-28 NOTE — Telephone Encounter (Signed)
Noted  

## 2015-07-01 ENCOUNTER — Ambulatory Visit: Payer: 59 | Admitting: Family Medicine

## 2015-07-04 ENCOUNTER — Encounter: Payer: Self-pay | Admitting: Family Medicine

## 2015-07-12 ENCOUNTER — Ambulatory Visit: Payer: 59 | Admitting: Family Medicine

## 2015-07-13 ENCOUNTER — Encounter: Payer: Self-pay | Admitting: Family Medicine

## 2015-08-03 ENCOUNTER — Encounter (HOSPITAL_BASED_OUTPATIENT_CLINIC_OR_DEPARTMENT_OTHER): Payer: Self-pay | Admitting: Emergency Medicine

## 2015-08-03 ENCOUNTER — Emergency Department (HOSPITAL_BASED_OUTPATIENT_CLINIC_OR_DEPARTMENT_OTHER)
Admission: EM | Admit: 2015-08-03 | Discharge: 2015-08-03 | Disposition: A | Discharge status: Home / Self Care | Payer: 59 | Attending: Emergency Medicine | Admitting: Emergency Medicine

## 2015-08-03 DIAGNOSIS — F17210 Nicotine dependence, cigarettes, uncomplicated: Secondary | ICD-10-CM | POA: Insufficient documentation

## 2015-08-03 DIAGNOSIS — R109 Unspecified abdominal pain: Secondary | ICD-10-CM | POA: Insufficient documentation

## 2015-08-03 DIAGNOSIS — Z8719 Personal history of other diseases of the digestive system: Secondary | ICD-10-CM | POA: Insufficient documentation

## 2015-08-03 DIAGNOSIS — Z87442 Personal history of urinary calculi: Secondary | ICD-10-CM | POA: Insufficient documentation

## 2015-08-03 DIAGNOSIS — Z792 Long term (current) use of antibiotics: Secondary | ICD-10-CM | POA: Insufficient documentation

## 2015-08-03 DIAGNOSIS — G8929 Other chronic pain: Secondary | ICD-10-CM

## 2015-08-03 LAB — COMPREHENSIVE METABOLIC PANEL
ALBUMIN: 4.3 g/dL (ref 3.5–5.0)
ALT: 24 U/L (ref 17–63)
AST: 29 U/L (ref 15–41)
Alkaline Phosphatase: 58 U/L (ref 38–126)
Anion gap: 11 (ref 5–15)
BUN: 14 mg/dL (ref 6–20)
CHLORIDE: 106 mmol/L (ref 101–111)
CO2: 21 mmol/L — AB (ref 22–32)
CREATININE: 0.77 mg/dL (ref 0.61–1.24)
Calcium: 9.2 mg/dL (ref 8.9–10.3)
GFR calc Af Amer: >60 mL/min (ref >60)
GLUCOSE: 162 mg/dL — AB (ref 65–99)
POTASSIUM: 3 mmol/L — AB (ref 3.5–5.1)
Sodium: 138 mmol/L (ref 135–145)
Total Bilirubin: 0.7 mg/dL (ref 0.3–1.2)
Total Protein: 6.9 g/dL (ref 6.5–8.1)

## 2015-08-03 LAB — CBC WITH DIFFERENTIAL/PLATELET
Basophils Absolute: 0 10*3/uL (ref 0.0–0.1)
Basophils Relative: 0 %
EOS ABS: 0.2 10*3/uL (ref 0.0–0.7)
Eosinophils Relative: 2 %
HEMATOCRIT: 40.3 % (ref 39.0–52.0)
Hemoglobin: 14.7 g/dL (ref 13.0–17.0)
LYMPHS ABS: 3 10*3/uL (ref 0.7–4.0)
Lymphocytes Relative: 25 %
MCH: 30.6 pg (ref 26.0–34.0)
MCHC: 36.5 g/dL — ABNORMAL HIGH (ref 30.0–36.0)
MCV: 83.8 fL (ref 78.0–100.0)
MONOS PCT: 10 %
Monocytes Absolute: 1.1 10*3/uL — ABNORMAL HIGH (ref 0.1–1.0)
NEUTROS ABS: 7.5 10*3/uL (ref 1.7–7.7)
NEUTROS PCT: 63 %
PLATELETS: 229 10*3/uL (ref 150–400)
RBC: 4.81 MIL/uL (ref 4.22–5.81)
RDW: 12.6 % (ref 11.5–15.5)
WBC: 11.9 10*3/uL — ABNORMAL HIGH (ref 4.0–10.5)

## 2015-08-03 LAB — LIPASE, BLOOD: Lipase: 39 U/L (ref 11–51)

## 2015-08-03 MED ORDER — HYDROMORPHONE HCL 1 MG/ML IJ SOLN
1.0000 mg | Freq: Once | INTRAMUSCULAR | Status: AC
  Administered 2015-08-03: 1 mg via INTRAVENOUS

## 2015-08-03 MED ORDER — ONDANSETRON HCL 4 MG/2ML IJ SOLN
4.0000 mg | Freq: Once | INTRAMUSCULAR | Status: AC
Start: 2015-08-03 — End: 2015-08-03
  Administered 2015-08-03: 4 mg via INTRAVENOUS

## 2015-08-03 MED ORDER — PROMETHAZINE HCL 25 MG/ML IJ SOLN
12.5000 mg | Freq: Once | INTRAMUSCULAR | Status: AC
  Administered 2015-08-03: 12.5 mg via INTRAVENOUS
  Filled 2015-08-03: qty 1

## 2015-08-03 MED ORDER — DICYCLOMINE HCL 10 MG/ML IM SOLN
20.0000 mg | Freq: Once | INTRAMUSCULAR | Status: AC
  Administered 2015-08-03: 20 mg via INTRAMUSCULAR

## 2015-08-03 MED ORDER — FENTANYL CITRATE (PF) 100 MCG/2ML IJ SOLN
100.0000 ug | Freq: Once | INTRAMUSCULAR | Status: AC
  Administered 2015-08-03: 100 ug via INTRAVENOUS
  Filled 2015-08-03: qty 2

## 2015-08-03 NOTE — ED Provider Notes (Signed)
CSN: [id number]     Arrival date & time 08/03/15  0232 History   First MD Initiated Contact with Patient 08/03/15 0249     Chief Complaint  Patient presents with  . Emesis     (Consider location/radiation/quality/duration/timing/severity/associated sxs/prior Treatment) Patient is a 40 y.o. male presenting with vomiting. The history is provided by the patient.  Emesis Severity:  Mild Timing:  Rare Quality:  Stomach contents Progression:  Unchanged Chronicity:  Recurrent Recent urination:  Normal Relieved by:  Nothing Worsened by:  Nothing tried Ineffective treatments:  None tried Associated symptoms: abdominal pain   Abdominal pain:    Location:  LUQ   Quality:  Aching   Severity:  Severe   Onset quality:  Sudden   Timing:  Constant   Progression:  Unchanged   Chronicity:  Chronic Risk factors: no alcohol use     Past Medical History  Diagnosis Date  . Pancreatitis   . Kidney stones   . Pancreatitis, chronic ([place]) 05/28/2005  . Kidney stones    Past Surgical History  Procedure Laterality Date  . Appendectomy    . Cholecystectomy    . Kidney stone surgery    . Ercp w/ metal stent placement     Family History  Problem Relation Age of Onset  . Cancer Mother   . Heart failure Father    Social History  Substance Use Topics  . Smoking status: Current Every Day Smoker -- 0.50 packs/day    Types: Cigarettes  . Smokeless tobacco: Never Used  . Alcohol Use: Yes     Comment: rarely, pt stated that he drinks once ever 3 months and it is usually a beer at dinner    Review of Systems  Gastrointestinal: Positive for vomiting and abdominal pain.  All other systems reviewed and are negative.     Allergies  Cortisone; Eggs or egg-derived products; Iodinated diagnostic agents; Ketorolac tromethamine; and Haldol  Home Medications   Prior to Admission medications   Medication Sig Start Date End Date Taking? Authorizing Provider  cephALEXin (KEFLEX) 500 MG capsule  Take 1 capsule (500 mg total) by mouth 2 (two) times daily. 05/26/15   Quintella Reichert, MD  fentaNYL (DURAGESIC - DOSED MCG/HR) 50 MCG/HR Place 50 mcg onto the skin every 3 (three) days.    Historical Provider, MD  potassium chloride (K-DUR) 10 MEQ tablet Take 1 tablet (10 mEq total) by mouth daily. 05/26/15   Quintella Reichert, MD  promethazine (PHENERGAN) 25 MG tablet Take 1 tablet (25 mg total) by mouth every 6 (six) hours as needed for nausea or vomiting. 05/26/15   Quintella Reichert, MD  Tapentadol HCl 75 MG TABS Take 75 mg by mouth every 6 (six) hours as needed (pain).     Historical Provider, MD   BP 169/111 mmHg  Pulse 95  Temp(Src) 98.4 F (36.9 C) (Oral)  Resp 16  Ht 6\' 1"  (1.854 m)  Wt 205 lb (92.987 kg)  BMI 27.05 kg/m2  SpO2 98% Physical Exam  Constitutional: He is oriented to person, place, and time. He appears well-developed and well-nourished. No distress.  HENT:  Head: Normocephalic and atraumatic.  Mouth/Throat: Oropharynx is clear and moist.  Eyes: Conjunctivae are normal. Pupils are equal, round, and reactive to light.  Neck: Normal range of motion. Neck supple.  Cardiovascular: Normal rate, regular rhythm and intact distal pulses.   Pulmonary/Chest: Effort normal and breath sounds normal. No respiratory distress. He has no wheezes. He has no rales.  Abdominal:  Soft. Bowel sounds are normal. There is no tenderness. There is no rebound and no guarding.  Musculoskeletal: Normal range of motion.  Neurological: He is alert and oriented to person, place, and time.  Skin: Skin is warm and dry.  Psychiatric: He has a normal mood and affect.    ED Course  Procedures (including critical care time) Labs Review Labs Reviewed  CBC WITH DIFFERENTIAL/PLATELET  COMPREHENSIVE METABOLIC PANEL  LIPASE, BLOOD    Imaging Review No results found. I have personally reviewed and evaluated these images and lab results as part of my medical decision-making.   EKG  Interpretation None      MDM   Final diagnoses:  None    Pain is chronic and patient is already on Nucynta and fentanyl patches.  Follow up with pain management to discuss treatment for your chronic pain    Latonyia Lopata, MD 08/03/15 [phone]

## 2015-08-03 NOTE — ED Notes (Signed)
MD at bedside. 

## 2015-08-03 NOTE — ED Notes (Signed)
Upper left abdominal pain and nausea since midnight.  Vomiting started upon arrival to Ed.

## 2015-08-03 NOTE — Discharge Instructions (Signed)
Authorized Agent Controlled Analgesia °Authorized agent controlled analgesia (AACA) is when a family member or someone who cares for an individual gives pain relieving medication (analgesia). The pain medication is given through a device called a patient controlled analgesia (PCA) pump. When a person cannot administer the analgesia through the PCA pump or does not understand when to give analgesia, another person is given this responsibility. They are referred to as an authorized agent. The authorized agent needs to be approved, trained, and responsible to administer a person's analgesia through a PCA pump. One authorized agent will be allowed to dose the patient during a specified time frame. Other family members or visitors should not administer analgesia. °AACA ANALGESIA DOSING °Together, you and the nurse will select a pain scale to use as well as a comfort goal. This may be a number scale or may be based on behaviors of your loved one. The goal of pain management is to make your loved one as comfortable as possible and to reduce pain without making them too sleepy. Reduced pain, not total relief, is a realistic plan. An analgesia dose should not be given if your loved one is sleeping or for reasons other than pain relief, unless directed otherwise.  °When your loved one has pain: °· Activate the delivery of an analgesia dose as instructed. °· Listen for an audible tone. This tone lets you know that the pump received the signal to give the analgesia. The analgesia is delivered through the attached intravenous (IV) tubing and into your loved one. °Doses of analgesia should be attempted anytime your loved one is in pain and awake. However, your loved one may not get a dose of analgesia every time you activate dose delivery. The pump is programmed to a specific time interval between doses. This safety feature attempts to prevent overdosing.  °Nurses will continue to monitor your loved one for pain relief, side  effects, and complications during AACA. Drowsiness, sedation, dizziness, nausea, vomiting, and constipation are some of the side effects of analgesia. Complications of analgesia include slowed breathing, tolerance, and dependence. °Use of analgesics may raise worries about possible dependence or addiction. The short-term use of analgesics will be managed carefully to make sure the chance of dependence is as low as possible. Most patients progress to oral pain relieving medications within a couple days, and the use of AACA is discontinued. °BENEFITS OF AACA  °· Manages pain effectively. °· Minimizes the delay in the patient getting the pain medication they need. °· Reduces number of injections for pain relief. °· Promotes shorter hospital stays. Patients with good pain control get better faster and usually leave the hospital earlier. °SEEK MEDICAL CARE IF: °· You have questions or concerns about AACA. °· There is swelling or leaking at the IV insertion site. °· The skin near the IV insertion site is cool to the touch. °· Your loved one has pain that does not get better with the analgesia. °· Your loved one is dizzy. °· Your loved one has nausea or is vomiting. °· Your loved one is constipated. °SEEK IMMEDIATE MEDICAL CARE IF: °· Your loved one, who was previously awake, cannot be aroused. °· Your loved one is breathing slowly. °· Your loved one is itching. °  °This information is not intended to replace advice given to you by your health care provider. Make sure you discuss any questions you have with your health care provider. °  °Document Released: 08/29/2010 Document Revised: 08/06/2011 Document Reviewed: 08/29/2010 °Elsevier Interactive Patient Education ©2016 [employer]   Inc[employer] ° °

## 2015-08-07 ENCOUNTER — Encounter (HOSPITAL_BASED_OUTPATIENT_CLINIC_OR_DEPARTMENT_OTHER): Payer: Self-pay | Admitting: Emergency Medicine

## 2015-08-07 ENCOUNTER — Emergency Department (HOSPITAL_BASED_OUTPATIENT_CLINIC_OR_DEPARTMENT_OTHER)
Admission: EM | Admit: 2015-08-07 | Discharge: 2015-08-08 | Disposition: A | Discharge status: Home / Self Care | Payer: 59 | Attending: Emergency Medicine | Admitting: Emergency Medicine

## 2015-08-07 DIAGNOSIS — Z8719 Personal history of other diseases of the digestive system: Secondary | ICD-10-CM | POA: Insufficient documentation

## 2015-08-07 DIAGNOSIS — R1013 Epigastric pain: Secondary | ICD-10-CM | POA: Insufficient documentation

## 2015-08-07 DIAGNOSIS — F17210 Nicotine dependence, cigarettes, uncomplicated: Secondary | ICD-10-CM | POA: Insufficient documentation

## 2015-08-07 DIAGNOSIS — R109 Unspecified abdominal pain: Secondary | ICD-10-CM

## 2015-08-07 DIAGNOSIS — G8929 Other chronic pain: Secondary | ICD-10-CM | POA: Insufficient documentation

## 2015-08-07 DIAGNOSIS — Z79899 Other long term (current) drug therapy: Secondary | ICD-10-CM | POA: Insufficient documentation

## 2015-08-07 DIAGNOSIS — R112 Nausea with vomiting, unspecified: Secondary | ICD-10-CM | POA: Insufficient documentation

## 2015-08-07 DIAGNOSIS — Z792 Long term (current) use of antibiotics: Secondary | ICD-10-CM | POA: Insufficient documentation

## 2015-08-07 DIAGNOSIS — Z87442 Personal history of urinary calculi: Secondary | ICD-10-CM | POA: Insufficient documentation

## 2015-08-07 DIAGNOSIS — Z9049 Acquired absence of other specified parts of digestive tract: Secondary | ICD-10-CM | POA: Insufficient documentation

## 2015-08-07 LAB — CBC
HCT: 43.6 % (ref 39.0–52.0)
Hemoglobin: 15.4 g/dL (ref 13.0–17.0)
MCH: 30.1 pg (ref 26.0–34.0)
MCHC: 35.3 g/dL (ref 30.0–36.0)
MCV: 85.3 fL (ref 78.0–100.0)
PLATELETS: 227 10*3/uL (ref 150–400)
RBC: 5.11 MIL/uL (ref 4.22–5.81)
RDW: 12.6 % (ref 11.5–15.5)
WBC: 10.4 10*3/uL (ref 4.0–10.5)

## 2015-08-07 MED ORDER — ALBUTEROL SULFATE HFA 108 (90 BASE) MCG/ACT IN AERS: 2.0000 | INHALATION_SPRAY | RESPIRATORY_TRACT | Status: DC | PRN

## 2015-08-07 MED ORDER — DOXYCYCLINE HYCLATE 100 MG PO TABS: 100.0000 mg | ORAL_TABLET | Freq: Once | ORAL | Status: DC

## 2015-08-07 NOTE — ED Notes (Signed)
Patient states that he feels like he may be having a pancreatitis flair up. He reports that he had an "attack" on tues. But it is not going away today.

## 2015-08-07 NOTE — ED Provider Notes (Signed)
CSN: [id number]     Arrival date & time 08/07/15  2057 History  By signing my name below, I, Helane Gunther, attest that this documentation has been prepared under the direction and in the presence of Shanon Rosser, MD. Electronically Signed: Helane Gunther, ED Scribe. 08/08/2015. 12:17 AM.     Chief Complaint  Patient presents with  . Abdominal Pain   The history is provided by the patient. No language interpreter was used.   HPI Comments: [name] is a 40 y.o. male who presents to the Emergency Department complaining of chronic, intermittent epigastric pain worsening and growing constant as of 12 hours ago. He reports a PMHx of pancreatitis, which he believes may be flaring up, though his lipase levels have rarely been elevated in the ED. He notes he is not on pancreatic enzymes for this. He reports associated nausea and 2 episodes of vomiting between 6 and 7 PM. He states he was able to take his pain medication around 8 PM and has not vomited it back up. He notes he is on Phenergan at home, which typically works, and has been given Zofran in the ED with effective relief as well. He has an appointment later today with his pain management physician, but was unable to tolerate the pain any longer. He is here for symptomatic relief and understands that he will not be receiving any prescriptions for pain medications. Pt denies fever and diarrhea.    Past Medical History  Diagnosis Date  . Pancreatitis   . Kidney stones   . Pancreatitis, chronic ([place]) 05/28/2005  . Kidney stones    Past Surgical History  Procedure Laterality Date  . Appendectomy    . Cholecystectomy    . Kidney stone surgery    . Ercp w/ metal stent placement     Family History  Problem Relation Age of Onset  . Cancer Mother   . Heart failure Father    Social History  Substance Use Topics  . Smoking status: Current Every Day Smoker -- 0.50 packs/day    Types: Cigarettes  . Smokeless tobacco: Never Used  . Alcohol  Use: Yes     Comment: rarely, pt stated that he drinks once ever 3 months and it is usually a beer at dinner    Review of Systems  All other systems reviewed and are negative.   Allergies  Cortisone; Eggs or egg-derived products; Iodinated diagnostic agents; Ketorolac tromethamine; and Haldol  Home Medications   Prior to Admission medications   Medication Sig Start Date End Date Taking? Authorizing Provider  cephALEXin (KEFLEX) 500 MG capsule Take 1 capsule (500 mg total) by mouth 2 (two) times daily. 05/26/15   Quintella Reichert, MD  fentaNYL (DURAGESIC - DOSED MCG/HR) 50 MCG/HR Place 50 mcg onto the skin every 3 (three) days.    Historical Provider, MD  potassium chloride (K-DUR) 10 MEQ tablet Take 1 tablet (10 mEq total) by mouth daily. 05/26/15   Quintella Reichert, MD  promethazine (PHENERGAN) 25 MG tablet Take 1 tablet (25 mg total) by mouth every 6 (six) hours as needed for nausea or vomiting. 05/26/15   Quintella Reichert, MD  Tapentadol HCl 75 MG TABS Take 75 mg by mouth every 6 (six) hours as needed (pain).     Historical Provider, MD   BP 144/101 mmHg  Pulse 73  Temp(Src) 98.8 F (37.1 C) (Oral)  Resp 18  Ht 6\' 1"  (1.854 m)  Wt 205 lb (92.987 kg)  BMI 27.05 kg/m2  SpO2 97%   Physical Exam General: Well-developed, well-nourished male in no acute distress; appearance consistent with age of record HENT: normocephalic; atraumatic Eyes: pupils equal, round and reactive to light; extraocular muscles intact Neck: supple Heart: regular rate and rhythm Lungs: clear to auscultation bilaterally Abdomen: soft; nondistended; epigastric TTP; no masses or hepatosplenomegaly; bowel sounds present Extremities: No deformity; full range of motion; pulses normal Neurologic: Awake, alert and oriented; motor function intact in all extremities and symmetric; no facial droop Skin: Warm and dry Psychiatric: Normal mood and affect  ED Course  Procedures   MDM   Nursing notes and vitals signs,  including pulse oximetry, reviewed.  Summary of this visit's results, reviewed by myself:  Labs:  Results for orders placed or performed during the hospital encounter of 08/07/15 (from the past 24 hour(s))  CBC     Status: None   Collection Time: 08/07/15 11:50 PM  Result Value Ref Range   WBC 10.4 4.0 - 10.5 K/uL   RBC 5.11 4.22 - 5.81 MIL/uL   Hemoglobin 15.4 13.0 - 17.0 g/dL   HCT 43.6 39.0 - 52.0 %   MCV 85.3 78.0 - 100.0 fL   MCH 30.1 26.0 - 34.0 pg   MCHC 35.3 30.0 - 36.0 g/dL   RDW 12.6 11.5 - 15.5 %   Platelets 227 150 - 400 K/uL  Comprehensive metabolic panel     Status: Abnormal   Collection Time: 08/07/15 11:50 PM  Result Value Ref Range   Sodium 142 135 - 145 mmol/L   Potassium 3.9 3.5 - 5.1 mmol/L   Chloride 105 101 - 111 mmol/L   CO2 26 22 - 32 mmol/L   Glucose, Bld 104 (H) 65 - 99 mg/dL   BUN 14 6 - 20 mg/dL   Creatinine, Ser 0.75 0.61 - 1.24 mg/dL   Calcium 9.5 8.9 - 10.3 mg/dL   Total Protein 7.6 6.5 - 8.1 g/dL   Albumin 4.5 3.5 - 5.0 g/dL   AST 23 15 - 41 U/L   ALT 21 17 - 63 U/L   Alkaline Phosphatase 62 38 - 126 U/L   Total Bilirubin 0.7 0.3 - 1.2 mg/dL   GFR calc non Af Amer >60 >60 mL/min   GFR calc Af Amer >60 >60 mL/min   Anion gap 11 5 - 15  Lipase, blood     Status: None   Collection Time: 08/07/15 11:50 PM  Result Value Ref Range   Lipase 43 11 - 51 U/L  Urinalysis, Routine w reflex microscopic (not at [facility])     Status: Abnormal   Collection Time: 08/08/15 12:02 AM  Result Value Ref Range   Color, Urine AMBER (A) YELLOW   APPearance CLEAR CLEAR   Specific Gravity, Urine 1.029 1.005 - 1.030   pH 5.5 5.0 - 8.0   Glucose, UA NEGATIVE NEGATIVE mg/dL   Hgb urine dipstick MODERATE (A) NEGATIVE   Bilirubin Urine SMALL (A) NEGATIVE   Ketones, ur 15 (A) NEGATIVE mg/dL   Protein, ur NEGATIVE NEGATIVE mg/dL   Nitrite NEGATIVE NEGATIVE   Leukocytes, UA NEGATIVE NEGATIVE  Urine microscopic-add on     Status: Abnormal   Collection Time: 08/08/15  12:02 AM  Result Value Ref Range   Squamous Epithelial / LPF 0-5 (A) NONE SEEN   WBC, UA NONE SEEN 0 - 5 WBC/hpf   RBC / HPF 6-30 0 - 5 RBC/hpf   Bacteria, UA RARE (A) NONE SEEN    I personally performed the  services described in this documentation, which was scribed in my presence. The recorded information has been reviewed and is accurate.   Shanon Rosser, MD 08/08/15 [phone]

## 2015-08-08 LAB — URINALYSIS, ROUTINE W REFLEX MICROSCOPIC
GLUCOSE, UA: NEGATIVE mg/dL
Ketones, ur: 15 mg/dL — AB
LEUKOCYTES UA: NEGATIVE
Nitrite: NEGATIVE
PROTEIN: NEGATIVE mg/dL
SPECIFIC GRAVITY, URINE: 1.029 (ref 1.005–1.030)
pH: 5.5 (ref 5.0–8.0)

## 2015-08-08 LAB — COMPREHENSIVE METABOLIC PANEL
ALK PHOS: 62 U/L (ref 38–126)
ALT: 21 U/L (ref 17–63)
AST: 23 U/L (ref 15–41)
Albumin: 4.5 g/dL (ref 3.5–5.0)
Anion gap: 11 (ref 5–15)
BUN: 14 mg/dL (ref 6–20)
CALCIUM: 9.5 mg/dL (ref 8.9–10.3)
CO2: 26 mmol/L (ref 22–32)
CREATININE: 0.75 mg/dL (ref 0.61–1.24)
Chloride: 105 mmol/L (ref 101–111)
Glucose, Bld: 104 mg/dL — ABNORMAL HIGH (ref 65–99)
Potassium: 3.9 mmol/L (ref 3.5–5.1)
Sodium: 142 mmol/L (ref 135–145)
Total Bilirubin: 0.7 mg/dL (ref 0.3–1.2)
Total Protein: 7.6 g/dL (ref 6.5–8.1)

## 2015-08-08 LAB — URINE MICROSCOPIC-ADD ON: WBC, UA: NONE SEEN WBC/hpf (ref 0–5)

## 2015-08-08 LAB — LIPASE, BLOOD: LIPASE: 43 U/L (ref 11–51)

## 2015-08-08 MED ORDER — PROMETHAZINE HCL 25 MG/ML IJ SOLN
25.0000 mg | Freq: Once | INTRAMUSCULAR | Status: AC
  Administered 2015-08-08: 25 mg via INTRAVENOUS
  Filled 2015-08-08: qty 1

## 2015-08-08 MED ORDER — HYDROMORPHONE HCL 1 MG/ML IJ SOLN
1.0000 mg | Freq: Once | INTRAMUSCULAR | Status: AC
  Administered 2015-08-08: 1 mg via INTRAVENOUS
  Filled 2015-08-08: qty 1

## 2015-08-08 MED ORDER — SODIUM CHLORIDE 0.9 % IV BOLUS (SEPSIS)
1000.0000 mL | Freq: Once | INTRAVENOUS | Status: AC
  Administered 2015-08-08: 1000 mL via INTRAVENOUS

## 2015-10-03 ENCOUNTER — Emergency Department (HOSPITAL_BASED_OUTPATIENT_CLINIC_OR_DEPARTMENT_OTHER)
Admission: EM | Admit: 2015-10-03 | Discharge: 2015-10-03 | Disposition: A | Discharge status: Home / Self Care | Payer: 59 | Attending: Emergency Medicine | Admitting: Emergency Medicine

## 2015-10-03 ENCOUNTER — Encounter (HOSPITAL_BASED_OUTPATIENT_CLINIC_OR_DEPARTMENT_OTHER): Payer: Self-pay | Admitting: *Deleted

## 2015-10-03 DIAGNOSIS — R109 Unspecified abdominal pain: Secondary | ICD-10-CM

## 2015-10-03 DIAGNOSIS — Z9049 Acquired absence of other specified parts of digestive tract: Secondary | ICD-10-CM | POA: Insufficient documentation

## 2015-10-03 DIAGNOSIS — R1011 Right upper quadrant pain: Secondary | ICD-10-CM | POA: Insufficient documentation

## 2015-10-03 DIAGNOSIS — G8929 Other chronic pain: Secondary | ICD-10-CM | POA: Insufficient documentation

## 2015-10-03 DIAGNOSIS — R110 Nausea: Secondary | ICD-10-CM | POA: Insufficient documentation

## 2015-10-03 DIAGNOSIS — F17210 Nicotine dependence, cigarettes, uncomplicated: Secondary | ICD-10-CM | POA: Insufficient documentation

## 2015-10-03 DIAGNOSIS — Z87442 Personal history of urinary calculi: Secondary | ICD-10-CM | POA: Insufficient documentation

## 2015-10-03 NOTE — Discharge Instructions (Signed)
Authorized Agent Controlled Analgesia °Authorized agent controlled analgesia (AACA) is when a family member or someone who cares for an individual gives pain relieving medication (analgesia). The pain medication is given through a device called a patient controlled analgesia (PCA) pump. When a person cannot administer the analgesia through the PCA pump or does not understand when to give analgesia, another person is given this responsibility. They are referred to as an authorized agent. The authorized agent needs to be approved, trained, and responsible to administer a person's analgesia through a PCA pump. One authorized agent will be allowed to dose the patient during a specified time frame. Other family members or visitors should not administer analgesia. °AACA ANALGESIA DOSING °Together, you and the nurse will select a pain scale to use as well as a comfort goal. This may be a number scale or may be based on behaviors of your loved one. The goal of pain management is to make your loved one as comfortable as possible and to reduce pain without making them too sleepy. Reduced pain, not total relief, is a realistic plan. An analgesia dose should not be given if your loved one is sleeping or for reasons other than pain relief, unless directed otherwise.  °When your loved one has pain: °· Activate the delivery of an analgesia dose as instructed. °· Listen for an audible tone. This tone lets you know that the pump received the signal to give the analgesia. The analgesia is delivered through the attached intravenous (IV) tubing and into your loved one. °Doses of analgesia should be attempted anytime your loved one is in pain and awake. However, your loved one may not get a dose of analgesia every time you activate dose delivery. The pump is programmed to a specific time interval between doses. This safety feature attempts to prevent overdosing.  °Nurses will continue to monitor your loved one for pain relief, side  effects, and complications during AACA. Drowsiness, sedation, dizziness, nausea, vomiting, and constipation are some of the side effects of analgesia. Complications of analgesia include slowed breathing, tolerance, and dependence. °Use of analgesics may raise worries about possible dependence or addiction. The short-term use of analgesics will be managed carefully to make sure the chance of dependence is as low as possible. Most patients progress to oral pain relieving medications within a couple days, and the use of AACA is discontinued. °BENEFITS OF AACA  °· Manages pain effectively. °· Minimizes the delay in the patient getting the pain medication they need. °· Reduces number of injections for pain relief. °· Promotes shorter hospital stays. Patients with good pain control get better faster and usually leave the hospital earlier. °SEEK MEDICAL CARE IF: °· You have questions or concerns about AACA. °· There is swelling or leaking at the IV insertion site. °· The skin near the IV insertion site is cool to the touch. °· Your loved one has pain that does not get better with the analgesia. °· Your loved one is dizzy. °· Your loved one has nausea or is vomiting. °· Your loved one is constipated. °SEEK IMMEDIATE MEDICAL CARE IF: °· Your loved one, who was previously awake, cannot be aroused. °· Your loved one is breathing slowly. °· Your loved one is itching. °  °This information is not intended to replace advice given to you by your health care provider. Make sure you discuss any questions you have with your health care provider. °  °Document Released: 08/29/2010 Document Revised: 08/06/2011 Document Reviewed: 08/29/2010 °Elsevier Interactive Patient Education ©2016 [employer]   Inc[employer] ° °

## 2015-10-03 NOTE — ED Provider Notes (Signed)
CSN: [id number]     Arrival date & time 10/03/15  0244 History   First MD Initiated Contact with Patient 10/03/15 [phone]     Chief Complaint  Patient presents with  . Abdominal Pain     (Consider location/radiation/quality/duration/timing/severity/associated sxs/prior Treatment) Patient is a 40 y.o. male presenting with abdominal pain. The history is provided by the patient.  Abdominal Pain Pain location:  RUQ Pain quality: sharp   Pain radiation: moving without specifying where. Pain severity:  Severe Onset quality:  Gradual Timing:  Constant Progression:  Unchanged Chronicity:  Chronic (with he states an acute component) Context: not alcohol use   Relieved by:  Nothing Worsened by:  Nothing tried Ineffective treatments: dilaudid and fentanyl patches. Associated symptoms: nausea   Associated symptoms: no anorexia   Risk factors: not elderly   States he feels the stone on the right moving.  States he called pain management and they referred him to the ED for ongoing pain.  States that there was a "misunderstanding at Pain Management because my wife saw me take my dilaudid but they said my drug screen was negative."      Past Medical History  Diagnosis Date  . Pancreatitis   . Kidney stones   . Pancreatitis, chronic ([facility]) 05/28/2005  . Kidney stones    Past Surgical History  Procedure Laterality Date  . Appendectomy    . Cholecystectomy    . Kidney stone surgery    . Ercp w/ metal stent placement     Family History  Problem Relation Age of Onset  . Cancer Mother   . Heart failure Father    Social History  Substance Use Topics  . Smoking status: Current Every Day Smoker -- 0.50 packs/day    Types: Cigarettes  . Smokeless tobacco: Never Used  . Alcohol Use: Yes     Comment: rarely, pt stated that he drinks once ever 3 months and it is usually a beer at dinner    Review of Systems  Gastrointestinal: Positive for nausea and abdominal pain. Negative for anorexia.   All other systems reviewed and are negative.     Allergies  Cortisone; Eggs or egg-derived products; Iodinated diagnostic agents; Ketorolac tromethamine; and Haldol  Home Medications   Prior to Admission medications   Medication Sig Start Date End Date Taking? Authorizing Provider  cephALEXin (KEFLEX) 500 MG capsule Take 1 capsule (500 mg total) by mouth 2 (two) times daily. 05/26/15   Quintella Reichert, MD  fentaNYL (DURAGESIC - DOSED MCG/HR) 50 MCG/HR Place 50 mcg onto the skin every 3 (three) days.    Historical Provider, MD  potassium chloride (K-DUR) 10 MEQ tablet Take 1 tablet (10 mEq total) by mouth daily. 05/26/15   Quintella Reichert, MD  promethazine (PHENERGAN) 25 MG tablet Take 1 tablet (25 mg total) by mouth every 6 (six) hours as needed for nausea or vomiting. 05/26/15   Quintella Reichert, MD  Tapentadol HCl 75 MG TABS Take 75 mg by mouth every 6 (six) hours as needed (pain).     Historical Provider, MD   BP 155/106 mmHg  Pulse 90  Temp(Src) 98 F (36.7 C) (Oral)  Resp 22  Ht 6\' 1"  (1.854 m)  Wt 195 lb (88.451 kg)  BMI 25.73 kg/m2  SpO2 100% Physical Exam  ED Course  Procedures (including critical care time) Labs Review Labs Reviewed - No data to display  Imaging Review No results found. I have personally reviewed and evaluated these images and lab  results as part of my medical decision-making.   EKG Interpretation None      MDM   Final diagnoses:  Chronic abdominal pain   Filed Vitals:   10/03/15 0300  BP: 155/106  Pulse: 90  Temp: 98 F (36.7 C)  Resp: 22    With nurse Chanin present patient states he is here for RUQ pain related to kidney stones.  In presence of nurse exam was performed and was unremarkable.  EDP had a long discussion with patient and his wife regarding ED care plan as patient stated he was unaware of this.  EDP explained that unless something remarkable was found in terms of exam or vitals patient was not to receive narcotic pain  medication.  Patient was offered non narcotic pain medication and states " I know what works for me.  Nothing else will help."   Given patient states RUQ pain from stones EDP reviewed most recent CT in [facility] system dated 04/26/15.  Singularly,  There were no stones in the right kidney and I doubt one would have formed in less than 6 months.    EDP reviewed Care everywhere and per pain management at [facility] note.  Patient was seen on 09/26/15.  UDS was negative for opioids despite patient reporting he had taken them.  They were concerned by this in addition to early refills and decided to stop writing opioid pain medication for the patient.    Per DEA database:  Patient filled 120 4mg  Dilaudid tabs on 08/29/15 (30 day supply).  He also filled an RX dated 1/11/207 for 75 mcg Fentanyl patches on 09/15/15.    His urine should have opioids in it as he should not have completed his 30 days of dilaudid as of the first and he should have also had fentanyl as he filled the Fentanyl on 09/15/15 for 10 patches which is a 30 day supply.   I suspect diversion of pain medication.  Exam and vitals are benign and reassuring.  He is instructed to follow up with his pain management specialist regarding ongoing chronic pain    Cyriah Childrey, MD 10/03/15 [phone]

## 2015-10-03 NOTE — ED Notes (Addendum)
C/o right upper abd pain that started lunch time Sunday. C/o nausea denies vomiting. C/o sharp and constant. States he has a current kidney stone that is "moving" states that he spoke with his pain management person on call and they suggested that he follow up in the ED if pain is worse. Dr. Randal Buba in to see patient on arrival. MD made patient aware that he now has a care plan and we are unable to prescribe narcotic pain medications. Patient was offered non narcotic medications, but he declined. Instructed to f/u with pain management this morning. Voiced understanding.

## 2016-03-29 DIAGNOSIS — R109 Unspecified abdominal pain: Secondary | ICD-10-CM | POA: Diagnosis not present

## 2016-03-29 DIAGNOSIS — M549 Dorsalgia, unspecified: Secondary | ICD-10-CM | POA: Diagnosis not present

## 2016-03-29 DIAGNOSIS — N23 Unspecified renal colic: Secondary | ICD-10-CM | POA: Diagnosis not present

## 2016-03-31 DIAGNOSIS — R1031 Right lower quadrant pain: Secondary | ICD-10-CM | POA: Diagnosis not present

## 2016-03-31 DIAGNOSIS — M545 Low back pain: Secondary | ICD-10-CM | POA: Diagnosis not present

## 2016-03-31 DIAGNOSIS — Z87442 Personal history of urinary calculi: Secondary | ICD-10-CM | POA: Diagnosis not present

## 2016-03-31 DIAGNOSIS — Z91012 Allergy to eggs: Secondary | ICD-10-CM | POA: Diagnosis not present

## 2016-03-31 DIAGNOSIS — K8681 Exocrine pancreatic insufficiency: Secondary | ICD-10-CM | POA: Diagnosis not present

## 2016-03-31 DIAGNOSIS — Z886 Allergy status to analgesic agent status: Secondary | ICD-10-CM | POA: Diagnosis not present

## 2016-03-31 DIAGNOSIS — N200 Calculus of kidney: Secondary | ICD-10-CM | POA: Diagnosis not present

## 2016-03-31 DIAGNOSIS — Z79899 Other long term (current) drug therapy: Secondary | ICD-10-CM | POA: Diagnosis not present

## 2016-03-31 DIAGNOSIS — R109 Unspecified abdominal pain: Secondary | ICD-10-CM | POA: Diagnosis not present

## 2016-03-31 DIAGNOSIS — F17200 Nicotine dependence, unspecified, uncomplicated: Secondary | ICD-10-CM | POA: Diagnosis not present

## 2016-03-31 DIAGNOSIS — K861 Other chronic pancreatitis: Secondary | ICD-10-CM | POA: Diagnosis not present

## 2016-03-31 DIAGNOSIS — R110 Nausea: Secondary | ICD-10-CM | POA: Diagnosis not present

## 2016-03-31 DIAGNOSIS — Z91041 Radiographic dye allergy status: Secondary | ICD-10-CM | POA: Diagnosis not present

## 2016-03-31 DIAGNOSIS — Z888 Allergy status to other drugs, medicaments and biological substances status: Secondary | ICD-10-CM | POA: Diagnosis not present

## 2016-05-18 DIAGNOSIS — M545 Low back pain: Secondary | ICD-10-CM | POA: Diagnosis not present

## 2016-05-29 DIAGNOSIS — H5203 Hypermetropia, bilateral: Secondary | ICD-10-CM | POA: Diagnosis not present

## 2016-05-29 DIAGNOSIS — H52223 Regular astigmatism, bilateral: Secondary | ICD-10-CM | POA: Diagnosis not present

## 2016-07-07 DIAGNOSIS — Z888 Allergy status to other drugs, medicaments and biological substances status: Secondary | ICD-10-CM | POA: Diagnosis not present

## 2016-07-07 DIAGNOSIS — Z9889 Other specified postprocedural states: Secondary | ICD-10-CM | POA: Diagnosis not present

## 2016-07-07 DIAGNOSIS — Z9049 Acquired absence of other specified parts of digestive tract: Secondary | ICD-10-CM | POA: Diagnosis not present

## 2016-07-07 DIAGNOSIS — K8689 Other specified diseases of pancreas: Secondary | ICD-10-CM | POA: Diagnosis not present

## 2016-07-07 DIAGNOSIS — R5381 Other malaise: Secondary | ICD-10-CM | POA: Diagnosis not present

## 2016-07-07 DIAGNOSIS — K859 Acute pancreatitis without necrosis or infection, unspecified: Secondary | ICD-10-CM | POA: Diagnosis not present

## 2016-07-07 DIAGNOSIS — Z91012 Allergy to eggs: Secondary | ICD-10-CM | POA: Diagnosis not present

## 2016-07-07 DIAGNOSIS — K861 Other chronic pancreatitis: Secondary | ICD-10-CM | POA: Diagnosis not present

## 2016-07-07 DIAGNOSIS — K5730 Diverticulosis of large intestine without perforation or abscess without bleeding: Secondary | ICD-10-CM | POA: Diagnosis not present

## 2016-07-08 DIAGNOSIS — K859 Acute pancreatitis without necrosis or infection, unspecified: Secondary | ICD-10-CM | POA: Diagnosis not present

## 2016-08-21 DIAGNOSIS — K861 Other chronic pancreatitis: Secondary | ICD-10-CM | POA: Diagnosis not present

## 2016-08-21 DIAGNOSIS — Z6827 Body mass index (BMI) 27.0-27.9, adult: Secondary | ICD-10-CM | POA: Diagnosis not present

## 2016-08-21 DIAGNOSIS — R030 Elevated blood-pressure reading, without diagnosis of hypertension: Secondary | ICD-10-CM | POA: Diagnosis not present

## 2016-08-21 DIAGNOSIS — F17210 Nicotine dependence, cigarettes, uncomplicated: Secondary | ICD-10-CM | POA: Diagnosis not present

## 2016-08-30 DIAGNOSIS — R112 Nausea with vomiting, unspecified: Secondary | ICD-10-CM | POA: Diagnosis not present

## 2016-08-30 DIAGNOSIS — R194 Change in bowel habit: Secondary | ICD-10-CM | POA: Diagnosis not present

## 2016-08-30 DIAGNOSIS — R109 Unspecified abdominal pain: Secondary | ICD-10-CM | POA: Diagnosis not present

## 2016-08-30 DIAGNOSIS — K861 Other chronic pancreatitis: Secondary | ICD-10-CM | POA: Diagnosis not present

## 2016-09-19 DIAGNOSIS — K2950 Unspecified chronic gastritis without bleeding: Secondary | ICD-10-CM | POA: Diagnosis not present

## 2016-09-19 DIAGNOSIS — K2970 Gastritis, unspecified, without bleeding: Secondary | ICD-10-CM | POA: Diagnosis not present

## 2016-09-19 DIAGNOSIS — R1033 Periumbilical pain: Secondary | ICD-10-CM | POA: Diagnosis not present

## 2016-09-19 DIAGNOSIS — K3189 Other diseases of stomach and duodenum: Secondary | ICD-10-CM | POA: Diagnosis not present

## 2016-09-21 DIAGNOSIS — R030 Elevated blood-pressure reading, without diagnosis of hypertension: Secondary | ICD-10-CM | POA: Diagnosis not present

## 2016-09-25 DIAGNOSIS — Z0000 Encounter for general adult medical examination without abnormal findings: Secondary | ICD-10-CM | POA: Diagnosis not present

## 2016-11-01 DIAGNOSIS — R030 Elevated blood-pressure reading, without diagnosis of hypertension: Secondary | ICD-10-CM | POA: Diagnosis not present

## 2016-11-01 DIAGNOSIS — G894 Chronic pain syndrome: Secondary | ICD-10-CM | POA: Diagnosis not present

## 2016-11-01 DIAGNOSIS — F17210 Nicotine dependence, cigarettes, uncomplicated: Secondary | ICD-10-CM | POA: Diagnosis not present

## 2016-11-01 DIAGNOSIS — K861 Other chronic pancreatitis: Secondary | ICD-10-CM | POA: Diagnosis not present

## 2016-12-18 ENCOUNTER — Encounter (HOSPITAL_BASED_OUTPATIENT_CLINIC_OR_DEPARTMENT_OTHER): Payer: Self-pay | Admitting: Emergency Medicine

## 2016-12-18 DIAGNOSIS — F17210 Nicotine dependence, cigarettes, uncomplicated: Secondary | ICD-10-CM | POA: Diagnosis not present

## 2016-12-18 DIAGNOSIS — Z79899 Other long term (current) drug therapy: Secondary | ICD-10-CM | POA: Insufficient documentation

## 2016-12-18 DIAGNOSIS — R5383 Other fatigue: Secondary | ICD-10-CM | POA: Diagnosis not present

## 2016-12-18 NOTE — ED Triage Notes (Addendum)
Pt presents with 3 day hx of fatigue and feels anxious and "off" denies pain or specific sx just "feels off"

## 2016-12-19 ENCOUNTER — Emergency Department (HOSPITAL_BASED_OUTPATIENT_CLINIC_OR_DEPARTMENT_OTHER)
Admission: EM | Admit: 2016-12-19 | Discharge: 2016-12-19 | Disposition: A | Discharge status: Home / Self Care | Payer: Commercial Managed Care - PPO | Attending: Emergency Medicine | Admitting: Emergency Medicine

## 2016-12-19 DIAGNOSIS — R5383 Other fatigue: Secondary | ICD-10-CM

## 2016-12-19 HISTORY — DX: Unspecified abdominal pain: R10.9

## 2016-12-19 HISTORY — DX: Other chronic pain: G89.29

## 2016-12-19 LAB — CBC WITH DIFFERENTIAL/PLATELET
Basophils Absolute: 0.1 10*3/uL (ref 0.0–0.1)
Basophils Relative: 1 %
EOS ABS: 0.2 10*3/uL (ref 0.0–0.7)
EOS PCT: 2 %
HCT: 41.7 % (ref 39.0–52.0)
Hemoglobin: 15.3 g/dL (ref 13.0–17.0)
LYMPHS ABS: 3.4 10*3/uL (ref 0.7–4.0)
Lymphocytes Relative: 26 %
MCH: 30.7 pg (ref 26.0–34.0)
MCHC: 36.7 g/dL — AB (ref 30.0–36.0)
MCV: 83.7 fL (ref 78.0–100.0)
MONO ABS: 1.8 10*3/uL — AB (ref 0.1–1.0)
MONOS PCT: 14 %
Neutro Abs: 7.6 10*3/uL (ref 1.7–7.7)
Neutrophils Relative %: 58 %
PLATELETS: 232 10*3/uL (ref 150–400)
RBC: 4.98 MIL/uL (ref 4.22–5.81)
RDW: 12.9 % (ref 11.5–15.5)
WBC: 13.1 10*3/uL — ABNORMAL HIGH (ref 4.0–10.5)

## 2016-12-19 LAB — COMPREHENSIVE METABOLIC PANEL
ALT: 17 U/L (ref 17–63)
AST: 16 U/L (ref 15–41)
Albumin: 4.2 g/dL (ref 3.5–5.0)
Alkaline Phosphatase: 61 U/L (ref 38–126)
Anion gap: 8 (ref 5–15)
BUN: 14 mg/dL (ref 6–20)
CHLORIDE: 111 mmol/L (ref 101–111)
CO2: 22 mmol/L (ref 22–32)
CREATININE: 0.91 mg/dL (ref 0.61–1.24)
Calcium: 9.2 mg/dL (ref 8.9–10.3)
GFR calc Af Amer: >60 mL/min (ref >60)
GLUCOSE: 95 mg/dL (ref 65–99)
Potassium: 3.4 mmol/L — ABNORMAL LOW (ref 3.5–5.1)
SODIUM: 141 mmol/L (ref 135–145)
Total Bilirubin: 0.8 mg/dL (ref 0.3–1.2)
Total Protein: 7.1 g/dL (ref 6.5–8.1)

## 2016-12-19 MED ORDER — POTASSIUM CHLORIDE CRYS ER 20 MEQ PO TBCR
40.0000 meq | EXTENDED_RELEASE_TABLET | Freq: Once | ORAL | Status: AC
  Administered 2016-12-19: 40 meq via ORAL
  Filled 2016-12-19: qty 2

## 2016-12-19 MED ORDER — SODIUM CHLORIDE 0.9 % IV BOLUS (SEPSIS)
1000.0000 mL | Freq: Once | INTRAVENOUS | Status: AC
  Administered 2016-12-19: 1000 mL via INTRAVENOUS

## 2016-12-19 MED ORDER — HYDROXYZINE HCL 25 MG PO TABS
50.0000 mg | ORAL_TABLET | Freq: Once | ORAL | Status: AC
  Administered 2016-12-19: 50 mg via ORAL
  Filled 2016-12-19: qty 2

## 2016-12-19 NOTE — ED Notes (Signed)
Pt stated that he felt like agitated inside for 2 days but he has been sick on and off for 2 weeks.  He felt drained inside.

## 2016-12-19 NOTE — ED Provider Notes (Signed)
[facility] [facility] Provider Note   CSN: [id number] Arrival date & time: 12/18/16  2330     History   Chief Complaint Chief Complaint  Patient presents with  . Fatigue    HPI [name] is a 41 y.o. male.  HPI  41 year old male with a history of chronic pancreatitis presents with fatigue for 2-3 days. Feels like he's run down like he has the flu except he has no other symptoms. He states that he has chronic pancreatitis and last had a flare one or 2 weeks ago. Sometimes he will feel like this after a flare but has not lasted this long. There is no focal weakness or any numbness. There is no dizziness, lightheadedness, headache, chest pain, shortness of breath. No abdominal pain, vomiting, or diarrhea. He also feels like he's having anxiety that is not showing on the outside. He feels it inside. He feels both agitation and anxiety. He states he lost weight after the most recent pancreatitis attack but this is not atypical for him. Otherwise has notpain or swelling.  Past Medical History:  Diagnosis Date  . Chronic abdominal pain   . Kidney stones   . Kidney stones   . Pancreatitis   . Pancreatitis, chronic ([place]) 05/28/2005    There are no active problems to display for this patient.   Past Surgical History:  Procedure Laterality Date  . APPENDECTOMY    . CHOLECYSTECTOMY    . ERCP W/ METAL STENT PLACEMENT    . KIDNEY STONE SURGERY         Home Medications    Prior to Admission medications   Medication Sig Start Date End Date Taking? Authorizing Provider  oxycodone (ROXICODONE) 30 MG immediate release tablet Take 30 mg by mouth every 6 (six) hours as needed for pain.   Yes [provider]  topiramate (TOPAMAX) 25 MG tablet Take 25 mg by mouth 2 (two) times daily.   Yes [provider]  promethazine (PHENERGAN) 25 MG tablet Take 1 tablet (25 mg total) by mouth every 6 (six) hours as needed for nausea or vomiting. 05/26/15   Quintella Reichert, MD     Family History Family History  Problem Relation Age of Onset  . Cancer Mother   . Heart failure Father     Social History Social History  Substance Use Topics  . Smoking status: Current Every Day Smoker    Packs/day: 0.50    Types: Cigarettes  . Smokeless tobacco: Never Used  . Alcohol use Yes     Comment: rarely, pt stated that he drinks once ever 3 months and it is usually a beer at dinner     Allergies   Cortisone; Eggs or egg-derived products; Iodinated diagnostic agents; Ketorolac tromethamine; and Haldol [haloperidol lactate]   Review of Systems Review of Systems  Constitutional: Positive for fatigue. Negative for fever.  Respiratory: Negative for shortness of breath.   Cardiovascular: Negative for chest pain.  Gastrointestinal: Negative for abdominal pain, diarrhea, nausea and vomiting.  Genitourinary: Negative for dysuria.  Musculoskeletal: Negative for back pain and neck pain.  Neurological: Negative for dizziness, weakness, light-headedness, numbness and headaches.  All other systems reviewed and are negative.    Physical Exam Updated Vital Signs BP (!) 135/98 (BP Location: Left Arm)   Pulse 100   Temp 98.6 F (37 C) (Oral)   Resp 18   Ht 6\' 1"  (1.854 m)   Wt 86.2 kg (190 lb)   SpO2 100%   BMI  25.07 kg/m   Physical Exam  Constitutional: He is oriented to person, place, and time. He appears well-developed and well-nourished. No distress.  HENT:  Head: Normocephalic and atraumatic.  Right Ear: External ear normal.  Left Ear: External ear normal.  Nose: Nose normal.  Eyes: Pupils are equal, round, and reactive to light. EOM are normal. Right eye exhibits no discharge. Left eye exhibits no discharge.  Neck: Normal range of motion. Neck supple. No thyromegaly present.  Cardiovascular: Normal rate, regular rhythm and normal heart sounds.   Pulmonary/Chest: Effort normal and breath sounds normal.  Abdominal: Soft. He exhibits no distension. There  is no tenderness.  Musculoskeletal: He exhibits no edema.  Neurological: He is alert and oriented to person, place, and time.  CN 3-12 grossly intact. 5/5 strength in all 4 extremities. Grossly normal sensation. Normal finger to nose.   Skin: Skin is warm and dry. He is not diaphoretic.  Nursing note and vitals reviewed.    ED Treatments / Results  Labs (all labs ordered are listed, but only abnormal results are displayed) Labs Reviewed  COMPREHENSIVE METABOLIC PANEL - Abnormal; Notable for the following:       Result Value   Potassium 3.4 (*)    All other components within normal limits  CBC WITH DIFFERENTIAL/PLATELET - Abnormal; Notable for the following:    WBC 13.1 (*)    MCHC 36.7 (*)    Monocytes Absolute 1.8 (*)    All other components within normal limits    EKG  EKG Interpretation  Date/Time:  Wednesday December 19 2016 01:21:41 EDT Ventricular Rate:  82 PR Interval:    QRS Duration: 92 QT Interval:  405 QTC Calculation: 473 R Axis:   45 Text Interpretation:  Sinus rhythm Atrial premature complexes tachycardia no longer present compared to 2016 Confirmed by Sherwood Gambler [phone]) on 12/19/2016 1:30:21 AM       Radiology No results found.  Procedures Procedures (including critical care time)  Medications Ordered in ED Medications  potassium chloride SA (K-DUR,KLOR-CON) CR tablet 40 mEq (not administered)  sodium chloride 0.9 % bolus 1,000 mL (1,000 mLs Intravenous New Bag/Given 12/19/16 0116)  hydrOXYzine (ATARAX/VISTARIL) tablet 50 mg (50 mg Oral Given 12/19/16 0117)     Initial Impression / Assessment and Plan / ED Course  I have reviewed the triage vital signs and the nursing notes.  Pertinent labs & imaging results that were available during my care of the patient were reviewed by me and considered in my medical decision making (see chart for details).     His potassium is mildly low at 3.4 but I doubt this is causing symptoms. Otherwise there is no  focal finding on exam or in the history to suggest a clear cause. His blood work is unremarkable otherwise. ECG without acute findings that would suggest why he is feeling acutely fatigued. Atarax given for the anxiety symptoms. He has been on Topamax for last 3 weeks but I'm not sure if this would be causing his symptoms or not. No clear thyromegaly. He will likely need more outpatient testing with his PCP but at this time there does not appear to be emergent pathology. Discussed return precautions and need for PCP follow-up.  Final Clinical Impressions(s) / ED Diagnoses   Final diagnoses:  Fatigue, unspecified type    New Prescriptions New Prescriptions   No medications on file     Sherwood Gambler, MD 12/19/16 [phone]

## 2016-12-19 NOTE — ED Notes (Signed)
ED Provider at bedside. 

## 2017-05-14 ENCOUNTER — Emergency Department (HOSPITAL_BASED_OUTPATIENT_CLINIC_OR_DEPARTMENT_OTHER)
Admission: EM | Admit: 2017-05-14 | Discharge: 2017-05-15 | Disposition: A | Discharge status: Home / Self Care | Payer: Commercial Managed Care - PPO | Attending: Emergency Medicine | Admitting: Emergency Medicine

## 2017-05-14 ENCOUNTER — Other Ambulatory Visit: Payer: Self-pay

## 2017-05-14 ENCOUNTER — Encounter (HOSPITAL_BASED_OUTPATIENT_CLINIC_OR_DEPARTMENT_OTHER): Payer: Self-pay | Admitting: *Deleted

## 2017-05-14 DIAGNOSIS — Z79899 Other long term (current) drug therapy: Secondary | ICD-10-CM | POA: Diagnosis not present

## 2017-05-14 DIAGNOSIS — R110 Nausea: Secondary | ICD-10-CM | POA: Diagnosis not present

## 2017-05-14 DIAGNOSIS — F17210 Nicotine dependence, cigarettes, uncomplicated: Secondary | ICD-10-CM | POA: Diagnosis not present

## 2017-05-14 DIAGNOSIS — R1013 Epigastric pain: Secondary | ICD-10-CM | POA: Diagnosis not present

## 2017-05-14 DIAGNOSIS — E876 Hypokalemia: Secondary | ICD-10-CM | POA: Diagnosis not present

## 2017-05-14 LAB — COMPREHENSIVE METABOLIC PANEL
ALBUMIN: 4.7 g/dL (ref 3.5–5.0)
ALK PHOS: 65 U/L (ref 38–126)
ALT: 12 U/L — ABNORMAL LOW (ref 17–63)
ANION GAP: 7 (ref 5–15)
AST: 15 U/L (ref 15–41)
BUN: 11 mg/dL (ref 6–20)
CO2: 23 mmol/L (ref 22–32)
Calcium: 9.7 mg/dL (ref 8.9–10.3)
Chloride: 112 mmol/L — ABNORMAL HIGH (ref 101–111)
Creatinine, Ser: 0.88 mg/dL (ref 0.61–1.24)
GFR calc Af Amer: >60 mL/min (ref >60)
GFR calc non Af Amer: >60 mL/min (ref >60)
GLUCOSE: 105 mg/dL — AB (ref 65–99)
POTASSIUM: 3.3 mmol/L — AB (ref 3.5–5.1)
SODIUM: 142 mmol/L (ref 135–145)
Total Bilirubin: 0.4 mg/dL (ref 0.3–1.2)
Total Protein: 7.4 g/dL (ref 6.5–8.1)

## 2017-05-14 LAB — CBC
HEMATOCRIT: 41.9 % (ref 39.0–52.0)
HEMOGLOBIN: 14.8 g/dL (ref 13.0–17.0)
MCH: 30 pg (ref 26.0–34.0)
MCHC: 35.3 g/dL (ref 30.0–36.0)
MCV: 85 fL (ref 78.0–100.0)
Platelets: 217 10*3/uL (ref 150–400)
RBC: 4.93 MIL/uL (ref 4.22–5.81)
RDW: 12.8 % (ref 11.5–15.5)
WBC: 9.9 10*3/uL (ref 4.0–10.5)

## 2017-05-14 LAB — URINALYSIS, ROUTINE W REFLEX MICROSCOPIC
BILIRUBIN URINE: NEGATIVE
GLUCOSE, UA: NEGATIVE mg/dL
Ketones, ur: NEGATIVE mg/dL
Leukocytes, UA: NEGATIVE
Nitrite: NEGATIVE
PH: 6 (ref 5.0–8.0)
Protein, ur: NEGATIVE mg/dL
SPECIFIC GRAVITY, URINE: 1.015 (ref 1.005–1.030)

## 2017-05-14 LAB — LIPASE, BLOOD: Lipase: 58 U/L — ABNORMAL HIGH (ref 11–51)

## 2017-05-14 LAB — URINALYSIS, MICROSCOPIC (REFLEX): WBC UA: NONE SEEN WBC/hpf (ref 0–5)

## 2017-05-14 MED ORDER — ONDANSETRON HCL 4 MG PO TABS: 4.0000 mg | ORAL_TABLET | Freq: Three times a day (TID) | ORAL | 0 refills | Status: DC | PRN

## 2017-05-14 MED ORDER — ONDANSETRON HCL 4 MG/2ML IJ SOLN
4.0000 mg | Freq: Once | INTRAMUSCULAR | Status: AC
  Administered 2017-05-14: 4 mg via INTRAVENOUS
  Filled 2017-05-14: qty 2

## 2017-05-14 MED ORDER — MORPHINE SULFATE (PF) 4 MG/ML IV SOLN
4.0000 mg | Freq: Once | INTRAVENOUS | Status: AC
  Administered 2017-05-14: 4 mg via INTRAVENOUS
  Filled 2017-05-14: qty 1

## 2017-05-14 MED ORDER — SODIUM CHLORIDE 0.9 % IV BOLUS (SEPSIS)
1000.0000 mL | Freq: Once | INTRAVENOUS | Status: AC
  Administered 2017-05-14: 1000 mL via INTRAVENOUS

## 2017-05-14 NOTE — ED Notes (Signed)
Worried about wait time. Asking how long is the wait time for other hospitals. Informed we do not give that information.

## 2017-05-14 NOTE — ED Triage Notes (Signed)
Abdominal pain this afternoon. Hx pancreatitis. He had an episode of stumbling and agitation. He ran out of pain medication yesterday and has not had any today. He states when his potassium gets low he acts this way. He is alert appropriate at triage.

## 2017-05-15 NOTE — ED Provider Notes (Signed)
[name] Provider Note   CSN: [id number] Arrival date & time: 05/14/17  1858     History   Chief Complaint Chief Complaint  Patient presents with  . Abdominal Pain    HPI [name] is a 41 y.o. male presenting with abdominal pain.  Patient states that around 4:00 this afternoon, he had acute onset epigastric abdominal pain which radiates directly back.  He is out of his pain medication, has a refill at the pharmacy for tomorrow.  He is concerned his potassium is low, as he has had similar symptoms with hypokalemia.  He reports nausea without vomiting.  He denies fevers, chills, chest pain, shortness of breath, lower abdominal pain, urinary symptoms, abnormal bowel movements.  He denies recent travel or foods.  He denies new medicines.  He denies alcohol use.  Patient states he has a history of pancreatitis, however looking at [name] note from earlier this year, ?diagnosis.  Patient states he takes Zofran and Phenergan as needed for nausea, last had Phenergan around 3:00 this afternoon.  The pain is described as severe, sharp, and stabbing.  It is constant.  Nothing makes it better or worse.  Abdominal surgeries include appendectomy, cholecystectomy, and ERCP with stent.  HPI  Past Medical History:  Diagnosis Date  . Chronic abdominal pain   . Kidney stones   . Kidney stones   . Pancreatitis   . Pancreatitis, chronic ([place]) 05/28/2005    There are no active problems to display for this patient.   Past Surgical History:  Procedure Laterality Date  . APPENDECTOMY    . CHOLECYSTECTOMY    . ERCP W/ METAL STENT PLACEMENT    . KIDNEY STONE SURGERY         Home Medications    Prior to Admission medications   Medication Sig Start Date End Date Taking? Authorizing Provider  LORazepam (ATIVAN PO) Take by mouth.   Yes [provider]  oxycodone (ROXICODONE) 30 MG immediate release tablet Take 30 mg by mouth every 6 (six) hours as needed  for pain.   Yes [provider]  promethazine (PHENERGAN) 25 MG tablet Take 1 tablet (25 mg total) by mouth every 6 (six) hours as needed for nausea or vomiting. 05/26/15  Yes Quintella Reichert, MD  topiramate (TOPAMAX) 25 MG tablet Take 25 mg by mouth 2 (two) times daily.   Yes [provider]  ondansetron (ZOFRAN) 4 MG tablet Take 1 tablet (4 mg total) by mouth every 8 (eight) hours as needed for nausea or vomiting. 05/14/17   Fionn Stracke, PA-C    Family History Family History  Problem Relation Age of Onset  . Cancer Mother   . Heart failure Father     Social History Social History   Tobacco Use  . Smoking status: Current Every Day Smoker    Packs/day: 0.50    Types: Cigarettes  . Smokeless tobacco: Never Used  Substance Use Topics  . Alcohol use: Yes    Comment: rarely, pt stated that he drinks once ever 3 months and it is usually a beer at dinner  . Drug use: No     Allergies   Cortisone; Eggs or egg-derived products; Iodinated diagnostic agents; Ketorolac tromethamine; and Haldol [haloperidol lactate]   Review of Systems Review of Systems  Constitutional: Negative for chills and fever.  HENT: Negative for congestion.   Respiratory: Negative for cough.   Cardiovascular: Negative for chest pain.  Gastrointestinal: Positive for abdominal pain and  nausea. Negative for abdominal distention, constipation, diarrhea and vomiting.  Genitourinary: Negative for dysuria, frequency and hematuria.  Musculoskeletal: Negative for neck pain.  Skin: Negative for wound.  Allergic/Immunologic: Negative for immunocompromised state.  Neurological: Negative for headaches.  Hematological: Does not bruise/bleed easily.     Physical Exam Updated Vital Signs BP (!) 129/95 (BP Location: Right Arm)   Pulse 81   Temp 98.5 F (36.9 C) (Oral)   Resp 16   Ht 6\' 1"  (1.854 m)   Wt 83.9 kg (185 lb)   SpO2 100%   BMI 24.41 kg/m   Physical Exam  Constitutional: He  is oriented to person, place, and time. He appears well-developed and well-nourished. No distress.  HENT:  Head: Normocephalic and atraumatic.  Eyes: Conjunctivae and EOM are normal. Pupils are equal, round, and reactive to light.  Neck: Normal range of motion. Neck supple.  Cardiovascular: Normal rate, regular rhythm and intact distal pulses.  Pulmonary/Chest: Effort normal and breath sounds normal. No respiratory distress. He has no wheezes.  Abdominal: Soft. Bowel sounds are normal. He exhibits no distension and no mass. There is tenderness. There is no rebound and no guarding.  Tenderness to palpation of epigastric and right upper quadrant abdomen.  No rigidity, guarding, or distention.  No rebound.  Bowel sounds normoactive x4.  Musculoskeletal: Normal range of motion.  Neurological: He is alert and oriented to person, place, and time.  Skin: Skin is warm and dry.  Psychiatric: He has a normal mood and affect.  Nursing note and vitals reviewed.    ED Treatments / Results  Labs (all labs ordered are listed, but only abnormal results are displayed) Labs Reviewed  LIPASE, BLOOD - Abnormal; Notable for the following components:      Result Value   Lipase 58 (*)    All other components within normal limits  COMPREHENSIVE METABOLIC PANEL - Abnormal; Notable for the following components:   Potassium 3.3 (*)    Chloride 112 (*)    Glucose, Bld 105 (*)    ALT 12 (*)    All other components within normal limits  URINALYSIS, ROUTINE W REFLEX MICROSCOPIC - Abnormal; Notable for the following components:   Hgb urine dipstick TRACE (*)    All other components within normal limits  URINALYSIS, MICROSCOPIC (REFLEX) - Abnormal; Notable for the following components:   Bacteria, UA RARE (*)    Squamous Epithelial / LPF 0-5 (*)    All other components within normal limits  CBC    EKG  EKG Interpretation None       Radiology No results found.  Procedures Procedures (including  critical care time)  Medications Ordered in ED Medications  sodium chloride 0.9 % bolus 1,000 mL (0 mLs Intravenous Stopped 05/15/17 0014)  ondansetron (ZOFRAN) injection 4 mg (4 mg Intravenous Given 05/14/17 2233)  morphine 4 MG/ML injection 4 mg (4 mg Intravenous Given 05/14/17 2233)     Initial Impression / Assessment and Plan / ED Course  I have reviewed the triage vital signs and the nursing notes.  Pertinent labs & imaging results that were available during my care of the patient were reviewed by me and considered in my medical decision making (see chart for details).     Presenting for evaluation of abdominal pain.  Physical exam reassuring, he is afebrile not tachycardic.  Abdominal exam shows tenderness without rigidity, guarding, or distention.  No vomiting at this time.  Labs reassuring, no leukocytosis.  Slight hypokalemia.  Lipase  mildly elevated at 58.  Liver enzymes are reassuring.  UA without infection.  Will give bolus of fluids, Zofran for nausea, and morphine for pain. I checked [facility] which shows patient is due for prescription pickup tomorrow, and he has been taking his medicines as prescribed.  At this time, I do not believe patient needs CT scan.  Doubt infection, perforation, obstruction.  On reassessment, patient states symptoms are improved.  PO challenge without difficulty.  Patient to be discharged home with his normal at home medications.  Discussed taking potassium, patient states he has pills at home.  Patient to follow-up with primary care symptoms do not improve.  At this time, patient appears safe for discharge.  Return precautions given.  Patient states he understands and agrees to plan.   Final Clinical Impressions(s) / ED Diagnoses   Final diagnoses:  Epigastric abdominal pain  Nausea  Hypokalemia    ED Discharge Orders        Ordered    ondansetron (ZOFRAN) 4 MG tablet  Every 8 hours PRN     05/14/17 2359       Brucha Ahlquist,  PA-C 05/15/17 Sidell, Manti, DO 05/16/17 1545

## 2017-05-15 NOTE — Discharge Instructions (Signed)
Continue taking your at home medications as prescribed.  Make sure you stay well hydrated with water.  Take a potassium pill once a day for the next week.  Follow up with your primary care doctor in 1 week if your pain is not improving.  Return to the ER if you develop fevers, persistent vomiting, you belly becomes hard and swollen, or any new or worsening symptoms.

## 2017-09-10 ENCOUNTER — Encounter (HOSPITAL_BASED_OUTPATIENT_CLINIC_OR_DEPARTMENT_OTHER): Payer: Self-pay | Admitting: *Deleted

## 2017-09-10 ENCOUNTER — Emergency Department (HOSPITAL_BASED_OUTPATIENT_CLINIC_OR_DEPARTMENT_OTHER): Payer: Commercial Managed Care - PPO

## 2017-09-10 ENCOUNTER — Emergency Department (HOSPITAL_COMMUNITY)
Admission: EM | Admit: 2017-09-10 | Discharge: 2017-09-10 | Disposition: A | Discharge status: Home / Self Care | Payer: Commercial Managed Care - PPO | Attending: Emergency Medicine | Admitting: Emergency Medicine

## 2017-09-10 ENCOUNTER — Encounter (HOSPITAL_COMMUNITY): Payer: Self-pay | Admitting: *Deleted

## 2017-09-10 ENCOUNTER — Emergency Department (HOSPITAL_BASED_OUTPATIENT_CLINIC_OR_DEPARTMENT_OTHER)
Admission: EM | Admit: 2017-09-10 | Discharge: 2017-09-10 | Disposition: A | Discharge status: Home / Self Care | Payer: Commercial Managed Care - PPO | Source: Home / Self Care | Attending: Emergency Medicine | Admitting: Emergency Medicine

## 2017-09-10 ENCOUNTER — Emergency Department (HOSPITAL_COMMUNITY): Payer: Commercial Managed Care - PPO

## 2017-09-10 ENCOUNTER — Other Ambulatory Visit: Payer: Self-pay

## 2017-09-10 DIAGNOSIS — R112 Nausea with vomiting, unspecified: Secondary | ICD-10-CM | POA: Insufficient documentation

## 2017-09-10 DIAGNOSIS — R079 Chest pain, unspecified: Secondary | ICD-10-CM | POA: Diagnosis not present

## 2017-09-10 DIAGNOSIS — Z5321 Procedure and treatment not carried out due to patient leaving prior to being seen by health care provider: Secondary | ICD-10-CM | POA: Diagnosis not present

## 2017-09-10 DIAGNOSIS — F17210 Nicotine dependence, cigarettes, uncomplicated: Secondary | ICD-10-CM | POA: Insufficient documentation

## 2017-09-10 DIAGNOSIS — Z79899 Other long term (current) drug therapy: Secondary | ICD-10-CM | POA: Insufficient documentation

## 2017-09-10 DIAGNOSIS — R1013 Epigastric pain: Secondary | ICD-10-CM | POA: Insufficient documentation

## 2017-09-10 LAB — COMPREHENSIVE METABOLIC PANEL
ALBUMIN: 4.5 g/dL (ref 3.5–5.0)
ALK PHOS: 52 U/L (ref 38–126)
ALT: 30 U/L (ref 17–63)
AST: 33 U/L (ref 15–41)
Anion gap: 11 (ref 5–15)
BILIRUBIN TOTAL: 0.6 mg/dL (ref 0.3–1.2)
BUN: 12 mg/dL (ref 6–20)
CALCIUM: 9.6 mg/dL (ref 8.9–10.3)
CO2: 19 mmol/L — ABNORMAL LOW (ref 22–32)
Chloride: 109 mmol/L (ref 101–111)
Creatinine, Ser: 0.96 mg/dL (ref 0.61–1.24)
GFR calc Af Amer: >60 mL/min (ref >60)
GFR calc non Af Amer: >60 mL/min (ref >60)
GLUCOSE: 144 mg/dL — AB (ref 65–99)
Potassium: 3.1 mmol/L — ABNORMAL LOW (ref 3.5–5.1)
Sodium: 139 mmol/L (ref 135–145)
TOTAL PROTEIN: 7.6 g/dL (ref 6.5–8.1)

## 2017-09-10 LAB — CBC
HCT: 45.1 % (ref 39.0–52.0)
HEMOGLOBIN: 16.3 g/dL (ref 13.0–17.0)
MCH: 30.6 pg (ref 26.0–34.0)
MCHC: 36.1 g/dL — AB (ref 30.0–36.0)
MCV: 84.8 fL (ref 78.0–100.0)
Platelets: 247 10*3/uL (ref 150–400)
RBC: 5.32 MIL/uL (ref 4.22–5.81)
RDW: 12.7 % (ref 11.5–15.5)
WBC: 14.8 10*3/uL — ABNORMAL HIGH (ref 4.0–10.5)

## 2017-09-10 LAB — BASIC METABOLIC PANEL
ANION GAP: 11 (ref 5–15)
BUN: 10 mg/dL (ref 6–20)
CALCIUM: 9.9 mg/dL (ref 8.9–10.3)
CO2: 20 mmol/L — ABNORMAL LOW (ref 22–32)
Chloride: 108 mmol/L (ref 101–111)
Creatinine, Ser: 0.95 mg/dL (ref 0.61–1.24)
GFR calc non Af Amer: >60 mL/min (ref >60)
GLUCOSE: 117 mg/dL — AB (ref 65–99)
Potassium: 3.6 mmol/L (ref 3.5–5.1)
Sodium: 139 mmol/L (ref 135–145)

## 2017-09-10 LAB — CBC WITH DIFFERENTIAL/PLATELET
BASOS PCT: 0 %
Basophils Absolute: 0 10*3/uL (ref 0.0–0.1)
EOS ABS: 0 10*3/uL (ref 0.0–0.7)
Eosinophils Relative: 0 %
HCT: 41.8 % (ref 39.0–52.0)
Hemoglobin: 15.1 g/dL (ref 13.0–17.0)
Lymphocytes Relative: 7 %
Lymphs Abs: 1.6 10*3/uL (ref 0.7–4.0)
MCH: 30.5 pg (ref 26.0–34.0)
MCHC: 36.1 g/dL — ABNORMAL HIGH (ref 30.0–36.0)
MCV: 84.4 fL (ref 78.0–100.0)
MONO ABS: 1.4 10*3/uL — AB (ref 0.1–1.0)
Monocytes Relative: 6 %
NEUTROS ABS: 20.5 10*3/uL — AB (ref 1.7–7.7)
Neutrophils Relative %: 87 %
PLATELETS: 253 10*3/uL (ref 150–400)
RBC: 4.95 MIL/uL (ref 4.22–5.81)
RDW: 12.9 % (ref 11.5–15.5)
WBC: 23.5 10*3/uL — ABNORMAL HIGH (ref 4.0–10.5)

## 2017-09-10 LAB — RAPID URINE DRUG SCREEN, HOSP PERFORMED
Amphetamines: NOT DETECTED
Barbiturates: NOT DETECTED
Benzodiazepines: POSITIVE — AB
Cocaine: NOT DETECTED
OPIATES: NOT DETECTED
TETRAHYDROCANNABINOL: NOT DETECTED

## 2017-09-10 LAB — URINALYSIS, ROUTINE W REFLEX MICROSCOPIC
Bilirubin Urine: NEGATIVE
GLUCOSE, UA: NEGATIVE mg/dL
Ketones, ur: NEGATIVE mg/dL
LEUKOCYTES UA: NEGATIVE
Nitrite: NEGATIVE
PROTEIN: NEGATIVE mg/dL
Specific Gravity, Urine: 1.01 (ref 1.005–1.030)
pH: 6 (ref 5.0–8.0)

## 2017-09-10 LAB — LIPASE, BLOOD: Lipase: 25 U/L (ref 11–51)

## 2017-09-10 LAB — URINALYSIS, MICROSCOPIC (REFLEX)

## 2017-09-10 LAB — I-STAT TROPONIN, ED: TROPONIN I, POC: 0 ng/mL (ref 0.00–0.08)

## 2017-09-10 MED ORDER — ONDANSETRON 4 MG PO TBDP: 4.0000 mg | ORAL_TABLET | ORAL | 0 refills | Status: DC | PRN

## 2017-09-10 MED ORDER — SODIUM CHLORIDE 0.9 % IV BOLUS (SEPSIS)
1000.0000 mL | Freq: Once | INTRAVENOUS | Status: AC
  Administered 2017-09-10: 1000 mL via INTRAVENOUS

## 2017-09-10 MED ORDER — OXYCODONE-ACETAMINOPHEN 5-325 MG PO TABS: 2.0000 | ORAL_TABLET | ORAL | 0 refills | Status: DC | PRN

## 2017-09-10 MED ORDER — PROMETHAZINE HCL 25 MG RE SUPP: 25.0000 mg | Freq: Four times a day (QID) | RECTAL | 0 refills | Status: DC | PRN

## 2017-09-10 MED ORDER — PROMETHAZINE HCL 25 MG/ML IJ SOLN
25.0000 mg | Freq: Once | INTRAMUSCULAR | Status: AC
  Administered 2017-09-10: 25 mg via INTRAVENOUS
  Filled 2017-09-10: qty 1

## 2017-09-10 MED ORDER — FAMOTIDINE IN NACL 20-0.9 MG/50ML-% IV SOLN
20.0000 mg | Freq: Two times a day (BID) | INTRAVENOUS | Status: DC
  Administered 2017-09-10: 20 mg via INTRAVENOUS

## 2017-09-10 MED ORDER — POTASSIUM CHLORIDE 10 MEQ/100ML IV SOLN
10.0000 meq | Freq: Once | INTRAVENOUS | Status: AC
  Administered 2017-09-10: 10 meq via INTRAVENOUS
  Filled 2017-09-10: qty 100

## 2017-09-10 MED ORDER — SODIUM CHLORIDE 0.9 % IV SOLN
1000.0000 mL | INTRAVENOUS | Status: DC
  Administered 2017-09-10: 1000 mL via INTRAVENOUS

## 2017-09-10 MED ORDER — FAMOTIDINE IN NACL 20-0.9 MG/50ML-% IV SOLN
20.0000 mg | Freq: Two times a day (BID) | INTRAVENOUS | Status: DC
  Filled 2017-09-10: qty 50

## 2017-09-10 MED ORDER — OXYCODONE HCL 5 MG PO TABS: 30.0000 mg | ORAL_TABLET | Freq: Once | ORAL | Status: DC

## 2017-09-10 MED ORDER — LACTATED RINGERS IV BOLUS
1000.0000 mL | Freq: Once | INTRAVENOUS | Status: AC
  Administered 2017-09-10: 1000 mL via INTRAVENOUS

## 2017-09-10 MED ORDER — HYDROMORPHONE HCL 1 MG/ML IJ SOLN
1.0000 mg | Freq: Once | INTRAMUSCULAR | Status: AC
Start: 2017-09-10 — End: 2017-09-10
  Administered 2017-09-10: 1 mg via INTRAVENOUS
  Filled 2017-09-10: qty 1

## 2017-09-10 NOTE — ED Notes (Signed)
Pt continues to sleep; snoring at times; resp even/unlabored; Oxy IR held at this time.

## 2017-09-10 NOTE — ED Triage Notes (Addendum)
Pt c/o abd pain with n/v x 1 day HX pancreatitis Pt states he is out of his oxicodone

## 2017-09-10 NOTE — ED Provider Notes (Signed)
[name] Provider Note   CSN: [id number] Arrival date & time: 09/10/17  1450     History   Chief Complaint Chief Complaint  Patient presents with  . Abdominal Pain    HPI [name] is a 42 y.o. male.  HPI Patient reports he started vomiting quite acutely at 9 PM last night.  He started getting epigastric pain.  It is severe and aching in nature.  No diarrhea.  Normal bowel movement yesterday.  He did not count how many times he vomited.  Patient reports he has had pancreatitis in the past.  This is somewhat similar.  He does take oxycodone for pain chronically.  He reports he ran out yesterday.  He reports however he would not going to withdrawal this quickly and this does not seem to be withdrawal.  Reports subjective fever yesterday. Past Medical History:  Diagnosis Date  . Chronic abdominal pain   . Kidney stones   . Kidney stones   . Pancreatitis   . Pancreatitis, chronic ([place]) 05/28/2005    There are no active problems to display for this patient.   Past Surgical History:  Procedure Laterality Date  . APPENDECTOMY    . CHOLECYSTECTOMY    . ERCP W/ METAL STENT PLACEMENT    . KIDNEY STONE SURGERY          Home Medications    Prior to Admission medications   Medication Sig Start Date End Date Taking? Authorizing Provider  LORazepam (ATIVAN PO) Take by mouth.    [provider]  ondansetron (ZOFRAN ODT) 4 MG disintegrating tablet Take 1 tablet (4 mg total) by mouth every 4 (four) hours as needed for nausea or vomiting. 09/10/17   Charlesetta Shanks, MD  oxycodone (ROXICODONE) 30 MG immediate release tablet Take 30 mg by mouth every 6 (six) hours as needed for pain.    [provider]  oxyCODONE-acetaminophen (PERCOCET) 5-325 MG tablet Take 2 tablets by mouth every 4 (four) hours as needed. 09/10/17   Charlesetta Shanks, MD  promethazine (PHENERGAN) 25 MG suppository Place 1 suppository (25 mg total) rectally every 6  (six) hours as needed for nausea or vomiting. 09/10/17   Charlesetta Shanks, MD  topiramate (TOPAMAX) 25 MG tablet Take 25 mg by mouth 2 (two) times daily.    [provider]    Family History Family History  Problem Relation Age of Onset  . Cancer Mother   . Heart failure Father     Social History Social History   Tobacco Use  . Smoking status: Current Every Day Smoker    Packs/day: 0.50    Types: Cigarettes  . Smokeless tobacco: Never Used  Substance Use Topics  . Alcohol use: Yes    Comment: rarely, pt stated that he drinks once ever 3 months and it is usually a beer at dinner  . Drug use: No     Allergies   Cortisone; Eggs or egg-derived products; Iodinated diagnostic agents; Ketorolac tromethamine; and Haldol [haloperidol lactate]   Review of Systems Review of Systems 10 Systems reviewed and are negative for acute change except as noted in the HPI.   Physical Exam Updated Vital Signs BP (!) 135/100   Pulse 88   Temp 98.3 F (36.8 C) (Oral)   Resp 18   Ht 6\' 1"  (1.854 m)   Wt 113.4 kg (250 lb)   SpO2 98%   BMI 32.98 kg/m   Physical Exam  Constitutional: He is oriented to  person, place, and time.  Patient is well-nourished well-developed.  He is nontoxic.  He appears very uncomfortable.  He is prone on the stretcher and then lying on his side.  HENT:  Mucous membranes dry.  Posterior oropharynx widely patent.  Eyes: Pupils are equal, round, and reactive to light. EOM are normal.  Neck: Neck supple.  Cardiovascular:  Tachycardia.  No gross rub murmur gallop.  Pulmonary/Chest: Effort normal and breath sounds normal.  Abdominal:  Hyperactive bowel sounds.  Epigastrium and upper quadrants tender without guarding.  No distention.  Musculoskeletal: Normal range of motion. He exhibits no edema or tenderness.  Neurological: He is alert and oriented to person, place, and time. No cranial nerve deficit. He exhibits normal muscle tone. Coordination normal.    Skin: Skin is warm and dry.  Psychiatric:  Patient is uncomfortable and anxious in appearance.     ED Treatments / Results  Labs (all labs ordered are listed, but only abnormal results are displayed) Labs Reviewed  URINALYSIS, ROUTINE W REFLEX MICROSCOPIC - Abnormal; Notable for the following components:      Result Value   Hgb urine dipstick TRACE (*)    All other components within normal limits  URINALYSIS, MICROSCOPIC (REFLEX) - Abnormal; Notable for the following components:   Bacteria, UA RARE (*)    Squamous Epithelial / LPF 0-5 (*)    All other components within normal limits  COMPREHENSIVE METABOLIC PANEL - Abnormal; Notable for the following components:   Potassium 3.1 (*)    CO2 19 (*)    Glucose, Bld 144 (*)    All other components within normal limits  CBC WITH DIFFERENTIAL/PLATELET - Abnormal; Notable for the following components:   WBC 23.5 (*)    MCHC 36.1 (*)    Neutro Abs 20.5 (*)    Monocytes Absolute 1.4 (*)    All other components within normal limits  RAPID URINE DRUG SCREEN, HOSP PERFORMED - Abnormal; Notable for the following components:   Benzodiazepines POSITIVE (*)    All other components within normal limits  LIPASE, BLOOD    EKG None  Radiology Ct Abdomen Pelvis Wo Contrast  Result Date: 09/10/2017 CLINICAL DATA:  Generalized abdominal pain, nausea, and vomiting for 1 day. History of chronic pancreatitis. EXAM: CT ABDOMEN AND PELVIS WITHOUT CONTRAST TECHNIQUE: Multidetector CT imaging of the abdomen and pelvis was performed following the standard protocol without IV contrast. COMPARISON:  12/06/2015 FINDINGS: Lower chest: Subsegmental atelectasis in the lung bases. No pleural effusion. Normal heart size. Hepatobiliary: Hepatic steatosis. No significant biliary dilatation status post cholecystectomy. Pancreas: Unremarkable. Spleen: Unremarkable. Adrenals/Urinary Tract: Unremarkable adrenal glands. Unchanged 4 mm calculus in the interpolar left  kidney. No hydronephrosis. Unremarkable bladder. Stomach/Bowel: The stomach is within normal limits allowing for underdistention. There is no evidence of bowel obstruction or inflammation. The appendix is surgically absent. Vascular/Lymphatic: Minimal aortic atherosclerosis without aneurysm. No enlarged lymph nodes. Reproductive: Prostatic calcification without significant enlargement. Other: No intraperitoneal free fluid. Musculoskeletal: No acute osseous abnormality or suspicious osseous lesion. IMPRESSION: 1. No acute abnormality identified in the abdomen or pelvis. 2. Unchanged nonobstructing left renal stone. Electronically Signed   By: Logan Bores M.D.   On: 09/10/2017 19:11   Dg Chest 2 View  Result Date: 09/10/2017 CLINICAL DATA:  Chest pain EXAM: CHEST - 2 VIEW COMPARISON:  05/04/2014 FINDINGS: Mild chronic appearing bronchitic changes. No acute consolidation or effusion. Normal heart size. No pneumothorax. Surgical clips in the upper abdomen. IMPRESSION: No active cardiopulmonary disease.  Mild chronic bronchitic changes. Electronically Signed   By: Donavan Foil M.D.   On: 09/10/2017 01:35   Dg Abd Acute W/chest  Result Date: 09/10/2017 CLINICAL DATA:  42 y/o M; abdominal pain with nausea and vomiting for 1 day. History of pancreatitis. EXAM: DG ABDOMEN ACUTE W/ 1V CHEST COMPARISON:  09/10/2017 chest radiograph. 12/06/2015 CT abdomen and pelvis. FINDINGS: There is no evidence of dilated bowel loops or free intraperitoneal air. No radiopaque calculi or other significant radiographic abnormality is seen. Heart size and mediastinal contours are within normal limits. Both lungs are clear. Right upper quadrant cholecystectomy clips. Mild dextrocurvature of the lumbar spine. IMPRESSION: Negative abdominal radiographs.  No acute cardiopulmonary disease. Electronically Signed   By: Kristine Garbe M.D.   On: 09/10/2017 17:24    Procedures Procedures (including critical care  time)  Medications Ordered in ED Medications  sodium chloride 0.9 % bolus 1,000 mL (0 mLs Intravenous Stopped 09/10/17 1745)    Followed by  sodium chloride 0.9 % bolus 1,000 mL (0 mLs Intravenous Stopped 09/10/17 1804)    Followed by  0.9 %  sodium chloride infusion (1,000 mLs Intravenous New Bag/Given 09/10/17 1658)  famotidine (PEPCID) IVPB 20 mg premix (0 mg Intravenous Stopped 09/10/17 1746)  oxyCODONE (Oxy IR/ROXICODONE) immediate release tablet 30 mg (0 mg Oral Hold 09/10/17 1818)  promethazine (PHENERGAN) injection 25 mg (25 mg Intravenous Given 09/10/17 1644)  HYDROmorphone (DILAUDID) injection 1 mg (1 mg Intravenous Given 09/10/17 1648)  lactated ringers bolus 1,000 mL (0 mLs Intravenous Stopped 09/10/17 1853)  potassium chloride 10 mEq in 100 mL IVPB (0 mEq Intravenous Stopped 09/10/17 1911)     Initial Impression / Assessment and Plan / ED Course  I have reviewed the triage vital signs and the nursing notes.  Pertinent labs & imaging results that were available during my care of the patient were reviewed by me and considered in my medical decision making (see chart for details).     Final Clinical Impressions(s) / ED Diagnoses   Final diagnoses:  Epigastric pain  Non-intractable vomiting with nausea, unspecified vomiting type   Patient did have significant leukocytosis.  CT does not identify any acute findings.  Patient's pain is predominantly epigastric and upper abdominal.  He reports this is been the case with his pancreatitis.  At this time, there is no inflammatory change or lipase elevation.  Sideration is for gastritis with opioid withdrawal.  Patient symptoms have been controlled with rehydration, antiemetic and pain control.  Patient reports his prescription was dispensed 1 day short from his refill date tomorrow.  Patient will be able to refill in the morning.  He is given Percocet to take overnight for withdrawal.  He is also advised to talk to his family doctor and pain  management specialist regarding this discrepancy in the prescription.  Return precautions are reviewed. ED Discharge Orders        Ordered    oxyCODONE-acetaminophen (PERCOCET) 5-325 MG tablet  Every 4 hours PRN     09/10/17 2003    ondansetron (ZOFRAN ODT) 4 MG disintegrating tablet  Every 4 hours PRN     09/10/17 2003    promethazine (PHENERGAN) 25 MG suppository  Every 6 hours PRN     09/10/17 Tama Gander, MD 09/10/17 2007

## 2017-09-10 NOTE — ED Notes (Signed)
Patient transported to CT 

## 2017-09-10 NOTE — ED Notes (Signed)
Pt to xray

## 2017-09-10 NOTE — ED Notes (Signed)
Pt updated on wait time; pt unable to stay due to wait time; strongly encouraged pt to follow up with PCP. Pt and wife left

## 2017-09-10 NOTE — Discharge Instructions (Signed)
1.  Must call your doctor first thing in the morning for refill of your pain medications. 2.  Zofran and Phenergan as needed for nausea and vomiting. 3.  Return to the emergency department if worsening or changing symptoms or other concerns.

## 2017-09-10 NOTE — ED Triage Notes (Signed)
Pt started having chest pain early today that resolved on its on. Pain started again while pt was at work with weakness. Pain decribed as "a fist in the middle of my chest with an elephant sitting on that fist." Reports difficulty catching his breath. Appears pale and uncomfortable in triage

## 2017-09-10 NOTE — ED Notes (Signed)
Pt seen at [facility] ED last night for same , left AMA

## 2017-11-05 ENCOUNTER — Emergency Department (HOSPITAL_BASED_OUTPATIENT_CLINIC_OR_DEPARTMENT_OTHER)
Admission: EM | Admit: 2017-11-05 | Discharge: 2017-11-05 | Disposition: A | Discharge status: Home / Self Care | Payer: Commercial Managed Care - PPO | Attending: Emergency Medicine | Admitting: Emergency Medicine

## 2017-11-05 ENCOUNTER — Other Ambulatory Visit: Payer: Self-pay

## 2017-11-05 ENCOUNTER — Emergency Department (HOSPITAL_COMMUNITY)
Admission: EM | Admit: 2017-11-05 | Discharge: 2017-11-05 | Disposition: A | Discharge status: Home / Self Care | Payer: Self-pay | Attending: Emergency Medicine | Admitting: Emergency Medicine

## 2017-11-05 ENCOUNTER — Encounter (HOSPITAL_BASED_OUTPATIENT_CLINIC_OR_DEPARTMENT_OTHER): Payer: Self-pay | Admitting: *Deleted

## 2017-11-05 ENCOUNTER — Encounter (HOSPITAL_COMMUNITY): Payer: Self-pay | Admitting: *Deleted

## 2017-11-05 DIAGNOSIS — R109 Unspecified abdominal pain: Secondary | ICD-10-CM | POA: Insufficient documentation

## 2017-11-05 DIAGNOSIS — Z79899 Other long term (current) drug therapy: Secondary | ICD-10-CM | POA: Insufficient documentation

## 2017-11-05 DIAGNOSIS — F17210 Nicotine dependence, cigarettes, uncomplicated: Secondary | ICD-10-CM | POA: Insufficient documentation

## 2017-11-05 DIAGNOSIS — R1012 Left upper quadrant pain: Secondary | ICD-10-CM | POA: Insufficient documentation

## 2017-11-05 DIAGNOSIS — Z5321 Procedure and treatment not carried out due to patient leaving prior to being seen by health care provider: Secondary | ICD-10-CM | POA: Insufficient documentation

## 2017-11-05 DIAGNOSIS — R112 Nausea with vomiting, unspecified: Secondary | ICD-10-CM | POA: Insufficient documentation

## 2017-11-05 DIAGNOSIS — G8929 Other chronic pain: Secondary | ICD-10-CM | POA: Insufficient documentation

## 2017-11-05 LAB — CBC WITH DIFFERENTIAL/PLATELET
BASOS ABS: 0 10*3/uL (ref 0.0–0.1)
Basophils Relative: 0 %
Eosinophils Absolute: 0.1 10*3/uL (ref 0.0–0.7)
Eosinophils Relative: 1 %
HEMATOCRIT: 43.9 % (ref 39.0–52.0)
Hemoglobin: 14.8 g/dL (ref 13.0–17.0)
LYMPHS PCT: 19 %
Lymphs Abs: 1.9 10*3/uL (ref 0.7–4.0)
MCH: 29.4 pg (ref 26.0–34.0)
MCHC: 33.7 g/dL (ref 30.0–36.0)
MCV: 87.1 fL (ref 78.0–100.0)
MONO ABS: 0.9 10*3/uL (ref 0.1–1.0)
MONOS PCT: 9 %
NEUTROS ABS: 7.2 10*3/uL (ref 1.7–7.7)
Neutrophils Relative %: 71 %
Platelets: 206 10*3/uL (ref 150–400)
RBC: 5.04 MIL/uL (ref 4.22–5.81)
RDW: 12.9 % (ref 11.5–15.5)
WBC: 10.1 10*3/uL (ref 4.0–10.5)

## 2017-11-05 LAB — URINALYSIS, ROUTINE W REFLEX MICROSCOPIC
BACTERIA UA: NONE SEEN
Bilirubin Urine: NEGATIVE
Glucose, UA: NEGATIVE mg/dL
KETONES UR: NEGATIVE mg/dL
LEUKOCYTES UA: NEGATIVE
Nitrite: NEGATIVE
PH: 5 (ref 5.0–8.0)
PROTEIN: NEGATIVE mg/dL
Specific Gravity, Urine: 1.01 (ref 1.005–1.030)

## 2017-11-05 LAB — LIPASE, BLOOD: LIPASE: 20 U/L (ref 11–51)

## 2017-11-05 LAB — RAPID URINE DRUG SCREEN, HOSP PERFORMED
Amphetamines: NOT DETECTED
BENZODIAZEPINES: NOT DETECTED
Barbiturates: NOT DETECTED
COCAINE: NOT DETECTED
OPIATES: POSITIVE — AB
Tetrahydrocannabinol: NOT DETECTED

## 2017-11-05 LAB — ETHANOL: Alcohol, Ethyl (B): <10 mg/dL (ref <10)

## 2017-11-05 LAB — COMPREHENSIVE METABOLIC PANEL
ALBUMIN: 4.1 g/dL (ref 3.5–5.0)
ALT: 23 U/L (ref 17–63)
AST: 24 U/L (ref 15–41)
Alkaline Phosphatase: 49 U/L (ref 38–126)
Anion gap: 8 (ref 5–15)
BUN: 12 mg/dL (ref 6–20)
CHLORIDE: 108 mmol/L (ref 101–111)
CO2: 24 mmol/L (ref 22–32)
CREATININE: 0.83 mg/dL (ref 0.61–1.24)
Calcium: 9.4 mg/dL (ref 8.9–10.3)
GFR calc Af Amer: >60 mL/min (ref >60)
GFR calc non Af Amer: >60 mL/min (ref >60)
Glucose, Bld: 127 mg/dL — ABNORMAL HIGH (ref 65–99)
Potassium: 3.8 mmol/L (ref 3.5–5.1)
SODIUM: 140 mmol/L (ref 135–145)
Total Bilirubin: 0.6 mg/dL (ref 0.3–1.2)
Total Protein: 7.2 g/dL (ref 6.5–8.1)

## 2017-11-05 MED ORDER — ONDANSETRON HCL 4 MG PO TABS: 4.0000 mg | ORAL_TABLET | Freq: Three times a day (TID) | ORAL | 0 refills | Status: DC | PRN

## 2017-11-05 MED ORDER — FENTANYL CITRATE (PF) 100 MCG/2ML IJ SOLN
50.0000 ug | Freq: Once | INTRAMUSCULAR | Status: AC
  Administered 2017-11-05: 50 ug via INTRAVENOUS
  Filled 2017-11-05: qty 2

## 2017-11-05 MED ORDER — SODIUM CHLORIDE 0.9 % IV BOLUS
1000.0000 mL | Freq: Once | INTRAVENOUS | Status: AC
  Administered 2017-11-05: 1000 mL via INTRAVENOUS

## 2017-11-05 MED ORDER — DICYCLOMINE HCL 10 MG/ML IM SOLN
20.0000 mg | Freq: Once | INTRAMUSCULAR | Status: AC
  Administered 2017-11-05: 20 mg via INTRAMUSCULAR
  Filled 2017-11-05: qty 2

## 2017-11-05 MED ORDER — DICYCLOMINE HCL 20 MG PO TABS: 20.0000 mg | ORAL_TABLET | Freq: Three times a day (TID) | ORAL | 0 refills | Status: DC

## 2017-11-05 MED ORDER — ONDANSETRON HCL 4 MG/2ML IJ SOLN
4.0000 mg | Freq: Once | INTRAMUSCULAR | Status: AC
  Administered 2017-11-05: 4 mg via INTRAVENOUS
  Filled 2017-11-05: qty 2

## 2017-11-05 NOTE — Discharge Instructions (Addendum)
Drink plenty of fluids. Use the zofran for nausea or vomiting and the bentyl for your chronic abdominal pain. You have pain medications to take at home. Follow up with Dr Melina Copa or Dr Laural Golden as needed.

## 2017-11-05 NOTE — ED Triage Notes (Signed)
Pt c/o abd pain x 3 hrs HX pancreatitis

## 2017-11-05 NOTE — ED Triage Notes (Signed)
Pt c/o n/v and abdominal pain that started yesterday; pt states it may be related to a pancreatic attack; pt states he has a hx of pancreatitis

## 2017-11-05 NOTE — ED Provider Notes (Signed)
[facility] EMERGENCY DEPARTMENT Provider Note   CSN: [id number] Arrival date & time: 11/05/17  5427  Time seen 04:44 AM   History   Chief Complaint Chief Complaint  Patient presents with  . Abdominal Pain    HPI [name] is a 42 y.o. male.  HPI patient states he has chronic abdominal pain from pancreatitis.  He states about 3:30 PM on June 10 he started getting epigastric pain going into his left upper quadrant and into his back.  He states he has had nausea and vomiting about 7-8 times however he has not had any in several hours.  He denies any hematemesis.  He denies diarrhea or constipation.  He states his feels like his chronic pancreatitis pain.  He states he did not eat anything different, he denies any alcohol use.  He states he has not run out of his chronic pain medication.  He states he is followed by Dr. Laural Golden, gastroenterology and had a endoscopy a year ago which he states showed possible damage to the ducts  PCP Octavio Graves, DO GI Dr Laural Golden  Past Medical History:  Diagnosis Date  . Chronic abdominal pain   . Kidney stones   . Kidney stones   . Pancreatitis   . Pancreatitis, chronic ([place]) 05/28/2005    There are no active problems to display for this patient.   Past Surgical History:  Procedure Laterality Date  . APPENDECTOMY    . CHOLECYSTECTOMY    . ERCP W/ METAL STENT PLACEMENT    . KIDNEY STONE SURGERY          Home Medications    Prior to Admission medications   Medication Sig Start Date End Date Taking? Authorizing Provider  dicyclomine (BENTYL) 20 MG tablet Take 1 tablet (20 mg total) by mouth 4 (four) times daily -  before meals and at bedtime. 11/05/17   Rolland Porter, MD  LORazepam (ATIVAN PO) Take by mouth.    [provider]  ondansetron (ZOFRAN ODT) 4 MG disintegrating tablet Take 1 tablet (4 mg total) by mouth every 4 (four) hours as needed for nausea or vomiting. 09/10/17   Charlesetta Shanks, MD  ondansetron (ZOFRAN) 4 MG  tablet Take 1 tablet (4 mg total) by mouth every 8 (eight) hours as needed. 11/05/17   Rolland Porter, MD  oxycodone (ROXICODONE) 30 MG immediate release tablet Take 30 mg by mouth every 6 (six) hours as needed for pain.    [provider]  oxyCODONE-acetaminophen (PERCOCET) 5-325 MG tablet Take 2 tablets by mouth every 4 (four) hours as needed. 09/10/17   Charlesetta Shanks, MD  promethazine (PHENERGAN) 25 MG suppository Place 1 suppository (25 mg total) rectally every 6 (six) hours as needed for nausea or vomiting. 09/10/17   Charlesetta Shanks, MD  topiramate (TOPAMAX) 25 MG tablet Take 25 mg by mouth 2 (two) times daily.    [provider]    Family History Family History  Problem Relation Age of Onset  . Cancer Mother   . Heart failure Father     Social History Social History   Tobacco Use  . Smoking status: Current Every Day Smoker    Packs/day: 0.50    Types: Cigarettes  . Smokeless tobacco: Never Used  Substance Use Topics  . Alcohol use: Yes    Comment: rarely, pt stated that he drinks once ever 3 months and it is usually a beer at dinner  . Drug use: No  [place] fired from [place] for [place] the job he states b/o medications   Allergies   Cortisone; Eggs or egg-derived products; Iodinated diagnostic agents; Ketorolac tromethamine; and Haldol [haloperidol lactate]   Review of Systems Review of Systems  All other systems reviewed and are negative.    Physical Exam Updated Vital Signs BP (!) 133/95 (BP Location: Right Arm)   Pulse 89   Temp 99.8 F (37.7 C) (Oral)   Resp 20   Ht 6\' 1"  (1.854 m)   Wt 88.5 kg (195 lb)   SpO2 98%   BMI 25.73 kg/m   Vital signs normal    Physical Exam  Constitutional: He is oriented to person, place, and time. He appears well-developed and well-nourished.  Non-toxic appearance. He does not appear ill. No distress.  HENT:  Head: Normocephalic and atraumatic.  Right Ear: External ear normal.  Left Ear:  External ear normal.  Nose: Nose normal. No mucosal edema or rhinorrhea.  Mouth/Throat: Oropharynx is clear and moist and mucous membranes are normal. No dental abscesses or uvula swelling.  Tongue is green from drinking green Gatorade  Eyes: Pupils are equal, round, and reactive to light. Conjunctivae and EOM are normal.  Neck: Normal range of motion and full passive range of motion without pain. Neck supple.  Cardiovascular: Normal rate, regular rhythm and normal heart sounds. Exam reveals no gallop and no friction rub.  No murmur heard. Pulmonary/Chest: Effort normal and breath sounds normal. No respiratory distress. He has no wheezes. He has no rhonchi. He has no rales. He exhibits no tenderness and no crepitus.  Abdominal: Soft. Normal appearance and bowel sounds are normal. He exhibits no distension. There is tenderness in the epigastric area and left upper quadrant. There is no rebound and no guarding.  Musculoskeletal: Normal range of motion. He exhibits no edema or tenderness.  Moves all extremities well.   Neurological: He is alert and oriented to person, place, and time. He has normal strength. No cranial nerve deficit.  Skin: Skin is warm, dry and intact. No rash noted. No erythema. There is pallor.  Psychiatric: He has a normal mood and affect. His speech is normal and behavior is normal. His mood appears not anxious.  Nursing note and vitals reviewed.    ED Treatments / Results  Labs (all labs ordered are listed, but only abnormal results are displayed)  Results for orders placed or performed during the hospital encounter of 11/05/17  Comprehensive metabolic panel  Result Value Ref Range   Sodium 140 135 - 145 mmol/L   Potassium 3.8 3.5 - 5.1 mmol/L   Chloride 108 101 - 111 mmol/L   CO2 24 22 - 32 mmol/L   Glucose, Bld 127 (H) 65 - 99 mg/dL   BUN 12 6 - 20 mg/dL   Creatinine, Ser 0.83 0.61 - 1.24 mg/dL   Calcium 9.4 8.9 - 10.3 mg/dL   Total Protein 7.2 6.5 - 8.1 g/dL     Albumin 4.1 3.5 - 5.0 g/dL   AST 24 15 - 41 U/L   ALT 23 17 - 63 U/L   Alkaline Phosphatase 49 38 - 126 U/L   Total Bilirubin 0.6 0.3 - 1.2 mg/dL   GFR calc non Af Amer >60 >60 mL/min   GFR calc Af Amer >60 >60 mL/min   Anion gap 8 5 - 15  Ethanol  Result Value Ref Range   Alcohol, Ethyl (B) <10 <10 mg/dL  Lipase, blood  Result Value Ref Range   Lipase  20 11 - 51 U/L  CBC with Differential  Result Value Ref Range   WBC 10.1 4.0 - 10.5 K/uL   RBC 5.04 4.22 - 5.81 MIL/uL   Hemoglobin 14.8 13.0 - 17.0 g/dL   HCT 43.9 39.0 - 52.0 %   MCV 87.1 78.0 - 100.0 fL   MCH 29.4 26.0 - 34.0 pg   MCHC 33.7 30.0 - 36.0 g/dL   RDW 12.9 11.5 - 15.5 %   Platelets 206 150 - 400 K/uL   Neutrophils Relative % 71 %   Neutro Abs 7.2 1.7 - 7.7 K/uL   Lymphocytes Relative 19 %   Lymphs Abs 1.9 0.7 - 4.0 K/uL   Monocytes Relative 9 %   Monocytes Absolute 0.9 0.1 - 1.0 K/uL   Eosinophils Relative 1 %   Eosinophils Absolute 0.1 0.0 - 0.7 K/uL   Basophils Relative 0 %   Basophils Absolute 0.0 0.0 - 0.1 K/uL  Urinalysis, Routine w reflex microscopic  Result Value Ref Range   Color, Urine YELLOW YELLOW   APPearance CLEAR CLEAR   Specific Gravity, Urine 1.010 1.005 - 1.030   pH 5.0 5.0 - 8.0   Glucose, UA NEGATIVE NEGATIVE mg/dL   Hgb urine dipstick MODERATE (A) NEGATIVE   Bilirubin Urine NEGATIVE NEGATIVE   Ketones, ur NEGATIVE NEGATIVE mg/dL   Protein, ur NEGATIVE NEGATIVE mg/dL   Nitrite NEGATIVE NEGATIVE   Leukocytes, UA NEGATIVE NEGATIVE   RBC / HPF 11-20 0 - 5 RBC/hpf   WBC, UA 0-5 0 - 5 WBC/hpf   Bacteria, UA NONE SEEN NONE SEEN   Mucus PRESENT   Urine rapid drug screen (hosp performed)  Result Value Ref Range   Opiates POSITIVE (A) NONE DETECTED   Cocaine NONE DETECTED NONE DETECTED   Benzodiazepines NONE DETECTED NONE DETECTED   Amphetamines NONE DETECTED NONE DETECTED   Tetrahydrocannabinol NONE DETECTED NONE DETECTED   Barbiturates NONE DETECTED NONE DETECTED   Laboratory  interpretation all normal except persistent microscopic hematuria, positive drug screen consistent with his medications   EKG None  Radiology No results found.   CLINICAL DATA:  Generalized abdominal pain, nausea, and vomiting for 1 day. History of chronic pancreatitis.  EXAM: CT ABDOMEN AND PELVIS WITHOUT CONTRAST  TECHNIQUE: Multidetector CT imaging of the abdomen and pelvis was performed following the standard protocol without IV contrast.  COMPARISON:  12/06/2015   IMPRESSION: 1. No acute abnormality identified in the abdomen or pelvis. 2. Unchanged nonobstructing left renal stone.   Electronically Signed   By: Logan Bores M.D.   On: 09/10/2017 19:11    Procedures Procedures (including critical care time)  Medications Ordered in ED Medications  sodium chloride 0.9 % bolus 1,000 mL (0 mLs Intravenous Stopped 11/05/17 0623)  sodium chloride 0.9 % bolus 1,000 mL (0 mLs Intravenous Stopped 11/05/17 0623)  ondansetron (ZOFRAN) injection 4 mg (4 mg Intravenous Given 11/05/17 0516)  fentaNYL (SUBLIMAZE) injection 50 mcg (50 mcg Intravenous Given 11/05/17 0516)  dicyclomine (BENTYL) injection 20 mg (20 mg Intramuscular Given 11/05/17 0515)     Initial Impression / Assessment and Plan / ED Course  I have reviewed the triage vital signs and the nursing notes.  Pertinent labs & imaging results that were available during my care of the patient were reviewed by me and considered in my medical decision making (see chart for details).      Patient was given IV fluids and IV pain medicine, when I review his records from [facility] he  was usually prescribed Bentyl for his pain.  He was given Bentyl IM.  Recheck at time of discharge patient states he is feeling better.  Wife states he appears to be much improved. He states he has not run out of his pain medication.  He was discharged home.    Review of the [place] shows patient gets #60 OxyContin  80 mg tablets monthly through May 17 prescribed by his PCP, he also gets varying amounts of oxycodone 15 mg tablets varying between 30 tablets, 60 tablets, 40 tablets, 28 tablets all prescribed by his PCP about every 3 weeks.  He got # 30 tablets on June 6.  He gets #60 lorazepam 0.5 mg tablets monthly, last filled May 14.  Final Clinical Impressions(s) / ED Diagnoses   Final diagnoses:  Chronic abdominal pain  Non-intractable vomiting with nausea, unspecified vomiting type    ED Discharge Orders        Ordered    ondansetron (ZOFRAN) 4 MG tablet  Every 8 hours PRN     11/05/17 0623    dicyclomine (BENTYL) 20 MG tablet  3 times daily before meals & bedtime     11/05/17 9191      Plan discharge  Rolland Porter, MD, Barbette Or, MD 11/05/17 [phone]

## 2017-11-05 NOTE — ED Notes (Signed)
After triage pt asked " how long is the wait" , instructed pt there was several people ahead of him, pt states " im leaving then" seen leaving the lobby

## 2017-11-05 NOTE — ED Notes (Signed)
Dc IV. 

## 2017-11-10 ENCOUNTER — Other Ambulatory Visit: Payer: Self-pay

## 2017-11-10 ENCOUNTER — Encounter (HOSPITAL_BASED_OUTPATIENT_CLINIC_OR_DEPARTMENT_OTHER): Payer: Self-pay | Admitting: Emergency Medicine

## 2017-11-10 ENCOUNTER — Emergency Department (HOSPITAL_BASED_OUTPATIENT_CLINIC_OR_DEPARTMENT_OTHER)
Admission: EM | Admit: 2017-11-10 | Discharge: 2017-11-11 | Disposition: A | Discharge status: Home / Self Care | Payer: Self-pay | Attending: Emergency Medicine | Admitting: Emergency Medicine

## 2017-11-10 DIAGNOSIS — F17210 Nicotine dependence, cigarettes, uncomplicated: Secondary | ICD-10-CM | POA: Insufficient documentation

## 2017-11-10 DIAGNOSIS — G8929 Other chronic pain: Secondary | ICD-10-CM | POA: Insufficient documentation

## 2017-11-10 DIAGNOSIS — Z79899 Other long term (current) drug therapy: Secondary | ICD-10-CM | POA: Insufficient documentation

## 2017-11-10 DIAGNOSIS — R112 Nausea with vomiting, unspecified: Secondary | ICD-10-CM | POA: Insufficient documentation

## 2017-11-10 DIAGNOSIS — R109 Unspecified abdominal pain: Secondary | ICD-10-CM | POA: Insufficient documentation

## 2017-11-10 LAB — COMPREHENSIVE METABOLIC PANEL
ALBUMIN: 4.2 g/dL (ref 3.5–5.0)
ALT: 17 U/L (ref 17–63)
ANION GAP: 9 (ref 5–15)
AST: 23 U/L (ref 15–41)
Alkaline Phosphatase: 53 U/L (ref 38–126)
BUN: 19 mg/dL (ref 6–20)
CO2: 20 mmol/L — ABNORMAL LOW (ref 22–32)
Calcium: 9.1 mg/dL (ref 8.9–10.3)
Chloride: 113 mmol/L — ABNORMAL HIGH (ref 101–111)
Creatinine, Ser: 1.16 mg/dL (ref 0.61–1.24)
GFR calc Af Amer: >60 mL/min (ref >60)
GFR calc non Af Amer: >60 mL/min (ref >60)
GLUCOSE: 111 mg/dL — AB (ref 65–99)
POTASSIUM: 3.2 mmol/L — AB (ref 3.5–5.1)
SODIUM: 142 mmol/L (ref 135–145)
TOTAL PROTEIN: 6.9 g/dL (ref 6.5–8.1)
Total Bilirubin: 0.3 mg/dL (ref 0.3–1.2)

## 2017-11-10 LAB — CBC WITH DIFFERENTIAL/PLATELET
BASOS ABS: 0.1 10*3/uL (ref 0.0–0.1)
BASOS PCT: 0 %
Eosinophils Absolute: 0.2 10*3/uL (ref 0.0–0.7)
Eosinophils Relative: 1 %
HEMATOCRIT: 41.5 % (ref 39.0–52.0)
HEMOGLOBIN: 15 g/dL (ref 13.0–17.0)
LYMPHS PCT: 20 %
Lymphs Abs: 2.8 10*3/uL (ref 0.7–4.0)
MCH: 30.4 pg (ref 26.0–34.0)
MCHC: 36.1 g/dL — AB (ref 30.0–36.0)
MCV: 84 fL (ref 78.0–100.0)
MONO ABS: 1.6 10*3/uL — AB (ref 0.1–1.0)
MONOS PCT: 12 %
NEUTROS ABS: 9 10*3/uL — AB (ref 1.7–7.7)
NEUTROS PCT: 67 %
Platelets: 231 10*3/uL (ref 150–400)
RBC: 4.94 MIL/uL (ref 4.22–5.81)
RDW: 13.2 % (ref 11.5–15.5)
WBC: 13.7 10*3/uL — ABNORMAL HIGH (ref 4.0–10.5)

## 2017-11-10 LAB — LIPASE, BLOOD: Lipase: 46 U/L (ref 11–51)

## 2017-11-10 MED ORDER — SODIUM CHLORIDE 0.9 % IV BOLUS
1000.0000 mL | Freq: Once | INTRAVENOUS | Status: AC
  Administered 2017-11-10: 1000 mL via INTRAVENOUS

## 2017-11-10 MED ORDER — POTASSIUM CHLORIDE CRYS ER 20 MEQ PO TBCR
40.0000 meq | EXTENDED_RELEASE_TABLET | Freq: Once | ORAL | Status: AC
Start: 2017-11-10 — End: 2017-11-11
  Administered 2017-11-11: 40 meq via ORAL
  Filled 2017-11-10: qty 2

## 2017-11-10 MED ORDER — HYDROMORPHONE HCL 1 MG/ML IJ SOLN
1.0000 mg | Freq: Once | INTRAMUSCULAR | Status: AC
  Administered 2017-11-10: 1 mg via INTRAVENOUS
  Filled 2017-11-10: qty 1

## 2017-11-10 MED ORDER — PROMETHAZINE HCL 25 MG/ML IJ SOLN
25.0000 mg | Freq: Once | INTRAMUSCULAR | Status: AC
  Administered 2017-11-10: 25 mg via INTRAVENOUS
  Filled 2017-11-10: qty 1

## 2017-11-10 NOTE — ED Provider Notes (Signed)
[facility] DEPT [facility] Provider Note: Georgena Spurling, MD, FACEP  CSN: [id number] MRN: [id number] ARRIVAL: 11/10/17 at 2056 ROOM: [place]  Abdominal Pain   HISTORY OF PRESENT ILLNESS  11/10/17 11:00 PM [name] is a 42 y.o. male with a history of chronic abdominal pain reportedly due to chronic pancreatitis.  He is on OxyContin ER 80 mg twice daily and oxycodone hydrochloride 15 mg for breakthrough pain.  He is not on pancreatic enzyme replacement.  He is here with a one-week history of an exacerbation of his pain along with nausea and vomiting.  The nausea and vomiting lasted about 3 days.  Since then he has been able to keep things on his stomach but the pain persists.  He does continue to be nauseated.  He took his last dose of OxyContin today and will be refilling his prescription tomorrow morning.  He understands he will not be receiving prescriptions for narcotics in the ED.  He describes his pain as epigastric, feels like a hot poker in his belly, and he rates it a 9 out of 10.  It is worse with movement or palpation.  It is making him feel agitated.   Past Medical History:  Diagnosis Date  . Chronic abdominal pain   . Kidney stones   . Pancreatitis, chronic ([place]) 05/28/2005    Past Surgical History:  Procedure Laterality Date  . APPENDECTOMY    . CHOLECYSTECTOMY    . ERCP W/ METAL STENT PLACEMENT    . KIDNEY STONE SURGERY      Family History  Problem Relation Age of Onset  . Cancer Mother   . Heart failure Father     Social History   Tobacco Use  . Smoking status: Current Every Day Smoker    Packs/day: 0.50    Types: Cigarettes  . Smokeless tobacco: Never Used  Substance Use Topics  . Alcohol use: Yes    Comment: rarely, pt stated that he drinks once ever 3 months and it is usually a beer at dinner  . Drug use: No    Prior to Admission medications   Medication Sig Start Date End Date Taking? Authorizing Provider  dicyclomine  (BENTYL) 20 MG tablet Take 1 tablet (20 mg total) by mouth 4 (four) times daily -  before meals and at bedtime. 11/05/17   Rolland Porter, MD  LORazepam (ATIVAN PO) Take by mouth.    [provider]  ondansetron (ZOFRAN ODT) 4 MG disintegrating tablet Take 1 tablet (4 mg total) by mouth every 4 (four) hours as needed for nausea or vomiting. 09/10/17   Charlesetta Shanks, MD  ondansetron (ZOFRAN) 4 MG tablet Take 1 tablet (4 mg total) by mouth every 8 (eight) hours as needed. 11/05/17   Rolland Porter, MD  oxycodone (ROXICODONE) 30 MG immediate release tablet Take 30 mg by mouth every 6 (six) hours as needed for pain.    [provider]  oxyCODONE-acetaminophen (PERCOCET) 5-325 MG tablet Take 2 tablets by mouth every 4 (four) hours as needed. 09/10/17   Charlesetta Shanks, MD  promethazine (PHENERGAN) 25 MG suppository Place 1 suppository (25 mg total) rectally every 6 (six) hours as needed for nausea or vomiting. 09/10/17   Charlesetta Shanks, MD  topiramate (TOPAMAX) 25 MG tablet Take 25 mg by mouth 2 (two) times daily.    [provider]    Allergies Cortisone; Eggs or egg-derived products; Iodinated diagnostic agents; Ketorolac tromethamine; and Haldol [haloperidol lactate]   REVIEW  OF SYSTEMS  Negative except as noted here or in the History of Present Illness.   PHYSICAL EXAMINATION  Initial Vital Signs Blood pressure (!) 156/94, pulse (!) 124, temperature 98 F (36.7 C), temperature source Oral, resp. rate (!) 22, height 6\' 1"  (1.854 m), weight 88.5 kg (195 lb), SpO2 98 %.  Examination General: Well-developed, well-nourished male in no acute distress; appearance consistent with age of record HENT: normocephalic; atraumatic Eyes: pupils equal, round and reactive to light; extraocular muscles intact Neck: supple Heart: regular rate and rhythm Lungs: clear to auscultation bilaterally Abdomen: soft; nondistended; epigastric tenderness; no masses or hepatosplenomegaly; bowel sounds  present Extremities: No deformity; full range of motion; pulses normal Neurologic: Awake, alert and oriented; motor function intact in all extremities and symmetric; no facial droop Skin: Warm and dry Psychiatric: Normal mood and affect   RESULTS  Summary of this visit's results, reviewed by myself:   EKG Interpretation  Date/Time:    Ventricular Rate:    PR Interval:    QRS Duration:   QT Interval:    QTC Calculation:   R Axis:     Text Interpretation:        Laboratory Studies: Results for orders placed or performed during the hospital encounter of 11/10/17 (from the past 24 hour(s))  Comprehensive metabolic panel     Status: Abnormal   Collection Time: 11/10/17 10:18 PM  Result Value Ref Range   Sodium 142 135 - 145 mmol/L   Potassium 3.2 (L) 3.5 - 5.1 mmol/L   Chloride 113 (H) 101 - 111 mmol/L   CO2 20 (L) 22 - 32 mmol/L   Glucose, Bld 111 (H) 65 - 99 mg/dL   BUN 19 6 - 20 mg/dL   Creatinine, Ser 1.16 0.61 - 1.24 mg/dL   Calcium 9.1 8.9 - 10.3 mg/dL   Total Protein 6.9 6.5 - 8.1 g/dL   Albumin 4.2 3.5 - 5.0 g/dL   AST 23 15 - 41 U/L   ALT 17 17 - 63 U/L   Alkaline Phosphatase 53 38 - 126 U/L   Total Bilirubin 0.3 0.3 - 1.2 mg/dL   GFR calc non Af Amer >60 >60 mL/min   GFR calc Af Amer >60 >60 mL/min   Anion gap 9 5 - 15  Lipase, blood     Status: None   Collection Time: 11/10/17 10:18 PM  Result Value Ref Range   Lipase 46 11 - 51 U/L  CBC with Differential     Status: Abnormal   Collection Time: 11/10/17 10:18 PM  Result Value Ref Range   WBC 13.7 (H) 4.0 - 10.5 K/uL   RBC 4.94 4.22 - 5.81 MIL/uL   Hemoglobin 15.0 13.0 - 17.0 g/dL   HCT 41.5 39.0 - 52.0 %   MCV 84.0 78.0 - 100.0 fL   MCH 30.4 26.0 - 34.0 pg   MCHC 36.1 (H) 30.0 - 36.0 g/dL   RDW 13.2 11.5 - 15.5 %   Platelets 231 150 - 400 K/uL   Neutrophils Relative % 67 %   Neutro Abs 9.0 (H) 1.7 - 7.7 K/uL   Lymphocytes Relative 20 %   Lymphs Abs 2.8 0.7 - 4.0 K/uL   Monocytes Relative 12 %    Monocytes Absolute 1.6 (H) 0.1 - 1.0 K/uL   Eosinophils Relative 1 %   Eosinophils Absolute 0.2 0.0 - 0.7 K/uL   Basophils Relative 0 %   Basophils Absolute 0.1 0.0 - 0.1 K/uL   Imaging Studies:  No results found.  ED COURSE and MDM  Nursing notes and initial vitals signs, including pulse oximetry, reviewed.  Vitals:   11/10/17 2110 11/10/17 2230 11/10/17 2300 11/11/17 0004  BP:  126/79 124/80 110/81  Pulse:  95 (!) 108 81  Resp:    16  Temp:      TempSrc:      SpO2:  97% 100% 97%  Weight: 88.5 kg (195 lb)     Height: 6\' 1"  (1.854 m)      12:37 AM Patient states he feels back to his baseline after IV fluids and medications.  As noted above he understands he will not be given any prescriptions for narcotics as he is already on chronic pain management for this chronic abdominal pain.  PROCEDURES    ED DIAGNOSES     ICD-10-CM   1. Chronic abdominal pain R10.9    G89.29        Marwa Fuhrman, Jenny Reichmann, MD 11/11/17 [phone]

## 2017-11-10 NOTE — ED Triage Notes (Signed)
Pt c/o n/v and abdominal pain that started earlier this week; pt states he has had a pancreatisis attack for the last week  - he has been vomiting and can not get his pain under control

## 2017-12-05 ENCOUNTER — Other Ambulatory Visit: Payer: Self-pay

## 2017-12-05 ENCOUNTER — Encounter (HOSPITAL_BASED_OUTPATIENT_CLINIC_OR_DEPARTMENT_OTHER): Payer: Self-pay | Admitting: Emergency Medicine

## 2017-12-05 ENCOUNTER — Emergency Department (HOSPITAL_BASED_OUTPATIENT_CLINIC_OR_DEPARTMENT_OTHER)
Admission: EM | Admit: 2017-12-05 | Discharge: 2017-12-05 | Disposition: A | Discharge status: Home / Self Care | Payer: Self-pay | Attending: Emergency Medicine | Admitting: Emergency Medicine

## 2017-12-05 ENCOUNTER — Emergency Department (HOSPITAL_BASED_OUTPATIENT_CLINIC_OR_DEPARTMENT_OTHER): Payer: Self-pay

## 2017-12-05 DIAGNOSIS — F17210 Nicotine dependence, cigarettes, uncomplicated: Secondary | ICD-10-CM | POA: Insufficient documentation

## 2017-12-05 DIAGNOSIS — R1084 Generalized abdominal pain: Secondary | ICD-10-CM | POA: Insufficient documentation

## 2017-12-05 DIAGNOSIS — R109 Unspecified abdominal pain: Secondary | ICD-10-CM

## 2017-12-05 LAB — RAPID URINE DRUG SCREEN, HOSP PERFORMED
AMPHETAMINES: NOT DETECTED
Benzodiazepines: NOT DETECTED
COCAINE: NOT DETECTED
OPIATES: NOT DETECTED
TETRAHYDROCANNABINOL: NOT DETECTED

## 2017-12-05 LAB — URINALYSIS, ROUTINE W REFLEX MICROSCOPIC
BILIRUBIN URINE: NEGATIVE
Glucose, UA: NEGATIVE mg/dL
Hgb urine dipstick: NEGATIVE
KETONES UR: NEGATIVE mg/dL
Leukocytes, UA: NEGATIVE
Nitrite: NEGATIVE
PH: 5.5 (ref 5.0–8.0)
Protein, ur: NEGATIVE mg/dL
Specific Gravity, Urine: <1.005 — ABNORMAL LOW (ref 1.005–1.030)

## 2017-12-05 MED ORDER — KETAMINE HCL 10 MG/ML IJ SOLN
0.2000 mg/kg | Freq: Once | INTRAMUSCULAR | Status: DC
  Filled 2017-12-05: qty 1

## 2017-12-05 MED ORDER — SODIUM CHLORIDE 0.9 % IV BOLUS
500.0000 mL | Freq: Once | INTRAVENOUS | Status: AC
  Administered 2017-12-05: 500 mL via INTRAVENOUS

## 2017-12-05 MED ORDER — FENTANYL CITRATE (PF) 100 MCG/2ML IJ SOLN
INTRAMUSCULAR | Status: AC
  Filled 2017-12-05: qty 2

## 2017-12-05 MED ORDER — TAMSULOSIN HCL 0.4 MG PO CAPS
0.4000 mg | ORAL_CAPSULE | Freq: Every day | ORAL | Status: DC
  Administered 2017-12-05: 0.4 mg via ORAL
  Filled 2017-12-05: qty 1

## 2017-12-05 MED ORDER — ONDANSETRON 8 MG PO TBDP: ORAL_TABLET | ORAL | 0 refills | Status: DC

## 2017-12-05 MED ORDER — TAMSULOSIN HCL 0.4 MG PO CAPS: 0.4000 mg | ORAL_CAPSULE | Freq: Every day | ORAL | 0 refills | Status: DC

## 2017-12-05 MED ORDER — FENTANYL CITRATE (PF) 100 MCG/2ML IJ SOLN
50.0000 ug | Freq: Once | INTRAMUSCULAR | Status: AC
  Administered 2017-12-05: 50 ug via INTRAVENOUS

## 2017-12-05 NOTE — ED Triage Notes (Signed)
Pt  C/o 10/10 left side flank pain for the past 2 days getting worse today with some blood on the urine. Hx of kidney stones.

## 2017-12-05 NOTE — ED Provider Notes (Signed)
[facility] EMERGENCY DEPARTMENT Provider Note   CSN: [id number] Arrival date & time: 12/05/17  0408     History   Chief Complaint Chief Complaint  Patient presents with  . Flank Pain    HPI [name] is a 42 y.o. male.  The history is provided by the patient. No language interpreter was used.  Flank Pain  This is a recurrent problem. The current episode started more than 1 week ago. The problem occurs constantly. The problem has not changed since onset.Pertinent negatives include no chest pain, no abdominal pain, no headaches and no shortness of breath. Nothing aggravates the symptoms. Nothing relieves the symptoms. Treatments tried: Oxycodone 15 and oxy 80 mg and ativan  The treatment provided no relief.    Past Medical History:  Diagnosis Date  . Chronic abdominal pain   . Kidney stones   . Pancreatitis, chronic ([place]) 05/28/2005    There are no active problems to display for this patient.   Past Surgical History:  Procedure Laterality Date  . APPENDECTOMY    . CHOLECYSTECTOMY    . ERCP W/ METAL STENT PLACEMENT    . KIDNEY STONE SURGERY          Home Medications    Prior to Admission medications   Medication Sig Start Date End Date Taking? Authorizing Provider  dicyclomine (BENTYL) 20 MG tablet Take 1 tablet (20 mg total) by mouth 4 (four) times daily -  before meals and at bedtime. 11/05/17   Rolland Porter, MD  LORazepam (ATIVAN PO) Take by mouth.    [provider]  ondansetron (ZOFRAN) 4 MG tablet Take 1 tablet (4 mg total) by mouth every 8 (eight) hours as needed. 11/05/17   Rolland Porter, MD  promethazine (PHENERGAN) 25 MG suppository Place 1 suppository (25 mg total) rectally every 6 (six) hours as needed for nausea or vomiting. 09/10/17   Charlesetta Shanks, MD  topiramate (TOPAMAX) 25 MG tablet Take 25 mg by mouth 2 (two) times daily.    [provider]    Family History Family History  Problem Relation Age of Onset  . Cancer  Mother   . Heart failure Father     Social History Social History   Tobacco Use  . Smoking status: Current Every Day Smoker    Packs/day: 0.50    Types: Cigarettes  . Smokeless tobacco: Never Used  Substance Use Topics  . Alcohol use: Yes    Comment: rarely, pt stated that he drinks once ever 3 months and it is usually a beer at dinner  . Drug use: No     Allergies   Cortisone; Eggs or egg-derived products; Iodinated diagnostic agents; Ketorolac tromethamine; and Haldol [haloperidol lactate]   Review of Systems Review of Systems  Constitutional: Negative for fever.  Respiratory: Negative for shortness of breath.   Cardiovascular: Negative for chest pain.  Gastrointestinal: Negative for abdominal pain, nausea and vomiting.  Genitourinary: Positive for flank pain. Negative for discharge, genital sores and hematuria.  Neurological: Negative for headaches.  All other systems reviewed and are negative.    Physical Exam Updated Vital Signs BP (!) 154/130 (BP Location: Right Arm)   Pulse (!) 128   Temp 98.4 F (36.9 C) (Oral)   Resp 16   Ht 6\' 1"  (1.854 m)   Wt 88.5 kg (195 lb)   SpO2 100%   BMI 25.73 kg/m   Physical Exam  Constitutional: He is oriented to person, place, and time. He appears  well-developed and well-nourished. No distress.  HENT:  Head: Normocephalic and atraumatic.  Mouth/Throat: No oropharyngeal exudate.  Eyes: Pupils are equal, round, and reactive to light. Conjunctivae are normal.  Neck: Normal range of motion. Neck supple.  Cardiovascular: Normal rate, regular rhythm, normal heart sounds and intact distal pulses.  Pulmonary/Chest: Effort normal and breath sounds normal. No stridor. He has no wheezes. He has no rales.  Abdominal: Soft. Bowel sounds are normal. He exhibits no mass. There is no tenderness. There is no rebound and no guarding.  Musculoskeletal: Normal range of motion.  Neurological: He is alert and oriented to person, place, and  time. He displays normal reflexes.  Skin: Skin is warm and dry. Capillary refill takes less than 2 seconds.     ED Treatments / Results  Labs (all labs ordered are listed, but only abnormal results are displayed) Results for orders placed or performed during the hospital encounter of 12/05/17  Rapid urine drug screen (hospital performed)  Result Value Ref Range   Opiates NONE DETECTED NONE DETECTED   Cocaine NONE DETECTED NONE DETECTED   Benzodiazepines NONE DETECTED NONE DETECTED   Amphetamines NONE DETECTED NONE DETECTED   Tetrahydrocannabinol NONE DETECTED NONE DETECTED   Barbiturates (A) NONE DETECTED    Result not available. Reagent lot number recalled by manufacturer.  Urinalysis, Routine w reflex microscopic  Result Value Ref Range   Color, Urine YELLOW YELLOW   APPearance CLEAR CLEAR   Specific Gravity, Urine <1.005 (L) 1.005 - 1.030   pH 5.5 5.0 - 8.0   Glucose, UA NEGATIVE NEGATIVE mg/dL   Hgb urine dipstick NEGATIVE NEGATIVE   Bilirubin Urine NEGATIVE NEGATIVE   Ketones, ur NEGATIVE NEGATIVE mg/dL   Protein, ur NEGATIVE NEGATIVE mg/dL   Nitrite NEGATIVE NEGATIVE   Leukocytes, UA NEGATIVE NEGATIVE   Ct Renal Stone Study  Result Date: 12/05/2017 CLINICAL DATA:  Severe left flank pain for 2 days. History of renal stones. EXAM: CT ABDOMEN AND PELVIS WITHOUT CONTRAST TECHNIQUE: Multidetector CT imaging of the abdomen and pelvis was performed following the standard protocol without IV contrast. COMPARISON:  Most recent CT 09/10/2017 FINDINGS: Lower chest: Included lung bases are clear. Hepatobiliary: Upper liver not included in the field of view. Mild decreased hepatic density consistent with steatosis. Clips in the gallbladder fossa postcholecystectomy. No biliary dilatation. Pancreas: No ductal dilatation or inflammation. Spleen: Normal in size without focal abnormality. Adrenals/Urinary Tract: No adrenal nodule. Mild left hydroureteronephrosis with ureter dilated throughout  its course. No ureteral stone. There is a 4 mm stone dependently in the urinary bladder. No additional urolithiasis. No right hydronephrosis or perinephric edema. No bladder wall thickening. Stomach/Bowel: Portions of the stomach not included in the field of view. No bowel wall thickening, inflammatory change or obstruction. Fluid within the duodenum may be due to p.o. ingestion, no evidence of bowel wall thickening or inflammation. Surgical clips at the base of the cecum from appendectomy. Vascular/Lymphatic: Minimal aortic atherosclerosis. Normal caliber abdominal vasculature. No enlarged abdominal or pelvic lymph nodes. Reproductive: Prostatic calcifications. Other: Pelvic phleboliths. No free air, free fluid, or intra-abdominal fluid collection. Musculoskeletal: There are no acute or suspicious osseous abnormalities. IMPRESSION: 1. Mild left hydroureteronephrosis likely secondary to 4 mm stone in the urinary bladder. No ureteral calculi. 2. Mild hepatic steatosis. Electronically Signed   By: Jeb Levering M.D.   On: 12/05/2017 05:09    EKG None  Radiology Ct Renal Stone Study  Result Date: 12/05/2017 CLINICAL DATA:  Severe left flank pain for  2 days. History of renal stones. EXAM: CT ABDOMEN AND PELVIS WITHOUT CONTRAST TECHNIQUE: Multidetector CT imaging of the abdomen and pelvis was performed following the standard protocol without IV contrast. COMPARISON:  Most recent CT 09/10/2017 FINDINGS: Lower chest: Included lung bases are clear. Hepatobiliary: Upper liver not included in the field of view. Mild decreased hepatic density consistent with steatosis. Clips in the gallbladder fossa postcholecystectomy. No biliary dilatation. Pancreas: No ductal dilatation or inflammation. Spleen: Normal in size without focal abnormality. Adrenals/Urinary Tract: No adrenal nodule. Mild left hydroureteronephrosis with ureter dilated throughout its course. No ureteral stone. There is a 4 mm stone dependently in the  urinary bladder. No additional urolithiasis. No right hydronephrosis or perinephric edema. No bladder wall thickening. Stomach/Bowel: Portions of the stomach not included in the field of view. No bowel wall thickening, inflammatory change or obstruction. Fluid within the duodenum may be due to p.o. ingestion, no evidence of bowel wall thickening or inflammation. Surgical clips at the base of the cecum from appendectomy. Vascular/Lymphatic: Minimal aortic atherosclerosis. Normal caliber abdominal vasculature. No enlarged abdominal or pelvic lymph nodes. Reproductive: Prostatic calcifications. Other: Pelvic phleboliths. No free air, free fluid, or intra-abdominal fluid collection. Musculoskeletal: There are no acute or suspicious osseous abnormalities. IMPRESSION: 1. Mild left hydroureteronephrosis likely secondary to 4 mm stone in the urinary bladder. No ureteral calculi. 2. Mild hepatic steatosis. Electronically Signed   By: Jeb Levering M.D.   On: 12/05/2017 05:09    Procedures Procedures (including critical care time)  Medications Ordered in ED Medications  ketamine (KETALAR) injection 18 mg (18 mg Intravenous Not Given 12/05/17 0555)  sodium chloride 0.9 % bolus 500 mL (500 mLs Intravenous New Bag/Given 12/05/17 0615)  tamsulosin (FLOMAX) capsule 0.4 mg (0.4 mg Oral Given 12/05/17 0613)  fentaNYL (SUBLIMAZE) injection 50 mcg (has no administration in time range)     Final Clinical Impressions(s) / ED Diagnoses   UDS is negative for both narcotics and benzos and he holds multiple valid prescriptions for this according to the [facility].  He is acting like he is withdrawing from these medications. Patient's jitteriness this is likely withdrawal.  The stone in the bladder was likely passed several days ago as the expected blood in the urine has already cleared.  Patient initially had no ride and ketamine was ordered per protocol.  However, patient refused this medication.  Wife has presented and  patient will be given one dose of medication and was told he must be driven home by his wife.  He will not be given an rx for narcotics given multiple outstanding RX in the [facility].  Will give strainer, flomax and zofran as well as a referral to urology.    Return for pain, numbness, changes in vision or speech, fevers >100.4 unrelieved by medication, shortness of breath, intractable vomiting, or diarrhea, abdominal pain, Inability to tolerate liquids or food, cough, altered mental status or any concerns. No signs of systemic illness or infection. The patient is nontoxic-appearing on exam and vital signs are within normal limits. Will refer to urology for microscopy hematuria as patient is asymptomatic.  I have reviewed the triage vital signs and the nursing notes. Pertinent labs &imaging results that were available during my care of the patient were reviewed by me and considered in my medical decision making (see chart for details).  After history, exam, and medical workup I feel the patient has been appropriately medically screened and is safe for discharge home. Pertinent diagnoses were discussed with  the patient. Patient was given return precautions.    Elky Funches, MD 12/05/17 [id number]

## 2017-12-12 ENCOUNTER — Encounter (HOSPITAL_BASED_OUTPATIENT_CLINIC_OR_DEPARTMENT_OTHER): Payer: Self-pay | Admitting: Emergency Medicine

## 2017-12-12 ENCOUNTER — Other Ambulatory Visit: Payer: Self-pay

## 2017-12-12 ENCOUNTER — Emergency Department (HOSPITAL_BASED_OUTPATIENT_CLINIC_OR_DEPARTMENT_OTHER)
Admission: EM | Admit: 2017-12-12 | Discharge: 2017-12-12 | Disposition: A | Discharge status: Home / Self Care | Payer: Self-pay | Attending: Emergency Medicine | Admitting: Emergency Medicine

## 2017-12-12 DIAGNOSIS — Z79899 Other long term (current) drug therapy: Secondary | ICD-10-CM | POA: Insufficient documentation

## 2017-12-12 DIAGNOSIS — F17210 Nicotine dependence, cigarettes, uncomplicated: Secondary | ICD-10-CM | POA: Insufficient documentation

## 2017-12-12 DIAGNOSIS — M62838 Other muscle spasm: Secondary | ICD-10-CM | POA: Insufficient documentation

## 2017-12-12 HISTORY — DX: Malingerer (conscious simulation): Z76.5

## 2017-12-12 MED ORDER — DIPHENHYDRAMINE HCL 50 MG/ML IJ SOLN
25.0000 mg | Freq: Once | INTRAMUSCULAR | Status: AC
  Administered 2017-12-12: 25 mg via INTRAVENOUS
  Filled 2017-12-12: qty 1

## 2017-12-12 MED ORDER — LORAZEPAM 2 MG/ML IJ SOLN
1.0000 mg | Freq: Once | INTRAMUSCULAR | Status: AC
  Administered 2017-12-12: 1 mg via INTRAVENOUS
  Filled 2017-12-12: qty 1

## 2017-12-12 NOTE — ED Provider Notes (Signed)
[place] DEPT M[facility] Provider Note: Georgena Spurling, MD, FACEP  CSN: [id number] MRN: [id number] ARRIVAL: 12/12/17 at [place]: [place]  12/12/17 1:59 AM [name] is a 42 y.o. male who was being given an elemi (Canarium luzonicum) essential oil aromatherapy treatment by his wife about 10:30 PM yesterday evening when he began having muscle spasms of the right side of his face and neck.  He is having moderate discomfort with this.  Discontinuing the aromatherapy treatment did not relieve the symptoms.  He states he has had similar reactions to medications in the past but denies being on any psychotropic medications currently.  Although he is frequently seen for chronic abdominal pain he denies abdominal pain at the present time.  Past Medical History:  Diagnosis Date  . Chronic abdominal pain   . Drug-seeking behavior   . Kidney stones   . Pancreatitis, chronic ([place]) 05/28/2005    Past Surgical History:  Procedure Laterality Date  . APPENDECTOMY    . CHOLECYSTECTOMY    . ERCP W/ METAL STENT PLACEMENT    . KIDNEY STONE SURGERY      Family History  Problem Relation Age of Onset  . Cancer Mother   . Heart failure Father     Social History   Tobacco Use  . Smoking status: Current Every Day Smoker    Packs/day: 0.50    Types: Cigarettes  . Smokeless tobacco: Never Used  Substance Use Topics  . Alcohol use: Yes    Comment: rarely, pt stated that he drinks once ever 3 months and it is usually a beer at dinner  . Drug use: No    Prior to Admission medications   Medication Sig Start Date End Date Taking? Authorizing Provider  dicyclomine (BENTYL) 20 MG tablet Take 1 tablet (20 mg total) by mouth 4 (four) times daily -  before meals and at bedtime. 11/05/17   Rolland Porter, MD  LORazepam (ATIVAN PO) Take by mouth.    [provider]  ondansetron (ZOFRAN ODT) 8 MG disintegrating tablet 8mg  ODT q8 hours prn  nausea 12/05/17   Palumbo, April, MD  ondansetron (ZOFRAN) 4 MG tablet Take 1 tablet (4 mg total) by mouth every 8 (eight) hours as needed. 11/05/17   Rolland Porter, MD  promethazine (PHENERGAN) 25 MG suppository Place 1 suppository (25 mg total) rectally every 6 (six) hours as needed for nausea or vomiting. 09/10/17   Charlesetta Shanks, MD  tamsulosin (FLOMAX) 0.4 MG CAPS capsule Take 1 capsule (0.4 mg total) by mouth daily. 12/05/17   Palumbo, April, MD  topiramate (TOPAMAX) 25 MG tablet Take 25 mg by mouth 2 (two) times daily.    [provider]    Allergies Cortisone; Eggs or egg-derived products; Iodinated diagnostic agents; Ketorolac tromethamine; and Haldol [haloperidol lactate]   REVIEW OF SYSTEMS  Negative except as noted here or in the History of Present Illness.   PHYSICAL EXAMINATION  Initial Vital Signs Blood pressure (!) 158/112, pulse (!) 101, temperature 98.2 F (36.8 C), temperature source Oral, resp. rate 16, height 6\' 1"  (1.854 m), weight 88.5 kg (195 lb), SpO2 100 %.  Examination General: Well-developed, well-nourished male in no acute distress; appearance consistent with age of record HENT: normocephalic; atraumatic; spasms of right facial muscles Eyes: pupils equal, round and reactive to light; extraocular muscles intact Neck: supple; spasms of right sternocleidomastoid muscle Heart: regular rate and rhythm Lungs: clear to auscultation  bilaterally Abdomen: soft; nondistended; nontender; bowel sounds present Extremities: No deformity; full range of motion; pulses normal Neurologic: Awake, alert and oriented; motor function intact in all extremities and symmetric; no facial droop Skin: Warm and dry Psychiatric: Normal mood and affect   RESULTS  Summary of this visit's results, reviewed by myself:   EKG Interpretation  Date/Time:    Ventricular Rate:    PR Interval:    QRS Duration:   QT Interval:    QTC Calculation:   R Axis:     Text Interpretation:         Laboratory Studies: No results found for this or any previous visit (from the past 24 hour(s)). Imaging Studies: No results found.  ED COURSE and MDM  Nursing notes and initial vitals signs, including pulse oximetry, reviewed.  Vitals:   12/12/17 0145  BP: (!) 158/112  Pulse: (!) 101  Resp: 16  Temp: 98.2 F (36.8 C)  TempSrc: Oral  SpO2: 100%  Weight: 88.5 kg (195 lb)  Height: 6\' 1"  (1.854 m)   3:40 AM She will relief with IV Benadryl.  Significant relief with IV Ativan.  Patient ready to go home.  Symptoms resembled a dystonic reaction but as noted above he is not on any neuroleptic medications.  It is unclear if this is a reaction to the aromatherapy.  PROCEDURES    ED DIAGNOSES     ICD-10-CM   1. Muscle spasm M62.838        Shanon Rosser, MD 12/12/17 [phone]

## 2017-12-12 NOTE — ED Triage Notes (Signed)
Pt reports facial muscle drawing up since 2230

## 2017-12-12 NOTE — ED Notes (Signed)
ED Provider at bedside. 

## 2017-12-12 NOTE — ED Notes (Signed)
Pt states the medication has not helped yet, but the facial movements don't seem quite as severe as they were.

## 2017-12-16 ENCOUNTER — Encounter (HOSPITAL_COMMUNITY): Payer: Self-pay | Admitting: Emergency Medicine

## 2017-12-16 ENCOUNTER — Other Ambulatory Visit: Payer: Self-pay

## 2017-12-16 ENCOUNTER — Emergency Department (HOSPITAL_COMMUNITY)
Admission: EM | Admit: 2017-12-16 | Discharge: 2017-12-16 | Disposition: A | Discharge status: Home / Self Care | Payer: Self-pay | Attending: Emergency Medicine | Admitting: Emergency Medicine

## 2017-12-16 DIAGNOSIS — R109 Unspecified abdominal pain: Secondary | ICD-10-CM | POA: Insufficient documentation

## 2017-12-16 DIAGNOSIS — F17210 Nicotine dependence, cigarettes, uncomplicated: Secondary | ICD-10-CM | POA: Insufficient documentation

## 2017-12-16 DIAGNOSIS — Z79899 Other long term (current) drug therapy: Secondary | ICD-10-CM | POA: Insufficient documentation

## 2017-12-16 HISTORY — DX: Calculus of kidney: N20.0

## 2017-12-16 LAB — URINALYSIS, ROUTINE W REFLEX MICROSCOPIC
BILIRUBIN URINE: NEGATIVE
Glucose, UA: NEGATIVE mg/dL
HGB URINE DIPSTICK: NEGATIVE
Ketones, ur: NEGATIVE mg/dL
Leukocytes, UA: NEGATIVE
NITRITE: NEGATIVE
PROTEIN: NEGATIVE mg/dL
SPECIFIC GRAVITY, URINE: 1.016 (ref 1.005–1.030)
pH: 5 (ref 5.0–8.0)

## 2017-12-16 NOTE — ED Notes (Addendum)
This RN asked pt to wait so vital signs could be collected and papers could be printed. Pt refused. Pt ambulatory at discharge in no apparent distress. Pt refused signing.

## 2017-12-16 NOTE — ED Notes (Signed)
ED Provider at bedside. 

## 2017-12-16 NOTE — ED Triage Notes (Addendum)
C/o R flank pain with nausea since 10:30pm.  States he passed a kidney stone yesterday and has fragments.  Taking Flomax.  See previous ED visits.

## 2017-12-16 NOTE — ED Provider Notes (Signed)
[facility] EMERGENCY DEPARTMENT Provider Note   CSN: [id number] Arrival date & time: 12/16/17  0402     History   Chief Complaint Chief Complaint  Patient presents with  . Flank Pain    HPI [name] is a 42 y.o. male.  The history is provided by the patient and the spouse.  Flank Pain  This is a recurrent problem. The current episode started 6 to 12 hours ago. The problem occurs constantly. The problem has been rapidly worsening. Pertinent negatives include no chest pain and no abdominal pain. Exacerbated by: palpation. Nothing relieves the symptoms.   Patient with history of chronic abdominal pain, history of kidney stones presents with flank pain.  He thinks he is passing a kidney stone.  He reports he did pass a small stone in his urine.  But he still has flank pain.  No fevers or vomiting.  No chest pain shortness breath.  No other abdominal pain.  No difficulty urinating.  No recent urologic procedures Reports he ran out of his pain medications No leg weakness.  No incontinence.  He reports this feels similar to prior kidney stones. Past Medical History:  Diagnosis Date  . Chronic abdominal pain   . Drug-seeking behavior   . Kidney stone   . Kidney stones   . Pancreatitis, chronic ([place]) 05/28/2005    There are no active problems to display for this patient.   Past Surgical History:  Procedure Laterality Date  . APPENDECTOMY    . CHOLECYSTECTOMY    . ERCP W/ METAL STENT PLACEMENT    . KIDNEY STONE SURGERY          Home Medications    Prior to Admission medications   Medication Sig Start Date End Date Taking? Authorizing Provider  dicyclomine (BENTYL) 20 MG tablet Take 1 tablet (20 mg total) by mouth 4 (four) times daily -  before meals and at bedtime. 11/05/17   Rolland Porter, MD  LORazepam (ATIVAN PO) Take by mouth.    [provider]  ondansetron (ZOFRAN) 4 MG tablet Take 1 tablet (4 mg total) by mouth every 8 (eight) hours as  needed. 11/05/17   Rolland Porter, MD  promethazine (PHENERGAN) 25 MG suppository Place 1 suppository (25 mg total) rectally every 6 (six) hours as needed for nausea or vomiting. 09/10/17   Charlesetta Shanks, MD  tamsulosin (FLOMAX) 0.4 MG CAPS capsule Take 1 capsule (0.4 mg total) by mouth daily. 12/05/17   Palumbo, April, MD  topiramate (TOPAMAX) 25 MG tablet Take 25 mg by mouth 2 (two) times daily.    [provider]    Family History Family History  Problem Relation Age of Onset  . Cancer Mother   . Heart failure Father     Social History Social History   Tobacco Use  . Smoking status: Current Every Day Smoker    Packs/day: 0.50    Types: Cigarettes  . Smokeless tobacco: Never Used  Substance Use Topics  . Alcohol use: Yes    Comment: rarely, pt stated that he drinks once ever 3 months and it is usually a beer at dinner  . Drug use: No     Allergies   Cortisone; Eggs or egg-derived products; Iodinated diagnostic agents; Ketorolac tromethamine; and Haldol [haloperidol lactate]   Review of Systems Review of Systems  Constitutional: Negative for fever.  Cardiovascular: Negative for chest pain.  Gastrointestinal: Negative for abdominal pain.  Genitourinary: Positive for flank pain. Negative for difficulty  urinating.  Musculoskeletal: Positive for back pain.  Neurological: Negative for weakness.     Physical Exam Updated Vital Signs BP (!) 162/115 (BP Location: Right Arm)   Pulse (!) 113   Temp 98.1 F (36.7 C) (Oral)   Resp 18   SpO2 100%   Physical Exam CONSTITUTIONAL: Well developed/well nourished, mildly anxious HEAD: Normocephalic/atraumatic EYES: EOMI/PERRL, no icterus ENMT: Mucous membranes moist NECK: supple no meningeal signs SPINE/BACK:entire spine nontender CV: S1/S2 noted, no murmurs/rubs/gallops noted LUNGS: Lungs are clear to auscultation bilaterally, no apparent distress ABDOMEN: soft, nontender, no rebound or guarding, bowel sounds noted  throughout abdomen CZ:YSAY cva tenderness NEURO: Pt is awake/alert/appropriate, moves all extremitiesx4.  No facial droop.   EXTREMITIES: pulses normal/equal, full ROM SKIN: warm, color normal PSYCH: no abnormalities of mood noted, alert and oriented to situation   ED Treatments / Results  Labs (all labs ordered are listed, but only abnormal results are displayed) Labs Reviewed  URINALYSIS, ROUTINE W REFLEX MICROSCOPIC    EKG None  Radiology No results found.  Procedures Procedures (including critical care time)  Medications Ordered in ED Medications - No data to display   Initial Impression / Assessment and Plan / ED Course  I have reviewed the triage vital signs and the nursing notes.  Pertinent labs results that were available during my care of the patient were reviewed by me and considered in my medical decision making (see chart for details).     Patient presents for recurrent flank pain that he feels is due to kidney stone. CT renal study on July 11 revealed a bladder stone.  No other nephro or ureterolithiasis. His urinalysis is clear, no hematuria or signs of infection. Suspicion for recurrent stone is very low at this time. Advised patient that no further work-up was necessary at this time. He reports he ran out of pain medications, and cannot see his PCP for up to a week. Narcotic database reviewed, he was given a 30-day supply of oxycodone on June 28. I advised him I would be unable to fill his medications.  He then started requesting " pain shot " He can follow-up with his PCP for further pain control Final Clinical Impressions(s) / ED Diagnoses   Final diagnoses:  Flank pain    ED Discharge Orders    None       Ripley Fraise, MD 12/16/17 [phone]

## 2018-01-14 ENCOUNTER — Ambulatory Visit (INDEPENDENT_AMBULATORY_CARE_PROVIDER_SITE_OTHER): Payer: Self-pay | Admitting: Family Medicine

## 2018-01-14 ENCOUNTER — Encounter: Payer: Self-pay | Admitting: Family Medicine

## 2018-01-14 VITALS — BP 122/84 | HR 80 | Temp 98.6°F | Resp 12 | Ht 73.0 in | Wt 206.0 lb

## 2018-01-14 DIAGNOSIS — I10 Essential (primary) hypertension: Secondary | ICD-10-CM

## 2018-01-14 DIAGNOSIS — G8929 Other chronic pain: Secondary | ICD-10-CM | POA: Insufficient documentation

## 2018-01-14 DIAGNOSIS — R109 Unspecified abdominal pain: Secondary | ICD-10-CM

## 2018-01-14 DIAGNOSIS — K861 Other chronic pancreatitis: Secondary | ICD-10-CM

## 2018-01-14 MED ORDER — OXYCODONE HCL 15 MG PO TABS: 15.0000 mg | ORAL_TABLET | Freq: Every day | ORAL | 0 refills | Status: DC | PRN

## 2018-01-14 NOTE — Patient Instructions (Signed)
A few things to remember from today's visit:   Chronic biliary pancreatitis ([place]) - Plan: Ambulatory referral to Pain Clinic, Ambulatory referral to Gastroenterology, oxyCODONE (ROXICODONE) 15 MG immediate release tablet  Hypertension, essential, benign - Plan: metoprolol succinate (TOPROL-XL) 50 MG 24 hr tablet  Apots will be arranged, so I think it is appropriate to follow annually.    Please be sure medication list is accurate. If a new problem present, please set up appointment sooner than planned today.

## 2018-01-14 NOTE — Progress Notes (Signed)
HPI:   [name] is a 42 y.o. male, who is here today with his wife to establish care.  Former PCP: 08/2016. Last preventive routine visit: 1 to 2 years ago.  Chronic medical problems: Chronic abdominal pain (chronic pancreatitis) and nephrolithiasis  with multiple ER visits, last one on 12/16/2017.  Choledocholithiasis and a biliary stricture requiring an ERCP with sphincterotomy and stone removal. Subsequently, patient had post-ERCP pancreatitis followed by a cholecystectomy. He continues having episodes of severe epigastric pain, currently he is on Oxycodone 15 mg prn.    CT renal stone study on 12/05/2017: 1. Mild left hydroureteronephrosis likely secondary to 4 mm stone in the urinary bladder. No ureteral calculi. 2. Mild hepatic steatosis.  CT abdomen pelvics without contrast:  1. No acute abnormality identified in the abdomen or pelvis. 2. Unchanged nonobstructing left renal stone.  CT abdomen pelvics WO contrast: No acute process within the abdomen or pelvis. Nonobstructing stone within the interpolar region of the left kidney. No hydronephrosis.  Stable intrahepatic biliary ductal dilatation status post cholecystectomy.  Lab Results  Component Value Date   WBC 13.7 (H) 11/10/2017   HGB 15.0 11/10/2017   HCT 41.5 11/10/2017   MCV 84.0 11/10/2017   PLT 231 11/10/2017    He has followed with GI, 08/2016.   Concerns today: Oxycodone refilled. History of chronic nonalcoholic pancreatitis, he has undergone extensive work-up and cause has not been identified. He would like a referral to GI at [facility]. Currently he is asymptomatic. Symptoms start suddenly, severe epigastric pain,nausea, and vomiting.He has not identified exacerbating factors. If he takes oxycodone 15 mg when pain is just starting pain usually does not get worse.  He also takes Phenergan 25 mg for nausea/vomiting associated with pancreatitis exacerbation.  Hyperglycemia:  111-127. According to pt,he has been screened for DM at work and negative.  HTN: Dx about 2 years ago. Last eye exam 1-2 years ago. Home BP's 120's/80's.  Currently on Metoprolol Succinate 50 mg,he takes 1/2 tab bid. Denies severe/frequent headache, visual changes, chest pain, dyspnea, palpitation, claudication, focal weakness, or edema.   Review of Systems  Constitutional: Negative for activity change, appetite change, fatigue and fever.  HENT: Negative for mouth sores, nosebleeds, sore throat and trouble swallowing.   Eyes: Negative for redness and visual disturbance.  Respiratory: Negative for cough, shortness of breath and wheezing.   Cardiovascular: Negative for chest pain, palpitations and leg swelling.  Gastrointestinal: Negative for abdominal pain, nausea and vomiting.  Endocrine: Negative for polydipsia, polyphagia and polyuria.  Genitourinary: Negative for decreased urine volume, dysuria and hematuria.  Neurological: Negative for syncope, weakness and headaches.  Psychiatric/Behavioral: Negative for confusion. The patient is nervous/anxious.       Current Outpatient Medications on File Prior to Visit  Medication Sig Dispense Refill  . dicyclomine (BENTYL) 20 MG tablet Take by mouth.    . metoprolol succinate (TOPROL-XL) 50 MG 24 hr tablet Take 1 tablet (50 mg total) by mouth 2 (two) times daily. 30 tablet 0  . ondansetron (ZOFRAN) 4 MG tablet Take 1 tablet (4 mg total) by mouth every 8 (eight) hours as needed. 6 tablet 0  . promethazine (PHENERGAN) 25 MG tablet Take 25 mg by mouth every 6 (six) hours as needed.  11  . tamsulosin (FLOMAX) 0.4 MG CAPS capsule Take 1 capsule (0.4 mg total) by mouth daily. 30 capsule 0   No current facility-administered medications on file prior to visit.  Past Medical History:  Diagnosis Date  . Chronic abdominal pain   . Diverticulitis   . Drug-seeking behavior   . Hypertension   . Kidney stone   . Kidney stones   .  Pancreatitis, chronic ([place]) 05/28/2005   Allergies  Allergen Reactions  . Cortisone Other (See Comments)    drops potassium level "bottoms out" potassium level drops potassium level Bottoms out potassium   . Eggs Or Egg-Derived Products Hives    Rash   . Ketorolac Tromethamine Hives and Shortness Of Breath  . Haloperidol Other (See Comments)    "jittery" "jittery"   . Hydrocortisone Hives    Also drops potassium level  . Iodinated Diagnostic Agents Nausea And Vomiting and Other (See Comments)    Hives Hives Hives  . Ketorolac Tromethamine Hives  . Haldol [Haloperidol Lactate] Other (See Comments)    "jittery"   . Ketorolac Hives    Hives, slightly labored breathing     Family History  Problem Relation Age of Onset  . Cancer Mother   . Early death Mother   . Hypertension Mother   . Heart failure Father   . Hypertension Father   . Heart disease Father   . Hyperlipidemia Father     Social History   Socioeconomic History  . Marital status: Married    Spouse name: Not on file  . Number of children: Not on file  . Years of education: Not on file  . Highest education level: Not on file  Occupational History  . Not on file  Social Needs  . Financial resource strain: Not on file  . Food insecurity:    Worry: Not on file    Inability: Not on file  . Transportation needs:    Medical: Not on file    Non-medical: Not on file  Tobacco Use  . Smoking status: Current Every Day Smoker    Packs/day: 0.50    Types: Cigarettes  . Smokeless tobacco: Never Used  Substance and Sexual Activity  . Alcohol use: Yes    Comment: rarely, pt stated that he drinks once ever 3 months and it is usually a beer at dinner  . Drug use: No  . Sexual activity: Yes    Partners: Female  Lifestyle  . Physical activity:    Days per week: Not on file    Minutes per session: Not on file  . Stress: Not on file  Relationships  . Social connections:    Talks on phone: Not on file     Gets together: Not on file    Attends religious service: Not on file    Active member of club or organization: Not on file    Attends meetings of clubs or organizations: Not on file    Relationship status: Not on file  Other Topics Concern  . Not on file  Social History Narrative  . Not on file    Vitals:   01/14/18 0849  BP: 122/84  Pulse: 80  Resp: 12  Temp: 98.6 F (37 C)  SpO2: 98%    Body mass index is 27.18 kg/m.   Physical Exam  Nursing note reviewed. Constitutional: He is oriented to person, place, and time. He appears well-developed and well-nourished. No distress.  HENT:  Head: Normocephalic and atraumatic.  Mouth/Throat: Oropharynx is clear and moist and mucous membranes are normal. He has dentures.  Eyes: Pupils are equal, round, and reactive to light. Conjunctivae are normal.  Cardiovascular: Normal rate and regular  rhythm.  No murmur heard. Pulses:      Dorsalis pedis pulses are 2+ on the right side, and 2+ on the left side.  Respiratory: Effort normal and breath sounds normal. No respiratory distress.  GI: Soft. He exhibits no mass. There is no hepatomegaly. There is tenderness. There is no rebound and no guarding.  Musculoskeletal: He exhibits no edema.  Lymphadenopathy:    He has no cervical adenopathy.  Neurological: He is alert and oriented to person, place, and time. He has normal strength. No cranial nerve deficit. Gait normal.  Skin: Skin is warm. No rash noted. No erythema.  Psychiatric: His mood appears anxious.  Well groomed, good eye contact.    ASSESSMENT AND PLAN:   [name] was seen today for establish care.  Diagnoses and all orders for this visit:  Chronic biliary pancreatitis [facility])  He is not having acute symptoms at this time. Referral to GI placed as requested.  -     Ambulatory referral to Pain Clinic -     Ambulatory referral to Gastroenterology -     oxyCODONE (ROXICODONE) 15 MG immediate release tablet; Take 1 tablet  (15 mg total) by mouth daily as needed for pain.  Hypertension, essential, benign  Adequately controlled. No changes in current management. Low salt diet to continue. Eye exam recommended annually. F/U in 12 months, before if needed.  Chronic abdominal pain  Rx for Oxycodone 15 mg # 30 given today, he understands this is one time Rx. Side effects discussed.       He will continue following with pain clinic for pain management.   -     Ambulatory referral to Pain Clinic    Aika Brzoska G. Martinique, MD  [facility]. [place] office.

## 2018-01-18 DIAGNOSIS — I10 Essential (primary) hypertension: Secondary | ICD-10-CM | POA: Insufficient documentation

## 2018-01-18 DIAGNOSIS — K861 Other chronic pancreatitis: Secondary | ICD-10-CM | POA: Insufficient documentation

## 2018-02-25 ENCOUNTER — Encounter: Payer: Self-pay | Admitting: Family Medicine

## 2018-02-25 ENCOUNTER — Ambulatory Visit: Payer: BLUE CROSS/BLUE SHIELD | Admitting: Family Medicine

## 2018-02-25 ENCOUNTER — Encounter: Payer: Self-pay | Admitting: *Deleted

## 2018-02-25 VITALS — BP 140/84 | HR 85 | Temp 98.8°F | Resp 12 | Ht 73.0 in | Wt 206.4 lb

## 2018-02-25 DIAGNOSIS — K861 Other chronic pancreatitis: Secondary | ICD-10-CM

## 2018-02-25 DIAGNOSIS — R112 Nausea with vomiting, unspecified: Secondary | ICD-10-CM

## 2018-02-25 DIAGNOSIS — R109 Unspecified abdominal pain: Secondary | ICD-10-CM

## 2018-02-25 DIAGNOSIS — G8929 Other chronic pain: Secondary | ICD-10-CM

## 2018-02-25 LAB — CBC WITH DIFFERENTIAL/PLATELET
BASOS PCT: 0.7 % (ref 0.0–3.0)
Basophils Absolute: 0.1 10*3/uL (ref 0.0–0.1)
EOS PCT: 2 % (ref 0.0–5.0)
Eosinophils Absolute: 0.3 10*3/uL (ref 0.0–0.7)
HEMATOCRIT: 47.4 % (ref 39.0–52.0)
Hemoglobin: 16.6 g/dL (ref 13.0–17.0)
LYMPHS ABS: 3.3 10*3/uL (ref 0.7–4.0)
LYMPHS PCT: 26.1 % (ref 12.0–46.0)
MCHC: 35.1 g/dL (ref 30.0–36.0)
MCV: 87.3 fl (ref 78.0–100.0)
MONOS PCT: 8.3 % (ref 3.0–12.0)
Monocytes Absolute: 1 10*3/uL (ref 0.1–1.0)
NEUTROS ABS: 7.9 10*3/uL — AB (ref 1.4–7.7)
Neutrophils Relative %: 62.9 % (ref 43.0–77.0)
PLATELETS: 223 10*3/uL (ref 150.0–400.0)
RBC: 5.42 Mil/uL (ref 4.22–5.81)
RDW: 13.8 % (ref 11.5–15.5)
WBC: 12.5 10*3/uL — ABNORMAL HIGH (ref 4.0–10.5)

## 2018-02-25 LAB — COMPREHENSIVE METABOLIC PANEL
ALK PHOS: 60 U/L (ref 39–117)
ALT: 23 U/L (ref 0–53)
AST: 17 U/L (ref 0–37)
Albumin: 4.5 g/dL (ref 3.5–5.2)
BUN: 11 mg/dL (ref 6–23)
CALCIUM: 9.6 mg/dL (ref 8.4–10.5)
CO2: 24 meq/L (ref 19–32)
Chloride: 107 mEq/L (ref 96–112)
Creatinine, Ser: 0.9 mg/dL (ref 0.40–1.50)
GFR: 99.09 mL/min (ref >60.00)
Glucose, Bld: 86 mg/dL (ref 70–99)
POTASSIUM: 3.6 meq/L (ref 3.5–5.1)
Sodium: 139 mEq/L (ref 135–145)
Total Bilirubin: 0.6 mg/dL (ref 0.2–1.2)
Total Protein: 7.1 g/dL (ref 6.0–8.3)

## 2018-02-25 LAB — LIPASE: Lipase: 28 U/L (ref 11.0–59.0)

## 2018-02-25 MED ORDER — OXYCODONE HCL 15 MG PO TABS: 15.0000 mg | ORAL_TABLET | Freq: Two times a day (BID) | ORAL | 0 refills | Status: DC | PRN

## 2018-02-25 NOTE — Patient Instructions (Addendum)
A few things to remember from today's visit:   Nausea and vomiting in adult  Chronic biliary pancreatitis ([place]) - Plan: oxyCODONE (ROXICODONE) 15 MG immediate release tablet, CBC with Differential/Platelet, Comprehensive metabolic panel, Lipase  Today I will start managing your pain, so continue oxycodone 50 mg twice daily as needed, total of 6 tablets/month. We are awaiting for appointment with the GI at [facility], so for now we will hold on medication contract. Continue Phenergan and Zofran for nausea and vomiting. I will see you back in 2 months.   Please be sure medication list is accurate. If a new problem present, please set up appointment sooner than planned today.

## 2018-02-25 NOTE — Progress Notes (Signed)
HPI:  Chief Complaint  Patient presents with  . ER Follow-up    [name] is a 42 y.o. male, who is here today with his wife to follow on a couple of recent ER visits. History of chronic epigastric pain,Hx or chronic biliary pancreatitis. Since his last visit he has been in the ER 2 times.  Last time he was evaluated in the ER on 02/23/18.  I do not have records of ER visits. According to pt labs were done,otherwise normal except for slightly elevated lipase. Imaging was done and negative for acute findings.   He did not received Rx upon ER discharge.  He presented to the ER with severe epigastric pain,no radiated. Nausea and vomiting. No fever,chills,or urinary symptoms.  Diarrhea x 2. Last episode of vomiting was last night.'Deneis hematemesis,melena,or blood in stool.  He has been on Oxycodone 15 mg as needed for epigastric pain. Usually takes Oxycodone as soon as pain starts and prevents him for getting severe pain and end up in the ER.  He denies side effects.  Pain is constant 3/10 and during periods of exacerbation 8/10 or worse. He has not identified exacerbating or alleviating factors.   Review of Systems  Constitutional: Positive for fatigue. Negative for activity change, appetite change, fever and unexpected weight change.  HENT: Negative for nosebleeds, sore throat and trouble swallowing.   Eyes: Negative for redness and visual disturbance.  Respiratory: Negative for cough, shortness of breath and wheezing.   Cardiovascular: Negative for chest pain, palpitations and leg swelling.  Gastrointestinal: Positive for abdominal pain, diarrhea, nausea and vomiting.  Genitourinary: Negative for decreased urine volume and hematuria.  Neurological: Negative for weakness and headaches.  Psychiatric/Behavioral: Positive for sleep disturbance. The patient is nervous/anxious.       Current Outpatient Medications on File Prior to Visit    Medication Sig Dispense Refill  . dicyclomine (BENTYL) 20 MG tablet Take by mouth.    . metoprolol succinate (TOPROL-XL) 50 MG 24 hr tablet Take 1 tablet (50 mg total) by mouth 2 (two) times daily. 30 tablet 0  . ondansetron (ZOFRAN) 4 MG tablet Take 1 tablet (4 mg total) by mouth every 8 (eight) hours as needed. 6 tablet 0  . promethazine (PHENERGAN) 25 MG tablet Take 25 mg by mouth every 6 (six) hours as needed.  11   No current facility-administered medications on file prior to visit.      Past Medical History:  Diagnosis Date  . Chronic abdominal pain   . Diverticulitis   . Drug-seeking behavior   . Hypertension   . Kidney stone   . Kidney stones   . Pancreatitis, chronic ([place]) 05/28/2005   Allergies  Allergen Reactions  . Cortisone Other (See Comments)    drops potassium level "bottoms out" potassium level drops potassium level Bottoms out potassium  "bottoms out" potassium level  . Eggs Or Egg-Derived Products Hives and Other (See Comments)    Rash  Rash  . Ketorolac Tromethamine Hives and Shortness Of Breath  . Haloperidol Other (See Comments)    "jittery" "jittery"   . Hydrocortisone Hives    Also drops potassium level  . Iodinated Diagnostic Agents Nausea And Vomiting and Other (See Comments)    Hives Hives Hives Hives  . Ketorolac Tromethamine Hives  . Haldol [Haloperidol Lactate] Other (See Comments)    "jittery"   . Ketorolac Hives and Other (See Comments)    Hives, slightly labored breathing  Hives, slightly labored breathing    Social History   Socioeconomic History  . Marital status: Married    Spouse name: Not on file  . Number of children: Not on file  . Years of education: Not on file  . Highest education level: Not on file  Occupational History  . Not on file  Social Needs  . Financial resource strain: Not on file  . Food insecurity:    Worry: Not on file    Inability: Not on file  . Transportation needs:    Medical: Not on  file    Non-medical: Not on file  Tobacco Use  . Smoking status: Current Every Day Smoker    Packs/day: 0.50    Types: Cigarettes  . Smokeless tobacco: Never Used  Substance and Sexual Activity  . Alcohol use: Yes    Comment: rarely, pt stated that he drinks once ever 3 months and it is usually a beer at dinner  . Drug use: No  . Sexual activity: Yes    Partners: Female  Lifestyle  . Physical activity:    Days per week: Not on file    Minutes per session: Not on file  . Stress: Not on file  Relationships  . Social connections:    Talks on phone: Not on file    Gets together: Not on file    Attends religious service: Not on file    Active member of club or organization: Not on file    Attends meetings of clubs or organizations: Not on file    Relationship status: Not on file  Other Topics Concern  . Not on file  Social History Narrative  . Not on file    Vitals:   02/25/18 0839  BP: 140/84  Pulse: 85  Resp: 12  Temp: 98.8 F (37.1 C)  SpO2: 97%   Body mass index is 27.23 kg/m.   Physical Exam  Nursing note and vitals reviewed. Constitutional: He is oriented to person, place, and time. He appears well-developed and well-nourished. No distress.  HENT:  Head: Normocephalic and atraumatic.  Mouth/Throat: Oropharynx is clear and moist. Mucous membranes are dry. He has dentures.  Eyes: Conjunctivae and EOM are normal.  Cardiovascular: Normal rate and regular rhythm.  No murmur heard. Pulses:      Dorsalis pedis pulses are 2+ on the right side, and 2+ on the left side.  Respiratory: Effort normal and breath sounds normal. No respiratory distress.  GI: Soft. He exhibits no mass. There is tenderness in the epigastric area. There is no rebound and no guarding.  Musculoskeletal: He exhibits no edema.  Lymphadenopathy:    He has no cervical adenopathy.  Neurological: He is alert and oriented to person, place, and time. He has normal strength. No cranial nerve deficit.  Gait normal.  Skin: Skin is warm. No rash noted. No erythema.  Psychiatric: He has a normal mood and affect.  Well groomed, good eye contact.      ASSESSMENT AND PLAN:   [name] was seen today for er follow-up.  Diagnoses and all orders for this visit:  Lab Results  Component Value Date   WBC 12.5 (H) 02/25/2018   HGB 16.6 02/25/2018   HCT 47.4 02/25/2018   MCV 87.3 02/25/2018   PLT 223.0 02/25/2018   Lab Results  Component Value Date   CREATININE 0.90 02/25/2018   BUN 11 02/25/2018   NA 139 02/25/2018   K 3.6 02/25/2018   CL 107 02/25/2018  CO2 24 02/25/2018   Lab Results  Component Value Date   LIPASE 28.0 02/25/2018    Lab Results  Component Value Date   ALT 23 02/25/2018   AST 17 02/25/2018   ALKPHOS 60 02/25/2018   BILITOT 0.6 02/25/2018    Nausea and vomiting in adult  Still having some nausea. Continue Phenergan and Zofran intermittently. Adequate hydration.  Chronic biliary pancreatitis ([place])  Pending appt with GI at [facility]. Acute symptoms have improved. Instructed about warning signs.  -     oxyCODONE (ROXICODONE) 15 MG immediate release tablet; Take 1 tablet (15 mg total) by mouth 2 (two) times daily as needed for pain. -     CBC with Differential/Platelet -     Comprehensive metabolic panel -     Lipase  Chronic abdominal pain  We discussed current recommendations in regard to chronic opioid use. Pain clinic did not accept pt.  He has tried Hydrocodone and Tramadol but they did not help. Side effects of Oxycodone discussed. We will signs med contract next visit. F/U in 2 months.  -     oxyCODONE (ROXICODONE) 15 MG immediate release tablet; Take 1 tablet (15 mg total) by mouth 2 (two) times daily as needed for pain.    Return in about 2 months (around 04/27/2018) for pain.     Monette Omara G. Martin[place], MD  [facility]. [place] office.

## 2018-03-01 ENCOUNTER — Encounter: Payer: Self-pay | Admitting: Family Medicine

## 2018-03-03 ENCOUNTER — Encounter: Payer: Self-pay | Admitting: *Deleted

## 2018-03-23 DIAGNOSIS — Z87442 Personal history of urinary calculi: Secondary | ICD-10-CM | POA: Diagnosis not present

## 2018-03-23 DIAGNOSIS — K6389 Other specified diseases of intestine: Secondary | ICD-10-CM | POA: Diagnosis not present

## 2018-03-23 DIAGNOSIS — Z9049 Acquired absence of other specified parts of digestive tract: Secondary | ICD-10-CM | POA: Diagnosis not present

## 2018-03-23 DIAGNOSIS — Z886 Allergy status to analgesic agent status: Secondary | ICD-10-CM | POA: Diagnosis not present

## 2018-03-23 DIAGNOSIS — R1011 Right upper quadrant pain: Secondary | ICD-10-CM | POA: Diagnosis not present

## 2018-03-23 DIAGNOSIS — F17210 Nicotine dependence, cigarettes, uncomplicated: Secondary | ICD-10-CM | POA: Diagnosis not present

## 2018-03-23 DIAGNOSIS — Z888 Allergy status to other drugs, medicaments and biological substances status: Secondary | ICD-10-CM | POA: Diagnosis not present

## 2018-03-23 DIAGNOSIS — Z91012 Allergy to eggs: Secondary | ICD-10-CM | POA: Diagnosis not present

## 2018-03-23 DIAGNOSIS — R1012 Left upper quadrant pain: Secondary | ICD-10-CM | POA: Diagnosis not present

## 2018-03-23 DIAGNOSIS — Z9089 Acquired absence of other organs: Secondary | ICD-10-CM | POA: Diagnosis not present

## 2018-03-23 DIAGNOSIS — Z9889 Other specified postprocedural states: Secondary | ICD-10-CM | POA: Diagnosis not present

## 2018-03-23 DIAGNOSIS — R1013 Epigastric pain: Secondary | ICD-10-CM | POA: Diagnosis not present

## 2018-03-23 DIAGNOSIS — K529 Noninfective gastroenteritis and colitis, unspecified: Secondary | ICD-10-CM | POA: Diagnosis not present

## 2018-03-23 DIAGNOSIS — K861 Other chronic pancreatitis: Secondary | ICD-10-CM | POA: Diagnosis not present

## 2018-03-25 ENCOUNTER — Encounter: Payer: Self-pay | Admitting: Family Medicine

## 2018-03-25 ENCOUNTER — Ambulatory Visit: Payer: BLUE CROSS/BLUE SHIELD | Admitting: Family Medicine

## 2018-03-25 VITALS — BP 150/95 | HR 77 | Temp 98.4°F | Resp 12 | Ht 73.0 in | Wt 213.2 lb

## 2018-03-25 DIAGNOSIS — I10 Essential (primary) hypertension: Secondary | ICD-10-CM

## 2018-03-25 DIAGNOSIS — K5792 Diverticulitis of intestine, part unspecified, without perforation or abscess without bleeding: Secondary | ICD-10-CM | POA: Diagnosis not present

## 2018-03-25 NOTE — Progress Notes (Signed)
HPI:   [name] is a 42 y.o. male, who is here today to follow on recent ER visit. He was seen on 03/23/2017 at [facility] because of left-sided abdominal pain.  He cannot describe type of pain, it is constant, not radiated. Pain is slightly better today, 4/10. He has not identified exacerbating or alleviating factors.   According to patient, he had blood work and abdominal CT done. He was diagnosed with acute diverticulitis and started on Cipro 500 mg twice daily and Flagyl 500 mg 3 times daily. In general he is tolerating medication well, it is aggravated nausea. He has not noted fever, chills, dyspnea, chest pain, changes in bowel habits, blood in the stool, vomiting, or urinary symptoms.  Hypertension: Today BP is elevated. He is on metoprolol succinate 50 mg daily, took medication about 30 minutes ago. + Tobacco use. He is checking BP at home, reporting "normal" readings, 130s/80s.  Negative for unusual headache, visual changes, or edema.  Lab Results  Component Value Date   CREATININE 0.90 02/25/2018   BUN 11 02/25/2018   NA 139 02/25/2018   K 3.6 02/25/2018   CL 107 02/25/2018   CO2 24 02/25/2018     Review of Systems  Constitutional: Positive for appetite change and fatigue. Negative for chills and fever.  HENT: Negative for sore throat and trouble swallowing.   Respiratory: Negative for cough, shortness of breath and wheezing.   Cardiovascular: Negative for palpitations and leg swelling.  Gastrointestinal: Positive for abdominal pain and nausea. Negative for blood in stool and vomiting.       No changes in bowel habits.  Last bowel movement yesterday.  Genitourinary: Negative for decreased urine volume, dysuria and hematuria.  Neurological: Negative for syncope, weakness and headaches.      Current Outpatient Medications on File Prior to Visit  Medication Sig Dispense Refill  . ciprofloxacin (CIPRO) 500 MG tablet     .  dicyclomine (BENTYL) 20 MG tablet Take by mouth.    . metoprolol succinate (TOPROL-XL) 50 MG 24 hr tablet Take 1 tablet (50 mg total) by mouth 2 (two) times daily. 30 tablet 0  . metroNIDAZOLE (FLAGYL) 500 MG tablet     . ondansetron (ZOFRAN) 4 MG tablet Take 1 tablet (4 mg total) by mouth every 8 (eight) hours as needed. 6 tablet 0  . oxyCODONE (ROXICODONE) 15 MG immediate release tablet Take 1 tablet (15 mg total) by mouth 2 (two) times daily as needed for pain. 60 tablet 0  . promethazine (PHENERGAN) 25 MG tablet Take 25 mg by mouth every 6 (six) hours as needed.  11   No current facility-administered medications on file prior to visit.      Past Medical History:  Diagnosis Date  . Chronic abdominal pain   . Diverticulitis   . Drug-seeking behavior   . Hypertension   . Kidney stone   . Kidney stones   . Pancreatitis, chronic ([place]) 05/28/2005   Allergies  Allergen Reactions  . Cortisone Other (See Comments)    drops potassium level "bottoms out" potassium level drops potassium level Bottoms out potassium  "bottoms out" potassium level  . Eggs Or Egg-Derived Products Hives and Other (See Comments)    Rash  Rash  . Ketorolac Tromethamine Hives and Shortness Of Breath  . Haloperidol Other (See Comments)    "jittery" "jittery"   . Hydrocortisone Hives    Also drops potassium level  . Iodinated Diagnostic Agents  Nausea And Vomiting and Other (See Comments)    Hives Hives Hives Hives  . Ketorolac Tromethamine Hives  . Haldol [Haloperidol Lactate] Other (See Comments)    "jittery"   . Ketorolac Hives and Other (See Comments)    Hives, slightly labored breathing  Hives, slightly labored breathing    Social History   Socioeconomic History  . Marital status: Married    Spouse name: Not on file  . Number of children: Not on file  . Years of education: Not on file  . Highest education level: Not on file  Occupational History  . Not on file  Social Needs  .  Financial resource strain: Not on file  . Food insecurity:    Worry: Not on file    Inability: Not on file  . Transportation needs:    Medical: Not on file    Non-medical: Not on file  Tobacco Use  . Smoking status: Current Every Day Smoker    Packs/day: 0.50    Types: Cigarettes  . Smokeless tobacco: Never Used  Substance and Sexual Activity  . Alcohol use: Yes    Comment: rarely, pt stated that he drinks once ever 3 months and it is usually a beer at dinner  . Drug use: No  . Sexual activity: Yes    Partners: Female  Lifestyle  . Physical activity:    Days per week: Not on file    Minutes per session: Not on file  . Stress: Not on file  Relationships  . Social connections:    Talks on phone: Not on file    Gets together: Not on file    Attends religious service: Not on file    Active member of club or organization: Not on file    Attends meetings of clubs or organizations: Not on file    Relationship status: Not on file  Other Topics Concern  . Not on file  Social History Narrative  . Not on file    Vitals:   03/25/18 0808 03/25/18 0836  BP: (!) 150/100 (!) 150/95  Pulse: 77   Resp: 12   Temp: 98.4 F (36.9 C)   SpO2: 98%    Body mass index is 28.13 kg/m.   Physical Exam  Nursing note and vitals reviewed. Constitutional: He is oriented to person, place, and time. He appears well-developed. No distress.  HENT:  Head: Normocephalic and atraumatic.  Mouth/Throat: Oropharynx is clear and moist and mucous membranes are normal.  Eyes: Conjunctivae and EOM are normal.  Cardiovascular: Normal rate and regular rhythm.  No murmur heard. Respiratory: Effort normal and breath sounds normal. No respiratory distress.  GI: Soft. Bowel sounds are normal. He exhibits no mass. There is no hepatomegaly. There is tenderness in the left upper quadrant and left lower quadrant. There is no rigidity, no rebound and no guarding.  Musculoskeletal: He exhibits no edema.    Lymphadenopathy:    He has no cervical adenopathy.  Neurological: He is alert and oriented to person, place, and time. He has normal strength. Gait normal.  Skin: Skin is warm. No rash noted. No erythema.  Psychiatric: He has a normal mood and affect.  Well groomed,goog eye contact.    ASSESSMENT AND PLAN:  [name] was seen today for diverticulitis.  Diagnoses and all orders for this visit:  Acute diverticulitis of intestine Improving. We discussed some side effects of antibiotic treatment. He will complete 10 days of Cipro and Flagyl. Instructed about warning signs.  At the time of the visit I do not have a copy of lab work, CT scan report, or copy of ER visit.  Instructed to sign a release form, so we can obtain labs and imaging report.   Hypertension, essential, benign BP still elevated when rechecked, at 150/95. For now no changes in metoprolol succinate 50 mg. He will continue monitoring BP and recommend bringing BP monitor next visit to be sure it is accurate. Instructed about warning signs.       Wessie Shanks G. Martinique, MD  [facility]. [place] office.

## 2018-03-25 NOTE — Assessment & Plan Note (Signed)
BP still elevated when rechecked, at 150/95. For now no changes in metoprolol succinate 50 mg. He will continue monitoring BP and recommend bringing BP monitor next visit to be sure it is accurate. Instructed about warning signs.

## 2018-03-25 NOTE — Patient Instructions (Addendum)
A few things to remember from today's visit:   Acute diverticulitis of intestine  Hypertension, essential, benign Complete antibiotic treatment. Monitor for fever and chills or worsening pain. Once I get a copy of ER visit, need to bring you back to follow on labs done in the ER.  Blood pressure is still elevated, 150/95. For now no changes in your medications but continue monitoring blood pressure at home and bring blood pressure monitor .  Please be sure medication list is accurate. If a new problem present, please set up appointment sooner than planned today.

## 2018-03-31 ENCOUNTER — Encounter: Payer: Self-pay | Admitting: Family Medicine

## 2018-03-31 ENCOUNTER — Ambulatory Visit: Payer: BLUE CROSS/BLUE SHIELD | Admitting: Family Medicine

## 2018-03-31 ENCOUNTER — Encounter: Payer: Self-pay | Admitting: *Deleted

## 2018-03-31 VITALS — BP 124/83 | HR 81 | Temp 98.3°F | Resp 12 | Ht 73.0 in | Wt 213.2 lb

## 2018-03-31 DIAGNOSIS — G8929 Other chronic pain: Secondary | ICD-10-CM | POA: Diagnosis not present

## 2018-03-31 DIAGNOSIS — K861 Other chronic pancreatitis: Secondary | ICD-10-CM

## 2018-03-31 DIAGNOSIS — K5730 Diverticulosis of large intestine without perforation or abscess without bleeding: Secondary | ICD-10-CM | POA: Diagnosis not present

## 2018-03-31 DIAGNOSIS — R112 Nausea with vomiting, unspecified: Secondary | ICD-10-CM

## 2018-03-31 DIAGNOSIS — R109 Unspecified abdominal pain: Secondary | ICD-10-CM | POA: Diagnosis not present

## 2018-03-31 LAB — CBC WITH DIFFERENTIAL/PLATELET
BASOS ABS: 0.1 10*3/uL (ref 0.0–0.1)
Basophils Relative: 0.7 % (ref 0.0–3.0)
Eosinophils Absolute: 0.3 10*3/uL (ref 0.0–0.7)
Eosinophils Relative: 2.5 % (ref 0.0–5.0)
HCT: 48 % (ref 39.0–52.0)
Hemoglobin: 16.6 g/dL (ref 13.0–17.0)
LYMPHS ABS: 3.2 10*3/uL (ref 0.7–4.0)
Lymphocytes Relative: 24.9 % (ref 12.0–46.0)
MCHC: 34.6 g/dL (ref 30.0–36.0)
MCV: 89.2 fl (ref 78.0–100.0)
MONOS PCT: 9 % (ref 3.0–12.0)
Monocytes Absolute: 1.2 10*3/uL — ABNORMAL HIGH (ref 0.1–1.0)
NEUTROS ABS: 8.1 10*3/uL — AB (ref 1.4–7.7)
NEUTROS PCT: 62.9 % (ref 43.0–77.0)
PLATELETS: 217 10*3/uL (ref 150.0–400.0)
RBC: 5.38 Mil/uL (ref 4.22–5.81)
RDW: 13.5 % (ref 11.5–15.5)
WBC: 12.9 10*3/uL — ABNORMAL HIGH (ref 4.0–10.5)

## 2018-03-31 MED ORDER — OXYCODONE HCL 15 MG PO TABS: 15.0000 mg | ORAL_TABLET | Freq: Two times a day (BID) | ORAL | 0 refills | Status: DC | PRN

## 2018-03-31 NOTE — Assessment & Plan Note (Addendum)
Stable. Appointment with GI at [facility] is pending. No changes in oxycodone dose. Instructed about warning signs.

## 2018-03-31 NOTE — Patient Instructions (Addendum)
A few things to remember from today's visit:   Diverticula of colon  Left sided abdominal pain - Plan: CT Abdomen Pelvis W Contrast, CBC with Differential/Platelet  Chronic biliary pancreatitis ([place]) - Plan: oxyCODONE (ROXICODONE) 15 MG immediate release tablet  Chronic abdominal pain - Plan: oxyCODONE (ROXICODONE) 15 MG immediate release tablet  Nausea and vomiting, intractability of vomiting not specified, unspecified vomiting type - Plan: CT Abdomen Pelvis W Contrast Follow a bland diet for the next few days, small meals, adequate hydration.   GET HELP RIGHT AWAY IF:   The pain is does not go away within 2 hours.  Sudden severe/worsening pain.  You keep throwing up (vomiting).  The pain changes and is only in the right or left part of the belly.  Not being able to pass gas or poop.  You have bloody or tarry looking poop.   MAKE SURE YOU:   Understand these instructions.  Will watch your condition.  Will get help right away if you are not doing well or get worse.   If symptoms are persistent please arrange a follow up appointment.     Please be sure medication list is accurate. If a new problem present, please set up appointment sooner than planned today.

## 2018-03-31 NOTE — Assessment & Plan Note (Signed)
We discussed side effects of opioid medication as well as current recommendations. Today we are signing a medication contract. Follow-up in 3 months.

## 2018-03-31 NOTE — Progress Notes (Signed)
HPI:   [name] is a 42 y.o. male, who is here today to follow on recent OV.    He was last seen on 03/25/2018 to follow on prior ER visit, when he was diagnosed with acute diverticulitis and treated with oral antibiotics. At this time I have not received a copy of ER visit nor copy of abdominal imaging. Last visit pain was improving.  He is reporting worsening left sided abdominal pain for the past 3-4 days and constant. Sharp pain, 5-6/10, no radiated. Pain is exacerbated by food intake, still hungry but afraid of eating. No alleviated factors identified.   He completed abx treatment. Nausea ,vomiting,and diarrhea.He has had these symptoms associated with chronic pancreatitis, so he is not sure of these are related to left-sided abdominal pain. He has had 2 days of loose stools. No blood in stool or urinary symptoms.  Denies fever or chills.  Chronic abdominal pain: Epigastric and LUQ pain he has related to chronic pancreatitis is stable. He is on Oxycodone 15 mg bid prn. Pain is constant 4-5/10, no radiated. Not identified exacerbating or alleviating factors.  He denies side effects from medication. Intermittent nausea and vomiting, he takes Zofran and Phenergan. Awaiting for appt with GI.   Review of Systems  Constitutional: Positive for appetite change and fatigue. Negative for fever.  HENT: Negative for mouth sores, nosebleeds and trouble swallowing.   Eyes: Negative for redness and visual disturbance.  Respiratory: Negative for cough, shortness of breath and wheezing.   Cardiovascular: Negative for palpitations and leg swelling.  Gastrointestinal: Positive for abdominal pain, diarrhea, nausea and vomiting. Negative for blood in stool.  Genitourinary: Negative for decreased urine volume, dysuria and hematuria.  Musculoskeletal: Negative for back pain and myalgias.  Skin: Negative for pallor and rash.  Neurological: Negative for syncope, weakness  and headaches.  Psychiatric/Behavioral: Negative for confusion. The patient is not nervous/anxious.       Current Outpatient Medications on File Prior to Visit  Medication Sig Dispense Refill  . dicyclomine (BENTYL) 20 MG tablet Take by mouth.    . metoprolol succinate (TOPROL-XL) 50 MG 24 hr tablet Take 1 tablet (50 mg total) by mouth 2 (two) times daily. 30 tablet 0  . ondansetron (ZOFRAN) 4 MG tablet Take 1 tablet (4 mg total) by mouth every 8 (eight) hours as needed. 6 tablet 0  . promethazine (PHENERGAN) 25 MG tablet Take 25 mg by mouth every 6 (six) hours as needed.  11   No current facility-administered medications on file prior to visit.      Past Medical History:  Diagnosis Date  . Chronic abdominal pain   . Diverticulitis   . Drug-seeking behavior   . Hypertension   . Kidney stone   . Kidney stones   . Pancreatitis, chronic ([place]) 05/28/2005   Allergies  Allergen Reactions  . Cortisone Other (See Comments)    drops potassium level "bottoms out" potassium level drops potassium level Bottoms out potassium  "bottoms out" potassium level  . Eggs Or Egg-Derived Products Hives and Other (See Comments)    Rash  Rash  . Ketorolac Tromethamine Hives and Shortness Of Breath  . Haloperidol Other (See Comments)    "jittery" "jittery"   . Hydrocortisone Hives    Also drops potassium level  . Iodinated Diagnostic Agents Nausea And Vomiting and Other (See Comments)    Hives Hives Hives Hives  . Ketorolac Tromethamine Hives  . Haldol [Haloperidol Lactate] Other (See  Comments)    "jittery"   . Ketorolac Hives and Other (See Comments)    Hives, slightly labored breathing  Hives, slightly labored breathing    Social History   Socioeconomic History  . Marital status: Married    Spouse name: Not on file  . Number of children: Not on file  . Years of education: Not on file  . Highest education level: Not on file  Occupational History  . Not on file  Social  Needs  . Financial resource strain: Not on file  . Food insecurity:    Worry: Not on file    Inability: Not on file  . Transportation needs:    Medical: Not on file    Non-medical: Not on file  Tobacco Use  . Smoking status: Current Every Day Smoker    Packs/day: 0.50    Types: Cigarettes  . Smokeless tobacco: Never Used  Substance and Sexual Activity  . Alcohol use: Yes    Comment: rarely, pt stated that he drinks once ever 3 months and it is usually a beer at dinner  . Drug use: No  . Sexual activity: Yes    Partners: Female  Lifestyle  . Physical activity:    Days per week: Not on file    Minutes per session: Not on file  . Stress: Not on file  Relationships  . Social connections:    Talks on phone: Not on file    Gets together: Not on file    Attends religious service: Not on file    Active member of club or organization: Not on file    Attends meetings of clubs or organizations: Not on file    Relationship status: Not on file  Other Topics Concern  . Not on file  Social History Narrative  . Not on file    Vitals:   03/31/18 0840  BP: 124/83  Pulse: 81  Resp: 12  Temp: 98.3 F (36.8 C)  SpO2: 98%   Body mass index is 28.13 kg/m.   Physical Exam  Nursing note and vitals reviewed. Constitutional: He is oriented to person, place, and time. He appears well-developed. No distress.  HENT:  Head: Normocephalic and atraumatic.  Mouth/Throat: Oropharynx is clear and moist and mucous membranes are normal. He has dentures.  Eyes: Conjunctivae and EOM are normal.  Cardiovascular: Normal rate and regular rhythm.  No murmur heard. Respiratory: Effort normal and breath sounds normal. No respiratory distress.  GI: Soft. He exhibits no mass. There is tenderness (LLQ and LUQ >> epigastric.) in the epigastric area, left upper quadrant and left lower quadrant. There is no rigidity, no rebound and no guarding.  Musculoskeletal: He exhibits no edema.  Lymphadenopathy:     He has no cervical adenopathy.  Neurological: He is alert and oriented to person, place, and time. He has normal strength. Gait normal.  Skin: Skin is warm. No rash noted. No erythema.  Psychiatric: He has a normal mood and affect.  Well groomed,good eye contact.    ASSESSMENT AND PLAN:  Mr. Per was seen today for follow-up.   Orders Placed This Encounter  Procedures  . CT Abdomen Pelvis W Contrast  . CBC with Differential/Platelet    Lab Results  Component Value Date   WBC 12.9 (H) 03/31/2018   HGB 16.6 03/31/2018   HCT 48.0 03/31/2018   MCV 89.2 03/31/2018   PLT 217.0 03/31/2018     Left sided abdominal pain Possible causes discussed. Recently Dx and treated  fir diverticulitis. I have not received copy of ER visit or CT report. Because painis getting worse, stat CT was ordered. Instructed about warning signs, he voices understanding.  -     CT Abdomen Pelvis W Contrast; Future -     CBC with Differential/Platelet  Nausea and vomiting, intractability of vomiting not specified, unspecified vomiting type Hx of chronic N/V, he is not sure if these are now related to Left abdominal pain. Adequate hydration. No changes in Zofran or Phenergan. Instructed about warning signs.  -     CT Abdomen Pelvis W Contrast; Future   Chronic biliary pancreatitis ([place]) Stable. Appointment with GI at [facility] is pending. No changes in oxycodone dose. Instructed about warning signs.   Chronic abdominal pain We discussed side effects of opioid medication as well as current recommendations. Today we are signing a medication contract. Follow-up in 3 months.       Dorreen Valiente G. [place], MD  [facility]. [place] office.

## 2018-04-18 ENCOUNTER — Encounter: Payer: Self-pay | Admitting: Family Medicine

## 2018-04-18 ENCOUNTER — Telehealth: Payer: Self-pay | Admitting: *Deleted

## 2018-04-18 ENCOUNTER — Ambulatory Visit: Payer: BLUE CROSS/BLUE SHIELD | Admitting: Family Medicine

## 2018-04-18 VITALS — BP 126/84 | HR 87 | Temp 98.0°F | Resp 12 | Ht 73.0 in | Wt 212.5 lb

## 2018-04-18 DIAGNOSIS — G8929 Other chronic pain: Secondary | ICD-10-CM

## 2018-04-18 DIAGNOSIS — R1032 Left lower quadrant pain: Secondary | ICD-10-CM

## 2018-04-18 DIAGNOSIS — K861 Other chronic pancreatitis: Secondary | ICD-10-CM

## 2018-04-18 DIAGNOSIS — R109 Unspecified abdominal pain: Secondary | ICD-10-CM

## 2018-04-18 LAB — URINALYSIS, ROUTINE W REFLEX MICROSCOPIC
Bilirubin Urine: NEGATIVE
Hgb urine dipstick: NEGATIVE
KETONES UR: NEGATIVE
Leukocytes, UA: NEGATIVE
NITRITE: NEGATIVE
RBC / HPF: NONE SEEN (ref 0–?)
SPECIFIC GRAVITY, URINE: 1.025 (ref 1.000–1.030)
TOTAL PROTEIN, URINE-UPE24: NEGATIVE
URINE GLUCOSE: NEGATIVE
Urobilinogen, UA: 0.2 (ref 0.0–1.0)
pH: 5.5 (ref 5.0–8.0)

## 2018-04-18 LAB — CBC WITH DIFFERENTIAL/PLATELET
Basophils Absolute: 0.1 10*3/uL (ref 0.0–0.1)
Basophils Relative: 1 % (ref 0.0–3.0)
EOS ABS: 0.3 10*3/uL (ref 0.0–0.7)
Eosinophils Relative: 2.7 % (ref 0.0–5.0)
HCT: 47.2 % (ref 39.0–52.0)
HEMOGLOBIN: 16.4 g/dL (ref 13.0–17.0)
LYMPHS ABS: 3.8 10*3/uL (ref 0.7–4.0)
Lymphocytes Relative: 31.8 % (ref 12.0–46.0)
MCHC: 34.6 g/dL (ref 30.0–36.0)
MCV: 88.6 fl (ref 78.0–100.0)
MONO ABS: 1.4 10*3/uL — AB (ref 0.1–1.0)
Monocytes Relative: 11.5 % (ref 3.0–12.0)
NEUTROS PCT: 53 % (ref 43.0–77.0)
Neutro Abs: 6.4 10*3/uL (ref 1.4–7.7)
PLATELETS: 259 10*3/uL (ref 150.0–400.0)
RBC: 5.33 Mil/uL (ref 4.22–5.81)
RDW: 13.6 % (ref 11.5–15.5)
WBC: 12 10*3/uL — AB (ref 4.0–10.5)

## 2018-04-18 LAB — BASIC METABOLIC PANEL
BUN: 10 mg/dL (ref 6–23)
CALCIUM: 9.9 mg/dL (ref 8.4–10.5)
CO2: 27 mEq/L (ref 19–32)
Chloride: 108 mEq/L (ref 96–112)
Creatinine, Ser: 0.91 mg/dL (ref 0.40–1.50)
GFR: 97.77 mL/min (ref >60.00)
Glucose, Bld: 85 mg/dL (ref 70–99)
Potassium: 4 mEq/L (ref 3.5–5.1)
SODIUM: 142 meq/L (ref 135–145)

## 2018-04-18 NOTE — Patient Instructions (Addendum)
A few things to remember from today's visit:   Chronic abdominal pain - Plan: Ambulatory referral to Gastroenterology  Chronic biliary pancreatitis ([place]) - Plan: Ambulatory referral to Gastroenterology  LLQ abdominal pain - Plan: Basic metabolic panel, Urinalysis, Routine w reflex microscopic, CBC with Differential/Platelet It seems like to try to contact you last time you were here but no answer. They will try to call you today please keep your phone on. Be sure that we had the right phone number.   Please be sure medication list is accurate. If a new problem present, please set up appointment sooner than planned today.

## 2018-04-18 NOTE — Progress Notes (Signed)
HPI:  Chief Complaint  Patient presents with  . Follow-up    follow-up on Diverticulitis, sx getting worse    [name] is a 42 y.o. male, who is here today to follow on recent ER visit.  IHe was last seen on 03/31/2017, abdominal CT was ordered.  He states that he did not get a call for appointment information. 03/25/18 ER f/u, he was treated with abx x 2 for acute diverticulitis,symptpoms did not resolve.   LLQ abdominal pain is getting worse, it has been constant for the past week. Sharp,stubbing pain, no radiated, 7/10. + Nausea,which he is not sure if it is related to LLQ pain.   No changes in bowel habits,vomiting,or urinary symptoms.  He has not identified exacerbating or alleviating factors. "Small" subjective fever a few days ago. Pain is getting worse.    He usually had nausea because chronic pancreatitis but it seems to be getting worse. No changes in bowel habits or blood in stool. He has Hx of upper chronic pain due to chronic pancreatitis. According to pt,before he can see specialist in [facility] he needs to see local GI in order to evaluate need for further work up.  He is on Hydrocodone-Acetaminophen 5-325 mg bid as needed.    Review of Systems  Constitutional: Positive for activity change, appetite change and fatigue. Negative for fever.  HENT: Negative for facial swelling, mouth sores, nosebleeds and trouble swallowing.   Eyes: Negative for redness and visual disturbance.  Respiratory: Negative for cough, shortness of breath and wheezing.   Cardiovascular: Negative for palpitations and leg swelling.  Gastrointestinal: Positive for abdominal pain and nausea. Negative for vomiting.  Genitourinary: Negative for decreased urine volume, dysuria and hematuria.  Neurological: Negative for syncope, weakness and headaches.  Psychiatric/Behavioral: Negative for confusion. The patient is nervous/anxious.       Current Outpatient  Medications on File Prior to Visit  Medication Sig Dispense Refill  . dicyclomine (BENTYL) 20 MG tablet Take by mouth.    . metoprolol succinate (TOPROL-XL) 50 MG 24 hr tablet Take 1 tablet (50 mg total) by mouth 2 (two) times daily. 30 tablet 0  . ondansetron (ZOFRAN) 4 MG tablet Take 1 tablet (4 mg total) by mouth every 8 (eight) hours as needed. 6 tablet 0  . oxyCODONE (ROXICODONE) 15 MG immediate release tablet Take 1 tablet (15 mg total) by mouth 2 (two) times daily as needed for pain. 60 tablet 0  . promethazine (PHENERGAN) 25 MG tablet Take 25 mg by mouth every 6 (six) hours as needed.  11   No current facility-administered medications on file prior to visit.      Past Medical History:  Diagnosis Date  . Chronic abdominal pain   . Diverticulitis   . Drug-seeking behavior   . Hypertension   . Kidney stone   . Kidney stones   . Pancreatitis, chronic ([place]) 05/28/2005   Allergies  Allergen Reactions  . Cortisone Other (See Comments)    drops potassium level "bottoms out" potassium level drops potassium level Bottoms out potassium  "bottoms out" potassium level  . Eggs Or Egg-Derived Products Hives and Other (See Comments)    Rash  Rash  . Ketorolac Tromethamine Hives and Shortness Of Breath  . Haloperidol Other (See Comments)    "jittery" "jittery"   . Hydrocortisone Hives    Also drops potassium level  . Iodinated Diagnostic Agents Nausea And Vomiting and Other (See Comments)  Hives Hives Hives Hives  . Ketorolac Tromethamine Hives  . Haldol [Haloperidol Lactate] Other (See Comments)    "jittery"   . Ketorolac Hives and Other (See Comments)    Hives, slightly labored breathing  Hives, slightly labored breathing    Social History   Socioeconomic History  . Marital status: Married    Spouse name: Not on file  . Number of children: Not on file  . Years of education: Not on file  . Highest education level: Not on file  Occupational History  . Not on  file  Social Needs  . Financial resource strain: Not on file  . Food insecurity:    Worry: Not on file    Inability: Not on file  . Transportation needs:    Medical: Not on file    Non-medical: Not on file  Tobacco Use  . Smoking status: Current Every Day Smoker    Packs/day: 0.50    Types: Cigarettes  . Smokeless tobacco: Never Used  Substance and Sexual Activity  . Alcohol use: Yes    Comment: rarely, pt stated that he drinks once ever 3 months and it is usually a beer at dinner  . Drug use: No  . Sexual activity: Yes    Partners: Female  Lifestyle  . Physical activity:    Days per week: Not on file    Minutes per session: Not on file  . Stress: Not on file  Relationships  . Social connections:    Talks on phone: Not on file    Gets together: Not on file    Attends religious service: Not on file    Active member of club or organization: Not on file    Attends meetings of clubs or organizations: Not on file    Relationship status: Not on file  Other Topics Concern  . Not on file  Social History Narrative  . Not on file    Vitals:   04/18/18 1040  BP: 126/84  Pulse: 87  Resp: 12  Temp: 98 F (36.7 C)  SpO2: 98%   Body mass index is 28.04 kg/m.    Physical Exam  Nursing note and vitals reviewed. Constitutional: He is oriented to person, place, and time. He appears well-developed. No distress.  HENT:  Head: Normocephalic and atraumatic.  Mouth/Throat: Oropharynx is clear and moist. Mucous membranes are dry.  Eyes: Conjunctivae and EOM are normal.  Cardiovascular: Normal rate and regular rhythm.  No murmur heard. Respiratory: Effort normal and breath sounds normal. No respiratory distress.  GI: Soft. He exhibits no mass. There is no hepatomegaly. There is tenderness. There is guarding. There is no rigidity and no rebound.    Musculoskeletal: He exhibits no edema.  Lymphadenopathy:    He has no cervical adenopathy.  Neurological: He is alert and  oriented to person, place, and time. He has normal strength. Gait normal.  Skin: Skin is warm. No erythema.  Psychiatric: His mood appears anxious.  Well groomed,good eye contact.    ASSESSMENT AND PLAN:  [name] was seen today for follow-up.  Orders Placed This Encounter  Procedures  . Basic metabolic panel  . Urinalysis, Routine w reflex microscopic  . CBC with Differential/Platelet  . Ambulatory referral to Gastroenterology   Lab Results  Component Value Date   WBC 12.0 (H) 04/18/2018   HGB 16.4 04/18/2018   HCT 47.2 04/18/2018   MCV 88.6 04/18/2018   PLT 259.0 04/18/2018  ' Lab Results  Component Value Date  CREATININE 0.91 04/18/2018   BUN 10 04/18/2018   NA 142 04/18/2018   K 4.0 04/18/2018   CL 108 04/18/2018   CO2 27 04/18/2018    LLQ abdominal pain Subacute. Instructed about warning signs. He missed 3 phone calls for abd CT. Instructed to keep phone on, will ry to re-schedule abd/pelvic CT. Further recommendations will be given according to labs/imaging results.       He is asking what to take for LLQ abdominal pain but I do not feel comfortable increasing dose of opioid med.  -     Basic metabolic panel -     Urinalysis, Routine w reflex microscopic -     CBC with Differential/Platelet  Chronic abdominal pain No changes in Hydrocodone-Acetaminophen. Side effects discussed. Keep next f/u appt.  -     Ambulatory referral to Gastroenterology  Chronic biliary pancreatitis [facility]) GI referral placed as requested.  -     Ambulatory referral to Gastroenterology      Schuyler Olden G. [place], MD  [facility]. [place] office.

## 2018-04-18 NOTE — Telephone Encounter (Signed)
Left detailed message for patient to go to [facility] on Monday as advised. Patient advised to give office a call if he had any questions.  Copied from [place] [phone]. Topic: General - Other >> Apr 18, 2018  3:24 PM [name] wrote: Reason for CRM: Pt has STAT CT ordered and this has been approved by his insurance company. [facility] called in to advise the office the pt never came in as instructed on 11/4 and they wanted to follow up. Mardene Celeste from [facility] called in and wanted [place] to reach out to the pt to advise him to come in on Monday, no appt needed.

## 2018-04-21 ENCOUNTER — Encounter: Payer: Self-pay | Admitting: Family Medicine

## 2018-04-28 ENCOUNTER — Emergency Department (HOSPITAL_BASED_OUTPATIENT_CLINIC_OR_DEPARTMENT_OTHER): Payer: BLUE CROSS/BLUE SHIELD

## 2018-04-28 ENCOUNTER — Emergency Department (HOSPITAL_BASED_OUTPATIENT_CLINIC_OR_DEPARTMENT_OTHER)
Admission: EM | Admit: 2018-04-28 | Discharge: 2018-04-28 | Disposition: A | Discharge status: Home / Self Care | Payer: BLUE CROSS/BLUE SHIELD | Attending: Emergency Medicine | Admitting: Emergency Medicine

## 2018-04-28 ENCOUNTER — Encounter (HOSPITAL_BASED_OUTPATIENT_CLINIC_OR_DEPARTMENT_OTHER): Payer: Self-pay | Admitting: Emergency Medicine

## 2018-04-28 ENCOUNTER — Other Ambulatory Visit: Payer: Self-pay

## 2018-04-28 DIAGNOSIS — Z79899 Other long term (current) drug therapy: Secondary | ICD-10-CM | POA: Diagnosis not present

## 2018-04-28 DIAGNOSIS — F17210 Nicotine dependence, cigarettes, uncomplicated: Secondary | ICD-10-CM | POA: Insufficient documentation

## 2018-04-28 DIAGNOSIS — K760 Fatty (change of) liver, not elsewhere classified: Secondary | ICD-10-CM | POA: Diagnosis not present

## 2018-04-28 DIAGNOSIS — R1032 Left lower quadrant pain: Secondary | ICD-10-CM | POA: Diagnosis not present

## 2018-04-28 DIAGNOSIS — R110 Nausea: Secondary | ICD-10-CM | POA: Diagnosis not present

## 2018-04-28 DIAGNOSIS — Z9049 Acquired absence of other specified parts of digestive tract: Secondary | ICD-10-CM | POA: Diagnosis not present

## 2018-04-28 DIAGNOSIS — I10 Essential (primary) hypertension: Secondary | ICD-10-CM | POA: Diagnosis not present

## 2018-04-28 LAB — COMPREHENSIVE METABOLIC PANEL
ALT: 37 U/L (ref 0–44)
ANION GAP: 8 (ref 5–15)
AST: 27 U/L (ref 15–41)
Albumin: 4.1 g/dL (ref 3.5–5.0)
Alkaline Phosphatase: 50 U/L (ref 38–126)
BUN: 15 mg/dL (ref 6–20)
CHLORIDE: 108 mmol/L (ref 98–111)
CO2: 23 mmol/L (ref 22–32)
Calcium: 9.1 mg/dL (ref 8.9–10.3)
Creatinine, Ser: 0.91 mg/dL (ref 0.61–1.24)
GFR calc non Af Amer: >60 mL/min (ref >60)
Glucose, Bld: 100 mg/dL — ABNORMAL HIGH (ref 70–99)
POTASSIUM: 3.8 mmol/L (ref 3.5–5.1)
SODIUM: 139 mmol/L (ref 135–145)
Total Bilirubin: 0.7 mg/dL (ref 0.3–1.2)
Total Protein: 7 g/dL (ref 6.5–8.1)

## 2018-04-28 LAB — CBC WITH DIFFERENTIAL/PLATELET
ABS IMMATURE GRANULOCYTES: 0.12 10*3/uL — AB (ref 0.00–0.07)
BASOS PCT: 1 %
Basophils Absolute: 0.1 10*3/uL (ref 0.0–0.1)
EOS ABS: 0.2 10*3/uL (ref 0.0–0.5)
EOS PCT: 2 %
HCT: 49 % (ref 39.0–52.0)
Hemoglobin: 16.7 g/dL (ref 13.0–17.0)
Immature Granulocytes: 1 %
Lymphocytes Relative: 23 %
Lymphs Abs: 2.5 10*3/uL (ref 0.7–4.0)
MCH: 29.8 pg (ref 26.0–34.0)
MCHC: 34.1 g/dL (ref 30.0–36.0)
MCV: 87.5 fL (ref 80.0–100.0)
MONO ABS: 1 10*3/uL (ref 0.1–1.0)
Monocytes Relative: 9 %
NEUTROS ABS: 7.1 10*3/uL (ref 1.7–7.7)
Neutrophils Relative %: 64 %
PLATELETS: 205 10*3/uL (ref 150–400)
RBC: 5.6 MIL/uL (ref 4.22–5.81)
RDW: 12.3 % (ref 11.5–15.5)
WBC: 11 10*3/uL — AB (ref 4.0–10.5)
nRBC: 0 % (ref 0.0–0.2)

## 2018-04-28 LAB — URINALYSIS, ROUTINE W REFLEX MICROSCOPIC
BILIRUBIN URINE: NEGATIVE
Glucose, UA: NEGATIVE mg/dL
Hgb urine dipstick: NEGATIVE
Ketones, ur: NEGATIVE mg/dL
Leukocytes, UA: NEGATIVE
NITRITE: NEGATIVE
PROTEIN: NEGATIVE mg/dL
SPECIFIC GRAVITY, URINE: 1.01 (ref 1.005–1.030)
pH: 6.5 (ref 5.0–8.0)

## 2018-04-28 LAB — LIPASE, BLOOD: Lipase: 34 U/L (ref 11–51)

## 2018-04-28 MED ORDER — IOPAMIDOL (ISOVUE-300) INJECTION 61%
100.0000 mL | Freq: Once | INTRAVENOUS | Status: AC | PRN
  Administered 2018-04-28: 100 mL via INTRAVENOUS

## 2018-04-28 MED ORDER — FENTANYL CITRATE (PF) 100 MCG/2ML IJ SOLN
50.0000 ug | Freq: Once | INTRAMUSCULAR | Status: AC
  Administered 2018-04-28: 50 ug via INTRAVENOUS
  Filled 2018-04-28: qty 2

## 2018-04-28 MED ORDER — ONDANSETRON HCL 4 MG/2ML IJ SOLN
4.0000 mg | Freq: Once | INTRAMUSCULAR | Status: AC
  Administered 2018-04-28: 4 mg via INTRAVENOUS
  Filled 2018-04-28: qty 2

## 2018-04-28 MED ORDER — OXYCODONE-ACETAMINOPHEN 5-325 MG PO TABS
2.0000 | ORAL_TABLET | Freq: Once | ORAL | Status: AC
  Administered 2018-04-28: 2 via ORAL
  Filled 2018-04-28: qty 2

## 2018-04-28 NOTE — ED Provider Notes (Signed)
[facility] EMERGENCY DEPARTMENT Provider Note   CSN: [id number] Arrival date & time: 04/28/18  0110     History   Chief Complaint Chief Complaint  Patient presents with  . Abdominal Pain    Left lower    HPI [name] is a 42 y.o. male.  HPI  This is a 43 year old male with a history of chronic abdominal pain, chronic pancreatitis, diverticulitis who presents with left lower quadrant pain.  Patient reports symptoms ongoing for the last month.  He has been seen and evaluated by his primary physician who ordered basic lab work and an outpatient CT.  Patient has been unable to get his outpatient CT scan.  He states over the weekend his pain became worse.  He rates his pain at 6 out of 10.  It is sharp and nonradiating.  It is in the left lower quadrant.  He reports associated nausea.  Denies vomiting, diarrhea, constipation, urinary symptoms.  Denies fevers.  Past Medical History:  Diagnosis Date  . Chronic abdominal pain   . Diverticulitis   . Drug-seeking behavior   . Hypertension   . Kidney stone   . Kidney stones   . Pancreatitis, chronic ([place]) 05/28/2005    Patient Active Problem List   Diagnosis Date Noted  . Nausea and vomiting in adult 02/25/2018  . Hypertension, essential, benign 01/18/2018  . Chronic biliary pancreatitis ([place]) 01/18/2018  . Chronic abdominal pain 01/14/2018    Past Surgical History:  Procedure Laterality Date  . APPENDECTOMY    . CHOLECYSTECTOMY    . ERCP W/ METAL STENT PLACEMENT    . KIDNEY STONE SURGERY          Home Medications    Prior to Admission medications   Medication Sig Start Date End Date Taking? Authorizing Provider  dicyclomine (BENTYL) 20 MG tablet Take by mouth. 01/16/17   [provider]  metoprolol succinate (TOPROL-XL) 50 MG 24 hr tablet Take 1 tablet (50 mg total) by mouth 2 (two) times daily. 01/14/18   Martinique, Betty G, MD  ondansetron (ZOFRAN) 4 MG tablet Take 1 tablet (4 mg total) by  mouth every 8 (eight) hours as needed. 11/05/17   Rolland Porter, MD  oxyCODONE (ROXICODONE) 15 MG immediate release tablet Take 1 tablet (15 mg total) by mouth 2 (two) times daily as needed for pain. 03/31/18   [place], Betty G, MD  promethazine (PHENERGAN) 25 MG tablet Take 25 mg by mouth every 6 (six) hours as needed. 12/29/17   [provider]    Family History Family History  Problem Relation Age of Onset  . Cancer Mother   . Early death Mother   . Hypertension Mother   . Heart failure Father   . Hypertension Father   . Heart disease Father   . Hyperlipidemia Father     Social History Social History   Tobacco Use  . Smoking status: Current Every Day Smoker    Packs/day: 0.50    Types: Cigarettes  . Smokeless tobacco: Never Used  Substance Use Topics  . Alcohol use: Yes    Comment: rarely, pt stated that he drinks once ever 3 months and it is usually a beer at dinner  . Drug use: No     Allergies   Cortisone; Eggs or egg-derived products; Ketorolac tromethamine; Haloperidol; Hydrocortisone; Iodinated diagnostic agents; Ketorolac tromethamine; Haldol [haloperidol lactate]; and Ketorolac   Review of Systems Review of Systems  Constitutional: Negative for fever.  Respiratory: Negative  for shortness of breath.   Cardiovascular: Negative for chest pain.  Gastrointestinal: Positive for abdominal pain and nausea. Negative for blood in stool, constipation, diarrhea and vomiting.  Genitourinary: Negative for dysuria and hematuria.  All other systems reviewed and are negative.    Physical Exam Updated Vital Signs BP (!) 154/107   Pulse 76   Temp 98.6 F (37 C) (Oral)   Resp 18   Ht 1.829 m (6')   Wt 90.7 kg   SpO2 95%   BMI 27.12 kg/m   Physical Exam  Constitutional: He is oriented to person, place, and time. He appears well-developed and well-nourished.  HENT:  Head: Normocephalic and atraumatic.  Eyes: Pupils are equal, round, and reactive to light.  Neck:  Neck supple.  Cardiovascular: Normal rate, regular rhythm and normal heart sounds.  No murmur heard. Pulmonary/Chest: Effort normal and breath sounds normal. No respiratory distress. He has no wheezes.  Abdominal: Soft. Bowel sounds are normal. There is tenderness in the left lower quadrant. There is no rebound and no guarding.  Tenderness to the left lower quadrant, distractible  Musculoskeletal: He exhibits no edema.  Lymphadenopathy:    He has no cervical adenopathy.  Neurological: He is alert and oriented to person, place, and time.  Skin: Skin is warm and dry.  Psychiatric: He has a normal mood and affect.  Nursing note and vitals reviewed.    ED Treatments / Results  Labs (all labs ordered are listed, but only abnormal results are displayed) Labs Reviewed  CBC WITH DIFFERENTIAL/PLATELET - Abnormal; Notable for the following components:      Result Value   WBC 11.0 (*)    Abs Immature Granulocytes 0.12 (*)    All other components within normal limits  COMPREHENSIVE METABOLIC PANEL - Abnormal; Notable for the following components:   Glucose, Bld 100 (*)    All other components within normal limits  LIPASE, BLOOD  URINALYSIS, ROUTINE W REFLEX MICROSCOPIC    EKG None  Radiology Ct Abdomen Pelvis W Contrast  Result Date: 04/28/2018 CLINICAL DATA:  42 year old male with abdominal pain. History of pancreatitis. EXAM: CT ABDOMEN AND PELVIS WITH CONTRAST TECHNIQUE: Multidetector CT imaging of the abdomen and pelvis was performed using the standard protocol following bolus administration of intravenous contrast. CONTRAST:  165mL ISOVUE-300 IOPAMIDOL (ISOVUE-300) INJECTION 61% COMPARISON:  CT dated 12/05/2017 FINDINGS: Lower chest: Minimal bibasilar dependent atelectasis. The visualized lung bases are otherwise clear. No intra-abdominal free air or free fluid. Hepatobiliary: Diffuse fatty infiltration of the liver. Cholecystectomy with mild dilatation of the intrahepatic biliary tree  and pneumobilia. No retained calcified stone noted in the central CBD. Pancreas: Unremarkable. No pancreatic ductal dilatation or surrounding inflammatory changes. Spleen: Normal in size without focal abnormality. Adrenals/Urinary Tract: The adrenal glands are unremarkable. There is no hydronephrosis on either side. There is symmetric enhancement and excretion of contrast by both kidneys. The visualized ureters and urinary bladder appear unremarkable. Stomach/Bowel: There is no bowel obstruction or active inflammation. Appendectomy. Vascular/Lymphatic: No significant vascular findings are present. No enlarged abdominal or pelvic lymph nodes. Reproductive: The prostate and seminal vesicles are grossly unremarkable. Coarse calcification of the prostate gland. Other: None Musculoskeletal: No acute or significant osseous findings. IMPRESSION: 1. No acute intra-abdominal or pelvic pathology. No bowel obstruction or active inflammation. 2. Fatty liver. 3. Cholecystectomy with mild intrahepatic biliary ductal dilatation and pneumobilia. Electronically Signed   By: Anner Crete M.D.   On: 04/28/2018 02:55    Procedures Procedures (including critical care  time)  Medications Ordered in ED Medications  ondansetron (ZOFRAN) injection 4 mg (4 mg Intravenous Given 04/28/18 0155)  fentaNYL (SUBLIMAZE) injection 50 mcg (50 mcg Intravenous Given 04/28/18 0155)  iopamidol (ISOVUE-300) 61 % injection 100 mL (100 mLs Intravenous Contrast Given 04/28/18 0240)  oxyCODONE-acetaminophen (PERCOCET/ROXICET) 5-325 MG per tablet 2 tablet (2 tablets Oral Given 04/28/18 0314)     Initial Impression / Assessment and Plan / ED Course  I have reviewed the triage vital signs and the nursing notes.  Pertinent labs & imaging results that were available during my care of the patient were reviewed by me and considered in my medical decision making (see chart for details).     She presents with abdominal pain.  Left lower quadrant.   Ongoing and waxing and waning over the last month.  He has not had his outpatient CT scan.  I have reviewed his notes and lab work which have been reassuring thus far.  He is overall nontoxic-appearing and vital signs are reassuring.  He has some distractible tenderness on exam but no objective peritonitis.  He denies any other significant symptoms.  Lab work remains reassuring at this time.  CT scan obtained given patient's ongoing complaints.  CT scan is negative.  No obvious cause.  Patient was updated.  He is able to tolerate fluids.  He takes oxycodone as an outpatient.  He may continue this and follow-up with his primary physician and GI doctor.  After history, exam, and medical workup I feel the patient has been appropriately medically screened and is safe for discharge home. Pertinent diagnoses were discussed with the patient. Patient was given return precautions.   Final Clinical Impressions(s) / ED Diagnoses   Final diagnoses:  LLQ pain    ED Discharge Orders    None       Horton, Barbette Hair, MD 04/28/18 [phone]

## 2018-04-28 NOTE — Discharge Instructions (Addendum)
You were seen today for abdominal pain.  Your CT scan is negative.  There is no obvious source of your pain.  Follow-up with your primary doctor and GI doctor for further evaluation.  You may take your pain medications at home.

## 2018-04-28 NOTE — ED Triage Notes (Signed)
Pt reports left lower abd pain that started on Friday morning. Pt denies vomiting but does endorse nausea.

## 2018-04-28 NOTE — ED Notes (Signed)
Patient transported to CT 

## 2018-04-28 NOTE — ED Notes (Signed)
Pt made aware of needing urine specimen

## 2018-04-28 NOTE — ED Notes (Signed)
Pt understood dc material. NAD noted. 

## 2018-04-28 NOTE — ED Notes (Signed)
Pt was given ginger ale for PO Challenge

## 2018-04-29 ENCOUNTER — Ambulatory Visit: Payer: BLUE CROSS/BLUE SHIELD | Admitting: Family Medicine

## 2018-04-29 ENCOUNTER — Encounter: Payer: Self-pay | Admitting: Family Medicine

## 2018-04-29 VITALS — BP 130/82 | HR 96 | Temp 98.5°F | Resp 12 | Ht 72.0 in | Wt 216.2 lb

## 2018-04-29 DIAGNOSIS — G8929 Other chronic pain: Secondary | ICD-10-CM

## 2018-04-29 DIAGNOSIS — K861 Other chronic pancreatitis: Secondary | ICD-10-CM

## 2018-04-29 DIAGNOSIS — R1013 Epigastric pain: Secondary | ICD-10-CM | POA: Diagnosis not present

## 2018-04-29 DIAGNOSIS — R1010 Upper abdominal pain, unspecified: Secondary | ICD-10-CM

## 2018-04-29 DIAGNOSIS — I10 Essential (primary) hypertension: Secondary | ICD-10-CM | POA: Diagnosis not present

## 2018-04-29 DIAGNOSIS — R109 Unspecified abdominal pain: Principal | ICD-10-CM

## 2018-04-29 DIAGNOSIS — K838 Other specified diseases of biliary tract: Secondary | ICD-10-CM

## 2018-04-29 MED ORDER — OXYCODONE HCL 15 MG PO TABS: 15.0000 mg | ORAL_TABLET | Freq: Two times a day (BID) | ORAL | 0 refills | Status: DC | PRN

## 2018-04-29 MED ORDER — PROMETHAZINE HCL 25 MG PO TABS: 25.0000 mg | ORAL_TABLET | Freq: Three times a day (TID) | ORAL | 3 refills | Status: DC | PRN

## 2018-04-29 NOTE — Patient Instructions (Addendum)
A few things to remember from today's visit:   Chronic abdominal pain - Plan: oxyCODONE (ROXICODONE) 15 MG immediate release tablet  Chronic biliary pancreatitis ([place]) - Plan: promethazine (PHENERGAN) 25 MG tablet, oxyCODONE (ROXICODONE) 15 MG immediate release tablet  Hypertension, essential, benign  Pneumobilia - Plan: Ambulatory referral to General Surgery   Please be sure medication list is accurate. If a new problem present, please set up appointment sooner than planned today.

## 2018-04-29 NOTE — Progress Notes (Signed)
HPI:   [name] is a 42 y.o. male, who is here today for 2 months follow up.   He was last seen on 04/18/18 ,when he was still c/o LLQ abdominal pain. Initially he was treated asevaluated in the ED due to LLQ abdomen diverticulitis but not better after completing abx treatment. Pain has been going on for about 4 to 5 weeks.  Denies fever,chills,or body aches.  Pain is gradually getting worse. Nausea,which he thinks is more related to his chronic pancreatitis.  He was in the ER yesterday again due to left-sided abdominal pain. No new associated symptoms.  Lab Results  Component Value Date   WBC 11.0 (H) 04/28/2018   HGB 16.7 04/28/2018   HCT 49.0 04/28/2018   MCV 87.5 04/28/2018   PLT 205 04/28/2018   Lab Results  Component Value Date   ALT 37 04/28/2018   AST 27 04/28/2018   ALKPHOS 50 04/28/2018   BILITOT 0.7 04/28/2018   Lab Results  Component Value Date   LIPASE 34 04/28/2018     CT abdomen pelvis with contrast 04/28/18:  1. No acute intra-abdominal or pelvic pathology. No bowel obstruction or active inflammation. 2. Fatty liver. 3. Cholecystectomy with mild intrahepatic biliary ductal dilatation and pneumobilia.  Chronic epigastric and RUQ pain, thought to be related to chronic pancreatitis. S/P cholecystectomy in his 20's. Nausea and vomiting intermittently.  Appt with GI is pending. He already underwent studies that included ERCP x 2. He needed to be seen again by local GI before he could see GI in [place].  He is on Oxycodone  15 mg bid as needed,Phenergan 25 mg and Zofran 4 mg tid prn for nausea and/or vomiting. Denies side effects from medications. RUQ-epigastric pain is stable. He has not identified exacerbating or alleviating factors. Denies diarrhea or changes in bowel habits.  Hypertension:  Currently on Metoprolol  Succinate 50 mg daily. He is taking medications as instructed, no side effects reported.  He has not noted  unusual headache, visual changes, exertional chest pain, dyspnea,  focal weakness, or edema.   Lab Results  Component Value Date   CREATININE 0.91 04/28/2018   BUN 15 04/28/2018   NA 139 04/28/2018   K 3.8 04/28/2018   CL 108 04/28/2018   CO2 23 04/28/2018     Review of Systems  Constitutional: Positive for fatigue. Negative for appetite change and fever.  HENT: Negative for mouth sores, nosebleeds, sore throat and trouble swallowing.   Eyes: Negative for redness and visual disturbance.  Respiratory: Negative for cough, shortness of breath and wheezing.   Cardiovascular: Negative for palpitations and leg swelling.  Gastrointestinal: Positive for abdominal pain, nausea and vomiting. Negative for blood in stool.  Genitourinary: Negative for decreased urine volume, dysuria and hematuria.  Musculoskeletal: Negative for gait problem and myalgias.  Skin: Negative for pallor and rash.  Neurological: Negative for syncope, weakness and headaches.  Psychiatric/Behavioral: Negative for confusion. The patient is nervous/anxious.     Current Outpatient Medications on File Prior to Visit  Medication Sig Dispense Refill  . metoprolol succinate (TOPROL-XL) 50 MG 24 hr tablet Take 1 tablet (50 mg total) by mouth 2 (two) times daily. 30 tablet 0  . ondansetron (ZOFRAN) 4 MG tablet Take 1 tablet (4 mg total) by mouth every 8 (eight) hours as needed. 6 tablet 0   No current facility-administered medications on file prior to visit.      Past Medical History:  Diagnosis Date  .  Chronic abdominal pain   . Diverticulitis   . Drug-seeking behavior   . Hypertension   . Kidney stone   . Kidney stones   . Pancreatitis, chronic ([place]) 05/28/2005   Allergies  Allergen Reactions  . Cortisone Other (See Comments)    drops potassium level "bottoms out" potassium level drops potassium level Bottoms out potassium  "bottoms out" potassium level  . Eggs Or Egg-Derived Products Hives and Other (See  Comments)    Rash  Rash  . Ketorolac Tromethamine Hives and Shortness Of Breath  . Haloperidol Other (See Comments)    "jittery" "jittery"   . Hydrocortisone Hives    Also drops potassium level  . Iodinated Diagnostic Agents Nausea And Vomiting and Other (See Comments)    Hives Hives Hives Hives  . Ketorolac Tromethamine Hives  . Haldol [Haloperidol Lactate] Other (See Comments)    "jittery"   . Ketorolac Hives and Other (See Comments)    Hives, slightly labored breathing  Hives, slightly labored breathing    Social History   Socioeconomic History  . Marital status: Married    Spouse name: Not on file  . Number of children: Not on file  . Years of education: Not on file  . Highest education level: Not on file  Occupational History  . Not on file  Social Needs  . Financial resource strain: Not on file  . Food insecurity:    Worry: Not on file    Inability: Not on file  . Transportation needs:    Medical: Not on file    Non-medical: Not on file  Tobacco Use  . Smoking status: Current Every Day Smoker    Packs/day: 0.50    Types: Cigarettes  . Smokeless tobacco: Never Used  Substance and Sexual Activity  . Alcohol use: Yes    Comment: rarely, pt stated that he drinks once ever 3 months and it is usually a beer at dinner  . Drug use: No  . Sexual activity: Yes    Partners: Female  Lifestyle  . Physical activity:    Days per week: Not on file    Minutes per session: Not on file  . Stress: Not on file  Relationships  . Social connections:    Talks on phone: Not on file    Gets together: Not on file    Attends religious service: Not on file    Active member of club or organization: Not on file    Attends meetings of clubs or organizations: Not on file    Relationship status: Not on file  Other Topics Concern  . Not on file  Social History Narrative  . Not on file    Vitals:   04/29/18 0719  BP: 130/82  Pulse: 96  Resp: 12  Temp: 98.5 F (36.9  C)  SpO2: 97%   Body mass index is 29.33 kg/m. Wt Readings from Last 3 Encounters:  04/29/18 216 lb 4 oz (98.1 kg)  04/28/18 200 lb (90.7 kg)  04/18/18 212 lb 8 oz (96.4 kg)     Physical Exam  Nursing note and vitals reviewed. Constitutional: He is oriented to person, place, and time. He appears well-developed and well-nourished. No distress.  HENT:  Head: Normocephalic and atraumatic.  Mouth/Throat: Oropharynx is clear and moist and mucous membranes are normal.  Eyes: Pupils are equal, round, and reactive to light. Conjunctivae are normal. No scleral icterus.  Cardiovascular: Normal rate and regular rhythm.  No murmur heard. Respiratory: Effort normal  and breath sounds normal. No respiratory distress.  GI: Soft. Bowel sounds are normal. He exhibits no mass. There is no hepatomegaly. There is tenderness in the epigastric area, left upper quadrant and left lower quadrant. There is no rigidity, no rebound and no guarding.    Musculoskeletal: He exhibits no edema.  Lymphadenopathy:    He has no cervical adenopathy.  Neurological: He is alert and oriented to person, place, and time. He has normal strength. Gait normal.  Skin: Skin is warm. No erythema.  Psychiatric: His mood appears anxious.  Well groomed,good eye contact.      ASSESSMENT AND PLAN:   [name] was seen today for 2 months follow-up.  Orders Placed This Encounter  Procedures  . Ambulatory referral to General Surgery    Chronic abdominal pain RUQ and epigastric pain stable but left abdominal pain getting worse. Abdominal studies have not identified explanation for pain. Med contract was signed last OV. Educated about general recommendations about chronic opioid use. I tried to referred to pain clinic but he was not accepted because they "do not "treat GI related pain. No changes in current management.  -     oxyCODONE (ROXICODONE) 15 MG immediate release tablet; Take 1 tablet (15 mg total) by  mouth 2 (two) times daily as needed for pain.  Chronic biliary pancreatitis ([place]) Pending GI appt. No changes in current management for pain and nausea/vomiting.  -     promethazine (PHENERGAN) 25 MG tablet; Take 1 tablet (25 mg total) by mouth every 8 (eight) hours as needed. -     oxyCODONE (ROXICODONE) 15 MG immediate release tablet; Take 1 tablet (15 mg total) by mouth 2 (two) times daily as needed for pain.  Hypertension, essential, benign Adequately controlled. No changes in current management. Low salt diet to continue. Eye exam recommended annually. F/U in 6 months, before if needed.  Pneumobilia Not sure if this finding is contributing to his chronic RUQ pain/N/V. We will ask for surgeon consultation.  -     Ambulatory referral to General Surgery      G. Martinique, MD  [facility]. [place] office.

## 2018-05-01 DIAGNOSIS — H5203 Hypermetropia, bilateral: Secondary | ICD-10-CM | POA: Diagnosis not present

## 2018-05-01 DIAGNOSIS — H52223 Regular astigmatism, bilateral: Secondary | ICD-10-CM | POA: Diagnosis not present

## 2018-05-01 DIAGNOSIS — H524 Presbyopia: Secondary | ICD-10-CM | POA: Diagnosis not present

## 2018-05-04 ENCOUNTER — Encounter: Payer: Self-pay | Admitting: Family Medicine

## 2018-05-15 ENCOUNTER — Encounter: Payer: Self-pay | Admitting: Internal Medicine

## 2018-05-26 ENCOUNTER — Other Ambulatory Visit: Payer: Self-pay | Admitting: Family Medicine

## 2018-05-26 DIAGNOSIS — R109 Unspecified abdominal pain: Secondary | ICD-10-CM

## 2018-05-26 DIAGNOSIS — G8929 Other chronic pain: Secondary | ICD-10-CM

## 2018-05-26 DIAGNOSIS — K861 Other chronic pancreatitis: Secondary | ICD-10-CM

## 2018-05-26 NOTE — Telephone Encounter (Signed)
Copied from [place] [id number]. Topic: Quick Communication - Rx Refill/Question >> May 26, 2018 10:33 AM Virl Axe D wrote: Medication: oxyCODONE (ROXICODONE) 15 MG immediate release tablet / Pt stated pharmacy instructed him to contact office for refill. Aware of note that stated he needed to schedule with Chronic Pain Mgmt  Has the patient contacted their pharmacy? Yes.   (Agent: If no, request that the patient contact the pharmacy for the refill.) (Agent: If yes, when and what did the pharmacy advise?)  Preferred Pharmacy (with phone number or street name): [id number] - [place], [name] [phone] (Phone) [phone] (Fax)    Agent: Please be advised that RX refills may take up to 3 business days. We ask that you follow-up with your pharmacy.

## 2018-05-30 MED ORDER — OXYCODONE HCL 15 MG PO TABS: 15.0000 mg | ORAL_TABLET | Freq: Two times a day (BID) | ORAL | 0 refills | Status: DC | PRN

## 2018-06-11 DIAGNOSIS — K861 Other chronic pancreatitis: Secondary | ICD-10-CM | POA: Diagnosis not present

## 2018-06-11 DIAGNOSIS — R1010 Upper abdominal pain, unspecified: Secondary | ICD-10-CM | POA: Diagnosis not present

## 2018-06-11 DIAGNOSIS — Z91012 Allergy to eggs: Secondary | ICD-10-CM | POA: Diagnosis not present

## 2018-06-11 DIAGNOSIS — F17210 Nicotine dependence, cigarettes, uncomplicated: Secondary | ICD-10-CM | POA: Diagnosis not present

## 2018-06-11 DIAGNOSIS — R112 Nausea with vomiting, unspecified: Secondary | ICD-10-CM | POA: Diagnosis not present

## 2018-06-11 DIAGNOSIS — R1011 Right upper quadrant pain: Secondary | ICD-10-CM | POA: Diagnosis not present

## 2018-06-11 DIAGNOSIS — Z9089 Acquired absence of other organs: Secondary | ICD-10-CM | POA: Diagnosis not present

## 2018-06-11 DIAGNOSIS — Z87442 Personal history of urinary calculi: Secondary | ICD-10-CM | POA: Diagnosis not present

## 2018-06-11 DIAGNOSIS — Z888 Allergy status to other drugs, medicaments and biological substances status: Secondary | ICD-10-CM | POA: Diagnosis not present

## 2018-06-11 DIAGNOSIS — R1012 Left upper quadrant pain: Secondary | ICD-10-CM | POA: Diagnosis not present

## 2018-06-11 DIAGNOSIS — Z79899 Other long term (current) drug therapy: Secondary | ICD-10-CM | POA: Diagnosis not present

## 2018-06-11 DIAGNOSIS — Z886 Allergy status to analgesic agent status: Secondary | ICD-10-CM | POA: Diagnosis not present

## 2018-06-11 DIAGNOSIS — Z9049 Acquired absence of other specified parts of digestive tract: Secondary | ICD-10-CM | POA: Diagnosis not present

## 2018-06-13 ENCOUNTER — Encounter: Payer: Self-pay | Admitting: Internal Medicine

## 2018-06-13 ENCOUNTER — Ambulatory Visit: Payer: BLUE CROSS/BLUE SHIELD | Admitting: Internal Medicine

## 2018-06-13 VITALS — BP 150/92 | HR 92 | Ht 73.0 in | Wt 212.0 lb

## 2018-06-13 DIAGNOSIS — K838 Other specified diseases of biliary tract: Secondary | ICD-10-CM

## 2018-06-13 DIAGNOSIS — R1013 Epigastric pain: Secondary | ICD-10-CM | POA: Diagnosis not present

## 2018-06-13 DIAGNOSIS — R935 Abnormal findings on diagnostic imaging of other abdominal regions, including retroperitoneum: Secondary | ICD-10-CM

## 2018-06-13 DIAGNOSIS — R1032 Left lower quadrant pain: Secondary | ICD-10-CM | POA: Diagnosis not present

## 2018-06-13 DIAGNOSIS — R194 Change in bowel habit: Secondary | ICD-10-CM

## 2018-06-13 MED ORDER — PEG-KCL-NACL-NASULF-NA ASC-C 140 G PO SOLR: 1.0000 | Freq: Once | ORAL | 0 refills | Status: AC

## 2018-06-13 NOTE — Patient Instructions (Signed)

## 2018-06-16 ENCOUNTER — Encounter: Payer: Self-pay | Admitting: Internal Medicine

## 2018-06-16 NOTE — Progress Notes (Signed)
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 Martinique regarding chronic abdominal pain and abnormal x-ray.  The patient has an incredibly extensive and complicated past history.  In short, he has had chronic abdominal pain for at least 15 years.  He is maintained on chronic narcotics.  He has seen multiple GI providers here in [place], at tertiary care centers, and elsewhere.  In short, he is status post remote cholecystectomy.  He carries the diagnosis of "chronic pancreatitis".  He also has a history of kidney stones.  He is accompanied today by his wife with a chief complaint of epigastric and left-sided abdominal pain.  Reviewing his record for similar complaints he has had multiple lipase levels which for the most part have been overwhelmingly normal.  On several occasions less than 2 times the upper limit of normal.  I have seen at least 20 CT scans and MR studies of the abdomen with no significant pancreatic abnormality identified.  He is also undergone endoscopic ultrasound on several occasions.  Subtle pancreatic changes at best.  At some point he underwent ERCP with sphincterotomy.  He may have had a temporary pancreatic stent in place.  Last upper endoscopy elsewhere 2 years ago was negative except for mild gastritis.  For his chronic pain he takes oxycodone 15 mg twice daily.  Phenergan and Zofran as needed.  Patient describes his pain is daily and constant.  Occasionally worse with spicy food.  His bowel habits alternate.  He will experience occasional nausea with vomiting.  His weight can fluctuate but for the most part has been stable.  He does report occasional minor rectal bleeding.  There was concern over his CT scan showing pneumobilia.  However, he is status post biliary sphincterotomy liver tests have been repeatedly normal.  Other blood work unrevealing.  He has been previously  under the care of both pain management and psychiatry per the EMR.  REVIEW OF SYSTEMS:  All non-GI ROS negative unless otherwise stated in the HPI except for back pain  Past Medical History:  Diagnosis Date  . Chronic abdominal pain   . Diverticulitis   . Drug-seeking behavior   . Hypertension   . Kidney stone   . Kidney stones   . Pancreatitis, chronic ([place]) 05/28/2005    Past Surgical History:  Procedure Laterality Date  . APPENDECTOMY    . CHOLECYSTECTOMY    . ERCP W/ METAL STENT PLACEMENT    . KIDNEY STONE SURGERY      Social History Benjimin Hadden  reports that he has been smoking cigarettes. He has been smoking about 0.50 packs per day. He has never used smokeless tobacco. He reports current alcohol use. He reports that he does not use drugs.  family history includes Cancer in his mother; Early death in his mother; Heart disease in his father; Heart failure in his father; Hyperlipidemia in his father; Hypertension in his father and mother; Pancreatic cancer in his paternal aunt and paternal grandmother.  Allergies  Allergen Reactions  . Cortisone Other (See Comments)    drops potassium level "bottoms out" potassium level drops potassium level Bottoms out potassium  "bottoms out" potassium level  . Eggs Or Egg-Derived Products Hives and Other (See Comments)    Rash  Rash  . Ketorolac Tromethamine Hives and Shortness Of Breath  . Haloperidol Other (See Comments)    "jittery" "jittery"   .  Hydrocortisone Hives    Also drops potassium level  . Iodinated Diagnostic Agents Nausea And Vomiting and Other (See Comments)    Hives Hives Hives Hives  . Ketorolac Tromethamine Hives  . Haldol [Haloperidol Lactate] Other (See Comments)    "jittery"   . Ketorolac Hives and Other (See Comments)    Hives, slightly labored breathing  Hives, slightly labored breathing  . Reglan [Metoclopramide]     Face "draws" and gets figgety       PHYSICAL  EXAMINATION: Vital signs: BP (!) 150/92   Pulse 92   Ht 6\' 1"  (1.854 m)   Wt 212 lb (96.2 kg)   BMI 27.97 kg/m   Constitutional: Pleasant, generally well-appearing, no acute distress Psychiatric: alert and oriented x3, cooperative Eyes: extraocular movements intact, anicteric, conjunctiva pink Mouth: oral pharynx moist, no lesions Neck: supple no lymphadenopathy Cardiovascular: heart regular rate and rhythm, no murmur Lungs: clear to auscultation bilaterally Abdomen: soft, muscle wall tenderness with minimal palpation, nondistended, no obvious ascites, no peritoneal signs, normal bowel sounds, no organomegaly Rectal: Deferred until colonoscopy Extremities: no rubbing, cyanosis, or lower extremity edema bilaterally Skin: no lesions on visible extremities Neuro: No focal deficits.  Cranial nerves intact  ASSESSMENT:  1.  Chronic abdominal pain.  Through my extensive search of the medical record it has been difficult to find specific diagnoses that would correlate with his clinical picture and the type and degree of pain that he reports.  Drug-seeking behavior is a concern 2.  Chronic narcotic use.  This may result in significant GI disturbance including paradoxical pain, alternating bowel habits, dysmotility, and intermittent nausea with vomiting.  We did discuss this today. 3.  Reported history of chronic pancreatitis.  No significant objective abnormalities identified 4.  Alternating bowel habits and intermittent nausea with vomiting.  May be narcotic effect 5.  Status post cholecystectomy 6.  Status post ERCP with biliary sphincterotomy. 7.  Pneumobilia.  Normal finding post biliary sphincterotomy 8.  Tobacco abuse  PLAN:  1.  This patient reports that his pain is seemingly worse in recent months and he has had more difficulties with his bowel habits, including diarrhea in recent months (as well as minor rectal bleeding) and nothing else to explain pain based on x-rays and  laboratories, we will proceed with colonoscopy and upper endoscopy to be certain that we are not missing anything.The nature of the procedure, as well as the risks, benefits, and alternatives were carefully and thoroughly reviewed with the patient. Ample time for discussion and questions allowed. The patient understood, was satisfied, and agreed to proceed.  Copy this consultation note has been sent to Dr. Martinique

## 2018-06-20 ENCOUNTER — Ambulatory Visit (AMBULATORY_SURGERY_CENTER): Payer: BLUE CROSS/BLUE SHIELD | Admitting: Internal Medicine

## 2018-06-20 ENCOUNTER — Encounter: Payer: Self-pay | Admitting: Internal Medicine

## 2018-06-20 VITALS — BP 119/77 | HR 70 | Temp 99.1°F | Resp 18 | Ht 73.0 in | Wt 212.0 lb

## 2018-06-20 DIAGNOSIS — K635 Polyp of colon: Secondary | ICD-10-CM

## 2018-06-20 DIAGNOSIS — R194 Change in bowel habit: Secondary | ICD-10-CM

## 2018-06-20 DIAGNOSIS — D122 Benign neoplasm of ascending colon: Secondary | ICD-10-CM

## 2018-06-20 DIAGNOSIS — R1032 Left lower quadrant pain: Secondary | ICD-10-CM

## 2018-06-20 DIAGNOSIS — K5140 Inflammatory polyps of colon without complications: Secondary | ICD-10-CM

## 2018-06-20 DIAGNOSIS — D124 Benign neoplasm of descending colon: Secondary | ICD-10-CM

## 2018-06-20 DIAGNOSIS — R1013 Epigastric pain: Secondary | ICD-10-CM

## 2018-06-20 MED ORDER — SODIUM CHLORIDE 0.9 % IV SOLN: 500.0000 mL | Freq: Once | INTRAVENOUS | Status: DC

## 2018-06-20 NOTE — Op Note (Signed)
[facility] Patient Name: [name] Procedure Date: 06/20/2018 3:55 PM MRN: [id number] Endoscopist: Docia Chuck. Henrene Pastor , MD Age: 43 Referring MD:  Date of Birth: 12-24-1975 Gender: Male Account #: [id number] Procedure:                Upper GI endoscopy biopsies Indications:              Epigastric abdominal pain Medicines:                Monitored Anesthesia Care Procedure:                Pre-Anesthesia Assessment:                           - Prior to the procedure, a History and Physical                            was performed, and patient medications and                            allergies were reviewed. The patient's tolerance of                            previous anesthesia was also reviewed. The risks                            and benefits of the procedure and the sedation                            options and risks were discussed with the patient.                            All questions were answered, and informed consent                            was obtained. Prior Anticoagulants: The patient has                            taken no previous anticoagulant or antiplatelet                            agents. ASA Grade Assessment: II - A patient with                            mild systemic disease. After reviewing the risks                            and benefits, the patient was deemed in                            satisfactory condition to undergo the procedure.                           After obtaining informed consent, the endoscope was  passed under direct vision. Throughout the                            procedure, the patient's blood pressure, pulse, and                            oxygen saturations were monitored continuously. The                            Endoscope was introduced through the mouth, and                            advanced to the second part of duodenum. The upper                            GI endoscopy was  accomplished without difficulty.                            The patient tolerated the procedure well. Scope In: Scope Out: 4:27:48 PM Findings:                 The esophagus was normal.                           The stomach was normal. Biopsies were taken with a                            cold forceps for Helicobacter pylori testing using                            CLOtest.                           The examined duodenum revealed very mild erythema                            but was otherwise normal.                           The cardia and gastric fundus were normal on                            retroflexion. Complications:            No immediate complications. Estimated Blood Loss:     Estimated blood loss: none. Impression:               1. Essentially normal EGD. Mild duodenal erythema                            status post CLO biopsy                           2. No primary GI cause for chronic abdominal pain  identified or suspected Recommendation:           - Patient has a contact number available for                            emergencies. The signs and symptoms of potential                            delayed complications were discussed with the                            patient. Return to normal activities tomorrow.                            Written discharge instructions were provided to the                            patient.                           - Resume previous diet.                           - Continue present medications.                           - Return to the care of your primary provider Docia Chuck. Henrene Pastor, MD 06/20/2018 4:32:44 PM This report has been signed electronically.

## 2018-06-20 NOTE — Progress Notes (Signed)
Pt's states no medical or surgical changes since previsit or office visit. 

## 2018-06-20 NOTE — Op Note (Signed)
[facility] Patient Name: [name] Procedure Date: 06/20/2018 3:56 PM MRN: [id number] Endoscopist: Docia Chuck. Henrene Pastor , MD Age: 43 Referring MD:  Date of Birth: 11/15/1975 Gender: Male Account #: [id number] Procedure:                Colonoscopy with cold snare polypectomy x 1 Indications:              Abdominal pain in the left lower quadrant, Change                            in bowel habits Medicines:                Monitored Anesthesia Care Procedure:                Pre-Anesthesia Assessment:                           - Prior to the procedure, a History and Physical                            was performed, and patient medications and                            allergies were reviewed. The patient's tolerance of                            previous anesthesia was also reviewed. The risks                            and benefits of the procedure and the sedation                            options and risks were discussed with the patient.                            All questions were answered, and informed consent                            was obtained. Prior Anticoagulants: The patient has                            taken no previous anticoagulant or antiplatelet                            agents. ASA Grade Assessment: II - A patient with                            mild systemic disease. After reviewing the risks                            and benefits, the patient was deemed in                            satisfactory condition to undergo the procedure.  After obtaining informed consent, the colonoscope                            was passed under direct vision. Throughout the                            procedure, the patient's blood pressure, pulse, and                            oxygen saturations were monitored continuously. The                            Colonoscope was introduced through the anus and                            advanced to the the  cecum, identified by                            appendiceal orifice and ileocecal valve. The                            ileocecal valve, appendiceal orifice, and rectum                            were photographed. The quality of the bowel                            preparation was excellent. The colonoscopy was                            performed without difficulty. The patient tolerated                            the procedure well. The bowel preparation used was                            SUPREP. Scope In: 4:05:47 PM Scope Out: 4:19:33 PM Scope Withdrawal Time: 0 hours 10 minutes 50 seconds  Total Procedure Duration: 0 hours 13 minutes 46 seconds  Findings:                 Two polyps were found in the descending colon and                            ascending colon. The polyps were 1 to 3 mm in size.                            These polyps were removed with a cold snare.                            Resection and retrieval were complete.                           The exam was otherwise without abnormality on  direct and retroflexion views. Complications:            No immediate complications. Estimated blood loss:                            None. Estimated Blood Loss:     Estimated blood loss: none. Impression:               - Two 1 to 3 mm polyps in the descending colon and                            in the ascending colon, removed with a cold snare.                            Resected and retrieved.                           - The examination was otherwise normal on direct                            and retroflexion views. Recommendation:           - Repeat colonoscopy in 5 years if either polyp                            adenomatous. Otherwise 10 years.                           - Patient has a contact number available for                            emergencies. The signs and symptoms of potential                            delayed complications were  discussed with the                            patient. Return to normal activities tomorrow.                            Written discharge instructions were provided to the                            patient.                           - Resume previous diet.                           - Continue present medications.                           - Await pathology results. Docia Chuck. Henrene Pastor, MD 06/20/2018 4:22:58 PM This report has been signed electronically.

## 2018-06-20 NOTE — Progress Notes (Signed)
Called to room to assist during endoscopic procedure.  Patient ID and intended procedure confirmed with present staff. Received instructions for my participation in the procedure from the performing physician.  

## 2018-06-20 NOTE — Patient Instructions (Addendum)
YOU HAD AN ENDOSCOPIC PROCEDURE TODAY AT THE [facility]:   Refer to the procedure report that was given to you for any specific questions about what was found during the examination.  If the procedure report does not answer your questions, please call your gastroenterologist to clarify.  If you requested that your care partner not be given the details of your procedure findings, then the procedure report has been included in a sealed envelope for you to review at your convenience later.  YOU SHOULD EXPECT: Some feelings of bloating in the abdomen. Passage of more gas than usual.  Walking can help get rid of the air that was put into your GI tract during the procedure and reduce the bloating. If you had a lower endoscopy (such as a colonoscopy or flexible sigmoidoscopy) you may notice spotting of blood in your stool or on the toilet paper. If you underwent a bowel prep for your procedure, you may not have a normal bowel movement for a few days.  Please Note:  You might notice some irritation and congestion in your nose or some drainage.  This is from the oxygen used during your procedure.  There is no need for concern and it should clear up in a day or so.  SYMPTOMS TO REPORT IMMEDIATELY:   Following lower endoscopy (colonoscopy or flexible sigmoidoscopy):  Excessive amounts of blood in the stool  Significant tenderness or worsening of abdominal pains  Swelling of the abdomen that is new, acute  Fever of 100F or higher   Following upper endoscopy (EGD)  Vomiting of blood or coffee ground material  New chest pain or pain under the shoulder blades  Painful or persistently difficult swallowing  New shortness of breath  Fever of 100F or higher  Black, tarry-looking stools  For urgent or emergent issues, a gastroenterologist can be reached at any hour by calling [phone].   DIET:  We do recommend a small meal at first, but then you may proceed to your regular diet.  Drink  plenty of fluids but you should avoid alcoholic beverages for 24 hours.  ACTIVITY:  You should plan to take it easy for the rest of today and you should NOT DRIVE or use heavy machinery until tomorrow (because of the sedation medicines used during the test).    FOLLOW UP: Our staff will call the number listed on your records the next business day following your procedure to check on you and address any questions or concerns that you may have regarding the information given to you following your procedure. If we do not reach you, we will leave a message.  However, if you are feeling well and you are not experiencing any problems, there is no need to return our call.  We will assume that you have returned to your regular daily activities without incident.  If any biopsies were taken you will be contacted by phone or by letter within the next 1-3 weeks.  Please call us at [phone] if you have not heard about the biopsies in 3 weeks.    SIGNATURES/CONFIDENTIALITY: You and/or your care partner have signed paperwork which will be entered into your electronic medical record.  These signatures attest to the fact that that the information above on your After Visit Summary has been reviewed and is understood.  Full responsibility of the confidentiality of this discharge information lies with you and/or your care-partner.  Read all handouts given to you by your recovery room nurse. 

## 2018-06-23 ENCOUNTER — Telehealth: Payer: Self-pay

## 2018-06-23 LAB — HELICOBACTER PYLORI SCREEN-BIOPSY: UREASE: NEGATIVE

## 2018-06-23 NOTE — Telephone Encounter (Signed)
Left message on f/u call 

## 2018-06-23 NOTE — Telephone Encounter (Signed)
NO ANSWER, MESSAGE LEFT FOR PATIENT. 

## 2018-06-26 ENCOUNTER — Encounter: Payer: Self-pay | Admitting: Internal Medicine

## 2018-06-30 ENCOUNTER — Ambulatory Visit: Payer: BLUE CROSS/BLUE SHIELD | Admitting: Family Medicine

## 2018-06-30 ENCOUNTER — Encounter: Payer: Self-pay | Admitting: Family Medicine

## 2018-06-30 ENCOUNTER — Other Ambulatory Visit: Payer: Self-pay

## 2018-06-30 VITALS — BP 164/90 | HR 75 | Temp 98.4°F | Resp 12 | Ht 72.0 in | Wt 216.8 lb

## 2018-06-30 DIAGNOSIS — I10 Essential (primary) hypertension: Secondary | ICD-10-CM | POA: Diagnosis not present

## 2018-06-30 DIAGNOSIS — R112 Nausea with vomiting, unspecified: Secondary | ICD-10-CM

## 2018-06-30 DIAGNOSIS — K861 Other chronic pancreatitis: Secondary | ICD-10-CM | POA: Diagnosis not present

## 2018-06-30 DIAGNOSIS — G8929 Other chronic pain: Secondary | ICD-10-CM

## 2018-06-30 DIAGNOSIS — R109 Unspecified abdominal pain: Secondary | ICD-10-CM

## 2018-06-30 MED ORDER — METOPROLOL SUCCINATE ER 100 MG PO TB24: 100.0000 mg | ORAL_TABLET | Freq: Every day | ORAL | 0 refills | Status: DC

## 2018-06-30 MED ORDER — OXYCODONE HCL 15 MG PO TABS: 15.0000 mg | ORAL_TABLET | Freq: Two times a day (BID) | ORAL | 0 refills | Status: DC | PRN

## 2018-06-30 NOTE — Patient Instructions (Signed)
A few things to remember from today's visit:   Hypertension, essential, benign - Plan: metoprolol succinate (TOPROL-XL) 100 MG 24 hr tablet  Chronic biliary pancreatitis (HCC) - Plan: oxyCODONE (ROXICODONE) 15 MG immediate release tablet  Chronic abdominal pain - Plan: oxyCODONE (ROXICODONE) 15 MG immediate release tablet  Nausea and vomiting in adult   Please be sure medication list is accurate. If a new problem present, please set up appointment sooner than planned today.

## 2018-06-30 NOTE — Progress Notes (Signed)
ACUTE VISIT   HPI:  Chief Complaint  Patient presents with  . Hypertension    Patient reports BP has been fluctuating.   . Medication Refill    [name] is a 43 y.o. male, who is here today complaining of persistently elevated BP's.  Currently on Metoprolol Succinate 50 mg ,he takes 1/2 tab bid. BP readings 150-160's/90.   Exacerbated by pain.  He is taking medications as instructed, no side effects reported.  He has not noted visual changes, exertional chest pain, dyspnea,  focal weakness, or edema. Occasional right frontal headache,"not bad." Last eye exam 04/2018.   Lab Results  Component Value Date   CREATININE 0.91 04/28/2018   BUN 15 04/28/2018   NA 139 04/28/2018   K 3.8 04/28/2018   CL 108 04/28/2018   CO2 23 04/28/2018    He is also requesting refills on Percocet 5-325 mg , which he takes bid for severe chronic abdominal pain. Pain is attributed to chronic pancreatitis. Pain is constant. Pain is mainly in upper abdomen but he also has intermittent diffused abdominal pain. Nausea,occasional vomiting.These symptoms also chronic and stable. He is on Phenergan 25 mg daily as needed.  He is already following with GI.   Review of Systems  Constitutional: Positive for fatigue. Negative for activity change, appetite change and fever.  HENT: Negative for nosebleeds and sore throat.   Eyes: Negative for redness and visual disturbance.  Respiratory: Negative for cough, shortness of breath and wheezing.   Cardiovascular: Negative for chest pain, palpitations and leg swelling.  Gastrointestinal: Positive for abdominal pain, nausea and vomiting.       No changes in bowel habits.  Genitourinary: Negative for decreased urine volume, dysuria and hematuria.  Neurological: Positive for headaches. Negative for dizziness, syncope and weakness.      Current Outpatient Medications on File Prior to Visit  Medication Sig Dispense Refill  . ondansetron  (ZOFRAN) 4 MG tablet Take 1 tablet (4 mg total) by mouth every 8 (eight) hours as needed. 6 tablet 0   No current facility-administered medications on file prior to visit.      Past Medical History:  Diagnosis Date  . Chronic abdominal pain   . Diverticulitis   . Drug-seeking behavior   . Hypertension   . Kidney stone   . Kidney stones   . Pancreatitis, chronic ([place]) 05/28/2005   Allergies  Allergen Reactions  . Cortisone Other (See Comments)    drops potassium level "bottoms out" potassium level drops potassium level Bottoms out potassium  "bottoms out" potassium level  . Eggs Or Egg-Derived Products Hives and Other (See Comments)    Rash  Rash  . Ketorolac Tromethamine Hives and Shortness Of Breath  . Haloperidol Other (See Comments)    "jittery" "jittery"   . Hydrocortisone Hives    Also drops potassium level  . Iodinated Diagnostic Agents Nausea And Vomiting and Other (See Comments)    Hives Hives Hives Hives  . Ketorolac Tromethamine Hives  . Haldol [Haloperidol Lactate] Other (See Comments)    "jittery"   . Ketorolac Hives and Other (See Comments)    Hives, slightly labored breathing  Hives, slightly labored breathing  . Reglan [Metoclopramide]     Face "draws" and gets figgety    Social History   Socioeconomic History  . Marital status: Married    Spouse name: Not on file  . Number of children: Not on file  . Years of education: Not  on file  . Highest education level: Not on file  Occupational History  . Not on file  Social Needs  . Financial resource strain: Not on file  . Food insecurity:    Worry: Not on file    Inability: Not on file  . Transportation needs:    Medical: Not on file    Non-medical: Not on file  Tobacco Use  . Smoking status: Current Every Day Smoker    Packs/day: 0.50    Types: Cigarettes  . Smokeless tobacco: Never Used  Substance and Sexual Activity  . Alcohol use: Yes    Comment: rarely, pt stated that he  drinks once ever 3 months and it is usually a beer at dinner  . Drug use: No  . Sexual activity: Yes    Partners: Female  Lifestyle  . Physical activity:    Days per week: Not on file    Minutes per session: Not on file  . Stress: Not on file  Relationships  . Social connections:    Talks on phone: Not on file    Gets together: Not on file    Attends religious service: Not on file    Active member of club or organization: Not on file    Attends meetings of clubs or organizations: Not on file    Relationship status: Not on file  Other Topics Concern  . Not on file  Social History Narrative  . Not on file    Vitals:   06/30/18 1054  BP: (!) 164/90  Pulse: 75  Resp: 12  Temp: 98.4 F (36.9 C)   Body mass index is 29.4 kg/m.   Physical Exam  Nursing note and vitals reviewed. Constitutional: He is oriented to person, place, and time. He appears well-developed. No distress.  HENT:  Head: Normocephalic and atraumatic.  Mouth/Throat: Oropharynx is clear and moist and mucous membranes are normal.  Eyes: Pupils are equal, round, and reactive to light. Conjunctivae are normal.  Cardiovascular: Normal rate and regular rhythm.  No murmur heard. Pulses:      Posterior tibial pulses are 2+ on the right side and 2+ on the left side.  Respiratory: Effort normal and breath sounds normal. No respiratory distress.  GI: Soft. He exhibits no mass. There is no hepatomegaly. There is abdominal tenderness. There is no rebound and no guarding.  Musculoskeletal:        General: No edema.  Lymphadenopathy:    He has no cervical adenopathy.  Neurological: He is alert and oriented to person, place, and time. He has normal strength. No cranial nerve deficit. Gait normal.  Skin: Skin is warm. No rash noted. No erythema.  Psychiatric: He has a normal mood and affect. Cognition and memory are normal.  Well groomed, good eye contact.    ASSESSMENT AND PLAN:  [name] was seen today for  hypertension and medication refill.  Diagnoses and all orders for this visit:  Hypertension, essential, benign We discussed a few pharmacologic options to add and side effects.He prefers trying to increase dose of Metoprolol before adding a new med. Metoprolol increased from 25 mg bid to 50 mg bid. Continue monitoring BP periodically. Low salt diet recommended. Instructed about warning signs. F/U in 2 months.  -     metoprolol succinate (TOPROL-XL) 100 MG 24 hr tablet; Take 1 tablet (100 mg total) by mouth daily. Take with or immediately following a meal.  Chronic biliary pancreatitis ([place]) Following with GI. According to pt,Bx results  are pending.  -     oxyCODONE (ROXICODONE) 15 MG immediate release tablet; Take 1 tablet (15 mg total) by mouth 2 (two) times daily as needed for pain.  Chronic abdominal pain [place] controlled subs reviewed. Med contract current. Side effects of medication reviewed.  -     oxyCODONE (ROXICODONE) 15 MG immediate release tablet; Take 1 tablet (15 mg total) by mouth 2 (two) times daily as needed for pain.  Nausea and vomiting in adult Continue phenergan 25 mg bid prn. Side effects discussed. Continue following with GI.      Return for keep next appt in 07/2018.      G. Martinique, MD  [facility]. [place] office.

## 2018-07-03 ENCOUNTER — Encounter: Payer: Self-pay | Admitting: Family Medicine

## 2018-07-03 MED ORDER — PROMETHAZINE HCL 25 MG PO TABS: 25.0000 mg | ORAL_TABLET | Freq: Two times a day (BID) | ORAL | 3 refills | Status: DC | PRN

## 2018-07-29 ENCOUNTER — Ambulatory Visit: Payer: BLUE CROSS/BLUE SHIELD | Admitting: Family Medicine

## 2018-07-29 ENCOUNTER — Encounter: Payer: Self-pay | Admitting: Family Medicine

## 2018-07-29 VITALS — BP 160/100 | HR 77 | Temp 98.5°F | Resp 12 | Ht 72.0 in | Wt 212.2 lb

## 2018-07-29 DIAGNOSIS — I10 Essential (primary) hypertension: Secondary | ICD-10-CM

## 2018-07-29 DIAGNOSIS — R519 Headache, unspecified: Secondary | ICD-10-CM

## 2018-07-29 DIAGNOSIS — R51 Headache: Secondary | ICD-10-CM

## 2018-07-29 MED ORDER — AMLODIPINE BESY-BENAZEPRIL HCL 5-10 MG PO CAPS: 1.0000 | ORAL_CAPSULE | Freq: Every day | ORAL | 1 refills | Status: DC

## 2018-07-29 NOTE — Patient Instructions (Signed)
A few things to remember from today's visit:   Hypertension, essential, benign - Plan: amLODipine-benazepril (LOTREL) 5-10 MG capsule, Basic metabolic panel  Headache, unspecified headache type  Lotrel today once daily started today. No changes in metoprolol. Continue monitoring your blood pressure at home. Lab appointment to check your kidney function and potassium in 7 to 10 days.  Headache does not seem worrisome but if suddenly gets worse or you start with other associated symptoms you need to seek immediate medical attention.  Please be sure medication list is accurate. If a new problem present, please set up appointment sooner than planned today.

## 2018-07-29 NOTE — Progress Notes (Signed)
[name] is a 43 y.o.male, who is here today to follow on HTN. Last follow up visit: 06/30/18,when Metoprolol succinate was increased to 100 mg daily.   He is taking medications as instructed, no side effects reported.  Denies exertional chest pain, dyspnea,  focal weakness, or edema. Home BP readings: 130's-150's/90's.  Lab Results  Component Value Date   CREATININE 0.91 04/28/2018   BUN 15 04/28/2018   NA 139 04/28/2018   K 3.8 04/28/2018   CL 108 04/28/2018   CO2 23 04/28/2018    3 days of right frontotemporal headache,"naggy" pain, intermittently, 3-4/10.  Occasionally associated with nausea. Denies associated visual changes, numbness, tingling, or MS changes. Denies Hx of headache. He has not identifies exacerbating or alleviating factors.  Problem is stable.  On chronic opioid use, Oxycodone 15 mg bid.   Review of Systems  Constitutional: Negative for activity change, appetite change, fatigue and fever.  HENT: Negative for nosebleeds, sore throat and trouble swallowing.   Eyes: Negative for redness and visual disturbance.  Respiratory: Negative for cough, shortness of breath and wheezing.   Cardiovascular: Negative for chest pain, palpitations and leg swelling.  Gastrointestinal: Positive for abdominal pain (Chronic) and nausea. Negative for vomiting.  Genitourinary: Negative for decreased urine volume and hematuria.  Neurological: Positive for headaches. Negative for dizziness, facial asymmetry, weakness and numbness.  Psychiatric/Behavioral: Negative for confusion. The patient is nervous/anxious.      Current Outpatient Medications on File Prior to Visit  Medication Sig Dispense Refill  . metoprolol succinate (TOPROL-XL) 100 MG 24 hr tablet Take 1 tablet (100 mg total) by mouth daily. Take with or immediately following a meal. 90 tablet 0  . ondansetron (ZOFRAN) 4 MG tablet Take 1 tablet (4 mg total) by mouth every 8 (eight) hours as needed. 6  tablet 0  . promethazine (PHENERGAN) 25 MG tablet Take 1 tablet (25 mg total) by mouth every 12 (twelve) hours as needed. 45 tablet 3   No current facility-administered medications on file prior to visit.      Past Medical History:  Diagnosis Date  . Chronic abdominal pain   . Diverticulitis   . Drug-seeking behavior   . Hypertension   . Kidney stone   . Kidney stones   . Pancreatitis, chronic ([place]) 05/28/2005    Allergies  Allergen Reactions  . Cortisone Other (See Comments)    drops potassium level "bottoms out" potassium level drops potassium level Bottoms out potassium  "bottoms out" potassium level  . Eggs Or Egg-Derived Products Hives and Other (See Comments)    Rash  Rash  . Ketorolac Tromethamine Hives and Shortness Of Breath  . Haloperidol Other (See Comments)    "jittery" "jittery"   . Hydrocortisone Hives    Also drops potassium level  . Iodinated Diagnostic Agents Nausea And Vomiting and Other (See Comments)    Hives Hives Hives Hives  . Ketorolac Tromethamine Hives  . Haldol [Haloperidol Lactate] Other (See Comments)    "jittery"   . Ketorolac Hives and Other (See Comments)    Hives, slightly labored breathing  Hives, slightly labored breathing  . Reglan [Metoclopramide]     Face "draws" and gets figgety    Social History   Socioeconomic History  . Marital status: Married    Spouse name: Not on file  . Number of children: Not on file  . Years of education: Not on file  . Highest education level: Not on file  Occupational History  .  Not on file  Social Needs  . Financial resource strain: Not on file  . Food insecurity:    Worry: Not on file    Inability: Not on file  . Transportation needs:    Medical: Not on file    Non-medical: Not on file  Tobacco Use  . Smoking status: Current Every Day Smoker    Packs/day: 0.50    Types: Cigarettes  . Smokeless tobacco: Never Used  Substance and Sexual Activity  . Alcohol use: Yes     Comment: rarely, pt stated that he drinks once ever 3 months and it is usually a beer at dinner  . Drug use: No  . Sexual activity: Yes    Partners: Female  Lifestyle  . Physical activity:    Days per week: Not on file    Minutes per session: Not on file  . Stress: Not on file  Relationships  . Social connections:    Talks on phone: Not on file    Gets together: Not on file    Attends religious service: Not on file    Active member of club or organization: Not on file    Attends meetings of clubs or organizations: Not on file    Relationship status: Not on file  Other Topics Concern  . Not on file  Social History Narrative  . Not on file    Vitals:   07/29/18 0815  BP: (!) 160/100  Pulse: 77  Resp: 12  Temp: 98.5 F (36.9 C)  SpO2: 98%   Body mass index is 28.79 kg/m.   Physical Exam  Nursing note and vitals reviewed. Constitutional: He is oriented to person, place, and time. He appears well-developed. No distress.  HENT:  Head: Normocephalic and atraumatic.  Mouth/Throat: Oropharynx is clear and moist and mucous membranes are normal.  Eyes: Pupils are equal, round, and reactive to light. Conjunctivae are normal.  Cardiovascular: Normal rate and regular rhythm.  No murmur heard. Respiratory: Effort normal and breath sounds normal. No respiratory distress.  GI: Soft. He exhibits no mass. There is no hepatomegaly. There is generalized abdominal tenderness. There is no rigidity, no rebound and no guarding.  Musculoskeletal:        General: No edema.  Lymphadenopathy:    He has no cervical adenopathy.  Neurological: He is alert and oriented to person, place, and time. He has normal strength. No cranial nerve deficit. Gait normal.  Skin: Skin is warm. No rash noted. No erythema.  Psychiatric: He has a normal mood and affect. Cognition and memory are normal.  Well groomed, good eye contact.    ASSESSMENT AND PLAN:   [name] is here today for HTN  follow up.  Diagnoses and all orders for this visit:  Hypertension, essential, benign Poorly controlled. We discussed possible complications from elevated BP. No changes in metoprolol succinate 50 mg twice daily. Lotrel 5-10 mg added today today once daily. We discussed some side effects of medications. BMP in 7 to 10 days. Instructed about warning signs. Keep appointment in 08/2018.  Headache, unspecified headache type Neurologic exam today does not suggest a serious process. Chronic opioid use could also cause headache. I do not think head imagine is needed today.  Instructed about warning signs.   Return for Keep appt in 08/2018.    Aadit Hagood G. Martinique, MD  [facility]. [place] office.

## 2018-07-29 NOTE — Assessment & Plan Note (Signed)
Poorly controlled. We discussed possible complications from elevated BP. No changes in metoprolol succinate 50 mg twice daily. Lotrel 5-10 mg added today today once daily. We discussed some side effects of medications. BMP in 7 to 10 days. Instructed about warning signs. Keep appointment in 08/2018.

## 2018-07-30 ENCOUNTER — Other Ambulatory Visit: Payer: Self-pay | Admitting: Family Medicine

## 2018-07-30 DIAGNOSIS — R109 Unspecified abdominal pain: Secondary | ICD-10-CM

## 2018-07-30 DIAGNOSIS — K861 Other chronic pancreatitis: Secondary | ICD-10-CM

## 2018-07-30 DIAGNOSIS — G8929 Other chronic pain: Secondary | ICD-10-CM

## 2018-07-30 MED ORDER — OXYCODONE HCL 15 MG PO TABS: 15.0000 mg | ORAL_TABLET | Freq: Two times a day (BID) | ORAL | 0 refills | Status: DC | PRN

## 2018-08-08 ENCOUNTER — Other Ambulatory Visit: Payer: BLUE CROSS/BLUE SHIELD

## 2018-08-20 ENCOUNTER — Other Ambulatory Visit: Payer: Self-pay | Admitting: Family Medicine

## 2018-08-20 DIAGNOSIS — I10 Essential (primary) hypertension: Secondary | ICD-10-CM

## 2018-08-25 ENCOUNTER — Other Ambulatory Visit: Payer: Self-pay | Admitting: Family Medicine

## 2018-08-25 DIAGNOSIS — G8929 Other chronic pain: Secondary | ICD-10-CM

## 2018-08-25 DIAGNOSIS — K861 Other chronic pancreatitis: Secondary | ICD-10-CM

## 2018-08-25 DIAGNOSIS — R109 Unspecified abdominal pain: Secondary | ICD-10-CM

## 2018-08-26 MED ORDER — OXYCODONE HCL 15 MG PO TABS: 15.0000 mg | ORAL_TABLET | Freq: Two times a day (BID) | ORAL | 0 refills | Status: DC | PRN

## 2018-08-29 ENCOUNTER — Ambulatory Visit (INDEPENDENT_AMBULATORY_CARE_PROVIDER_SITE_OTHER): Payer: BLUE CROSS/BLUE SHIELD | Admitting: Family Medicine

## 2018-08-29 ENCOUNTER — Other Ambulatory Visit: Payer: Self-pay

## 2018-08-29 ENCOUNTER — Encounter: Payer: Self-pay | Admitting: Family Medicine

## 2018-08-29 DIAGNOSIS — I10 Essential (primary) hypertension: Secondary | ICD-10-CM

## 2018-08-29 DIAGNOSIS — K861 Other chronic pancreatitis: Secondary | ICD-10-CM

## 2018-08-29 DIAGNOSIS — R109 Unspecified abdominal pain: Secondary | ICD-10-CM | POA: Diagnosis not present

## 2018-08-29 DIAGNOSIS — G8929 Other chronic pain: Secondary | ICD-10-CM

## 2018-08-29 DIAGNOSIS — G894 Chronic pain syndrome: Secondary | ICD-10-CM | POA: Diagnosis not present

## 2018-08-29 DIAGNOSIS — M549 Dorsalgia, unspecified: Secondary | ICD-10-CM | POA: Diagnosis not present

## 2018-08-29 MED ORDER — OXYCODONE HCL 15 MG PO TABS: 15.0000 mg | ORAL_TABLET | Freq: Two times a day (BID) | ORAL | 0 refills | Status: DC | PRN

## 2018-08-29 MED ORDER — AMLODIPINE BESY-BENAZEPRIL HCL 5-20 MG PO CAPS: 1.0000 | ORAL_CAPSULE | Freq: Every day | ORAL | 3 refills | Status: DC

## 2018-08-29 NOTE — Addendum Note (Signed)
Addended by: Martinique, BETTY G on: 08/29/2018 10:04 PM   Modules accepted: Orders

## 2018-08-29 NOTE — Assessment & Plan Note (Addendum)
Stable. ? Radicular. Thoracic and Lumbar MRI will be arranged. No changes in current management,side effects of Oxycodone reviewed.

## 2018-08-29 NOTE — Assessment & Plan Note (Signed)
Still BP running mildly elevated. Goal 130/80 or less. No changes in Metoprolol Succinate. Lotrel increased from 5-10 mg to 5-20 mg. Continue monitoring BP regularly. Low salt diet and smoking cessation are also recommended. F/U in 3 months.

## 2018-08-29 NOTE — Assessment & Plan Note (Signed)
Problem has ben stable for years. Problem could be contributing to upper abdominal pain. Lumbar a thoracic MRI ordered. We could consider Cymbalta for pain management.

## 2018-08-29 NOTE — Assessment & Plan Note (Signed)
We discussed guidelines for chronic opioid use for pain management. We were planning on signing med contract today but due to recent COVID-19 pandemic and stay home recommendations, we needs to postpone until next OV. [place] controlled subs report reviewed. Rx's x 2 sent, pre dated. F/U in 3 months.

## 2018-08-29 NOTE — Progress Notes (Signed)
Virtual Visit via Video Note  I connected with [name] on 08/29/18 at  7:30 AM EDT by a video enabled telemedicine application and verified that I am speaking with the correct person using two identifiers.  Location patient: home Location provider:home office Persons participating in the virtual visit: patient, provider  I discussed the limitations of evaluation and management by telemedicine and the availability of in person appointments. The patient expressed understanding and agreed to proceed.   HPI: [name] is a 43 yo ,who is being seen via [facility] for chronic pain management and HTN. Last F/U visit on 07/29/18. No new problem or ER visit since his last OV.  He has chronic upper abdominal pain,thought to be caused by chronic pancreatitis. He has followed with GI and according to pt,he was told that pain did not seem related to GI etiology, could be coming from his back.  Currently he is on Oxycodone 15 mg bid, which he is tolerating well. Intermittent nausea. Problem is stable. Oxycodone still helping.   Low mid back pain intermittent,radiated to hips. He points of low thoracic and upper lumbar. Problem is chronic. It seems to be stable.  Pain is radiated to hips, achy like, usually 4-5/10 but sometimes 8/10 in intensity, with no associated LE numbness, tingling, urinary incontinence or retention, stool incontinence, or saddle anesthesia.  Exacerbated by prolonged standing/walking. Alleviated by rest. No rash or edema on area, fever, chills, or abnormal wt loss.  He has had back pain since late teens,had MRI done 20 years ago.    Hypertension:  Currently on Metoprolol Succinate 100 mg 1/2 tab bid and Lotrel 5-10 mg. BP's 130-140's/80's. He is taking medications as instructed, no side effects reported. He has not noted unusual headache, visual changes, exertional chest pain, dyspnea,  focal weakness, or edema.   Lab Results  Component Value Date   CREATININE 0.91  04/28/2018   BUN 15 04/28/2018   NA 139 04/28/2018   K 3.8 04/28/2018   CL 108 04/28/2018   CO2 23 04/28/2018   ROS: See pertinent positives and negatives per HPI. COVID-19 screening questions: Denies new fever,cough,sore throat,or possible exposure to COVID-19. Denies changes in small or taste.  Past Medical History:  Diagnosis Date  . Chronic abdominal pain   . Diverticulitis   . Drug-seeking behavior   . Hypertension   . Kidney stone   . Kidney stones   . Pancreatitis, chronic ([place]) 05/28/2005    Past Surgical History:  Procedure Laterality Date  . APPENDECTOMY    . CHOLECYSTECTOMY    . ERCP W/ METAL STENT PLACEMENT    . KIDNEY STONE SURGERY      Family History  Problem Relation Age of Onset  . Cancer Mother   . Early death Mother   . Hypertension Mother   . Heart failure Father   . Hypertension Father   . Heart disease Father   . Hyperlipidemia Father   . Pancreatic cancer Paternal Grandmother        possibly  . Pancreatic cancer Paternal Aunt   . Colon cancer Neg Hx     Social History   Socioeconomic History  . Marital status: Married    Spouse name: Not on file  . Number of children: Not on file  . Years of education: Not on file  . Highest education level: Not on file  Occupational History  . Not on file  Social Needs  . Financial resource strain: Not on file  . Food  insecurity:    Worry: Not on file    Inability: Not on file  . Transportation needs:    Medical: Not on file    Non-medical: Not on file  Tobacco Use  . Smoking status: Current Every Day Smoker    Packs/day: 0.50    Types: Cigarettes  . Smokeless tobacco: Never Used  Substance and Sexual Activity  . Alcohol use: Yes    Comment: rarely, pt stated that he drinks once ever 3 months and it is usually a beer at dinner  . Drug use: No  . Sexual activity: Yes    Partners: Female  Lifestyle  . Physical activity:    Days per week: Not on file    Minutes per session: Not on file   . Stress: Not on file  Relationships  . Social connections:    Talks on phone: Not on file    Gets together: Not on file    Attends religious service: Not on file    Active member of club or organization: Not on file    Attends meetings of clubs or organizations: Not on file    Relationship status: Not on file  . Intimate partner violence:    Fear of current or ex partner: Not on file    Emotionally abused: Not on file    Physically abused: Not on file    Forced sexual activity: Not on file  Other Topics Concern  . Not on file  Social History Narrative  . Not on file     Current Outpatient Medications:  .  amLODipine-benazepril (LOTREL) 5-20 MG capsule, Take 1 capsule by mouth daily., Disp: 30 capsule, Rfl: 3 .  metoprolol succinate (TOPROL-XL) 100 MG 24 hr tablet, Take 1 tablet (100 mg total) by mouth daily. Take with or immediately following a meal., Disp: 90 tablet, Rfl: 0 .  ondansetron (ZOFRAN) 4 MG tablet, Take 1 tablet (4 mg total) by mouth every 8 (eight) hours as needed., Disp: 6 tablet, Rfl: 0 .  oxyCODONE (ROXICODONE) 15 MG immediate release tablet, Take 1 tablet (15 mg total) by mouth 2 (two) times daily as needed for pain., Disp: 60 tablet, Rfl: 0 .  promethazine (PHENERGAN) 25 MG tablet, Take 1 tablet (25 mg total) by mouth every 12 (twelve) hours as needed., Disp: 45 tablet, Rfl: 3  EXAM:  VITALS per patient if applicable:BP 638/46   Pulse 84   Resp 12 Comment: approximately  GENERAL: alert, oriented, appears well and in no acute distress  HEENT: atraumatic, conjunctiva clear, no obvious abnormalities on inspection of face.  NECK: normal movements of the head and neck  LUNGS: on inspection no signs of respiratory distress, breathing rate appears normal, no obvious gross SOB, gasping or wheezing  CV: no obvious cyanosis  MS: moves all visible extremities without noticeable abnormality  PSYCH/NEURO: pleasant and cooperative, no obvious depression or  anxiety, speech and thought processing grossly intact  ASSESSMENT AND PLAN:  Discussed the following assessment and plan:  Orders Placed This Encounter  Procedures  . Leonard Lumbar Spine Wo Contrast  . Leonard Thoracic Spine Wo Contrast     Hypertension, essential, benign Still BP running mildly elevated. Goal 130/80 or less. No changes in Metoprolol Succinate. Lotrel increased from 5-10 mg to 5-20 mg. Continue monitoring BP regularly. Low salt diet and smoking cessation are also recommended. F/U in 3 months.  Chronic abdominal pain Stable. ? Radicular. Thoracic and Lumbar MRI will be arranged. No changes in current management,side  effects of Oxycodone reviewed.   Back pain, chronic Problem has ben stable for years. Problem could be contributing to upper abdominal pain. Lumbar a thoracic MRI ordered. We could consider Cymbalta for pain management.  Chronic pain disorder We discussed guidelines for chronic opioid use for pain management. We were planning on signing med contract today but due to recent COVID-19 pandemic and stay home recommendations, we needs to postpone until next OV.  controlled subs report reviewed. Rx's x 2 sent, pre dated. F/U in 3 months.    I discussed the assessment and treatment plan with the patient. The patient was provided an opportunity to ask questions and all were answered. The patient agreed with the plan and demonstrated an understanding of the instructions.   Return in about 3 months (around 11/28/2018) for pain and HTN.     [place], MD

## 2018-09-03 ENCOUNTER — Telehealth: Payer: Self-pay | Admitting: *Deleted

## 2018-09-03 NOTE — Telephone Encounter (Signed)
Oxycodone  Note from pharmacy: 2 Rx's for [name] came over on 08/29/2018, however the Rx for 09/29/2018 was deleted, can you please resend

## 2018-09-04 ENCOUNTER — Other Ambulatory Visit: Payer: Self-pay | Admitting: Family Medicine

## 2018-09-04 NOTE — Telephone Encounter (Signed)
He filled Rx for Oxycodone on 08/30/18. At the pharmacy it is supposed to be 2 more for 09/29/18 and 10/30/18.  Leonard Yontz Martinique, MD

## 2018-09-11 ENCOUNTER — Encounter: Payer: Self-pay | Admitting: Family Medicine

## 2018-09-11 ENCOUNTER — Other Ambulatory Visit: Payer: BLUE CROSS/BLUE SHIELD

## 2018-09-12 ENCOUNTER — Other Ambulatory Visit: Payer: BLUE CROSS/BLUE SHIELD

## 2018-09-12 ENCOUNTER — Other Ambulatory Visit: Payer: Self-pay | Admitting: Family Medicine

## 2018-09-12 DIAGNOSIS — I10 Essential (primary) hypertension: Secondary | ICD-10-CM

## 2018-09-24 ENCOUNTER — Other Ambulatory Visit: Payer: Self-pay | Admitting: Family Medicine

## 2018-09-24 DIAGNOSIS — R112 Nausea with vomiting, unspecified: Secondary | ICD-10-CM

## 2018-09-24 DIAGNOSIS — K861 Other chronic pancreatitis: Secondary | ICD-10-CM

## 2018-09-26 ENCOUNTER — Other Ambulatory Visit: Payer: Self-pay | Admitting: Family Medicine

## 2018-09-28 ENCOUNTER — Other Ambulatory Visit: Payer: Self-pay | Admitting: Family Medicine

## 2018-09-28 DIAGNOSIS — R109 Unspecified abdominal pain: Secondary | ICD-10-CM

## 2018-09-28 DIAGNOSIS — K861 Other chronic pancreatitis: Secondary | ICD-10-CM

## 2018-09-28 DIAGNOSIS — G8929 Other chronic pain: Secondary | ICD-10-CM

## 2018-09-29 ENCOUNTER — Encounter: Payer: Self-pay | Admitting: Family Medicine

## 2018-09-29 ENCOUNTER — Other Ambulatory Visit: Payer: Self-pay

## 2018-09-29 ENCOUNTER — Encounter: Payer: BLUE CROSS/BLUE SHIELD | Admitting: Family Medicine

## 2018-09-29 ENCOUNTER — Ambulatory Visit (INDEPENDENT_AMBULATORY_CARE_PROVIDER_SITE_OTHER): Payer: BLUE CROSS/BLUE SHIELD | Admitting: Family Medicine

## 2018-09-29 ENCOUNTER — Telehealth: Payer: Self-pay | Admitting: *Deleted

## 2018-09-29 VITALS — BP 120/70 | HR 90 | Resp 12

## 2018-09-29 DIAGNOSIS — G8929 Other chronic pain: Secondary | ICD-10-CM

## 2018-09-29 DIAGNOSIS — M549 Dorsalgia, unspecified: Secondary | ICD-10-CM | POA: Diagnosis not present

## 2018-09-29 DIAGNOSIS — R109 Unspecified abdominal pain: Secondary | ICD-10-CM | POA: Diagnosis not present

## 2018-09-29 DIAGNOSIS — K861 Other chronic pancreatitis: Secondary | ICD-10-CM

## 2018-09-29 MED ORDER — OXYCODONE HCL 15 MG PO TABS: 15.0000 mg | ORAL_TABLET | Freq: Two times a day (BID) | ORAL | 0 refills | Status: DC | PRN

## 2018-09-29 MED ORDER — GABAPENTIN 100 MG PO CAPS: 100.0000 mg | ORAL_CAPSULE | Freq: Every day | ORAL | 0 refills | Status: DC

## 2018-09-29 NOTE — Assessment & Plan Note (Addendum)
Gradually getting worse. Recommend avoiding trigger factors. Referral to ortho placed. Instructed about warning signs.

## 2018-09-29 NOTE — Telephone Encounter (Signed)
Copied from [place] [id number]. Topic: General - Other >> Sep 29, 2018  7:04 AM Carolyn Stare wrote:  Pt req appt for back pain

## 2018-09-29 NOTE — Assessment & Plan Note (Signed)
?   Radicular pain. After discussion of some side effects he would like to try Gabapentin,he will start 100 mg at bedtime and titrate up to 300 mg at bedtime as tolerated. Instructed about warning signs. No changes in Oxycodone dose. F/U in 4 weeks.

## 2018-09-29 NOTE — Progress Notes (Signed)
Virtual Visit via Video Note   I connected with [name] on 09/29/18 at  9:30 AM EDT by a video enabled telemedicine application and verified that I am speaking with the correct person using two identifiers.  Location patient: home Location provider:home office Persons participating in the virtual visit: patient, provider  I discussed the limitations of evaluation and management by telemedicine and the availability of in person appointments. He expressed understanding and agreed to proceed.   HPI: [name] is a 43 yo male with Hx of chronic pain,HTN,and chronic pancreatitis c/o worsening low back pain. He was last seen on 08/29/18. Mid low back pain,sometiems radiated to hips. He has Hx of chronic upper abdominal pain and according to pt,thought not to be GI related but rather radicular pain.  Lumbar and thoracic MRI was not approved by his health insurance.  Back pain mildly worse after doing yard work 4 days ago. He has tried Cymbalta in the past, it did not help. Pain is intermittent ,achy like,8/10. It is exacerbated by prolonged walking,standing,and with certain movements. Alleviated by position changes and rest. Denies LE numbness or tingling. Negative for saddle anesthesia or bowel/urine incontinence.  He is on Oxycodone for chronic abdominal pain.  Denies associated fever,chils,changes in bowel habits,blood in stool,or urinary symptoms.  ROS: See pertinent positives and negatives per HPI.  Past Medical History:  Diagnosis Date  . Chronic abdominal pain   . Diverticulitis   . Drug-seeking behavior   . Hypertension   . Kidney stone   . Kidney stones   . Pancreatitis, chronic ([place]) 05/28/2005    Past Surgical History:  Procedure Laterality Date  . APPENDECTOMY    . CHOLECYSTECTOMY    . ERCP W/ METAL STENT PLACEMENT    . KIDNEY STONE SURGERY      Family History  Problem Relation Age of Onset  . Cancer Mother   . Early death Mother   . Hypertension Mother   .  Heart failure Father   . Hypertension Father   . Heart disease Father   . Hyperlipidemia Father   . Pancreatic cancer Paternal Grandmother        possibly  . Pancreatic cancer Paternal Aunt   . Colon cancer Neg Hx     Social History   Socioeconomic History  . Marital status: Married    Spouse name: Not on file  . Number of children: Not on file  . Years of education: Not on file  . Highest education level: Not on file  Occupational History  . Not on file  Social Needs  . Financial resource strain: Not on file  . Food insecurity:    Worry: Not on file    Inability: Not on file  . Transportation needs:    Medical: Not on file    Non-medical: Not on file  Tobacco Use  . Smoking status: Current Every Day Smoker    Packs/day: 0.50    Types: Cigarettes  . Smokeless tobacco: Never Used  Substance and Sexual Activity  . Alcohol use: Yes    Comment: rarely, pt stated that he drinks once ever 3 months and it is usually a beer at dinner  . Drug use: No  . Sexual activity: Yes    Partners: Female  Lifestyle  . Physical activity:    Days per week: Not on file    Minutes per session: Not on file  . Stress: Not on file  Relationships  . Social connections:  Talks on phone: Not on file    Gets together: Not on file    Attends religious service: Not on file    Active member of club or organization: Not on file    Attends meetings of clubs or organizations: Not on file    Relationship status: Not on file  . Intimate partner violence:    Fear of current or ex partner: Not on file    Emotionally abused: Not on file    Physically abused: Not on file    Forced sexual activity: Not on file  Other Topics Concern  . Not on file  Social History Narrative  . Not on file      Current Outpatient Medications:  .  amLODipine-benazepril (LOTREL) 5-20 MG capsule, TAKE 1 CAPSULE BY MOUTH EVERY DAY, Disp: 90 capsule, Rfl: 1 .  gabapentin (NEURONTIN) 100 MG capsule, Take 1 capsule  (100 mg total) by mouth at bedtime., Disp: 90 capsule, Rfl: 0 .  metoprolol succinate (TOPROL-XL) 100 MG 24 hr tablet, TAKE 1 TABLET (100 MG TOTAL) BY MOUTH DAILY. TAKE WITH OR IMMEDIATELY FOLLOWING A MEAL., Disp: 90 tablet, Rfl: 0 .  ondansetron (ZOFRAN) 4 MG tablet, Take 1 tablet (4 mg total) by mouth every 8 (eight) hours as needed., Disp: 6 tablet, Rfl: 0 .  oxyCODONE (ROXICODONE) 15 MG immediate release tablet, Take 1 tablet (15 mg total) by mouth 2 (two) times daily as needed for pain., Disp: 60 tablet, Rfl: 0 .  promethazine (PHENERGAN) 25 MG tablet, TAKE 1 TABLET (25 MG TOTAL) BY MOUTH EVERY 12 (TWELVE) HOURS AS NEEDED., Disp: 45 tablet, Rfl: 1  EXAM:  VITALS per patient if applicable:  GENERAL: alert, oriented, appears well and in no acute distress  HEENT: atraumatic, conjunctiva clear, normocephalic weight no obvious facial abnormalities on inspection.  LUNGS: on inspection no signs of respiratory distress, breathing rate appears normal, no obvious gross SOB, gasping or wheezing  CV: no obvious cyanosis.  PSYCH/NEURO: pleasant and cooperative, no obvious depression or anxiety, speech and thought processing grossly intact  ASSESSMENT AND PLAN:  Discussed the following assessment and plan:  Back pain, chronic Gradually getting worse. Recommend avoiding trigger factors. Referral to ortho placed. Instructed about warning signs.  Chronic abdominal pain ? Radicular pain. After discussion of some side effects he would like to try Gabapentin,he will start 100 mg at bedtime and titrate up to 300 mg at bedtime as tolerated. Instructed about warning signs. No changes in Oxycodone dose. F/U in 4 weeks.    He needs refills for Oxycodone. Chronic biliary pancreatitis ([place]) - Plan: oxyCODONE (ROXICODONE) 15 MG immediate release tablet   I discussed the assessment and treatment plan with the patient. He was provided an opportunity to ask questions and all were answered. The  patient agreed with the plan and demonstrated an understanding of the instructions.   The patient was advised to call back or seek an in-person evaluation if the symptoms worsen or if the condition fails to improve as anticipated.   Return in about 4 weeks (around 10/27/2018) for back pain.    Betty Martinique, MD

## 2018-10-17 ENCOUNTER — Encounter (HOSPITAL_BASED_OUTPATIENT_CLINIC_OR_DEPARTMENT_OTHER): Payer: Self-pay | Admitting: Emergency Medicine

## 2018-10-17 ENCOUNTER — Emergency Department (HOSPITAL_BASED_OUTPATIENT_CLINIC_OR_DEPARTMENT_OTHER)
Admission: EM | Admit: 2018-10-17 | Discharge: 2018-10-17 | Disposition: A | Discharge status: Home / Self Care | Payer: BLUE CROSS/BLUE SHIELD | Attending: Emergency Medicine | Admitting: Emergency Medicine

## 2018-10-17 ENCOUNTER — Other Ambulatory Visit: Payer: Self-pay

## 2018-10-17 DIAGNOSIS — Z79899 Other long term (current) drug therapy: Secondary | ICD-10-CM | POA: Diagnosis not present

## 2018-10-17 DIAGNOSIS — I10 Essential (primary) hypertension: Secondary | ICD-10-CM | POA: Insufficient documentation

## 2018-10-17 DIAGNOSIS — F17210 Nicotine dependence, cigarettes, uncomplicated: Secondary | ICD-10-CM | POA: Insufficient documentation

## 2018-10-17 DIAGNOSIS — R112 Nausea with vomiting, unspecified: Secondary | ICD-10-CM | POA: Diagnosis not present

## 2018-10-17 DIAGNOSIS — R109 Unspecified abdominal pain: Secondary | ICD-10-CM | POA: Diagnosis not present

## 2018-10-17 DIAGNOSIS — G8929 Other chronic pain: Secondary | ICD-10-CM | POA: Diagnosis not present

## 2018-10-17 LAB — COMPREHENSIVE METABOLIC PANEL
ALT: 31 U/L (ref 0–44)
AST: 20 U/L (ref 15–41)
Albumin: 4.2 g/dL (ref 3.5–5.0)
Alkaline Phosphatase: 52 U/L (ref 38–126)
Anion gap: 8 (ref 5–15)
BUN: 16 mg/dL (ref 6–20)
CO2: 23 mmol/L (ref 22–32)
Calcium: 9 mg/dL (ref 8.9–10.3)
Chloride: 110 mmol/L (ref 98–111)
Creatinine, Ser: 0.85 mg/dL (ref 0.61–1.24)
GFR calc Af Amer: >60 mL/min (ref >60)
GFR calc non Af Amer: >60 mL/min (ref >60)
Glucose, Bld: 96 mg/dL (ref 70–99)
Potassium: 3.5 mmol/L (ref 3.5–5.1)
Sodium: 141 mmol/L (ref 135–145)
Total Bilirubin: 0.4 mg/dL (ref 0.3–1.2)
Total Protein: 7 g/dL (ref 6.5–8.1)

## 2018-10-17 LAB — URINALYSIS, ROUTINE W REFLEX MICROSCOPIC
Bilirubin Urine: NEGATIVE
Glucose, UA: NEGATIVE mg/dL
Hgb urine dipstick: NEGATIVE
Ketones, ur: NEGATIVE mg/dL
Leukocytes,Ua: NEGATIVE
Nitrite: NEGATIVE
Protein, ur: NEGATIVE mg/dL
Specific Gravity, Urine: >1.03 — ABNORMAL HIGH (ref 1.005–1.030)
pH: 5.5 (ref 5.0–8.0)

## 2018-10-17 LAB — CBC WITH DIFFERENTIAL/PLATELET
Abs Immature Granulocytes: 0.08 10*3/uL — ABNORMAL HIGH (ref 0.00–0.07)
Basophils Absolute: 0.1 10*3/uL (ref 0.0–0.1)
Basophils Relative: 1 %
Eosinophils Absolute: 0.3 10*3/uL (ref 0.0–0.5)
Eosinophils Relative: 2 %
HCT: 44.9 % (ref 39.0–52.0)
Hemoglobin: 15.5 g/dL (ref 13.0–17.0)
Immature Granulocytes: 1 %
Lymphocytes Relative: 30 %
Lymphs Abs: 3.5 10*3/uL (ref 0.7–4.0)
MCH: 30.3 pg (ref 26.0–34.0)
MCHC: 34.5 g/dL (ref 30.0–36.0)
MCV: 87.7 fL (ref 80.0–100.0)
Monocytes Absolute: 1.1 10*3/uL — ABNORMAL HIGH (ref 0.1–1.0)
Monocytes Relative: 9 %
Neutro Abs: 6.8 10*3/uL (ref 1.7–7.7)
Neutrophils Relative %: 57 %
Platelets: 267 10*3/uL (ref 150–400)
RBC: 5.12 MIL/uL (ref 4.22–5.81)
RDW: 12.4 % (ref 11.5–15.5)
WBC: 11.8 10*3/uL — ABNORMAL HIGH (ref 4.0–10.5)
nRBC: 0 % (ref 0.0–0.2)

## 2018-10-17 LAB — LIPASE, BLOOD: Lipase: 39 U/L (ref 11–51)

## 2018-10-17 MED ORDER — SODIUM CHLORIDE 0.9 % IV BOLUS
1000.0000 mL | Freq: Once | INTRAVENOUS | Status: AC
  Administered 2018-10-17: 04:00:00 1000 mL via INTRAVENOUS

## 2018-10-17 MED ORDER — PROMETHAZINE HCL 25 MG RE SUPP: 25.0000 mg | Freq: Four times a day (QID) | RECTAL | 0 refills | Status: DC | PRN

## 2018-10-17 MED ORDER — HYDROMORPHONE HCL 1 MG/ML IJ SOLN
1.0000 mg | Freq: Once | INTRAMUSCULAR | Status: AC
  Administered 2018-10-17: 1 mg via INTRAVENOUS
  Filled 2018-10-17: qty 1

## 2018-10-17 MED ORDER — ONDANSETRON HCL 4 MG/2ML IJ SOLN
4.0000 mg | Freq: Once | INTRAMUSCULAR | Status: AC
  Administered 2018-10-17: 4 mg via INTRAVENOUS
  Filled 2018-10-17: qty 2

## 2018-10-17 NOTE — ED Provider Notes (Signed)
[facility] EMERGENCY DEPARTMENT Provider Note   CSN: [id number] Arrival date & time: 10/17/18  0327    History   Chief Complaint No chief complaint on file.   HPI [name] is a 43 y.o. male.   The history is provided by the patient.  He has history of hypertension, chronic abdominal pain, chronic pancreatitis, diverticulitis and comes in complaining of left upper quadrant pain with radiation to the back with associated nausea and vomiting.  Symptoms started this evening.  He is vomited multiple times.  He tried taking oral promethazine and oxycodone, but vomited after taking them.  He rates pain at 7/10.  Nothing makes it better, nothing makes it worse.  He denies fever, chills, sweats.  He denies constipation or diarrhea.  He denies hematemesis.  He denies alcohol consumption.  Past Medical History:  Diagnosis Date  . Chronic abdominal pain   . Diverticulitis   . Drug-seeking behavior   . Hypertension   . Kidney stone   . Kidney stones   . Pancreatitis, chronic ([place]) 05/28/2005    Patient Active Problem List   Diagnosis Date Noted  . Chronic pain disorder 08/29/2018  . Back pain, chronic 08/29/2018  . Nausea and vomiting in adult 02/25/2018  . Hypertension, essential, benign 01/18/2018  . Chronic biliary pancreatitis ([place]) 01/18/2018  . Chronic abdominal pain 01/14/2018    Past Surgical History:  Procedure Laterality Date  . APPENDECTOMY    . CHOLECYSTECTOMY    . ERCP W/ METAL STENT PLACEMENT    . KIDNEY STONE SURGERY          Home Medications    Prior to Admission medications   Medication Sig Start Date End Date Taking? Authorizing Provider  amLODipine-benazepril (LOTREL) 5-20 MG capsule TAKE 1 CAPSULE BY MOUTH EVERY DAY 09/26/18   Martinique, Betty G, MD  gabapentin (NEURONTIN) 100 MG capsule Take 1 capsule (100 mg total) by mouth at bedtime. 09/29/18   Martinique, Betty G, MD  metoprolol succinate (TOPROL-XL) 100 MG 24 hr tablet TAKE 1 TABLET (100  MG TOTAL) BY MOUTH DAILY. TAKE WITH OR IMMEDIATELY FOLLOWING A MEAL. 09/12/18   Martinique, Betty G, MD  ondansetron (ZOFRAN) 4 MG tablet Take 1 tablet (4 mg total) by mouth every 8 (eight) hours as needed. 11/05/17   Rolland Porter, MD  oxyCODONE (ROXICODONE) 15 MG immediate release tablet Take 1 tablet (15 mg total) by mouth 2 (two) times daily as needed for pain. 09/29/18   Martinique, Betty G, MD  promethazine (PHENERGAN) 25 MG tablet TAKE 1 TABLET (25 MG TOTAL) BY MOUTH EVERY 12 (TWELVE) HOURS AS NEEDED. 09/24/18   Martinique, Betty G, MD    Family History Family History  Problem Relation Age of Onset  . Cancer Mother   . Early death Mother   . Hypertension Mother   . Heart failure Father   . Hypertension Father   . Heart disease Father   . Hyperlipidemia Father   . Pancreatic cancer Paternal Grandmother        possibly  . Pancreatic cancer Paternal Aunt   . Colon cancer Neg Hx     Social History Social History   Tobacco Use  . Smoking status: Current Every Day Smoker    Packs/day: 0.50    Types: Cigarettes  . Smokeless tobacco: Never Used  Substance Use Topics  . Alcohol use: Yes    Comment: rarely, pt stated that he drinks once ever 3 months and it is usually a  beer at dinner  . Drug use: No     Allergies   Cortisone; Eggs or egg-derived products; Ketorolac tromethamine; Haloperidol; Hydrocortisone; Iodinated diagnostic agents; Ketorolac tromethamine; Haldol [haloperidol lactate]; Ketorolac; and Reglan [metoclopramide]   Review of Systems Review of Systems  All other systems reviewed and are negative.    Physical Exam Updated Vital Signs BP (!) 146/106 (BP Location: Left Arm)   Pulse 98   Temp 98.3 F (36.8 C) (Oral)   Resp 16   SpO2 98%   Physical Exam Vitals signs and nursing note reviewed.    43 year old male, resting comfortably and in no acute distress. Vital signs are significant for elevated blood pressure. Oxygen saturation is 98%, which is normal. Head is  normocephalic and atraumatic. PERRLA, EOMI. Oropharynx is clear. Neck is nontender and supple without adenopathy or JVD. Back is nontender and there is no CVA tenderness. Lungs are clear without rales, wheezes, or rhonchi. Chest is nontender. Heart has regular rate and rhythm without murmur. Abdomen is soft, flat, with moderate epigastric and left upper quadrant tenderness.  There is no rebound or guarding.  There are no masses or hepatosplenomegaly and peristalsis is hypoactive. Extremities have no cyanosis or edema, full range of motion is present. Skin is warm and dry without rash. Neurologic: Mental status is normal, cranial nerves are intact, there are no motor or sensory deficits.  ED Treatments / Results  Labs (all labs ordered are listed, but only abnormal results are displayed) Labs Reviewed  CBC WITH DIFFERENTIAL/PLATELET - Abnormal; Notable for the following components:      Result Value   WBC 11.8 (*)    Monocytes Absolute 1.1 (*)    Abs Immature Granulocytes 0.08 (*)    All other components within normal limits  URINALYSIS, ROUTINE W REFLEX MICROSCOPIC - Abnormal; Notable for the following components:   Specific Gravity, Urine >1.030 (*)    All other components within normal limits  COMPREHENSIVE METABOLIC PANEL  LIPASE, BLOOD   Procedures Procedures   Medications Ordered in ED Medications  sodium chloride 0.9 % bolus 1,000 mL ( Intravenous Stopped 10/17/18 0456)  ondansetron (ZOFRAN) injection 4 mg (4 mg Intravenous Given 10/17/18 0403)  HYDROmorphone (DILAUDID) injection 1 mg (1 mg Intravenous Given 10/17/18 0405)     Initial Impression / Assessment and Plan / ED Course  I have reviewed the triage vital signs and the nursing notes.  Pertinent labs & imaging results that were available during my care of the patient were reviewed by me and considered in my medical decision making (see chart for details).  Nausea and vomiting and abdominal pain in patient with  history of chronic abdominal pain.  Old records are reviewed, and he has multiple ED visits for abdominal pain.  He also has 26 CT scans on record.  Curiously, none of them comment on sequelae of chronic pancreatitis and then mention diverticulosis or diverticulitis.  His record on the [place] controlled substance reporting website was reviewed, and he gets monthly prescriptions for 60 oxycodone 15 mg tablets.  Will check screening labs and give IV fluids, IV ondansetron, IV hydromorphone.  No indication for imaging today.  Labs are unremarkable.  There is a mild leukocytosis but with no left shift.  Following the above-noted treatment, nausea had improved and he states pain was down to 5/10.  Since he has chronic pain, this was felt to be an acceptable result.  He is discharged with a prescription for promethazine suppositories, follow-up  with PCP.  Final Clinical Impressions(s) / ED Diagnoses   Final diagnoses:  Chronic abdominal pain  Non-intractable vomiting with nausea, unspecified vomiting type    ED Discharge Orders         Ordered    promethazine (PHENERGAN) 25 MG suppository  Every 6 hours PRN     10/17/18 3953           Delora Fuel, MD [birth year] [phone]

## 2018-10-17 NOTE — ED Triage Notes (Signed)
Patient co mid to left side abd pain with nausea and vomiting onset 1800 yesterday; states he feels like he's having a "pancreatitis attack"

## 2018-11-15 ENCOUNTER — Other Ambulatory Visit: Payer: Self-pay | Admitting: Family Medicine

## 2018-11-15 DIAGNOSIS — G8929 Other chronic pain: Secondary | ICD-10-CM

## 2018-11-15 DIAGNOSIS — K861 Other chronic pancreatitis: Secondary | ICD-10-CM

## 2018-11-15 DIAGNOSIS — R112 Nausea with vomiting, unspecified: Secondary | ICD-10-CM

## 2018-11-20 MED ORDER — OXYCODONE HCL 15 MG PO TABS: 15.0000 mg | ORAL_TABLET | Freq: Two times a day (BID) | ORAL | 0 refills | Status: DC | PRN

## 2018-12-03 ENCOUNTER — Other Ambulatory Visit: Payer: Self-pay | Admitting: Family Medicine

## 2018-12-03 DIAGNOSIS — I10 Essential (primary) hypertension: Secondary | ICD-10-CM

## 2018-12-21 ENCOUNTER — Other Ambulatory Visit: Payer: Self-pay | Admitting: Family Medicine

## 2018-12-24 ENCOUNTER — Other Ambulatory Visit: Payer: Self-pay | Admitting: Family Medicine

## 2018-12-24 DIAGNOSIS — G8929 Other chronic pain: Secondary | ICD-10-CM

## 2018-12-24 DIAGNOSIS — K861 Other chronic pancreatitis: Secondary | ICD-10-CM

## 2018-12-26 ENCOUNTER — Ambulatory Visit (INDEPENDENT_AMBULATORY_CARE_PROVIDER_SITE_OTHER): Payer: BLUE CROSS/BLUE SHIELD | Admitting: Family Medicine

## 2018-12-26 ENCOUNTER — Other Ambulatory Visit: Payer: Self-pay

## 2018-12-26 ENCOUNTER — Encounter: Payer: Self-pay | Admitting: Family Medicine

## 2018-12-26 VITALS — BP 130/82

## 2018-12-26 DIAGNOSIS — R112 Nausea with vomiting, unspecified: Secondary | ICD-10-CM

## 2018-12-26 DIAGNOSIS — R109 Unspecified abdominal pain: Secondary | ICD-10-CM | POA: Diagnosis not present

## 2018-12-26 DIAGNOSIS — M549 Dorsalgia, unspecified: Secondary | ICD-10-CM

## 2018-12-26 DIAGNOSIS — I10 Essential (primary) hypertension: Secondary | ICD-10-CM

## 2018-12-26 DIAGNOSIS — G8929 Other chronic pain: Secondary | ICD-10-CM

## 2018-12-26 NOTE — Progress Notes (Signed)
Virtual Visit via Video Note   I connected with Mr Ollinger on 12/27/18 at 10:15 AM EDT by a video enabled telemedicine application and verified that I am speaking with the correct person using two identifiers.  Location patient: home Location provider:work office Persons participating in the virtual visit: patient, provider  I discussed the limitations of evaluation and management by telemedicine and the availability of in person appointments. The patient expressed understanding and agreed to proceed.   HPI: Mr Huffaker is a 43 yo male seen today for chronic disease and pain management. 15 years hx of abdominal pain that has been attributed to chronic pancreatitis. Several ER visits with c/o abdominal pain. He has been dx with diverticulitis and ahs Hx of nephrolithiasis.  ERCP with sphincterotomy + stone removal due to choledocholithiasis and biliary stricture. Last ER visit 10/17/18 Lab Results  Component Value Date   WBC 11.8 (H) 10/17/2018   HGB 15.5 10/17/2018   HCT 44.9 10/17/2018   MCV 87.7 10/17/2018   PLT 267 10/17/2018   Lab Results  Component Value Date   ALT 31 10/17/2018   AST 20 10/17/2018   ALKPHOS 52 10/17/2018   BILITOT 0.4 10/17/2018    Epigastric and left-sided abdominal pain. He has not identified exacerbating or alleviating factors.  Also hx of low back pain,which seems to be getting worse. Back pain is interfering with sleep. It is intermittent,usually 4-5/10,no radiated. Exacerbated by some activities,yard work. Denies saddle anesthesia, LE numbness/tingling,or bowel/urine incontinence.  Lumbar/thoracic,MRI and ortho referral were placed last visit but according to pt,neither one was approved by his health insurance. He states that both services will be covered at [facility]. Thinking about possibility of abdominal pain being radicular pain,Gabapentin was recommended last visit,discontinued because he sis not noted any  improvement.  Sharp,stubbing abdominal pain,no radiated,7-8/10,constant.  He is on Oxycodone 15 mg bid, 1-2 times per week he takes tid. He is also on Phenergan 25 mg daily as needed for chronic nausea and vomiting.  He has been interested in evaluation but GI specialist in [facility], states that he has not heard about appt.  He is currently following with Dr Henrene Pastor.  HTN,on 08/29/18, Lotrel was increased from 5-10 mg to 5-20 mg daily. He is also on Metoprolol Succinate 100 mg daily. Home BP's 130's/80's. Denies severe/frequent headache, visual changes, chest pain, dyspnea, palpitation, claudication, focal weakness, or edema. Lab Results  Component Value Date   CREATININE 0.85 10/17/2018   BUN 16 10/17/2018   NA 141 10/17/2018   K 3.5 10/17/2018   CL 110 10/17/2018   CO2 23 10/17/2018     ROS: See pertinent positives and negatives per HPI.  Past Medical History:  Diagnosis Date  . Chronic abdominal pain   . Diverticulitis   . Drug-seeking behavior   . Hypertension   . Kidney stone   . Kidney stones   . Pancreatitis, chronic ([place]) 05/28/2005    Past Surgical History:  Procedure Laterality Date  . APPENDECTOMY    . CHOLECYSTECTOMY    . ERCP W/ METAL STENT PLACEMENT    . KIDNEY STONE SURGERY      Family History  Problem Relation Age of Onset  . Cancer Mother   . Early death Mother   . Hypertension Mother   . Heart failure Father   . Hypertension Father   . Heart disease Father   . Hyperlipidemia Father   . Pancreatic cancer Paternal Grandmother        possibly  .  Pancreatic cancer Paternal Aunt   . Colon cancer Neg Hx     Social History   Socioeconomic History  . Marital status: Married    Spouse name: Not on file  . Number of children: Not on file  . Years of education: Not on file  . Highest education level: Not on file  Occupational History  . Not on file  Social Needs  . Financial resource strain: Not on file  . Food insecurity    Worry: Not  on file    Inability: Not on file  . Transportation needs    Medical: Not on file    Non-medical: Not on file  Tobacco Use  . Smoking status: Current Every Day Smoker    Packs/day: 0.50    Types: Cigarettes  . Smokeless tobacco: Never Used  Substance and Sexual Activity  . Alcohol use: Yes    Comment: rarely, pt stated that he drinks once ever 3 months and it is usually a beer at dinner  . Drug use: No  . Sexual activity: Yes    Partners: Female  Lifestyle  . Physical activity    Days per week: Not on file    Minutes per session: Not on file  . Stress: Not on file  Relationships  . Social Herbalist on phone: Not on file    Gets together: Not on file    Attends religious service: Not on file    Active member of club or organization: Not on file    Attends meetings of clubs or organizations: Not on file    Relationship status: Not on file  . Intimate partner violence    Fear of current or ex partner: Not on file    Emotionally abused: Not on file    Physically abused: Not on file    Forced sexual activity: Not on file  Other Topics Concern  . Not on file  Social History Narrative  . Not on file     Current Outpatient Medications:  .  amLODipine-benazepril (LOTREL) 5-20 MG capsule, TAKE 1 CAPSULE BY MOUTH EVERY DAY, Disp: 90 capsule, Rfl: 1 .  metoprolol succinate (TOPROL-XL) 100 MG 24 hr tablet, Take 1 tablet (100 mg total) by mouth daily. Take with or immediately following a meal., Disp: 90 tablet, Rfl: 2 .  oxyCODONE (ROXICODONE) 15 MG immediate release tablet, Take 1 tablet (15 mg total) by mouth 2 (two) times daily as needed for pain. Can take an extra tab 1-2 times per week.No more than 70 tabs/month, Disp: 60 tablet, Rfl: 0 .  promethazine (PHENERGAN) 25 MG tablet, TAKE 1 TABLET (25 MG TOTAL) BY MOUTH EVERY 12 (TWELVE) HOURS AS NEEDED., Disp: 45 tablet, Rfl: 1  EXAM:  VITALS per patient if applicable:BP 035/00   GENERAL: alert, oriented, appears well  and in no acute distress  HEENT: atraumatic, normocephalic,conjunctiva clear.  NECK: normal movements of the head and neck  LUNGS: on inspection no signs of respiratory distress, breathing rate appears normal, no obvious gross SOB, gasping or wheezing  CV: no obvious cyanosis  MS: moves all visible extremities without noticeable abnormality  PSYCH/NEURO: pleasant and cooperative, no obvious depression,+ anxious.Speech and thought processing grossly intact  ASSESSMENT AND PLAN:  Discussed the following assessment and plan:  Chronic abdominal pain - Plan: oxyCODONE (ROXICODONE) 15 MG immediate release tablet Chronic opioid treatment. Med contract 03/2018. [place] controlled substance report web site was reviewed. We discussed current chronic pain management guidelines. F/U in  3-4 months.  Chronic midline back pain, unspecified back location - Plan: Ambulatory referral to Orthopedic Surgery, oxyCODONE (ROXICODONE) 15 MG immediate release tablet Getting worse. Referral placed for ortho at [facility] avoiding trigger factors.  Nausea and vomiting in adult - Plan: Chronic. Continue Phenergan 25 mg daily as needed. Side effects discussed. EGD 06/20/18 very mild duodenal erythema,otherwsie normal.   Hypertension, essential, benign - Plan: BP now adequately controlled. No changes in current management. Continue monitoring BP regularly.    I discussed the assessment and treatment plan with the patient. He was provided an opportunity to ask questions and all were answered. He agreed with the plan and demonstrated an understanding of the instructions.     Return in about 4 months (around 04/27/2019) for pain.    Betty Martinique, MD

## 2018-12-27 ENCOUNTER — Encounter: Payer: Self-pay | Admitting: Family Medicine

## 2018-12-27 MED ORDER — METOPROLOL SUCCINATE ER 100 MG PO TB24: 100.0000 mg | ORAL_TABLET | Freq: Every day | ORAL | 2 refills | Status: DC

## 2018-12-27 MED ORDER — OXYCODONE HCL 15 MG PO TABS: 15.0000 mg | ORAL_TABLET | Freq: Two times a day (BID) | ORAL | 0 refills | Status: DC | PRN

## 2019-01-13 ENCOUNTER — Other Ambulatory Visit: Payer: Self-pay | Admitting: Family Medicine

## 2019-01-13 ENCOUNTER — Encounter: Payer: Self-pay | Admitting: Family Medicine

## 2019-01-13 DIAGNOSIS — R112 Nausea with vomiting, unspecified: Secondary | ICD-10-CM

## 2019-01-13 DIAGNOSIS — K861 Other chronic pancreatitis: Secondary | ICD-10-CM

## 2019-01-17 ENCOUNTER — Other Ambulatory Visit: Payer: Self-pay | Admitting: Family Medicine

## 2019-01-17 DIAGNOSIS — M549 Dorsalgia, unspecified: Secondary | ICD-10-CM

## 2019-01-17 DIAGNOSIS — G8929 Other chronic pain: Secondary | ICD-10-CM

## 2019-01-20 MED ORDER — OXYCODONE HCL 15 MG PO TABS: 15.0000 mg | ORAL_TABLET | Freq: Two times a day (BID) | ORAL | 0 refills | Status: DC | PRN

## 2019-01-23 ENCOUNTER — Telehealth: Payer: Self-pay | Admitting: *Deleted

## 2019-01-23 NOTE — Telephone Encounter (Signed)
Refill request for Promenthazine 25 mg tablet Last reill: 11/20/2018, #90,2

## 2019-01-26 NOTE — Telephone Encounter (Signed)
I have not prescribed # 90 tabs. Rx was sent. Betty Martinique, MD

## 2019-02-14 ENCOUNTER — Other Ambulatory Visit: Payer: Self-pay

## 2019-02-14 ENCOUNTER — Encounter (HOSPITAL_BASED_OUTPATIENT_CLINIC_OR_DEPARTMENT_OTHER): Payer: Self-pay | Admitting: Emergency Medicine

## 2019-02-14 ENCOUNTER — Emergency Department (HOSPITAL_BASED_OUTPATIENT_CLINIC_OR_DEPARTMENT_OTHER)
Admission: EM | Admit: 2019-02-14 | Discharge: 2019-02-14 | Disposition: A | Discharge status: Home / Self Care | Payer: BLUE CROSS/BLUE SHIELD | Attending: Emergency Medicine | Admitting: Emergency Medicine

## 2019-02-14 DIAGNOSIS — G8929 Other chronic pain: Secondary | ICD-10-CM

## 2019-02-14 DIAGNOSIS — R109 Unspecified abdominal pain: Secondary | ICD-10-CM | POA: Insufficient documentation

## 2019-02-14 DIAGNOSIS — F17210 Nicotine dependence, cigarettes, uncomplicated: Secondary | ICD-10-CM | POA: Diagnosis not present

## 2019-02-14 DIAGNOSIS — Z79899 Other long term (current) drug therapy: Secondary | ICD-10-CM | POA: Insufficient documentation

## 2019-02-14 DIAGNOSIS — I10 Essential (primary) hypertension: Secondary | ICD-10-CM | POA: Diagnosis not present

## 2019-02-14 LAB — COMPREHENSIVE METABOLIC PANEL
ALT: 23 U/L (ref 0–44)
AST: 19 U/L (ref 15–41)
Albumin: 4.2 g/dL (ref 3.5–5.0)
Alkaline Phosphatase: 78 U/L (ref 38–126)
Anion gap: 11 (ref 5–15)
BUN: 16 mg/dL (ref 6–20)
CO2: 22 mmol/L (ref 22–32)
Calcium: 9.1 mg/dL (ref 8.9–10.3)
Chloride: 107 mmol/L (ref 98–111)
Creatinine, Ser: 0.83 mg/dL (ref 0.61–1.24)
GFR calc Af Amer: >60 mL/min (ref >60)
GFR calc non Af Amer: >60 mL/min (ref >60)
Glucose, Bld: 105 mg/dL — ABNORMAL HIGH (ref 70–99)
Potassium: 3.4 mmol/L — ABNORMAL LOW (ref 3.5–5.1)
Sodium: 140 mmol/L (ref 135–145)
Total Bilirubin: 0.6 mg/dL (ref 0.3–1.2)
Total Protein: 7.2 g/dL (ref 6.5–8.1)

## 2019-02-14 LAB — CBC WITH DIFFERENTIAL/PLATELET
Abs Immature Granulocytes: 0.1 10*3/uL — ABNORMAL HIGH (ref 0.00–0.07)
Basophils Absolute: 0.1 10*3/uL (ref 0.0–0.1)
Basophils Relative: 1 %
Eosinophils Absolute: 0.3 10*3/uL (ref 0.0–0.5)
Eosinophils Relative: 3 %
HCT: 46.8 % (ref 39.0–52.0)
Hemoglobin: 16.2 g/dL (ref 13.0–17.0)
Immature Granulocytes: 1 %
Lymphocytes Relative: 35 %
Lymphs Abs: 4.3 10*3/uL — ABNORMAL HIGH (ref 0.7–4.0)
MCH: 30.1 pg (ref 26.0–34.0)
MCHC: 34.6 g/dL (ref 30.0–36.0)
MCV: 87 fL (ref 80.0–100.0)
Monocytes Absolute: 1.2 10*3/uL — ABNORMAL HIGH (ref 0.1–1.0)
Monocytes Relative: 10 %
Neutro Abs: 6.4 10*3/uL (ref 1.7–7.7)
Neutrophils Relative %: 50 %
Platelets: 255 10*3/uL (ref 150–400)
RBC: 5.38 MIL/uL (ref 4.22–5.81)
RDW: 12.5 % (ref 11.5–15.5)
WBC: 12.3 10*3/uL — ABNORMAL HIGH (ref 4.0–10.5)
nRBC: 0 % (ref 0.0–0.2)

## 2019-02-14 LAB — LIPASE, BLOOD: Lipase: 40 U/L (ref 11–51)

## 2019-02-14 MED ORDER — ONDANSETRON 8 MG PO TBDP: 8.0000 mg | ORAL_TABLET | Freq: Three times a day (TID) | ORAL | 0 refills | Status: DC | PRN

## 2019-02-14 MED ORDER — HYDROMORPHONE HCL 1 MG/ML IJ SOLN
1.0000 mg | Freq: Once | INTRAMUSCULAR | Status: AC
  Administered 2019-02-14: 04:00:00 1 mg via INTRAVENOUS
  Filled 2019-02-14: qty 1

## 2019-02-14 MED ORDER — HYDROMORPHONE HCL 1 MG/ML IJ SOLN
1.0000 mg | Freq: Once | INTRAMUSCULAR | Status: AC
  Administered 2019-02-14: 1 mg via INTRAVENOUS
  Filled 2019-02-14: qty 1

## 2019-02-14 MED ORDER — ONDANSETRON HCL 4 MG/2ML IJ SOLN
4.0000 mg | Freq: Once | INTRAMUSCULAR | Status: AC
  Administered 2019-02-14: 05:00:00 4 mg via INTRAVENOUS
  Filled 2019-02-14: qty 2

## 2019-02-14 MED ORDER — ONDANSETRON HCL 4 MG/2ML IJ SOLN
4.0000 mg | Freq: Once | INTRAMUSCULAR | Status: AC
  Administered 2019-02-14: 4 mg via INTRAVENOUS
  Filled 2019-02-14: qty 2

## 2019-02-14 MED ORDER — PROMETHAZINE HCL 25 MG/ML IJ SOLN
25.0000 mg | Freq: Once | INTRAMUSCULAR | Status: AC
  Administered 2019-02-14: 25 mg via INTRAVENOUS
  Filled 2019-02-14: qty 1

## 2019-02-14 NOTE — ED Triage Notes (Signed)
Patient arrived via POV c/o LUQ abdominal pain radiating to back. Patient states this is similar to other incidents of abdominal pain in chart. Patient is AO x 4, BP elevated, normal gait.

## 2019-02-14 NOTE — ED Provider Notes (Signed)
[place] DEPT [facility] Provider Note: Georgena Spurling, MD, FACEP  CSN: [id number] MRN: [id number] ARRIVAL: 02/14/19 at [place]: [place]  Abdominal Pain   HISTORY OF PRESENT ILLNESS  02/14/19 4:16 AM [name] is a 43 y.o. male who is on chronic pain management for chronic pancreatitis.  He is here with left upper quadrant abdominal pain radiating to his back which began about 10 PM yesterday evening.  His pain is similar to previous exacerbations of his chronic pain.  He has had associated nausea and vomiting which has prevented him from keeping his usual oxycodone down.  He also tried taking Phenergan but threw this back up.  He estimates he has vomited 4 times.  He rates his pain as an 8 out of 10 and describes it as sharp.  It is constant and worse with movement or palpation.    Past Medical History:  Diagnosis Date  . Chronic abdominal pain   . Diverticulitis   . Drug-seeking behavior   . Hypertension   . Kidney stones   . Pancreatitis, chronic ([place]) 05/28/2005    Past Surgical History:  Procedure Laterality Date  . APPENDECTOMY    . CHOLECYSTECTOMY    . ERCP W/ METAL STENT PLACEMENT    . KIDNEY STONE SURGERY      Family History  Problem Relation Age of Onset  . Cancer Mother   . Early death Mother   . Hypertension Mother   . Heart failure Father   . Hypertension Father   . Heart disease Father   . Hyperlipidemia Father   . Pancreatic cancer Paternal Grandmother        possibly  . Pancreatic cancer Paternal Aunt   . Colon cancer Neg Hx     Social History   Tobacco Use  . Smoking status: Current Every Day Smoker    Packs/day: 0.50    Types: Cigarettes  . Smokeless tobacco: Never Used  Substance Use Topics  . Alcohol use: Yes    Comment: rarely, pt stated that he drinks once ever 3 months and it is usually a beer at dinner  . Drug use: No    Prior to Admission medications   Medication Sig Start Date End Date Taking?  Authorizing Provider  amLODipine-benazepril (LOTREL) 5-20 MG capsule TAKE 1 CAPSULE BY MOUTH EVERY DAY 09/26/18   Martinique, Betty G, MD  metoprolol succinate (TOPROL-XL) 100 MG 24 hr tablet Take 1 tablet (100 mg total) by mouth daily. Take with or immediately following a meal. 12/27/18   Martinique, Betty G, MD  oxyCODONE (ROXICODONE) 15 MG immediate release tablet Take 1 tablet (15 mg total) by mouth 2 (two) times daily as needed for pain. Can take an extra tab 1-2 times per week.No more than 70 tabs/month 01/20/19   Martinique, Betty G, MD  promethazine (PHENERGAN) 25 MG tablet TAKE 1 TABLET (25 MG TOTAL) BY MOUTH EVERY 12 (TWELVE) HOURS AS NEEDED. 01/23/19   Martinique, Betty G, MD    Allergies Cortisone, Eggs or egg-derived products, Ketorolac tromethamine, Haloperidol, Hydrocortisone, Iodinated diagnostic agents, Ketorolac tromethamine, Haldol [haloperidol lactate], Ketorolac, and Reglan [metoclopramide]   REVIEW OF SYSTEMS  Negative except as noted here or in the History of Present Illness.   PHYSICAL EXAMINATION  Initial Vital Signs Blood pressure (!) 161/101, pulse 75, temperature 98.7 F (37.1 C), resp. rate 20, height 6\' 1"  (1.854 m), weight 93 kg, SpO2 96 %.  Examination General: Well-developed, well-nourished male in no acute  distress; appearance consistent with age of record HENT: normocephalic; atraumatic Eyes: pupils equal, round and reactive to light; extraocular muscles intact Neck: supple Heart: regular rate and rhythm Lungs: clear to auscultation bilaterally Abdomen: soft; nondistended; epigastric and left upper quadrant tenderness; no masses or hepatosplenomegaly; bowel sounds present Extremities: No deformity; full range of motion; pulses normal Neurologic: Awake, alert and oriented; motor function intact in all extremities and symmetric; no facial droop Skin: Warm and dry Psychiatric: Normal mood and affect   RESULTS  Summary of this visit's results, reviewed by myself:   EKG  Interpretation  Date/Time:    Ventricular Rate:    PR Interval:    QRS Duration:   QT Interval:    QTC Calculation:   R Axis:     Text Interpretation:        Laboratory Studies: Results for orders placed or performed during the hospital encounter of 02/14/19 (from the past 24 hour(s))  CBC with Differential     Status: Abnormal   Collection Time: 02/14/19  3:50 AM  Result Value Ref Range   WBC 12.3 (H) 4.0 - 10.5 K/uL   RBC 5.38 4.22 - 5.81 MIL/uL   Hemoglobin 16.2 13.0 - 17.0 g/dL   HCT 46.8 39.0 - 52.0 %   MCV 87.0 80.0 - 100.0 fL   MCH 30.1 26.0 - 34.0 pg   MCHC 34.6 30.0 - 36.0 g/dL   RDW 12.5 11.5 - 15.5 %   Platelets 255 150 - 400 K/uL   nRBC 0.0 0.0 - 0.2 %   Neutrophils Relative % 50 %   Neutro Abs 6.4 1.7 - 7.7 K/uL   Lymphocytes Relative 35 %   Lymphs Abs 4.3 (H) 0.7 - 4.0 K/uL   Monocytes Relative 10 %   Monocytes Absolute 1.2 (H) 0.1 - 1.0 K/uL   Eosinophils Relative 3 %   Eosinophils Absolute 0.3 0.0 - 0.5 K/uL   Basophils Relative 1 %   Basophils Absolute 0.1 0.0 - 0.1 K/uL   Immature Granulocytes 1 %   Abs Immature Granulocytes 0.10 (H) 0.00 - 0.07 K/uL  Comprehensive metabolic panel     Status: Abnormal   Collection Time: 02/14/19  3:50 AM  Result Value Ref Range   Sodium 140 135 - 145 mmol/L   Potassium 3.4 (L) 3.5 - 5.1 mmol/L   Chloride 107 98 - 111 mmol/L   CO2 22 22 - 32 mmol/L   Glucose, Bld 105 (H) 70 - 99 mg/dL   BUN 16 6 - 20 mg/dL   Creatinine, Ser 0.83 0.61 - 1.24 mg/dL   Calcium 9.1 8.9 - 10.3 mg/dL   Total Protein 7.2 6.5 - 8.1 g/dL   Albumin 4.2 3.5 - 5.0 g/dL   AST 19 15 - 41 U/L   ALT 23 0 - 44 U/L   Alkaline Phosphatase 78 38 - 126 U/L   Total Bilirubin 0.6 0.3 - 1.2 mg/dL   GFR calc non Af Amer >60 >60 mL/min   GFR calc Af Amer >60 >60 mL/min   Anion gap 11 5 - 15  Lipase, blood     Status: None   Collection Time: 02/14/19  3:50 AM  Result Value Ref Range   Lipase 40 11 - 51 U/L   Imaging Studies: No results found.   ED COURSE and MDM  Nursing notes and initial vitals signs, including pulse oximetry, reviewed.  Vitals:   02/14/19 0333  BP: (!) 161/101  Pulse: 75  Resp: 20  Temp: 98.7 F (37.1 C)  SpO2: 96%  Weight: 93 kg  Height: 6\' 1"  (1.854 m)   5:41 AM Patient requesting additional dose of Zofran and Dilaudid prior to discharge.  He understands he is under pain management and cannot get prescriptions for narcotics but would like a prescription for ODT Zofran.  PROCEDURES    ED DIAGNOSES     ICD-10-CM   1. Chronic abdominal pain  R10.9    G89.29        Kit Mollett, Jenny Reichmann, MD 02/14/19 [phone]

## 2019-02-15 ENCOUNTER — Other Ambulatory Visit: Payer: Self-pay

## 2019-02-15 ENCOUNTER — Emergency Department (HOSPITAL_BASED_OUTPATIENT_CLINIC_OR_DEPARTMENT_OTHER)
Admission: EM | Admit: 2019-02-15 | Discharge: 2019-02-15 | Disposition: A | Discharge status: Home / Self Care | Payer: BLUE CROSS/BLUE SHIELD | Attending: Emergency Medicine | Admitting: Emergency Medicine

## 2019-02-15 ENCOUNTER — Encounter (HOSPITAL_BASED_OUTPATIENT_CLINIC_OR_DEPARTMENT_OTHER): Payer: Self-pay

## 2019-02-15 DIAGNOSIS — F17210 Nicotine dependence, cigarettes, uncomplicated: Secondary | ICD-10-CM | POA: Insufficient documentation

## 2019-02-15 DIAGNOSIS — R1013 Epigastric pain: Secondary | ICD-10-CM | POA: Diagnosis not present

## 2019-02-15 DIAGNOSIS — I10 Essential (primary) hypertension: Secondary | ICD-10-CM | POA: Insufficient documentation

## 2019-02-15 DIAGNOSIS — R112 Nausea with vomiting, unspecified: Secondary | ICD-10-CM | POA: Diagnosis not present

## 2019-02-15 DIAGNOSIS — R1010 Upper abdominal pain, unspecified: Secondary | ICD-10-CM

## 2019-02-15 DIAGNOSIS — Z79899 Other long term (current) drug therapy: Secondary | ICD-10-CM | POA: Diagnosis not present

## 2019-02-15 DIAGNOSIS — G8929 Other chronic pain: Secondary | ICD-10-CM | POA: Diagnosis not present

## 2019-02-15 LAB — COMPREHENSIVE METABOLIC PANEL
ALT: 24 U/L (ref 0–44)
AST: 20 U/L (ref 15–41)
Albumin: 4.2 g/dL (ref 3.5–5.0)
Alkaline Phosphatase: 75 U/L (ref 38–126)
Anion gap: 10 (ref 5–15)
BUN: 13 mg/dL (ref 6–20)
CO2: 23 mmol/L (ref 22–32)
Calcium: 9.3 mg/dL (ref 8.9–10.3)
Chloride: 105 mmol/L (ref 98–111)
Creatinine, Ser: 0.82 mg/dL (ref 0.61–1.24)
GFR calc Af Amer: >60 mL/min (ref >60)
GFR calc non Af Amer: >60 mL/min (ref >60)
Glucose, Bld: 109 mg/dL — ABNORMAL HIGH (ref 70–99)
Potassium: 3.5 mmol/L (ref 3.5–5.1)
Sodium: 138 mmol/L (ref 135–145)
Total Bilirubin: 0.4 mg/dL (ref 0.3–1.2)
Total Protein: 7.2 g/dL (ref 6.5–8.1)

## 2019-02-15 LAB — RAPID URINE DRUG SCREEN, HOSP PERFORMED
Amphetamines: NOT DETECTED
Barbiturates: NOT DETECTED
Benzodiazepines: NOT DETECTED
Cocaine: NOT DETECTED
Opiates: NOT DETECTED
Tetrahydrocannabinol: NOT DETECTED

## 2019-02-15 LAB — LIPASE, BLOOD: Lipase: 37 U/L (ref 11–51)

## 2019-02-15 LAB — CBC
HCT: 44.9 % (ref 39.0–52.0)
Hemoglobin: 15.8 g/dL (ref 13.0–17.0)
MCH: 30.3 pg (ref 26.0–34.0)
MCHC: 35.2 g/dL (ref 30.0–36.0)
MCV: 86 fL (ref 80.0–100.0)
Platelets: 244 10*3/uL (ref 150–400)
RBC: 5.22 MIL/uL (ref 4.22–5.81)
RDW: 12.3 % (ref 11.5–15.5)
WBC: 11 10*3/uL — ABNORMAL HIGH (ref 4.0–10.5)
nRBC: 0 % (ref 0.0–0.2)

## 2019-02-15 MED ORDER — ONDANSETRON HCL 4 MG/2ML IJ SOLN
4.0000 mg | Freq: Once | INTRAMUSCULAR | Status: AC
  Administered 2019-02-15: 4 mg via INTRAVENOUS
  Filled 2019-02-15: qty 2

## 2019-02-15 MED ORDER — SODIUM CHLORIDE 0.9 % IV BOLUS
1000.0000 mL | Freq: Once | INTRAVENOUS | Status: AC
  Administered 2019-02-15: 1000 mL via INTRAVENOUS

## 2019-02-15 MED ORDER — HYDROMORPHONE HCL 1 MG/ML IJ SOLN
1.0000 mg | Freq: Once | INTRAMUSCULAR | Status: AC
  Administered 2019-02-15: 1 mg via INTRAVENOUS
  Filled 2019-02-15: qty 1

## 2019-02-15 MED ORDER — PANTOPRAZOLE SODIUM 40 MG IV SOLR
40.0000 mg | Freq: Once | INTRAVENOUS | Status: AC
  Administered 2019-02-15: 40 mg via INTRAVENOUS
  Filled 2019-02-15: qty 40

## 2019-02-15 NOTE — ED Provider Notes (Addendum)
[place] EMERGENCY DEPARTMENT Provider Note   CSN: [id number] Arrival date & time: 02/15/19  1821     History   Chief Complaint Chief Complaint  Patient presents with  . Abdominal Pain    HPI [name] is a 43 y.o. male.     Patient with hx chronic pancreatitis, chronic abd pain syndrome, c/o epigastric pain and nausea and vomiting for past 2 days. Symptoms acute onset, moderate, persistent, constant, non radiating. Emesis clear, not bloody or bilious. Having normal bms, no diarrhea or constipation. No abd distension. No fever or chills. No chest pain or discomfort. Denies etoh abuse. Remote hx appendectomy and cholecystectomy.  The history is provided by the patient.  Abdominal Pain Associated symptoms: vomiting   Associated symptoms: no chest pain, no cough, no diarrhea, no dysuria, no fever, no shortness of breath and no sore throat     Past Medical History:  Diagnosis Date  . Chronic abdominal pain   . Diverticulitis   . Drug-seeking behavior   . Hypertension   . Kidney stones   . Pancreatitis, chronic ([place]) 05/28/2005    Patient Active Problem List   Diagnosis Date Noted  . Chronic pain disorder 08/29/2018  . Back pain, chronic 08/29/2018  . Nausea and vomiting in adult 02/25/2018  . Hypertension, essential, benign 01/18/2018  . Chronic biliary pancreatitis ([place]) 01/18/2018  . Chronic abdominal pain 01/14/2018    Past Surgical History:  Procedure Laterality Date  . APPENDECTOMY    . CHOLECYSTECTOMY    . ERCP W/ METAL STENT PLACEMENT    . KIDNEY STONE SURGERY          Home Medications    Prior to Admission medications   Medication Sig Start Date End Date Taking? Authorizing Provider  amLODipine-benazepril (LOTREL) 5-20 MG capsule TAKE 1 CAPSULE BY MOUTH EVERY DAY 09/26/18   Martinique, Betty G, MD  metoprolol succinate (TOPROL-XL) 100 MG 24 hr tablet Take 1 tablet (100 mg total) by mouth daily. Take with or immediately following a  meal. 12/27/18   Martinique, Betty G, MD  ondansetron (ZOFRAN ODT) 8 MG disintegrating tablet Take 1 tablet (8 mg total) by mouth every 8 (eight) hours as needed for nausea or vomiting. 02/14/19   Molpus, Jenny Reichmann, MD  oxyCODONE (ROXICODONE) 15 MG immediate release tablet Take 1 tablet (15 mg total) by mouth 2 (two) times daily as needed for pain. Can take an extra tab 1-2 times per week.No more than 70 tabs/month 01/20/19   Martinique, Betty G, MD  promethazine (PHENERGAN) 25 MG tablet TAKE 1 TABLET (25 MG TOTAL) BY MOUTH EVERY 12 (TWELVE) HOURS AS NEEDED. 01/23/19   Martinique, Betty G, MD    Family History Family History  Problem Relation Age of Onset  . Cancer Mother   . Early death Mother   . Hypertension Mother   . Heart failure Father   . Hypertension Father   . Heart disease Father   . Hyperlipidemia Father   . Pancreatic cancer Paternal Grandmother        possibly  . Pancreatic cancer Paternal Aunt   . Colon cancer Neg Hx     Social History Social History   Tobacco Use  . Smoking status: Current Every Day Smoker    Packs/day: 0.50    Types: Cigarettes  . Smokeless tobacco: Never Used  Substance Use Topics  . Alcohol use: Yes    Comment: rarely, pt stated that he drinks once ever 3 months and it  is usually a beer at dinner  . Drug use: No     Allergies   Cortisone, Eggs or egg-derived products, Ketorolac tromethamine, Haloperidol, Hydrocortisone, Iodinated diagnostic agents, Ketorolac tromethamine, Haldol [haloperidol lactate], Ketorolac, and Reglan [metoclopramide]   Review of Systems Review of Systems  Constitutional: Negative for fever.  HENT: Negative for sore throat.   Eyes: Negative for redness.  Respiratory: Negative for cough and shortness of breath.   Cardiovascular: Negative for chest pain.  Gastrointestinal: Positive for abdominal pain and vomiting. Negative for diarrhea.  Endocrine: Negative for polyuria.  Genitourinary: Negative for dysuria and flank pain.   Musculoskeletal: Negative for back pain and neck pain.  Skin: Negative for rash.  Neurological: Negative for headaches.  Hematological: Does not bruise/bleed easily.  Psychiatric/Behavioral: Negative for confusion.     Physical Exam Updated Vital Signs BP (!) 153/100 (BP Location: Right Arm)   Pulse 97   Temp 98.2 F (36.8 C) (Oral)   Resp 18   Ht 1.854 m (6\' 1" )   Wt 93 kg   SpO2 98%   BMI 27.05 kg/m   Physical Exam Vitals signs and nursing note reviewed.  Constitutional:      Appearance: Normal appearance. He is well-developed.  HENT:     Head: Atraumatic.     Nose: Nose normal.     Mouth/Throat:     Mouth: Mucous membranes are moist.     Pharynx: Oropharynx is clear.  Eyes:     General: No scleral icterus.    Conjunctiva/sclera: Conjunctivae normal.  Neck:     Musculoskeletal: Normal range of motion and neck supple. No neck rigidity.     Trachea: No tracheal deviation.  Cardiovascular:     Rate and Rhythm: Normal rate and regular rhythm.     Pulses: Normal pulses.     Heart sounds: Normal heart sounds. No murmur. No friction rub. No gallop.   Pulmonary:     Effort: Pulmonary effort is normal. No accessory muscle usage or respiratory distress.     Breath sounds: Normal breath sounds.  Abdominal:     General: Bowel sounds are normal. There is no distension.     Palpations: Abdomen is soft. There is no mass.     Tenderness: There is no abdominal tenderness. There is no guarding or rebound.     Hernia: No hernia is present.  Genitourinary:    Comments: No cva tenderness. Musculoskeletal:        General: No swelling or tenderness.  Skin:    General: Skin is warm and dry.     Findings: No rash.  Neurological:     Mental Status: He is alert.     Comments: Alert, speech clear.   Psychiatric:        Mood and Affect: Mood normal.      ED Treatments / Results  Labs (all labs ordered are listed, but only abnormal results are displayed) Results for orders  placed or performed during the hospital encounter of 02/15/19  CBC  Result Value Ref Range   WBC 11.0 (H) 4.0 - 10.5 K/uL   RBC 5.22 4.22 - 5.81 MIL/uL   Hemoglobin 15.8 13.0 - 17.0 g/dL   HCT 44.9 39.0 - 52.0 %   MCV 86.0 80.0 - 100.0 fL   MCH 30.3 26.0 - 34.0 pg   MCHC 35.2 30.0 - 36.0 g/dL   RDW 12.3 11.5 - 15.5 %   Platelets 244 150 - 400 K/uL   nRBC 0.0 0.0 -  0.2 %  Comprehensive metabolic panel  Result Value Ref Range   Sodium 138 135 - 145 mmol/L   Potassium 3.5 3.5 - 5.1 mmol/L   Chloride 105 98 - 111 mmol/L   CO2 23 22 - 32 mmol/L   Glucose, Bld 109 (H) 70 - 99 mg/dL   BUN 13 6 - 20 mg/dL   Creatinine, Ser 0.82 0.61 - 1.24 mg/dL   Calcium 9.3 8.9 - 10.3 mg/dL   Total Protein 7.2 6.5 - 8.1 g/dL   Albumin 4.2 3.5 - 5.0 g/dL   AST 20 15 - 41 U/L   ALT 24 0 - 44 U/L   Alkaline Phosphatase 75 38 - 126 U/L   Total Bilirubin 0.4 0.3 - 1.2 mg/dL   GFR calc non Af Amer >60 >60 mL/min   GFR calc Af Amer >60 >60 mL/min   Anion gap 10 5 - 15  Lipase, blood  Result Value Ref Range   Lipase 37 11 - 51 U/L  Rapid urine drug screen (hospital performed)  Result Value Ref Range   Opiates NONE DETECTED NONE DETECTED   Cocaine NONE DETECTED NONE DETECTED   Benzodiazepines NONE DETECTED NONE DETECTED   Amphetamines NONE DETECTED NONE DETECTED   Tetrahydrocannabinol NONE DETECTED NONE DETECTED   Barbiturates NONE DETECTED NONE DETECTED    EKG None  Radiology No results found.  Procedures Procedures (including critical care time)  Medications Ordered in ED Medications  sodium chloride 0.9 % bolus 1,000 mL (has no administration in time range)  HYDROmorphone (DILAUDID) injection 1 mg (has no administration in time range)  pantoprazole (PROTONIX) injection 40 mg (has no administration in time range)  ondansetron (ZOFRAN) injection 4 mg (has no administration in time range)     Initial Impression / Assessment and Plan / ED Course  I have reviewed the triage vital signs  and the nursing notes.  Pertinent labs & imaging results that were available during my care of the patient were reviewed by me and considered in my medical decision making (see chart for details).  Iv ns bolus. protonix iv. Dilaudid iv. zofran iv.   Reviewed nursing notes and prior charts for additional history.   Labs sent.   Labs reviewed by me - lipase normal. Chem normal.  Recheck pt, tolerating po. No recurrent emesis in ED.   Abd soft nt. Afebrile. Pt notes pain improved but persists.   Dilaudid 1 mg iv. Additional 1 liter ns.   Trial of po fluids.  Pt is tolerating po. No recurrent nv in ED.   Patient appears stable for d/c.     Final Clinical Impressions(s) / ED Diagnoses   Final diagnoses:  None    ED Discharge Orders    None         Lajean Saver, MD 02/15/19 1958

## 2019-02-15 NOTE — ED Triage Notes (Addendum)
Pt was seen for abdominal pain late Friday night, discharged home. Since then has not been able to hold down food/fluids, unable to take zofran or phenergan as prescribed.  Pain upper left abdomen.  LBM yesterday.

## 2019-02-15 NOTE — Discharge Instructions (Signed)
It was our pleasure to provide your ER care today - we hope that you feel better.  Drink plenty of fluids. Continue acid blocker medication. Take zofran as need for nausea.   Follow up with primary care doctor in the next 1-2 days if symptoms fail to improve/resolve.  Return to ER if worse, new symptoms, fevers, persistent vomiting, worsening or severe pain, or other concern.   You were given pain medication in the ER - no driving for the next 8 hours.

## 2019-02-15 NOTE — ED Notes (Signed)
Pt tolerating sips of PO fluids

## 2019-02-17 DIAGNOSIS — Z91012 Allergy to eggs: Secondary | ICD-10-CM | POA: Diagnosis not present

## 2019-02-17 DIAGNOSIS — Z9089 Acquired absence of other organs: Secondary | ICD-10-CM | POA: Diagnosis not present

## 2019-02-17 DIAGNOSIS — R112 Nausea with vomiting, unspecified: Secondary | ICD-10-CM | POA: Diagnosis not present

## 2019-02-17 DIAGNOSIS — K861 Other chronic pancreatitis: Secondary | ICD-10-CM | POA: Diagnosis not present

## 2019-02-17 DIAGNOSIS — Z9049 Acquired absence of other specified parts of digestive tract: Secondary | ICD-10-CM | POA: Diagnosis not present

## 2019-02-17 DIAGNOSIS — Z888 Allergy status to other drugs, medicaments and biological substances status: Secondary | ICD-10-CM | POA: Diagnosis not present

## 2019-02-17 DIAGNOSIS — F17210 Nicotine dependence, cigarettes, uncomplicated: Secondary | ICD-10-CM | POA: Diagnosis not present

## 2019-02-17 DIAGNOSIS — Z79899 Other long term (current) drug therapy: Secondary | ICD-10-CM | POA: Diagnosis not present

## 2019-02-19 DIAGNOSIS — Z87442 Personal history of urinary calculi: Secondary | ICD-10-CM | POA: Diagnosis not present

## 2019-02-19 DIAGNOSIS — K859 Acute pancreatitis without necrosis or infection, unspecified: Secondary | ICD-10-CM | POA: Diagnosis not present

## 2019-02-19 DIAGNOSIS — Z888 Allergy status to other drugs, medicaments and biological substances status: Secondary | ICD-10-CM | POA: Diagnosis not present

## 2019-02-19 DIAGNOSIS — K861 Other chronic pancreatitis: Secondary | ICD-10-CM | POA: Diagnosis not present

## 2019-02-19 DIAGNOSIS — Z91012 Allergy to eggs: Secondary | ICD-10-CM | POA: Diagnosis not present

## 2019-02-19 DIAGNOSIS — R112 Nausea with vomiting, unspecified: Secondary | ICD-10-CM | POA: Diagnosis not present

## 2019-02-19 DIAGNOSIS — Z720 Tobacco use: Secondary | ICD-10-CM | POA: Diagnosis not present

## 2019-02-19 DIAGNOSIS — Z886 Allergy status to analgesic agent status: Secondary | ICD-10-CM | POA: Diagnosis not present

## 2019-02-19 DIAGNOSIS — I10 Essential (primary) hypertension: Secondary | ICD-10-CM | POA: Diagnosis not present

## 2019-02-19 DIAGNOSIS — Z79899 Other long term (current) drug therapy: Secondary | ICD-10-CM | POA: Diagnosis not present

## 2019-02-20 ENCOUNTER — Encounter: Payer: Self-pay | Admitting: Family Medicine

## 2019-02-20 ENCOUNTER — Other Ambulatory Visit: Payer: Self-pay

## 2019-02-20 ENCOUNTER — Ambulatory Visit: Payer: BLUE CROSS/BLUE SHIELD | Admitting: Family Medicine

## 2019-02-20 VITALS — BP 122/70 | HR 79 | Temp 98.4°F | Resp 12 | Ht 73.0 in | Wt 210.1 lb

## 2019-02-20 DIAGNOSIS — G8929 Other chronic pain: Secondary | ICD-10-CM

## 2019-02-20 DIAGNOSIS — G894 Chronic pain syndrome: Secondary | ICD-10-CM

## 2019-02-20 DIAGNOSIS — R109 Unspecified abdominal pain: Secondary | ICD-10-CM

## 2019-02-20 DIAGNOSIS — Z79891 Long term (current) use of opiate analgesic: Secondary | ICD-10-CM

## 2019-02-20 DIAGNOSIS — R112 Nausea with vomiting, unspecified: Secondary | ICD-10-CM | POA: Diagnosis not present

## 2019-02-20 MED ORDER — PROMETHAZINE HCL 25 MG RE SUPP: 25.0000 mg | Freq: Three times a day (TID) | RECTAL | 2 refills | Status: DC | PRN

## 2019-02-20 MED ORDER — HYDROCODONE-ACETAMINOPHEN 10-325 MG PO TABS: 1.0000 | ORAL_TABLET | Freq: Three times a day (TID) | ORAL | 0 refills | Status: DC | PRN

## 2019-02-20 NOTE — Patient Instructions (Signed)
A few things to remember from today's visit:   Nausea and vomiting in adult - Plan: promethazine (PHENERGAN) 25 MG suppository  Chronic pain disorder - Plan: HYDROcodone-acetaminophen (NORCO) 10-325 MG tablet  Chronic abdominal pain - Plan: HYDROcodone-acetaminophen (NORCO) 10-325 MG tablet  Chronic prescription opiate use - Plan: HYDROcodone-acetaminophen (NORCO) 10-325 MG tablet  Today oxycodone was discontinued, start hydrocodone 10 mg to take 3 times per day. Phenergan suppositories sent to your pharmacy. No changes in rest of your medications.  Please be sure medication list is accurate. If a new problem present, please set up appointment sooner than planned today.

## 2019-02-20 NOTE — Progress Notes (Signed)
HPI:   [name] is a 43 y.o. male, who is here today to follow on recent ER.  He was last seen here in the office on 12/26/18. Hx of chronic pain,abdominal and lower back as well as nausea and vomiting.  He has frequent ER visit because worsening pain,nausea,and vomiting. He presented to the ER twice since his last OV, 02/14/19 and 02/15/19 due to abdominal pain and nausea/vomiting. Epigastric abdominal pain,not radiated and clear emesis, with mild "pink" once, after several episodes. He denies heartburn or frequent burping.  He has not identified exacerbating or alleviating symptoms.  Pain is back to his baseline. For nausea and vomiting he is on oral Phenergan 25 mg and Zofran 4 mg.  Still nauseated, he is on oral phenergan but frequently he can not hold it down,so he is requesting suppositories.  He has taken Reglan before and discontinued because "TIA" like symptoms, left facial weakness.  Lab Results  Component Value Date   LIPASE 37 02/15/2019   Lab Results  Component Value Date   CREATININE 0.82 02/15/2019   BUN 13 02/15/2019   NA 138 02/15/2019   K 3.5 02/15/2019   CL 105 02/15/2019   CO2 23 02/15/2019   Lab Results  Component Value Date   ALT 24 02/15/2019   AST 20 02/15/2019   ALKPHOS 75 02/15/2019   BILITOT 0.4 02/15/2019   Lab Results  Component Value Date   WBC 11.0 (H) 02/15/2019   HGB 15.8 02/15/2019   HCT 44.9 02/15/2019   MCV 86.0 02/15/2019   PLT 244 02/15/2019    Lower back pain has not been severe and because severity of abdominal pain he has not even noted back pain.  Denies fever,chills, CP.dyspnea,palpitation,dyspnea,chnages in bowel habits,or urinary symptoms. Pain is mainly upper abdomen; RUQ and epigastrium but sometimes he also has left sided abdominal pain.  Hx of chronic pancreatitis. He has followed with GI,otherwsie negative GI work up. Suggested radicular pain due to chronic back pain. Thoracic and lumbar MRI  have been ordered x 2, insurance did not cover imaging done here in town,so a 2nd referral placed for imaging to be done at [facility] [place], has not had time to schedule appt.  He is on Oxycodone 15 mg bid and an extra tab as needed, max # 70 tabs per day. He is having pain daily. Denies side effects from medications.  Now pain is 7/10, improved.  Abdominal/pelvic CT 04/2018: 1. No acute intra-abdominal or pelvic pathology. No bowel obstruction or active inflammation. 2. Fatty liver. 3. Cholecystectomy with mild intrahepatic biliary ductal dilatation and pneumobilia.   Review of Systems  Constitutional: Positive for fatigue. Negative for activity change, appetite change, chills, fever and unexpected weight change.  HENT: Negative for sore throat and trouble swallowing.   Respiratory: Negative for cough and wheezing.   Cardiovascular: Negative for leg swelling.  Gastrointestinal: Negative for abdominal distention and blood in stool.  Genitourinary: Negative for decreased urine volume, dysuria and hematuria.  Musculoskeletal: Negative for gait problem and myalgias.  Skin: Negative for pallor and rash.  Neurological: Negative for syncope, weakness and headaches.  Psychiatric/Behavioral: Negative for confusion. The patient is nervous/anxious.   Rest see pertinent positives and negatives per HPI.   Current Outpatient Medications on File Prior to Visit  Medication Sig Dispense Refill  . amLODipine-benazepril (LOTREL) 5-20 MG capsule TAKE 1 CAPSULE BY MOUTH EVERY DAY 90 capsule 1  . metoprolol succinate (TOPROL-XL) 100 MG 24 hr tablet  Take 1 tablet (100 mg total) by mouth daily. Take with or immediately following a meal. 90 tablet 2  . ondansetron (ZOFRAN ODT) 8 MG disintegrating tablet Take 1 tablet (8 mg total) by mouth every 8 (eight) hours as needed for nausea or vomiting. 20 tablet 0  . oxyCODONE (ROXICODONE) 15 MG immediate release tablet Take 1 tablet (15 mg total) by mouth 2 (two) times  daily as needed for pain. Can take an extra tab 1-2 times per week.No more than 70 tabs/month 70 tablet 0  . promethazine (PHENERGAN) 25 MG tablet TAKE 1 TABLET (25 MG TOTAL) BY MOUTH EVERY 12 (TWELVE) HOURS AS NEEDED. 45 tablet 1   No current facility-administered medications on file prior to visit.      Past Medical History:  Diagnosis Date  . Chronic abdominal pain   . Diverticulitis   . Drug-seeking behavior   . Hypertension   . Kidney stones   . Pancreatitis, chronic ([place]) 05/28/2005   Allergies  Allergen Reactions  . Cortisone Other (See Comments)    drops potassium level "bottoms out" potassium level drops potassium level Bottoms out potassium  "bottoms out" potassium level Other reaction(s): Other (See Comments) Bottoms out potassium  . Eggs Or Egg-Derived Products Hives and Other (See Comments)    Rash  Rash  . Ketorolac Tromethamine Hives and Shortness Of Breath  . Haloperidol Other (See Comments)    "jittery" "jittery"   . Hydrocortisone Hives    Also drops potassium level  . Iodinated Diagnostic Agents Nausea And Vomiting and Other (See Comments)    Hives Hives Hives Hives  . Ketorolac Tromethamine Hives  . Haldol [Haloperidol Lactate] Other (See Comments)    "jittery"   . Ketorolac Hives and Other (See Comments)    Hives, slightly labored breathing  Hives, slightly labored breathing  . Reglan [Metoclopramide]     Face "draws" and gets figgety    Social History   Socioeconomic History  . Marital status: Married    Spouse name: Not on file  . Number of children: Not on file  . Years of education: Not on file  . Highest education level: Not on file  Occupational History  . Not on file  Social Needs  . Financial resource strain: Not on file  . Food insecurity    Worry: Not on file    Inability: Not on file  . Transportation needs    Medical: Not on file    Non-medical: Not on file  Tobacco Use  . Smoking status: Current Every Day  Smoker    Packs/day: 0.50    Types: Cigarettes  . Smokeless tobacco: Never Used  Substance and Sexual Activity  . Alcohol use: Yes    Comment: rarely, pt stated that he drinks once ever 3 months and it is usually a beer at dinner  . Drug use: No  . Sexual activity: Yes    Partners: Female  Lifestyle  . Physical activity    Days per week: Not on file    Minutes per session: Not on file  . Stress: Not on file  Relationships  . Social Herbalist on phone: Not on file    Gets together: Not on file    Attends religious service: Not on file    Active member of club or organization: Not on file    Attends meetings of clubs or organizations: Not on file    Relationship status: Not on file  Other Topics  Concern  . Not on file  Social History Narrative  . Not on file    Vitals:   02/20/19 1133  BP: 122/70  Pulse: 79  Resp: 12  Temp: 98.4 F (36.9 C)  SpO2: 97%   Body mass index is 27.72 kg/m.   Physical Exam  Nursing note and vitals reviewed. Constitutional: He is oriented to person, place, and time. He appears well-developed. No distress.  HENT:  Head: Normocephalic and atraumatic.  Mouth/Throat: Oropharynx is clear and moist and mucous membranes are normal.  Eyes: Pupils are equal, round, and reactive to light. Conjunctivae are normal.  Cardiovascular: Normal rate and regular rhythm.  No murmur heard. Pulses:      Dorsalis pedis pulses are 2+ on the right side and 2+ on the left side.  Respiratory: Effort normal and breath sounds normal. No respiratory distress.  GI: Soft. He exhibits no mass. There is no hepatomegaly. There is abdominal tenderness in the epigastric area and left upper quadrant. There is no rigidity, no rebound and no guarding.  Musculoskeletal:        General: No edema.  Lymphadenopathy:    He has no cervical adenopathy.  Neurological: He is alert and oriented to person, place, and time. He has normal strength. No cranial nerve deficit.  Gait normal.  Skin: Skin is warm. No rash noted. No erythema.  Psychiatric: His mood appears anxious.  Well groomed, good eye contact.    ASSESSMENT AND PLAN:  Mr. Alishan was seen today for hospitalization follow-up.  Diagnoses and all orders for this visit:  Nausea and vomiting in adult Persistent. Possible causes discussed. ? Gastroparesis, dyspepsia. He does not want to try Reglan again. Phenergan changed to suppositories. No changes in Zofran.  -     promethazine (PHENERGAN) 25 MG suppository; Place 1 suppository (25 mg total) rectally every 8 (eight) hours as needed for nausea or vomiting.  Chronic pain disorder Pain is not well controlled. He agrees with changing from Oxycodone 15 mg bid to Hydrocodone-Acetaminophen 10-325 mg tid. Side effects of meds discussed. F/U in 2 weeks.  -     HYDROcodone-acetaminophen (NORCO) 10-325 MG tablet; Take 1 tablet by mouth every 8 (eight) hours as needed.  Chronic abdominal pain Unknown etiology, ? Radicular, chronic pancreatitis.  -     HYDROcodone-acetaminophen (NORCO) 10-325 MG tablet; Take 1 tablet by mouth every 8 (eight) hours as needed.  Chronic prescription opiate use We discussed current guidelines for chronic pain management with opioids. Med contract is current. [place] controlled subs report reviewed.  -     HYDROcodone-acetaminophen (NORCO) 10-325 MG tablet; Take 1 tablet by mouth every 8 (eight) hours as needed.    Return in about 2 weeks (around 03/06/2019) for virtual visit.     Haden Cavenaugh G. Martinique, MD  [facility] [place] office.

## 2019-03-04 ENCOUNTER — Other Ambulatory Visit: Payer: Self-pay

## 2019-03-04 ENCOUNTER — Telehealth (INDEPENDENT_AMBULATORY_CARE_PROVIDER_SITE_OTHER): Payer: BLUE CROSS/BLUE SHIELD | Admitting: Family Medicine

## 2019-03-04 DIAGNOSIS — R109 Unspecified abdominal pain: Secondary | ICD-10-CM

## 2019-03-04 DIAGNOSIS — K861 Other chronic pancreatitis: Secondary | ICD-10-CM

## 2019-03-04 DIAGNOSIS — M549 Dorsalgia, unspecified: Secondary | ICD-10-CM | POA: Diagnosis not present

## 2019-03-04 DIAGNOSIS — R112 Nausea with vomiting, unspecified: Secondary | ICD-10-CM | POA: Diagnosis not present

## 2019-03-04 DIAGNOSIS — G8929 Other chronic pain: Secondary | ICD-10-CM

## 2019-03-04 MED ORDER — PROMETHAZINE HCL 25 MG PO TABS: 25.0000 mg | ORAL_TABLET | Freq: Two times a day (BID) | ORAL | 1 refills | Status: DC | PRN

## 2019-03-04 MED ORDER — OXYCODONE HCL 15 MG PO TABS: 15.0000 mg | ORAL_TABLET | Freq: Two times a day (BID) | ORAL | 0 refills | Status: DC | PRN

## 2019-03-04 NOTE — Progress Notes (Signed)
Virtual Visit via Video Note   I connected with [name] on 03/04/19 by a video enabled telemedicine application and verified that I am speaking with the correct person using two identifiers.  Location patient: home Location provider:work office Persons participating in the virtual visit: patient, provider  I discussed the limitations of evaluation and management by telemedicine and the availability of in person appointments. The patient expressed understanding and agreed to proceed.   HPI: [name] is a 43 year old male with history of hypertension and chronic pain (chronic pancreatitis and back pain) seen today for follow-up. Frequent ER visits due to abdominal pain,nausea,and vomiting. Last ER visit 02/15/19. Also ER visits on 10/17/18 and 02/14/19. Less ER visits since started back on opoid med for pain management.  He was last seen on 02/20/19 when Oxycodone was changed to Hydrocodone 10-325 mg tid due to nausea and pain not been adequately controlled. He feels like Oxycodone was more effective with pain management. Nausea and vomiting did not change.  He is on Phenergan 25 mg oral or suppository and Zofran 4 mg tid as needed. He has tried Reglan, discontinued because side effects.  He has not had vomiting since his last visit.  Negative for fever,chills,fatigue,night sweats, or abnormal wt loss. Negative for CP,dyspnea,palpitations,changes in bowel habits,or urinary symptom.  He is taking Phenergan tablets and Zofran as needed, he also has Phenergan suppositories in case of vomiting.  -He also has some questions about his daughter, concerned about possible dyslexia. She is having a hard time with school,truoble reading assignments.No prior Hx of learning disabilities or ADHD.  ROS: See pertinent positives and negatives per HPI.  Past Medical History:  Diagnosis Date  . Chronic abdominal pain   . Diverticulitis   . Drug-seeking behavior   . Hypertension   . Kidney stones    . Pancreatitis, chronic ([place]) 05/28/2005    Past Surgical History:  Procedure Laterality Date  . APPENDECTOMY    . CHOLECYSTECTOMY    . ERCP W/ METAL STENT PLACEMENT    . KIDNEY STONE SURGERY      Family History  Problem Relation Age of Onset  . Cancer Mother   . Early death Mother   . Hypertension Mother   . Heart failure Father   . Hypertension Father   . Heart disease Father   . Hyperlipidemia Father   . Pancreatic cancer Paternal Grandmother        possibly  . Pancreatic cancer Paternal Aunt   . Colon cancer Neg Hx     Social History   Socioeconomic History  . Marital status: Married    Spouse name: Not on file  . Number of children: Not on file  . Years of education: Not on file  . Highest education level: Not on file  Occupational History  . Not on file  Social Needs  . Financial resource strain: Not on file  . Food insecurity    Worry: Not on file    Inability: Not on file  . Transportation needs    Medical: Not on file    Non-medical: Not on file  Tobacco Use  . Smoking status: Current Every Day Smoker    Packs/day: 0.50    Types: Cigarettes  . Smokeless tobacco: Never Used  Substance and Sexual Activity  . Alcohol use: Yes    Comment: rarely, pt stated that he drinks once ever 3 months and it is usually a beer at dinner  . Drug use: No  . Sexual  activity: Yes    Partners: Female  Lifestyle  . Physical activity    Days per week: Not on file    Minutes per session: Not on file  . Stress: Not on file  Relationships  . Social Herbalist on phone: Not on file    Gets together: Not on file    Attends religious service: Not on file    Active member of club or organization: Not on file    Attends meetings of clubs or organizations: Not on file    Relationship status: Not on file  . Intimate partner violence    Fear of current or ex partner: Not on file    Emotionally abused: Not on file    Physically abused: Not on file    Forced  sexual activity: Not on file  Other Topics Concern  . Not on file  Social History Narrative  . Not on file    Current Outpatient Medications:  .  amLODipine-benazepril (LOTREL) 5-20 MG capsule, TAKE 1 CAPSULE BY MOUTH EVERY DAY, Disp: 90 capsule, Rfl: 1 .  metoprolol succinate (TOPROL-XL) 100 MG 24 hr tablet, Take 1 tablet (100 mg total) by mouth daily. Take with or immediately following a meal., Disp: 90 tablet, Rfl: 2 .  ondansetron (ZOFRAN ODT) 8 MG disintegrating tablet, Take 1 tablet (8 mg total) by mouth every 8 (eight) hours as needed for nausea or vomiting., Disp: 20 tablet, Rfl: 0 .  oxyCODONE (ROXICODONE) 15 MG immediate release tablet, Take 1 tablet (15 mg total) by mouth 2 (two) times daily as needed for pain. Can take an extra tab 1-2 times per week.No more than 70 tabs/month, Disp: 70 tablet, Rfl: 0 .  promethazine (PHENERGAN) 25 MG suppository, Place 1 suppository (25 mg total) rectally every 8 (eight) hours as needed for nausea or vomiting., Disp: 12 each, Rfl: 2 .  promethazine (PHENERGAN) 25 MG tablet, Take 1 tablet (25 mg total) by mouth every 12 (twelve) hours as needed., Disp: 45 tablet, Rfl: 1  EXAM:  VITALS per patient if applicable:N/A  GENERAL: alert, oriented, appears well and in no acute distress  HEENT: atraumatic, conjunctiva clear, no obvious abnormalities on inspection.  NECK: normal movements of the head and neck  LUNGS: on inspection no signs of respiratory distress, breathing rate appears normal, no obvious gross SOB, gasping or wheezing  CV: no obvious cyanosis  MS: moves all visible extremities without noticeable abnormality  PSYCH/NEURO: pleasant and cooperative, no obvious depression or anxiety, speech and thought processing grossly intact  ASSESSMENT AND PLAN:  Discussed the following assessment and plan:  Chronic abdominal pain - Plan: oxyCODONE (ROXICODONE) 15 MG immediate release tablet Stable. Hydrocodone did not help as much as  Oxycodone,so back to the latter one same dise he was taking. Side effects discussed. Med contract id current. [place] controlled substance report reviewed.  Chronic midline back pain, unspecified back location - Plan: oxyCODONE (ROXICODONE) 15 MG immediate release tablet Stable. He has not had lumbar or thoracic MRI ordered a few months ago, he needs to re-schedule appt.  Chronic biliary pancreatitis (HCC) - Plan: promethazine (PHENERGAN) 25 MG tablet He has followed with GI.  Lab Results  Component Value Date   LIPASE 37 02/15/2019   Last abdominal CT in 04/2018: Fatty liver and intrahepatic biliary ductal dilation and pneumobilia.  Nausea and vomiting in adult - Plan: promethazine (PHENERGAN) 25 MG tablet ? Gastroparesis. Stable otherwise. Continue current management.    I discussed the  assessment and treatment plan with the patient. He was provided an opportunity to ask questions and all were answered. He agreed with the plan and demonstrated an understanding of the instructions.   In regard to his daughter, recommend psychologic evaluation and screening for dyslexia. Recommend trying a color screening on papers she is reading to see if this help with possible reading disorder.  The patient was advised to call back or seek an in-person evaluation if the symptoms worsen or if the condition fails to improve as anticipated.  No follow-ups on file.    Shantoria Ellwood Martinique, MD

## 2019-03-20 ENCOUNTER — Other Ambulatory Visit: Payer: Self-pay

## 2019-03-20 ENCOUNTER — Encounter (HOSPITAL_BASED_OUTPATIENT_CLINIC_OR_DEPARTMENT_OTHER): Payer: Self-pay | Admitting: Emergency Medicine

## 2019-03-20 ENCOUNTER — Emergency Department (HOSPITAL_BASED_OUTPATIENT_CLINIC_OR_DEPARTMENT_OTHER)
Admission: EM | Admit: 2019-03-20 | Discharge: 2019-03-20 | Disposition: A | Discharge status: Home / Self Care | Payer: BLUE CROSS/BLUE SHIELD | Attending: Emergency Medicine | Admitting: Emergency Medicine

## 2019-03-20 DIAGNOSIS — Z885 Allergy status to narcotic agent status: Secondary | ICD-10-CM | POA: Insufficient documentation

## 2019-03-20 DIAGNOSIS — G8929 Other chronic pain: Secondary | ICD-10-CM | POA: Diagnosis not present

## 2019-03-20 DIAGNOSIS — I10 Essential (primary) hypertension: Secondary | ICD-10-CM | POA: Diagnosis not present

## 2019-03-20 DIAGNOSIS — Z91012 Allergy to eggs: Secondary | ICD-10-CM | POA: Insufficient documentation

## 2019-03-20 DIAGNOSIS — R1012 Left upper quadrant pain: Secondary | ICD-10-CM | POA: Diagnosis not present

## 2019-03-20 DIAGNOSIS — Z888 Allergy status to other drugs, medicaments and biological substances status: Secondary | ICD-10-CM | POA: Insufficient documentation

## 2019-03-20 DIAGNOSIS — F17210 Nicotine dependence, cigarettes, uncomplicated: Secondary | ICD-10-CM | POA: Insufficient documentation

## 2019-03-20 DIAGNOSIS — Z79891 Long term (current) use of opiate analgesic: Secondary | ICD-10-CM | POA: Insufficient documentation

## 2019-03-20 DIAGNOSIS — R112 Nausea with vomiting, unspecified: Secondary | ICD-10-CM | POA: Insufficient documentation

## 2019-03-20 DIAGNOSIS — R109 Unspecified abdominal pain: Secondary | ICD-10-CM

## 2019-03-20 DIAGNOSIS — Z79899 Other long term (current) drug therapy: Secondary | ICD-10-CM | POA: Insufficient documentation

## 2019-03-20 LAB — URINALYSIS, ROUTINE W REFLEX MICROSCOPIC
Bilirubin Urine: NEGATIVE
Glucose, UA: NEGATIVE mg/dL
Hgb urine dipstick: NEGATIVE
Ketones, ur: NEGATIVE mg/dL
Leukocytes,Ua: NEGATIVE
Nitrite: NEGATIVE
Protein, ur: NEGATIVE mg/dL
Specific Gravity, Urine: >1.03 — ABNORMAL HIGH (ref 1.005–1.030)
pH: 5.5 (ref 5.0–8.0)

## 2019-03-20 LAB — CBC WITH DIFFERENTIAL/PLATELET
Abs Immature Granulocytes: 0.06 10*3/uL (ref 0.00–0.07)
Basophils Absolute: 0.1 10*3/uL (ref 0.0–0.1)
Basophils Relative: 1 %
Eosinophils Absolute: 0.2 10*3/uL (ref 0.0–0.5)
Eosinophils Relative: 1 %
HCT: 50.1 % (ref 39.0–52.0)
Hemoglobin: 17.2 g/dL — ABNORMAL HIGH (ref 13.0–17.0)
Immature Granulocytes: 1 %
Lymphocytes Relative: 31 %
Lymphs Abs: 3.8 10*3/uL (ref 0.7–4.0)
MCH: 30.3 pg (ref 26.0–34.0)
MCHC: 34.3 g/dL (ref 30.0–36.0)
MCV: 88.2 fL (ref 80.0–100.0)
Monocytes Absolute: 1.4 10*3/uL — ABNORMAL HIGH (ref 0.1–1.0)
Monocytes Relative: 11 %
Neutro Abs: 6.9 10*3/uL (ref 1.7–7.7)
Neutrophils Relative %: 55 %
Platelets: 279 10*3/uL (ref 150–400)
RBC: 5.68 MIL/uL (ref 4.22–5.81)
RDW: 12.5 % (ref 11.5–15.5)
WBC: 12.4 10*3/uL — ABNORMAL HIGH (ref 4.0–10.5)
nRBC: 0 % (ref 0.0–0.2)

## 2019-03-20 LAB — COMPREHENSIVE METABOLIC PANEL
ALT: 25 U/L (ref 0–44)
AST: 20 U/L (ref 15–41)
Albumin: 4.5 g/dL (ref 3.5–5.0)
Alkaline Phosphatase: 72 U/L (ref 38–126)
Anion gap: 9 (ref 5–15)
BUN: 15 mg/dL (ref 6–20)
CO2: 21 mmol/L — ABNORMAL LOW (ref 22–32)
Calcium: 9.6 mg/dL (ref 8.9–10.3)
Chloride: 109 mmol/L (ref 98–111)
Creatinine, Ser: 0.96 mg/dL (ref 0.61–1.24)
GFR calc Af Amer: >60 mL/min (ref >60)
GFR calc non Af Amer: >60 mL/min (ref >60)
Glucose, Bld: 106 mg/dL — ABNORMAL HIGH (ref 70–99)
Potassium: 3.7 mmol/L (ref 3.5–5.1)
Sodium: 139 mmol/L (ref 135–145)
Total Bilirubin: 0.6 mg/dL (ref 0.3–1.2)
Total Protein: 7.5 g/dL (ref 6.5–8.1)

## 2019-03-20 LAB — LIPASE, BLOOD: Lipase: 46 U/L (ref 11–51)

## 2019-03-20 MED ORDER — ONDANSETRON HCL 4 MG/2ML IJ SOLN
4.0000 mg | Freq: Once | INTRAMUSCULAR | Status: AC
  Administered 2019-03-20: 4 mg via INTRAVENOUS
  Filled 2019-03-20: qty 2

## 2019-03-20 MED ORDER — SODIUM CHLORIDE 0.9 % IV BOLUS
1000.0000 mL | Freq: Once | INTRAVENOUS | Status: AC
  Administered 2019-03-20: 1000 mL via INTRAVENOUS

## 2019-03-20 MED ORDER — HYDROMORPHONE HCL 1 MG/ML IJ SOLN
1.0000 mg | Freq: Once | INTRAMUSCULAR | Status: AC
  Administered 2019-03-20: 1 mg via INTRAVENOUS
  Filled 2019-03-20: qty 1

## 2019-03-20 NOTE — ED Notes (Signed)
ED Provider at bedside. 

## 2019-03-20 NOTE — ED Triage Notes (Signed)
LUQ pain. Pt states feels like his pancreatitis.Emesis times 2.

## 2019-03-20 NOTE — ED Provider Notes (Signed)
[facility] DEPT [facility] Provider Note: Georgena Spurling, MD, FACEP  CSN: [id number] MRN: [id number] ARRIVAL: 03/20/19 at [place]: [place]  Abdominal Pain   HISTORY OF PRESENT ILLNESS  03/20/19 1:03 AM [name] is a 43 y.o. male who is on chronic pain management for chronic pancreatitis.  He is here with left upper quadrant abdominal pain radiating to his back which began about noon yesterday.  His pain is similar to previous exacerbations of his chronic pain. He has had associated nausea and vomiting (two episodes) which has prevented him from keeping his usual oxycodone down.  He rates his pain as an 7 out of 10 and describes it as sharp.  It is constant and worse with movement or palpation.  He has loose stools but no acute change.   Past Medical History:  Diagnosis Date   Chronic abdominal pain    Diverticulitis    Drug-seeking behavior    Hypertension    Kidney stones    Pancreatitis, chronic ([place]) 05/28/2005    Past Surgical History:  Procedure Laterality Date   APPENDECTOMY     CHOLECYSTECTOMY     ERCP W/ METAL STENT PLACEMENT     KIDNEY STONE SURGERY      Family History  Problem Relation Age of Onset   Cancer Mother    Early death Mother    Hypertension Mother    Heart failure Father    Hypertension Father    Heart disease Father    Hyperlipidemia Father    Pancreatic cancer Paternal Grandmother        possibly   Pancreatic cancer Paternal Aunt    Colon cancer Neg Hx     Social History   Tobacco Use   Smoking status: Current Every Day Smoker    Packs/day: 0.50    Types: Cigarettes   Smokeless tobacco: Never Used  Substance Use Topics   Alcohol use: Yes    Comment: rarely, pt stated that he drinks once ever 3 months and it is usually a beer at dinner   Drug use: No    Prior to Admission medications   Medication Sig Start Date End Date Taking? Authorizing Provider  amLODipine-benazepril  (LOTREL) 5-20 MG capsule TAKE 1 CAPSULE BY MOUTH EVERY DAY 09/26/18   Martin[place], Betty G, MD  metoprolol succinate (TOPROL-XL) 100 MG 24 hr tablet Take 1 tablet (100 mg total) by mouth daily. Take with or immediately following a meal. 12/27/18   [place], Betty G, MD  ondansetron (ZOFRAN ODT) 8 MG disintegrating tablet Take 1 tablet (8 mg total) by mouth every 8 (eight) hours as needed for nausea or vomiting. 02/14/19   Katarina Riebe, Jenny Reichmann, MD  oxyCODONE (ROXICODONE) 15 MG immediate release tablet Take 1 tablet (15 mg total) by mouth 2 (two) times daily as needed for pain. Can take an extra tab 1-2 times per week.No more than 70 tabs/month 03/04/19   Martin[place], Betty G, MD  promethazine (PHENERGAN) 25 MG suppository Place 1 suppository (25 mg total) rectally every 8 (eight) hours as needed for nausea or vomiting. 02/20/19   Martin[place], Betty G, MD  promethazine (PHENERGAN) 25 MG tablet Take 1 tablet (25 mg total) by mouth every 12 (twelve) hours as needed. 03/04/19   Martin[place], Betty G, MD    Allergies Cortisone, Eggs or egg-derived products, Ketorolac tromethamine, Haloperidol, Hydrocortisone, Iodinated diagnostic agents, Ketorolac tromethamine, Haldol [haloperidol lactate], Ketorolac, and Reglan [metoclopramide]   REVIEW OF SYSTEMS  Negative except as noted  here or in the History of Present Illness.   PHYSICAL EXAMINATION  Initial Vital Signs Pulse 88, temperature 98 F (36.7 C), temperature source Oral, resp. rate 16, height 6' (1.829 m), weight 93 kg, SpO2 99 %.  Examination General: Well-developed, well-nourished male in no acute distress; appearance consistent with age of record HENT: normocephalic; atraumatic Eyes: pupils equal, round and reactive to light; extraocular muscles intact Neck: supple Heart: regular rate and rhythm Lungs: clear to auscultation bilaterally Abdomen: soft; nondistended; left upper quadrant tenderness; bowel sounds present Extremities: No deformity; full range of motion; pulses  normal Neurologic: Awake, alert and oriented; motor function intact in all extremities and symmetric; no facial droop Skin: Warm and dry Psychiatric: Flat affect   RESULTS  Summary of this visit's results, reviewed and interpreted by myself:   EKG Interpretation  Date/Time:    Ventricular Rate:    PR Interval:    QRS Duration:   QT Interval:    QTC Calculation:   R Axis:     Text Interpretation:        Laboratory Studies: Results for orders placed or performed during the hospital encounter of 03/20/19 (from the past 24 hour(s))  CBC with Differential/Platelet     Status: Abnormal   Collection Time: 03/20/19  1:10 AM  Result Value Ref Range   WBC 12.4 (H) 4.0 - 10.5 K/uL   RBC 5.68 4.22 - 5.81 MIL/uL   Hemoglobin 17.2 (H) 13.0 - 17.0 g/dL   HCT 50.1 39.0 - 52.0 %   MCV 88.2 80.0 - 100.0 fL   MCH 30.3 26.0 - 34.0 pg   MCHC 34.3 30.0 - 36.0 g/dL   RDW 12.5 11.5 - 15.5 %   Platelets 279 150 - 400 K/uL   nRBC 0.0 0.0 - 0.2 %   Neutrophils Relative % 55 %   Neutro Abs 6.9 1.7 - 7.7 K/uL   Lymphocytes Relative 31 %   Lymphs Abs 3.8 0.7 - 4.0 K/uL   Monocytes Relative 11 %   Monocytes Absolute 1.4 (H) 0.1 - 1.0 K/uL   Eosinophils Relative 1 %   Eosinophils Absolute 0.2 0.0 - 0.5 K/uL   Basophils Relative 1 %   Basophils Absolute 0.1 0.0 - 0.1 K/uL   Immature Granulocytes 1 %   Abs Immature Granulocytes 0.06 0.00 - 0.07 K/uL  Comprehensive metabolic panel     Status: Abnormal   Collection Time: 03/20/19  1:10 AM  Result Value Ref Range   Sodium 139 135 - 145 mmol/L   Potassium 3.7 3.5 - 5.1 mmol/L   Chloride 109 98 - 111 mmol/L   CO2 21 (L) 22 - 32 mmol/L   Glucose, Bld 106 (H) 70 - 99 mg/dL   BUN 15 6 - 20 mg/dL   Creatinine, Ser 0.96 0.61 - 1.24 mg/dL   Calcium 9.6 8.9 - 10.3 mg/dL   Total Protein 7.5 6.5 - 8.1 g/dL   Albumin 4.5 3.5 - 5.0 g/dL   AST 20 15 - 41 U/L   ALT 25 0 - 44 U/L   Alkaline Phosphatase 72 38 - 126 U/L   Total Bilirubin 0.6 0.3 - 1.2  mg/dL   GFR calc non Af Amer >60 >60 mL/min   GFR calc Af Amer >60 >60 mL/min   Anion gap 9 5 - 15  Lipase, blood     Status: None   Collection Time: 03/20/19  1:10 AM  Result Value Ref Range   Lipase 46 11 -  51 U/L  Urinalysis, Routine w reflex microscopic     Status: Abnormal   Collection Time: 03/20/19  1:20 AM  Result Value Ref Range   Color, Urine YELLOW YELLOW   APPearance CLEAR CLEAR   Specific Gravity, Urine >1.030 (H) 1.005 - 1.030   pH 5.5 5.0 - 8.0   Glucose, UA NEGATIVE NEGATIVE mg/dL   Hgb urine dipstick NEGATIVE NEGATIVE   Bilirubin Urine NEGATIVE NEGATIVE   Ketones, ur NEGATIVE NEGATIVE mg/dL   Protein, ur NEGATIVE NEGATIVE mg/dL   Nitrite NEGATIVE NEGATIVE   Leukocytes,Ua NEGATIVE NEGATIVE   Imaging Studies: No results found.  ED COURSE and MDM  Nursing notes, initial and subsequent vitals signs, including pulse oximetry, reviewed and interpreted by myself.  Vitals:   03/20/19 0057 03/20/19 0150 03/20/19 0254  BP:  127/85 140/89  Pulse: 88 65 72  Resp: 16 16 18   Temp: 98 F (36.7 C)    TempSrc: Oral    SpO2: 99% 97% 99%  Weight: 93 kg    Height: 6' (1.829 m)     Medications  sodium chloride 0.9 % bolus 1,000 mL ( Intravenous Stopped 03/20/19 0220)  ondansetron (ZOFRAN) injection 4 mg (4 mg Intravenous Given 03/20/19 0114)  HYDROmorphone (DILAUDID) injection 1 mg (1 mg Intravenous Given 03/20/19 0114)  HYDROmorphone (DILAUDID) injection 1 mg (1 mg Intravenous Given 03/20/19 0200)  HYDROmorphone (DILAUDID) injection 1 mg (1 mg Intravenous Given 03/20/19 0252)   3:07 AM Patient feeling better after IV medications and fluids and is ready to go home.  He understands he will not be getting any additional narcotic prescriptions for home use as he is under pain management.  PROCEDURES  Procedures   ED DIAGNOSES     ICD-10-CM   1. Chronic abdominal pain  R10.9    G89.29        Mikayela Deats, Jenny Reichmann, MD 03/20/19 [phone]

## 2019-03-21 ENCOUNTER — Other Ambulatory Visit: Payer: Self-pay

## 2019-03-21 ENCOUNTER — Encounter (HOSPITAL_BASED_OUTPATIENT_CLINIC_OR_DEPARTMENT_OTHER): Payer: Self-pay | Admitting: Emergency Medicine

## 2019-03-21 ENCOUNTER — Emergency Department (HOSPITAL_BASED_OUTPATIENT_CLINIC_OR_DEPARTMENT_OTHER): Payer: BLUE CROSS/BLUE SHIELD

## 2019-03-21 ENCOUNTER — Emergency Department (HOSPITAL_BASED_OUTPATIENT_CLINIC_OR_DEPARTMENT_OTHER)
Admission: EM | Admit: 2019-03-21 | Discharge: 2019-03-21 | Disposition: A | Discharge status: Home / Self Care | Payer: BLUE CROSS/BLUE SHIELD | Attending: Emergency Medicine | Admitting: Emergency Medicine

## 2019-03-21 DIAGNOSIS — I10 Essential (primary) hypertension: Secondary | ICD-10-CM | POA: Insufficient documentation

## 2019-03-21 DIAGNOSIS — R1111 Vomiting without nausea: Secondary | ICD-10-CM | POA: Diagnosis not present

## 2019-03-21 DIAGNOSIS — R1013 Epigastric pain: Secondary | ICD-10-CM

## 2019-03-21 DIAGNOSIS — R110 Nausea: Secondary | ICD-10-CM | POA: Diagnosis not present

## 2019-03-21 DIAGNOSIS — F17210 Nicotine dependence, cigarettes, uncomplicated: Secondary | ICD-10-CM | POA: Diagnosis not present

## 2019-03-21 LAB — CBC
HCT: 48.9 % (ref 39.0–52.0)
Hemoglobin: 16.6 g/dL (ref 13.0–17.0)
MCH: 30 pg (ref 26.0–34.0)
MCHC: 33.9 g/dL (ref 30.0–36.0)
MCV: 88.4 fL (ref 80.0–100.0)
Platelets: 262 10*3/uL (ref 150–400)
RBC: 5.53 MIL/uL (ref 4.22–5.81)
RDW: 12.2 % (ref 11.5–15.5)
WBC: 11.6 10*3/uL — ABNORMAL HIGH (ref 4.0–10.5)
nRBC: 0 % (ref 0.0–0.2)

## 2019-03-21 LAB — LIPASE, BLOOD: Lipase: 39 U/L (ref 11–51)

## 2019-03-21 LAB — URINALYSIS, ROUTINE W REFLEX MICROSCOPIC
Bilirubin Urine: NEGATIVE
Glucose, UA: NEGATIVE mg/dL
Hgb urine dipstick: NEGATIVE
Ketones, ur: NEGATIVE mg/dL
Leukocytes,Ua: NEGATIVE
Nitrite: NEGATIVE
Protein, ur: NEGATIVE mg/dL
Specific Gravity, Urine: 1.02 (ref 1.005–1.030)
pH: 5.5 (ref 5.0–8.0)

## 2019-03-21 LAB — COMPREHENSIVE METABOLIC PANEL
ALT: 24 U/L (ref 0–44)
AST: 18 U/L (ref 15–41)
Albumin: 4.6 g/dL (ref 3.5–5.0)
Alkaline Phosphatase: 73 U/L (ref 38–126)
Anion gap: 11 (ref 5–15)
BUN: 15 mg/dL (ref 6–20)
CO2: 21 mmol/L — ABNORMAL LOW (ref 22–32)
Calcium: 9.3 mg/dL (ref 8.9–10.3)
Chloride: 107 mmol/L (ref 98–111)
Creatinine, Ser: 0.85 mg/dL (ref 0.61–1.24)
GFR calc Af Amer: >60 mL/min (ref >60)
GFR calc non Af Amer: >60 mL/min (ref >60)
Glucose, Bld: 100 mg/dL — ABNORMAL HIGH (ref 70–99)
Potassium: 3.2 mmol/L — ABNORMAL LOW (ref 3.5–5.1)
Sodium: 139 mmol/L (ref 135–145)
Total Bilirubin: 0.9 mg/dL (ref 0.3–1.2)
Total Protein: 7.4 g/dL (ref 6.5–8.1)

## 2019-03-21 LAB — MAGNESIUM: Magnesium: 2.2 mg/dL (ref 1.7–2.4)

## 2019-03-21 MED ORDER — ONDANSETRON HCL 4 MG/2ML IJ SOLN
4.0000 mg | Freq: Once | INTRAMUSCULAR | Status: AC
  Administered 2019-03-21: 4 mg via INTRAVENOUS
  Filled 2019-03-21: qty 2

## 2019-03-21 MED ORDER — HYDROMORPHONE HCL 1 MG/ML IJ SOLN
1.0000 mg | Freq: Once | INTRAMUSCULAR | Status: AC
  Administered 2019-03-21: 1 mg via INTRAVENOUS
  Filled 2019-03-21: qty 1

## 2019-03-21 MED ORDER — SODIUM CHLORIDE 0.9 % IV BOLUS
1000.0000 mL | Freq: Once | INTRAVENOUS | Status: AC
  Administered 2019-03-21: 1000 mL via INTRAVENOUS

## 2019-03-21 MED ORDER — POTASSIUM CHLORIDE 10 MEQ/100ML IV SOLN
10.0000 meq | INTRAVENOUS | Status: AC
  Administered 2019-03-21 (×2): 10 meq via INTRAVENOUS
  Filled 2019-03-21 (×2): qty 100

## 2019-03-21 NOTE — ED Triage Notes (Signed)
LUQ pain for several days. Seen yesterday and was feeling better, pain worsened at 3am. History of chronic pancreatitis. Endorses N/v

## 2019-03-21 NOTE — Discharge Instructions (Signed)

## 2019-03-21 NOTE — ED Provider Notes (Signed)
Emergency Department Provider Note   I have reviewed the triage vital signs and the nursing notes.   HISTORY  Chief Complaint Abdominal Pain   HPI [name] is a 43 y.o. male with PMH of HTN and chronic abdominal pain presents to the emergency department for evaluation of worsening abdominal pain.  He describes pain in the epigastric and left upper quadrant regions similar to his prior abdominal pain episodes.  He follows with his family medicine doctor and gastroenterologist regularly.  He reports frequent episodes of nonbloody vomiting.  He denies diarrhea.  No fevers.  No chest pain or shortness of breath symptoms.  He was seen in the emergency department on 10/23 with similar symptoms.  He was treated with pain medications and IV fluids but ultimately felt well enough to return home.  Patient denies any known trigger but states that his symptoms have returned.     Past Medical History:  Diagnosis Date  . Chronic abdominal pain   . Diverticulitis   . Drug-seeking behavior   . Hypertension   . Kidney stones   . Pancreatitis, chronic ([place]) 05/28/2005    Patient Active Problem List   Diagnosis Date Noted  . Chronic pain disorder 08/29/2018  . Back pain, chronic 08/29/2018  . Nausea and vomiting in adult 02/25/2018  . Hypertension, essential, benign 01/18/2018  . Chronic biliary pancreatitis ([place]) 01/18/2018  . Chronic abdominal pain 01/14/2018    Past Surgical History:  Procedure Laterality Date  . APPENDECTOMY    . CHOLECYSTECTOMY    . ERCP W/ METAL STENT PLACEMENT    . KIDNEY STONE SURGERY      Allergies Cortisone, Eggs or egg-derived products, Ketorolac tromethamine, Haloperidol, Hydrocortisone, Iodinated diagnostic agents, Ketorolac tromethamine, Haldol [haloperidol lactate], Ketorolac, and Reglan [metoclopramide]  Family History  Problem Relation Age of Onset  . Cancer Mother   . Early death Mother   . Hypertension Mother   . Heart failure Father    . Hypertension Father   . Heart disease Father   . Hyperlipidemia Father   . Pancreatic cancer Paternal Grandmother        possibly  . Pancreatic cancer Paternal Aunt   . Colon cancer Neg Hx     Social History Social History   Tobacco Use  . Smoking status: Current Every Day Smoker    Packs/day: 0.50    Types: Cigarettes  . Smokeless tobacco: Never Used  Substance Use Topics  . Alcohol use: Yes    Comment: rarely, pt stated that he drinks once ever 3 months and it is usually a beer at dinner  . Drug use: No    Review of Systems  Constitutional: No fever/chills Eyes: No visual changes. ENT: No sore throat. Cardiovascular: Denies chest pain. Respiratory: Denies shortness of breath. Gastrointestinal: Positive epigastric and LUQ abdominal pain. Positive nausea and vomiting.  No diarrhea.  No constipation. Genitourinary: Negative for dysuria. Musculoskeletal: Negative for back pain. Skin: Negative for rash. Neurological: Negative for headaches, focal weakness or numbness.  10-point ROS otherwise negative.  ____________________________________________   PHYSICAL EXAM:  VITAL SIGNS: ED Triage Vitals  Enc Vitals Group     BP 03/21/19 1411 (!) 149/102     Pulse Rate 03/21/19 1411 71     Resp 03/21/19 1411 18     Temp 03/21/19 1411 98.7 F (37.1 C)     Temp Source 03/21/19 1411 Oral     SpO2 03/21/19 1411 99 %     Weight 03/21/19  1410 205 lb (93 kg)     Height 03/21/19 1410 6\' 1"  (1.854 m)   Constitutional: Alert and oriented. Well appearing and in no acute distress. Eyes: Conjunctivae are normal.  Head: Atraumatic. Nose: No congestion/rhinnorhea. Mouth/Throat: Mucous membranes are moist.  Neck: No stridor.   Cardiovascular: Normal rate, regular rhythm. Good peripheral circulation. Grossly normal heart sounds.   Respiratory: Normal respiratory effort.  No retractions. Lungs CTAB. Gastrointestinal: Soft with moderate tenderness in the epigastric region with more  focal discomfort in the LUQ. No distention.  Musculoskeletal: No lower extremity tenderness nor edema. No gross deformities of extremities. Neurologic:  Normal speech and language. Skin:  Skin is warm, dry and intact. No rash noted.   ____________________________________________   LABS (all labs ordered are listed, but only abnormal results are displayed)  Labs Reviewed  COMPREHENSIVE METABOLIC PANEL - Abnormal; Notable for the following components:      Result Value   Potassium 3.2 (*)    CO2 21 (*)    Glucose, Bld 100 (*)    All other components within normal limits  CBC - Abnormal; Notable for the following components:   WBC 11.6 (*)    All other components within normal limits  LIPASE, BLOOD  URINALYSIS, ROUTINE W REFLEX MICROSCOPIC  MAGNESIUM   ____________________________________________  RADIOLOGY  Ct Abdomen Pelvis Wo Contrast  Result Date: 03/21/2019 CLINICAL DATA:  Left upper quadrant abdominal pain, nausea and vomiting for several days. History of chronic pancreatitis. EXAM: CT ABDOMEN AND PELVIS WITHOUT CONTRAST TECHNIQUE: Multidetector CT imaging of the abdomen and pelvis was performed following the standard protocol without IV contrast. COMPARISON:  04/28/2018 CT abdomen/pelvis. FINDINGS: Lower chest: No significant pulmonary nodules or acute consolidative airspace disease. Hepatobiliary: Normal liver size. No liver mass. Cholecystectomy. Bile ducts are stable in caliber and within normal post cholecystectomy limits with CBD diameter 8 mm. Pneumobilia throughout the intrahepatic bile ducts, which was present on prior CT. Pancreas: Normal, with no mass or duct dilation. Spleen: Normal size. No mass. Adrenals/Urinary Tract: Normal adrenals. No renal stones. No hydronephrosis. No contour deforming renal masses. Normal nondistended bladder. Stomach/Bowel: Normal non-distended stomach. Normal caliber small bowel with no small bowel wall thickening. Normal appendix. Normal  large bowel with no diverticulosis, large bowel wall thickening or pericolonic fat stranding. Vascular/Lymphatic: Atherosclerotic nonaneurysmal abdominal aorta. No pathologically enlarged lymph nodes in the abdomen or pelvis. Reproductive: Top-normal size prostate with nonspecific coarse internal prostatic calcifications. Other: No pneumoperitoneum, ascites or focal fluid collection. Musculoskeletal: No aggressive appearing focal osseous lesions. Mild thoracolumbar spondylosis. IMPRESSION: No acute abnormality. No evidence of bowel obstruction or acute bowel inflammation. Normal appendix. Bile ducts are stable and within normal post cholecystectomy limits. Pneumobilia, as on prior CT, presumably due to prior sphincterotomy. Aortic Atherosclerosis (ICD10-I70.0). Electronically Signed   By: Ilona Sorrel M.D.   On: 03/21/2019 17:18    ____________________________________________   PROCEDURES  Procedure(s) performed:   Procedures  None  ____________________________________________   INITIAL IMPRESSION / ASSESSMENT AND PLAN / ED COURSE  Pertinent labs & imaging results that were available during my care of the patient were reviewed by me and considered in my medical decision making (see chart for details).   Patient presents to the emergency department with chronic abdominal pain worsening despite recent ED treatment in the last 24 hours.  Patient does have focal tenderness on exam.  His lab work shows mild leukocytosis with mild hypokalemia.  Kidney function is normal.  LFTs and lipase are within normal limits.  Given his short and overall return to the emergency department plan for CT abdomen pelvis and additional IV fluids.  Patient with report of chronic pancreatitis which may cause his lipase to be normal in the setting but will investigate further with CT.  CT unremarkable. Labs reassuring. Patient feeling improved in the ED once again after pain meds and IVF. Stable for discharge.   ____________________________________________  FINAL CLINICAL IMPRESSION(S) / ED DIAGNOSES  Final diagnoses:  Epigastric pain  Nausea    MEDICATIONS GIVEN DURING THIS VISIT:  Medications  sodium chloride 0.9 % bolus 1,000 mL (0 mLs Intravenous Stopped 03/21/19 1828)  ondansetron (ZOFRAN) injection 4 mg (4 mg Intravenous Given 03/21/19 1622)  HYDROmorphone (DILAUDID) injection 1 mg (1 mg Intravenous Given 03/21/19 1623)  potassium chloride 10 mEq in 100 mL IVPB (0 mEq Intravenous Stopped 03/21/19 1831)  HYDROmorphone (DILAUDID) injection 1 mg (1 mg Intravenous Given 03/21/19 1826)    Note:  This document was prepared using Dragon voice recognition software and may include unintentional dictation errors.  Nanda Quinton, MD, [facility] Emergency Medicine    Long, Wonda Old[facility], MD 03/21/19 [phone]

## 2019-03-21 NOTE — ED Notes (Signed)
Taken to CT at this time. 

## 2019-03-23 ENCOUNTER — Encounter: Payer: Self-pay | Admitting: Family Medicine

## 2019-03-23 ENCOUNTER — Other Ambulatory Visit: Payer: Self-pay

## 2019-03-23 ENCOUNTER — Telehealth: Payer: Self-pay | Admitting: Family Medicine

## 2019-03-23 ENCOUNTER — Encounter (HOSPITAL_BASED_OUTPATIENT_CLINIC_OR_DEPARTMENT_OTHER): Payer: Self-pay | Admitting: Emergency Medicine

## 2019-03-23 ENCOUNTER — Telehealth (INDEPENDENT_AMBULATORY_CARE_PROVIDER_SITE_OTHER): Payer: BLUE CROSS/BLUE SHIELD | Admitting: Family Medicine

## 2019-03-23 ENCOUNTER — Emergency Department (HOSPITAL_BASED_OUTPATIENT_CLINIC_OR_DEPARTMENT_OTHER)
Admission: EM | Admit: 2019-03-23 | Discharge: 2019-03-23 | Disposition: A | Discharge status: Home / Self Care | Payer: BLUE CROSS/BLUE SHIELD | Attending: Emergency Medicine | Admitting: Emergency Medicine

## 2019-03-23 DIAGNOSIS — F1190 Opioid use, unspecified, uncomplicated: Secondary | ICD-10-CM | POA: Diagnosis not present

## 2019-03-23 DIAGNOSIS — R1013 Epigastric pain: Secondary | ICD-10-CM | POA: Insufficient documentation

## 2019-03-23 DIAGNOSIS — R109 Unspecified abdominal pain: Secondary | ICD-10-CM

## 2019-03-23 DIAGNOSIS — F17210 Nicotine dependence, cigarettes, uncomplicated: Secondary | ICD-10-CM | POA: Diagnosis not present

## 2019-03-23 DIAGNOSIS — G8929 Other chronic pain: Secondary | ICD-10-CM

## 2019-03-23 DIAGNOSIS — Z79899 Other long term (current) drug therapy: Secondary | ICD-10-CM | POA: Insufficient documentation

## 2019-03-23 DIAGNOSIS — I10 Essential (primary) hypertension: Secondary | ICD-10-CM | POA: Diagnosis not present

## 2019-03-23 DIAGNOSIS — G894 Chronic pain syndrome: Secondary | ICD-10-CM

## 2019-03-23 DIAGNOSIS — R112 Nausea with vomiting, unspecified: Secondary | ICD-10-CM | POA: Diagnosis not present

## 2019-03-23 LAB — COMPREHENSIVE METABOLIC PANEL
ALT: 22 U/L (ref 0–44)
AST: 18 U/L (ref 15–41)
Albumin: 4.6 g/dL (ref 3.5–5.0)
Alkaline Phosphatase: 77 U/L (ref 38–126)
Anion gap: 11 (ref 5–15)
BUN: 13 mg/dL (ref 6–20)
CO2: 21 mmol/L — ABNORMAL LOW (ref 22–32)
Calcium: 9.3 mg/dL (ref 8.9–10.3)
Chloride: 107 mmol/L (ref 98–111)
Creatinine, Ser: 0.94 mg/dL (ref 0.61–1.24)
GFR calc Af Amer: >60 mL/min (ref >60)
GFR calc non Af Amer: >60 mL/min (ref >60)
Glucose, Bld: 108 mg/dL — ABNORMAL HIGH (ref 70–99)
Potassium: 3.4 mmol/L — ABNORMAL LOW (ref 3.5–5.1)
Sodium: 139 mmol/L (ref 135–145)
Total Bilirubin: 0.7 mg/dL (ref 0.3–1.2)
Total Protein: 7.7 g/dL (ref 6.5–8.1)

## 2019-03-23 LAB — CBC WITH DIFFERENTIAL/PLATELET
Abs Immature Granulocytes: 0.04 10*3/uL (ref 0.00–0.07)
Basophils Absolute: 0.1 10*3/uL (ref 0.0–0.1)
Basophils Relative: 1 %
Eosinophils Absolute: 0.1 10*3/uL (ref 0.0–0.5)
Eosinophils Relative: 1 %
HCT: 49 % (ref 39.0–52.0)
Hemoglobin: 17.1 g/dL — ABNORMAL HIGH (ref 13.0–17.0)
Immature Granulocytes: 0 %
Lymphocytes Relative: 27 %
Lymphs Abs: 2.8 10*3/uL (ref 0.7–4.0)
MCH: 30 pg (ref 26.0–34.0)
MCHC: 34.9 g/dL (ref 30.0–36.0)
MCV: 86 fL (ref 80.0–100.0)
Monocytes Absolute: 0.8 10*3/uL (ref 0.1–1.0)
Monocytes Relative: 8 %
Neutro Abs: 6.6 10*3/uL (ref 1.7–7.7)
Neutrophils Relative %: 63 %
Platelets: 284 10*3/uL (ref 150–400)
RBC: 5.7 MIL/uL (ref 4.22–5.81)
RDW: 12.2 % (ref 11.5–15.5)
WBC: 10.5 10*3/uL (ref 4.0–10.5)
nRBC: 0 % (ref 0.0–0.2)

## 2019-03-23 LAB — LIPASE, BLOOD: Lipase: 36 U/L (ref 11–51)

## 2019-03-23 MED ORDER — SUCRALFATE 1 G PO TABS
1.0000 g | ORAL_TABLET | Freq: Once | ORAL | Status: DC
  Filled 2019-03-23: qty 1

## 2019-03-23 MED ORDER — SUCRALFATE 1 G PO TABS: 1.0000 g | ORAL_TABLET | Freq: Three times a day (TID) | ORAL | 0 refills | Status: DC

## 2019-03-23 MED ORDER — SUCRALFATE 1 GM/10ML PO SUSP
ORAL | Status: AC
  Administered 2019-03-23: 1 g
  Filled 2019-03-23: qty 10

## 2019-03-23 MED ORDER — OXYCODONE HCL ER 10 MG PO T12A: 10.0000 mg | EXTENDED_RELEASE_TABLET | Freq: Two times a day (BID) | ORAL | 0 refills | Status: DC

## 2019-03-23 MED ORDER — SODIUM CHLORIDE 0.9 % IV BOLUS
1000.0000 mL | Freq: Once | INTRAVENOUS | Status: AC
  Administered 2019-03-23: 1000 mL via INTRAVENOUS

## 2019-03-23 MED ORDER — NALOXONE HCL 4 MG/0.1ML NA LIQD: NASAL | 1 refills | Status: DC

## 2019-03-23 MED ORDER — DROPERIDOL 2.5 MG/ML IJ SOLN
1.2500 mg | Freq: Once | INTRAMUSCULAR | Status: AC
  Administered 2019-03-23: 1.25 mg via INTRAVENOUS
  Filled 2019-03-23: qty 2

## 2019-03-23 MED ORDER — ONDANSETRON HCL 4 MG/2ML IJ SOLN
4.0000 mg | Freq: Once | INTRAMUSCULAR | Status: AC
  Administered 2019-03-23: 4 mg via INTRAVENOUS
  Filled 2019-03-23: qty 2

## 2019-03-23 MED ORDER — OMEPRAZOLE 20 MG PO CPDR: 20.0000 mg | DELAYED_RELEASE_CAPSULE | Freq: Every day | ORAL | 0 refills | Status: DC

## 2019-03-23 NOTE — ED Triage Notes (Signed)
Pt returns for continued abd pain on L side that he attributes to chronic pancreatis. Pain radiates to mid abd and into back. Home dose of oxycodone not effective. 3 episodes of vomiting today, nausea. No diarrhea.

## 2019-03-23 NOTE — Telephone Encounter (Signed)
Dr Martinique resent the Rx to [facility], pt is aware

## 2019-03-23 NOTE — ED Provider Notes (Signed)
[facility] Provider Note   CSN: [id number] Arrival date & time: 03/23/19  0154     History   Chief Complaint Chief Complaint  Patient presents with   Abdominal Pain   Emesis    HPI [name] is a 43 y.o. male.     HPI  This is a 43 year old male with history of chronic abdominal pain, diverticulitis, hypertension, chronic pancreatitis who presents with abdominal pain.  Patient was seen and evaluated for the same on Saturday.  That time he had lab work and a CT scan.  CT was negative and lab work-up was reassuring.  He received fluids and pain medication.  He returns tonight with continued pain.  He reports he is taking his oxycodone and Phenergan at home but woke up this evening with worsening pain.  He reports nausea and vomiting.  Normal bowel movements.  No fevers.  Denies urinary symptoms.  Reports that his pain is epigastric and radiates to his back.  He rates his pain at 10 out of 10.  I have reviewed the patient's chart.  He is on oxycodone and is under a pain management agreement.  He has a history of chronic pain syndrome and chronic abdominal pain.  He is followed by his family doctor and gastroenterology.  Past Medical History:  Diagnosis Date   Chronic abdominal pain    Diverticulitis    Drug-seeking behavior    Hypertension    Kidney stones    Pancreatitis, chronic ([place]) 05/28/2005    Patient Active Problem List   Diagnosis Date Noted   Chronic pain disorder 08/29/2018   Back pain, chronic 08/29/2018   Nausea and vomiting in adult 02/25/2018   Hypertension, essential, benign 01/18/2018   Chronic biliary pancreatitis ([place]) 01/18/2018   Chronic abdominal pain 01/14/2018    Past Surgical History:  Procedure Laterality Date   APPENDECTOMY     CHOLECYSTECTOMY     ERCP W/ METAL [facility] Medications    Prior to Admission medications   Medication  Sig Start Date End Date Taking? Authorizing Provider  amLODipine-benazepril (LOTREL) 5-20 MG capsule TAKE 1 CAPSULE BY MOUTH EVERY DAY 09/26/18   Martinique, Betty G, MD  metoprolol succinate (TOPROL-XL) 100 MG 24 hr tablet Take 1 tablet (100 mg total) by mouth daily. Take with or immediately following a meal. 12/27/18   Martin[place], Betty G, MD  omeprazole (PRILOSEC) 20 MG capsule Take 1 capsule (20 mg total) by mouth daily. 03/23/19   Jadrien Narine, Barbette Hair, MD  ondansetron (ZOFRAN ODT) 8 MG disintegrating tablet Take 1 tablet (8 mg total) by mouth every 8 (eight) hours as needed for nausea or vomiting. 02/14/19   Molpus, Jenny Reichmann, MD  oxyCODONE (ROXICODONE) 15 MG immediate release tablet Take 1 tablet (15 mg total) by mouth 2 (two) times daily as needed for pain. Can take an extra tab 1-2 times per week.No more than 70 tabs/month 03/04/19   Martin[place], Betty G, MD  promethazine (PHENERGAN) 25 MG suppository Place 1 suppository (25 mg total) rectally every 8 (eight) hours as needed for nausea or vomiting. 02/20/19   Martin[place], Betty G, MD  promethazine (PHENERGAN) 25 MG tablet Take 1 tablet (25 mg total) by mouth every 12 (twelve) hours as needed. 03/04/19   [place], Betty G, MD  sucralfate (CARAFATE) 1 g tablet Take 1 tablet (1 g total) by mouth 4 (four) times  daily -  with meals and at bedtime. 03/23/19   Steven Basso, Barbette Hair, MD    Family History Family History  Problem Relation Age of Onset   Cancer Mother    Early death Mother    Hypertension Mother    Heart failure Father    Hypertension Father    Heart disease Father    Hyperlipidemia Father    Pancreatic cancer Paternal Grandmother        possibly   Pancreatic cancer Paternal Aunt    Colon cancer Neg Hx     Social History Social History   Tobacco Use   Smoking status: Current Every Day Smoker    Packs/day: 0.50    Types: Cigarettes   Smokeless tobacco: Never Used  Substance Use Topics   Alcohol use: Yes    Comment: rarely, pt stated that he  drinks once ever 3 months and it is usually a beer at dinner   Drug use: No     Allergies   Cortisone, Eggs or egg-derived products, Ketorolac tromethamine, Haloperidol, Hydrocortisone, Iodinated diagnostic agents, Ketorolac tromethamine, Haldol [haloperidol lactate], Ketorolac, and Reglan [metoclopramide]   Review of Systems Review of Systems  Constitutional: Negative for fever.  Respiratory: Negative for shortness of breath.   Cardiovascular: Negative for chest pain.  Gastrointestinal: Positive for abdominal pain, nausea and vomiting. Negative for constipation and diarrhea.  Genitourinary: Negative for dysuria.  All other systems reviewed and are negative.    Physical Exam Updated Vital Signs BP (!) 134/95 (BP Location: Right Arm)    Pulse 79    Temp 98.7 F (37.1 C) (Oral)    Resp (!) 22    Ht 1.854 m (6\' 1" )    Wt 93 kg    SpO2 99%    BMI 27.05 kg/m   Physical Exam Vitals signs and nursing note reviewed.  Constitutional:      Appearance: He is well-developed. He is not ill-appearing.  HENT:     Head: Normocephalic and atraumatic.  Eyes:     Pupils: Pupils are equal, round, and reactive to light.  Neck:     Musculoskeletal: Neck supple.  Cardiovascular:     Rate and Rhythm: Normal rate and regular rhythm.     Heart sounds: Normal heart sounds. No murmur.  Pulmonary:     Effort: Pulmonary effort is normal. No respiratory distress.     Breath sounds: Normal breath sounds. No wheezing.  Abdominal:     General: Bowel sounds are normal.     Palpations: Abdomen is soft.     Tenderness: There is abdominal tenderness in the epigastric area. There is no guarding or rebound.  Lymphadenopathy:     Cervical: No cervical adenopathy.  Skin:    General: Skin is warm and dry.  Neurological:     Mental Status: He is alert and oriented to person, place, and time.  Psychiatric:        Mood and Affect: Mood is anxious.      ED Treatments / Results  Labs (all labs ordered  are listed, but only abnormal results are displayed) Labs Reviewed  CBC WITH DIFFERENTIAL/PLATELET - Abnormal; Notable for the following components:      Result Value   Hemoglobin 17.1 (*)    All other components within normal limits  COMPREHENSIVE METABOLIC PANEL - Abnormal; Notable for the following components:   Potassium 3.4 (*)    CO2 21 (*)    Glucose, Bld 108 (*)    All other  components within normal limits  LIPASE, BLOOD    EKG EKG Interpretation  Date/Time:  Monday March 23 2019 03:08:09 EDT Ventricular Rate:  69 PR Interval:    QRS Duration: 97 QT Interval:  425 QTC Calculation: 456 R Axis:   36 Text Interpretation:  Sinus rhythm Low voltage, precordial leads Confirmed by Thayer Jew [phone]) on 03/23/2019 3:50:46 AM   Radiology Ct Abdomen Pelvis Wo Contrast  Result Date: 03/21/2019 CLINICAL DATA:  Left upper quadrant abdominal pain, nausea and vomiting for several days. History of chronic pancreatitis. EXAM: CT ABDOMEN AND PELVIS WITHOUT CONTRAST TECHNIQUE: Multidetector CT imaging of the abdomen and pelvis was performed following the standard protocol without IV contrast. COMPARISON:  04/28/2018 CT abdomen/pelvis. FINDINGS: Lower chest: No significant pulmonary nodules or acute consolidative airspace disease. Hepatobiliary: Normal liver size. No liver mass. Cholecystectomy. Bile ducts are stable in caliber and within normal post cholecystectomy limits with CBD diameter 8 mm. Pneumobilia throughout the intrahepatic bile ducts, which was present on prior CT. Pancreas: Normal, with no mass or duct dilation. Spleen: Normal size. No mass. Adrenals/Urinary Tract: Normal adrenals. No renal stones. No hydronephrosis. No contour deforming renal masses. Normal nondistended bladder. Stomach/Bowel: Normal non-distended stomach. Normal caliber small bowel with no small bowel wall thickening. Normal appendix. Normal large bowel with no diverticulosis, large bowel wall thickening or  pericolonic fat stranding. Vascular/Lymphatic: Atherosclerotic nonaneurysmal abdominal aorta. No pathologically enlarged lymph nodes in the abdomen or pelvis. Reproductive: Top-normal size prostate with nonspecific coarse internal prostatic calcifications. Other: No pneumoperitoneum, ascites or focal fluid collection. Musculoskeletal: No aggressive appearing focal osseous lesions. Mild thoracolumbar spondylosis. IMPRESSION: No acute abnormality. No evidence of bowel obstruction or acute bowel inflammation. Normal appendix. Bile ducts are stable and within normal post cholecystectomy limits. Pneumobilia, as on prior CT, presumably due to prior sphincterotomy. Aortic Atherosclerosis (ICD10-I70.0). Electronically Signed   By: Ilona Sorrel M.D.   On: 03/21/2019 17:18    Procedures Procedures (including critical care time)  Medications Ordered in ED Medications  sucralfate (CARAFATE) tablet 1 g (1 g Oral Not Given 03/23/19 0352)  ondansetron (ZOFRAN) injection 4 mg (4 mg Intravenous Given 03/23/19 0308)  sodium chloride 0.9 % bolus 1,000 mL (0 mLs Intravenous Stopped 03/23/19 0416)  droperidol (INAPSINE) 2.5 MG/ML injection 1.25 mg (1.25 mg Intravenous Given 03/23/19 0340)  sucralfate (CARAFATE) 1 GM/10ML suspension (1 g  Given 03/23/19 0340)     Initial Impression / Assessment and Plan / ED Course  I have reviewed the triage vital signs and the nursing notes.  Pertinent labs & imaging results that were available during my care of the patient were reviewed by me and considered in my medical decision making (see chart for details).        Patient presents with abdominal pain.  Reports that it is persistent since Saturday.  At that time he had negative lab work and a CT scan that did not show any evidence of pancreatic inflammation or other abnormality.  He is overall nontoxic-appearing and vital signs are reassuring.  He has some epigastric tenderness but no rebound or guarding.  Suspect this is  likely related to his chronic pain.  Will repeat lab work to see if there are any new derangements.  Patient was given fluids, Zofran, and droperidol.  Would like to avoid narcotic pain medication as I do not feel that this will benefit the patient.  Labs are reassuring.  After receiving droperidol, patient reported that he felt like he was having a reaction.  He was talking to me and stated "is doing something funny to my jaw like it is locking up."  I did not see any objective evidence of dystonic reaction or lockjaw.  I discussed the patient's work-up with the patient.  I have offered him a GI cocktail as this could be related to gastritis or peptic ulcer disease.  I have lower suspicion for chronic pancreatitis given no CT evidence of pancreatic inflammation.  Patient is requesting "can I just get 1 shot."  I discussed with him that I did not feel that narcotic pain medication was appropriate.  He has declined further treatment at this time.  I have recommended he follow-up with his family physician as well as his GI doctor.  I have offered him omeprazole and Carafate at discharge.  Unfortunately, patient left without receiving discharge paperwork.    Final Clinical Impressions(s) / ED Diagnoses   Final diagnoses:  Chronic abdominal pain    ED Discharge Orders         Ordered    omeprazole (PRILOSEC) 20 MG capsule  Daily     03/23/19 0423    sucralfate (CARAFATE) 1 g tablet  3 times daily with meals & bedtime     03/23/19 0423           Merryl Hacker, MD 03/23/19 [phone]

## 2019-03-23 NOTE — Telephone Encounter (Signed)
Rx at [facility] was cancelled spoke with [facility]

## 2019-03-23 NOTE — Telephone Encounter (Signed)
RX REFILL oxyCODONE (ROXICODONE) 15 MG immediate release tablet  PHARMACY [facility] [address], [place], [place] [address]  PCP prescribed medication to patient today [facility] [place] [facility] not [facility] and patient would like to [facility] to [facility] [address].  Patient call back [phone]

## 2019-03-23 NOTE — ED Notes (Signed)
Pt ambulated out of ED prior to receiving discharge. Removed IV himself and left in garbage. Unable to obtain esignature, review paperwork or obtain DC vitals.

## 2019-03-23 NOTE — ED Notes (Signed)
ED Provider at bedside. 

## 2019-03-23 NOTE — ED Notes (Signed)
Pt stating he has facial pain and "jaw locking up," speaking with staff without difficulty. Wants more pain medication. EDP at bedside

## 2019-03-23 NOTE — Discharge Instructions (Addendum)
You were seen today for abdominal pain.  Your work-up is reassuring.  I have reviewed your CT scan from Saturday and there is no evidence of pancreatitis.  You need to follow-up closely with your GI doctor as this could create gastritis or peptic ulcers.  Take medications as prescribed.

## 2019-03-23 NOTE — Telephone Encounter (Signed)
Pt calling back and stated that [facility] [facility].

## 2019-03-23 NOTE — Progress Notes (Signed)
Virtual Visit via Video Note   I connected with Mr Plazola on 03/23/19 by a video enabled telemedicine application and verified that I am speaking with the correct person using two identifiers.  Location patient: home Location provider:work office Persons participating in the virtual visit: patient, provider  I discussed the limitations of evaluation and management by telemedicine and the availability of in person appointments. The patient expressed understanding and agreed to proceed.   HPI: Mr Veale is a 43 yo male with Hx of chronic abdominal pain,pancreatitis,and HTN c/o worsening abdominal pain. He was last seen on 03/04/2019.  Currently he is on OxyContin 50 mg 3 times daily as needed, max 70 tablets/month. Chronic nausea and vomiting, currently he is on Phenergan 25 mg tablets or suppositories and Zofran ODT 8 mg every 8 hours as needed. Frequent ER visits due to abdominal pain.  Since his last visit he has been daily ED 3 times: 03/20/2019, 03/21/2019, and today. Earlier today he presented today ED complaining about severe epigastric abdominal pain radiated to his back. Constant, 10/10. He has not identified exacerbating or alleviating factors.  Today he was discharged home on omeprazole 20 mg daily and sucralfate 1 g 3 times daily with meals and bedtime. He has not had thoracic and lumbar MRI to evaluate for radicular pain.  On 03/21/2019 he had abdomen and pelvis CT: No acute abnormality. No evidence of bowel obstruction or acute bowel inflammation. Normal appendix. Bile ducts are stable and within normal post cholecystectomy limits. Pneumobilia, as on prior CT, presumably due to prior sphincterotomy. Aortic Atherosclerosis (ICD10-I70.0).  Lab Results  Component Value Date   CREATININE 0.94 03/23/2019   BUN 13 03/23/2019   NA 139 03/23/2019   K 3.4 (L) 03/23/2019   CL 107 03/23/2019   CO2 21 (L) 03/23/2019   Lab Results  Component Value Date   WBC 10.5 03/23/2019    HGB 17.1 (H) 03/23/2019   HCT 49.0 03/23/2019   MCV 86.0 03/23/2019   PLT 284 03/23/2019   Lab Results  Component Value Date   LIPASE 36 03/23/2019    Last visit we will try to change to a different opioid, hydrocodone-acetaminophen 10-325 mg 3 times daily but he felt like oxycodone was helping more with pain.  He denies fever, chills, body aches, unusual fatigue, changes in bowel habits, blood in the stool, melena, or urinary symptoms.  He follows with gastroenterologist and has an appointment in 2 to 3 weeks.  ROS: See pertinent positives and negatives per HPI.  Past Medical History:  Diagnosis Date  . Chronic abdominal pain   . Diverticulitis   . Drug-seeking behavior   . Hypertension   . Kidney stones   . Pancreatitis, chronic ([place]) 05/28/2005    Past Surgical History:  Procedure Laterality Date  . APPENDECTOMY    . CHOLECYSTECTOMY    . ERCP W/ METAL STENT PLACEMENT    . KIDNEY STONE SURGERY      Family History  Problem Relation Age of Onset  . Cancer Mother   . Early death Mother   . Hypertension Mother   . Heart failure Father   . Hypertension Father   . Heart disease Father   . Hyperlipidemia Father   . Pancreatic cancer Paternal Grandmother        possibly  . Pancreatic cancer Paternal Aunt   . Colon cancer Neg Hx     Social History   Socioeconomic History  . Marital status: Married    Spouse  name: Not on file  . Number of children: Not on file  . Years of education: Not on file  . Highest education level: Not on file  Occupational History  . Not on file  Social Needs  . Financial resource strain: Not on file  . Food insecurity    Worry: Not on file    Inability: Not on file  . Transportation needs    Medical: Not on file    Non-medical: Not on file  Tobacco Use  . Smoking status: Current Every Day Smoker    Packs/day: 0.50    Types: Cigarettes  . Smokeless tobacco: Never Used  Substance and Sexual Activity  . Alcohol use: Yes     Comment: rarely, pt stated that he drinks once ever 3 months and it is usually a beer at dinner  . Drug use: No  . Sexual activity: Yes    Partners: Female  Lifestyle  . Physical activity    Days per week: Not on file    Minutes per session: Not on file  . Stress: Not on file  Relationships  . Social Herbalist on phone: Not on file    Gets together: Not on file    Attends religious service: Not on file    Active member of club or organization: Not on file    Attends meetings of clubs or organizations: Not on file    Relationship status: Not on file  . Intimate partner violence    Fear of current or ex partner: Not on file    Emotionally abused: Not on file    Physically abused: Not on file    Forced sexual activity: Not on file  Other Topics Concern  . Not on file  Social History Narrative  . Not on file    Current Outpatient Medications:  .  amLODipine-benazepril (LOTREL) 5-20 MG capsule, TAKE 1 CAPSULE BY MOUTH EVERY DAY, Disp: 90 capsule, Rfl: 1 .  metoprolol succinate (TOPROL-XL) 100 MG 24 hr tablet, Take 1 tablet (100 mg total) by mouth daily. Take with or immediately following a meal., Disp: 90 tablet, Rfl: 2 .  naloxone (NARCAN) nasal spray 4 mg/0.1 mL, 1 spray in each nostril x 1 if opioid overdose., Disp: 1 each, Rfl: 1 .  omeprazole (PRILOSEC) 20 MG capsule, Take 1 capsule (20 mg total) by mouth daily., Disp: 30 capsule, Rfl: 0 .  ondansetron (ZOFRAN ODT) 8 MG disintegrating tablet, Take 1 tablet (8 mg total) by mouth every 8 (eight) hours as needed for nausea or vomiting., Disp: 20 tablet, Rfl: 0 .  oxyCODONE (OXYCONTIN) 10 mg 12 hr tablet, Take 1 tablet (10 mg total) by mouth every 12 (twelve) hours., Disp: 30 tablet, Rfl: 0 .  oxyCODONE (ROXICODONE) 15 MG immediate release tablet, Take 1 tablet (15 mg total) by mouth 2 (two) times daily as needed for pain. Can take an extra tab 1-2 times per week.No more than 70 tabs/month, Disp: 70 tablet, Rfl: 0 .   promethazine (PHENERGAN) 25 MG suppository, Place 1 suppository (25 mg total) rectally every 8 (eight) hours as needed for nausea or vomiting., Disp: 12 each, Rfl: 2 .  promethazine (PHENERGAN) 25 MG tablet, Take 1 tablet (25 mg total) by mouth every 12 (twelve) hours as needed., Disp: 45 tablet, Rfl: 1 .  sucralfate (CARAFATE) 1 g tablet, Take 1 tablet (1 g total) by mouth 4 (four) times daily -  with meals and at bedtime., Disp: 60 tablet,  Rfl: 0  EXAM:  VITALS per patient if applicable:There were no vitals taken for this visit.  GENERAL: alert, oriented, appears well and in no acute distress  HEENT: atraumatic, conjunctiva clear, no obvious abnormalities on inspection.  NECK: normal movements of the head and neck  LUNGS: on inspection no signs of respiratory distress, breathing rate appears normal, no obvious gross SOB, gasping or wheezing  CV: no obvious cyanosis  MS: moves all visible extremities without noticeable abnormality  PSYCH/NEURO: pleasant and cooperative, no obvious depression, he is anxious. Speech and thought processing grossly intact  ASSESSMENT AND PLAN:  Discussed the following assessment and plan:  Chronic pain disorder - Plan: oxyCODONE (OXYCONTIN) 10 mg 12 hr tablet  Chronic abdominal pain - Plan: oxyCODONE (OXYCONTIN) 10 mg 12 hr tablet  Chronic, continuous use of opioids - Plan: naloxone (NARCAN) nasal spray 4 mg/0.1 mL  Pain is not well controlled and getting worse. He has been declined service from pain management/pain clinic.  Medication contract signed on 04/04/2018. We discussed side effects of chronic opioid treatment and the risk of overdose if taking more than recommended. Urine tox next visit.  Options: Increasing dose of oxycodone or adding ER oxycodone, he prefer the latter one.Oxycodone ER 10 mg bid added today. Strongly recommend avoiding ER visits.  Naloxone nasal spray also sent to use as needed.  Pending lumbar and thoracic  MRI. Last Oxycodone 15 mg was filled on 03/04/19.  Keep next appointment with gastroenterologist. Follow-up in 2 weeks.  I discussed the assessment and treatment plan with the patient. He was provided an opportunity to ask questions and all were answered. He agreed with the plan and demonstrated an understanding of the instructions.   The patient was advised to call back or seek an in-person evaluation if the symptoms worsen or if the condition fails to improve as anticipated.  Return in about 2 weeks (around 04/06/2019) for pain.    Selam Pietsch [place], MD

## 2019-03-24 ENCOUNTER — Encounter: Payer: Self-pay | Admitting: Family Medicine

## 2019-03-30 ENCOUNTER — Other Ambulatory Visit: Payer: Self-pay | Admitting: Family Medicine

## 2019-03-30 DIAGNOSIS — M549 Dorsalgia, unspecified: Secondary | ICD-10-CM

## 2019-03-30 DIAGNOSIS — G8929 Other chronic pain: Secondary | ICD-10-CM

## 2019-03-30 NOTE — Telephone Encounter (Signed)
I see this has been filled on 03/23/2019

## 2019-03-31 NOTE — Telephone Encounter (Signed)
Last filled 03/23/2019 Last OV 03/23/2019  Ok to fill?

## 2019-04-02 ENCOUNTER — Other Ambulatory Visit: Payer: Self-pay | Admitting: Family Medicine

## 2019-04-02 DIAGNOSIS — M549 Dorsalgia, unspecified: Secondary | ICD-10-CM

## 2019-04-02 DIAGNOSIS — R109 Unspecified abdominal pain: Secondary | ICD-10-CM

## 2019-04-02 DIAGNOSIS — G8929 Other chronic pain: Secondary | ICD-10-CM

## 2019-04-03 ENCOUNTER — Encounter: Payer: Self-pay | Admitting: Family Medicine

## 2019-04-03 ENCOUNTER — Telehealth (INDEPENDENT_AMBULATORY_CARE_PROVIDER_SITE_OTHER): Payer: BLUE CROSS/BLUE SHIELD | Admitting: Family Medicine

## 2019-04-03 ENCOUNTER — Other Ambulatory Visit: Payer: Self-pay

## 2019-04-03 DIAGNOSIS — R109 Unspecified abdominal pain: Secondary | ICD-10-CM | POA: Diagnosis not present

## 2019-04-03 DIAGNOSIS — J309 Allergic rhinitis, unspecified: Secondary | ICD-10-CM

## 2019-04-03 DIAGNOSIS — R0981 Nasal congestion: Secondary | ICD-10-CM | POA: Diagnosis not present

## 2019-04-03 DIAGNOSIS — G8929 Other chronic pain: Secondary | ICD-10-CM

## 2019-04-03 DIAGNOSIS — G894 Chronic pain syndrome: Secondary | ICD-10-CM | POA: Diagnosis not present

## 2019-04-03 MED ORDER — OXYCODONE HCL 10 MG PO TABS: ORAL_TABLET | ORAL | 0 refills | Status: DC

## 2019-04-03 MED ORDER — FLUTICASONE PROPIONATE 50 MCG/ACT NA SUSP: 1.0000 | Freq: Two times a day (BID) | NASAL | 2 refills | Status: DC

## 2019-04-03 MED ORDER — OXYCODONE HCL ER 10 MG PO T12A: 10.0000 mg | EXTENDED_RELEASE_TABLET | Freq: Two times a day (BID) | ORAL | 0 refills | Status: DC

## 2019-04-03 NOTE — Telephone Encounter (Signed)
Last filled 03/04/2019 Last OV 03/23/2019 Ok to fill?

## 2019-04-03 NOTE — Progress Notes (Signed)
Virtual Visit via Video Note   I connected with [name] on 11/6/202 by a video enabled telemedicine application and verified that I am speaking with the correct person using two identifiers.  Location patient: home Location provider:work office Persons participating in the virtual visit: patient, provider  I discussed the limitations of evaluation and management by telemedicine and the availability of in person appointments. The patient expressed understanding and agreed to proceed.   HPI: [name] is a 43 year old male with history of chronic pain and allergies, complaining about "sinus problems." 3 days of nasal congestion, rhinorrhea, postnasal drainage, and "scratchy" throat. He denies fever, chills, unusual fatigue or body aches. Negative for headache, earache, sore throat, cough, wheezing, dyspnea, changes in abdominal pain or nausea. He denies changes in the smell or taste. He has taking OTC Sudafed.  Is stable.  Father the mother-in-law was recently treated with amoxicillin for similar symptoms.  Chronic pain: On 03/23/19 oxycodone ER 10 mg was added to take twice daily. He is taking oxycodone 50 mg 2-3 times per day as needed, he is requesting refills. He has tolerated oxycodone ER well, denies side effects. He feels like abdominal pain may be "a little" better.  He has not have ER visit since his last visit.  ROS: See pertinent positives and negatives per HPI.  Past Medical History:  Diagnosis Date  . Chronic abdominal pain   . Diverticulitis   . Drug-seeking behavior   . Hypertension   . Kidney stones   . Pancreatitis, chronic ([place]) 05/28/2005    Past Surgical History:  Procedure Laterality Date  . APPENDECTOMY    . CHOLECYSTECTOMY    . ERCP W/ METAL STENT PLACEMENT    . KIDNEY STONE SURGERY      Family History  Problem Relation Age of Onset  . Cancer Mother   . Early death Mother   . Hypertension Mother   . Heart failure Father   . Hypertension  Father   . Heart disease Father   . Hyperlipidemia Father   . Pancreatic cancer Paternal Grandmother        possibly  . Pancreatic cancer Paternal Aunt   . Colon cancer Neg Hx     Social History   Socioeconomic History  . Marital status: Married    Spouse name: Not on file  . Number of children: Not on file  . Years of education: Not on file  . Highest education level: Not on file  Occupational History  . Not on file  Social Needs  . Financial resource strain: Not on file  . Food insecurity    Worry: Not on file    Inability: Not on file  . Transportation needs    Medical: Not on file    Non-medical: Not on file  Tobacco Use  . Smoking status: Current Every Day Smoker    Packs/day: 0.50    Types: Cigarettes  . Smokeless tobacco: Never Used  Substance and Sexual Activity  . Alcohol use: Yes    Comment: rarely, pt stated that he drinks once ever 3 months and it is usually a beer at dinner  . Drug use: No  . Sexual activity: Yes    Partners: Female  Lifestyle  . Physical activity    Days per week: Not on file    Minutes per session: Not on file  . Stress: Not on file  Relationships  . Social connections    Talks on phone: Not on file  Gets together: Not on file    Attends religious service: Not on file    Active member of club or organization: Not on file    Attends meetings of clubs or organizations: Not on file    Relationship status: Not on file  . Intimate partner violence    Fear of current or ex partner: Not on file    Emotionally abused: Not on file    Physically abused: Not on file    Forced sexual activity: Not on file  Other Topics Concern  . Not on file  Social History Narrative  . Not on file      Current Outpatient Medications:  .  amLODipine-benazepril (LOTREL) 5-20 MG capsule, TAKE 1 CAPSULE BY MOUTH EVERY DAY, Disp: 90 capsule, Rfl: 1 .  fluticasone (FLONASE) 50 MCG/ACT nasal spray, Place 1 spray into both nostrils 2 (two) times daily.,  Disp: 16 g, Rfl: 2 .  metoprolol succinate (TOPROL-XL) 100 MG 24 hr tablet, Take 1 tablet (100 mg total) by mouth daily. Take with or immediately following a meal., Disp: 90 tablet, Rfl: 2 .  naloxone (NARCAN) nasal spray 4 mg/0.1 mL, 1 spray in each nostril x 1 if opioid overdose., Disp: 1 each, Rfl: 1 .  omeprazole (PRILOSEC) 20 MG capsule, Take 1 capsule (20 mg total) by mouth daily., Disp: 30 capsule, Rfl: 0 .  ondansetron (ZOFRAN ODT) 8 MG disintegrating tablet, Take 1 tablet (8 mg total) by mouth every 8 (eight) hours as needed for nausea or vomiting., Disp: 20 tablet, Rfl: 0 .  oxyCODONE (OXYCONTIN) 10 mg 12 hr tablet, Take 1 tablet (10 mg total) by mouth every 12 (twelve) hours., Disp: 60 tablet, Rfl: 0 .  Oxycodone HCl 10 MG TABS, 1 tablet twice daily as needed, can take an extra tablet if needed.  No more than 70 tablets/month., Disp: 70 tablet, Rfl: 0 .  promethazine (PHENERGAN) 25 MG suppository, Place 1 suppository (25 mg total) rectally every 8 (eight) hours as needed for nausea or vomiting., Disp: 12 each, Rfl: 2 .  promethazine (PHENERGAN) 25 MG tablet, Take 1 tablet (25 mg total) by mouth every 12 (twelve) hours as needed., Disp: 45 tablet, Rfl: 1 .  sucralfate (CARAFATE) 1 g tablet, Take 1 tablet (1 g total) by mouth 4 (four) times daily -  with meals and at bedtime., Disp: 60 tablet, Rfl: 0  EXAM:  VITALS per patient if applicable:N/A  GENERAL: alert, oriented, appears well and in no acute distress  HEENT: atraumatic, conjunctiva clear, no obvious abnormalities on inspection of external nose and ears Nasal voice. NECK: normal movements of the head and neck  LUNGS: on inspection no signs of respiratory distress, breathing rate appears normal, no obvious gross SOB, gasping or wheezing  CV: no obvious cyanosis  MS: moves all visible extremities without noticeable abnormality  PSYCH/NEURO: pleasant and cooperative, no obvious depression.  He seems anxious.   ASSESSMENT AND  PLAN:  Discussed the following assessment and plan:  Allergic rhinitis, unspecified seasonality, unspecified trigger - Plan: fluticasone (FLONASE) 50 MCG/ACT nasal spray Explain the symptoms seem to be caused by allergies. Other possible etiologies discussed. Recommend nasal saline irrigation as needed. OTC cetirizine 10 mg may help.  Chronic pain disorder - Plan: oxyCODONE (OXYCONTIN) 10 mg 12 hr tablet, Oxycodone HCl 10 MG TABS We need to sign medication contract next office visit. No changes in oxycodone XR 10 mg twice daily. We discussed side effects of medication. He has nasal naloxone.  Nasal  sinus congestion I do not think antibiotic treatment is needed at this time. He is allergic to oral prednisone, in the past he has developed hives. Recommend intranasal steroid, Flonase. Monitor for fever or worsening symptoms. Instructed to let me know if he is still symptomatic in 7 to 10 days or if symptoms get worse. Instructed about warning signs.  Chronic abdominal pain - Plan: oxyCODONE (OXYCONTIN) 10 mg 12 hr tablet, Oxycodone HCl 10 MG TABS Pain seems to be better controlled. We will decrease oxycodone dose from 50 mg to 10 mg to continue taking twice daily as needed and an extra tablet if needed, total of 70 tablets/month. He understands side effects of medications. Urine tox to be done next office visit. Will plan on continuing decreasing Oxycodone dose.If needed we can increase dose of oxycodone ER from 10 mg to 15 mg.   I discussed the assessment and treatment plan with the patient. He was provided an opportunity to ask questions and all were answered. He agreed with the plan and demonstrated an understanding of the instructions.   Return in about 4 weeks (around 05/01/2019) for pain manageemnt. .   Meranda Dechaine Martin[place], MD

## 2019-04-06 ENCOUNTER — Telehealth: Payer: BLUE CROSS/BLUE SHIELD | Admitting: Family Medicine

## 2019-04-27 ENCOUNTER — Other Ambulatory Visit: Payer: Self-pay | Admitting: Family Medicine

## 2019-04-27 DIAGNOSIS — G8929 Other chronic pain: Secondary | ICD-10-CM

## 2019-04-27 DIAGNOSIS — R109 Unspecified abdominal pain: Secondary | ICD-10-CM

## 2019-04-27 DIAGNOSIS — G894 Chronic pain syndrome: Secondary | ICD-10-CM

## 2019-04-27 NOTE — Telephone Encounter (Signed)
Last filled 04/03/2019 Last OV 04/03/2019

## 2019-04-28 ENCOUNTER — Encounter: Payer: Self-pay | Admitting: Family Medicine

## 2019-04-28 ENCOUNTER — Telehealth (INDEPENDENT_AMBULATORY_CARE_PROVIDER_SITE_OTHER): Payer: BLUE CROSS/BLUE SHIELD | Admitting: Family Medicine

## 2019-04-28 ENCOUNTER — Other Ambulatory Visit: Payer: Self-pay

## 2019-04-28 VITALS — Ht 73.0 in

## 2019-04-28 DIAGNOSIS — J309 Allergic rhinitis, unspecified: Secondary | ICD-10-CM

## 2019-04-28 DIAGNOSIS — G894 Chronic pain syndrome: Secondary | ICD-10-CM

## 2019-04-28 DIAGNOSIS — J3489 Other specified disorders of nose and nasal sinuses: Secondary | ICD-10-CM

## 2019-04-28 MED ORDER — OXYCODONE HCL 10 MG PO TABS: ORAL_TABLET | ORAL | 0 refills | Status: DC

## 2019-04-28 MED ORDER — DOXYCYCLINE HYCLATE 100 MG PO TABS: 100.0000 mg | ORAL_TABLET | Freq: Two times a day (BID) | ORAL | 0 refills | Status: AC

## 2019-04-28 MED ORDER — XTAMPZA ER 9 MG PO C12A: 1.0000 | EXTENDED_RELEASE_CAPSULE | Freq: Two times a day (BID) | ORAL | 0 refills | Status: DC

## 2019-04-28 NOTE — Progress Notes (Signed)
Virtual Visit via Video Note   I connected with [name] on 04/28/19 by a video enabled telemedicine application and verified that I am speaking with the correct person using two identifiers.  Location patient: home Location provider:work office Persons participating in the virtual visit: patient, provider  I discussed the limitations of evaluation and management by telemedicine and the availability of in person appointments. The patient expressed understanding and agreed to proceed.   HPI: [name] is a 43 yo male c/o persistent "sinus problems." I saw him last on 04/03/19 with similar concern, recommend symptomatic treatment. Mild maxillary sinus pain. + Nasal congestion,rhinorrhea,and post nasal drainage. Denies fever,chills,body aches, headache,sore throat,cough,wheezing,dyspnea,or CP. He is using Flonase nasal spray as instructed.  He has not identified exacerbating or alleviating factors.  No recent sick contact, 4 weeks ago his in-laws were sick with upper respiratory symptoms.  No changes in smell or taste. Chronic pain disorder,15+ years hx of abdominal pain. He has been on Oxycodone 15 mg tid for a while now but did not seem to be helping as much as it did with pain. Hydrocodone 7.5-325 mg did not help.  Last visit oxycodone ER 10 mg twice daily was started, pain has been better control.  States that he e just received 45 tablets instead 60 tabs.  According to patient his insurance will not cover this medication. Oxycodone was decreased from 15 mg to 10 mg , 1 tablet 3 times daily as needed (max 70 tablets/month).  Tolerating medication well,no side effects reported.  ROS: See pertinent positives and negatives per HPI.  Past Medical History:  Diagnosis Date  . Chronic abdominal pain   . Diverticulitis   . Drug-seeking behavior   . Hypertension   . Kidney stones   . Pancreatitis, chronic ([place]) 05/28/2005    Past Surgical History:  Procedure Laterality Date  .  APPENDECTOMY    . CHOLECYSTECTOMY    . ERCP W/ METAL STENT PLACEMENT    . KIDNEY STONE SURGERY      Family History  Problem Relation Age of Onset  . Cancer Mother   . Early death Mother   . Hypertension Mother   . Heart failure Father   . Hypertension Father   . Heart disease Father   . Hyperlipidemia Father   . Pancreatic cancer Paternal Grandmother        possibly  . Pancreatic cancer Paternal Aunt   . Colon cancer Neg Hx     Social History   Socioeconomic History  . Marital status: Married    Spouse name: Not on file  . Number of children: Not on file  . Years of education: Not on file  . Highest education level: Not on file  Occupational History  . Not on file  Social Needs  . Financial resource strain: Not on file  . Food insecurity    Worry: Not on file    Inability: Not on file  . Transportation needs    Medical: Not on file    Non-medical: Not on file  Tobacco Use  . Smoking status: Current Every Day Smoker    Packs/day: 0.50    Types: Cigarettes  . Smokeless tobacco: Never Used  Substance and Sexual Activity  . Alcohol use: Yes    Comment: rarely, pt stated that he drinks once ever 3 months and it is usually a beer at dinner  . Drug use: No  . Sexual activity: Yes    Partners: Female  Lifestyle  .  Physical activity    Days per week: Not on file    Minutes per session: Not on file  . Stress: Not on file  Relationships  . Social Herbalist on phone: Not on file    Gets together: Not on file    Attends religious service: Not on file    Active member of club or organization: Not on file    Attends meetings of clubs or organizations: Not on file    Relationship status: Not on file  . Intimate partner violence    Fear of current or ex partner: Not on file    Emotionally abused: Not on file    Physically abused: Not on file    Forced sexual activity: Not on file  Other Topics Concern  . Not on file  Social History Narrative  . Not on  file      Current Outpatient Medications:  .  amLODipine-benazepril (LOTREL) 5-20 MG capsule, TAKE 1 CAPSULE BY MOUTH EVERY DAY, Disp: 90 capsule, Rfl: 1 .  doxycycline (VIBRA-TABS) 100 MG tablet, Take 1 tablet (100 mg total) by mouth 2 (two) times daily for 7 days., Disp: 14 tablet, Rfl: 0 .  fluticasone (FLONASE) 50 MCG/ACT nasal spray, Place 1 spray into both nostrils 2 (two) times daily., Disp: 16 g, Rfl: 2 .  metoprolol succinate (TOPROL-XL) 100 MG 24 hr tablet, Take 1 tablet (100 mg total) by mouth daily. Take with or immediately following a meal., Disp: 90 tablet, Rfl: 2 .  naloxone (NARCAN) nasal spray 4 mg/0.1 mL, 1 spray in each nostril x 1 if opioid overdose., Disp: 1 each, Rfl: 1 .  omeprazole (PRILOSEC) 20 MG capsule, Take 1 capsule (20 mg total) by mouth daily., Disp: 30 capsule, Rfl: 0 .  ondansetron (ZOFRAN ODT) 8 MG disintegrating tablet, Take 1 tablet (8 mg total) by mouth every 8 (eight) hours as needed for nausea or vomiting., Disp: 20 tablet, Rfl: 0 .  oxyCODONE ER (XTAMPZA ER) 9 MG C12A, Take 1 tablet by mouth 2 (two) times daily., Disp: 60 capsule, Rfl: 0 .  Oxycodone HCl 10 MG TABS, 1 tablet twice daily as needed, can take an extra tablet if needed.  No more than 70 tablets/month., Disp: 70 tablet, Rfl: 0 .  promethazine (PHENERGAN) 25 MG suppository, Place 1 suppository (25 mg total) rectally every 8 (eight) hours as needed for nausea or vomiting., Disp: 12 each, Rfl: 2 .  promethazine (PHENERGAN) 25 MG tablet, Take 1 tablet (25 mg total) by mouth every 12 (twelve) hours as needed., Disp: 45 tablet, Rfl: 1 .  sucralfate (CARAFATE) 1 g tablet, Take 1 tablet (1 g total) by mouth 4 (four) times daily -  with meals and at bedtime., Disp: 60 tablet, Rfl: 0  EXAM:  VITALS per patient if applicable:Ht 6\' 1"  (1.854 m)   BMI 27.05 kg/m   GENERAL: alert, oriented, appears well and in no acute distress  HEENT: atraumatic, conjunctiva clear, no obvious abnormalities on  inspection. Nasal congestion and rhinorrhea.  NECK: normal movements of the head and neck  LUNGS: on inspection no signs of respiratory distress, breathing rate appears normal, no obvious gross SOB, gasping or wheezing  CV: no obvious cyanosis  MS: moves all visible extremities without noticeable abnormality  PSYCH/NEURO: pleasant and cooperative, no obvious depression, + anxious. Speech and thought processing grossly intact  ASSESSMENT AND PLAN:  Discussed the following assessment and plan:  Sinus pain Explain that symptoms could be caused  by allergy rhinitis. Maxillary sinus pain is seems to be mild, he is still concerned about possible bacterial infection, so doxycycline was recommended.  Chronic pain disorder - Plan: oxyCODONE ER (XTAMPZA ER) 9 MG C12A Oxycodone ER was helping to control pain. I sent a new Rx to his pharmacy. Instructed pt to call his insurance to find out which long acting opioid is covered under his plan. No changes in Oxycodone dose. Side effects of medications discussed.  [place] controlled subs report web site reviewed. Med contract is current.  Allergic rhinitis, unspecified seasonality, unspecified trigger Continue Flonase nasal spray daily as needed. Nasal saline irrigations as needed. OTC antihistaminic may also help.   I discussed the assessment and treatment plan with the patient. He was provided an opportunity to ask questions and all were answered. He agreed with the plan and demonstrated an understanding of the instructions.   The patient was advised to call back or seek an in-person evaluation if the symptoms worsen or if the condition fails to improve as anticipated.  No follow-ups on file.     [place], MD

## 2019-04-29 ENCOUNTER — Encounter: Payer: Self-pay | Admitting: Family Medicine

## 2019-05-03 ENCOUNTER — Other Ambulatory Visit: Payer: Self-pay | Admitting: Family Medicine

## 2019-05-03 DIAGNOSIS — K861 Other chronic pancreatitis: Secondary | ICD-10-CM

## 2019-05-03 DIAGNOSIS — R112 Nausea with vomiting, unspecified: Secondary | ICD-10-CM

## 2019-05-05 ENCOUNTER — Encounter: Payer: Self-pay | Admitting: Family Medicine

## 2019-05-12 ENCOUNTER — Other Ambulatory Visit: Payer: Self-pay | Admitting: Family Medicine

## 2019-05-25 ENCOUNTER — Other Ambulatory Visit: Payer: Self-pay | Admitting: Family Medicine

## 2019-05-25 DIAGNOSIS — G8929 Other chronic pain: Secondary | ICD-10-CM

## 2019-05-25 DIAGNOSIS — G894 Chronic pain syndrome: Secondary | ICD-10-CM

## 2019-05-27 ENCOUNTER — Other Ambulatory Visit: Payer: Self-pay | Admitting: Family Medicine

## 2019-05-27 DIAGNOSIS — G8929 Other chronic pain: Secondary | ICD-10-CM

## 2019-05-27 DIAGNOSIS — R109 Unspecified abdominal pain: Secondary | ICD-10-CM

## 2019-05-27 DIAGNOSIS — G894 Chronic pain syndrome: Secondary | ICD-10-CM

## 2019-05-28 NOTE — Telephone Encounter (Signed)
Message routed to PCP.

## 2019-05-29 MED ORDER — OXYCODONE HCL 10 MG PO TABS: ORAL_TABLET | ORAL | 0 refills | Status: DC

## 2019-06-03 MED ORDER — XTAMPZA ER 9 MG PO C12A: 1.0000 | EXTENDED_RELEASE_CAPSULE | Freq: Two times a day (BID) | ORAL | 0 refills | Status: DC

## 2019-06-21 ENCOUNTER — Other Ambulatory Visit: Payer: Self-pay | Admitting: Family Medicine

## 2019-06-21 DIAGNOSIS — R112 Nausea with vomiting, unspecified: Secondary | ICD-10-CM

## 2019-06-21 DIAGNOSIS — K861 Other chronic pancreatitis: Secondary | ICD-10-CM

## 2019-06-23 ENCOUNTER — Telehealth (INDEPENDENT_AMBULATORY_CARE_PROVIDER_SITE_OTHER): Payer: BLUE CROSS/BLUE SHIELD | Admitting: Family Medicine

## 2019-06-23 ENCOUNTER — Encounter: Payer: Self-pay | Admitting: Family Medicine

## 2019-06-23 VITALS — BP 126/80

## 2019-06-23 DIAGNOSIS — I10 Essential (primary) hypertension: Secondary | ICD-10-CM

## 2019-06-23 DIAGNOSIS — G894 Chronic pain syndrome: Secondary | ICD-10-CM

## 2019-06-23 DIAGNOSIS — R112 Nausea with vomiting, unspecified: Secondary | ICD-10-CM

## 2019-06-23 MED ORDER — ONDANSETRON 8 MG PO TBDP: 8.0000 mg | ORAL_TABLET | Freq: Three times a day (TID) | ORAL | 1 refills | Status: DC | PRN

## 2019-06-23 NOTE — Progress Notes (Signed)
Virtual Visit via Video Note   I connected with [name] on 06/23/19 by a video enabled telemedicine application and verified that I am speaking with the correct person using two identifiers.  Location patient: home Location provider:work office Persons participating in the virtual visit: patient, provider  I discussed the limitations of evaluation and management by telemedicine and the availability of in person appointments. The patient expressed understanding and agreed to proceed.   HPI: [name] is a 44 yo male with hx of HTN and chronic pain. He is c/o worsening upper abdominal pain for the past week. Associated nausea and vomiting. He has vomiting 3-4 times since yesterday. He is on Phenergan and Zofran.  Years hx of abdominal pain,upper abdominal pain mainly. He has been dx'ed with chronic pancreatitis. Pain has been 7-8/10. Imaging studies have been done.  GI evaluation, thought pain could be radicular pain. Thoracic and lumbar MRI have been ordered x 2 and have not been done.  No changes in bowel movement,blood in stool,or melena.  Currently he is on Oxycodone ER 9 mg bid and Oxycodone 10 mg tid as needed max 70 tabs per months. He has nasal naloxone. No side effects. Medications are not helping with pain this time.  Pain is constant with periods of exacerbation. He has not identified exacerbating or alleviating factors. Episodes have decreased in frequency.  He has been referred to pain management but because it is not musculoskeletal pain, referral has not been accepted.  HTN: Currently he is on Lotrel 5-20 mg daily and Metoprolol Succinate 100 mg daily. BP has been "good." 120's-130's/80's.  Lab Results  Component Value Date   CREATININE 0.94 03/23/2019   BUN 13 03/23/2019   NA 139 03/23/2019   K 3.4 (L) 03/23/2019   CL 107 03/23/2019   CO2 21 (L) 03/23/2019   Denies severe/frequent headache, visual changes, chest pain, dyspnea, palpitation,  claudication, focal weakness, or edema.   ROS: See pertinent positives and negatives per HPI.  Past Medical History:  Diagnosis Date  . Chronic abdominal pain   . Diverticulitis   . Drug-seeking behavior   . Hypertension   . Kidney stones   . Pancreatitis, chronic ([place]) 05/28/2005    Past Surgical History:  Procedure Laterality Date  . APPENDECTOMY    . CHOLECYSTECTOMY    . ERCP W/ METAL STENT PLACEMENT    . KIDNEY STONE SURGERY      Family History  Problem Relation Age of Onset  . Cancer Mother   . Early death Mother   . Hypertension Mother   . Heart failure Father   . Hypertension Father   . Heart disease Father   . Hyperlipidemia Father   . Pancreatic cancer Paternal Grandmother        possibly  . Pancreatic cancer Paternal Aunt   . Colon cancer Neg Hx     Social History   Socioeconomic History  . Marital status: Married    Spouse name: Not on file  . Number of children: Not on file  . Years of education: Not on file  . Highest education level: Not on file  Occupational History  . Not on file  Tobacco Use  . Smoking status: Current Every Day Smoker    Packs/day: 0.50    Types: Cigarettes  . Smokeless tobacco: Never Used  Substance and Sexual Activity  . Alcohol use: Yes    Comment: rarely, pt stated that he drinks once ever 3 months and it is usually  a beer at dinner  . Drug use: No  . Sexual activity: Yes    Partners: Female  Other Topics Concern  . Not on file  Social History Narrative  . Not on file   Social Determinants of Health   Financial Resource Strain:   . Difficulty of Paying Living Expenses: Not on file  Food Insecurity:   . Worried About Charity fundraiser in the Last Year: Not on file  . Ran Out of Food in the Last Year: Not on file  Transportation Needs:   . Lack of Transportation (Medical): Not on file  . Lack of Transportation (Non-Medical): Not on file  Physical Activity:   . Days of Exercise per Week: Not on file  .  Minutes of Exercise per Session: Not on file  Stress:   . Feeling of Stress : Not on file  Social Connections:   . Frequency of Communication with Friends and Family: Not on file  . Frequency of Social Gatherings with Friends and Family: Not on file  . Attends Religious Services: Not on file  . Active Member of Clubs or Organizations: Not on file  . Attends Archivist Meetings: Not on file  . Marital Status: Not on file  Intimate Partner Violence:   . Fear of Current or Ex-Partner: Not on file  . Emotionally Abused: Not on file  . Physically Abused: Not on file  . Sexually Abused: Not on file    Current Outpatient Medications:  .  amLODipine-benazepril (LOTREL) 5-20 MG capsule, TAKE 1 CAPSULE BY MOUTH EVERY DAY, Disp: 90 capsule, Rfl: 1 .  fluticasone (FLONASE) 50 MCG/ACT nasal spray, Place 1 spray into both nostrils 2 (two) times daily., Disp: 16 g, Rfl: 2 .  metoprolol succinate (TOPROL-XL) 100 MG 24 hr tablet, Take 1 tablet (100 mg total) by mouth daily. Take with or immediately following a meal., Disp: 90 tablet, Rfl: 2 .  naloxone (NARCAN) nasal spray 4 mg/0.1 mL, 1 spray in each nostril x 1 if opioid overdose., Disp: 1 each, Rfl: 1 .  omeprazole (PRILOSEC) 20 MG capsule, Take 1 capsule (20 mg total) by mouth daily., Disp: 30 capsule, Rfl: 0 .  oxyCODONE ER (XTAMPZA ER) 9 MG C12A, Take 1 tablet by mouth 2 (two) times daily., Disp: 60 capsule, Rfl: 0 .  Oxycodone HCl 10 MG TABS, 1 tablet twice daily as needed, can take an extra tablet if needed.  No more than 70 tablets/month., Disp: 70 tablet, Rfl: 0 .  promethazine (PHENERGAN) 25 MG suppository, Place 1 suppository (25 mg total) rectally every 8 (eight) hours as needed for nausea or vomiting., Disp: 12 each, Rfl: 2 .  promethazine (PHENERGAN) 25 MG tablet, TAKE 1 TABLET (25 MG TOTAL) BY MOUTH EVERY 12 (TWELVE) HOURS AS NEEDED., Disp: 45 tablet, Rfl: 1 .  sucralfate (CARAFATE) 1 g tablet, Take 1 tablet (1 g total) by mouth 4  (four) times daily -  with meals and at bedtime., Disp: 60 tablet, Rfl: 0 .  ondansetron (ZOFRAN ODT) 8 MG disintegrating tablet, Take 1 tablet (8 mg total) by mouth every 8 (eight) hours as needed for nausea or vomiting., Disp: 20 tablet, Rfl: 1  EXAM:  VITALS per patient if applicable:BP [id number]   GENERAL: alert, oriented, appears well and in no acute distress  HEENT: atraumatic, conjunctiva clear, no obvious abnormalities on inspection.  NECK: normal movements of the head and neck  LUNGS: on inspection no signs of respiratory distress, breathing rate  appears normal, no obvious gross SOB, gasping or wheezing  CV: no obvious cyanosis  PSYCH/NEURO: pleasant and cooperative, no obvious depression or anxiety, speech and thought processing grossly intact  ASSESSMENT AND PLAN:  Discussed the following assessment and plan:  Chronic pain disorder - Plan: Ambulatory referral to Pain Clinic In general he thinks current management has helped but asking if something can be adjusted to obtain better pain controlled. I have recommended not to go to the ER because he was going frequently and getting IV opioid. Instructed to go to the ER if pain gets worse he needs to go to the ER. I do not feel comfortable increasing dose. Pain clinic referral placed.  Nausea and vomiting in adult Problem is stable, not well controlled. Continue Phenergan and Zofran. We have discussed possible causes including chronic opioid use and gastroparesis, he had side effects with Reglan in the past. He also follows with GI.  Hypertension, essential, benign Problem is well controlled. Continue monitoring BP. No changes in current management.   I discussed the assessment and treatment plan with the patient. [name] was provided an opportunity to ask questions and all were answered. He agreed with the plan and demonstrated an understanding of the instructions.    Return in about 3 months (around 09/21/2019), or if  symptoms worsen or fail to improve.    Emira Eubanks Martinique, MD

## 2019-06-24 ENCOUNTER — Other Ambulatory Visit: Payer: Self-pay

## 2019-06-24 MED ORDER — ONDANSETRON 8 MG PO TBDP: 8.0000 mg | ORAL_TABLET | Freq: Three times a day (TID) | ORAL | 1 refills | Status: DC | PRN

## 2019-06-26 ENCOUNTER — Other Ambulatory Visit: Payer: Self-pay | Admitting: Family Medicine

## 2019-06-26 DIAGNOSIS — G894 Chronic pain syndrome: Secondary | ICD-10-CM

## 2019-06-26 DIAGNOSIS — G8929 Other chronic pain: Secondary | ICD-10-CM

## 2019-06-26 DIAGNOSIS — R109 Unspecified abdominal pain: Secondary | ICD-10-CM

## 2019-06-26 NOTE — Telephone Encounter (Signed)
Last filled 06/01/2019

## 2019-06-30 ENCOUNTER — Other Ambulatory Visit: Payer: Self-pay | Admitting: Family Medicine

## 2019-06-30 DIAGNOSIS — R109 Unspecified abdominal pain: Secondary | ICD-10-CM

## 2019-06-30 DIAGNOSIS — G894 Chronic pain syndrome: Secondary | ICD-10-CM

## 2019-06-30 DIAGNOSIS — G8929 Other chronic pain: Secondary | ICD-10-CM

## 2019-07-01 ENCOUNTER — Telehealth: Payer: Self-pay | Admitting: Family Medicine

## 2019-07-01 MED ORDER — OXYCODONE HCL 10 MG PO TABS: ORAL_TABLET | ORAL | 0 refills | Status: DC

## 2019-07-01 NOTE — Telephone Encounter (Signed)
Pt called in regards to his mychart request to have his Oxycodone HCI 10 MG refilled. He mentioned that he took his last one last night 2/2 and would like it to be sent as soon as possible due to him being out.

## 2019-07-01 NOTE — Telephone Encounter (Signed)
Dr. Martinique has refilled Rx.

## 2019-07-06 ENCOUNTER — Other Ambulatory Visit: Payer: Self-pay | Admitting: Family Medicine

## 2019-07-06 DIAGNOSIS — G894 Chronic pain syndrome: Secondary | ICD-10-CM

## 2019-07-07 ENCOUNTER — Encounter: Payer: Self-pay | Admitting: Family Medicine

## 2019-07-10 ENCOUNTER — Other Ambulatory Visit: Payer: Self-pay

## 2019-07-10 DIAGNOSIS — G894 Chronic pain syndrome: Secondary | ICD-10-CM

## 2019-07-10 MED ORDER — XTAMPZA ER 9 MG PO C12A: 1.0000 | EXTENDED_RELEASE_CAPSULE | Freq: Two times a day (BID) | ORAL | 0 refills | Status: DC

## 2019-07-27 ENCOUNTER — Other Ambulatory Visit: Payer: Self-pay | Admitting: Family Medicine

## 2019-07-27 DIAGNOSIS — G894 Chronic pain syndrome: Secondary | ICD-10-CM

## 2019-07-27 DIAGNOSIS — G8929 Other chronic pain: Secondary | ICD-10-CM

## 2019-07-27 NOTE — Telephone Encounter (Signed)
Rx last filled 07/02/19

## 2019-07-29 MED ORDER — OXYCODONE HCL 10 MG PO TABS: ORAL_TABLET | ORAL | 0 refills | Status: DC

## 2019-08-05 ENCOUNTER — Other Ambulatory Visit: Payer: Self-pay | Admitting: Family Medicine

## 2019-08-05 DIAGNOSIS — G894 Chronic pain syndrome: Secondary | ICD-10-CM

## 2019-08-05 NOTE — Telephone Encounter (Signed)
Rx last filled 07/12/19

## 2019-08-07 MED ORDER — XTAMPZA ER 9 MG PO C12A: 1.0000 | EXTENDED_RELEASE_CAPSULE | Freq: Two times a day (BID) | ORAL | 0 refills | Status: DC

## 2019-08-24 ENCOUNTER — Other Ambulatory Visit: Payer: Self-pay | Admitting: Family Medicine

## 2019-08-24 DIAGNOSIS — G8929 Other chronic pain: Secondary | ICD-10-CM

## 2019-08-24 DIAGNOSIS — G894 Chronic pain syndrome: Secondary | ICD-10-CM

## 2019-08-25 ENCOUNTER — Other Ambulatory Visit: Payer: Self-pay

## 2019-08-25 ENCOUNTER — Emergency Department (HOSPITAL_BASED_OUTPATIENT_CLINIC_OR_DEPARTMENT_OTHER)
Admission: EM | Admit: 2019-08-25 | Discharge: 2019-08-26 | Disposition: A | Discharge status: Home / Self Care | Payer: BLUE CROSS/BLUE SHIELD | Attending: Emergency Medicine | Admitting: Emergency Medicine

## 2019-08-25 ENCOUNTER — Encounter (HOSPITAL_BASED_OUTPATIENT_CLINIC_OR_DEPARTMENT_OTHER): Payer: Self-pay | Admitting: Emergency Medicine

## 2019-08-25 DIAGNOSIS — Z79899 Other long term (current) drug therapy: Secondary | ICD-10-CM | POA: Diagnosis not present

## 2019-08-25 DIAGNOSIS — F17210 Nicotine dependence, cigarettes, uncomplicated: Secondary | ICD-10-CM | POA: Insufficient documentation

## 2019-08-25 DIAGNOSIS — R1013 Epigastric pain: Secondary | ICD-10-CM | POA: Insufficient documentation

## 2019-08-25 DIAGNOSIS — G8929 Other chronic pain: Secondary | ICD-10-CM | POA: Diagnosis not present

## 2019-08-25 DIAGNOSIS — I10 Essential (primary) hypertension: Secondary | ICD-10-CM | POA: Diagnosis not present

## 2019-08-25 LAB — CBC
HCT: 46.4 % (ref 39.0–52.0)
Hemoglobin: 16.4 g/dL (ref 13.0–17.0)
MCH: 30.3 pg (ref 26.0–34.0)
MCHC: 35.3 g/dL (ref 30.0–36.0)
MCV: 85.6 fL (ref 80.0–100.0)
Platelets: 231 10*3/uL (ref 150–400)
RBC: 5.42 MIL/uL (ref 4.22–5.81)
RDW: 12.5 % (ref 11.5–15.5)
WBC: 11.3 10*3/uL — ABNORMAL HIGH (ref 4.0–10.5)
nRBC: 0 % (ref 0.0–0.2)

## 2019-08-25 MED ORDER — SODIUM CHLORIDE 0.9% FLUSH
3.0000 mL | Freq: Once | INTRAVENOUS | Status: AC
  Administered 2019-08-25: 3 mL via INTRAVENOUS
  Filled 2019-08-25: qty 3

## 2019-08-25 NOTE — ED Triage Notes (Signed)
Pt has a hx of pancreatitis and is c/o abd pain with vomiting since around 3pm today

## 2019-08-26 LAB — LIPASE, BLOOD: Lipase: 30 U/L (ref 11–51)

## 2019-08-26 LAB — COMPREHENSIVE METABOLIC PANEL
ALT: 22 U/L (ref 0–44)
AST: 18 U/L (ref 15–41)
Albumin: 4.3 g/dL (ref 3.5–5.0)
Alkaline Phosphatase: 71 U/L (ref 38–126)
Anion gap: 8 (ref 5–15)
BUN: 14 mg/dL (ref 6–20)
CO2: 21 mmol/L — ABNORMAL LOW (ref 22–32)
Calcium: 9.2 mg/dL (ref 8.9–10.3)
Chloride: 108 mmol/L (ref 98–111)
Creatinine, Ser: 0.79 mg/dL (ref 0.61–1.24)
GFR calc Af Amer: >60 mL/min (ref >60)
GFR calc non Af Amer: >60 mL/min (ref >60)
Glucose, Bld: 109 mg/dL — ABNORMAL HIGH (ref 70–99)
Potassium: 3.6 mmol/L (ref 3.5–5.1)
Sodium: 137 mmol/L (ref 135–145)
Total Bilirubin: 0.7 mg/dL (ref 0.3–1.2)
Total Protein: 7.2 g/dL (ref 6.5–8.1)

## 2019-08-26 MED ORDER — HYDROMORPHONE HCL 1 MG/ML IJ SOLN
1.0000 mg | Freq: Once | INTRAMUSCULAR | Status: AC
  Administered 2019-08-26: 1 mg via INTRAVENOUS
  Filled 2019-08-26: qty 1

## 2019-08-26 MED ORDER — ONDANSETRON HCL 4 MG/2ML IJ SOLN
4.0000 mg | Freq: Once | INTRAMUSCULAR | Status: AC
  Administered 2019-08-26: 4 mg via INTRAVENOUS
  Filled 2019-08-26: qty 2

## 2019-08-26 MED ORDER — OXYCODONE HCL 10 MG PO TABS: ORAL_TABLET | ORAL | 0 refills | Status: DC

## 2019-08-26 MED ORDER — OXYCODONE HCL 5 MG PO TABS
10.0000 mg | ORAL_TABLET | Freq: Once | ORAL | Status: AC
  Administered 2019-08-26: 10 mg via ORAL
  Filled 2019-08-26: qty 2

## 2019-08-26 NOTE — ED Notes (Signed)
Pt provided with ginger ale and crackers

## 2019-08-26 NOTE — ED Provider Notes (Signed)
[facility] EMERGENCY DEPARTMENT Provider Note   CSN: [id number] Arrival date & time: 08/25/19  2257     History Chief Complaint  Patient presents with  . Abdominal Pain  . Emesis    [name] is a 44 y.o. male.  HPI    44 year old male with history of chronic pancreatitis comes in a chief complaint of abdominal pain. Patient reports that he started having his current pain this afternoon.  He has taken his home pain medications without significant relief.  The pain is epigastric and radiating to the back, similar to his prior pancreatitis flareup.  He had iatrogenic pancreatitis and reports intermittent flareups, with last severe flareup being 5 to 6 months ago.  Patient also reports nausea with vomiting.  Past Medical History:  Diagnosis Date  . Chronic abdominal pain   . Diverticulitis   . Drug-seeking behavior   . Hypertension   . Kidney stones   . Pancreatitis, chronic ([place]) 05/28/2005    Patient Active Problem List   Diagnosis Date Noted  . Chronic pain disorder 08/29/2018  . Back pain, chronic 08/29/2018  . Nausea and vomiting in adult 02/25/2018  . Hypertension, essential, benign 01/18/2018  . Chronic biliary pancreatitis ([place]) 01/18/2018  . Chronic abdominal pain 01/14/2018    Past Surgical History:  Procedure Laterality Date  . APPENDECTOMY    . CHOLECYSTECTOMY    . ERCP W/ METAL STENT PLACEMENT    . KIDNEY STONE SURGERY         Family History  Problem Relation Age of Onset  . Cancer Mother   . Early death Mother   . Hypertension Mother   . Heart failure Father   . Hypertension Father   . Heart disease Father   . Hyperlipidemia Father   . Pancreatic cancer Paternal Grandmother        possibly  . Pancreatic cancer Paternal Aunt   . Colon cancer Neg Hx     Social History   Tobacco Use  . Smoking status: Current Every Day Smoker    Packs/day: 0.50    Types: Cigarettes  . Smokeless tobacco: Never Used  Substance Use  Topics  . Alcohol use: Not Currently    Comment: rarely, pt stated that he drinks once ever 3 months and it is usually a beer at dinner  . Drug use: No    Home Medications Prior to Admission medications   Medication Sig Start Date End Date Taking? Authorizing Provider  amLODipine-benazepril (LOTREL) 5-20 MG capsule TAKE 1 CAPSULE BY MOUTH EVERY DAY 05/12/19   [place], Betty G, MD  fluticasone [facility]) 50 MCG/ACT nasal spray Place 1 spray into both nostrils 2 (two) times daily. 04/03/19   [place], Betty G, MD  metoprolol succinate (TOPROL-XL) 100 MG 24 hr tablet Take 1 tablet (100 mg total) by mouth daily. Take with or immediately following a meal. 12/27/18   [place], Betty G, MD  naloxone [facility]) nasal spray 4 mg/0.1 mL 1 spray in each nostril x 1 if opioid overdose. 03/23/19   [place], Betty G, MD  omeprazole (PRILOSEC) 20 MG capsule Take 1 capsule (20 mg total) by mouth daily. 03/23/19   Horton, Barbette Hair, MD  ondansetron (ZOFRAN ODT) 8 MG disintegrating tablet Take 1 tablet (8 mg total) by mouth every 8 (eight) hours as needed for nausea or vomiting. 06/24/19   [place], Betty G, MD  oxyCODONE ER [facility]) 9 MG C12A Take 1 tablet by mouth 2 (two) times daily. 08/07/19  [place], Betty G, MD  Oxycodone HCl 10 MG TABS 1 tablet twice daily as needed, can take an extra tablet if needed.  No more than 70 tablets/month. 07/29/19   [place], Betty G, MD  promethazine (PHENERGAN) 25 MG suppository Place 1 suppository (25 mg total) rectally every 8 (eight) hours as needed for nausea or vomiting. 02/20/19   [place], Betty G, MD  promethazine (PHENERGAN) 25 MG tablet TAKE 1 TABLET (25 MG TOTAL) BY MOUTH EVERY 12 (TWELVE) HOURS AS NEEDED. 06/26/19   [place], Betty G, MD  sucralfate (CARAFATE) 1 g tablet Take 1 tablet (1 g total) by mouth 4 (four) times daily -  with meals and at bedtime. 03/23/19   Horton, Barbette Hair, MD    Allergies    Cortisone, Eggs or egg-derived products, Ketorolac tromethamine, Haloperidol,  Hydrocortisone, Iodinated diagnostic agents, Ketorolac tromethamine, Haldol [haloperidol lactate], Ketorolac, and Reglan [metoclopramide]  Review of Systems   Review of Systems  Constitutional: Positive for activity change.  Respiratory: Negative for shortness of breath.   Cardiovascular: Negative for chest pain.  Gastrointestinal: Positive for abdominal pain, nausea and vomiting.  Genitourinary: Negative for dysuria.  All other systems reviewed and are negative.   Physical Exam Updated Vital Signs BP (!) 139/99 (BP Location: Left Arm)   Pulse 92   Temp 98.5 F (36.9 C) (Oral)   Resp 18   Ht 6\' 1"  (1.854 m)   Wt 97.5 kg   SpO2 97%   BMI 28.37 kg/m   Physical Exam Vitals and nursing note reviewed.  Constitutional:      Appearance: He is well-developed.  HENT:     Head: Normocephalic and atraumatic.  Eyes:     Conjunctiva/sclera: Conjunctivae normal.     Pupils: Pupils are equal, round, and reactive to light.  Cardiovascular:     Rate and Rhythm: Normal rate and regular rhythm.  Pulmonary:     Effort: Pulmonary effort is normal.     Breath sounds: Normal breath sounds.  Abdominal:     General: Bowel sounds are normal. There is no distension.     Palpations: Abdomen is soft. There is no mass.     Tenderness: There is abdominal tenderness in the epigastric area. There is no guarding or rebound.  Musculoskeletal:        General: No deformity.     Cervical back: Normal range of motion and neck supple.  Skin:    General: Skin is warm.  Neurological:     Mental Status: He is alert and oriented to person, place, and time.     ED Results / Procedures / Treatments   Labs (all labs ordered are listed, but only abnormal results are displayed) Labs Reviewed  COMPREHENSIVE METABOLIC PANEL - Abnormal; Notable for the following components:      Result Value   CO2 21 (*)    Glucose, Bld 109 (*)    All other components within normal limits  CBC - Abnormal; Notable for the  following components:   WBC 11.3 (*)    All other components within normal limits  LIPASE, BLOOD  URINALYSIS, ROUTINE W REFLEX MICROSCOPIC    EKG None  Radiology No results found.  Procedures Procedures (including critical care time)  Medications Ordered in ED Medications  sodium chloride flush (NS) 0.9 % injection 3 mL (3 mLs Intravenous Given 08/25/19 2352)  HYDROmorphone (DILAUDID) injection 1 mg (1 mg Intravenous Given 08/26/19 0017)  ondansetron (ZOFRAN) injection 4 mg (4 mg Intravenous Given 08/26/19  0016)  oxyCODONE (Oxy IR/ROXICODONE) immediate release tablet 10 mg (10 mg Oral Given 08/26/19 0122)    ED Course  I have reviewed the triage vital signs and the nursing notes.  Pertinent labs & imaging results that were available during my care of the patient were reviewed by me and considered in my medical decision making (see chart for details).    MDM Rules/Calculators/A&P                      DDx includes: Pancreatitis Hepatobiliary pathology including cholecystitis Gastritis/PUD SBO ACS syndrome  Patient comes in a chief complaint of epigastric abdominal pain radiating to the back. He has history of chronic pancreatitis and reports that his pain is similar to his prior pancreatitis flareup.  Exam is overall reassuring as the abdomen is soft.  Patient given 1 round of IV hydromorphone.  Chart review reveals that he does have pain specialist that is managing his chronic pain.  Patient has not contacted them prior to ED arrival.  His labs are reassuring, we will proceed to oral pain medications, as the abdominal exam itself is reassuring.  P.o. challenge also initiated.  Once patient passes oral challenge she will be discharged.    Final Clinical Impression(s) / ED Diagnoses Final diagnoses:  Abdominal pain, chronic, epigastric    Rx / DC Orders ED Discharge Orders    None       Varney Biles, MD 08/26/19 [id number]

## 2019-08-26 NOTE — Discharge Instructions (Signed)
We saw you in the ER for the abdominal pain. All the results in the ER are normal. We are not sure what is causing your symptoms -it could be mild flareup of your chronic pancreatitis.  Your pancreatic enzymes are normal however.  The workup in the ER is not complete, and is limited to screening for life threatening and emergent conditions only, so please see a primary care doctor for further evaluation.

## 2019-08-27 ENCOUNTER — Encounter: Payer: Self-pay | Admitting: Family Medicine

## 2019-08-28 ENCOUNTER — Other Ambulatory Visit: Payer: Self-pay | Admitting: Family Medicine

## 2019-08-28 DIAGNOSIS — K861 Other chronic pancreatitis: Secondary | ICD-10-CM

## 2019-08-28 DIAGNOSIS — R112 Nausea with vomiting, unspecified: Secondary | ICD-10-CM

## 2019-09-07 ENCOUNTER — Other Ambulatory Visit: Payer: Self-pay | Admitting: Family Medicine

## 2019-09-07 DIAGNOSIS — G894 Chronic pain syndrome: Secondary | ICD-10-CM

## 2019-09-08 MED ORDER — XTAMPZA ER 9 MG PO C12A: 1.0000 | EXTENDED_RELEASE_CAPSULE | Freq: Two times a day (BID) | ORAL | 0 refills | Status: DC

## 2019-09-22 ENCOUNTER — Other Ambulatory Visit: Payer: Self-pay | Admitting: Family Medicine

## 2019-09-22 DIAGNOSIS — G8929 Other chronic pain: Secondary | ICD-10-CM

## 2019-09-22 DIAGNOSIS — G894 Chronic pain syndrome: Secondary | ICD-10-CM

## 2019-09-22 MED ORDER — OXYCODONE HCL 10 MG PO TABS: ORAL_TABLET | ORAL | 0 refills | Status: DC

## 2019-09-23 ENCOUNTER — Telehealth: Payer: Self-pay | Admitting: Family Medicine

## 2019-09-23 NOTE — Telephone Encounter (Signed)
LVM to set up follow up appt per [place]

## 2019-09-23 NOTE — Telephone Encounter (Signed)
-----   Message from Beverlee Nims sent at 09/22/2019  4:53 PM EDT ----- Please arrange f/u visit for pain management. If possible in the office. Thanks, BJ

## 2019-09-25 ENCOUNTER — Other Ambulatory Visit: Payer: Self-pay

## 2019-09-27 ENCOUNTER — Other Ambulatory Visit: Payer: Self-pay

## 2019-09-27 ENCOUNTER — Encounter (HOSPITAL_BASED_OUTPATIENT_CLINIC_OR_DEPARTMENT_OTHER): Payer: Self-pay | Admitting: Emergency Medicine

## 2019-09-27 ENCOUNTER — Emergency Department (HOSPITAL_BASED_OUTPATIENT_CLINIC_OR_DEPARTMENT_OTHER)
Admission: EM | Admit: 2019-09-27 | Discharge: 2019-09-27 | Disposition: A | Discharge status: Home / Self Care | Payer: BLUE CROSS/BLUE SHIELD | Attending: Emergency Medicine | Admitting: Emergency Medicine

## 2019-09-27 DIAGNOSIS — G8929 Other chronic pain: Secondary | ICD-10-CM | POA: Diagnosis not present

## 2019-09-27 DIAGNOSIS — F17210 Nicotine dependence, cigarettes, uncomplicated: Secondary | ICD-10-CM | POA: Diagnosis not present

## 2019-09-27 DIAGNOSIS — Z79899 Other long term (current) drug therapy: Secondary | ICD-10-CM | POA: Diagnosis not present

## 2019-09-27 DIAGNOSIS — Z888 Allergy status to other drugs, medicaments and biological substances status: Secondary | ICD-10-CM | POA: Insufficient documentation

## 2019-09-27 DIAGNOSIS — Z885 Allergy status to narcotic agent status: Secondary | ICD-10-CM | POA: Diagnosis not present

## 2019-09-27 DIAGNOSIS — I10 Essential (primary) hypertension: Secondary | ICD-10-CM | POA: Insufficient documentation

## 2019-09-27 DIAGNOSIS — R1012 Left upper quadrant pain: Secondary | ICD-10-CM | POA: Insufficient documentation

## 2019-09-27 DIAGNOSIS — Z91012 Allergy to eggs: Secondary | ICD-10-CM | POA: Diagnosis not present

## 2019-09-27 DIAGNOSIS — R112 Nausea with vomiting, unspecified: Secondary | ICD-10-CM | POA: Diagnosis not present

## 2019-09-27 DIAGNOSIS — R109 Unspecified abdominal pain: Secondary | ICD-10-CM

## 2019-09-27 LAB — COMPREHENSIVE METABOLIC PANEL
ALT: 23 U/L (ref 0–44)
AST: 19 U/L (ref 15–41)
Albumin: 4.1 g/dL (ref 3.5–5.0)
Alkaline Phosphatase: 68 U/L (ref 38–126)
Anion gap: 9 (ref 5–15)
BUN: 14 mg/dL (ref 6–20)
CO2: 24 mmol/L (ref 22–32)
Calcium: 9 mg/dL (ref 8.9–10.3)
Chloride: 106 mmol/L (ref 98–111)
Creatinine, Ser: 0.81 mg/dL (ref 0.61–1.24)
GFR calc Af Amer: >60 mL/min (ref >60)
GFR calc non Af Amer: >60 mL/min (ref >60)
Glucose, Bld: 91 mg/dL (ref 70–99)
Potassium: 3.3 mmol/L — ABNORMAL LOW (ref 3.5–5.1)
Sodium: 139 mmol/L (ref 135–145)
Total Bilirubin: 0.4 mg/dL (ref 0.3–1.2)
Total Protein: 7 g/dL (ref 6.5–8.1)

## 2019-09-27 LAB — CBC WITH DIFFERENTIAL/PLATELET
Abs Immature Granulocytes: 0.05 10*3/uL (ref 0.00–0.07)
Basophils Absolute: 0.1 10*3/uL (ref 0.0–0.1)
Basophils Relative: 1 %
Eosinophils Absolute: 0.3 10*3/uL (ref 0.0–0.5)
Eosinophils Relative: 3 %
HCT: 44.7 % (ref 39.0–52.0)
Hemoglobin: 16.1 g/dL (ref 13.0–17.0)
Immature Granulocytes: 0 %
Lymphocytes Relative: 27 %
Lymphs Abs: 3.1 10*3/uL (ref 0.7–4.0)
MCH: 30.5 pg (ref 26.0–34.0)
MCHC: 36 g/dL (ref 30.0–36.0)
MCV: 84.7 fL (ref 80.0–100.0)
Monocytes Absolute: 1.1 10*3/uL — ABNORMAL HIGH (ref 0.1–1.0)
Monocytes Relative: 9 %
Neutro Abs: 6.9 10*3/uL (ref 1.7–7.7)
Neutrophils Relative %: 60 %
Platelets: 215 10*3/uL (ref 150–400)
RBC: 5.28 MIL/uL (ref 4.22–5.81)
RDW: 12.5 % (ref 11.5–15.5)
WBC: 11.4 10*3/uL — ABNORMAL HIGH (ref 4.0–10.5)
nRBC: 0 % (ref 0.0–0.2)

## 2019-09-27 LAB — LIPASE, BLOOD: Lipase: 27 U/L (ref 11–51)

## 2019-09-27 MED ORDER — HYDROMORPHONE HCL 1 MG/ML IJ SOLN
1.0000 mg | Freq: Once | INTRAMUSCULAR | Status: AC
  Administered 2019-09-27: 1 mg via INTRAVENOUS
  Filled 2019-09-27: qty 1

## 2019-09-27 MED ORDER — SODIUM CHLORIDE 0.9 % IV BOLUS
1000.0000 mL | Freq: Once | INTRAVENOUS | Status: AC
  Administered 2019-09-27: 1000 mL via INTRAVENOUS

## 2019-09-27 MED ORDER — ONDANSETRON HCL 4 MG/2ML IJ SOLN
4.0000 mg | Freq: Once | INTRAMUSCULAR | Status: AC
  Administered 2019-09-27: 4 mg via INTRAVENOUS
  Filled 2019-09-27: qty 2

## 2019-09-27 MED ORDER — DIPHENHYDRAMINE HCL 50 MG/ML IJ SOLN
25.0000 mg | Freq: Once | INTRAMUSCULAR | Status: AC
  Administered 2019-09-27: 25 mg via INTRAVENOUS
  Filled 2019-09-27: qty 1

## 2019-09-27 MED ORDER — PROMETHAZINE HCL 25 MG/ML IJ SOLN
12.5000 mg | Freq: Once | INTRAMUSCULAR | Status: AC
  Administered 2019-09-27: 12.5 mg via INTRAVENOUS
  Filled 2019-09-27: qty 1

## 2019-09-27 NOTE — ED Triage Notes (Signed)
Pt arrives with "flare of my pancreatitis" states since 0800 yesterday. States home meds (oxycodone, xtampza, phenergan and zofran) not effective for symptoms. Hx of same.

## 2019-09-27 NOTE — ED Provider Notes (Signed)
[place] EMERGENCY DEPARTMENT Provider Note   CSN: [id number] Arrival date & time: 09/27/19  0300     History Chief Complaint  Patient presents with  . Abdominal Pain    [name] is a 44 y.o. male.  Patient presents to the emergency department for evaluation of left upper abdominal pain.  He has been experiencing pain since yesterday.  He has used his home medications without improvement.  He reports sharp pain in the left upper abdomen that radiates into the back associated with nausea and vomiting.  Reports that this is similar to pain he has had with his chronic pancreatitis.        Past Medical History:  Diagnosis Date  . Chronic abdominal pain   . Diverticulitis   . Drug-seeking behavior   . Hypertension   . Kidney stones   . Pancreatitis, chronic ([place]) 05/28/2005    Patient Active Problem List   Diagnosis Date Noted  . Chronic pain disorder 08/29/2018  . Back pain, chronic 08/29/2018  . Nausea and vomiting in adult 02/25/2018  . Hypertension, essential, benign 01/18/2018  . Chronic biliary pancreatitis ([place]) 01/18/2018  . Chronic abdominal pain 01/14/2018    Past Surgical History:  Procedure Laterality Date  . APPENDECTOMY    . CHOLECYSTECTOMY    . ERCP W/ METAL STENT PLACEMENT    . KIDNEY STONE SURGERY         Family History  Problem Relation Age of Onset  . Cancer Mother   . Early death Mother   . Hypertension Mother   . Heart failure Father   . Hypertension Father   . Heart disease Father   . Hyperlipidemia Father   . Pancreatic cancer Paternal Grandmother        possibly  . Pancreatic cancer Paternal Aunt   . Colon cancer Neg Hx     Social History   Tobacco Use  . Smoking status: Current Every Day Smoker    Packs/day: 0.50    Types: Cigarettes  . Smokeless tobacco: Never Used  Substance Use Topics  . Alcohol use: Not Currently    Comment: rarely, pt stated that he drinks once ever 3 months and it is usually a  beer at dinner  . Drug use: No    Home Medications Prior to Admission medications   Medication Sig Start Date End Date Taking? Authorizing Provider  amLODipine-benazepril (LOTREL) 5-20 MG capsule TAKE 1 CAPSULE BY MOUTH EVERY DAY 05/12/19   [place], Betty G, MD  fluticasone [facility]) 50 MCG/ACT nasal spray Place 1 spray into both nostrils 2 (two) times daily. 04/03/19   [place], Betty G, MD  metoprolol succinate (TOPROL-XL) 100 MG 24 hr tablet Take 1 tablet (100 mg total) by mouth daily. Take with or immediately following a meal. 12/27/18   [place], Betty G, MD  naloxone [facility]) nasal spray 4 mg/0.1 mL 1 spray in each nostril x 1 if opioid overdose. 03/23/19   [place], Betty G, MD  omeprazole (PRILOSEC) 20 MG capsule Take 1 capsule (20 mg total) by mouth daily. 03/23/19   Horton, Barbette Hair, MD  ondansetron (ZOFRAN ODT) 8 MG disintegrating tablet Take 1 tablet (8 mg total) by mouth every 8 (eight) hours as needed for nausea or vomiting. 06/24/19   [place], Betty G, MD  oxyCODONE ER [facility] ER) 9 MG C12A Take 1 tablet by mouth 2 (two) times daily. 09/08/19   [place], Betty G, MD  Oxycodone HCl 10 MG TABS 1 tablet twice  daily as needed, can take an extra tablet if needed.  No more than 70 tablets/month. 09/22/19   [place], Betty G, MD  promethazine (PHENERGAN) 25 MG suppository Place 1 suppository (25 mg total) rectally every 8 (eight) hours as needed for nausea or vomiting. 02/20/19   [place], Betty G, MD  promethazine (PHENERGAN) 25 MG tablet TAKE 1 TABLET (25 MG TOTAL) BY MOUTH EVERY 12 (TWELVE) HOURS AS NEEDED. 08/31/19   [place], Betty G, MD  sucralfate (CARAFATE) 1 g tablet Take 1 tablet (1 g total) by mouth 4 (four) times daily -  with meals and at bedtime. 03/23/19   Horton, Barbette Hair, MD    Allergies    Cortisone, Eggs or egg-derived products, Ketorolac tromethamine, Haloperidol, Hydrocortisone, Iodinated diagnostic agents, Ketorolac tromethamine, Haldol [haloperidol lactate], Ketorolac, and Reglan  [metoclopramide]  Review of Systems   Review of Systems  Gastrointestinal: Positive for abdominal pain, nausea and vomiting.  All other systems reviewed and are negative.   Physical Exam Updated Vital Signs BP (!) 150/112   Pulse 92   Temp 99.4 F (37.4 C) (Oral)   Resp 18   Ht 6\' 1"  (1.854 m)   Wt 99.3 kg   SpO2 99%   BMI 28.89 kg/m   Physical Exam Vitals and nursing note reviewed.  Constitutional:      General: He is not in acute distress.    Appearance: Normal appearance. He is well-developed.  HENT:     Head: Normocephalic and atraumatic.     Right Ear: Hearing normal.     Left Ear: Hearing normal.     Nose: Nose normal.  Eyes:     Conjunctiva/sclera: Conjunctivae normal.     Pupils: Pupils are equal, round, and reactive to light.  Cardiovascular:     Rate and Rhythm: Regular rhythm.     Heart sounds: S1 normal and S2 normal. No murmur. No friction rub. No gallop.   Pulmonary:     Effort: Pulmonary effort is normal. No respiratory distress.     Breath sounds: Normal breath sounds.  Chest:     Chest wall: No tenderness.  Abdominal:     General: Bowel sounds are normal.     Palpations: Abdomen is soft.     Tenderness: There is abdominal tenderness in the left upper quadrant. There is no guarding or rebound. Negative signs include Murphy's sign and McBurney's sign.     Hernia: No hernia is present.  Musculoskeletal:        General: Normal range of motion.     Cervical back: Normal range of motion and neck supple.  Skin:    General: Skin is warm and dry.     Findings: No rash.  Neurological:     Mental Status: He is alert and oriented to person, place, and time.     GCS: GCS eye subscore is 4. GCS verbal subscore is 5. GCS motor subscore is 6.     Cranial Nerves: No cranial nerve deficit.     Sensory: No sensory deficit.     Coordination: Coordination normal.  Psychiatric:        Speech: Speech normal.        Behavior: Behavior normal.        Thought  Content: Thought content normal.     ED Results / Procedures / Treatments   Labs (all labs ordered are listed, but only abnormal results are displayed) Labs Reviewed  CBC WITH DIFFERENTIAL/PLATELET - Abnormal; Notable for the following components:  Result Value   WBC 11.4 (*)    Monocytes Absolute 1.1 (*)    All other components within normal limits  COMPREHENSIVE METABOLIC PANEL  LIPASE, BLOOD    EKG None  Radiology No results found.  Procedures Procedures (including critical care time)  Medications Ordered in ED Medications  sodium chloride 0.9 % bolus 1,000 mL (1,000 mLs Intravenous New Bag/Given 09/27/19 0544)  ondansetron (ZOFRAN) injection 4 mg (4 mg Intravenous Given 09/27/19 0515)  HYDROmorphone (DILAUDID) injection 1 mg (1 mg Intravenous Given 09/27/19 0515)  diphenhydrAMINE (BENADRYL) injection 25 mg (25 mg Intravenous Given 09/27/19 0515)  promethazine (PHENERGAN) injection 12.5 mg (12.5 mg Intravenous Given 09/27/19 0515)    ED Course  I have reviewed the triage vital signs and the nursing notes.  Pertinent labs & imaging results that were available during my care of the patient were reviewed by me and considered in my medical decision making (see chart for details).    MDM Rules/Calculators/A&P                      Patient with history of chronic abdominal pain and chronic pancreatitis presents to the emergency department with complaints of pain, nausea and vomiting.  He is afebrile vital signs are unremarkable.  Abdominal exam reveals tenderness to the left upper quadrant without guarding or rebound.  No concern for acute surgical process.  Lab work is reassuring.  Patient treated with analgesia IV fluids, antiemetics.  Final Clinical Impression(s) / ED Diagnoses Final diagnoses:  Chronic abdominal pain    Rx / DC Orders ED Discharge Orders    None       Hewitt Garner, Gwenyth Allegra, MD 09/27/19 [phone]

## 2019-09-28 ENCOUNTER — Encounter: Payer: Self-pay | Admitting: Family Medicine

## 2019-09-28 ENCOUNTER — Ambulatory Visit (INDEPENDENT_AMBULATORY_CARE_PROVIDER_SITE_OTHER): Payer: BLUE CROSS/BLUE SHIELD | Admitting: Family Medicine

## 2019-09-28 VITALS — BP 132/84 | HR 76 | Temp 98.3°F | Resp 12 | Ht 73.0 in | Wt 210.0 lb

## 2019-09-28 DIAGNOSIS — F1190 Opioid use, unspecified, uncomplicated: Secondary | ICD-10-CM | POA: Diagnosis not present

## 2019-09-28 DIAGNOSIS — L259 Unspecified contact dermatitis, unspecified cause: Secondary | ICD-10-CM | POA: Diagnosis not present

## 2019-09-28 DIAGNOSIS — G894 Chronic pain syndrome: Secondary | ICD-10-CM

## 2019-09-28 DIAGNOSIS — R112 Nausea with vomiting, unspecified: Secondary | ICD-10-CM

## 2019-09-28 DIAGNOSIS — I10 Essential (primary) hypertension: Secondary | ICD-10-CM

## 2019-09-28 DIAGNOSIS — K861 Other chronic pancreatitis: Secondary | ICD-10-CM

## 2019-09-28 MED ORDER — CLOBETASOL PROPIONATE 0.05 % EX CREA: 1.0000 "application " | TOPICAL_CREAM | Freq: Two times a day (BID) | CUTANEOUS | 0 refills | Status: AC

## 2019-09-28 NOTE — Progress Notes (Signed)
HPI:  [name] is a 44 y.o. male, who is here today for 3-4 months follow up.   He was last seen on 06/23/19, virtual visit.  -Chronic pain disorder, abdominal and back pain. He is on Oxycodone ER 9 mg bid and for break through pain he takes Oxycodone 10 mg tid as needed max 70 tabs per month. He has nasal Naloxone at home. Chronic nausea and vomiting,he is on Promethazine 25 mg oral and suppositories.  Upper abdominal pain is still severe,intermittent, he has not identified exacerbating factors.  Since his last visit he has been in the ER 2 times due to sever pain. ER visits have decreased sine he has been on current regiment. No side effects reported.  Lab Results  Component Value Date   CHOL 207 (H) 06/03/2015   HDL 40 06/03/2015   LDLCALC 113 (H) 06/03/2015   TRIG 270 (H) 06/03/2015   CHOLHDL 5.2 (H) 06/03/2015   -Rash on LE's, pruritic sometimes. He has had problem for about a months. Gradually improving. Not sure about exacerbating or alleviating factor.  He denies any new medication, detergent, soap, or body product. No known insect bite or outdoor exposures to plants. No sick contact. No Hx of eczema or similar rash in the past.  OTC medication for this problem: Lotion  Negative for oral lesions/edema,cough, wheezing, dyspnea,or changes in bowel habits.  HTN: He is on Amlodipine-Benazepril 5-20 mg daily and metoprolol Succinate 100 mg daily. Negative for severe/frequent headache, visual changes, chest pain, dyspnea, palpitation, claudication, focal weakness, or edema.  Lab Results  Component Value Date   CREATININE 0.81 09/27/2019   BUN 14 09/27/2019   NA 139 09/27/2019   K 3.3 (L) 09/27/2019   CL 106 09/27/2019   CO2 24 09/27/2019    Review of Systems  Constitutional: Negative for activity change, appetite change, fatigue and fever.  HENT: Negative for nosebleeds and sore throat.   Eyes: Negative for redness and visual disturbance.    Genitourinary: Negative for decreased urine volume, dysuria and hematuria.  Musculoskeletal: Positive for back pain. Negative for gait problem and joint swelling.  Skin: Negative for wound.  Allergic/Immunologic: Positive for environmental allergies.  Neurological: Negative for syncope and weakness.  Psychiatric/Behavioral: Negative for confusion. The patient is nervous/anxious.   Rest of ROS, see pertinent positives sand negatives in HPI  Current Outpatient Medications on File Prior to Visit  Medication Sig Dispense Refill  . amLODipine-benazepril (LOTREL) 5-20 MG capsule TAKE 1 CAPSULE BY MOUTH EVERY DAY 90 capsule 1  . fluticasone (FLONASE) 50 MCG/ACT nasal spray Place 1 spray into both nostrils 2 (two) times daily. 16 g 2  . metoprolol succinate (TOPROL-XL) 100 MG 24 hr tablet Take 1 tablet (100 mg total) by mouth daily. Take with or immediately following a meal. 90 tablet 2  . naloxone (NARCAN) nasal spray 4 mg/0.1 mL 1 spray in each nostril x 1 if opioid overdose. 1 each 1  . omeprazole (PRILOSEC) 20 MG capsule Take 1 capsule (20 mg total) by mouth daily. 30 capsule 0  . ondansetron (ZOFRAN ODT) 8 MG disintegrating tablet Take 1 tablet (8 mg total) by mouth every 8 (eight) hours as needed for nausea or vomiting. 20 tablet 1  . oxyCODONE ER (XTAMPZA ER) 9 MG C12A Take 1 tablet by mouth 2 (two) times daily. 60 capsule 0  . Oxycodone HCl 10 MG TABS 1 tablet twice daily as needed, can take an extra tablet if needed.  No more than 70 tablets/month. 70 tablet 0  . promethazine (PHENERGAN) 25 MG suppository Place 1 suppository (25 mg total) rectally every 8 (eight) hours as needed for nausea or vomiting. 12 each 2  . promethazine (PHENERGAN) 25 MG tablet TAKE 1 TABLET (25 MG TOTAL) BY MOUTH EVERY 12 (TWELVE) HOURS AS NEEDED. 45 tablet 1  . sucralfate (CARAFATE) 1 g tablet Take 1 tablet (1 g total) by mouth 4 (four) times daily -  with meals and at bedtime. 60 tablet 0   No current  facility-administered medications on file prior to visit.     Past Medical History:  Diagnosis Date  . Chronic abdominal pain   . Diverticulitis   . Drug-seeking behavior   . Hypertension   . Kidney stones   . Pancreatitis, chronic ([place]) 05/28/2005   Allergies  Allergen Reactions  . Cortisone Other (See Comments)    drops potassium level "bottoms out" potassium level drops potassium level Bottoms out potassium  "bottoms out" potassium level Other reaction(s): Other (See Comments) Bottoms out potassium  . Eggs Or Egg-Derived Products Hives and Other (See Comments)    Rash  Rash  . Ketorolac Hives and Other (See Comments)    Hives, slightly labored breathing  Hives, slightly labored breathing  . Ketorolac Tromethamine Hives and Shortness Of Breath  . Haldol [Haloperidol Lactate] Other (See Comments)    "jittery"   . Haloperidol Other (See Comments)    "jittery" "jittery"   . Hydrocortisone Hives    Also drops potassium level  . Iodinated Diagnostic Agents Nausea And Vomiting and Other (See Comments)    Hives Hives Hives Hives  . Ketorolac Tromethamine Hives  . Reglan [Metoclopramide]     Face "draws" and gets figgety    Social History   Socioeconomic History  . Marital status: Married    Spouse name: Not on file  . Number of children: Not on file  . Years of education: Not on file  . Highest education level: Not on file  Occupational History  . Not on file  Tobacco Use  . Smoking status: Current Every Day Smoker    Packs/day: 0.50    Types: Cigarettes  . Smokeless tobacco: Never Used  Substance and Sexual Activity  . Alcohol use: Not Currently    Comment: rarely, pt stated that he drinks once ever 3 months and it is usually a beer at dinner  . Drug use: No  . Sexual activity: Yes    Partners: Female  Other Topics Concern  . Not on file  Social History Narrative  . Not on file   Social Determinants of Health   Financial Resource Strain:     . Difficulty of Paying Living Expenses:   Food Insecurity:   . Worried About Charity fundraiser in the Last Year:   . Arboriculturist in the Last Year:   Transportation Needs:   . Film/video editor (Medical):   Marland Kitchen Lack of Transportation (Non-Medical):   Physical Activity:   . Days of Exercise per Week:   . Minutes of Exercise per Session:   Stress:   . Feeling of Stress :   Social Connections:   . Frequency of Communication with Friends and Family:   . Frequency of Social Gatherings with Friends and Family:   . Attends Religious Services:   . Active Member of Clubs or Organizations:   . Attends Archivist Meetings:   Marland Kitchen Marital Status:  Vitals:   09/28/19 0746  BP: 132/84  Pulse: 76  Resp: 12  Temp: 98.3 F (36.8 C)  SpO2: 96%   Body mass index is 27.71 kg/m.  Physical Exam  Nursing note and vitals reviewed. Constitutional: He is oriented to person, place, and time. He appears well-developed. No distress.  HENT:  Head: Normocephalic and atraumatic.  Mouth/Throat: Oropharynx is clear and moist and mucous membranes are normal. He has dentures.  Eyes: Pupils are equal, round, and reactive to light. Conjunctivae are normal.  Cardiovascular: Normal rate and regular rhythm.  No murmur heard. Pulses:      Dorsalis pedis pulses are 2+ on the right side and 2+ on the left side.  Respiratory: Effort normal and breath sounds normal. No respiratory distress.  GI: Soft. He exhibits no mass. There is no hepatomegaly. There is no abdominal tenderness.  Musculoskeletal:        General: No edema.  Lymphadenopathy:    He has no cervical adenopathy.  Neurological: He is alert and oriented to person, place, and time. He has normal strength. No cranial nerve deficit. Gait normal.  Skin: Skin is warm. Rash (On pretibial area,bilateral.) noted. Rash is not vesicular. No erythema.     Psychiatric: He has a normal mood and affect. Cognition and memory are normal.   Well groomed, good eye contact.   ASSESSMENT AND PLAN:   Mr. Leotis Bracey was seen today for 3-4 months follow-up.  Orders Placed This Encounter  Procedures  . Pain Mgmt, Profile 8 w/Conf, U   1. Chronic, continuous use of opioids Side effects discussed. Tolerating medication well. Has Naloxone Rx.  2. Chronic pain disorder Pain is better controlled. Reasonable expectation in regard to pain discussed. Med contract signed. Urine tox ordered today. PDMP reviewed.  3. Contact dermatitis, unspecified contact dermatitis type, unspecified trigger Topical steroid recommended bid for up to 2 weeks. If not resolved we may need dermatology referral.  - clobetasol cream (TEMOVATE) 0.05 %; Apply 1 application topically 2 (two) times daily for 14 days. On legs for 14 days at the time.  Dispense: 45 g; Refill: 0  4. Nausea and vomiting in adult Stable. No changes in current management.  5. Hypertension, essential, benign BP adequately controlled. No changes in current management. Continue low salt diet. Smoking cessation encouraged, smoking 4 cig/day.   Return in about 3 months (around 12/29/2019) for Pain management and fasting labs.   Tashanda Fuhrer G. Martinique, MD  [facility]. [place] office.

## 2019-09-28 NOTE — Patient Instructions (Signed)
No changes today. Topical steroid for skin rash, it seems like contact dermatitis. Continue monitoring blood pressure.

## 2019-10-01 ENCOUNTER — Encounter: Payer: Self-pay | Admitting: Family Medicine

## 2019-10-02 LAB — DRUG MONITORING, PANEL 8 WITH CONFIRMATION, URINE
6 Acetylmorphine: NEGATIVE ng/mL (ref <10)
Alcohol Metabolites: NEGATIVE ng/mL
Alphahydroxyalprazolam: NEGATIVE ng/mL (ref <25)
Alphahydroxymidazolam: NEGATIVE ng/mL (ref <50)
Alphahydroxytriazolam: NEGATIVE ng/mL (ref <50)
Aminoclonazepam: NEGATIVE ng/mL (ref <25)
Amphetamines: NEGATIVE ng/mL (ref <500)
Benzodiazepines: POSITIVE ng/mL — AB (ref <100)
Buprenorphine, Urine: NEGATIVE ng/mL (ref <5)
Cocaine Metabolite: NEGATIVE ng/mL (ref <150)
Codeine: NEGATIVE ng/mL (ref <50)
Creatinine: >300 mg/dL
Hydrocodone: NEGATIVE ng/mL (ref <50)
Hydromorphone: 108 ng/mL — ABNORMAL HIGH (ref <50)
Hydroxyethylflurazepam: NEGATIVE ng/mL (ref <50)
Lorazepam: 1867 ng/mL — ABNORMAL HIGH (ref <50)
MDMA: NEGATIVE ng/mL (ref <500)
Marijuana Metabolite: NEGATIVE ng/mL (ref <20)
Morphine: NEGATIVE ng/mL (ref <50)
Nordiazepam: NEGATIVE ng/mL (ref <50)
Norhydrocodone: NEGATIVE ng/mL (ref <50)
Noroxycodone: 1454 ng/mL — ABNORMAL HIGH (ref <50)
Opiates: POSITIVE ng/mL — AB (ref <100)
Oxazepam: NEGATIVE ng/mL (ref <50)
Oxidant: NEGATIVE ug/mL
Oxycodone: 472 ng/mL — ABNORMAL HIGH (ref <50)
Oxycodone: POSITIVE ng/mL — AB (ref <100)
Oxymorphone: 339 ng/mL — ABNORMAL HIGH (ref <50)
Temazepam: NEGATIVE ng/mL (ref <50)
pH: 5.7 (ref 4.5–9.0)

## 2019-10-02 LAB — DM TEMPLATE

## 2019-10-05 ENCOUNTER — Other Ambulatory Visit: Payer: Self-pay | Admitting: Family Medicine

## 2019-10-05 DIAGNOSIS — G894 Chronic pain syndrome: Secondary | ICD-10-CM

## 2019-10-06 ENCOUNTER — Other Ambulatory Visit: Payer: Self-pay | Admitting: Family Medicine

## 2019-10-06 ENCOUNTER — Encounter: Payer: Self-pay | Admitting: Family Medicine

## 2019-10-06 DIAGNOSIS — G8929 Other chronic pain: Secondary | ICD-10-CM

## 2019-10-06 DIAGNOSIS — G894 Chronic pain syndrome: Secondary | ICD-10-CM

## 2019-10-06 MED ORDER — XTAMPZA ER 9 MG PO C12A: 1.0000 | EXTENDED_RELEASE_CAPSULE | Freq: Two times a day (BID) | ORAL | 0 refills | Status: DC

## 2019-10-06 MED ORDER — OXYCODONE HCL 10 MG PO TABS: ORAL_TABLET | ORAL | 0 refills | Status: DC

## 2019-10-09 DIAGNOSIS — B354 Tinea corporis: Secondary | ICD-10-CM | POA: Diagnosis not present

## 2019-10-09 DIAGNOSIS — Z00129 Encounter for routine child health examination without abnormal findings: Secondary | ICD-10-CM | POA: Diagnosis not present

## 2019-10-09 DIAGNOSIS — Z23 Encounter for immunization: Secondary | ICD-10-CM | POA: Diagnosis not present

## 2019-10-09 DIAGNOSIS — E063 Autoimmune thyroiditis: Secondary | ICD-10-CM | POA: Diagnosis not present

## 2019-10-09 DIAGNOSIS — E038 Other specified hypothyroidism: Secondary | ICD-10-CM | POA: Diagnosis not present

## 2019-10-09 DIAGNOSIS — F938 Other childhood emotional disorders: Secondary | ICD-10-CM | POA: Diagnosis not present

## 2019-10-09 DIAGNOSIS — N938 Other specified abnormal uterine and vaginal bleeding: Secondary | ICD-10-CM | POA: Diagnosis not present

## 2019-10-23 ENCOUNTER — Other Ambulatory Visit: Payer: Self-pay | Admitting: Family Medicine

## 2019-10-23 DIAGNOSIS — R112 Nausea with vomiting, unspecified: Secondary | ICD-10-CM

## 2019-10-23 DIAGNOSIS — K861 Other chronic pancreatitis: Secondary | ICD-10-CM

## 2019-11-03 ENCOUNTER — Other Ambulatory Visit: Payer: Self-pay | Admitting: Family Medicine

## 2019-11-03 DIAGNOSIS — G894 Chronic pain syndrome: Secondary | ICD-10-CM

## 2019-11-08 MED ORDER — XTAMPZA ER 9 MG PO C12A: 1.0000 | EXTENDED_RELEASE_CAPSULE | Freq: Two times a day (BID) | ORAL | 0 refills | Status: DC

## 2019-11-11 ENCOUNTER — Telehealth: Payer: Self-pay | Admitting: *Deleted

## 2019-11-11 NOTE — Telephone Encounter (Signed)
Left detailed message for patient to return call to office concerning PA for his Oxycodone ER.

## 2019-11-19 ENCOUNTER — Other Ambulatory Visit: Payer: Self-pay | Admitting: Family Medicine

## 2019-11-23 ENCOUNTER — Encounter: Payer: Self-pay | Admitting: Family Medicine

## 2019-11-23 ENCOUNTER — Other Ambulatory Visit: Payer: Self-pay | Admitting: Family Medicine

## 2019-11-23 DIAGNOSIS — G894 Chronic pain syndrome: Secondary | ICD-10-CM

## 2019-11-23 DIAGNOSIS — G8929 Other chronic pain: Secondary | ICD-10-CM

## 2019-11-23 NOTE — Telephone Encounter (Signed)
Last filled 10/30/19

## 2019-11-24 MED ORDER — OXYCODONE HCL 10 MG PO TABS: ORAL_TABLET | ORAL | 0 refills | Status: DC

## 2019-12-04 ENCOUNTER — Encounter: Payer: Self-pay | Admitting: Family Medicine

## 2019-12-04 ENCOUNTER — Other Ambulatory Visit: Payer: Self-pay | Admitting: Family Medicine

## 2019-12-04 DIAGNOSIS — G894 Chronic pain syndrome: Secondary | ICD-10-CM

## 2019-12-04 DIAGNOSIS — R112 Nausea with vomiting, unspecified: Secondary | ICD-10-CM

## 2019-12-04 DIAGNOSIS — K861 Other chronic pancreatitis: Secondary | ICD-10-CM

## 2019-12-04 MED ORDER — XTAMPZA ER 9 MG PO C12A: 1.0000 | EXTENDED_RELEASE_CAPSULE | Freq: Two times a day (BID) | ORAL | 0 refills | Status: DC

## 2019-12-07 ENCOUNTER — Other Ambulatory Visit: Payer: Self-pay

## 2019-12-07 ENCOUNTER — Encounter: Payer: Self-pay | Admitting: Family Medicine

## 2019-12-07 DIAGNOSIS — G894 Chronic pain syndrome: Secondary | ICD-10-CM

## 2019-12-07 MED ORDER — XTAMPZA ER 9 MG PO C12A: 1.0000 | EXTENDED_RELEASE_CAPSULE | Freq: Two times a day (BID) | ORAL | 0 refills | Status: DC

## 2019-12-07 NOTE — Telephone Encounter (Signed)
Patient is out of town already - [facility] [place], [place]. I will call local [facility] once they open and tell them to cancel the one you sent Friday.

## 2019-12-24 DIAGNOSIS — Z79891 Long term (current) use of opiate analgesic: Secondary | ICD-10-CM | POA: Diagnosis not present

## 2019-12-24 DIAGNOSIS — Z9089 Acquired absence of other organs: Secondary | ICD-10-CM | POA: Diagnosis not present

## 2019-12-24 DIAGNOSIS — Z91012 Allergy to eggs: Secondary | ICD-10-CM | POA: Diagnosis not present

## 2019-12-24 DIAGNOSIS — F17210 Nicotine dependence, cigarettes, uncomplicated: Secondary | ICD-10-CM | POA: Diagnosis not present

## 2019-12-24 DIAGNOSIS — R112 Nausea with vomiting, unspecified: Secondary | ICD-10-CM | POA: Diagnosis not present

## 2019-12-24 DIAGNOSIS — I10 Essential (primary) hypertension: Secondary | ICD-10-CM | POA: Diagnosis not present

## 2019-12-24 DIAGNOSIS — Z79899 Other long term (current) drug therapy: Secondary | ICD-10-CM | POA: Diagnosis not present

## 2019-12-24 DIAGNOSIS — Z886 Allergy status to analgesic agent status: Secondary | ICD-10-CM | POA: Diagnosis not present

## 2019-12-24 DIAGNOSIS — K861 Other chronic pancreatitis: Secondary | ICD-10-CM | POA: Diagnosis not present

## 2019-12-24 DIAGNOSIS — Z87442 Personal history of urinary calculi: Secondary | ICD-10-CM | POA: Diagnosis not present

## 2019-12-24 DIAGNOSIS — R109 Unspecified abdominal pain: Secondary | ICD-10-CM | POA: Diagnosis not present

## 2019-12-24 DIAGNOSIS — R1013 Epigastric pain: Secondary | ICD-10-CM | POA: Diagnosis not present

## 2019-12-24 DIAGNOSIS — Z9049 Acquired absence of other specified parts of digestive tract: Secondary | ICD-10-CM | POA: Diagnosis not present

## 2019-12-25 ENCOUNTER — Encounter (HOSPITAL_BASED_OUTPATIENT_CLINIC_OR_DEPARTMENT_OTHER): Payer: Self-pay

## 2019-12-25 ENCOUNTER — Emergency Department (HOSPITAL_BASED_OUTPATIENT_CLINIC_OR_DEPARTMENT_OTHER): Payer: BLUE CROSS/BLUE SHIELD

## 2019-12-25 ENCOUNTER — Other Ambulatory Visit: Payer: Self-pay

## 2019-12-25 ENCOUNTER — Emergency Department (HOSPITAL_BASED_OUTPATIENT_CLINIC_OR_DEPARTMENT_OTHER)
Admission: EM | Admit: 2019-12-25 | Discharge: 2019-12-25 | Disposition: A | Discharge status: Home / Self Care | Payer: BLUE CROSS/BLUE SHIELD | Source: Home / Self Care | Attending: Emergency Medicine | Admitting: Emergency Medicine

## 2019-12-25 DIAGNOSIS — R110 Nausea: Secondary | ICD-10-CM | POA: Diagnosis not present

## 2019-12-25 DIAGNOSIS — R109 Unspecified abdominal pain: Secondary | ICD-10-CM | POA: Diagnosis not present

## 2019-12-25 DIAGNOSIS — B9620 Unspecified Escherichia coli [E. coli] as the cause of diseases classified elsewhere: Secondary | ICD-10-CM | POA: Diagnosis not present

## 2019-12-25 DIAGNOSIS — G8929 Other chronic pain: Secondary | ICD-10-CM | POA: Diagnosis not present

## 2019-12-25 DIAGNOSIS — K8050 Calculus of bile duct without cholangitis or cholecystitis without obstruction: Secondary | ICD-10-CM | POA: Diagnosis not present

## 2019-12-25 DIAGNOSIS — Z20822 Contact with and (suspected) exposure to covid-19: Secondary | ICD-10-CM | POA: Diagnosis not present

## 2019-12-25 DIAGNOSIS — R112 Nausea with vomiting, unspecified: Secondary | ICD-10-CM | POA: Insufficient documentation

## 2019-12-25 DIAGNOSIS — E876 Hypokalemia: Secondary | ICD-10-CM | POA: Diagnosis not present

## 2019-12-25 DIAGNOSIS — R509 Fever, unspecified: Secondary | ICD-10-CM

## 2019-12-25 DIAGNOSIS — I10 Essential (primary) hypertension: Secondary | ICD-10-CM | POA: Insufficient documentation

## 2019-12-25 DIAGNOSIS — Z79899 Other long term (current) drug therapy: Secondary | ICD-10-CM | POA: Insufficient documentation

## 2019-12-25 DIAGNOSIS — Z8719 Personal history of other diseases of the digestive system: Secondary | ICD-10-CM | POA: Diagnosis not present

## 2019-12-25 DIAGNOSIS — G894 Chronic pain syndrome: Secondary | ICD-10-CM | POA: Diagnosis not present

## 2019-12-25 DIAGNOSIS — Z83438 Family history of other disorder of lipoprotein metabolism and other lipidemia: Secondary | ICD-10-CM | POA: Diagnosis not present

## 2019-12-25 DIAGNOSIS — R7881 Bacteremia: Secondary | ICD-10-CM | POA: Diagnosis not present

## 2019-12-25 DIAGNOSIS — Z888 Allergy status to other drugs, medicaments and biological substances status: Secondary | ICD-10-CM | POA: Diagnosis not present

## 2019-12-25 DIAGNOSIS — Z8249 Family history of ischemic heart disease and other diseases of the circulatory system: Secondary | ICD-10-CM | POA: Diagnosis not present

## 2019-12-25 DIAGNOSIS — R1012 Left upper quadrant pain: Secondary | ICD-10-CM | POA: Diagnosis not present

## 2019-12-25 DIAGNOSIS — F17210 Nicotine dependence, cigarettes, uncomplicated: Secondary | ICD-10-CM | POA: Diagnosis not present

## 2019-12-25 DIAGNOSIS — I4581 Long QT syndrome: Secondary | ICD-10-CM | POA: Diagnosis not present

## 2019-12-25 DIAGNOSIS — K861 Other chronic pancreatitis: Secondary | ICD-10-CM | POA: Diagnosis not present

## 2019-12-25 DIAGNOSIS — Z800 Family history of malignant neoplasm of digestive organs: Secondary | ICD-10-CM | POA: Diagnosis not present

## 2019-12-25 DIAGNOSIS — R9431 Abnormal electrocardiogram [ECG] [EKG]: Secondary | ICD-10-CM | POA: Diagnosis not present

## 2019-12-25 DIAGNOSIS — Z91041 Radiographic dye allergy status: Secondary | ICD-10-CM | POA: Diagnosis not present

## 2019-12-25 DIAGNOSIS — K838 Other specified diseases of biliary tract: Secondary | ICD-10-CM | POA: Diagnosis not present

## 2019-12-25 DIAGNOSIS — Z91012 Allergy to eggs: Secondary | ICD-10-CM | POA: Diagnosis not present

## 2019-12-25 DIAGNOSIS — I878 Other specified disorders of veins: Secondary | ICD-10-CM | POA: Diagnosis not present

## 2019-12-25 DIAGNOSIS — K3189 Other diseases of stomach and duodenum: Secondary | ICD-10-CM | POA: Diagnosis not present

## 2019-12-25 DIAGNOSIS — Z9049 Acquired absence of other specified parts of digestive tract: Secondary | ICD-10-CM | POA: Diagnosis not present

## 2019-12-25 DIAGNOSIS — I7090 Unspecified atherosclerosis: Secondary | ICD-10-CM | POA: Diagnosis not present

## 2019-12-25 DIAGNOSIS — B9689 Other specified bacterial agents as the cause of diseases classified elsewhere: Secondary | ICD-10-CM | POA: Diagnosis not present

## 2019-12-25 DIAGNOSIS — Q433 Congenital malformations of intestinal fixation: Secondary | ICD-10-CM | POA: Diagnosis not present

## 2019-12-25 DIAGNOSIS — K760 Fatty (change of) liver, not elsewhere classified: Secondary | ICD-10-CM | POA: Diagnosis present

## 2019-12-25 DIAGNOSIS — Z79890 Hormone replacement therapy (postmenopausal): Secondary | ICD-10-CM | POA: Diagnosis not present

## 2019-12-25 DIAGNOSIS — H0013 Chalazion right eye, unspecified eyelid: Secondary | ICD-10-CM | POA: Diagnosis present

## 2019-12-25 DIAGNOSIS — N179 Acute kidney failure, unspecified: Secondary | ICD-10-CM | POA: Diagnosis not present

## 2019-12-25 DIAGNOSIS — Z79891 Long term (current) use of opiate analgesic: Secondary | ICD-10-CM | POA: Diagnosis not present

## 2019-12-25 LAB — COMPREHENSIVE METABOLIC PANEL
ALT: 94 U/L — ABNORMAL HIGH (ref 0–44)
AST: 132 U/L — ABNORMAL HIGH (ref 15–41)
Albumin: 4.2 g/dL (ref 3.5–5.0)
Alkaline Phosphatase: 108 U/L (ref 38–126)
Anion gap: 10 (ref 5–15)
BUN: 16 mg/dL (ref 6–20)
CO2: 24 mmol/L (ref 22–32)
Calcium: 9.3 mg/dL (ref 8.9–10.3)
Chloride: 105 mmol/L (ref 98–111)
Creatinine, Ser: 1.28 mg/dL — ABNORMAL HIGH (ref 0.61–1.24)
GFR calc Af Amer: >60 mL/min (ref >60)
GFR calc non Af Amer: >60 mL/min (ref >60)
Glucose, Bld: 121 mg/dL — ABNORMAL HIGH (ref 70–99)
Potassium: 3.7 mmol/L (ref 3.5–5.1)
Sodium: 139 mmol/L (ref 135–145)
Total Bilirubin: 0.7 mg/dL (ref 0.3–1.2)
Total Protein: 7.5 g/dL (ref 6.5–8.1)

## 2019-12-25 LAB — URINALYSIS, MICROSCOPIC (REFLEX)

## 2019-12-25 LAB — CBC
HCT: 48.1 % (ref 39.0–52.0)
Hemoglobin: 16.9 g/dL (ref 13.0–17.0)
MCH: 30.2 pg (ref 26.0–34.0)
MCHC: 35.1 g/dL (ref 30.0–36.0)
MCV: 86 fL (ref 80.0–100.0)
Platelets: 255 10*3/uL (ref 150–400)
RBC: 5.59 MIL/uL (ref 4.22–5.81)
RDW: 12.5 % (ref 11.5–15.5)
WBC: 16.9 10*3/uL — ABNORMAL HIGH (ref 4.0–10.5)
nRBC: 0 % (ref 0.0–0.2)

## 2019-12-25 LAB — URINALYSIS, ROUTINE W REFLEX MICROSCOPIC
Bilirubin Urine: NEGATIVE
Glucose, UA: NEGATIVE mg/dL
Ketones, ur: NEGATIVE mg/dL
Leukocytes,Ua: NEGATIVE
Nitrite: NEGATIVE
Protein, ur: NEGATIVE mg/dL
Specific Gravity, Urine: >1.03 — ABNORMAL HIGH (ref 1.005–1.030)
pH: 6 (ref 5.0–8.0)

## 2019-12-25 LAB — SARS CORONAVIRUS 2 BY RT PCR (HOSPITAL ORDER, PERFORMED IN ~~LOC~~ HOSPITAL LAB): SARS Coronavirus 2: NEGATIVE

## 2019-12-25 LAB — RAPID HIV SCREEN (HIV 1/2 AB+AG)
HIV 1/2 Antibodies: NONREACTIVE
HIV-1 P24 Antigen - HIV24: NONREACTIVE

## 2019-12-25 LAB — LIPASE, BLOOD: Lipase: 36 U/L (ref 11–51)

## 2019-12-25 MED ORDER — ONDANSETRON HCL 4 MG/2ML IJ SOLN
4.0000 mg | Freq: Once | INTRAMUSCULAR | Status: AC
  Administered 2019-12-25: 4 mg via INTRAVENOUS
  Filled 2019-12-25: qty 2

## 2019-12-25 MED ORDER — PROMETHAZINE HCL 25 MG/ML IJ SOLN
25.0000 mg | Freq: Once | INTRAMUSCULAR | Status: AC
  Administered 2019-12-25: 25 mg via INTRAVENOUS
  Filled 2019-12-25: qty 1

## 2019-12-25 MED ORDER — HYDROMORPHONE HCL 1 MG/ML IJ SOLN
1.0000 mg | Freq: Once | INTRAMUSCULAR | Status: AC
  Administered 2019-12-25: 1 mg via INTRAVENOUS
  Filled 2019-12-25: qty 1

## 2019-12-25 MED ORDER — SODIUM CHLORIDE 0.9 % IV BOLUS
1000.0000 mL | Freq: Once | INTRAVENOUS | Status: AC
  Administered 2019-12-25: 1000 mL via INTRAVENOUS

## 2019-12-25 MED ORDER — ACETAMINOPHEN 325 MG PO TABS
650.0000 mg | ORAL_TABLET | Freq: Once | ORAL | Status: AC
  Administered 2019-12-25: 650 mg via ORAL
  Filled 2019-12-25: qty 2

## 2019-12-25 MED ORDER — SODIUM CHLORIDE 0.9% FLUSH
3.0000 mL | Freq: Once | INTRAVENOUS | Status: DC
  Filled 2019-12-25: qty 3

## 2019-12-25 NOTE — Discharge Instructions (Addendum)
Please read and follow all provided instructions.  Your diagnoses today include:  1. Chronic abdominal pain   2. Fever, unknown origin     Tests performed today include: Blood counts and electrolytes Blood tests to check liver and kidney function - slightly high liver function tests, please have this rechecked by your primary care doctor Blood tests to check pancreas function Blood cultures to check for bacterial infection.  Covid test (was negative) Testing for tick borne illnesses, hepatitis, HIV as part of fever evaluation.  Urine test to look for infection Vital signs. See below for your results today.   Medications prescribed:  None  Take any prescribed medications only as directed.  Home care instructions:  Follow any educational materials contained in this packet.  Follow-up instructions: Please follow-up with your primary care provider in the next 3 days for further evaluation of your symptoms.    Return instructions:  SEEK IMMEDIATE MEDICAL ATTENTION IF: The pain does not go away or becomes severe  Repeated vomiting occurs (multiple episodes)  The pain becomes localized to portions of the abdomen. The right side could possibly be appendicitis. In an adult, the left lower portion of the abdomen could be colitis or diverticulitis.  Blood is being passed in stools or vomit (bright red or black tarry stools)  You develop chest pain, difficulty breathing, dizziness or fainting, or become confused, poorly responsive, or inconsolable (young children) If you have any other emergent concerns regarding your health  Additional Information: Abdominal (belly) pain can be caused by many things. Your caregiver performed an examination and possibly ordered blood/urine tests and imaging (CT scan, x-rays, ultrasound). Many cases can be observed and treated at home after initial evaluation in the emergency department. Even though you are being discharged home, abdominal pain can be  unpredictable. Therefore, you need a repeated exam if your pain does not resolve, returns, or worsens. Most patients with abdominal pain don't have to be admitted to the hospital or have surgery, but serious problems like appendicitis and gallbladder attacks can start out as nonspecific pain. Many abdominal conditions cannot be diagnosed in one visit, so follow-up evaluations are very important.  Your vital signs today were: BP (!) 127/92 (BP Location: Right Arm)   Pulse 98   Temp (!) 101.9 F (38.8 C) (Oral)   Resp 20   Ht 6\' 1"  (1.854 m)   Wt (!) 93.4 kg   SpO2 98%   BMI 27.18 kg/m  If your blood pressure (bp) was elevated above 135/85 this visit, please have this repeated by your doctor within one month. --------------

## 2019-12-25 NOTE — ED Notes (Signed)
PA Josh informed of elevated temp.

## 2019-12-25 NOTE — ED Provider Notes (Signed)
[facility] EMERGENCY DEPARTMENT Provider Note   CSN: [id number] Arrival date & time: 12/25/19  1140     History Chief Complaint  Patient presents with  . Abdominal Pain    [name] is a 44 y.o. male.  Patient with history of chronic abdominal pain, renal stones, chronic pancreatitis?, use of chronic opioids presents with abdominal pain and vomiting since early this morning. The pt reports that he started vomiting at 2 AM this morning and has vomited around 10-15 times total since it began. He says the vomit is clear to yellow in color . He says that his abdominal pain is in the LUQ and describes it as "sharp, and like an arrow shot through straight to my back". He denies radiation of the pain elsewhere beyond his back. He says this pain episode with vomiting is similar to pain he has experienced in the past and has been treated with IVF and pain meds. He says that he is concerned because he is unable to keep down his pain medications due to vomiting. He denies diarrhea, constipation, chest pain, shortness of breath, palpitations.  Denies heavy NSAID use or recent EtOH use.          Past Medical History:  Diagnosis Date  . Chronic abdominal pain   . Diverticulitis   . Drug-seeking behavior   . Hypertension   . Kidney stones   . Pancreatitis, chronic ([place]) 05/28/2005    Patient Active Problem List   Diagnosis Date Noted  . Chronic pain disorder 08/29/2018  . Back pain, chronic 08/29/2018  . Nausea and vomiting in adult 02/25/2018  . Hypertension, essential, benign 01/18/2018  . Chronic biliary pancreatitis ([place]) 01/18/2018  . Chronic abdominal pain 01/14/2018    Past Surgical History:  Procedure Laterality Date  . APPENDECTOMY    . CHOLECYSTECTOMY    . ERCP W/ METAL STENT PLACEMENT    . KIDNEY STONE SURGERY         Family History  Problem Relation Age of Onset  . Cancer Mother   . Early death Mother   . Hypertension Mother   . Heart failure  Father   . Hypertension Father   . Heart disease Father   . Hyperlipidemia Father   . Pancreatic cancer Paternal Grandmother        possibly  . Pancreatic cancer Paternal Aunt   . Colon cancer Neg Hx     Social History   Tobacco Use  . Smoking status: Current Every Day Smoker    Packs/day: 0.50    Types: Cigarettes  . Smokeless tobacco: Never Used  Vaping Use  . Vaping Use: Never used  Substance Use Topics  . Alcohol use: Yes    Comment: occ  . Drug use: No    Home Medications Prior to Admission medications   Medication Sig Start Date End Date Taking? Authorizing Provider  amLODipine-benazepril (LOTREL) 5-20 MG capsule TAKE 1 CAPSULE BY MOUTH EVERY DAY 11/19/19   Martinique, Betty G, MD  fluticasone [facility]) 50 MCG/ACT nasal spray Place 1 spray into both nostrils 2 (two) times daily. 04/03/19   Martin[place], Betty G, MD  metoprolol succinate (TOPROL-XL) 100 MG 24 hr tablet Take 1 tablet (100 mg total) by mouth daily. Take with or immediately following a meal. 12/27/18   Martinique, Betty G, MD  naloxone [facility]) nasal spray 4 mg/0.1 mL 1 spray in each nostril x 1 if opioid overdose. 03/23/19   Martinique, Betty G, MD  omeprazole [facility])  20 MG capsule Take 1 capsule (20 mg total) by mouth daily. 03/23/19   Horton, Barbette Hair, MD  ondansetron (ZOFRAN ODT) 8 MG disintegrating tablet Take 1 tablet (8 mg total) by mouth every 8 (eight) hours as needed for nausea or vomiting. 06/24/19   Martinique, Betty G, MD  oxyCODONE ER [facility]) 9 MG C12A Take 1 tablet by mouth 2 (two) times daily. 12/07/19   Martinique, Betty G, MD  Oxycodone HCl 10 MG TABS 1 tablet twice daily as needed, can take an extra tablet if needed.  No more than 70 tablets/month. 11/24/19   Martinique, Betty G, MD  promethazine (PHENERGAN) 25 MG suppository Place 1 suppository (25 mg total) rectally every 8 (eight) hours as needed for nausea or vomiting. 02/20/19   Martin[place], Betty G, MD  promethazine (PHENERGAN) 25 MG tablet TAKE 1 TABLET (25 MG TOTAL)  BY MOUTH EVERY 12 (TWELVE) HOURS AS NEEDED. 12/04/19   Martinique, Betty G, MD  sucralfate (CARAFATE) 1 g tablet Take 1 tablet (1 g total) by mouth 4 (four) times daily -  with meals and at bedtime. 03/23/19   Horton, Barbette Hair, MD    Allergies    Cortisone, Eggs or egg-derived products, Ketorolac, Ketorolac tromethamine, Haldol [haloperidol lactate], Haloperidol, Hydrocortisone, Iodinated diagnostic agents, Ketorolac tromethamine, and Reglan [metoclopramide]  Review of Systems   Review of Systems  Constitutional: Negative for fever.  HENT: Negative for rhinorrhea and sore throat.   Eyes: Negative for redness.  Respiratory: Negative for cough.   Cardiovascular: Negative for chest pain.  Gastrointestinal: Positive for abdominal pain, nausea and vomiting. Negative for diarrhea.  Genitourinary: Negative for dysuria and hematuria.  Musculoskeletal: Negative for myalgias.  Skin: Negative for rash.  Neurological: Negative for headaches.    Physical Exam Updated Vital Signs BP (!) 132/97 (BP Location: Left Arm)   Pulse 78   Temp 98.5 F (36.9 C) (Oral)   Resp 16   Ht 6\' 1"  (1.854 m)   Wt (!) 93.4 kg   SpO2 98%   BMI 27.18 kg/m   Physical Exam Vitals and nursing note reviewed.  Constitutional:      Appearance: He is well-developed.  HENT:     Head: Normocephalic and atraumatic.  Eyes:     General:        Right eye: No discharge.        Left eye: No discharge.     Conjunctiva/sclera: Conjunctivae normal.  Cardiovascular:     Rate and Rhythm: Normal rate and regular rhythm.     Heart sounds: Normal heart sounds.  Pulmonary:     Effort: Pulmonary effort is normal.     Breath sounds: Normal breath sounds.  Abdominal:     Palpations: Abdomen is soft.     Tenderness: There is abdominal tenderness (mild) in the left upper quadrant. There is no guarding or rebound. Negative signs include Murphy's sign.  Musculoskeletal:     Cervical back: Normal range of motion and neck supple.    Skin:    General: Skin is warm and dry.  Neurological:     Mental Status: He is alert.     ED Results / Procedures / Treatments   Labs (all labs ordered are listed, but only abnormal results are displayed) Labs Reviewed  COMPREHENSIVE METABOLIC PANEL - Abnormal; Notable for the following components:      Result Value   Glucose, Bld 121 (*)    Creatinine, Ser 1.28 (*)    AST 132 (*)  ALT 94 (*)    All other components within normal limits  CBC - Abnormal; Notable for the following components:   WBC 16.9 (*)    All other components within normal limits  URINALYSIS, ROUTINE W REFLEX MICROSCOPIC - Abnormal; Notable for the following components:   Specific Gravity, Urine >1.030 (*)    Hgb urine dipstick TRACE (*)    All other components within normal limits  URINALYSIS, MICROSCOPIC (REFLEX) - Abnormal; Notable for the following components:   Bacteria, UA FEW (*)    All other components within normal limits  SARS CORONAVIRUS 2 BY RT PCR (HOSPITAL ORDER, [facility])  LIPASE, BLOOD    EKG None  Radiology No results found.  Procedures Procedures (including critical care time)  Medications Ordered in ED Medications  sodium chloride flush (NS) 0.9 % injection 3 mL (3 mLs Intravenous Not Given 12/25/19 1838)  acetaminophen (TYLENOL) tablet 650 mg (has no administration in time range)  HYDROmorphone (DILAUDID) injection 1 mg (1 mg Intravenous Given 12/25/19 1653)  ondansetron (ZOFRAN) injection 4 mg (4 mg Intravenous Given 12/25/19 1650)  sodium chloride 0.9 % bolus 1,000 mL (0 mLs Intravenous Stopped 12/25/19 1803)  HYDROmorphone (DILAUDID) injection 1 mg (1 mg Intravenous Given 12/25/19 1841)  promethazine (PHENERGAN) injection 25 mg (25 mg Intravenous Given 12/25/19 1841)    ED Course  I have reviewed the triage vital signs and the nursing notes.  Pertinent labs & imaging results that were available during my care of the patient were reviewed by  me and considered in my medical decision making (see chart for details).  Patient seen and examined.  Extensive history of evaluation for chronic abdominal pain.  Symptoms today are typical for patient's presentation.  He does have elevated creatinine and transaminases compared to recent visits.  Will give 1 mg of Dilaudid IV, 4 mg of Zofran, IV fluids.  Will attempt to transition to oral fluids.  At this point, no significant focal tenderness, rebound or guarding on exam.  Do not feel that he requires another CT at this time unless symptoms are intractable.  Vital signs reviewed and are as follows: BP (!) 132/97 (BP Location: Left Arm)   Pulse 78   Temp 98.5 F (36.9 C) (Oral)   Resp 16   Ht 6\' 1"  (1.854 m)   Wt (!) 93.4 kg   SpO2 98%   BMI 27.18 kg/m   6:43 PM patient rechecked. He has been able to urinate. Awaiting UA. And requesting additional medication for pain and nausea. Additional dose of Dilaudid and Phenergan ordered. Will p.o. challenge. Plan for discharge to home.  7:32 PM patient developed a fever with shaking chills.  Additional work-up indicated.  Will check chest x-ray, Covid test, UA.  Tylenol ordered.  Abdomen reexamined.  Remains soft and nontender without focal tenderness.  Reconfirmed with patient that his abdominal pain symptoms are similar to what he has experienced for years without any differences.  Signout to [employer] at shift change.    MDM Rules/Calculators/A&P                          Patient with chronic abdominal pain presents with similar symptoms to previous.  However while in emergency department he developed a fever requiring further work-up.  Pending completion of work-up.  Patient appears well, nontoxic.   Final Clinical Impression(s) / ED Diagnoses Final diagnoses:  Chronic abdominal pain    Rx /  DC Orders ED Discharge Orders    None       Carlisle Cater, Hershal Coria 12/25/19 1933    Sherwood Gambler, MD 12/26/19 [phone]

## 2019-12-25 NOTE — ED Notes (Signed)
Crystal RT inserted IV, clicked off order

## 2019-12-25 NOTE — ED Triage Notes (Signed)
C/o LUQ pain, n/v since 2am-reports feel same as "chronic pancreatitis"-NAD-steady gait

## 2019-12-25 NOTE — ED Provider Notes (Signed)
I assumed care of patient at signout from [facility] PA-C, please see his note for full H&P. Physical Exam  BP (!) 127/92 (BP Location: Right Arm)   Pulse 98   Temp (!) 101.9 F (38.8 C) (Oral)   Resp 20   Ht 6\' 1"  (1.854 m)   Wt (!) 93.4 kg   SpO2 98%   BMI 27.18 kg/m  Patient is awake and alert, sitting up in bed.  Answers questions appropriately.  Is not an extremis.  Respirations are even and unlabored.  Abdomen is tender epigastric and left upper quadrant.    ED Course/Procedures     Procedures  MDM  Plan is to follow-up on chest x-ray, urine, response to antipyretics.  Patient's temperature improved after antipyretics however he still remained febrile at 1-1.9.  UA has few bacteria however he is not having any significant urinary symptoms.  Chest x-ray without cause for his fever. Given his abdominal pain with fever of unknown origin CT scan was obtained without cause for his fever found.  CT scan was obtained without contrast due to allergy.  Patient reevaluated.  He denies any wounds.  No pain in his head, neck, throat.  He does have some slight allergies however denies any double sickening or URI symptoms, states that it is consistent with his usual.  Denies any history such as tick bites or other possible causes of fever.  He is vaccinated against Covid and his Covid test is negative.  Given his slight transaminitis hepatitis panel is sent.  Additionally send HIV testing, blood cultures, testing for Lyme and RMSF.  I discussed disposition options with patient.  He wishes for discharge home at this time.  We discussed safe dosing of antipyretics.  Return precautions were discussed with patient who states their understanding.  At the time of discharge patient denied any unaddressed complaints or concerns.  Patient is agreeable for discharge home.  Note: Portions of this report may have been transcribed using voice recognition software. Every effort was made to ensure  accuracy; however, inadvertent computerized transcription errors may be present        Lorin Glass, PA-C 12/25/19 [place], MD 12/26/19 [phone]

## 2019-12-25 NOTE — ED Notes (Signed)
Provider aware of elevated temp

## 2019-12-26 ENCOUNTER — Other Ambulatory Visit: Payer: Self-pay

## 2019-12-26 ENCOUNTER — Telehealth (HOSPITAL_BASED_OUTPATIENT_CLINIC_OR_DEPARTMENT_OTHER): Payer: Self-pay | Admitting: *Deleted

## 2019-12-26 DIAGNOSIS — Z888 Allergy status to other drugs, medicaments and biological substances status: Secondary | ICD-10-CM

## 2019-12-26 DIAGNOSIS — N179 Acute kidney failure, unspecified: Secondary | ICD-10-CM | POA: Diagnosis present

## 2019-12-26 DIAGNOSIS — I10 Essential (primary) hypertension: Secondary | ICD-10-CM | POA: Diagnosis present

## 2019-12-26 DIAGNOSIS — Z8249 Family history of ischemic heart disease and other diseases of the circulatory system: Secondary | ICD-10-CM

## 2019-12-26 DIAGNOSIS — Z800 Family history of malignant neoplasm of digestive organs: Secondary | ICD-10-CM

## 2019-12-26 DIAGNOSIS — K8050 Calculus of bile duct without cholangitis or cholecystitis without obstruction: Secondary | ICD-10-CM | POA: Diagnosis present

## 2019-12-26 DIAGNOSIS — G894 Chronic pain syndrome: Secondary | ICD-10-CM | POA: Diagnosis present

## 2019-12-26 DIAGNOSIS — Z79899 Other long term (current) drug therapy: Secondary | ICD-10-CM

## 2019-12-26 DIAGNOSIS — R7881 Bacteremia: Principal | ICD-10-CM | POA: Diagnosis present

## 2019-12-26 DIAGNOSIS — K760 Fatty (change of) liver, not elsewhere classified: Secondary | ICD-10-CM | POA: Diagnosis present

## 2019-12-26 DIAGNOSIS — Z83438 Family history of other disorder of lipoprotein metabolism and other lipidemia: Secondary | ICD-10-CM

## 2019-12-26 DIAGNOSIS — Z79891 Long term (current) use of opiate analgesic: Secondary | ICD-10-CM

## 2019-12-26 DIAGNOSIS — Z20822 Contact with and (suspected) exposure to covid-19: Secondary | ICD-10-CM | POA: Diagnosis present

## 2019-12-26 DIAGNOSIS — B9620 Unspecified Escherichia coli [E. coli] as the cause of diseases classified elsewhere: Secondary | ICD-10-CM | POA: Diagnosis present

## 2019-12-26 DIAGNOSIS — F17210 Nicotine dependence, cigarettes, uncomplicated: Secondary | ICD-10-CM | POA: Diagnosis present

## 2019-12-26 DIAGNOSIS — Z8719 Personal history of other diseases of the digestive system: Secondary | ICD-10-CM

## 2019-12-26 DIAGNOSIS — K861 Other chronic pancreatitis: Secondary | ICD-10-CM | POA: Diagnosis present

## 2019-12-26 DIAGNOSIS — Z9049 Acquired absence of other specified parts of digestive tract: Secondary | ICD-10-CM

## 2019-12-26 DIAGNOSIS — Z91041 Radiographic dye allergy status: Secondary | ICD-10-CM

## 2019-12-26 DIAGNOSIS — Z91012 Allergy to eggs: Secondary | ICD-10-CM

## 2019-12-26 DIAGNOSIS — R9431 Abnormal electrocardiogram [ECG] [EKG]: Secondary | ICD-10-CM | POA: Diagnosis present

## 2019-12-26 DIAGNOSIS — Z79890 Hormone replacement therapy (postmenopausal): Secondary | ICD-10-CM

## 2019-12-26 DIAGNOSIS — E876 Hypokalemia: Secondary | ICD-10-CM | POA: Diagnosis present

## 2019-12-26 DIAGNOSIS — H0013 Chalazion right eye, unspecified eyelid: Secondary | ICD-10-CM | POA: Diagnosis present

## 2019-12-26 LAB — BLOOD CULTURE ID PANEL (REFLEXED)

## 2019-12-26 LAB — HEPATITIS PANEL, ACUTE
HCV Ab: NONREACTIVE
Hep A IgM: NONREACTIVE
Hep B C IgM: NONREACTIVE
Hepatitis B Surface Ag: NONREACTIVE

## 2019-12-26 NOTE — ED Provider Notes (Signed)
Blood cultures returned.  Was made aware of results by RN.  RN had difficulty in contacting patient.  I called patient and was able to reach him.  We discussed his positive blood cultures.  Suspect this may be from translocation of GI bacteria given that it is E. Coli. What is only one culture that is positive I recommended that he go to the emergency room.  Specifically I amended that he go to [facility] or [facility] as he would most likely require admission for IV antibiotics.  He states his understanding of the instructions. Agricultural consultant, who took call of positive cultures is aware.  This was discussed with Dr. Rex Kras also.  Blood Culture    Component Value Date/Time   SDES  12/25/2019 2300    BLOOD RIGHT ANTECUBITAL Performed at [facility], [address] [place], [place] [address]    [facility]  12/25/2019 2300    BOTTLES DRAWN AEROBIC AND ANAEROBIC Blood Culture adequate volume Performed at [facility], [address] [place], [place] [address]    Ruthine Dose NEGATIVE RODS 12/25/2019 2300   REPTSTATUS PENDING 12/25/2019 2300    Labs Reviewed  BLOOD CULTURE ID PANEL (REFLEXED) - Abnormal; Notable for the following components:      Result Value   Enterobacteriaceae species DETECTED (*)    Escherichia coli DETECTED (*)    All other components within normal limits  COMPREHENSIVE METABOLIC PANEL - Abnormal; Notable for the following components:   Glucose, Bld 121 (*)    Creatinine, Ser 1.28 (*)    AST 132 (*)    ALT 94 (*)    All other components within normal limits  CBC - Abnormal; Notable for the following components:   WBC 16.9 (*)    All other components within normal limits  URINALYSIS, ROUTINE W REFLEX MICROSCOPIC - Abnormal; Notable for the following components:   Specific Gravity, Urine >1.030 (*)    Hgb urine dipstick TRACE (*)    All other components within normal limits  URINALYSIS, MICROSCOPIC (REFLEX) - Abnormal; Notable for the following  components:   Bacteria, UA FEW (*)    All other components within normal limits  SARS CORONAVIRUS 2 BY RT PCR (HOSPITAL ORDER, [place] [facility])  CULTURE, BLOOD (ROUTINE X 2)  CULTURE, BLOOD (ROUTINE X 2)  URINE CULTURE  LIPASE, BLOOD  RAPID HIV SCREEN (HIV 1/2 AB+AG)  HEPATITIS PANEL, ACUTE  ROCKY MTN SPOTTED FVR ABS PNL(IGG+IGM)  B. BURGDORFI ANTIBODIES      Lorin Glass, PA-C 12/26/19 2309    Rex Kras, Wenda Overland, MD 12/26/19 [phone]

## 2019-12-27 ENCOUNTER — Inpatient Hospital Stay (HOSPITAL_COMMUNITY)
Admission: EM | Admit: 2019-12-27 | Discharge: 2019-12-30 | DRG: 872 | Disposition: A | Discharge status: Home / Self Care | Payer: BLUE CROSS/BLUE SHIELD | Attending: Internal Medicine | Admitting: Internal Medicine

## 2019-12-27 ENCOUNTER — Encounter (HOSPITAL_COMMUNITY): Payer: Self-pay | Admitting: Emergency Medicine

## 2019-12-27 ENCOUNTER — Other Ambulatory Visit: Payer: Self-pay

## 2019-12-27 DIAGNOSIS — B9689 Other specified bacterial agents as the cause of diseases classified elsewhere: Secondary | ICD-10-CM | POA: Diagnosis present

## 2019-12-27 DIAGNOSIS — H0013 Chalazion right eye, unspecified eyelid: Secondary | ICD-10-CM | POA: Diagnosis present

## 2019-12-27 DIAGNOSIS — Z91041 Radiographic dye allergy status: Secondary | ICD-10-CM | POA: Diagnosis not present

## 2019-12-27 DIAGNOSIS — R7881 Bacteremia: Secondary | ICD-10-CM | POA: Diagnosis present

## 2019-12-27 DIAGNOSIS — Z20822 Contact with and (suspected) exposure to covid-19: Secondary | ICD-10-CM | POA: Diagnosis present

## 2019-12-27 DIAGNOSIS — R109 Unspecified abdominal pain: Secondary | ICD-10-CM | POA: Diagnosis not present

## 2019-12-27 DIAGNOSIS — Z888 Allergy status to other drugs, medicaments and biological substances status: Secondary | ICD-10-CM | POA: Diagnosis not present

## 2019-12-27 DIAGNOSIS — K760 Fatty (change of) liver, not elsewhere classified: Secondary | ICD-10-CM | POA: Diagnosis present

## 2019-12-27 DIAGNOSIS — Z79891 Long term (current) use of opiate analgesic: Secondary | ICD-10-CM | POA: Diagnosis not present

## 2019-12-27 DIAGNOSIS — G894 Chronic pain syndrome: Secondary | ICD-10-CM | POA: Diagnosis present

## 2019-12-27 DIAGNOSIS — Z83438 Family history of other disorder of lipoprotein metabolism and other lipidemia: Secondary | ICD-10-CM | POA: Diagnosis not present

## 2019-12-27 DIAGNOSIS — K8050 Calculus of bile duct without cholangitis or cholecystitis without obstruction: Secondary | ICD-10-CM | POA: Diagnosis present

## 2019-12-27 DIAGNOSIS — Z800 Family history of malignant neoplasm of digestive organs: Secondary | ICD-10-CM | POA: Diagnosis not present

## 2019-12-27 DIAGNOSIS — Z79899 Other long term (current) drug therapy: Secondary | ICD-10-CM | POA: Diagnosis not present

## 2019-12-27 DIAGNOSIS — Z79890 Hormone replacement therapy (postmenopausal): Secondary | ICD-10-CM | POA: Diagnosis not present

## 2019-12-27 DIAGNOSIS — R110 Nausea: Secondary | ICD-10-CM | POA: Diagnosis not present

## 2019-12-27 DIAGNOSIS — K861 Other chronic pancreatitis: Secondary | ICD-10-CM | POA: Diagnosis present

## 2019-12-27 DIAGNOSIS — Z9049 Acquired absence of other specified parts of digestive tract: Secondary | ICD-10-CM | POA: Diagnosis not present

## 2019-12-27 DIAGNOSIS — R9431 Abnormal electrocardiogram [ECG] [EKG]: Secondary | ICD-10-CM | POA: Diagnosis present

## 2019-12-27 DIAGNOSIS — F17210 Nicotine dependence, cigarettes, uncomplicated: Secondary | ICD-10-CM | POA: Diagnosis present

## 2019-12-27 DIAGNOSIS — R933 Abnormal findings on diagnostic imaging of other parts of digestive tract: Secondary | ICD-10-CM

## 2019-12-27 DIAGNOSIS — R112 Nausea with vomiting, unspecified: Secondary | ICD-10-CM

## 2019-12-27 DIAGNOSIS — G8929 Other chronic pain: Secondary | ICD-10-CM | POA: Diagnosis not present

## 2019-12-27 DIAGNOSIS — B9620 Unspecified Escherichia coli [E. coli] as the cause of diseases classified elsewhere: Secondary | ICD-10-CM | POA: Diagnosis present

## 2019-12-27 DIAGNOSIS — Z8249 Family history of ischemic heart disease and other diseases of the circulatory system: Secondary | ICD-10-CM | POA: Diagnosis not present

## 2019-12-27 DIAGNOSIS — I10 Essential (primary) hypertension: Secondary | ICD-10-CM | POA: Diagnosis present

## 2019-12-27 DIAGNOSIS — N179 Acute kidney failure, unspecified: Secondary | ICD-10-CM | POA: Diagnosis present

## 2019-12-27 DIAGNOSIS — Z8719 Personal history of other diseases of the digestive system: Secondary | ICD-10-CM | POA: Diagnosis not present

## 2019-12-27 DIAGNOSIS — E876 Hypokalemia: Secondary | ICD-10-CM | POA: Diagnosis present

## 2019-12-27 DIAGNOSIS — Z91012 Allergy to eggs: Secondary | ICD-10-CM | POA: Diagnosis not present

## 2019-12-27 DIAGNOSIS — K859 Acute pancreatitis without necrosis or infection, unspecified: Secondary | ICD-10-CM

## 2019-12-27 LAB — COMPREHENSIVE METABOLIC PANEL
ALT: 92 U/L — ABNORMAL HIGH (ref 0–44)
AST: 59 U/L — ABNORMAL HIGH (ref 15–41)
Albumin: 4.3 g/dL (ref 3.5–5.0)
Alkaline Phosphatase: 83 U/L (ref 38–126)
Anion gap: 11 (ref 5–15)
BUN: 14 mg/dL (ref 6–20)
CO2: 23 mmol/L (ref 22–32)
Calcium: 9.2 mg/dL (ref 8.9–10.3)
Chloride: 105 mmol/L (ref 98–111)
Creatinine, Ser: 1.04 mg/dL (ref 0.61–1.24)
GFR calc Af Amer: >60 mL/min (ref >60)
GFR calc non Af Amer: >60 mL/min (ref >60)
Glucose, Bld: 99 mg/dL (ref 70–99)
Potassium: 3.5 mmol/L (ref 3.5–5.1)
Sodium: 139 mmol/L (ref 135–145)
Total Bilirubin: 0.5 mg/dL (ref 0.3–1.2)
Total Protein: 7.5 g/dL (ref 6.5–8.1)

## 2019-12-27 LAB — URINALYSIS, ROUTINE W REFLEX MICROSCOPIC
Bilirubin Urine: NEGATIVE
Glucose, UA: NEGATIVE mg/dL
Hgb urine dipstick: NEGATIVE
Ketones, ur: NEGATIVE mg/dL
Leukocytes,Ua: NEGATIVE
Nitrite: NEGATIVE
Protein, ur: NEGATIVE mg/dL
Specific Gravity, Urine: 1.025 (ref 1.005–1.030)
pH: 6 (ref 5.0–8.0)

## 2019-12-27 LAB — CBC
HCT: 44 % (ref 39.0–52.0)
Hemoglobin: 15.5 g/dL (ref 13.0–17.0)
MCH: 30.5 pg (ref 26.0–34.0)
MCHC: 35.2 g/dL (ref 30.0–36.0)
MCV: 86.4 fL (ref 80.0–100.0)
Platelets: 167 10*3/uL (ref 150–400)
RBC: 5.09 MIL/uL (ref 4.22–5.81)
RDW: 12.9 % (ref 11.5–15.5)
WBC: 11.5 10*3/uL — ABNORMAL HIGH (ref 4.0–10.5)
nRBC: 0 % (ref 0.0–0.2)

## 2019-12-27 LAB — URINE CULTURE: Culture: NO GROWTH

## 2019-12-27 LAB — MAGNESIUM: Magnesium: 2.3 mg/dL (ref 1.7–2.4)

## 2019-12-27 LAB — SARS CORONAVIRUS 2 BY RT PCR (HOSPITAL ORDER, PERFORMED IN ~~LOC~~ HOSPITAL LAB): SARS Coronavirus 2: NEGATIVE

## 2019-12-27 MED ORDER — OXYCODONE ER 9 MG PO C12A: 1.0000 | EXTENDED_RELEASE_CAPSULE | Freq: Two times a day (BID) | ORAL | Status: DC

## 2019-12-27 MED ORDER — NICOTINE 14 MG/24HR TD PT24
14.0000 mg | MEDICATED_PATCH | Freq: Every day | TRANSDERMAL | Status: DC
  Filled 2019-12-27 (×2): qty 1

## 2019-12-27 MED ORDER — ACETAMINOPHEN 325 MG PO TABS
650.0000 mg | ORAL_TABLET | Freq: Four times a day (QID) | ORAL | Status: DC | PRN
  Administered 2019-12-27 – 2019-12-30 (×3): 650 mg via ORAL
  Filled 2019-12-27 (×3): qty 2

## 2019-12-27 MED ORDER — METOPROLOL SUCCINATE ER 100 MG PO TB24
100.0000 mg | ORAL_TABLET | Freq: Every day | ORAL | Status: DC
  Administered 2019-12-27 – 2019-12-30 (×4): 100 mg via ORAL
  Filled 2019-12-27: qty 2
  Filled 2019-12-27 (×2): qty 1
  Filled 2019-12-27: qty 2

## 2019-12-27 MED ORDER — SODIUM CHLORIDE 0.9 % IV BOLUS
500.0000 mL | Freq: Once | INTRAVENOUS | Status: AC
  Administered 2019-12-27: 500 mL via INTRAVENOUS

## 2019-12-27 MED ORDER — SODIUM CHLORIDE 0.9 % IV SOLN: 2.0000 g | Freq: Once | INTRAVENOUS | Status: DC

## 2019-12-27 MED ORDER — ONDANSETRON HCL 4 MG/2ML IJ SOLN
4.0000 mg | Freq: Once | INTRAMUSCULAR | Status: AC
  Administered 2019-12-27: 4 mg via INTRAVENOUS
  Filled 2019-12-27: qty 2

## 2019-12-27 MED ORDER — ACETAMINOPHEN 650 MG RE SUPP: 650.0000 mg | Freq: Four times a day (QID) | RECTAL | Status: DC | PRN

## 2019-12-27 MED ORDER — SODIUM CHLORIDE 0.9 % IV SOLN
2.0000 g | INTRAVENOUS | Status: DC
  Administered 2019-12-27 – 2019-12-29 (×3): 2 g via INTRAVENOUS
  Filled 2019-12-27: qty 20
  Filled 2019-12-27: qty 2
  Filled 2019-12-27: qty 20
  Filled 2019-12-27: qty 2

## 2019-12-27 MED ORDER — ENOXAPARIN SODIUM 40 MG/0.4ML ~~LOC~~ SOLN
40.0000 mg | SUBCUTANEOUS | Status: DC
  Administered 2019-12-27: 40 mg via SUBCUTANEOUS
  Filled 2019-12-27 (×4): qty 0.4

## 2019-12-27 MED ORDER — SODIUM CHLORIDE 0.9 % IV SOLN
2.0000 g | Freq: Three times a day (TID) | INTRAVENOUS | Status: DC
  Administered 2019-12-27 (×2): 2 g via INTRAVENOUS
  Filled 2019-12-27 (×2): qty 2

## 2019-12-27 MED ORDER — HYDROMORPHONE HCL 1 MG/ML IJ SOLN
1.0000 mg | INTRAMUSCULAR | Status: DC | PRN
  Administered 2019-12-27 – 2019-12-28 (×4): 1 mg via INTRAVENOUS
  Filled 2019-12-27 (×5): qty 1

## 2019-12-27 MED ORDER — FENTANYL CITRATE (PF) 100 MCG/2ML IJ SOLN
50.0000 ug | Freq: Once | INTRAMUSCULAR | Status: AC
  Administered 2019-12-27: 50 ug via INTRAVENOUS
  Filled 2019-12-27: qty 2

## 2019-12-27 MED ORDER — PROCHLORPERAZINE EDISYLATE 10 MG/2ML IJ SOLN: 5.0000 mg | Freq: Four times a day (QID) | INTRAMUSCULAR | Status: DC | PRN

## 2019-12-27 MED ORDER — OXYCODONE HCL 10 MG PO TABS: 10.0000 mg | ORAL_TABLET | Freq: Two times a day (BID) | ORAL | Status: DC

## 2019-12-27 MED ORDER — PROCHLORPERAZINE EDISYLATE 10 MG/2ML IJ SOLN
5.0000 mg | INTRAMUSCULAR | Status: DC | PRN
  Administered 2019-12-28: 5 mg via INTRAVENOUS
  Filled 2019-12-27: qty 2

## 2019-12-27 MED ORDER — SODIUM CHLORIDE 0.9 % IV BOLUS
1000.0000 mL | Freq: Once | INTRAVENOUS | Status: AC
  Administered 2019-12-27: 1000 mL via INTRAVENOUS

## 2019-12-27 MED ORDER — OXYCODONE HCL ER 10 MG PO T12A
10.0000 mg | EXTENDED_RELEASE_TABLET | Freq: Two times a day (BID) | ORAL | Status: DC
  Administered 2019-12-27 – 2019-12-30 (×7): 10 mg via ORAL
  Filled 2019-12-27 (×7): qty 1

## 2019-12-27 NOTE — ED Triage Notes (Signed)
Patient comes to ED due to positive cultures on 12/25/2019. Patient has Escherichia Coli and Enterobacteriaceae in blood stream.

## 2019-12-27 NOTE — Consult Note (Signed)
Consultation  Referring Provider:   Shela Leff, MD Primary Care Physician:  Martinique, Betty G, MD Primary Gastroenterologist:        Scarlette Shorts, MD Reason for Consultation:     Elevated liver enzymes, E. coli bacteremia         HPI:   [name] is a 44 y.o. male with a reported history of chronic pancreatitis and subsequent chronic abdominal pain on chronic opioids for the last 15+ years, nephrolithiasis, HTN, ccy, appendectomy.  He was initially seen in the ER on 7/30 with abdominal pain and nausea/vomiting.  He reports this was similar to previous "pancreatitis attacks" in the past.  In the ER, was noted to have fever/chills.  Work-up as below:  -WBC 16.9 otherwise normal CBC -BUN/creatinine 16/1.3, otherwise normal BMP -AST/ALT 132/94, ALP 108, T bili 0.7, albumin 4.2 -Lipase normal -CT: No acute intra-abdominal process.  History of ccy with pneumobilia (stable from previous), probable hepatic steatosis, normal-appearing pancreas -CXR normal -UA negative/normal -Covid negative -Viral hep panel and HIV negative -[place] spotted fever and Lyme panel collected and pending -Blood culture: Positive for E. coli/Enterobacteriaceae  Patient was called to return to the ER due to positive blood culture.  He reports that he has had ongoing fever over the last 2 days.  No temp checked at home.  Ongoing nausea, but no longer with emesis.  Pain no different from his "chronic pain".  Did have a single episode of loose stools yesterday, but not frank diarrhea and not ongoing.  In ER today, hemodynamically stable and afebrile.  Repeat work-up as below:  -WBC 11.5, otherwise normal CBC -BUN/creatinine 14/1.0 -AST/ALT 59/92, ALP 83, T bili 0.5 -UA normal -Started on cefepime and IV fluids  Was seen by Dr. Henrene Pastor in 05/2022 MEG/left-sided abdominal pain.  Patient with reported diagnosis of chronic pancreatitis, but has had several CT scans and MRIs (20+) over the years with an  otherwise normal-appearing pancreas.  Has had lipase checked many times over the years.  Last abnormal was 12/2014 (144) and highest was 226 in 02/2013 (barely 3 times ULN).  He is prescribed chronic opioids.  EGD/colonoscopy completed at that time for work-up of abdominal pain, which was unrevealing as below.  Endoscopic History: -EGD (05/2018, Dr. Henrene Pastor): Normal -Colonoscopy (05/2018, Dr. Henrene Pastor): 1 small tubular adenoma, one benign inflammatory polyp, otherwise normal.  Repeat in 5 years -EUS (06/2005, Dr. Ardis Hughs): CBD 6.9 mm, CHD 9.5 mm, no filling defects.  Normal HOP, somewhat lobular body/tail -ERCP (unsure when, [facility]): Sphincterotomy performed and sounds like pancreatic duct placed and then later removed  Past Medical History:  Diagnosis Date  . Chronic abdominal pain   . Diverticulitis   . Drug-seeking behavior   . Hypertension   . Kidney stones   . Pancreatitis, chronic ([place]) 05/28/2005    Past Surgical History:  Procedure Laterality Date  . APPENDECTOMY    . CHOLECYSTECTOMY    . ERCP W/ METAL STENT PLACEMENT    . KIDNEY STONE SURGERY      Family History  Problem Relation Age of Onset  . Cancer Mother   . Early death Mother   . Hypertension Mother   . Heart failure Father   . Hypertension Father   . Heart disease Father   . Hyperlipidemia Father   . Pancreatic cancer Paternal Grandmother        possibly  . Pancreatic cancer Paternal Aunt   . Colon cancer Neg Hx  Social History   Tobacco Use  . Smoking status: Current Every Day Smoker    Packs/day: 0.50    Types: Cigarettes  . Smokeless tobacco: Never Used  Vaping Use  . Vaping Use: Never used  Substance Use Topics  . Alcohol use: Yes    Comment: occ  . Drug use: No    Prior to Admission medications   Medication Sig Start Date End Date Taking? Authorizing Provider  amLODipine-benazepril (LOTREL) 5-20 MG capsule TAKE 1 CAPSULE BY MOUTH EVERY DAY 11/19/19  Yes Martinique, Betty G, MD  fluticasone [facility])  50 MCG/ACT nasal spray Place 1 spray into both nostrils 2 (two) times daily. 04/03/19  Yes Martinique, Betty G, MD  metoprolol succinate (TOPROL-XL) 100 MG 24 hr tablet Take 1 tablet (100 mg total) by mouth daily. Take with or immediately following a meal. 12/27/18  Yes Martinique, Betty G, MD  naloxone [facility]) nasal spray 4 mg/0.1 mL 1 spray in each nostril x 1 if opioid overdose. 03/23/19  Yes Martinique, Betty G, MD  omeprazole (PRILOSEC) 20 MG capsule Take 1 capsule (20 mg total) by mouth daily. 03/23/19  Yes Horton, Barbette Hair, MD  ondansetron (ZOFRAN ODT) 8 MG disintegrating tablet Take 1 tablet (8 mg total) by mouth every 8 (eight) hours as needed for nausea or vomiting. 06/24/19  Yes Martinique, Betty G, MD  oxyCODONE ER [facility]) 9 MG C12A Take 1 tablet by mouth 2 (two) times daily. 12/07/19  Yes Martinique, Betty G, MD  Oxycodone HCl 10 MG TABS 1 tablet twice daily as needed, can take an extra tablet if needed.  No more than 70 tablets/month. 11/24/19  Yes Martinique, Betty G, MD  promethazine (PHENERGAN) 25 MG tablet TAKE 1 TABLET (25 MG TOTAL) BY MOUTH EVERY 12 (TWELVE) HOURS AS NEEDED. 12/04/19  Yes Martinique, Betty G, MD    Current Facility-Administered Medications  Medication Dose Route Frequency Provider Last Rate Last Admin  . acetaminophen (TYLENOL) tablet 650 mg  650 mg Oral Q6H PRN Shela Leff, MD       Or  . acetaminophen (TYLENOL) suppository 650 mg  650 mg Rectal Q6H PRN Shela Leff, MD      . ceFEPIme (MAXIPIME) 2 g in sodium chloride 0.9 % 100 mL IVPB  2 g Intravenous Q8H Shela Leff, MD   Stopped at 12/27/19 0518  . enoxaparin (LOVENOX) injection 40 mg  40 mg Subcutaneous Q24H Shela Leff, MD      . HYDROmorphone (DILAUDID) injection 1 mg  1 mg Intravenous Q4H PRN Rai, Ripudeep K, MD   1 mg at 12/27/19 0844  . metoprolol succinate (TOPROL-XL) 24 hr tablet 100 mg  100 mg Oral Daily Shela Leff, MD      . nicotine (NICODERM CQ - dosed in mg/24 hours) patch 14 mg  14 mg  Transdermal Daily Shela Leff, MD      . oxyCODONE ER C12A 1 tablet  1 tablet Oral BID Shela Leff, MD      . Oxycodone HCl TABS 10 mg  10 mg Oral BID Shela Leff, MD      . prochlorperazine (COMPAZINE) injection 5 mg  5 mg Intravenous Q4H PRN Rai, Vernelle Emerald, MD       Current Outpatient Medications  Medication Sig Dispense Refill  . amLODipine-benazepril (LOTREL) 5-20 MG capsule TAKE 1 CAPSULE BY MOUTH EVERY DAY 90 capsule 1  . fluticasone (FLONASE) 50 MCG/ACT nasal spray Place 1 spray into both nostrils 2 (two) times daily. 16 g  2  . metoprolol succinate (TOPROL-XL) 100 MG 24 hr tablet Take 1 tablet (100 mg total) by mouth daily. Take with or immediately following a meal. 90 tablet 2  . naloxone (NARCAN) nasal spray 4 mg/0.1 mL 1 spray in each nostril x 1 if opioid overdose. 1 each 1  . omeprazole (PRILOSEC) 20 MG capsule Take 1 capsule (20 mg total) by mouth daily. 30 capsule 0  . ondansetron (ZOFRAN ODT) 8 MG disintegrating tablet Take 1 tablet (8 mg total) by mouth every 8 (eight) hours as needed for nausea or vomiting. 20 tablet 1  . oxyCODONE ER (XTAMPZA ER) 9 MG C12A Take 1 tablet by mouth 2 (two) times daily. 60 capsule 0  . Oxycodone HCl 10 MG TABS 1 tablet twice daily as needed, can take an extra tablet if needed.  No more than 70 tablets/month. 70 tablet 0  . promethazine (PHENERGAN) 25 MG tablet TAKE 1 TABLET (25 MG TOTAL) BY MOUTH EVERY 12 (TWELVE) HOURS AS NEEDED. 45 tablet 1    Allergies as of 12/26/2019 - Review Complete 12/25/2019  Allergen Reaction Noted  . Cortisone Other (See Comments) 04/03/2011  . Eggs or egg-derived products Hives and Other (See Comments) 07/29/2011  . Ketorolac Hives and Other (See Comments) 11/14/2011  . Ketorolac tromethamine Hives and Shortness Of Breath 12/24/2011  . Haldol [haloperidol lactate] Other (See Comments) 08/18/2014  . Haloperidol Other (See Comments) 02/08/2015  . Hydrocortisone Hives 12/24/2011  . Iodinated  diagnostic agents Nausea And Vomiting and Other (See Comments) 12/10/2011  . Ketorolac tromethamine Hives 04/03/2011  . Reglan [metoclopramide]  06/13/2018     Review of Systems:    As per HPI, otherwise negative    Physical Exam:  Vital signs in last 24 hours: Temp:  [98.2 F (36.8 C)] 98.2 F (36.8 C) (07/31 2350) Pulse Rate:  [70-88] 88 (08/01 0841) Resp:  [12-24] 13 (08/01 0841) BP: (124-143)/(86-100) 124/100 (08/01 0841) SpO2:  [96 %-100 %] 100 % (08/01 0841) Weight:  [93.2 kg] 93.2 kg (07/31 2350)   General:   Pleasant male in NAD Head:  Normocephalic and atraumatic. Lungs:  Respirations even and unlabored. Lungs clear to auscultation bilaterally.   No wheezes, crackles, or rhonchi.  Heart:  Regular rate and rhythm; no MRG Abdomen: Minimal TTP in upper abdomen without rebound or guarding.  No peritoneal signs.  Soft, nondistended.  Normal bowel sounds. No appreciable masses or hepatomegaly.  Rectal:  Not performed.  Msk:  Symmetrical without gross deformities.  Extremities:  Without edema. Neurologic:  Alert and  oriented x4;  grossly normal neurologically. Skin:  Intact without significant lesions or rashes. Psych:  Alert and cooperative. Normal affect.  LAB RESULTS: Recent Labs    12/25/19 1353 12/27/19 0041  WBC 16.9* 11.5*  HGB 16.9 15.5  HCT 48.1 44.0  PLT 255 167   BMET Recent Labs    12/25/19 1353 12/27/19 0041  NA 139 139  K 3.7 3.5  CL 105 105  CO2 24 23  GLUCOSE 121* 99  BUN 16 14  CREATININE 1.28* 1.04  CALCIUM 9.3 9.2   LFT Recent Labs    12/27/19 0041  PROT 7.5  ALBUMIN 4.3  AST 59*  ALT 92*  ALKPHOS 83  BILITOT 0.5   PT/INR No results for input(s): LABPROT, INR in the last 72 hours.  STUDIES: CT Abdomen Pelvis Wo Contrast  Result Date: 12/25/2019 CLINICAL DATA:  44 year old male with abdominal pain, nausea vomiting since 0200 hours. EXAM: CT ABDOMEN  AND PELVIS WITHOUT CONTRAST TECHNIQUE: Multidetector CT imaging of the  abdomen and pelvis was performed following the standard protocol without IV contrast. COMPARISON:  CT Abdomen and Pelvis 03/21/2019 and earlier. FINDINGS: Lower chest: Negative. Hepatobiliary: Chronically absent gallbladder and pneumobilia. Probable hepatic steatosis again noted. Pancreas: Negative, normal noncontrast appearance of the pancreas. No lesser sac inflammation. Spleen: Negative. Adrenals/Urinary Tract: Normal adrenal glands. Stable noncontrast kidneys. No hydronephrosis or perinephric stranding. Punctate left midpole nephrolithiasis versus renal vascular calcification. Both ureters are normal to the bladder. Chronic pelvic phleboliths. Unremarkable urinary bladder. Stomach/Bowel: Mildly redundant large bowel with fluid/liquid stool and gas. No dilated colon. Sequelae of appendectomy. Similar gas and fluid-filled but nondilated small bowel. Decompressed stomach and duodenum. No free air, free fluid, mesenteric stranding. Vascular/Lymphatic: Normal caliber aorta. Mild atherosclerosis. Vascular patency is not evaluated in the absence of IV contrast. No lymphadenopathy. Reproductive: Negative. Other: No pelvic free fluid. Musculoskeletal: Negative. IMPRESSION: 1. No acute or inflammatory process identified in the noncontrast abdomen or pelvis. 2. Chronically absent gallbladder and pneumobilia. Probable chronic hepatic steatosis. 3. Punctate left nephrolithiasis versus a renal vascular calcification. Electronically Signed   By: Genevie Ann M.D.   On: 12/25/2019 22:27   DG Chest 2 View  Result Date: 12/25/2019 CLINICAL DATA:  Fever of unknown origin, admitted for pancreatitis EXAM: CHEST - 2 VIEW COMPARISON:  September 10, 2017 FINDINGS: Trachea midline. Cardiomediastinal contours and hilar structures are normal. Lungs are clear.  No signs of pleural effusion. Visualized skeletal structures on limited assessment are unremarkable. IMPRESSION: No acute cardiopulmonary disease. Electronically Signed   By: Zetta Bills M.D.   On: 12/25/2019 19:45     Impression / Plan:   1) E. coli/Enterobacteriaceae bacteremia 2) Leukocytosis-improving 3) Elevated liver enzymes-improving 4) AKI-resolved 5) Chronic abdominal pain 6) Prior history of sphincterotomy  Exact source of bacteremia unclear.  While he did have elevated liver enzymes 2 days ago, these are downtrending and elevated in hepatocellular fashion, not suggestive of biliary obstruction.  Otherwise pretty minimal GI symptoms and CT without GI wall thickening to suggest clear source of GI translocation.  -Continue antibiotics as already doing and tailor to pending sensitivity/specificity -Repeat blood cultures? -Continue to trend liver enzymes -Normal lipase and normal-appearing pancreas on this CT in many previous CTs.  I do question the diagnosis of chronic pancreatitis, which has also been questioned in the past -Pattern of distribution/location of pneumobilia seen on CT is a result of prior sphincterotomy.  No further work-up needed for this -No role for endoscopic evaluation at this juncture -P.o. intake as tolerated  Gerrit Heck, DO, [facility]    LOS: 0 days   Lavena Bullion  12/27/2019, 9:27 AM

## 2019-12-27 NOTE — H&P (Signed)
History and Physical    [name] DGU:[id number] DOB: [birth year] DOA: 12/27/2019  PCP: Martinique, Betty G, MD Patient coming from: Home  Chief Complaint: Positive blood culture  HPI: [name] is a 44 y.o. male with medical history significant of chronic pancreatitis?, chronic abdominal pain on opioids, nephrolithiasis, hypertension initially presented to the ED on 7/30 with complaints of abdominal pain, nausea, and vomiting.  Labs done at that time showing WBC count 16.9.  AST 132, ALT 94.  Lipase normal.  While in the ED he developed fever and chills.  Abdominal CT done at that time did not show any acute findings to explain patient's fever.  Chest x-ray without evidence of pneumonia.  UA not suggestive of infection.  Covid swab negative.  Rapid HIV screen was negative.  Hepatitis panel negative.  Testing for Lyme disease and RMSF was sent off.  Blood and urine cultures ordered.  Patient was discharged from the ED.  His blood culture came back positive for E. coli and Enterobacteriaceae.  He was advised to return to the ED.  Patient states he has continued to have fever since he left the ED.  States he has chronic abdominal pain from pancreatitis which is unchanged from baseline.  He continues to feel nauseous but is no longer vomiting.  Denies cough, shortness of breath, or chest pain.  Denies dysuria or urinary frequency/urgency.  No other complaints.  ED Course: Afebrile.  Not tachycardic or hypotensive.  WBC count 11.5.  Leukocytosis improved compared to labs done 2 days ago.  AST 59 and ALT 92.  Mild transaminitis improved compared to labs done 2 days ago.  Repeat Covid swab pending.  Patient was given fentanyl, Zofran, cefepime, and 1.5 L normal saline boluses.  Review of Systems:  All systems reviewed and apart from history of presenting illness, are negative.  Past Medical History:  Diagnosis Date  . Chronic abdominal pain   . Diverticulitis   . Drug-seeking behavior   .  Hypertension   . Kidney stones   . Pancreatitis, chronic ([place]) 05/28/2005    Past Surgical History:  Procedure Laterality Date  . APPENDECTOMY    . CHOLECYSTECTOMY    . ERCP W/ METAL STENT PLACEMENT    . KIDNEY STONE SURGERY       reports that he has been smoking cigarettes. He has been smoking about 0.50 packs per day. He has never used smokeless tobacco. He reports current alcohol use. He reports that he does not use drugs.  Allergies  Allergen Reactions  . Cortisone Other (See Comments)    drops potassium level "bottoms out" potassium level drops potassium level Bottoms out potassium  "bottoms out" potassium level Other reaction(s): Other (See Comments) Bottoms out potassium  . Eggs Or Egg-Derived Products Hives and Other (See Comments)    Rash  Rash  . Ketorolac Hives and Other (See Comments)    Hives, slightly labored breathing  Hives, slightly labored breathing  . Ketorolac Tromethamine Hives and Shortness Of Breath  . Haldol [Haloperidol Lactate] Other (See Comments)    "jittery"   . Haloperidol Other (See Comments)    "jittery" "jittery"   . Hydrocortisone Hives    Also drops potassium level  . Iodinated Diagnostic Agents Nausea And Vomiting and Other (See Comments)    Hives Hives Hives Hives  . Ketorolac Tromethamine Hives  . Reglan [Metoclopramide]     Face "draws" and gets figgety    Family History  Problem Relation Age of  Onset  . Cancer Mother   . Early death Mother   . Hypertension Mother   . Heart failure Father   . Hypertension Father   . Heart disease Father   . Hyperlipidemia Father   . Pancreatic cancer Paternal Grandmother        possibly  . Pancreatic cancer Paternal Aunt   . Colon cancer Neg Hx     Prior to Admission medications   Medication Sig Start Date End Date Taking? Authorizing Provider  amLODipine-benazepril (LOTREL) 5-20 MG capsule TAKE 1 CAPSULE BY MOUTH EVERY DAY 11/19/19  Yes Martinique, Betty G, MD  fluticasone  [facility]) 50 MCG/ACT nasal spray Place 1 spray into both nostrils 2 (two) times daily. 04/03/19  Yes Martinique, Betty G, MD  metoprolol succinate (TOPROL-XL) 100 MG 24 hr tablet Take 1 tablet (100 mg total) by mouth daily. Take with or immediately following a meal. 12/27/18  Yes Martinique, Betty G, MD  naloxone [facility]) nasal spray 4 mg/0.1 mL 1 spray in each nostril x 1 if opioid overdose. 03/23/19  Yes Martin[place], Betty G, MD  omeprazole (PRILOSEC) 20 MG capsule Take 1 capsule (20 mg total) by mouth daily. 03/23/19  Yes Horton, Barbette Hair, MD  ondansetron (ZOFRAN ODT) 8 MG disintegrating tablet Take 1 tablet (8 mg total) by mouth every 8 (eight) hours as needed for nausea or vomiting. 06/24/19  Yes Martinique, Betty G, MD  oxyCODONE ER [facility] ER) 9 MG C12A Take 1 tablet by mouth 2 (two) times daily. 12/07/19  Yes [place], Betty G, MD  Oxycodone HCl 10 MG TABS 1 tablet twice daily as needed, can take an extra tablet if needed.  No more than 70 tablets/month. 11/24/19  Yes Martinique, Betty G, MD  promethazine (PHENERGAN) 25 MG tablet TAKE 1 TABLET (25 MG TOTAL) BY MOUTH EVERY 12 (TWELVE) HOURS AS NEEDED. 12/04/19  Yes Martinique, Betty G, MD    Physical Exam: Vitals:   12/26/19 2350  BP: (!) 132/89  Pulse: 85  Resp: 16  Temp: 98.2 F (36.8 C)  TempSrc: Oral  SpO2: 96%  Weight: (!) 93.2 kg  Height: 6' 1" (1.854 m)    Physical Exam Constitutional:      General: He is not in acute distress. HENT:     Head: Normocephalic and atraumatic.     Mouth/Throat:     Mouth: Mucous membranes are moist.     Pharynx: Oropharynx is clear.  Eyes:     Extraocular Movements: Extraocular movements intact.     Conjunctiva/sclera: Conjunctivae normal.  Cardiovascular:     Rate and Rhythm: Normal rate and regular rhythm.     Pulses: Normal pulses.  Pulmonary:     Effort: Pulmonary effort is normal. No respiratory distress.     Breath sounds: Normal breath sounds. No wheezing or rales.  Abdominal:     General: Bowel sounds are  normal. There is no distension.     Palpations: Abdomen is soft.     Tenderness: There is no abdominal tenderness. There is no guarding.  Musculoskeletal:        General: No swelling or tenderness.     Cervical back: Normal range of motion and neck supple.  Skin:    General: Skin is warm and dry.  Neurological:     General: No focal deficit present.     Mental Status: He is alert and oriented to person, place, and time.     Labs on Admission: I have personally reviewed following labs and  imaging studies  CBC: Recent Labs  Lab 12/25/19 1353 12/27/19 0041  WBC 16.9* 11.5*  HGB 16.9 15.5  HCT 48.1 44.0  MCV 86.0 86.4  PLT 255 027   Basic Metabolic Panel: Recent Labs  Lab 12/25/19 1353 12/27/19 0041  NA 139 139  K 3.7 3.5  CL 105 105  CO2 24 23  GLUCOSE 121* 99  BUN 16 14  CREATININE 1.28* 1.04  CALCIUM 9.3 9.2   GFR: Estimated Creatinine Clearance: 104.6 mL/min (by C-G formula based on SCr of 1.04 mg/dL). Liver Function Tests: Recent Labs  Lab 12/25/19 1353 12/27/19 0041  AST 132* 59*  ALT 94* 92*  ALKPHOS 108 83  BILITOT 0.7 0.5  PROT 7.5 7.5  ALBUMIN 4.2 4.3   Recent Labs  Lab 12/25/19 1353  LIPASE 36   No results for input(s): AMMONIA in the last 168 hours. Coagulation Profile: No results for input(s): INR, PROTIME in the last 168 hours. Cardiac Enzymes: No results for input(s): CKTOTAL, CKMB, CKMBINDEX, TROPONINI in the last 168 hours. BNP (last 3 results) No results for input(s): PROBNP in the last 8760 hours. HbA1C: No results for input(s): HGBA1C in the last 72 hours. CBG: No results for input(s): GLUCAP in the last 168 hours. Lipid Profile: No results for input(s): CHOL, HDL, LDLCALC, TRIG, CHOLHDL, LDLDIRECT in the last 72 hours. Thyroid Function Tests: No results for input(s): TSH, T4TOTAL, FREET4, T3FREE, THYROIDAB in the last 72 hours. Anemia Panel: No results for input(s): VITAMINB12, FOLATE, FERRITIN, TIBC, IRON, RETICCTPCT in the  last 72 hours. Urine analysis:    Component Value Date/Time   COLORURINE YELLOW 12/25/2019 1824   APPEARANCEUR CLEAR 12/25/2019 1824   LABSPEC >1.030 (H) 12/25/2019 1824   PHURINE 6.0 12/25/2019 1824   GLUCOSEU NEGATIVE 12/25/2019 1824   GLUCOSEU NEGATIVE 04/18/2018 1150   HGBUR TRACE (A) 12/25/2019 1824   BILIRUBINUR NEGATIVE 12/25/2019 1824   KETONESUR NEGATIVE 12/25/2019 1824   PROTEINUR NEGATIVE 12/25/2019 1824   UROBILINOGEN 0.2 04/18/2018 1150   NITRITE NEGATIVE 12/25/2019 1824   LEUKOCYTESUR NEGATIVE 12/25/2019 1824    Radiological Exams on Admission: CT Abdomen Pelvis Wo Contrast  Result Date: 12/25/2019 CLINICAL DATA:  44 year old male with abdominal pain, nausea vomiting since 0200 hours. EXAM: CT ABDOMEN AND PELVIS WITHOUT CONTRAST TECHNIQUE: Multidetector CT imaging of the abdomen and pelvis was performed following the standard protocol without IV contrast. COMPARISON:  CT Abdomen and Pelvis 03/21/2019 and earlier. FINDINGS: Lower chest: Negative. Hepatobiliary: Chronically absent gallbladder and pneumobilia. Probable hepatic steatosis again noted. Pancreas: Negative, normal noncontrast appearance of the pancreas. No lesser sac inflammation. Spleen: Negative. Adrenals/Urinary Tract: Normal adrenal glands. Stable noncontrast kidneys. No hydronephrosis or perinephric stranding. Punctate left midpole nephrolithiasis versus renal vascular calcification. Both ureters are normal to the bladder. Chronic pelvic phleboliths. Unremarkable urinary bladder. Stomach/Bowel: Mildly redundant large bowel with fluid/liquid stool and gas. No dilated colon. Sequelae of appendectomy. Similar gas and fluid-filled but nondilated small bowel. Decompressed stomach and duodenum. No free air, free fluid, mesenteric stranding. Vascular/Lymphatic: Normal caliber aorta. Mild atherosclerosis. Vascular patency is not evaluated in the absence of IV contrast. No lymphadenopathy. Reproductive: Negative. Other: No  pelvic free fluid. Musculoskeletal: Negative. IMPRESSION: 1. No acute or inflammatory process identified in the noncontrast abdomen or pelvis. 2. Chronically absent gallbladder and pneumobilia. Probable chronic hepatic steatosis. 3. Punctate left nephrolithiasis versus a renal vascular calcification. Electronically Signed   By: Genevie Ann M.D.   On: 12/25/2019 22:27   DG Chest 2 View  Result Date: 12/25/2019 CLINICAL DATA:  Fever of unknown origin, admitted for pancreatitis EXAM: CHEST - 2 VIEW COMPARISON:  September 10, 2017 FINDINGS: Trachea midline. Cardiomediastinal contours and hilar structures are normal. Lungs are clear.  No signs of pleural effusion. Visualized skeletal structures on limited assessment are unremarkable. IMPRESSION: No acute cardiopulmonary disease. Electronically Signed   By: Zetta Bills M.D.   On: 12/25/2019 19:45    Assessment/Plan Principal Problem:   Bacteremia Active Problems:   Hypertension, essential, benign   Chronic abdominal pain   Chronic pain disorder   QT prolongation  E. coli and Enterobacteriaceae species bacteremia: Patient has continued to have fevers at home for the past 2 days.  No fever on vitals done in the ED.  No tachycardia or hypotension.  Leukocytosis has improved compared to labs done 2 days ago.  Blood cultures drawn on 7/30 positive for E. coli and Enterobacteriaceae species (sensitivities pending).  UA was not suggestive of infection and CT without obvious infectious source at the time of work-up done 2 days ago when patient developed fevers and chills. -Continue cefepime.  Antibiotic sensitivity testing from previous blood culture pending.  Order repeat blood cultures.  Urine culture pending.  Nausea, chronic abdominal pain: Transaminases mildly elevated and improved compared to labs done 2 days ago.  Alk phos and T bili normal.  Lipase was normal.  Abdominal exam benign. CT done 2 days ago without an explanation for the patient's  symptoms. -Compazine as needed for nausea/vomiting  Chronic pain syndrome: On chronic opioids. -Continue home oxycodone  Hypertension -Continue home metoprolol  Borderline QT prolongation: Seen on EKG done 7/30. -Cardiac monitoring.  Monitor potassium and magnesium levels.  Repeat EKG.  Avoid QT prolonging drugs if possible.  DVT prophylaxis: Lovenox Code Status: Full code Family Communication: No family available at this time. Disposition Plan: Status is: Inpatient  Remains inpatient appropriate because:IV treatments appropriate due to intensity of illness or inability to take PO   Dispo: The patient is from: Home              Anticipated d/c is to: Home              Anticipated d/c date is: 2 days              Patient currently is not medically stable to d/c.  The medical decision making on this patient was of high complexity and the patient is at high risk for clinical deterioration, therefore this is a level 3 visit.  Shela Leff MD [facility]  If 7PM-7AM, please contact night-coverage [email or web address]  12/27/2019, 5:03 AM

## 2019-12-27 NOTE — ED Provider Notes (Signed)
[facility] DEPT Provider Note   CSN: [id number] Arrival date & time: 12/26/19  2309   Time seen 1:54 AM  History Chief Complaint  Patient presents with  . Abnormal Lab    Dayron Odland is a 44 y.o. male.  HPI   Patient was seen in the ED on July 30 when he presented with abdominal pain and fever, he did have fever up to 103 at 8 PM on July 30.  He had a CT scan at that time which did not show any acute reason for his temperature.  He was discharged home.  However his blood cultures did come back positive with E. coli and enterobacteria species.  Patient was called and told to come back to the ED for IV antibiotics.  He states he still having left upper quadrant pain.  He states he has vomited twice since he left the ED.  He denies diarrhea.  Patient is requesting pain and nausea medicine.  PCP Martinique, Betty G, MD   Past Medical History:  Diagnosis Date  . Chronic abdominal pain   . Diverticulitis   . Drug-seeking behavior   . Hypertension   . Kidney stones   . Pancreatitis, chronic ([place]) 05/28/2005    Patient Active Problem List   Diagnosis Date Noted  . Chronic pain disorder 08/29/2018  . Back pain, chronic 08/29/2018  . Nausea and vomiting in adult 02/25/2018  . Hypertension, essential, benign 01/18/2018  . Chronic biliary pancreatitis ([place]) 01/18/2018  . Chronic abdominal pain 01/14/2018    Past Surgical History:  Procedure Laterality Date  . APPENDECTOMY    . CHOLECYSTECTOMY    . ERCP W/ METAL STENT PLACEMENT    . KIDNEY STONE SURGERY         Family History  Problem Relation Age of Onset  . Cancer Mother   . Early death Mother   . Hypertension Mother   . Heart failure Father   . Hypertension Father   . Heart disease Father   . Hyperlipidemia Father   . Pancreatic cancer Paternal Grandmother        possibly  . Pancreatic cancer Paternal Aunt   . Colon cancer Neg Hx     Social History   Tobacco Use  . Smoking  status: Current Every Day Smoker    Packs/day: 0.50    Types: Cigarettes  . Smokeless tobacco: Never Used  Vaping Use  . Vaping Use: Never used  Substance Use Topics  . Alcohol use: Yes    Comment: occ  . Drug use: No    Home Medications Prior to Admission medications   Medication Sig Start Date End Date Taking? Authorizing Provider  amLODipine-benazepril (LOTREL) 5-20 MG capsule TAKE 1 CAPSULE BY MOUTH EVERY DAY 11/19/19   Martin[place], Betty G, MD  fluticasone [facility]) 50 MCG/ACT nasal spray Place 1 spray into both nostrils 2 (two) times daily. 04/03/19   [place], Betty G, MD  metoprolol succinate (TOPROL-XL) 100 MG 24 hr tablet Take 1 tablet (100 mg total) by mouth daily. Take with or immediately following a meal. 12/27/18   Martin[place], Betty G, MD  naloxone [facility]) nasal spray 4 mg/0.1 mL 1 spray in each nostril x 1 if opioid overdose. 03/23/19   [place], Betty G, MD  omeprazole (PRILOSEC) 20 MG capsule Take 1 capsule (20 mg total) by mouth daily. 03/23/19   Horton, Barbette Hair, MD  ondansetron (ZOFRAN ODT) 8 MG disintegrating tablet Take 1 tablet (8 mg total) by  mouth every 8 (eight) hours as needed for nausea or vomiting. 06/24/19   [place], Betty G, MD  oxyCODONE ER [facility] ER) 9 MG C12A Take 1 tablet by mouth 2 (two) times daily. 12/07/19   [place], Betty G, MD  Oxycodone HCl 10 MG TABS 1 tablet twice daily as needed, can take an extra tablet if needed.  No more than 70 tablets/month. 11/24/19   [place], Betty G, MD  promethazine (PHENERGAN) 25 MG suppository Place 1 suppository (25 mg total) rectally every 8 (eight) hours as needed for nausea or vomiting. 02/20/19   [place], Betty G, MD  promethazine (PHENERGAN) 25 MG tablet TAKE 1 TABLET (25 MG TOTAL) BY MOUTH EVERY 12 (TWELVE) HOURS AS NEEDED. 12/04/19   [place], Betty G, MD  sucralfate (CARAFATE) 1 g tablet Take 1 tablet (1 g total) by mouth 4 (four) times daily -  with meals and at bedtime. 03/23/19   Horton, Barbette Hair, MD    Allergies      Cortisone, Eggs or egg-derived products, Ketorolac, Ketorolac tromethamine, Haldol [haloperidol lactate], Haloperidol, Hydrocortisone, Iodinated diagnostic agents, Ketorolac tromethamine, and Reglan [metoclopramide]  Review of Systems   Review of Systems  All other systems reviewed and are negative.   Physical Exam Updated Vital Signs BP (!) 132/89 (BP Location: Left Arm)   Pulse 85   Temp 98.2 F (36.8 C) (Oral)   Resp 16   Ht 6\' 1"  (1.854 m)   Wt (!) 93.2 kg   SpO2 96%   BMI 27.11 kg/m   Physical Exam Vitals and nursing note reviewed.  Constitutional:      Appearance: Normal appearance. He is normal weight.     Comments: Patient is laying on his right side in fetal position.  HENT:     Head: Normocephalic.     Right Ear: External ear normal.     Left Ear: External ear normal.  Eyes:     Extraocular Movements: Extraocular movements intact.     Conjunctiva/sclera: Conjunctivae normal.  Cardiovascular:     Rate and Rhythm: Normal rate and regular rhythm.     Pulses: Normal pulses.     Heart sounds: Normal heart sounds.  Pulmonary:     Effort: Pulmonary effort is normal. No respiratory distress.     Breath sounds: Normal breath sounds.  Abdominal:     General: Abdomen is flat. Bowel sounds are increased.     Palpations: Abdomen is soft.     Tenderness: There is abdominal tenderness in the left upper quadrant. There is no guarding or rebound.  Musculoskeletal:        General: Normal range of motion.     Cervical back: Normal range of motion.  Skin:    General: Skin is warm and dry.  Neurological:     General: No focal deficit present.     Mental Status: He is alert and oriented to person, place, and time.     Cranial Nerves: No cranial nerve deficit.  Psychiatric:        Mood and Affect: Mood normal.        Behavior: Behavior normal.        Thought Content: Thought content normal.     ED Results / Procedures / Treatments   Labs (all labs ordered are listed,  but only abnormal results are displayed) Results for orders placed or performed during the hospital encounter of 12/27/19  Comprehensive metabolic panel  Result Value Ref Range   Sodium 139 135 - 145  mmol/L   Potassium 3.5 3.5 - 5.1 mmol/L   Chloride 105 98 - 111 mmol/L   CO2 23 22 - 32 mmol/L   Glucose, Bld 99 70 - 99 mg/dL   BUN 14 6 - 20 mg/dL   Creatinine, Ser 1.04 0.61 - 1.24 mg/dL   Calcium 9.2 8.9 - 10.3 mg/dL   Total Protein 7.5 6.5 - 8.1 g/dL   Albumin 4.3 3.5 - 5.0 g/dL   AST 59 (H) 15 - 41 U/L   ALT 92 (H) 0 - 44 U/L   Alkaline Phosphatase 83 38 - 126 U/L   Total Bilirubin 0.5 0.3 - 1.2 mg/dL   GFR calc non Af Amer >60 >60 mL/min   GFR calc Af Amer >60 >60 mL/min   Anion gap 11 5 - 15  CBC  Result Value Ref Range   WBC 11.5 (H) 4.0 - 10.5 K/uL   RBC 5.09 4.22 - 5.81 MIL/uL   Hemoglobin 15.5 13.0 - 17.0 g/dL   HCT 44.0 39 - 52 %   MCV 86.4 80.0 - 100.0 fL   MCH 30.5 26.0 - 34.0 pg   MCHC 35.2 30.0 - 36.0 g/dL   RDW 12.9 11.5 - 15.5 %   Platelets 167 150 - 400 K/uL   nRBC 0.0 0.0 - 0.2 %   Laboratory interpretation all normal except leukocytosis, minor elevation of LFTs    EKG None  Radiology CT Abdomen Pelvis Wo Contrast  Result Date: 12/25/2019 CLINICAL DATA:  44 year old male with abdominal pain, nausea vomiting since 0200 hours. EXAM: CT ABDOMEN AND PELVIS WITHOUT CONTRAST TECHNIQUE: Multidetector CT imaging of the abdomen and pelvis was performed following the standard protocol without IV contrast. COMPARISON:  CT Abdomen and Pelvis 03/21/2019 and earlier. FINDINGS: Lower chest: Negative. Hepatobiliary: Chronically absent gallbladder and pneumobilia. Probable hepatic steatosis again noted. Pancreas: Negative, normal noncontrast appearance of the pancreas. No lesser sac inflammation. Spleen: Negative. Adrenals/Urinary Tract: Normal adrenal glands. Stable noncontrast kidneys. No hydronephrosis or perinephric stranding. Punctate left midpole nephrolithiasis  versus renal vascular calcification. Both ureters are normal to the bladder. Chronic pelvic phleboliths. Unremarkable urinary bladder. Stomach/Bowel: Mildly redundant large bowel with fluid/liquid stool and gas. No dilated colon. Sequelae of appendectomy. Similar gas and fluid-filled but nondilated small bowel. Decompressed stomach and duodenum. No free air, free fluid, mesenteric stranding. Vascular/Lymphatic: Normal caliber aorta. Mild atherosclerosis. Vascular patency is not evaluated in the absence of IV contrast. No lymphadenopathy. Reproductive: Negative. Other: No pelvic free fluid. Musculoskeletal: Negative. IMPRESSION: 1. No acute or inflammatory process identified in the noncontrast abdomen or pelvis. 2. Chronically absent gallbladder and pneumobilia. Probable chronic hepatic steatosis. 3. Punctate left nephrolithiasis versus a renal vascular calcification. Electronically Signed   By: Genevie Ann M.D.   On: 12/25/2019 22:27   DG Chest 2 View  Result Date: 12/25/2019 CLINICAL DATA:  Fever of unknown origin, admitted for pancreatitis EXAM: CHEST - 2 VIEW COMPARISON:  September 10, 2017 FINDINGS: Trachea midline. Cardiomediastinal contours and hilar structures are normal. Lungs are clear.  No signs of pleural effusion. Visualized skeletal structures on limited assessment are unremarkable. IMPRESSION: No acute cardiopulmonary disease. Electronically Signed   By: Zetta Bills M.D.   On: 12/25/2019 19:45    Procedures Procedures (including critical care time)  Medications Ordered in ED Medications  ceFEPIme (MAXIPIME) 2 g in sodium chloride 0.9 % 100 mL IVPB (has no administration in time range)  ceFEPIme (MAXIPIME) 2 g in sodium chloride 0.9 % 100 mL IVPB (has  no administration in time range)  fentaNYL (SUBLIMAZE) injection 50 mcg (has no administration in time range)  sodium chloride 0.9 % bolus 1,000 mL (has no administration in time range)  sodium chloride 0.9 % bolus 500 mL (has no administration  in time range)  ondansetron (ZOFRAN) injection 4 mg (has no administration in time range)    ED Course  I have reviewed the triage vital signs and the nursing notes.  Pertinent labs & imaging results that were available during my care of the patient were reviewed by me and considered in my medical decision making (see chart for details).    MDM Rules/Calculators/A&P                          After reviewing patient's blood culture results he was started on Maxipime.  The sensitivities have not resulted yet.  Review of the patient's [employer] shows he gets #60 [facility] ER 9 mg capsules monthly, last filled July 12, #70 oxycodone 10 mg tablets last filled July 4.  These were prescribed by the same prescriber.  Patient was given IV fluids and IV pain and nausea medication.  Consult the hospitalist was made.  3:24 AM Dr. Marlowe Sax, hospitalist will admit patient.  Final Clinical Impression(s) / ED Diagnoses Final diagnoses:  Bacteremia  Chronic abdominal pain  Nausea and vomiting, intractability of vomiting not specified, unspecified vomiting type    Rx / DC Orders   Plan admission  Rolland Porter, MD, Barbette Or, MD 12/27/19 [phone]

## 2019-12-27 NOTE — ED Notes (Signed)
Pt moved from ED stretcher to hospital bed.  

## 2019-12-27 NOTE — Progress Notes (Signed)
Pharmacy Antibiotic Note  [name] is a 44 y.o. male admitted on 12/27/2019 with bacteremia.  Pharmacy has been consulted for cefepime dosing.  Pt was called to return to ED after blood cultures grew E.coli  Plan:  Cefepime 2 gr IV q8h    F/u BCx sensitivities  Monitor clinical course, renal function, cultures as available    Height: 6\' 1"  (185.4 cm) Weight: (!) 93.2 kg (205 lb 8 oz) IBW/kg (Calculated) : 79.9  Temp (24hrs), Avg:98.2 F (36.8 C), Min:98.2 F (36.8 C), Max:98.2 F (36.8 C)  Recent Labs  Lab 12/25/19 1353 12/27/19 0041  WBC 16.9* 11.5*  CREATININE 1.28* 1.04    Estimated Creatinine Clearance: 104.6 mL/min (by C-G formula based on SCr of 1.04 mg/dL).    Allergies  Allergen Reactions   Cortisone Other (See Comments)    drops potassium level "bottoms out" potassium level drops potassium level Bottoms out potassium  "bottoms out" potassium level Other reaction(s): Other (See Comments) Bottoms out potassium   Eggs Or Egg-Derived Products Hives and Other (See Comments)    Rash  Rash   Ketorolac Hives and Other (See Comments)    Hives, slightly labored breathing  Hives, slightly labored breathing   Ketorolac Tromethamine Hives and Shortness Of Breath   Haldol [Haloperidol Lactate] Other (See Comments)    "jittery"    Haloperidol Other (See Comments)    "jittery" "jittery"    Hydrocortisone Hives    Also drops potassium level   Iodinated Diagnostic Agents Nausea And Vomiting and Other (See Comments)    Hives Hives Hives Hives   Ketorolac Tromethamine Hives   Reglan [Metoclopramide]     Face "draws" and gets figgety    Antimicrobials this admission: 8/1 cefepime >>    Dose adjustments this admission:   Microbiology results: 7/30 BCx: E.coli    Thank you for allowing pharmacy to be a part of this patients care.  Royetta Asal, PharmD, BCPS 12/27/2019 2:19 AM

## 2019-12-27 NOTE — Progress Notes (Signed)
Patient seen and examined, admitted this morning by Dr. Marlowe Sax  Briefly 44 year old male with reported history of chronic pancreatitis, chronic abdominal pain on narcotics, history of cholecystectomy, appendectomy, nephrolithiasis, hypertension.  Patient had initially presented to ED on 7/30 with nausea vomiting and abdominal pain.  He had reported pain in the right upper quadrant and acute on chronic epigastric and left upper abdominal pain typical of his pancreatitis attacks.  Also reported fevers and chills. Patient had leukocytosis, mild acute kidney injury, mild transaminitis, normal lipase.  CT abdomen was fairly benign.  UA was normal, Covid negative, chest x-ray negative.  Blood cultures were drawn. Patient was discharged home. He was called to return back due to blood cultures growing E. Coli.  BP (!) 124/100 (BP Location: Right Arm)   Pulse 88   Temp 98.2 F (36.8 C) (Oral)   Resp 13   Ht 6\' 1"  (1.854 m)   Wt (!) 93.2 kg   SpO2 100%   BMI 27.11 kg/m   On exam,  Abdomen :minimal TTP in the right upper quadrant and epigastric region. Labs reviewed, leukocytosis trending down, LFTs improving.  Prior LFTs 09/2019 were completely normal. BUN/creatinine normalized, creatinine was 1.17/30, now 1.0  A/p E Enterobacter/ E. coli bacteremia -Unclear etiology, UA repeated on 8/1 negative, repeat blood cultures -Continue IV cefepime, placed on for liquid diet, advance as tolerated -Given elevated LFTs, no other etiology found, GI consulted, possibly transient bacteremia from passed gallstone/choledocholithiasis.  No acute process identified, chronically absent gallbladder and pneumobilia.  No perinephric stranding or pyelonephritis.  Repeat UA negative.  No respiratory symptoms. -Per GI, continue antibiotics and follow sensitivities, trend LFTs.  If spiking fevers, worsening leukocytosis or infectious signs, will consult infectious disease -Continue pain control   Jahzeel Poythress  M.D. [facility] 12/27/2019, 11:04 AM

## 2019-12-28 ENCOUNTER — Inpatient Hospital Stay (HOSPITAL_COMMUNITY): Payer: BLUE CROSS/BLUE SHIELD

## 2019-12-28 DIAGNOSIS — G8929 Other chronic pain: Secondary | ICD-10-CM

## 2019-12-28 DIAGNOSIS — I10 Essential (primary) hypertension: Secondary | ICD-10-CM

## 2019-12-28 DIAGNOSIS — R9431 Abnormal electrocardiogram [ECG] [EKG]: Secondary | ICD-10-CM

## 2019-12-28 DIAGNOSIS — R7881 Bacteremia: Principal | ICD-10-CM

## 2019-12-28 DIAGNOSIS — R109 Unspecified abdominal pain: Secondary | ICD-10-CM

## 2019-12-28 LAB — CBC
HCT: 39 % (ref 39.0–52.0)
Hemoglobin: 13.7 g/dL (ref 13.0–17.0)
MCH: 30 pg (ref 26.0–34.0)
MCHC: 35.1 g/dL (ref 30.0–36.0)
MCV: 85.5 fL (ref 80.0–100.0)
Platelets: 137 10*3/uL — ABNORMAL LOW (ref 150–400)
RBC: 4.56 MIL/uL (ref 4.22–5.81)
RDW: 12.8 % (ref 11.5–15.5)
WBC: 10.7 10*3/uL — ABNORMAL HIGH (ref 4.0–10.5)
nRBC: 0 % (ref 0.0–0.2)

## 2019-12-28 LAB — COMPREHENSIVE METABOLIC PANEL
ALT: 60 U/L — ABNORMAL HIGH (ref 0–44)
AST: 30 U/L (ref 15–41)
Albumin: 3.5 g/dL (ref 3.5–5.0)
Alkaline Phosphatase: 70 U/L (ref 38–126)
Anion gap: 11 (ref 5–15)
BUN: 11 mg/dL (ref 6–20)
CO2: 22 mmol/L (ref 22–32)
Calcium: 8.9 mg/dL (ref 8.9–10.3)
Chloride: 104 mmol/L (ref 98–111)
Creatinine, Ser: 0.84 mg/dL (ref 0.61–1.24)
GFR calc Af Amer: >60 mL/min (ref >60)
GFR calc non Af Amer: >60 mL/min (ref >60)
Glucose, Bld: 106 mg/dL — ABNORMAL HIGH (ref 70–99)
Potassium: 3.3 mmol/L — ABNORMAL LOW (ref 3.5–5.1)
Sodium: 137 mmol/L (ref 135–145)
Total Bilirubin: 0.4 mg/dL (ref 0.3–1.2)
Total Protein: 6.5 g/dL (ref 6.5–8.1)

## 2019-12-28 LAB — CULTURE, BLOOD (ROUTINE X 2): Special Requests: ADEQUATE

## 2019-12-28 LAB — URINE CULTURE: Culture: NO GROWTH

## 2019-12-28 LAB — B. BURGDORFI ANTIBODIES: B burgdorferi Ab IgG+IgM: <0.91 {ISR} (ref 0.00–0.90)

## 2019-12-28 MED ORDER — DIAZEPAM 2 MG PO TABS
2.0000 mg | ORAL_TABLET | Freq: Once | ORAL | Status: AC
  Administered 2019-12-28: 2 mg via ORAL
  Filled 2019-12-28: qty 1

## 2019-12-28 MED ORDER — OXYCODONE HCL 5 MG PO TABS
5.0000 mg | ORAL_TABLET | Freq: Four times a day (QID) | ORAL | Status: DC | PRN
  Administered 2019-12-28 – 2019-12-30 (×8): 5 mg via ORAL
  Filled 2019-12-28 (×8): qty 1

## 2019-12-28 MED ORDER — GADOBUTROL 1 MMOL/ML IV SOLN
9.0000 mL | Freq: Once | INTRAVENOUS | Status: AC | PRN
  Administered 2019-12-28: 9 mL via INTRAVENOUS

## 2019-12-28 NOTE — ED Notes (Signed)
Called to give report receiving nurse at lunch will follow up in 10 min

## 2019-12-28 NOTE — Progress Notes (Addendum)
[facility] Progress Note  CC:  Elevated LFTs  Subjective: He slept intermittently throughout the night. No severe abdominal pain at this time. No N/V. He passed a "dark, black loose stool with bits" early Sunday am around 1 am and a similar black BM later Sunday evening. No BM yet today.  No Pepto bismol or iron use. He stated his BMs previously were brown and formed. No bright red rectal bleeding.   He has an inflamed right eyelid chalazion which he stated has worsened over the past week. No exudate.    Objective:  Vital signs in last 24 hours: Temp:  [98.1 F (36.7 C)-101.2 F (38.4 C)] 99.3 F (37.4 C) (08/02 0741) Pulse Rate:  [70-130] 72 (08/02 0741) Resp:  [11-32] 11 (08/02 0741) BP: (114-154)/(83-103) 135/94 (08/02 0741) SpO2:  [94 %-100 %] 95 % (08/02 0741)   General:  Alert  44 year old male in NAD.  Eyes: No scleral icterus. Inflamed nodule to the right upper eyelid. Granulation tissue. No exudate.  Mouth:  Upper and lower dentures intact. No ulcers or lesions.  Heart: RRR, no murmur.  Pulm:  Breath sounds clear throughout.  Abdomen: Mild LUQ tenderness without rebound or guarding. + BS x 4 quads. No HSM. Rectal: Patient declined rectal exam, prefers to complete FOBT with next BM. Extremities:  Without edema. Neurologic:  Alert and  oriented x4;  grossly normal neurologically. Psych:  Alert and cooperative. Normal mood and affect.  Intake/Output from previous day: 08/01 0701 - 08/02 0700 In: 200 [IV Piggyback:200] Out: -  Intake/Output this shift: No intake/output data recorded.  Lab Results: Recent Labs    12/25/19 1353 12/27/19 0041 12/28/19 0502  WBC 16.9* 11.5* 10.7*  HGB 16.9 15.5 13.7  HCT 48.1 44.0 39.0  PLT 255 167 137*   BMET Recent Labs    12/25/19 1353 12/27/19 0041 12/28/19 0502  NA 139 139 137  K 3.7 3.5 3.3*  CL 105 105 104  CO2 24 23 22   GLUCOSE 121* 99 106*  BUN 16 14 11   CREATININE 1.28* 1.04 0.84  CALCIUM  9.3 9.2 8.9   LFT Recent Labs    12/28/19 0502  PROT 6.5  ALBUMIN 3.5  AST 30  ALT 60*  ALKPHOS 70  BILITOT 0.4   PT/INR No results for input(s): LABPROT, INR in the last 72 hours. Hepatitis Panel Recent Labs    12/25/19 2228  HEPBSAG NON REACTIVE  HCVAB NON REACTIVE  HEPAIGM NON REACTIVE  HEPBIGM NON REACTIVE    No results found.  Assessment / Plan:  14. 43 year old male with fever, abdominal pain, N/V and elevated LFTs. CTAP WO contrast 7/30 consistent with s/p CCY with pneumobilia and hepatic steatosis. CT report does not comment on the common bile duct. Normal pancreas.  Negative Sars Coronavirus 19. Negative acute hepatitis panel. LFTs drifting downward. Today AST 30. ALT 60. Patient reported passing black loose stool with solid bits on 8/1 x 2. Hg 15.5 -> 13.7, most likely dilutional.  -Repeat hepatic panel and CBC in am -Pain management per the hospitalist -Continue Omeprazole 20mg  po QD -Continue Zofran/Phenergan PRN -Soft diet as tolerated   2. Enterobacter/ E. coli bacteremia, etiology unclear. Urine cultures no growth at 24 hours.  WBC 10.7. Tmax 101.2 on 8/2. Today he is afebrile. He is on Ceftriaxone 2gm IV Q 24 hrs.  3. History of chronic pancreatitis. EUS (06/2005, Dr. Ardis Hughs while he was seeing [facility]): CBD 6.9 mm,  CHD 9.5 mm, no filling defects.  Normal HOP, somewhat lobular body/tail. ERCP (unsure when, [facility]): Sphincterotomy performed and sounds like pancreatic duct placed and then later removed  4. History of colon polyps. Colonoscopy 05/2018 identified 1 small tubular adenoma, one benign inflammatory polyp, otherwise normal -Next colonoscopy due 05/2023.  5. Chronic narcotic use due to chronic pancreatitis associated abdominal pain  6. Hypokalemia. K+ 3.3 -Replacement per the hospitalist   7. Right eyelid chalazion  -Defer evaluation to the hospitalist   Further recommendations per Dr. Ardis Hughs    LOS: 1 day   Noralyn Pick  12/28/2019,  10:00 AM   ________________________________________________________________________  [facility] MD note:  I personally examined the patient, reviewed the data and agree with the assessment and plan described above.  Unclear etiology of his e coli bactermia.  He has chronic abdominal pains that really don't seem at all worse than his usual. Liver transaminases were slightly elevated at admission.  I think MRI with MRCP is a good next step, at least to check for obvious retained CBD stones given his e coli bacteremia and transiently elevated LFTs. Continue IV abx for now. We will order the MR.   Owens Loffler, MD [facility] Pager [phone]

## 2019-12-28 NOTE — ED Notes (Signed)
Pt rested uneventfully during shift in hospital bed. Pt informed of delay for room on floor.

## 2019-12-28 NOTE — Progress Notes (Addendum)
PROGRESS NOTE    [name]  GYF:[id number]  DOB: 06-24-1975  PCP: [place], Betty G, MD Admit date:12/27/2019 Chief compliant: abdominal pain, positive blood cx Hospital course: 44 year old male with reported history of chronic pancreatitis, chronic abdominal pain on narcotics, history of cholecystectomy, appendectomy, nephrolithiasis, hypertension, previous h/o pancreatic duct stent placement/removal at [facility].  Patient had initially presented to ED on 7/30 with nausea vomiting and abdominal pain.  He had reported pain in the right upper quadrant and acute on chronic epigastric and left upper abdominal pain typical of his pancreatitis attacks.  Also reported fevers and chills. Patient had leukocytosis, mild acute kidney injury, mild transaminitis, normal lipase.  CT abdomen was fairly benign.  UA was normal, Covid negative, chest x-ray negative.  Blood cultures were drawn.Patient was discharged home. He was however called to return to ED due to blood cultures growing E. Coli. Patient admitted to [facility] for further evaluation and management.   Subjective:  Patient appears somewhat drowsy but able to participate in conversation.  States pain level 2/10 and points to left upper quadrant.  Objective: Vitals:   12/28/19 0400 12/28/19 0544 12/28/19 0741 12/28/19 0800  BP: (!) 133/83 (!) 133/83 (!) 135/94 (!) 132/89  Pulse: 78 71 72 72  Resp: 18 16 (!) 11 13  Temp:   99.3 F (37.4 C)   TempSrc:   Oral   SpO2: 95% 94% 95% 93%  Weight:      Height:        Intake/Output Summary (Last 24 hours) at 12/28/2019 0958 Last data filed at 12/27/2019 2140 Gross per 24 hour  Intake 200 ml  Output --  Net 200 ml   Filed Weights   12/26/19 2350  Weight: (!) 93.2 kg    Physical Examination:  General: Moderately built, somewhat drowsy, no acute distress noted Head ENT: Atraumatic normocephalic, PERRLA, neck supple. Right eye chalazion. Heart: S1-S2 heard, regular rate and rhythm, no murmurs.  No leg  edema noted Lungs: Equal air entry bilaterally, no rhonchi or rales on exam, no accessory muscle use Abdomen: Bowel sounds heard, soft, tenderness to deep palpation along left upper quadrant/left periumbilical area, nontender in epigastrium/right upper quadrant, nondistended. No organomegaly.  No CVA tenderness Extremities: No pedal edema.  No cyanosis or clubbing. Neurological: Awake alert oriented x3, no focal weakness or numbness, strength and sensations to crude touch intact Skin: No wounds or rashes.  Data Reviewed: I have personally reviewed following labs and imaging studies  CBC: Recent Labs  Lab 12/25/19 1353 12/27/19 0041 12/28/19 0502  WBC 16.9* 11.5* 10.7*  HGB 16.9 15.5 13.7  HCT 48.1 44.0 39.0  MCV 86.0 86.4 85.5  PLT 255 167 916*   Basic Metabolic Panel: Recent Labs  Lab 12/25/19 1353 12/27/19 0041 12/28/19 0502  NA 139 139 137  K 3.7 3.5 3.3*  CL 105 105 104  CO2 24 23 22   GLUCOSE 121* 99 106*  BUN 16 14 11   CREATININE 1.28* 1.04 0.84  CALCIUM 9.3 9.2 8.9  MG  --  2.3  --    GFR: Estimated Creatinine Clearance: 129.5 mL/min (by C-G formula based on SCr of 0.84 mg/dL). Liver Function Tests: Recent Labs  Lab 12/25/19 1353 12/27/19 0041 12/28/19 0502  AST 132* 59* 30  ALT 94* 92* 60*  ALKPHOS 108 83 70  BILITOT 0.7 0.5 0.4  PROT 7.5 7.5 6.5  ALBUMIN 4.2 4.3 3.5   Recent Labs  Lab 12/25/19 1353  LIPASE 36   No  results for input(s): AMMONIA in the last 168 hours. Coagulation Profile: No results for input(s): INR, PROTIME in the last 168 hours. Cardiac Enzymes: No results for input(s): CKTOTAL, CKMB, CKMBINDEX, TROPONINI in the last 168 hours. BNP (last 3 results) No results for input(s): PROBNP in the last 8760 hours. HbA1C: No results for input(s): HGBA1C in the last 72 hours. CBG: No results for input(s): GLUCAP in the last 168 hours. Lipid Profile: No results for input(s): CHOL, HDL, LDLCALC, TRIG, CHOLHDL, LDLDIRECT in the last 72  hours. Thyroid Function Tests: No results for input(s): TSH, T4TOTAL, FREET4, T3FREE, THYROIDAB in the last 72 hours. Anemia Panel: No results for input(s): VITAMINB12, FOLATE, FERRITIN, TIBC, IRON, RETICCTPCT in the last 72 hours. Sepsis Labs: No results for input(s): PROCALCITON, LATICACIDVEN in the last 168 hours.  Recent Results (from the past 240 hour(s))  SARS Coronavirus 2 by RT PCR (hospital order, performed in [facility] hospital lab) Nasopharyngeal Nasopharyngeal Swab     Status: None   Collection Time: 12/25/19  7:41 PM   Specimen: Nasopharyngeal Swab  Result Value Ref Range Status   SARS Coronavirus 2 NEGATIVE NEGATIVE Final    Comment: (NOTE) SARS-CoV-2 target nucleic acids are NOT DETECTED.  The SARS-CoV-2 RNA is generally detectable in upper and lower respiratory specimens during the acute phase of infection. The lowest concentration of SARS-CoV-2 viral copies this assay can detect is 250 copies / mL. A negative result does not preclude SARS-CoV-2 infection and should not be used as the sole basis for treatment or other patient management decisions.  A negative result may occur with improper specimen collection / handling, submission of specimen other than nasopharyngeal swab, presence of viral mutation(s) within the areas targeted by this assay, and inadequate number of viral copies (<250 copies / mL). A negative result must be combined with clinical observations, patient history, and epidemiological information.  Fact Sheet for Patients:   StrictlyIdeas.no  Fact Sheet for Healthcare Providers: BankingDealers.co.za  This test is not yet approved or  cleared by the [place] FDA and has been authorized for detection and/or diagnosis of SARS-CoV-2 by FDA under an Emergency Use Authorization (EUA).  This EUA will remain in effect (meaning this test can be used) for the duration of the COVID-19 declaration under  Section 564(b)(1) of the Act, 21 U.S.C. section 360bbb-3(b)(1), unless the authorization is terminated or revoked sooner.  Performed at [facility], [address] [place], [place] [address]   Culture, blood (routine x 2)     Status: None (Preliminary result)   Collection Time: 12/25/19 10:28 PM   Specimen: BLOOD  Result Value Ref Range Status   Specimen Description   Final    BLOOD LEFT ANTECUBITAL Performed at [facility], [address] [place], [place] [address]    Special Requests   Final    BOTTLES DRAWN AEROBIC AND ANAEROBIC Blood Culture adequate volume Performed at [facility], [address] [place], [place] [address]    Culture   Final    NO GROWTH < 24 HOURS Performed at [facility], [place] [address] [place], [place] [address]    Report Status PENDING  Incomplete  Urine culture     Status: None   Collection Time: 12/25/19 10:54 PM   Specimen: Urine, Random  Result Value Ref Range Status   Specimen Description   Final    URINE, RANDOM Performed at [facility], [address] [place], [place] [address]    Special Requests   Final    NONE Performed at [facility], [address] [place], [place] [address]    Culture   Final    NO GROWTH Performed at [facility], [place] [address], [place], [place] [address]    Report Status 12/27/2019 FINAL  Final  Culture, blood (routine x 2)     Status: Abnormal   Collection Time: 12/25/19 11:00 PM   Specimen: BLOOD  Result Value Ref Range Status   Specimen Description   Final    BLOOD RIGHT ANTECUBITAL Performed at [facility], [address] [place], [place] [address]    Special Requests   Final    BOTTLES DRAWN AEROBIC AND ANAEROBIC Blood Culture adequate volume Performed at [facility], [address] [place], [place] [address]    Culture  Setup Time   Final    GRAM NEGATIVE RODS AEROBIC BOTTLE ONLY CRITICAL  RESULT CALLED TO, READ BACK BY AND VERIFIED WITH: RN Chauncey Cruel [facility] AT 2119 12/26/19 BY L BENFIELD Performed at [facility], [place] [address] [place], [place] [address]    Culture ESCHERICHIA COLI (A)  Final   Report Status 12/28/2019 FINAL  Final   Organism ID, Bacteria ESCHERICHIA COLI  Final      Susceptibility   Escherichia coli - MIC*    AMPICILLIN >=32 RESISTANT Resistant     CEFAZOLIN 8 SENSITIVE Sensitive     CEFEPIME <=0.12 SENSITIVE Sensitive     CEFTAZIDIME <=1 SENSITIVE Sensitive     CEFTRIAXONE <=0.25 SENSITIVE Sensitive     CIPROFLOXACIN <=0.25 SENSITIVE Sensitive     GENTAMICIN <=1 SENSITIVE Sensitive     IMIPENEM <=0.25 SENSITIVE Sensitive     TRIMETH/SULFA <=20 SENSITIVE Sensitive     AMPICILLIN/SULBACTAM >=32 RESISTANT Resistant     PIP/TAZO <=4 SENSITIVE Sensitive     * ESCHERICHIA COLI  Blood Culture ID Panel (Reflexed)     Status: Abnormal   Collection Time: 12/25/19 11:00 PM  Result Value Ref Range Status   Enterococcus species NOT DETECTED NOT DETECTED Final   Listeria monocytogenes NOT DETECTED NOT DETECTED Final   Staphylococcus species NOT DETECTED NOT DETECTED Final   Staphylococcus aureus (BCID) NOT DETECTED NOT DETECTED Final   Streptococcus species NOT DETECTED NOT DETECTED Final   Streptococcus agalactiae NOT DETECTED NOT DETECTED Final   Streptococcus pneumoniae NOT DETECTED NOT DETECTED Final   Streptococcus pyogenes NOT DETECTED NOT DETECTED Final   Acinetobacter baumannii NOT DETECTED NOT DETECTED Final   Enterobacteriaceae species DETECTED (A) NOT DETECTED Final    Comment: CRITICAL RESULT CALLED TO, READ BACK BY AND VERIFIED WITH: RN S MAYNARD AT 2119 12/26/19 BY L BENFIELD Enterobacteriaceae represent a large family of gram-negative bacteria, not a single organism.    Enterobacter cloacae complex NOT DETECTED NOT DETECTED Final   Escherichia coli DETECTED (A) NOT DETECTED Final    Comment: CRITICAL RESULT CALLED TO, READ BACK BY AND VERIFIED  WITH: RN S MAYNARD AT 2119 12/26/19 BY L BENFIELD    Klebsiella oxytoca NOT DETECTED NOT DETECTED Final   Klebsiella pneumoniae NOT DETECTED NOT DETECTED Final   Proteus species NOT DETECTED NOT DETECTED Final   Serratia marcescens NOT DETECTED NOT DETECTED Final   Carbapenem resistance NOT DETECTED NOT DETECTED Final   Haemophilus influenzae NOT DETECTED NOT DETECTED Final   Neisseria meningitidis NOT DETECTED NOT DETECTED Final  Pseudomonas aeruginosa NOT DETECTED NOT DETECTED Final   Candida albicans NOT DETECTED NOT DETECTED Final   Candida glabrata NOT DETECTED NOT DETECTED Final   Candida krusei NOT DETECTED NOT DETECTED Final   Candida parapsilosis NOT DETECTED NOT DETECTED Final   Candida tropicalis NOT DETECTED NOT DETECTED Final    Comment: Performed at [facility], [place] [address], [place], [place] [address]  SARS Coronavirus 2 by RT PCR (hospital order, performed in [facility] hospital lab) Nasopharyngeal Nasopharyngeal Swab     Status: None   Collection Time: 12/27/19  4:11 AM   Specimen: Nasopharyngeal Swab  Result Value Ref Range Status   SARS Coronavirus 2 NEGATIVE NEGATIVE Final    Comment: (NOTE) SARS-CoV-2 target nucleic acids are NOT DETECTED.  The SARS-CoV-2 RNA is generally detectable in upper and lower respiratory specimens during the acute phase of infection. The lowest concentration of SARS-CoV-2 viral copies this assay can detect is 250 copies / mL. A negative result does not preclude SARS-CoV-2 infection and should not be used as the sole basis for treatment or other patient management decisions.  A negative result may occur with improper specimen collection / handling, submission of specimen other than nasopharyngeal swab, presence of viral mutation(s) within the areas targeted by this assay, and inadequate number of viral copies (<250 copies / mL). A negative result must be combined with clinical observations, patient history, and epidemiological  information.  Fact Sheet for Patients:   StrictlyIdeas.no  Fact Sheet for Healthcare Providers: BankingDealers.co.za  This test is not yet approved or  cleared by the [place] FDA and has been authorized for detection and/or diagnosis of SARS-CoV-2 by FDA under an Emergency Use Authorization (EUA).  This EUA will remain in effect (meaning this test can be used) for the duration of the COVID-19 declaration under Section 564(b)(1) of the Act, 21 U.S.C. section 360bbb-3(b)(1), unless the authorization is terminated or revoked sooner.  Performed at [facility], [place] [address] [place], [place] [address]   Urine Culture     Status: None   Collection Time: 12/27/19  8:34 AM   Specimen: Urine, Random  Result Value Ref Range Status   Specimen Description   Final    URINE, RANDOM Performed at [facility] [address], [place], [place] [address]    Special Requests   Final    URINE, RANDOM Performed at [facility] [address] [place], [place] [address]    Culture   Final    NO GROWTH Performed at [facility], [place] [address], [place], [place] [address]    Report Status 12/28/2019 FINAL  Final      Radiology Studies: No results found.    Scheduled Meds: . enoxaparin (LOVENOX) injection  40 mg Subcutaneous Q24H  . metoprolol succinate  100 mg Oral Daily  . nicotine  14 mg Transdermal Daily  . oxyCODONE  10 mg Oral Q12H   Continuous Infusions: . cefTRIAXone (ROCEPHIN)  IV Stopped (12/27/19 2028)     Assessment/Plan:   1.  E. coli/Enterobacter bacteremia: Patient admitted with IV cefepime.  Etiology unclear, given elevated LFTs, bacteremia from possibly transient choledocholithiasis suspected although patient does not seem to have right upper quadrant symptoms and points to splenic area.Marland Kitchen  He is also status post cholecystectomy and CT reported  pneumobilia, chronically absent gallbladder.  No evidence of pancreatitis or pyelonephritis on CT.  UA unremarkable.  Repeat blood cultures ordered.  GI  consulted and they have ordered MRI abdomen.Not sure if colonic translocation would be a consideration, may need colonoscopy.  Defer to GI.  Will follow up MRI results.  Currently on OxyContin 10 mg every 12 as well as Dilaudid as needed.  Patient requested pre-MRI sedation, given drowsy appearance, low-dose Valium p.o. ordered.  Monitor closely on IV ceftriaxone that he is receiving currently, follow white count (16k on admission) and LFTs.    2.  Chronic abdominal pain: Patient has had several lipase levels done over the years.  In this admission lipase within normal limits, nontoxic appearance, transaminases mildly elevated compared to prior.  Given concern for somnolence/drowsiness, monitor closely for signs of oversedation.  DC IV Dilaudid and resume home regimen of scheduled OxyContin and as needed medications.  3.  Dyspepsia: Patient reports nausea associated with problem #2.  Denies any recent vomiting.  Compazine as needed ordered-we will change to Zofran given concern for sedation and prolonged QTC.  Current presentation of left upper quadrant pain and nausea could be related to PUD.  Will defer need for EGD to GI.  4.  Hypertension: Resume metoprolol.  5.  Prolonged QT interval: From 7/30, repeat EKG yesterday showed improvement to 437 ms.   DVT prophylaxis: Lovenox Code Status: Full code Family / Patient Communication: Discussed with patient, family not present at bedside Disposition Plan:   Status is: Inpatient  Remains inpatient appropriate because:Ongoing diagnostic testing needed not appropriate for outpatient work up   Dispo: The patient is from: Home              Anticipated d/c is to: Home              Anticipated d/c date is: 2 days              Patient currently is not medically stable to d/c.            Time  spent: 35 minutes     >50% time spent in discussions with care team and coordination of care.    Guilford Shi, MD [facility] Pager in [place]  If 7PM-7AM, please contact night-coverage [email or web address] 12/28/2019, 9:58 AM

## 2019-12-29 LAB — CBC
HCT: 38.5 % — ABNORMAL LOW (ref 39.0–52.0)
Hemoglobin: 13.7 g/dL (ref 13.0–17.0)
MCH: 30.2 pg (ref 26.0–34.0)
MCHC: 35.6 g/dL (ref 30.0–36.0)
MCV: 85 fL (ref 80.0–100.0)
Platelets: 153 10*3/uL (ref 150–400)
RBC: 4.53 MIL/uL (ref 4.22–5.81)
RDW: 12.6 % (ref 11.5–15.5)
WBC: 7.6 10*3/uL (ref 4.0–10.5)
nRBC: 0 % (ref 0.0–0.2)

## 2019-12-29 LAB — ROCKY MTN SPOTTED FVR ABS PNL(IGG+IGM)
RMSF IgG: NEGATIVE
RMSF IgM: 0.4 index (ref 0.00–0.89)

## 2019-12-29 LAB — HEPATIC FUNCTION PANEL
ALT: 47 U/L — ABNORMAL HIGH (ref 0–44)
AST: 22 U/L (ref 15–41)
Albumin: 3.3 g/dL — ABNORMAL LOW (ref 3.5–5.0)
Alkaline Phosphatase: 77 U/L (ref 38–126)
Bilirubin, Direct: 0.1 mg/dL (ref 0.0–0.2)
Indirect Bilirubin: 0.3 mg/dL (ref 0.3–0.9)
Total Bilirubin: 0.4 mg/dL (ref 0.3–1.2)
Total Protein: 6 g/dL — ABNORMAL LOW (ref 6.5–8.1)

## 2019-12-29 MED ORDER — METRONIDAZOLE 500 MG PO TABS
500.0000 mg | ORAL_TABLET | Freq: Three times a day (TID) | ORAL | Status: DC
  Administered 2019-12-29 – 2019-12-30 (×4): 500 mg via ORAL
  Filled 2019-12-29 (×4): qty 1

## 2019-12-29 MED ORDER — POTASSIUM CHLORIDE 20 MEQ PO PACK
40.0000 meq | PACK | Freq: Once | ORAL | Status: AC
  Administered 2019-12-29: 40 meq via ORAL
  Filled 2019-12-29: qty 2

## 2019-12-29 NOTE — Plan of Care (Signed)
  Problem: Education: Goal: Knowledge of General Education information will improve Description: Including pain rating scale, medication(s)/side effects and non-pharmacologic comfort measures Outcome: Completed/Met   Problem: Health Behavior/Discharge Planning: Goal: Ability to manage health-related needs will improve Outcome: Completed/Met   Problem: Clinical Measurements: Goal: Ability to maintain clinical measurements within normal limits will improve Outcome: Completed/Met Goal: Respiratory complications will improve Outcome: Completed/Met Goal: Cardiovascular complication will be avoided Outcome: Completed/Met   Problem: Activity: Goal: Risk for activity intolerance will decrease Outcome: Completed/Met

## 2019-12-29 NOTE — Progress Notes (Addendum)
PROGRESS NOTE    [name]  UEA:[id number]  DOB: 04-Aug-1975  PCP: Martin[place], Betty G, MD Admit date:12/27/2019 Chief compliant: abdominal pain, positive blood cx Hospital course: 44 year old male with reported history of chronic pancreatitis, chronic abdominal pain on narcotics, history of cholecystectomy, appendectomy, nephrolithiasis, hypertension, previous h/o pancreatic duct stent placement/removal at [facility].  Patient had initially presented to ED on 7/30 with nausea vomiting and abdominal pain.  He had reported pain in the right upper quadrant and acute on chronic epigastric and left upper abdominal pain typical of his pancreatitis attacks.  Also reported fevers and chills. Patient had leukocytosis, mild acute kidney injury, mild transaminitis, normal lipase.  CT abdomen was fairly benign.  UA was normal, Covid negative, chest x-ray negative.  Blood cultures were drawn.Patient was discharged home. He was however called to return to ED due to blood cultures growing E. Coli. Patient admitted to [facility] for further evaluation and management.   Subjective:  Patient appears much more awake, alert and communicative today.  States pain is well controlled with current opiate regimen.  Tolerating liquid diet well. Objective: Vitals:   12/28/19 1626 12/28/19 2045 12/29/19 0003 12/29/19 0453  BP: 128/88 134/76 120/88 136/77  Pulse: 68 70 61 (!) 56  Resp: 18 12 18 18   Temp: 98.1 F (36.7 C) 98.4 F (36.9 C) 98 F (36.7 C) 97.9 F (36.6 C)  TempSrc: Oral Oral Oral Oral  SpO2: 96% 97% 95% 95%  Weight:      Height:        Intake/Output Summary (Last 24 hours) at 12/29/2019 1315 Last data filed at 12/29/2019 1101 Gross per 24 hour  Intake 820 ml  Output --  Net 820 ml   Filed Weights   12/26/19 2350  Weight: (!) 93.2 kg    Physical Examination:  General: Moderately built, somewhat drowsy, no acute distress noted Head ENT: Atraumatic normocephalic, PERRLA, neck supple. Right eye  chalazion. Heart: S1-S2 heard, regular rate and rhythm, no murmurs.  No leg edema noted Lungs: Equal air entry bilaterally, no rhonchi or rales on exam, no accessory muscle use Abdomen: Bowel sounds heard, soft, tenderness to deep palpation along left upper quadrant/left periumbilical area, nontender in epigastrium/right upper quadrant, nondistended. No organomegaly.  No CVA tenderness Extremities: No pedal edema.  No cyanosis or clubbing. Neurological: Awake alert oriented x3, no focal weakness or numbness, strength and sensations to crude touch intact Skin: No wounds or rashes.  Data Reviewed: I have personally reviewed following labs and imaging studies  CBC: Recent Labs  Lab 12/25/19 1353 12/27/19 0041 12/28/19 0502 12/29/19 0548  WBC 16.9* 11.5* 10.7* 7.6  HGB 16.9 15.5 13.7 13.7  HCT 48.1 44.0 39.0 38.5*  MCV 86.0 86.4 85.5 85.0  PLT 255 167 137* 478   Basic Metabolic Panel: Recent Labs  Lab 12/25/19 1353 12/27/19 0041 12/28/19 0502  NA 139 139 137  K 3.7 3.5 3.3*  CL 105 105 104  CO2 24 23 22   GLUCOSE 121* 99 106*  BUN 16 14 11   CREATININE 1.28* 1.04 0.84  CALCIUM 9.3 9.2 8.9  MG  --  2.3  --    GFR: Estimated Creatinine Clearance: 129.5 mL/min (by C-G formula based on SCr of 0.84 mg/dL). Liver Function Tests: Recent Labs  Lab 12/25/19 1353 12/27/19 0041 12/28/19 0502 12/29/19 0945  AST 132* 59* 30 22  ALT 94* 92* 60* 47*  ALKPHOS 108 83 70 77  BILITOT 0.7 0.5 0.4 0.4  PROT 7.5 7.5  6.5 6.0*  ALBUMIN 4.2 4.3 3.5 3.3*   Recent Labs  Lab 12/25/19 1353  LIPASE 36   No results for input(s): AMMONIA in the last 168 hours. Coagulation Profile: No results for input(s): INR, PROTIME in the last 168 hours. Cardiac Enzymes: No results for input(s): CKTOTAL, CKMB, CKMBINDEX, TROPONINI in the last 168 hours. BNP (last 3 results) No results for input(s): PROBNP in the last 8760 hours. HbA1C: No results for input(s): HGBA1C in the last 72 hours. CBG: No  results for input(s): GLUCAP in the last 168 hours. Lipid Profile: No results for input(s): CHOL, HDL, LDLCALC, TRIG, CHOLHDL, LDLDIRECT in the last 72 hours. Thyroid Function Tests: No results for input(s): TSH, T4TOTAL, FREET4, T3FREE, THYROIDAB in the last 72 hours. Anemia Panel: No results for input(s): VITAMINB12, FOLATE, FERRITIN, TIBC, IRON, RETICCTPCT in the last 72 hours. Sepsis Labs: No results for input(s): PROCALCITON, LATICACIDVEN in the last 168 hours.  Recent Results (from the past 240 hour(s))  SARS Coronavirus 2 by RT PCR (hospital order, performed in [facility] hospital lab) Nasopharyngeal Nasopharyngeal Swab     Status: None   Collection Time: 12/25/19  7:41 PM   Specimen: Nasopharyngeal Swab  Result Value Ref Range Status   SARS Coronavirus 2 NEGATIVE NEGATIVE Final    Comment: (NOTE) SARS-CoV-2 target nucleic acids are NOT DETECTED.  The SARS-CoV-2 RNA is generally detectable in upper and lower respiratory specimens during the acute phase of infection. The lowest concentration of SARS-CoV-2 viral copies this assay can detect is 250 copies / mL. A negative result does not preclude SARS-CoV-2 infection and should not be used as the sole basis for treatment or other patient management decisions.  A negative result may occur with improper specimen collection / handling, submission of specimen other than nasopharyngeal swab, presence of viral mutation(s) within the areas targeted by this assay, and inadequate number of viral copies (<250 copies / mL). A negative result must be combined with clinical observations, patient history, and epidemiological information.  Fact Sheet for Patients:   StrictlyIdeas.no  Fact Sheet for Healthcare Providers: BankingDealers.co.za  This test is not yet approved or  cleared by the [place] FDA and has been authorized for detection and/or diagnosis of SARS-CoV-2 by FDA under an  Emergency Use Authorization (EUA).  This EUA will remain in effect (meaning this test can be used) for the duration of the COVID-19 declaration under Section 564(b)(1) of the Act, 21 U.S.C. section 360bbb-3(b)(1), unless the authorization is terminated or revoked sooner.  Performed at [facility], [address] [place], [place] [address]   Culture, blood (routine x 2)     Status: None (Preliminary result)   Collection Time: 12/25/19 10:28 PM   Specimen: BLOOD  Result Value Ref Range Status   Specimen Description   Final    BLOOD LEFT ANTECUBITAL Performed at [facility], [address] [place], [place] [address]    Special Requests   Final    BOTTLES DRAWN AEROBIC AND ANAEROBIC Blood Culture adequate volume Performed at [facility], [address] [place], [place] [address]    Culture   Final    NO GROWTH 2 DAYS Performed at [facility], [place] [address] [place], [place] [address]    Report Status PENDING  Incomplete  Urine culture     Status: None   Collection Time: 12/25/19 10:54 PM   Specimen: Urine, Random  Result Value Ref Range  Status   Specimen Description   Final    URINE, RANDOM Performed at [facility], [address] [place], [place] [address]    Special Requests   Final    NONE Performed at [facility], [address] [place], [place] [address]    Culture   Final    NO GROWTH Performed at [facility], [place] [address], [place], [place] [address]    Report Status 12/27/2019 FINAL  Final  Culture, blood (routine x 2)     Status: Abnormal   Collection Time: 12/25/19 11:00 PM   Specimen: BLOOD  Result Value Ref Range Status   Specimen Description   Final    BLOOD RIGHT ANTECUBITAL Performed at [facility], [address] [place], [place] [address]    Special Requests   Final    BOTTLES DRAWN AEROBIC AND ANAEROBIC Blood Culture adequate volume Performed at [facility], [address] [place], [place] [address]    Culture  Setup Time   Final    GRAM NEGATIVE RODS AEROBIC BOTTLE ONLY CRITICAL RESULT CALLED TO, READ BACK BY AND VERIFIED WITH: RN Chauncey Cruel [facility] AT 2119 12/26/19 BY L BENFIELD Performed at [facility], [place] [address], [place], [place] [address]    Culture ESCHERICHIA COLI (A)  Final   Report Status 12/28/2019 FINAL  Final   Organism ID, Bacteria ESCHERICHIA COLI  Final      Susceptibility   Escherichia coli - MIC*    AMPICILLIN >=32 RESISTANT Resistant     CEFAZOLIN 8 SENSITIVE Sensitive     CEFEPIME <=0.12 SENSITIVE Sensitive     CEFTAZIDIME <=1 SENSITIVE Sensitive     CEFTRIAXONE <=0.25 SENSITIVE Sensitive     CIPROFLOXACIN <=0.25 SENSITIVE Sensitive     GENTAMICIN <=1 SENSITIVE Sensitive     IMIPENEM <=0.25 SENSITIVE Sensitive     TRIMETH/SULFA <=20 SENSITIVE Sensitive     AMPICILLIN/SULBACTAM >=32 RESISTANT Resistant     PIP/TAZO <=4 SENSITIVE Sensitive     * ESCHERICHIA COLI  Blood Culture ID Panel (Reflexed)     Status: Abnormal   Collection Time: 12/25/19 11:00 PM  Result Value Ref Range Status   Enterococcus species NOT DETECTED NOT DETECTED Final   Listeria monocytogenes NOT DETECTED NOT DETECTED Final   Staphylococcus species NOT DETECTED NOT DETECTED Final   Staphylococcus aureus (BCID) NOT DETECTED NOT DETECTED Final   Streptococcus species NOT DETECTED NOT DETECTED Final   Streptococcus agalactiae NOT DETECTED NOT DETECTED Final   Streptococcus pneumoniae NOT DETECTED NOT DETECTED Final   Streptococcus pyogenes NOT DETECTED NOT DETECTED Final   Acinetobacter baumannii NOT DETECTED NOT DETECTED Final   Enterobacteriaceae species DETECTED (A) NOT DETECTED Final    Comment: CRITICAL RESULT CALLED TO, READ BACK BY AND VERIFIED WITH: RN S MAYNARD AT 2119 12/26/19 BY L BENFIELD Enterobacteriaceae represent a large family of gram-negative bacteria, not a single organism.    Enterobacter cloacae complex NOT  DETECTED NOT DETECTED Final   Escherichia coli DETECTED (A) NOT DETECTED Final    Comment: CRITICAL RESULT CALLED TO, READ BACK BY AND VERIFIED WITH: RN S MAYNARD AT 2119 12/26/19 BY L BENFIELD    Klebsiella oxytoca NOT DETECTED NOT DETECTED Final   Klebsiella pneumoniae NOT DETECTED NOT DETECTED Final   Proteus species NOT DETECTED NOT DETECTED Final   Serratia marcescens NOT DETECTED NOT DETECTED Final   Carbapenem resistance NOT DETECTED NOT  DETECTED Final   Haemophilus influenzae NOT DETECTED NOT DETECTED Final   Neisseria meningitidis NOT DETECTED NOT DETECTED Final   Pseudomonas aeruginosa NOT DETECTED NOT DETECTED Final   Candida albicans NOT DETECTED NOT DETECTED Final   Candida glabrata NOT DETECTED NOT DETECTED Final   Candida krusei NOT DETECTED NOT DETECTED Final   Candida parapsilosis NOT DETECTED NOT DETECTED Final   Candida tropicalis NOT DETECTED NOT DETECTED Final    Comment: Performed at [facility], [place] [address] [place], [place] [address]  SARS Coronavirus 2 by RT PCR (hospital order, performed in [facility] hospital lab) Nasopharyngeal Nasopharyngeal Swab     Status: None   Collection Time: 12/27/19  4:11 AM   Specimen: Nasopharyngeal Swab  Result Value Ref Range Status   SARS Coronavirus 2 NEGATIVE NEGATIVE Final    Comment: (NOTE) SARS-CoV-2 target nucleic acids are NOT DETECTED.  The SARS-CoV-2 RNA is generally detectable in upper and lower respiratory specimens during the acute phase of infection. The lowest concentration of SARS-CoV-2 viral copies this assay can detect is 250 copies / mL. A negative result does not preclude SARS-CoV-2 infection and should not be used as the sole basis for treatment or other patient management decisions.  A negative result may occur with improper specimen collection / handling, submission of specimen other than nasopharyngeal swab, presence of viral mutation(s) within the areas targeted by this assay, and  inadequate number of viral copies (<250 copies / mL). A negative result must be combined with clinical observations, patient history, and epidemiological information.  Fact Sheet for Patients:   StrictlyIdeas.no  Fact Sheet for Healthcare Providers: BankingDealers.co.za  This test is not yet approved or  cleared by the [place] FDA and has been authorized for detection and/or diagnosis of SARS-CoV-2 by FDA under an Emergency Use Authorization (EUA).  This EUA will remain in effect (meaning this test can be used) for the duration of the COVID-19 declaration under Section 564(b)(1) of the Act, 21 U.S.C. section 360bbb-3(b)(1), unless the authorization is terminated or revoked sooner.  Performed at [facility], [place] [address], [place], [place] [address]   Urine Culture     Status: None   Collection Time: 12/27/19  8:34 AM   Specimen: Urine, Random  Result Value Ref Range Status   Specimen Description   Final    URINE, RANDOM Performed at [facility] [address] [place], [place] [address]    Special Requests   Final    URINE, RANDOM Performed at [facility] [address] [place], [place] [address]    Culture   Final    NO GROWTH Performed at [facility], [place] [address] [place], [place] [address]    Report Status 12/28/2019 FINAL  Final  Culture, blood (Routine X 2) w Reflex to ID Panel     Status: None (Preliminary result)   Collection Time: 12/27/19 10:57 AM   Specimen: BLOOD  Result Value Ref Range Status   Specimen Description   Final    BLOOD LEFT ANTECUBITAL Performed at [facility] [address] [place], [place] [address]    Special Requests   Final    BOTTLES DRAWN AEROBIC ONLY Blood Culture results may not be optimal due to an excessive volume of blood received in culture bottles Performed at [facility] [address], [place], [place] [address]    Culture   Final  NO GROWTH < 24 HOURS Performed at [facility] [address], [place], [place] [address]    Report Status PENDING  Incomplete  Culture, blood (Routine X 2) w Reflex to ID Panel     Status: None (Preliminary result)   Collection Time: 12/27/19 11:02 AM   Specimen: BLOOD  Result Value Ref Range Status   Specimen Description   Final    BLOOD RIGHT ANTECUBITAL Performed at [facility] [address] [place], [place] [address]    Special Requests   Final    BOTTLES DRAWN AEROBIC ONLY Blood Culture adequate volume Performed at [facility] [address], [place], [place] [address]    Culture   Final    NO GROWTH < 24 HOURS Performed at [facility] [address], [place],  [address]    Report Status PENDING  Incomplete      Radiology Studies: MR 3D Recon At Scanner  Result Date: 12/28/2019 CLINICAL DATA:  Chronic abdominal pain, possible chronic pancreatitis. Elevated LFTs and bacteremia. EXAM: MRI ABDOMEN WITHOUT AND WITH CONTRAST (INCLUDING MRCP) TECHNIQUE: Multiplanar multisequence MR imaging of the abdomen was performed both before and after the administration of intravenous contrast. Heavily T2-weighted images of the biliary and pancreatic ducts were obtained, and three-dimensional MRCP images were rendered by post processing. CONTRAST:  51mL GADAVIST GADOBUTROL 1 MMOL/ML IV SOLN COMPARISON:  CT abdomen pelvis 12/25/2019 FINDINGS: Lower chest: No acute findings in the lower chest. Cardiac size within normal limits. No large pericardial effusion. Hepatobiliary: Liver size within expected normals. Smooth surface contour. Normal intrinsic signal without significant dropout on in or out of phase imaging. Tiny 7 mm T1 hypointense focus with some nodular peripheral enhancement and centripetal filling most suggestive of a tiny hemangioma. No focal  concerning liver lesions are seen. Patient is post cholecystectomy. There is intra and extrahepatic biliary ductal dilatation with the common bile duct measuring up to 11 mm in maximal diameter. Shifting serpentine filling defects within the anti dependent portions of the liver as well as within the common bile duct on both thick slice T2 imaging and source images are most likely to reflect artifact related to pneumobilia seen on multiple prior comparison studies. Normal distal tapering is seen on MIP reconstructions and source images. Pancreas: No mass, inflammatory changes, or other parenchymal abnormality identified. Spleen:  Within normal limits in size and appearance. Adrenals/Urinary Tract: No worrisome adrenal lesions. Minimal scarring in the upper pole right kidney. Few T2 hyperintense simple appearing subcentimeter cystic foci in both kidneys. No concerning focal masses identified. No evidence of hydronephrosis. Stomach/Bowel: Visualized portions within the abdomen are unremarkable. Vascular/Lymphatic: No pathologically enlarged lymph nodes identified. No abdominal aortic aneurysm demonstrated. Other:  None. Musculoskeletal: No suspicious bone lesions identified. Visible cord signal is unremarkable. IMPRESSION: 1. Status post cholecystectomy. There is intra and extrahepatic biliary ductal dilatation with the common bile duct measuring up to 11 mm in maximal diameter which is likely within expected normal for post cholecystectomy reservoir effect. 2. Shifting serpentine filling defect predominantly within the common bile duct but also seen in the anti dependent portions of the liver are favored to reflect artifact related to pneumobilia rather than true filling defects. No obstructing gallstones are evident. Normal distal tapering is seen on MIP reconstructions and source images. 3. No acute pancreatic inflammation. 4. Tiny 7 mm focus in the periphery of segment VIII most compatible with tiny hepatic  hemangioma. 5. No acute findings are  identified in the abdomen. Electronically Signed   By: Lovena Le M.D.   On: 12/28/2019 19:12   MR ABDOMEN MRCP W WO CONTAST  Result Date: 12/28/2019 CLINICAL DATA:  Chronic abdominal pain, possible chronic pancreatitis. Elevated LFTs and bacteremia. EXAM: MRI ABDOMEN WITHOUT AND WITH CONTRAST (INCLUDING MRCP) TECHNIQUE: Multiplanar multisequence MR imaging of the abdomen was performed both before and after the administration of intravenous contrast. Heavily T2-weighted images of the biliary and pancreatic ducts were obtained, and three-dimensional MRCP images were rendered by post processing. CONTRAST:  58mL GADAVIST GADOBUTROL 1 MMOL/ML IV SOLN COMPARISON:  CT abdomen pelvis 12/25/2019 FINDINGS: Lower chest: No acute findings in the lower chest. Cardiac size within normal limits. No large pericardial effusion. Hepatobiliary: Liver size within expected normals. Smooth surface contour. Normal intrinsic signal without significant dropout on in or out of phase imaging. Tiny 7 mm T1 hypointense focus with some nodular peripheral enhancement and centripetal filling most suggestive of a tiny hemangioma. No focal concerning liver lesions are seen. Patient is post cholecystectomy. There is intra and extrahepatic biliary ductal dilatation with the common bile duct measuring up to 11 mm in maximal diameter. Shifting serpentine filling defects within the anti dependent portions of the liver as well as within the common bile duct on both thick slice T2 imaging and source images are most likely to reflect artifact related to pneumobilia seen on multiple prior comparison studies. Normal distal tapering is seen on MIP reconstructions and source images. Pancreas: No mass, inflammatory changes, or other parenchymal abnormality identified. Spleen:  Within normal limits in size and appearance. Adrenals/Urinary Tract: No worrisome adrenal lesions. Minimal scarring in the upper pole right  kidney. Few T2 hyperintense simple appearing subcentimeter cystic foci in both kidneys. No concerning focal masses identified. No evidence of hydronephrosis. Stomach/Bowel: Visualized portions within the abdomen are unremarkable. Vascular/Lymphatic: No pathologically enlarged lymph nodes identified. No abdominal aortic aneurysm demonstrated. Other:  None. Musculoskeletal: No suspicious bone lesions identified. Visible cord signal is unremarkable. IMPRESSION: 1. Status post cholecystectomy. There is intra and extrahepatic biliary ductal dilatation with the common bile duct measuring up to 11 mm in maximal diameter which is likely within expected normal for post cholecystectomy reservoir effect. 2. Shifting serpentine filling defect predominantly within the common bile duct but also seen in the anti dependent portions of the liver are favored to reflect artifact related to pneumobilia rather than true filling defects. No obstructing gallstones are evident. Normal distal tapering is seen on MIP reconstructions and source images. 3. No acute pancreatic inflammation. 4. Tiny 7 mm focus in the periphery of segment VIII most compatible with tiny hepatic hemangioma. 5. No acute findings are identified in the abdomen. Electronically Signed   By: Lovena Le M.D.   On: 12/28/2019 19:12      Scheduled Meds: . enoxaparin (LOVENOX) injection  40 mg Subcutaneous Q24H  . metoprolol succinate  100 mg Oral Daily  . nicotine  14 mg Transdermal Daily  . oxyCODONE  10 mg Oral Q12H   Continuous Infusions: . cefTRIAXone (ROCEPHIN)  IV 2 g (12/28/19 2007)     Assessment/Plan:   1.  E. coli/Enterobacter bacteremia: Etiology unclear, given elevated LFTs, bacteremia from biliary source.  He is also status post cholecystectomy and CT reported pneumobilia, chronically absent gallbladder.  No evidence of pancreatitis or pyelonephritis on CT. MRI ABDOMEN did reveal cholestasis/pneumobilia.  Discussed with GI who plan on ERCP  tomorrow for possible?  CBD obstruction/stricture causing cholestasis/cholangitis.  UA unremarkable and urine  cultures from 7/31 and 8/1 so far negative.  Repeat blood cultures from this admission so far negative. On IV ceftriaxone, added Flagyl, leukocytosis appears to have resolved (16.9 k on initial presentation->11 K on this admission), improved LFTs-?  Passed CBD stone.  2.  Chronic abdominal pain: Patient has had several lipase levels done over the years.  In this admission patient reported mostly left upper quadrant pain, lipase within normal limits, nontoxic appearance, transaminases mildly elevated compared to prior-now improving.  Continue home dose of OxyContin 10 mg twice daily and oxycodone 5 mg as needed for breakthrough pain.  3.  Dyspepsia: Patient reports nausea associated with problem #2.  Denies any recent vomiting.  Compazine as needed ordered-changed to Zofran to avoid oversedation.  Current presentation of left upper quadrant pain and nausea could be related to PUD.  Follow-up ERCP report for any gastric findings  4.  Hypertension: Resume metoprolol.  5.  Prolonged QT interval: From 7/30, repeat EKG yesterday showed improvement to 437 ms.  6.  Tobacco use: Counseled to quit.  Nicotine patch ordered.  7.  Mild hypokalemia: Replace.   DVT prophylaxis: Lovenox Code Status: Full code Family / Patient Communication: Discussed with patient, family not present at bedside Disposition Plan:   Status is: Inpatient  Remains inpatient appropriate because:Ongoing diagnostic testing needed not appropriate for outpatient work up   Dispo: The patient is from: Home              Anticipated d/c is to: Home              Anticipated d/c date is: 1- 2 days              Patient currently is not medically stable to d/c.            Time spent: 25 minutes     >50% time spent in discussions with care team and coordination of care.    [place] Shi, MD [facility] Pager in [place]  If 7PM-7AM, please contact night-coverage [email or web address] 12/29/2019, 1:15 PM

## 2019-12-29 NOTE — Progress Notes (Addendum)
Progress Note   Subjective  Chief Complaint: Elevated LFTs, E. coli bacteremia  This morning the patient tells me that he continues to feel better.  He tolerated a regular diet today.  He has had bowel movements.  Denies any nausea or vomiting.   Objective   Vital signs in last 24 hours: Temp:  [97.9 F (36.6 C)-99 F (37.2 C)] 97.9 F (36.6 C) (08/03 0453) Pulse Rate:  [56-92] 56 (08/03 0453) Resp:  [11-21] 18 (08/03 0453) BP: (120-143)/(76-92) 136/77 (08/03 0453) SpO2:  [93 %-97 %] 95 % (08/03 0453) Last BM Date: 12/28/19 General:    white male in NAD Heart:  Regular rate and rhythm; no murmurs Lungs: Respirations even and unlabored, lungs CTA bilaterally Abdomen:  Soft, nontender and nondistended. Normal bowel sounds. Extremities:  Without edema. Neurologic:  Alert and oriented,  grossly normal neurologically. Psych:  Cooperative. Normal mood and affect.  Intake/Output from previous day: 08/02 0701 - 08/03 0700 In: 580 [P.O.:480; IV Piggyback:100] Out: -   Lab Results: Recent Labs    12/27/19 0041 12/28/19 0502 12/29/19 0548  WBC 11.5* 10.7* 7.6  HGB 15.5 13.7 13.7  HCT 44.0 39.0 38.5*  PLT 167 137* 153   BMET Recent Labs    12/27/19 0041 12/28/19 0502  NA 139 137  K 3.5 3.3*  CL 105 104  CO2 23 22  GLUCOSE 99 106*  BUN 14 11  CREATININE 1.04 0.84  CALCIUM 9.2 8.9   LFT Recent Labs    12/28/19 0502  PROT 6.5  ALBUMIN 3.5  AST 30  ALT 60*  ALKPHOS 70  BILITOT 0.4   Studies/Results: MR 3D Recon At Scanner  Result Date: 12/28/2019 CLINICAL DATA:  Chronic abdominal pain, possible chronic pancreatitis. Elevated LFTs and bacteremia. EXAM: MRI ABDOMEN WITHOUT AND WITH CONTRAST (INCLUDING MRCP) TECHNIQUE: Multiplanar multisequence MR imaging of the abdomen was performed both before and after the administration of intravenous contrast. Heavily T2-weighted images of the biliary and pancreatic ducts were obtained, and three-dimensional MRCP images  were rendered by post processing. CONTRAST:  73mL GADAVIST GADOBUTROL 1 MMOL/ML IV SOLN COMPARISON:  CT abdomen pelvis 12/25/2019 FINDINGS: Lower chest: No acute findings in the lower chest. Cardiac size within normal limits. No large pericardial effusion. Hepatobiliary: Liver size within expected normals. Smooth surface contour. Normal intrinsic signal without significant dropout on in or out of phase imaging. Tiny 7 mm T1 hypointense focus with some nodular peripheral enhancement and centripetal filling most suggestive of a tiny hemangioma. No focal concerning liver lesions are seen. Patient is post cholecystectomy. There is intra and extrahepatic biliary ductal dilatation with the common bile duct measuring up to 11 mm in maximal diameter. Shifting serpentine filling defects within the anti dependent portions of the liver as well as within the common bile duct on both thick slice T2 imaging and source images are most likely to reflect artifact related to pneumobilia seen on multiple prior comparison studies. Normal distal tapering is seen on MIP reconstructions and source images. Pancreas: No mass, inflammatory changes, or other parenchymal abnormality identified. Spleen:  Within normal limits in size and appearance. Adrenals/Urinary Tract: No worrisome adrenal lesions. Minimal scarring in the upper pole right kidney. Few T2 hyperintense simple appearing subcentimeter cystic foci in both kidneys. No concerning focal masses identified. No evidence of hydronephrosis. Stomach/Bowel: Visualized portions within the abdomen are unremarkable. Vascular/Lymphatic: No pathologically enlarged lymph nodes identified. No abdominal aortic aneurysm demonstrated. Other:  None. Musculoskeletal: No suspicious bone lesions identified.  Visible cord signal is unremarkable. IMPRESSION: 1. Status post cholecystectomy. There is intra and extrahepatic biliary ductal dilatation with the common bile duct measuring up to 11 mm in maximal  diameter which is likely within expected normal for post cholecystectomy reservoir effect. 2. Shifting serpentine filling defect predominantly within the common bile duct but also seen in the anti dependent portions of the liver are favored to reflect artifact related to pneumobilia rather than true filling defects. No obstructing gallstones are evident. Normal distal tapering is seen on MIP reconstructions and source images. 3. No acute pancreatic inflammation. 4. Tiny 7 mm focus in the periphery of segment VIII most compatible with tiny hepatic hemangioma. 5. No acute findings are identified in the abdomen. Electronically Signed   By: Lovena Le M.D.   On: 12/28/2019 19:12   MR ABDOMEN MRCP W WO CONTAST  Result Date: 12/28/2019 CLINICAL DATA:  Chronic abdominal pain, possible chronic pancreatitis. Elevated LFTs and bacteremia. EXAM: MRI ABDOMEN WITHOUT AND WITH CONTRAST (INCLUDING MRCP) TECHNIQUE: Multiplanar multisequence MR imaging of the abdomen was performed both before and after the administration of intravenous contrast. Heavily T2-weighted images of the biliary and pancreatic ducts were obtained, and three-dimensional MRCP images were rendered by post processing. CONTRAST:  39mL GADAVIST GADOBUTROL 1 MMOL/ML IV SOLN COMPARISON:  CT abdomen pelvis 12/25/2019 FINDINGS: Lower chest: No acute findings in the lower chest. Cardiac size within normal limits. No large pericardial effusion. Hepatobiliary: Liver size within expected normals. Smooth surface contour. Normal intrinsic signal without significant dropout on in or out of phase imaging. Tiny 7 mm T1 hypointense focus with some nodular peripheral enhancement and centripetal filling most suggestive of a tiny hemangioma. No focal concerning liver lesions are seen. Patient is post cholecystectomy. There is intra and extrahepatic biliary ductal dilatation with the common bile duct measuring up to 11 mm in maximal diameter. Shifting serpentine filling defects  within the anti dependent portions of the liver as well as within the common bile duct on both thick slice T2 imaging and source images are most likely to reflect artifact related to pneumobilia seen on multiple prior comparison studies. Normal distal tapering is seen on MIP reconstructions and source images. Pancreas: No mass, inflammatory changes, or other parenchymal abnormality identified. Spleen:  Within normal limits in size and appearance. Adrenals/Urinary Tract: No worrisome adrenal lesions. Minimal scarring in the upper pole right kidney. Few T2 hyperintense simple appearing subcentimeter cystic foci in both kidneys. No concerning focal masses identified. No evidence of hydronephrosis. Stomach/Bowel: Visualized portions within the abdomen are unremarkable. Vascular/Lymphatic: No pathologically enlarged lymph nodes identified. No abdominal aortic aneurysm demonstrated. Other:  None. Musculoskeletal: No suspicious bone lesions identified. Visible cord signal is unremarkable. IMPRESSION: 1. Status post cholecystectomy. There is intra and extrahepatic biliary ductal dilatation with the common bile duct measuring up to 11 mm in maximal diameter which is likely within expected normal for post cholecystectomy reservoir effect. 2. Shifting serpentine filling defect predominantly within the common bile duct but also seen in the anti dependent portions of the liver are favored to reflect artifact related to pneumobilia rather than true filling defects. No obstructing gallstones are evident. Normal distal tapering is seen on MIP reconstructions and source images. 3. No acute pancreatic inflammation. 4. Tiny 7 mm focus in the periphery of segment VIII most compatible with tiny hepatic hemangioma. 5. No acute findings are identified in the abdomen. Electronically Signed   By: Lovena Le M.D.   On: 12/28/2019 19:12  Assessment / Plan:   Assessment: 1.  Abdominal pain, nausea/vomiting and elevated LFTs: CTAP  without contrast 7/30 consistent with status post CCY with pneumobilia and hepatic steatosis, LFTs drifting downward, MRCP yesterday with question of stones in the bile duct 2.  E. coli bacteremia: Etiology unclear, urine culture showed no growth at 48 hours, white count trending down, previous fevers, currently on ceftriaxone day 4, question whether this is coming from the bile duct 3.  History of chronic pancreatitis 4.  Chronic narcotic use due to chronic pancreatitis associated abdominal pain  Plan: 1.  MRCP yesterday with question of filling defect predominantly within the common bile duct.  Discussed with Dr. Ardis Hughs who would like to proceed with ERCP tomorrow. 2.  Scheduled patient for an ERCP tomorrow with Dr. Ardis Hughs.  Did discuss risks, benefits, limitations and alternatives and the patient agrees to proceed. 3.  Patient will be n.p.o. after midnight.  Continue current antibiotics. 4.  No Indomethacin suppositories ordered status post procedure tomorrow due to patient's hypersensitivity with hives and shortness of breath 5.  Please await any further recommendations from Dr. Ardis Hughs later today  Thank you for kind consultation, we will continue to follow.   LOS: 2 days   Levin Erp  12/29/2019, 10:10 AM  ________________________________________________________________________  Velora Heckler GI MD note:  I personally examined the patient, reviewed the data and agree with the assessment and plan described above.  His bile duct is not normal on MR and this finding may represent biodebris buildup that we know can occur in some patients after cholecystectomy and/or remote ERCP.  We have no other potential Ecoli source.  His transiently elevated liver tests also support potential bile duct pathology.  I am planning ERCP tomorrow.   Owens Loffler, MD [facility] Gastroenterology Pager [phone]

## 2019-12-29 NOTE — H&P (View-Only) (Signed)
Progress Note   Subjective  Chief Complaint: Elevated LFTs, E. coli bacteremia  This morning the patient tells me that he continues to feel better.  He tolerated a regular diet today.  He has had bowel movements.  Denies any nausea or vomiting.   Objective   Vital signs in last 24 hours: Temp:  [97.9 F (36.6 C)-99 F (37.2 C)] 97.9 F (36.6 C) (08/03 0453) Pulse Rate:  [56-92] 56 (08/03 0453) Resp:  [11-21] 18 (08/03 0453) BP: (120-143)/(76-92) 136/77 (08/03 0453) SpO2:  [93 %-97 %] 95 % (08/03 0453) Last BM Date: 12/28/19 General:    white male in NAD Heart:  Regular rate and rhythm; no murmurs Lungs: Respirations even and unlabored, lungs CTA bilaterally Abdomen:  Soft, nontender and nondistended. Normal bowel sounds. Extremities:  Without edema. Neurologic:  Alert and oriented,  grossly normal neurologically. Psych:  Cooperative. Normal mood and affect.  Intake/Output from previous day: 08/02 0701 - 08/03 0700 In: 580 [P.O.:480; IV Piggyback:100] Out: -   Lab Results: Recent Labs    12/27/19 0041 12/28/19 0502 12/29/19 0548  WBC 11.5* 10.7* 7.6  HGB 15.5 13.7 13.7  HCT 44.0 39.0 38.5*  PLT 167 137* 153   BMET Recent Labs    12/27/19 0041 12/28/19 0502  NA 139 137  K 3.5 3.3*  CL 105 104  CO2 23 22  GLUCOSE 99 106*  BUN 14 11  CREATININE 1.04 0.84  CALCIUM 9.2 8.9   LFT Recent Labs    12/28/19 0502  PROT 6.5  ALBUMIN 3.5  AST 30  ALT 60*  ALKPHOS 70  BILITOT 0.4   Studies/Results: MR 3D Recon At Scanner  Result Date: 12/28/2019 CLINICAL DATA:  Chronic abdominal pain, possible chronic pancreatitis. Elevated LFTs and bacteremia. EXAM: MRI ABDOMEN WITHOUT AND WITH CONTRAST (INCLUDING MRCP) TECHNIQUE: Multiplanar multisequence MR imaging of the abdomen was performed both before and after the administration of intravenous contrast. Heavily T2-weighted images of the biliary and pancreatic ducts were obtained, and three-dimensional MRCP images  were rendered by post processing. CONTRAST:  64mL GADAVIST GADOBUTROL 1 MMOL/ML IV SOLN COMPARISON:  CT abdomen pelvis 12/25/2019 FINDINGS: Lower chest: No acute findings in the lower chest. Cardiac size within normal limits. No large pericardial effusion. Hepatobiliary: Liver size within expected normals. Smooth surface contour. Normal intrinsic signal without significant dropout on in or out of phase imaging. Tiny 7 mm T1 hypointense focus with some nodular peripheral enhancement and centripetal filling most suggestive of a tiny hemangioma. No focal concerning liver lesions are seen. Patient is post cholecystectomy. There is intra and extrahepatic biliary ductal dilatation with the common bile duct measuring up to 11 mm in maximal diameter. Shifting serpentine filling defects within the anti dependent portions of the liver as well as within the common bile duct on both thick slice T2 imaging and source images are most likely to reflect artifact related to pneumobilia seen on multiple prior comparison studies. Normal distal tapering is seen on MIP reconstructions and source images. Pancreas: No mass, inflammatory changes, or other parenchymal abnormality identified. Spleen:  Within normal limits in size and appearance. Adrenals/Urinary Tract: No worrisome adrenal lesions. Minimal scarring in the upper pole right kidney. Few T2 hyperintense simple appearing subcentimeter cystic foci in both kidneys. No concerning focal masses identified. No evidence of hydronephrosis. Stomach/Bowel: Visualized portions within the abdomen are unremarkable. Vascular/Lymphatic: No pathologically enlarged lymph nodes identified. No abdominal aortic aneurysm demonstrated. Other:  None. Musculoskeletal: No suspicious bone lesions identified.  Visible cord signal is unremarkable. IMPRESSION: 1. Status post cholecystectomy. There is intra and extrahepatic biliary ductal dilatation with the common bile duct measuring up to 11 mm in maximal  diameter which is likely within expected normal for post cholecystectomy reservoir effect. 2. Shifting serpentine filling defect predominantly within the common bile duct but also seen in the anti dependent portions of the liver are favored to reflect artifact related to pneumobilia rather than true filling defects. No obstructing gallstones are evident. Normal distal tapering is seen on MIP reconstructions and source images. 3. No acute pancreatic inflammation. 4. Tiny 7 mm focus in the periphery of segment VIII most compatible with tiny hepatic hemangioma. 5. No acute findings are identified in the abdomen. Electronically Signed   By: Lovena Le M.D.   On: 12/28/2019 19:12   MR ABDOMEN MRCP W WO CONTAST  Result Date: 12/28/2019 CLINICAL DATA:  Chronic abdominal pain, possible chronic pancreatitis. Elevated LFTs and bacteremia. EXAM: MRI ABDOMEN WITHOUT AND WITH CONTRAST (INCLUDING MRCP) TECHNIQUE: Multiplanar multisequence MR imaging of the abdomen was performed both before and after the administration of intravenous contrast. Heavily T2-weighted images of the biliary and pancreatic ducts were obtained, and three-dimensional MRCP images were rendered by post processing. CONTRAST:  75mL GADAVIST GADOBUTROL 1 MMOL/ML IV SOLN COMPARISON:  CT abdomen pelvis 12/25/2019 FINDINGS: Lower chest: No acute findings in the lower chest. Cardiac size within normal limits. No large pericardial effusion. Hepatobiliary: Liver size within expected normals. Smooth surface contour. Normal intrinsic signal without significant dropout on in or out of phase imaging. Tiny 7 mm T1 hypointense focus with some nodular peripheral enhancement and centripetal filling most suggestive of a tiny hemangioma. No focal concerning liver lesions are seen. Patient is post cholecystectomy. There is intra and extrahepatic biliary ductal dilatation with the common bile duct measuring up to 11 mm in maximal diameter. Shifting serpentine filling defects  within the anti dependent portions of the liver as well as within the common bile duct on both thick slice T2 imaging and source images are most likely to reflect artifact related to pneumobilia seen on multiple prior comparison studies. Normal distal tapering is seen on MIP reconstructions and source images. Pancreas: No mass, inflammatory changes, or other parenchymal abnormality identified. Spleen:  Within normal limits in size and appearance. Adrenals/Urinary Tract: No worrisome adrenal lesions. Minimal scarring in the upper pole right kidney. Few T2 hyperintense simple appearing subcentimeter cystic foci in both kidneys. No concerning focal masses identified. No evidence of hydronephrosis. Stomach/Bowel: Visualized portions within the abdomen are unremarkable. Vascular/Lymphatic: No pathologically enlarged lymph nodes identified. No abdominal aortic aneurysm demonstrated. Other:  None. Musculoskeletal: No suspicious bone lesions identified. Visible cord signal is unremarkable. IMPRESSION: 1. Status post cholecystectomy. There is intra and extrahepatic biliary ductal dilatation with the common bile duct measuring up to 11 mm in maximal diameter which is likely within expected normal for post cholecystectomy reservoir effect. 2. Shifting serpentine filling defect predominantly within the common bile duct but also seen in the anti dependent portions of the liver are favored to reflect artifact related to pneumobilia rather than true filling defects. No obstructing gallstones are evident. Normal distal tapering is seen on MIP reconstructions and source images. 3. No acute pancreatic inflammation. 4. Tiny 7 mm focus in the periphery of segment VIII most compatible with tiny hepatic hemangioma. 5. No acute findings are identified in the abdomen. Electronically Signed   By: Lovena Le M.D.   On: 12/28/2019 19:12  Assessment / Plan:   Assessment: 1.  Abdominal pain, nausea/vomiting and elevated LFTs: CTAP  without contrast 7/30 consistent with status post CCY with pneumobilia and hepatic steatosis, LFTs drifting downward, MRCP yesterday with question of stones in the bile duct 2.  E. coli bacteremia: Etiology unclear, urine culture showed no growth at 48 hours, white count trending down, previous fevers, currently on ceftriaxone day 4, question whether this is coming from the bile duct 3.  History of chronic pancreatitis 4.  Chronic narcotic use due to chronic pancreatitis associated abdominal pain  Plan: 1.  MRCP yesterday with question of filling defect predominantly within the common bile duct.  Discussed with Dr. Ardis Hughs who would like to proceed with ERCP tomorrow. 2.  Scheduled patient for an ERCP tomorrow with Dr. Ardis Hughs.  Did discuss risks, benefits, limitations and alternatives and the patient agrees to proceed. 3.  Patient will be n.p.o. after midnight.  Continue current antibiotics. 4.  No Indomethacin suppositories ordered status post procedure tomorrow due to patient's hypersensitivity with hives and shortness of breath 5.  Please await any further recommendations from Dr. Ardis Hughs later today  Thank you for kind consultation, we will continue to follow.   LOS: 2 days   Levin Erp  12/29/2019, 10:10 AM  ________________________________________________________________________  Velora Heckler GI MD note:  I personally examined the patient, reviewed the data and agree with the assessment and plan described above.  His bile duct is not normal on MR and this finding may represent biodebris buildup that we know can occur in some patients after cholecystectomy and/or remote ERCP.  We have no other potential Ecoli source.  His transiently elevated liver tests also support potential bile duct pathology.  I am planning ERCP tomorrow.   Owens Loffler, MD [facility] Gastroenterology Pager [phone]

## 2019-12-30 ENCOUNTER — Inpatient Hospital Stay (HOSPITAL_COMMUNITY): Payer: BLUE CROSS/BLUE SHIELD | Admitting: Anesthesiology

## 2019-12-30 ENCOUNTER — Inpatient Hospital Stay (HOSPITAL_COMMUNITY): Payer: BLUE CROSS/BLUE SHIELD

## 2019-12-30 ENCOUNTER — Other Ambulatory Visit: Payer: Self-pay

## 2019-12-30 ENCOUNTER — Encounter (HOSPITAL_COMMUNITY)
Admission: EM | Disposition: A | Discharge status: Home / Self Care | Payer: Self-pay | Source: Home / Self Care | Attending: Internal Medicine

## 2019-12-30 ENCOUNTER — Encounter (HOSPITAL_COMMUNITY): Payer: Self-pay | Admitting: Internal Medicine

## 2019-12-30 ENCOUNTER — Other Ambulatory Visit: Payer: Self-pay | Admitting: Family Medicine

## 2019-12-30 ENCOUNTER — Encounter: Payer: Self-pay | Admitting: Family Medicine

## 2019-12-30 DIAGNOSIS — R7881 Bacteremia: Secondary | ICD-10-CM | POA: Diagnosis present

## 2019-12-30 DIAGNOSIS — R109 Unspecified abdominal pain: Secondary | ICD-10-CM

## 2019-12-30 DIAGNOSIS — R933 Abnormal findings on diagnostic imaging of other parts of digestive tract: Secondary | ICD-10-CM

## 2019-12-30 DIAGNOSIS — B9689 Other specified bacterial agents as the cause of diseases classified elsewhere: Secondary | ICD-10-CM

## 2019-12-30 DIAGNOSIS — K8050 Calculus of bile duct without cholangitis or cholecystitis without obstruction: Secondary | ICD-10-CM

## 2019-12-30 DIAGNOSIS — G8929 Other chronic pain: Secondary | ICD-10-CM

## 2019-12-30 DIAGNOSIS — G894 Chronic pain syndrome: Secondary | ICD-10-CM

## 2019-12-30 DIAGNOSIS — B9620 Unspecified Escherichia coli [E. coli] as the cause of diseases classified elsewhere: Secondary | ICD-10-CM

## 2019-12-30 HISTORY — PX: REMOVAL OF STONES: SHX5545

## 2019-12-30 HISTORY — PX: ERCP: SHX5425

## 2019-12-30 LAB — COMPREHENSIVE METABOLIC PANEL
ALT: 48 U/L — ABNORMAL HIGH (ref 0–44)
AST: 24 U/L (ref 15–41)
Albumin: 4.3 g/dL (ref 3.5–5.0)
Alkaline Phosphatase: 78 U/L (ref 38–126)
Anion gap: 12 (ref 5–15)
BUN: 15 mg/dL (ref 6–20)
CO2: 25 mmol/L (ref 22–32)
Calcium: 9.3 mg/dL (ref 8.9–10.3)
Chloride: 104 mmol/L (ref 98–111)
Creatinine, Ser: 0.83 mg/dL (ref 0.61–1.24)
GFR calc Af Amer: >60 mL/min (ref >60)
GFR calc non Af Amer: >60 mL/min (ref >60)
Glucose, Bld: 94 mg/dL (ref 70–99)
Potassium: 3.7 mmol/L (ref 3.5–5.1)
Sodium: 141 mmol/L (ref 135–145)
Total Bilirubin: 0.4 mg/dL (ref 0.3–1.2)
Total Protein: 7.6 g/dL (ref 6.5–8.1)

## 2019-12-30 LAB — CBC
HCT: 41.3 % (ref 39.0–52.0)
Hemoglobin: 14.6 g/dL (ref 13.0–17.0)
MCH: 29.9 pg (ref 26.0–34.0)
MCHC: 35.4 g/dL (ref 30.0–36.0)
MCV: 84.5 fL (ref 80.0–100.0)
Platelets: 186 K/uL (ref 150–400)
RBC: 4.89 MIL/uL (ref 4.22–5.81)
RDW: 12.5 % (ref 11.5–15.5)
WBC: 10.7 K/uL — ABNORMAL HIGH (ref 4.0–10.5)
nRBC: 0 % (ref 0.0–0.2)

## 2019-12-30 LAB — MAGNESIUM: Magnesium: 2.2 mg/dL (ref 1.7–2.4)

## 2019-12-30 LAB — POTASSIUM: Potassium: 3.7 mmol/L (ref 3.5–5.1)

## 2019-12-30 SURGERY — ERCP, WITH INTERVENTION IF INDICATED
Anesthesia: General

## 2019-12-30 MED ORDER — SULFAMETHOXAZOLE-TRIMETHOPRIM 800-160 MG PO TABS
1.0000 | ORAL_TABLET | Freq: Two times a day (BID) | ORAL | 0 refills | Status: AC
Start: 2019-12-30 — End: 2020-01-06

## 2019-12-30 MED ORDER — SODIUM CHLORIDE 0.9 % IV SOLN
INTRAVENOUS | Status: DC | PRN
  Administered 2019-12-30: 30 mL

## 2019-12-30 MED ORDER — GLUCAGON HCL RDNA (DIAGNOSTIC) 1 MG IJ SOLR
INTRAMUSCULAR | Status: AC
  Filled 2019-12-30: qty 1

## 2019-12-30 MED ORDER — MIDAZOLAM HCL 2 MG/2ML IJ SOLN
INTRAMUSCULAR | Status: AC
  Filled 2019-12-30: qty 2

## 2019-12-30 MED ORDER — INDOMETHACIN 50 MG RE SUPP
RECTAL | Status: AC
  Filled 2019-12-30: qty 2

## 2019-12-30 MED ORDER — FENTANYL CITRATE (PF) 100 MCG/2ML IJ SOLN
INTRAMUSCULAR | Status: AC
  Filled 2019-12-30: qty 2

## 2019-12-30 MED ORDER — ALUM & MAG HYDROXIDE-SIMETH 200-200-20 MG/5ML PO SUSP
30.0000 mL | ORAL | Status: DC | PRN
  Administered 2019-12-30: 30 mL via ORAL
  Filled 2019-12-30: qty 30

## 2019-12-30 MED ORDER — EPHEDRINE SULFATE-NACL 50-0.9 MG/10ML-% IV SOSY
PREFILLED_SYRINGE | INTRAVENOUS | Status: DC | PRN
  Administered 2019-12-30: 5 mg via INTRAVENOUS

## 2019-12-30 MED ORDER — ONDANSETRON HCL 4 MG/2ML IJ SOLN
INTRAMUSCULAR | Status: DC | PRN
  Administered 2019-12-30: 4 mg via INTRAVENOUS

## 2019-12-30 MED ORDER — LIDOCAINE HCL (CARDIAC) PF 100 MG/5ML IV SOSY
PREFILLED_SYRINGE | INTRAVENOUS | Status: DC | PRN
  Administered 2019-12-30: 50 mg via INTRAVENOUS

## 2019-12-30 MED ORDER — PROPOFOL 10 MG/ML IV BOLUS
INTRAVENOUS | Status: DC | PRN
  Administered 2019-12-30: 80 mg via INTRAVENOUS

## 2019-12-30 MED ORDER — SUGAMMADEX SODIUM 200 MG/2ML IV SOLN
INTRAVENOUS | Status: DC | PRN
  Administered 2019-12-30: 300 mg via INTRAVENOUS

## 2019-12-30 MED ORDER — MIDAZOLAM HCL 5 MG/5ML IJ SOLN
INTRAMUSCULAR | Status: DC | PRN
  Administered 2019-12-30: 2 mg via INTRAVENOUS

## 2019-12-30 MED ORDER — LACTATED RINGERS IV SOLN: INTRAVENOUS | Status: DC

## 2019-12-30 MED ORDER — NICOTINE 14 MG/24HR TD PT24: 14.0000 mg | MEDICATED_PATCH | Freq: Every day | TRANSDERMAL | 0 refills | Status: DC

## 2019-12-30 MED ORDER — PROPOFOL 10 MG/ML IV BOLUS
INTRAVENOUS | Status: AC
  Filled 2019-12-30: qty 20

## 2019-12-30 MED ORDER — ROCURONIUM BROMIDE 100 MG/10ML IV SOLN
INTRAVENOUS | Status: DC | PRN
  Administered 2019-12-30: 60 mg via INTRAVENOUS

## 2019-12-30 MED ORDER — FENTANYL CITRATE (PF) 100 MCG/2ML IJ SOLN
INTRAMUSCULAR | Status: DC | PRN
  Administered 2019-12-30 (×2): 50 ug via INTRAVENOUS

## 2019-12-30 NOTE — Op Note (Signed)
[facility] Patient Name: [name] Procedure Date: 12/30/2019 MRN: [id number] Attending MD: Milus Banister , MD Date of Birth: 08/09/1975 CSN: [id number] Age: 44 Admit Type: Inpatient Procedure:                ERCP Indications:              Remote lap chole and remote ERCP; admitted with E                            coli/enterobacter bacteremia, transiently elevated                            liver tests, abnormal MRCP Providers:                Milus Banister, MD, Cleda Daub, RN, Tyna Jaksch Technician, Laverda Sorenson, Technician Referring MD:              Medicines:                General Anesthesia, ceftriaxone in hospital Complications:            No immediate complications. Estimated blood loss:                            None Estimated Blood Loss:     Estimated blood loss: none. Procedure:                Pre-Anesthesia Assessment:                           - Prior to the procedure, a History and Physical                            was performed, and patient medications and                            allergies were reviewed. The patient's tolerance of                            previous anesthesia was also reviewed. The risks                            and benefits of the procedure and the sedation                            options and risks were discussed with the patient.                            All questions were answered, and informed consent                            was obtained. Prior Anticoagulants: The patient has  taken no previous anticoagulant or antiplatelet                            agents. ASA Grade Assessment: II - A patient with                            mild systemic disease. After reviewing the risks                            and benefits, the patient was deemed in                            satisfactory condition to undergo the procedure.                           After  obtaining informed consent, the scope was                            passed under direct vision. Throughout the                            procedure, the patient's blood pressure, pulse, and                            oxygen saturations were monitored continuously. The                            TJF-Q180V ([id number] Olympus Doudenoscope was                            introduced through the mouth, and used to inject                            contrast into and used to inject contrast into the                            bile duct. The ERCP was accomplished without                            difficulty. The patient tolerated the procedure                            well. Scope In: Scope Out: Findings:      Scout film showed surgical clips in RUQ. The duodenoscope was advanced       to the region of the normal papilla without detailed examination of the       UGI tract. The major papilla showed evidence of remote biliary       sphincterotomy. The bile duct was easily cannulated with a 44 Autotome       over a .035 hydrawire and contrast was injected. The extrahepatic       biliary tree was diffusely dilated (CBD 27mm). The cystic duct remnant       opacified. There was no evidence of biliary leak. There was a suggestion  of mobile amorphous biliary filling defect. Without widening the       previous sphincterotomy I swept the duct several times with a 12-15mm       biliary balloon and delivered a moderate amount of amorphous, brown       sludge, stone debris into the duodenum. There was no purulence. A       completion, occlusion cholangiogram revealed no obvious remaining       filling defects. The main pancreatic duct was never cannulated with the       wire or injected with dye. Impression:               - Choledocholithiasis was found (brown, soft                            amorphous stone debris) and was removed with a                            biliary retrieval balloon without  need for                            extending the previous (remote) sphincerotomy site.                           - There was no purulence however I have good                            suspicion that this was indeed the source of his                            recent gram negative bacteremia, transiently                            elevated liver tests. Moderate Sedation:      Not Applicable - Patient had care per Anesthesia. Recommendation:           - OK to discharge home today from my perspective.                           - He should complete another 3 days of appropriate                            oral antibiotics (such as cipro 500 BID).                           - Follow up with GI Dr. Henrene Pastor as needed. Procedure Code(s):        --- Professional ---                           [id number], Endoscopic retrograde                            cholangiopancreatography (ERCP); with removal of                            calculi/debris from biliary/pancreatic duct(s) Diagnosis  Code(s):        --- Professional ---                           K80.50, Calculus of bile duct without cholangitis                            or cholecystitis without obstruction CPT copyright 2019 [employer]. All rights reserved. The codes documented in this report are preliminary and upon coder review may  be revised to meet current compliance requirements. Milus Banister, MD 12/30/2019 2:09:47 PM This report has been signed electronically. Number of Addenda: 0

## 2019-12-30 NOTE — Transfer of Care (Signed)
Immediate Anesthesia Transfer of Care Note  Patient: [name]  Procedure(s) Performed: ENDOSCOPIC RETROGRADE CHOLANGIOPANCREATOGRAPHY (ERCP) (N/A )  Patient Location: PACU  Anesthesia Type:General  Level of Consciousness: awake, alert , oriented and patient cooperative  Airway & Oxygen Therapy: Patient Spontanous Breathing and Patient connected to face mask oxygen  Post-op Assessment: Report given to RN, Post -op Vital signs reviewed and stable and Patient moving all extremities  Post vital signs: Reviewed and stable  Last Vitals:  Vitals Value Taken Time  BP 154/94 12/30/19 1412  Temp    Pulse 76 12/30/19 1413  Resp 14 12/30/19 1413  SpO2 99 % 12/30/19 1413  Vitals shown include unvalidated device data.  Last Pain:  Vitals:   12/30/19 1201  TempSrc: Oral  PainSc: 5       Patients Stated Pain Goal: 3 (15/04/13 6438)  Complications: No complications documented.

## 2019-12-30 NOTE — Interval H&P Note (Signed)
History and Physical Interval Note:  12/30/2019 11:49 AM  W[facility] Fillers Dierdre Highman  has presented today for surgery, with the diagnosis of Abnormal MRCP.  The various methods of treatment have been discussed with the patient and family. After consideration of risks, benefits and other options for treatment, the patient has consented to  Procedure(s): ENDOSCOPIC RETROGRADE CHOLANGIOPANCREATOGRAPHY (ERCP) (N/A) as a surgical intervention.  The patient's history has been reviewed, patient examined, no change in status, stable for surgery.  I have reviewed the patient's chart and labs.  Questions were answered to the patient's satisfaction.     Milus Banister

## 2019-12-30 NOTE — Progress Notes (Signed)
Patient states he normally takes his metoprolol 50 mg in the morning then 50mg  in the evening. He also is unsure why he is not taking his amlodipine currently. Nurse encouraged the patient to speak with the rounding doctor in the morning. Will communicate with morning nurse to discuss with rounding physician as well.

## 2019-12-30 NOTE — Anesthesia Postprocedure Evaluation (Signed)
Anesthesia Post Note  Patient: [name]  Procedure(s) Performed: ENDOSCOPIC RETROGRADE CHOLANGIOPANCREATOGRAPHY (ERCP) (N/A ) REMOVAL OF STONES     Patient location during evaluation: PACU Anesthesia Type: General Level of consciousness: awake and alert Pain management: pain level controlled Vital Signs Assessment: post-procedure vital signs reviewed and stable Respiratory status: spontaneous breathing, nonlabored ventilation, respiratory function stable and patient connected to nasal cannula oxygen Cardiovascular status: blood pressure returned to baseline and stable Postop Assessment: no apparent nausea or vomiting Anesthetic complications: no   No complications documented.  Last Vitals:  Vitals:   12/30/19 1430 12/30/19 1440  BP: (!) 134/98 (!) 139/92  Pulse: 65 64  Resp: 18 16  Temp:    SpO2: 98% 96%    Last Pain:  Vitals:   12/30/19 1440  TempSrc:   PainSc: 0-No pain                 Effie Berkshire

## 2019-12-30 NOTE — Discharge Summary (Signed)
Physician Discharge Summary  [name] EVO:[id number] DOB: 1976/02/02 DOA: 12/27/2019  PCP: Martinique, Betty G, MD  Admit date: 12/27/2019 Discharge date: 12/30/2019  Time spent: 60 minutes  Recommendations for Outpatient Follow-up:  1. Follow-up with Martinique, Betty G, MD in 1 to 2 weeks.  On follow-up patient will need a basic metabolic profile done to follow-up on electrolytes and renal function. 2. Follow-up with Dr. Scarlette Shorts, gastroenterology as needed.   Discharge Diagnoses:  Principal Problem:   E coli bacteremia Active Problems:   Bacteremia due to Enterobacter species   Hypertension, essential, benign   Chronic abdominal pain   Chronic pain disorder   Bacteremia   QT prolongation   Abnormal magnetic resonance cholangiopancreatography (MRCP)   Choledocholithiasis   Discharge Condition: Stable and improved  Diet recommendation: Heart healthy  Filed Weights   12/26/19 2350 12/30/19 1201  Weight: (!) 93.2 kg 93.4 kg    History of present illness:  HPI per Dr. Kristen Cardinal is a 44 y.o. male with medical history significant of chronic pancreatitis?, chronic abdominal pain on opioids, nephrolithiasis, hypertension initially presented to the ED on 7/30 with complaints of abdominal pain, nausea, and vomiting.  Labs done at that time showing WBC count 16.9.  AST 132, ALT 94.  Lipase normal.  While in the ED he developed fever and chills.  Abdominal CT done at that time did not show any acute findings to explain patient's fever.  Chest x-ray without evidence of pneumonia.  UA not suggestive of infection.  Covid swab negative.  Rapid HIV screen was negative.  Hepatitis panel negative.  Testing for Lyme disease and RMSF was sent off.  Blood and urine cultures ordered.  Patient was discharged from the ED.  His blood culture came back positive for E. coli and Enterobacteriaceae.  He was advised to return to the ED.  Patient states he has continued to have fever since he  left the ED.  States he has chronic abdominal pain from pancreatitis which is unchanged from baseline.  He continues to feel nauseous but is no longer vomiting.  Denies cough, shortness of breath, or chest pain.  Denies dysuria or urinary frequency/urgency.  No other complaints.  ED Course: Afebrile.  Not tachycardic or hypotensive.  WBC count 11.5.  Leukocytosis improved compared to labs done 2 days ago.  AST 59 and ALT 92.  Mild transaminitis improved compared to labs done 2 days ago.  Repeat Covid swab negative.  Patient was given fentanyl, Zofran, cefepime, and 1.5 L normal saline boluses.  Hospital Course:  1 E. coli/Enterobacter bacteremia likely secondary to biliary source/choledocholithiasis. Noted on blood cultures ordered on presentation to the ED on 12/25/2019.  Patient was called back due to bacteremia, admitted and placed on IV Rocephin and IV Flagyl added.  Patient noted to have a history of cholecystectomy and CT abdomen and pelvis done during prior presentation to the ED showed pneumobilia, chronically absent gallbladder, no evidence of pancreatitis or pyelonephritis on CT.  MRCP done did reveal cholestasis/pneumobilia.  GI was consulted who assessed and followed the patient during the hospitalization.  Patient's leukocytosis improved.  Patient remained afebrile.  Patient improved clinically and was close to baseline by day of discharge.  Patient subsequently underwent a ERCP with removal of calculi/debris from biliary/pancreatic ducts.  No noted purulence noted however per GI suspected source of bacteremia likely from the biliary tree.  Case discussed with ID, Dr. Megan Salon who after review of patient's chart, sensitivities from  E. coli/Enterococcus bacteremia had recommended patient be discharged on either Keflex or Septra DS twice daily to complete a 10-day course of antibiotic treatment.  Patient be discharged on Septra DS twice daily x7 more days to complete a 10-day course of  antibiotic treatment due to concern for biliary source and recent instrumentation..  Patient was discharged in stable and improved condition to follow-up with PCP in 1 to 2 weeks.  Per GI may follow-up with Dr. Henrene Pastor as needed.  2.  Chronic abdominal pain Patient noted to have several lipases done over the years.  During this admission patient's reported mostly left upper quadrant pain.  Lipase levels within normal limits.  Patient was on IV antibiotics secondary to problem #1.  Patient maintained on home regimen of pain medications and stated by day of discharge he was back to his baseline chronic abdominal pain.  Outpatient follow-up.  3.  Dyspepsia Patient reported some nausea associated with chronic abdominal pain.  Patient was maintained on antiemetics during the hospitalization.  Patient improved clinically.  Outpatient follow-up.  4.  Hypertension Patient maintained on home regimen Metroprolol.  Patient's Norvasc/ACE inhibitor was held and will be resumed on discharge.  5.  Prolonged QT interval Noted on admission.  Repeat EKG with resolution of QT prolongation.  6.  Tobacco abuse Tobacco cessation stressed to patient.  Patient maintained on a nicotine patch.  7.  Mild hypokalemia Repleted.  Procedures:  MRCP 12/28/2019  ERCP with removal of calculi/debris from biliary/pancreatic ducts per Dr. Ardis Hughs gastroenterology 12/30/2019  Consultations:  Gastroenterology: Dr. Bryan Lemma 12/27/2019  Curb sided ID: Dr. Megan Salon 12/30/2019  Discharge Exam: Vitals:   12/30/19 1430 12/30/19 1440  BP: (!) 134/98 (!) 139/92  Pulse: 65 64  Resp: 18 16  Temp:    SpO2: 98% 96%    General: NAD Cardiovascular: RRR Respiratory: CTAB  Discharge Instructions   Discharge Instructions    Diet - low sodium heart healthy   Complete by: As directed    Increase activity slowly   Complete by: As directed      Allergies as of 12/30/2019      Reactions   Cortisone Other (See Comments)   drops  potassium level "bottoms out" potassium level drops potassium level Bottoms out potassium "bottoms out" potassium level Other reaction(s): Other (See Comments) Bottoms out potassium   Eggs Or Egg-derived Products Hives, Other (See Comments)   Rash Rash   Ketorolac Hives, Other (See Comments)   Hives, slightly labored breathing Hives, slightly labored breathing   Ketorolac Tromethamine Hives, Shortness Of Breath   Haldol [haloperidol Lactate] Other (See Comments)   "jittery"   Haloperidol Other (See Comments)   "jittery" "jittery"   Hydrocortisone Hives   Also drops potassium level   Iodinated Diagnostic Agents Nausea And Vomiting, Other (See Comments)   Hives Hives Hives Hives   Ketorolac Tromethamine Hives   Reglan [metoclopramide]    Face "draws" and gets figgety      Medication List    TAKE these medications   amLODipine-benazepril 5-20 MG capsule Commonly known as: LOTREL TAKE 1 CAPSULE BY MOUTH EVERY DAY   fluticasone 50 MCG/ACT nasal spray Commonly known as: Flonase Place 1 spray into both nostrils 2 (two) times daily.   metoprolol succinate 100 MG 24 hr tablet Commonly known as: TOPROL-XL Take 1 tablet (100 mg total) by mouth daily. Take with or immediately following a meal.   naloxone 4 MG/0.1ML Liqd nasal spray kit Commonly known as: NARCAN 1 spray in  each nostril x 1 if opioid overdose.   nicotine 14 mg/24hr patch Commonly known as: NICODERM CQ - dosed in mg/24 hours Place 1 patch (14 mg total) onto the skin daily. Start taking on: December 31, 2019   omeprazole 20 MG capsule Commonly known as: PRILOSEC Take 1 capsule (20 mg total) by mouth daily.   ondansetron 8 MG disintegrating tablet Commonly known as: Zofran ODT Take 1 tablet (8 mg total) by mouth every 8 (eight) hours as needed for nausea or vomiting.   Oxycodone HCl 10 MG Tabs 1 tablet twice daily as needed, can take an extra tablet if needed.  No more than 70 tablets/month.    promethazine 25 MG tablet Commonly known as: PHENERGAN TAKE 1 TABLET (25 MG TOTAL) BY MOUTH EVERY 12 (TWELVE) HOURS AS NEEDED.   sulfamethoxazole-trimethoprim 800-160 MG tablet Commonly known as: BACTRIM DS Take 1 tablet by mouth 2 (two) times daily for 7 days.   Xtampza ER 9 MG C12a Generic drug: oxyCODONE ER Take 1 tablet by mouth 2 (two) times daily.      Allergies  Allergen Reactions  . Cortisone Other (See Comments)    drops potassium level "bottoms out" potassium level drops potassium level Bottoms out potassium  "bottoms out" potassium level Other reaction(s): Other (See Comments) Bottoms out potassium  . Eggs Or Egg-Derived Products Hives and Other (See Comments)    Rash  Rash  . Ketorolac Hives and Other (See Comments)    Hives, slightly labored breathing  Hives, slightly labored breathing  . Ketorolac Tromethamine Hives and Shortness Of Breath  . Haldol [Haloperidol Lactate] Other (See Comments)    "jittery"   . Haloperidol Other (See Comments)    "jittery" "jittery"   . Hydrocortisone Hives    Also drops potassium level  . Iodinated Diagnostic Agents Nausea And Vomiting and Other (See Comments)    Hives Hives Hives Hives  . Ketorolac Tromethamine Hives  . Reglan [Metoclopramide]     Face "draws" and gets figgety    Follow-up Information    Martinique, Betty G, MD. Schedule an appointment as soon as possible for a visit in 2 week(s).   Specialty: Family Medicine Why: Follow-up in 1 to 2 weeks. Contact information: [place] [address] [phone]        Irene Shipper, MD Follow up.   Specialty: Gastroenterology Why: Follow-up as needed. Contact information: [address] [place] [address] [phone]                The results of significant diagnostics from this hospitalization (including imaging, microbiology, ancillary and laboratory) are listed below for reference.    Significant Diagnostic  Studies: CT Abdomen Pelvis Wo Contrast  Result Date: 12/25/2019 CLINICAL DATA:  44 year old male with abdominal pain, nausea vomiting since 0200 hours. EXAM: CT ABDOMEN AND PELVIS WITHOUT CONTRAST TECHNIQUE: Multidetector CT imaging of the abdomen and pelvis was performed following the standard protocol without IV contrast. COMPARISON:  CT Abdomen and Pelvis 03/21/2019 and earlier. FINDINGS: Lower chest: Negative. Hepatobiliary: Chronically absent gallbladder and pneumobilia. Probable hepatic steatosis again noted. Pancreas: Negative, normal noncontrast appearance of the pancreas. No lesser sac inflammation. Spleen: Negative. Adrenals/Urinary Tract: Normal adrenal glands. Stable noncontrast kidneys. No hydronephrosis or perinephric stranding. Punctate left midpole nephrolithiasis versus renal vascular calcification. Both ureters are normal to the bladder. Chronic pelvic phleboliths. Unremarkable urinary bladder. Stomach/Bowel: Mildly redundant large bowel with fluid/liquid stool and gas. No dilated colon. Sequelae of appendectomy. Similar  gas and fluid-filled but nondilated small bowel. Decompressed stomach and duodenum. No free air, free fluid, mesenteric stranding. Vascular/Lymphatic: Normal caliber aorta. Mild atherosclerosis. Vascular patency is not evaluated in the absence of IV contrast. No lymphadenopathy. Reproductive: Negative. Other: No pelvic free fluid. Musculoskeletal: Negative. IMPRESSION: 1. No acute or inflammatory process identified in the noncontrast abdomen or pelvis. 2. Chronically absent gallbladder and pneumobilia. Probable chronic hepatic steatosis. 3. Punctate left nephrolithiasis versus a renal vascular calcification. Electronically Signed   By: Genevie Ann M.D.   On: 12/25/2019 22:27   DG Chest 2 View  Result Date: 12/25/2019 CLINICAL DATA:  Fever of unknown origin, admitted for pancreatitis EXAM: CHEST - 2 VIEW COMPARISON:  September 10, 2017 FINDINGS: Trachea midline. Cardiomediastinal  contours and hilar structures are normal. Lungs are clear.  No signs of pleural effusion. Visualized skeletal structures on limited assessment are unremarkable. IMPRESSION: No acute cardiopulmonary disease. Electronically Signed   By: Zetta Bills M.D.   On: 12/25/2019 19:45   MR 3D Recon At Scanner  Result Date: 12/28/2019 CLINICAL DATA:  Chronic abdominal pain, possible chronic pancreatitis. Elevated LFTs and bacteremia. EXAM: MRI ABDOMEN WITHOUT AND WITH CONTRAST (INCLUDING MRCP) TECHNIQUE: Multiplanar multisequence MR imaging of the abdomen was performed both before and after the administration of intravenous contrast. Heavily T2-weighted images of the biliary and pancreatic ducts were obtained, and three-dimensional MRCP images were rendered by post processing. CONTRAST:  34m GADAVIST GADOBUTROL 1 MMOL/ML IV SOLN COMPARISON:  CT abdomen pelvis 12/25/2019 FINDINGS: Lower chest: No acute findings in the lower chest. Cardiac size within normal limits. No large pericardial effusion. Hepatobiliary: Liver size within expected normals. Smooth surface contour. Normal intrinsic signal without significant dropout on in or out of phase imaging. Tiny 7 mm T1 hypointense focus with some nodular peripheral enhancement and centripetal filling most suggestive of a tiny hemangioma. No focal concerning liver lesions are seen. Patient is post cholecystectomy. There is intra and extrahepatic biliary ductal dilatation with the common bile duct measuring up to 11 mm in maximal diameter. Shifting serpentine filling defects within the anti dependent portions of the liver as well as within the common bile duct on both thick slice T2 imaging and source images are most likely to reflect artifact related to pneumobilia seen on multiple prior comparison studies. Normal distal tapering is seen on MIP reconstructions and source images. Pancreas: No mass, inflammatory changes, or other parenchymal abnormality identified. Spleen:  Within  normal limits in size and appearance. Adrenals/Urinary Tract: No worrisome adrenal lesions. Minimal scarring in the upper pole right kidney. Few T2 hyperintense simple appearing subcentimeter cystic foci in both kidneys. No concerning focal masses identified. No evidence of hydronephrosis. Stomach/Bowel: Visualized portions within the abdomen are unremarkable. Vascular/Lymphatic: No pathologically enlarged lymph nodes identified. No abdominal aortic aneurysm demonstrated. Other:  None. Musculoskeletal: No suspicious bone lesions identified. Visible cord signal is unremarkable. IMPRESSION: 1. Status post cholecystectomy. There is intra and extrahepatic biliary ductal dilatation with the common bile duct measuring up to 11 mm in maximal diameter which is likely within expected normal for post cholecystectomy reservoir effect. 2. Shifting serpentine filling defect predominantly within the common bile duct but also seen in the anti dependent portions of the liver are favored to reflect artifact related to pneumobilia rather than true filling defects. No obstructing gallstones are evident. Normal distal tapering is seen on MIP reconstructions and source images. 3. No acute pancreatic inflammation. 4. Tiny 7 mm focus in the periphery of segment VIII most compatible with  tiny hepatic hemangioma. 5. No acute findings are identified in the abdomen. Electronically Signed   By: Lovena Le M.D.   On: 12/28/2019 19:12   DG ERCP BILIARY & PANCREATIC DUCTS  Result Date: 12/30/2019 CLINICAL DATA:  Bacteremia, abnormal LFTs, biliary dilatation by MRCP EXAM: ERCP with balloon sweep TECHNIQUE: Multiple spot images obtained with the fluoroscopic device and submitted for interpretation post-procedure. FLUOROSCOPY TIME:  Fluoroscopy Time:  3 minutes 33 seconds Number of Acquired Spot Images: Several spot fluoroscopic view COMPARISON:  12/25/2019, 12/28/2019 FINDINGS: Spot fluoroscopic views during the ERCP demonstrate retrograde  wire access and injection of the biliary tree. Biliary dilatation noted. Nonocclusive filling defects within the common bile duct may represent stones or debris. Biliary dilatation appears similar to the MRCP. Balloon sweep performed for clearing the duct. IMPRESSION: Limited ERCP imaging during the intervention demonstrates balloon sweep for removal of ductal debris/stones. Nonspecific biliary dilatation. These images were submitted for radiologic interpretation only. Please see the procedural report for the amount of contrast and the fluoroscopy time utilized. Electronically Signed   By: Jerilynn Mages.  Shick M.D.   On: 12/30/2019 14:21   MR ABDOMEN MRCP W WO CONTAST  Result Date: 12/28/2019 CLINICAL DATA:  Chronic abdominal pain, possible chronic pancreatitis. Elevated LFTs and bacteremia. EXAM: MRI ABDOMEN WITHOUT AND WITH CONTRAST (INCLUDING MRCP) TECHNIQUE: Multiplanar multisequence MR imaging of the abdomen was performed both before and after the administration of intravenous contrast. Heavily T2-weighted images of the biliary and pancreatic ducts were obtained, and three-dimensional MRCP images were rendered by post processing. CONTRAST:  105m GADAVIST GADOBUTROL 1 MMOL/ML IV SOLN COMPARISON:  CT abdomen pelvis 12/25/2019 FINDINGS: Lower chest: No acute findings in the lower chest. Cardiac size within normal limits. No large pericardial effusion. Hepatobiliary: Liver size within expected normals. Smooth surface contour. Normal intrinsic signal without significant dropout on in or out of phase imaging. Tiny 7 mm T1 hypointense focus with some nodular peripheral enhancement and centripetal filling most suggestive of a tiny hemangioma. No focal concerning liver lesions are seen. Patient is post cholecystectomy. There is intra and extrahepatic biliary ductal dilatation with the common bile duct measuring up to 11 mm in maximal diameter. Shifting serpentine filling defects within the anti dependent portions of the liver as  well as within the common bile duct on both thick slice T2 imaging and source images are most likely to reflect artifact related to pneumobilia seen on multiple prior comparison studies. Normal distal tapering is seen on MIP reconstructions and source images. Pancreas: No mass, inflammatory changes, or other parenchymal abnormality identified. Spleen:  Within normal limits in size and appearance. Adrenals/Urinary Tract: No worrisome adrenal lesions. Minimal scarring in the upper pole right kidney. Few T2 hyperintense simple appearing subcentimeter cystic foci in both kidneys. No concerning focal masses identified. No evidence of hydronephrosis. Stomach/Bowel: Visualized portions within the abdomen are unremarkable. Vascular/Lymphatic: No pathologically enlarged lymph nodes identified. No abdominal aortic aneurysm demonstrated. Other:  None. Musculoskeletal: No suspicious bone lesions identified. Visible cord signal is unremarkable. IMPRESSION: 1. Status post cholecystectomy. There is intra and extrahepatic biliary ductal dilatation with the common bile duct measuring up to 11 mm in maximal diameter which is likely within expected normal for post cholecystectomy reservoir effect. 2. Shifting serpentine filling defect predominantly within the common bile duct but also seen in the anti dependent portions of the liver are favored to reflect artifact related to pneumobilia rather than true filling defects. No obstructing gallstones are evident. Normal distal tapering is seen on  MIP reconstructions and source images. 3. No acute pancreatic inflammation. 4. Tiny 7 mm focus in the periphery of segment VIII most compatible with tiny hepatic hemangioma. 5. No acute findings are identified in the abdomen. Electronically Signed   By: Lovena Le M.D.   On: 12/28/2019 19:12    Microbiology: Recent Results (from the past 240 hour(s))  SARS Coronavirus 2 by RT PCR (hospital order, performed in [facility] hospital lab)  Nasopharyngeal Nasopharyngeal Swab     Status: None   Collection Time: 12/25/19  7:41 PM   Specimen: Nasopharyngeal Swab  Result Value Ref Range Status   SARS Coronavirus 2 NEGATIVE NEGATIVE Final    Comment: (NOTE) SARS-CoV-2 target nucleic acids are NOT DETECTED.  The SARS-CoV-2 RNA is generally detectable in upper and lower respiratory specimens during the acute phase of infection. The lowest concentration of SARS-CoV-2 viral copies this assay can detect is 250 copies / mL. A negative result does not preclude SARS-CoV-2 infection and should not be used as the sole basis for treatment or other patient management decisions.  A negative result may occur with improper specimen collection / handling, submission of specimen other than nasopharyngeal swab, presence of viral mutation(s) within the areas targeted by this assay, and inadequate number of viral copies (<250 copies / mL). A negative result must be combined with clinical observations, patient history, and epidemiological information.  Fact Sheet for Patients:   StrictlyIdeas.no  Fact Sheet for Healthcare Providers: BankingDealers.co.za  This test is not yet approved or  cleared by the [place] FDA and has been authorized for detection and/or diagnosis of SARS-CoV-2 by FDA under an Emergency Use Authorization (EUA).  This EUA will remain in effect (meaning this test can be used) for the duration of the COVID-19 declaration under Section 564(b)(1) of the Act, 21 U.S.C. section 360bbb-3(b)(1), unless the authorization is terminated or revoked sooner.  Performed at [facility], [address] [place], [place] [address]   Culture, blood (routine x 2)     Status: None (Preliminary result)   Collection Time: 12/25/19 10:28 PM   Specimen: BLOOD  Result Value Ref Range Status   Specimen Description   Final    BLOOD LEFT ANTECUBITAL Performed at [facility], [address] [place], [place] [address]    Special Requests   Final    BOTTLES DRAWN AEROBIC AND ANAEROBIC Blood Culture adequate volume Performed at [facility], [address] [place], [place] [address]    Culture   Final    NO GROWTH 4 DAYS Performed at [facility], [address] [place], [place] [address]    Report Status PENDING  Incomplete  Urine culture     Status: None   Collection Time: 12/25/19 10:54 PM   Specimen: Urine, Random  Result Value Ref Range Status   Specimen Description   Final    URINE, RANDOM Performed at [facility], [address] [place], [place] [address]    Special Requests   Final    NONE Performed at [facility], [address] [place], [place] [address]    Culture   Final    NO GROWTH Performed at [facility], [place] [address], [place], [place] [address]    Report Status 12/27/2019 FINAL  Final  Culture, blood (routine x 2)     Status: Abnormal   Collection Time: 12/25/19 11:00 PM  Specimen: BLOOD  Result Value Ref Range Status   Specimen Description   Final    BLOOD RIGHT ANTECUBITAL Performed at [facility], [address] [place], [place] [address]    Special Requests   Final    BOTTLES DRAWN AEROBIC AND ANAEROBIC Blood Culture adequate volume Performed at [facility], [address] [place], [place] [address]    Culture  Setup Time   Final    GRAM NEGATIVE RODS AEROBIC BOTTLE ONLY CRITICAL RESULT CALLED TO, READ BACK BY AND VERIFIED WITH: RN Chauncey Cruel [facility] AT 2119 12/26/19 BY L BENFIELD Performed at [facility], [place] [address], [place], [place] [address]    Culture ESCHERICHIA COLI (A)  Final   Report Status 12/28/2019 FINAL  Final   Organism ID, Bacteria ESCHERICHIA COLI  Final      Susceptibility   Escherichia coli - MIC*    AMPICILLIN >=32 RESISTANT Resistant     CEFAZOLIN 8 SENSITIVE Sensitive     CEFEPIME <=0.12 SENSITIVE Sensitive      CEFTAZIDIME <=1 SENSITIVE Sensitive     CEFTRIAXONE <=0.25 SENSITIVE Sensitive     CIPROFLOXACIN <=0.25 SENSITIVE Sensitive     GENTAMICIN <=1 SENSITIVE Sensitive     IMIPENEM <=0.25 SENSITIVE Sensitive     TRIMETH/SULFA <=20 SENSITIVE Sensitive     AMPICILLIN/SULBACTAM >=32 RESISTANT Resistant     PIP/TAZO <=4 SENSITIVE Sensitive     * ESCHERICHIA COLI  Blood Culture ID Panel (Reflexed)     Status: Abnormal   Collection Time: 12/25/19 11:00 PM  Result Value Ref Range Status   Enterococcus species NOT DETECTED NOT DETECTED Final   Listeria monocytogenes NOT DETECTED NOT DETECTED Final   Staphylococcus species NOT DETECTED NOT DETECTED Final   Staphylococcus aureus (BCID) NOT DETECTED NOT DETECTED Final   Streptococcus species NOT DETECTED NOT DETECTED Final   Streptococcus agalactiae NOT DETECTED NOT DETECTED Final   Streptococcus pneumoniae NOT DETECTED NOT DETECTED Final   Streptococcus pyogenes NOT DETECTED NOT DETECTED Final   Acinetobacter baumannii NOT DETECTED NOT DETECTED Final   Enterobacteriaceae species DETECTED (A) NOT DETECTED Final    Comment: CRITICAL RESULT CALLED TO, READ BACK BY AND VERIFIED WITH: RN S MAYNARD AT 2119 12/26/19 BY L BENFIELD Enterobacteriaceae represent a large family of gram-negative bacteria, not a single organism.    Enterobacter cloacae complex NOT DETECTED NOT DETECTED Final   Escherichia coli DETECTED (A) NOT DETECTED Final    Comment: CRITICAL RESULT CALLED TO, READ BACK BY AND VERIFIED WITH: RN Reynolds Bowl AT 2119 12/26/19 BY L BENFIELD    Klebsiella oxytoca NOT DETECTED NOT DETECTED Final   Klebsiella pneumoniae NOT DETECTED NOT DETECTED Final   Proteus species NOT DETECTED NOT DETECTED Final   Serratia marcescens NOT DETECTED NOT DETECTED Final   Carbapenem resistance NOT DETECTED NOT DETECTED Final   Haemophilus influenzae NOT DETECTED NOT DETECTED Final   Neisseria meningitidis NOT DETECTED NOT DETECTED Final   Pseudomonas aeruginosa  NOT DETECTED NOT DETECTED Final   Candida albicans NOT DETECTED NOT DETECTED Final   Candida glabrata NOT DETECTED NOT DETECTED Final   Candida krusei NOT DETECTED NOT DETECTED Final   Candida parapsilosis NOT DETECTED NOT DETECTED Final   Candida tropicalis NOT DETECTED NOT DETECTED Final    Comment: Performed at [facility], [place] [address], [place], [place] [address]  SARS Coronavirus 2 by RT PCR (hospital order, performed in [facility] hospital lab) Nasopharyngeal Nasopharyngeal  Swab     Status: None   Collection Time: 12/27/19  4:11 AM   Specimen: Nasopharyngeal Swab  Result Value Ref Range Status   SARS Coronavirus 2 NEGATIVE NEGATIVE Final    Comment: (NOTE) SARS-CoV-2 target nucleic acids are NOT DETECTED.  The SARS-CoV-2 RNA is generally detectable in upper and lower respiratory specimens during the acute phase of infection. The lowest concentration of SARS-CoV-2 viral copies this assay can detect is 250 copies / mL. A negative result does not preclude SARS-CoV-2 infection and should not be used as the sole basis for treatment or other patient management decisions.  A negative result may occur with improper specimen collection / handling, submission of specimen other than nasopharyngeal swab, presence of viral mutation(s) within the areas targeted by this assay, and inadequate number of viral copies (<250 copies / mL). A negative result must be combined with clinical observations, patient history, and epidemiological information.  Fact Sheet for Patients:   StrictlyIdeas.no  Fact Sheet for Healthcare Providers: BankingDealers.co.za  This test is not yet approved or  cleared by the [place] FDA and has been authorized for detection and/or diagnosis of SARS-CoV-2 by FDA under an Emergency Use Authorization (EUA).  This EUA will remain in effect (meaning this test can be used) for the duration of the COVID-19  declaration under Section 564(b)(1) of the Act, 21 U.S.C. section 360bbb-3(b)(1), unless the authorization is terminated or revoked sooner.  Performed at [facility], [place] [address] [place], [place] [address]   Urine Culture     Status: None   Collection Time: 12/27/19  8:34 AM   Specimen: Urine, Random  Result Value Ref Range Status   Specimen Description   Final    URINE, RANDOM Performed at [facility] [address] [place], [place] [address]    Special Requests   Final    URINE, RANDOM Performed at [facility] [address] [place], [place] [address]    Culture   Final    NO GROWTH Performed at [facility], [place] [address] [place], [place] [address]    Report Status 12/28/2019 FINAL  Final  Culture, blood (Routine X 2) w Reflex to ID Panel     Status: None (Preliminary result)   Collection Time: 12/27/19 10:57 AM   Specimen: BLOOD  Result Value Ref Range Status   Specimen Description   Final    BLOOD LEFT ANTECUBITAL Performed at [facility] [address] [place], [place] [address]    Special Requests   Final    BOTTLES DRAWN AEROBIC ONLY Blood Culture results may not be optimal due to an excessive volume of blood received in culture bottles Performed at [facility] [address], [place], [place] [address]    Culture   Final    NO GROWTH 3 DAYS Performed at [facility], [place] [address], [place], [place] [address]    Report Status PENDING  Incomplete  Culture, blood (Routine X 2) w Reflex to ID Panel     Status: None (Preliminary result)   Collection Time: 12/27/19 11:02 AM   Specimen: BLOOD  Result Value Ref Range Status   Specimen Description   Final    BLOOD RIGHT ANTECUBITAL Performed at [facility] [address] [place],  [address]    Special Requests   Final    BOTTLES DRAWN AEROBIC ONLY Blood Culture  adequate volume Performed  at [facility], [place] [address] [place], [place] [address]    Culture   Final    NO GROWTH 3 DAYS Performed at [facility], [place] [address] [place], [place] [address]    Report Status PENDING  Incomplete     Labs: Basic Metabolic Panel: Recent Labs  Lab 12/25/19 1353 12/27/19 0041 12/28/19 0502 12/30/19 0556 12/30/19 0826  NA 139 139 137  --  141  K 3.7 3.5 3.3* 3.7 3.7  CL 105 105 104  --  104  CO2 _0 --  25  GLUCOSE 121* 99 106*  --  94  BUN _1 --  15  CREATININE 1.28* 1.04 0.84  --  0.83  CALCIUM 9.3 9.2 8.9  --  9.3  MG  --  2.3  --   --  2.2   Liver Function Tests: Recent Labs  Lab 12/25/19 1353 12/27/19 0041 12/28/19 0502 12/29/19 0945 12/30/19 0826  AST 132* 59* _2 ALT 94* 92* 60* 47* 48*  ALKPHOS 108 83 70 77 78  BILITOT 0.7 0.5 0.4 0.4 0.4  PROT 7.5 7.5 6.5 6.0* 7.6  ALBUMIN 4.2 4.3 3.5 3.3* 4.3   Recent Labs  Lab 12/25/19 1353  LIPASE 36   No results for input(s): AMMONIA in the last 168 hours. CBC: Recent Labs  Lab 12/25/19 1353 12/27/19 0041 12/28/19 0502 12/29/19 0548 12/30/19 0556  WBC 16.9* 11.5* 10.7* 7.6 10.7*  HGB 16.9 15.5 13.7 13.7 14.6  HCT 48.1 44.0 39.0 38.5* 41.3  MCV 86.0 86.4 85.5 85.0 84.5  PLT 255 167 137* 153 186   Cardiac Enzymes: No results for input(s): CKTOTAL, CKMB, CKMBINDEX, TROPONINI in the last 168 hours. BNP: BNP (last 3 results) No results for input(s): BNP in the last 8760 hours.  ProBNP (last 3 results) No results for input(s): PROBNP in the last 8760 hours.  CBG: No results for input(s): GLUCAP in the last 168 hours.     Signed:  Irine Seal MD.  [facility] 12/30/2019, 4:49 PM

## 2019-12-30 NOTE — Anesthesia Preprocedure Evaluation (Addendum)
Anesthesia Evaluation  Patient identified by MRN, date of birth, ID band Patient awake    Reviewed: Allergy & Precautions, NPO status , Patient's Chart, lab work & pertinent test results  Airway Mallampati: I  TM Distance: >3 FB Neck ROM: Full    Dental  (+) Edentulous Upper, Edentulous Lower   Pulmonary Current Smoker,    breath sounds clear to auscultation       Cardiovascular hypertension,  Rhythm:Regular Rate:Normal     Neuro/Psych    GI/Hepatic negative GI ROS, Neg liver ROS,   Endo/Other  negative endocrine ROS  Renal/GU Renal disease     Musculoskeletal negative musculoskeletal ROS (+)   Abdominal Normal abdominal exam  (+)   Peds  Hematology negative hematology ROS (+)   Anesthesia Other Findings   Reproductive/Obstetrics                            Anesthesia Physical Anesthesia Plan  ASA: II  Anesthesia Plan: General   Post-op Pain Management:    Induction: Intravenous  PONV Risk Score and Plan: 2 and Ondansetron and Midazolam  Airway Management Planned: Oral ETT  Additional Equipment: None  Intra-op Plan:   Post-operative Plan:   Informed Consent: I have reviewed the patients History and Physical, chart, labs and discussed the procedure including the risks, benefits and alternatives for the proposed anesthesia with the patient or authorized representative who has indicated his/her understanding and acceptance.       Plan Discussed with: CRNA  Anesthesia Plan Comments:        Anesthesia Quick Evaluation

## 2019-12-30 NOTE — Discharge Instructions (Signed)

## 2019-12-30 NOTE — Anesthesia Procedure Notes (Signed)
Procedure Name: Intubation Date/Time: 12/30/2019 1:20 PM Performed by: Garrel Ridgel, CRNA Pre-anesthesia Checklist: Patient identified, Emergency Drugs available, Suction available and Patient being monitored Patient Re-evaluated:Patient Re-evaluated prior to induction Oxygen Delivery Method: Circle system utilized Preoxygenation: Pre-oxygenation with 100% oxygen Induction Type: IV induction Ventilation: Mask ventilation without difficulty Tube type: Oral Number of attempts: 1 Airway Equipment and Method: Stylet and Oral airway Placement Confirmation: ETT inserted through vocal cords under direct vision,  positive ETCO2 and breath sounds checked- equal and bilateral Secured at: 23 cm Tube secured with: Tape Dental Injury: Teeth and Oropharynx as per pre-operative assessment

## 2019-12-30 NOTE — Progress Notes (Signed)
Patient discharged home.  IV removed - WNL.  Reviewed AVS and medications, patient verbalized understanding.  Encouraged to quit smoking.  Emphasized importance of completing dose of antibiotics, no questions at this time.  Patient assisted off unit in NAD.

## 2019-12-31 LAB — CULTURE, BLOOD (ROUTINE X 2)
Culture: NO GROWTH
Special Requests: ADEQUATE

## 2020-01-01 ENCOUNTER — Encounter: Payer: Self-pay | Admitting: Family Medicine

## 2020-01-01 LAB — CULTURE, BLOOD (ROUTINE X 2)
Culture: NO GROWTH
Culture: NO GROWTH
Special Requests: ADEQUATE

## 2020-01-01 MED ORDER — OXYCODONE HCL 10 MG PO TABS: ORAL_TABLET | ORAL | 0 refills | Status: DC

## 2020-01-04 ENCOUNTER — Encounter: Payer: Self-pay | Admitting: Family Medicine

## 2020-01-04 ENCOUNTER — Telehealth (INDEPENDENT_AMBULATORY_CARE_PROVIDER_SITE_OTHER): Payer: BLUE CROSS/BLUE SHIELD | Admitting: Family Medicine

## 2020-01-04 VITALS — Ht 73.0 in

## 2020-01-04 DIAGNOSIS — I10 Essential (primary) hypertension: Secondary | ICD-10-CM | POA: Diagnosis not present

## 2020-01-04 DIAGNOSIS — G894 Chronic pain syndrome: Secondary | ICD-10-CM | POA: Diagnosis not present

## 2020-01-04 DIAGNOSIS — R7401 Elevation of levels of liver transaminase levels: Secondary | ICD-10-CM

## 2020-01-04 DIAGNOSIS — K861 Other chronic pancreatitis: Secondary | ICD-10-CM

## 2020-01-04 MED ORDER — XTAMPZA ER 9 MG PO C12A: 1.0000 | EXTENDED_RELEASE_CAPSULE | Freq: Two times a day (BID) | ORAL | 0 refills | Status: DC

## 2020-01-04 NOTE — Progress Notes (Signed)
Virtual Visit via Video Note I connected with Mr Denz on 01/04/20 by a video enabled telemedicine application and verified that I am speaking with the correct person using two identifiers.  Location patient: home Location provider:work office Persons participating in the virtual visit: patient, provider  I discussed the limitations of evaluation and management by telemedicine and the availability of in person appointments. The patient expressed understanding and agreed to proceed.  HPI: Mr Morken is a 44 yo male with hx of chronic pain,HTN,and depression following on recent hospitalization. He was admitted from 12/27/2019 to 12/30/2019. Discharge diagnosis: E. coli bacteremia. Initially patient was discharged home after ER evaluation but when blood culture was back positive for E. coli and Enterobacteriaceae he was instructed to go back to the hospital.  Lab Results  Component Value Date   CREATININE 0.83 12/30/2019   BUN 15 12/30/2019   NA 141 12/30/2019   K 3.7 12/30/2019   CL 104 12/30/2019   CO2 25 12/30/2019   Lab Results  Component Value Date   WBC 10.7 (H) 12/30/2019   HGB 14.6 12/30/2019   HCT 41.3 12/30/2019   MCV 84.5 12/30/2019   PLT 186 12/30/2019   Lab Results  Component Value Date   ALT 48 (H) 12/30/2019   AST 24 12/30/2019   ALKPHOS 78 12/30/2019   BILITOT 0.4 12/30/2019   ERCP done on 01/25/2020: Removal of calculi/debris is from biliary/pancreatic duct. Possible source of bacteremia most likely from biliary tree. Currently he is on Bactrim DS,  5th day.  Follow-up appointment with Dr. Henrene Pastor was recommended,he needs a referral.  Abdominal pain and nausea are back to his baseline. Negative for fever (last times 12/28/19),chills, dysphagia,chnages in bowel habits,blood in stool,melena,or urinary symptoms.  BP mildly elevated during ER evaluation. Currently he is on Metoprolol Succinate 100 mg daily and Amlodipine-Benazepril 5-20 mg daily. He checking BP at home:  120/80-90's. DBP's > 90 is not frequent. Negative for severe/frequent headache, chest pain, dyspnea, palpitation,focal weakness, or edema.  He also needs refills on Oxycodone ER 9 mg bid and Oxycodone 10 mg tid as needed. Tolerating medication well. He has Nasal Naloxone,he has not had to use.  ROS: See pertinent positives and negatives per HPI.  Past Medical History:  Diagnosis Date  . Chronic abdominal pain   . Diverticulitis   . Drug-seeking behavior   . Hypertension   . Kidney stones   . Pancreatitis, chronic ([place]) 05/28/2005    Past Surgical History:  Procedure Laterality Date  . APPENDECTOMY    . CHOLECYSTECTOMY    . ERCP N/A 12/30/2019   Procedure: ENDOSCOPIC RETROGRADE CHOLANGIOPANCREATOGRAPHY (ERCP);  Surgeon: Milus Banister, MD;  Location: [facility];  Service: Endoscopy;  Laterality: N/A;  . ERCP W/ METAL STENT PLACEMENT    . KIDNEY STONE SURGERY    . REMOVAL OF STONES  12/30/2019   Procedure: REMOVAL OF STONES;  Surgeon: Milus Banister, MD;  Location: [facility];  Service: Endoscopy;;    Family History  Problem Relation Age of Onset  . Cancer Mother   . Early death Mother   . Hypertension Mother   . Heart failure Father   . Hypertension Father   . Heart disease Father   . Hyperlipidemia Father   . Pancreatic cancer Paternal Grandmother        possibly  . Pancreatic cancer Paternal Aunt   . Colon cancer Neg Hx     Social History   Socioeconomic History  . Marital status: Married  Spouse name: Not on file  . Number of children: Not on file  . Years of education: Not on file  . Highest education level: Not on file  Occupational History  . Not on file  Tobacco Use  . Smoking status: Current Every Day Smoker    Packs/day: 0.50    Types: Cigarettes  . Smokeless tobacco: Never Used  Vaping Use  . Vaping Use: Never used  Substance and Sexual Activity  . Alcohol use: Yes    Comment: occ  . Drug use: No  . Sexual activity: Not on file  Other  Topics Concern  . Not on file  Social History Narrative  . Not on file   Social Determinants of Health   Financial Resource Strain:   . Difficulty of Paying Living Expenses:   Food Insecurity:   . Worried About Charity fundraiser in the Last Year:   . Arboriculturist in the Last Year:   Transportation Needs:   . Film/video editor (Medical):   Marland Kitchen Lack of Transportation (Non-Medical):   Physical Activity:   . Days of Exercise per Week:   . Minutes of Exercise per Session:   Stress:   . Feeling of Stress :   Social Connections:   . Frequency of Communication with Friends and Family:   . Frequency of Social Gatherings with Friends and Family:   . Attends Religious Services:   . Active Member of Clubs or Organizations:   . Attends Archivist Meetings:   Marland Kitchen Marital Status:   Intimate Partner Violence:   . Fear of Current or Ex-Partner:   . Emotionally Abused:   Marland Kitchen Physically Abused:   . Sexually Abused:     Current Outpatient Medications:  .  amLODipine-benazepril (LOTREL) 5-20 MG capsule, TAKE 1 CAPSULE BY MOUTH EVERY DAY, Disp: 90 capsule, Rfl: 1 .  fluticasone (FLONASE) 50 MCG/ACT nasal spray, Place 1 spray into both nostrils 2 (two) times daily., Disp: 16 g, Rfl: 2 .  metoprolol succinate (TOPROL-XL) 100 MG 24 hr tablet, Take 1 tablet (100 mg total) by mouth daily. Take with or immediately following a meal., Disp: 90 tablet, Rfl: 2 .  naloxone (NARCAN) nasal spray 4 mg/0.1 mL, 1 spray in each nostril x 1 if opioid overdose., Disp: 1 each, Rfl: 1 .  nicotine (NICODERM CQ - DOSED IN MG/24 HOURS) 14 mg/24hr patch, Place 1 patch (14 mg total) onto the skin daily., Disp: 28 patch, Rfl: 0 .  omeprazole (PRILOSEC) 20 MG capsule, Take 1 capsule (20 mg total) by mouth daily., Disp: 30 capsule, Rfl: 0 .  ondansetron (ZOFRAN ODT) 8 MG disintegrating tablet, Take 1 tablet (8 mg total) by mouth every 8 (eight) hours as needed for nausea or vomiting., Disp: 20 tablet, Rfl: 1 .   oxyCODONE ER (XTAMPZA ER) 9 MG C12A, Take 1 tablet by mouth 2 (two) times daily., Disp: 60 capsule, Rfl: 0 .  Oxycodone HCl 10 MG TABS, 1 tablet twice daily as needed, can take an extra tablet if needed.  No more than 70 tablets/month., Disp: 70 tablet, Rfl: 0 .  promethazine (PHENERGAN) 25 MG tablet, TAKE 1 TABLET (25 MG TOTAL) BY MOUTH EVERY 12 (TWELVE) HOURS AS NEEDED., Disp: 45 tablet, Rfl: 1  EXAM:  VITALS per patient if applicable:Ht 6\' 1"  (1.854 m)   BMI 27.18 kg/m   GENERAL: alert, oriented, appears well and in no acute distress  HEENT: atraumatic, conjunctiva clear, no obvious abnormalities on  inspection.  NECK: normal movements of the head and neck  LUNGS: on inspection no signs of respiratory distress, breathing rate appears normal, no obvious gross SOB, gasping or wheezing  CV: no obvious cyanosis  MS: moves all visible extremities without noticeable abnormality  PSYCH/NEURO: pleasant and cooperative, no obvious depression or anxiety, speech and thought processing grossly intact  ASSESSMENT AND PLAN:  Discussed the following assessment and plan:  Chronic biliary pancreatitis ([place]) - Plan: Ambulatory referral to Gastroenterology Continue following with GI. Referral placed.  Chronic pain disorder - Plan: oxyCODONE ER (XTAMPZA ER) 9 MG C12A Stable. No changes in current management. He understands side effects of meds and current guidelines in regard to chronic pain management with opioids.  Elevated transaminase level Improved when compared with prior results. Continue following with GI.  Hypertension, essential, benign BP adequately controlled. No changes in current management. Continue low salt diet.   Note for work from 12/25/19 to got back 01/04/20, he feels ready to go back to work today.  I discussed the assessment and treatment plan with the patient. Mr Lemen was provided an opportunity to ask questions and all were answered. He agreed with the plan and  demonstrated an understanding of the instructions.    Return in about 3 months (around 04/05/2020).   Liane Tribbey [place], MD

## 2020-01-12 ENCOUNTER — Telehealth: Payer: Self-pay | Admitting: Family Medicine

## 2020-01-12 NOTE — Telephone Encounter (Signed)
Please call Lanelle Bal a call about pt. [employer] thinks he will relapse and return to the hospital. She want to give Nursing Case management notes on pt.  Lanelle Bal [phone]

## 2020-01-13 NOTE — Telephone Encounter (Signed)
I left [name] a voicemail to return my call.

## 2020-01-18 NOTE — Telephone Encounter (Signed)
I spoke with Lanelle Bal, they just wanted to make [place] aware he had an inpatient stay & make sure he followed up with pcp.

## 2020-01-18 NOTE — Telephone Encounter (Signed)
I left Lanelle Bal a voicemail to return my call.

## 2020-01-22 ENCOUNTER — Other Ambulatory Visit: Payer: Self-pay | Admitting: Family Medicine

## 2020-01-22 DIAGNOSIS — I10 Essential (primary) hypertension: Secondary | ICD-10-CM

## 2020-01-25 ENCOUNTER — Other Ambulatory Visit: Payer: Self-pay | Admitting: Family Medicine

## 2020-01-25 DIAGNOSIS — G8929 Other chronic pain: Secondary | ICD-10-CM

## 2020-01-25 DIAGNOSIS — G894 Chronic pain syndrome: Secondary | ICD-10-CM

## 2020-01-25 NOTE — Telephone Encounter (Signed)
Message routed to PCP CMA  

## 2020-01-26 MED ORDER — OXYCODONE HCL 10 MG PO TABS: ORAL_TABLET | ORAL | 0 refills | Status: DC

## 2020-01-29 ENCOUNTER — Other Ambulatory Visit: Payer: Self-pay | Admitting: Family Medicine

## 2020-01-29 DIAGNOSIS — K861 Other chronic pancreatitis: Secondary | ICD-10-CM

## 2020-01-29 DIAGNOSIS — R112 Nausea with vomiting, unspecified: Secondary | ICD-10-CM

## 2020-02-04 ENCOUNTER — Other Ambulatory Visit: Payer: Self-pay | Admitting: Family Medicine

## 2020-02-04 DIAGNOSIS — G894 Chronic pain syndrome: Secondary | ICD-10-CM

## 2020-02-08 MED ORDER — XTAMPZA ER 9 MG PO C12A: 1.0000 | EXTENDED_RELEASE_CAPSULE | Freq: Two times a day (BID) | ORAL | 0 refills | Status: DC

## 2020-02-16 ENCOUNTER — Encounter (HOSPITAL_BASED_OUTPATIENT_CLINIC_OR_DEPARTMENT_OTHER): Payer: Self-pay | Admitting: Emergency Medicine

## 2020-02-16 ENCOUNTER — Emergency Department (HOSPITAL_BASED_OUTPATIENT_CLINIC_OR_DEPARTMENT_OTHER)
Admission: EM | Admit: 2020-02-16 | Discharge: 2020-02-16 | Disposition: A | Discharge status: Home / Self Care | Payer: BLUE CROSS/BLUE SHIELD | Attending: Emergency Medicine | Admitting: Emergency Medicine

## 2020-02-16 ENCOUNTER — Other Ambulatory Visit: Payer: Self-pay

## 2020-02-16 DIAGNOSIS — Z79899 Other long term (current) drug therapy: Secondary | ICD-10-CM | POA: Insufficient documentation

## 2020-02-16 DIAGNOSIS — R1013 Epigastric pain: Secondary | ICD-10-CM | POA: Diagnosis not present

## 2020-02-16 DIAGNOSIS — R112 Nausea with vomiting, unspecified: Secondary | ICD-10-CM | POA: Diagnosis not present

## 2020-02-16 DIAGNOSIS — I10 Essential (primary) hypertension: Secondary | ICD-10-CM | POA: Diagnosis not present

## 2020-02-16 DIAGNOSIS — F17210 Nicotine dependence, cigarettes, uncomplicated: Secondary | ICD-10-CM | POA: Insufficient documentation

## 2020-02-16 LAB — CBC WITH DIFFERENTIAL/PLATELET
Abs Immature Granulocytes: 0.1 10*3/uL — ABNORMAL HIGH (ref 0.00–0.07)
Basophils Absolute: 0.1 10*3/uL (ref 0.0–0.1)
Basophils Relative: 1 %
Eosinophils Absolute: 0.3 10*3/uL (ref 0.0–0.5)
Eosinophils Relative: 2 %
HCT: 47.8 % (ref 39.0–52.0)
Hemoglobin: 16.7 g/dL (ref 13.0–17.0)
Immature Granulocytes: 1 %
Lymphocytes Relative: 22 %
Lymphs Abs: 3.6 10*3/uL (ref 0.7–4.0)
MCH: 30.1 pg (ref 26.0–34.0)
MCHC: 34.9 g/dL (ref 30.0–36.0)
MCV: 86.1 fL (ref 80.0–100.0)
Monocytes Absolute: 1.4 10*3/uL — ABNORMAL HIGH (ref 0.1–1.0)
Monocytes Relative: 9 %
Neutro Abs: 10.6 10*3/uL — ABNORMAL HIGH (ref 1.7–7.7)
Neutrophils Relative %: 65 %
Platelets: 274 10*3/uL (ref 150–400)
RBC: 5.55 MIL/uL (ref 4.22–5.81)
RDW: 13 % (ref 11.5–15.5)
WBC: 16.1 10*3/uL — ABNORMAL HIGH (ref 4.0–10.5)
nRBC: 0 % (ref 0.0–0.2)

## 2020-02-16 LAB — COMPREHENSIVE METABOLIC PANEL
ALT: 24 U/L (ref 0–44)
AST: 24 U/L (ref 15–41)
Albumin: 4.5 g/dL (ref 3.5–5.0)
Alkaline Phosphatase: 61 U/L (ref 38–126)
Anion gap: 11 (ref 5–15)
BUN: 17 mg/dL (ref 6–20)
CO2: 24 mmol/L (ref 22–32)
Calcium: 9.7 mg/dL (ref 8.9–10.3)
Chloride: 104 mmol/L (ref 98–111)
Creatinine, Ser: 0.89 mg/dL (ref 0.61–1.24)
GFR calc Af Amer: >60 mL/min (ref >60)
GFR calc non Af Amer: >60 mL/min (ref >60)
Glucose, Bld: 118 mg/dL — ABNORMAL HIGH (ref 70–99)
Potassium: 3.5 mmol/L (ref 3.5–5.1)
Sodium: 139 mmol/L (ref 135–145)
Total Bilirubin: 0.8 mg/dL (ref 0.3–1.2)
Total Protein: 7.5 g/dL (ref 6.5–8.1)

## 2020-02-16 LAB — LIPASE, BLOOD: Lipase: 29 U/L (ref 11–51)

## 2020-02-16 MED ORDER — HYDROMORPHONE HCL 1 MG/ML IJ SOLN
1.0000 mg | Freq: Once | INTRAMUSCULAR | Status: AC
  Administered 2020-02-16: 1 mg via INTRAVENOUS
  Filled 2020-02-16: qty 1

## 2020-02-16 MED ORDER — PROMETHAZINE HCL 25 MG/ML IJ SOLN
12.5000 mg | Freq: Once | INTRAMUSCULAR | Status: AC
  Administered 2020-02-16: 12.5 mg via INTRAVENOUS
  Filled 2020-02-16: qty 1

## 2020-02-16 MED ORDER — HYDROMORPHONE HCL 1 MG/ML IJ SOLN
1.0000 mg | Freq: Once | INTRAMUSCULAR | Status: AC | PRN
  Administered 2020-02-16: 1 mg via INTRAVENOUS
  Filled 2020-02-16: qty 1

## 2020-02-16 MED ORDER — LACTATED RINGERS IV BOLUS
1000.0000 mL | Freq: Once | INTRAVENOUS | Status: AC
  Administered 2020-02-16: 1000 mL via INTRAVENOUS

## 2020-02-16 MED ORDER — PROMETHAZINE HCL 25 MG/ML IJ SOLN: 12.5000 mg | Freq: Once | INTRAMUSCULAR | Status: DC | PRN

## 2020-02-16 NOTE — ED Provider Notes (Signed)
[facility] Provider Note: Georgena Spurling, MD, FACEP  CSN: [id number] MRN: [id number] ARRIVAL: 02/16/20 at [place]: [place]  Abdominal Pain   HISTORY OF PRESENT ILLNESS  02/16/20 4:56 AM [name] is a 44 y.o. male on chronic narcotics for chronic abdominal pain related to pancreatitis.  He is here with epigastric abdominal pain radiating to his back that began about 6:30 PM yesterday evening.  He rates it as an 8 out of 10 and characterizes as having both sharp and burning components.  It is worse with movement or palpation.  He has had associated nausea and vomiting but no diarrhea.    Past Medical History:  Diagnosis Date  . Chronic abdominal pain   . Diverticulitis   . Drug-seeking behavior   . Hypertension   . Kidney stones   . Pancreatitis, chronic ([place]) 05/28/2005    Past Surgical History:  Procedure Laterality Date  . APPENDECTOMY    . CHOLECYSTECTOMY    . ERCP N/A 12/30/2019   Procedure: ENDOSCOPIC RETROGRADE CHOLANGIOPANCREATOGRAPHY (ERCP);  Surgeon: Milus Banister, MD;  Location: [facility];  Service: Endoscopy;  Laterality: N/A;  . ERCP W/ METAL STENT PLACEMENT    . KIDNEY STONE SURGERY    . REMOVAL OF STONES  12/30/2019   Procedure: REMOVAL OF STONES;  Surgeon: Milus Banister, MD;  Location: [facility];  Service: Endoscopy;;    Family History  Problem Relation Age of Onset  . Cancer Mother   . Early death Mother   . Hypertension Mother   . Heart failure Father   . Hypertension Father   . Heart disease Father   . Hyperlipidemia Father   . Pancreatic cancer Paternal Grandmother        possibly  . Pancreatic cancer Paternal Aunt   . Colon cancer Neg Hx     Social History   Tobacco Use  . Smoking status: Current Every Day Smoker    Packs/day: 0.50    Types: Cigarettes  . Smokeless tobacco: Never Used  Vaping Use  . Vaping Use: Never used  Substance Use Topics  . Alcohol use: Yes    Comment: rare    . Drug use: No    Prior to Admission medications   Medication Sig Start Date End Date Taking? Authorizing Provider  amLODipine-benazepril (LOTREL) 5-20 MG capsule TAKE 1 CAPSULE BY MOUTH EVERY DAY 11/19/19   [place], Betty G, MD  fluticasone [facility]) 50 MCG/ACT nasal spray Place 1 spray into both nostrils 2 (two) times daily. 04/03/19   [place], Betty G, MD  metoprolol succinate (TOPROL-XL) 100 MG 24 hr tablet TAKE 1 TABLET (100 MG TOTAL) BY MOUTH DAILY. TAKE WITH OR IMMEDIATELY FOLLOWING A MEAL. 01/22/20   [place], Betty G, MD  naloxone [facility]) nasal spray 4 mg/0.1 mL 1 spray in each nostril x 1 if opioid overdose. 03/23/19   [place], Betty G, MD  nicotine (NICODERM CQ - DOSED IN MG/24 HOURS) 14 mg/24hr patch Place 1 patch (14 mg total) onto the skin daily. 12/31/19   Eugenie Filler, MD  omeprazole (PRILOSEC) 20 MG capsule Take 1 capsule (20 mg total) by mouth daily. 03/23/19   Horton, Barbette Hair, MD  ondansetron (ZOFRAN ODT) 8 MG disintegrating tablet Take 1 tablet (8 mg total) by mouth every 8 (eight) hours as needed for nausea or vomiting. 06/24/19   [place], Betty G, MD  oxyCODONE ER [facility]) 9 MG C12A Take 1 tablet by mouth 2 (  two) times daily. 02/08/20   [place], Betty G, MD  Oxycodone HCl 10 MG TABS 1 tablet twice daily as needed, can take an extra tablet if needed.  No more than 70 tablets/month. 01/26/20   [place], Betty G, MD  promethazine (PHENERGAN) 25 MG tablet TAKE 1 TABLET (25 MG TOTAL) BY MOUTH EVERY 12 (TWELVE) HOURS AS NEEDED. 01/29/20   [place], Betty G, MD    Allergies Cortisone, Eggs or egg-derived products, Ketorolac, Ketorolac tromethamine, Haldol [haloperidol lactate], Haloperidol, Hydrocortisone, Iodinated diagnostic agents, Ketorolac tromethamine, and Reglan [metoclopramide]   REVIEW OF SYSTEMS  Negative except as noted here or in the History of Present Illness.   PHYSICAL EXAMINATION  Initial Vital Signs Blood pressure (!) 152/105, pulse (!) 108, temperature 98.1 F  (36.7 C), temperature source Oral, resp. rate 18, height 6\' 1"  (1.854 m), weight 93.4 kg, SpO2 97 %.  Examination General: Well-developed, well-nourished male in no acute distress; appearance consistent with age of record HENT: normocephalic; atraumatic Eyes: pupils equal, round and reactive to light; extraocular muscles intact Neck: supple Heart: regular rate and rhythm Lungs: clear to auscultation bilaterally Abdomen: soft; nondistended; epigastric tenderness; bowel sounds present Extremities: No deformity; full range of motion; pulses normal Neurologic: Awake, alert and oriented; motor function intact in all extremities and symmetric; no facial droop Skin: Warm and dry Psychiatric: Normal mood and affect   RESULTS  Summary of this visit's results, reviewed and interpreted by myself:   EKG Interpretation  Date/Time:    Ventricular Rate:    PR Interval:    QRS Duration:   QT Interval:    QTC Calculation:   R Axis:     Text Interpretation:        Laboratory Studies: Results for orders placed or performed during the hospital encounter of 02/16/20 (from the past 24 hour(s))  CBC with Differential/Platelet     Status: Abnormal   Collection Time: 02/16/20  5:10 AM  Result Value Ref Range   WBC 16.1 (H) 4.0 - 10.5 K/uL   RBC 5.55 4.22 - 5.81 MIL/uL   Hemoglobin 16.7 13.0 - 17.0 g/dL   HCT 47.8 39 - 52 %   MCV 86.1 80.0 - 100.0 fL   MCH 30.1 26.0 - 34.0 pg   MCHC 34.9 30.0 - 36.0 g/dL   RDW 13.0 11.5 - 15.5 %   Platelets 274 150 - 400 K/uL   nRBC 0.0 0.0 - 0.2 %   Neutrophils Relative % 65 %   Neutro Abs 10.6 (H) 1.7 - 7.7 K/uL   Lymphocytes Relative 22 %   Lymphs Abs 3.6 0.7 - 4.0 K/uL   Monocytes Relative 9 %   Monocytes Absolute 1.4 (H) 0 - 1 K/uL   Eosinophils Relative 2 %   Eosinophils Absolute 0.3 0 - 0 K/uL   Basophils Relative 1 %   Basophils Absolute 0.1 0 - 0 K/uL   Immature Granulocytes 1 %   Abs Immature Granulocytes 0.10 (H) 0.00 - 0.07 K/uL    Comprehensive metabolic panel     Status: Abnormal   Collection Time: 02/16/20  5:10 AM  Result Value Ref Range   Sodium 139 135 - 145 mmol/L   Potassium 3.5 3.5 - 5.1 mmol/L   Chloride 104 98 - 111 mmol/L   CO2 24 22 - 32 mmol/L   Glucose, Bld 118 (H) 70 - 99 mg/dL   BUN 17 6 - 20 mg/dL   Creatinine, Ser 0.89 0.61 - 1.24 mg/dL   Calcium  9.7 8.9 - 10.3 mg/dL   Total Protein 7.5 6.5 - 8.1 g/dL   Albumin 4.5 3.5 - 5.0 g/dL   AST 24 15 - 41 U/L   ALT 24 0 - 44 U/L   Alkaline Phosphatase 61 38 - 126 U/L   Total Bilirubin 0.8 0.3 - 1.2 mg/dL   GFR calc non Af Amer >60 >60 mL/min   GFR calc Af Amer >60 >60 mL/min   Anion gap 11 5 - 15  Lipase, blood     Status: None   Collection Time: 02/16/20  5:10 AM  Result Value Ref Range   Lipase 29 11 - 51 U/L   Imaging Studies: No results found.  ED COURSE and MDM  Nursing notes, initial and subsequent vitals signs, including pulse oximetry, reviewed and interpreted by myself.  Vitals:   02/16/20 0448 02/16/20 0451  BP:  (!) 152/105  Pulse:  (!) 108  Resp:  18  Temp:  98.1 F (36.7 C)  TempSrc:  Oral  SpO2:  97%  Weight: 93.4 kg   Height: 6\' 1"  (1.854 m)    Medications  promethazine (PHENERGAN) injection 12.5 mg (has no administration in time range)  HYDROmorphone (DILAUDID) injection 1 mg (has no administration in time range)  lactated ringers bolus 1,000 mL ( Intravenous Stopped 02/16/20 0619)  promethazine (PHENERGAN) injection 12.5 mg (12.5 mg Intravenous Given 02/16/20 0519)  HYDROmorphone (DILAUDID) injection 1 mg (1 mg Intravenous Given 02/16/20 0523)   6:20 AM Patient feeling better after IV fluid bolus and medications.  States he is ready to go home.   PROCEDURES  Procedures   ED DIAGNOSES     ICD-10-CM   1. Epigastric pain  R10.13   2. Nausea and vomiting in adult  R11.2        Shanon Rosser, MD 02/16/20 [phone]

## 2020-02-16 NOTE — ED Triage Notes (Signed)
Pt is c/o abd pain with nausea and vomiting  Pt states it started Monday evening around 1830  Pt states the pain is in his upper abdomen and radiates straight back

## 2020-02-17 ENCOUNTER — Encounter: Payer: Self-pay | Admitting: Family Medicine

## 2020-02-19 ENCOUNTER — Other Ambulatory Visit: Payer: Self-pay

## 2020-02-19 ENCOUNTER — Encounter (HOSPITAL_BASED_OUTPATIENT_CLINIC_OR_DEPARTMENT_OTHER): Payer: Self-pay | Admitting: Emergency Medicine

## 2020-02-19 ENCOUNTER — Emergency Department (HOSPITAL_BASED_OUTPATIENT_CLINIC_OR_DEPARTMENT_OTHER)
Admission: EM | Admit: 2020-02-19 | Discharge: 2020-02-19 | Disposition: A | Discharge status: Home / Self Care | Payer: BLUE CROSS/BLUE SHIELD | Attending: Emergency Medicine | Admitting: Emergency Medicine

## 2020-02-19 DIAGNOSIS — R1013 Epigastric pain: Secondary | ICD-10-CM | POA: Diagnosis not present

## 2020-02-19 DIAGNOSIS — K861 Other chronic pancreatitis: Secondary | ICD-10-CM

## 2020-02-19 DIAGNOSIS — R112 Nausea with vomiting, unspecified: Secondary | ICD-10-CM | POA: Diagnosis not present

## 2020-02-19 DIAGNOSIS — I10 Essential (primary) hypertension: Secondary | ICD-10-CM | POA: Insufficient documentation

## 2020-02-19 DIAGNOSIS — F17210 Nicotine dependence, cigarettes, uncomplicated: Secondary | ICD-10-CM | POA: Diagnosis not present

## 2020-02-19 LAB — COMPREHENSIVE METABOLIC PANEL
ALT: 23 U/L (ref 0–44)
AST: 20 U/L (ref 15–41)
Albumin: 4.2 g/dL (ref 3.5–5.0)
Alkaline Phosphatase: 64 U/L (ref 38–126)
Anion gap: 13 (ref 5–15)
BUN: 15 mg/dL (ref 6–20)
CO2: 23 mmol/L (ref 22–32)
Calcium: 9.3 mg/dL (ref 8.9–10.3)
Chloride: 105 mmol/L (ref 98–111)
Creatinine, Ser: 0.95 mg/dL (ref 0.61–1.24)
GFR calc Af Amer: >60 mL/min (ref >60)
GFR calc non Af Amer: >60 mL/min (ref >60)
Glucose, Bld: 103 mg/dL — ABNORMAL HIGH (ref 70–99)
Potassium: 3.7 mmol/L (ref 3.5–5.1)
Sodium: 141 mmol/L (ref 135–145)
Total Bilirubin: 0.5 mg/dL (ref 0.3–1.2)
Total Protein: 7.2 g/dL (ref 6.5–8.1)

## 2020-02-19 LAB — CBC WITH DIFFERENTIAL/PLATELET
Abs Immature Granulocytes: 0.08 10*3/uL — ABNORMAL HIGH (ref 0.00–0.07)
Basophils Absolute: 0.1 10*3/uL (ref 0.0–0.1)
Basophils Relative: 1 %
Eosinophils Absolute: 0.3 10*3/uL (ref 0.0–0.5)
Eosinophils Relative: 3 %
HCT: 45.5 % (ref 39.0–52.0)
Hemoglobin: 15.7 g/dL (ref 13.0–17.0)
Immature Granulocytes: 1 %
Lymphocytes Relative: 31 %
Lymphs Abs: 4 10*3/uL (ref 0.7–4.0)
MCH: 30 pg (ref 26.0–34.0)
MCHC: 34.5 g/dL (ref 30.0–36.0)
MCV: 86.8 fL (ref 80.0–100.0)
Monocytes Absolute: 1.4 10*3/uL — ABNORMAL HIGH (ref 0.1–1.0)
Monocytes Relative: 11 %
Neutro Abs: 7.1 10*3/uL (ref 1.7–7.7)
Neutrophils Relative %: 53 %
Platelets: 272 10*3/uL (ref 150–400)
RBC: 5.24 MIL/uL (ref 4.22–5.81)
RDW: 13.1 % (ref 11.5–15.5)
WBC: 13 10*3/uL — ABNORMAL HIGH (ref 4.0–10.5)
nRBC: 0 % (ref 0.0–0.2)

## 2020-02-19 LAB — LIPASE, BLOOD: Lipase: 125 U/L — ABNORMAL HIGH (ref 11–51)

## 2020-02-19 MED ORDER — OXYCODONE HCL 5 MG PO TABS
5.0000 mg | ORAL_TABLET | ORAL | Status: AC
  Administered 2020-02-19: 5 mg via ORAL
  Filled 2020-02-19: qty 1

## 2020-02-19 MED ORDER — ONDANSETRON 4 MG PO TBDP: ORAL_TABLET | ORAL | 0 refills | Status: DC

## 2020-02-19 MED ORDER — FENTANYL CITRATE (PF) 100 MCG/2ML IJ SOLN
50.0000 ug | Freq: Once | INTRAMUSCULAR | Status: AC
  Administered 2020-02-19: 50 ug via INTRAVENOUS
  Filled 2020-02-19: qty 2

## 2020-02-19 MED ORDER — DROPERIDOL 2.5 MG/ML IJ SOLN
1.2500 mg | Freq: Once | INTRAMUSCULAR | Status: DC
  Filled 2020-02-19: qty 2

## 2020-02-19 MED ORDER — SODIUM CHLORIDE 0.9 % IV BOLUS
500.0000 mL | Freq: Once | INTRAVENOUS | Status: AC
  Administered 2020-02-19: 500 mL via INTRAVENOUS

## 2020-02-19 MED ORDER — ONDANSETRON HCL 4 MG/2ML IJ SOLN
4.0000 mg | Freq: Once | INTRAMUSCULAR | Status: AC
  Administered 2020-02-19: 4 mg via INTRAVENOUS
  Filled 2020-02-19: qty 2

## 2020-02-19 NOTE — ED Triage Notes (Signed)
Patient presents with complaints of LUQ pain radiating into back with NV; states emesis 5-6 times; seen here 9/21 for same.

## 2020-02-19 NOTE — ED Provider Notes (Signed)
[facility] EMERGENCY DEPARTMENT Provider Note   CSN: [id number] Arrival date & time: 02/19/20  0402     History Chief Complaint  Patient presents with  . Abdominal Pain    [name] is a 44 y.o. male.  The history is provided by the patient.  Abdominal Pain Pain location:  Epigastric Pain quality: stabbing   Pain radiation: back  Pain severity:  Severe Onset quality:  Gradual Duration:  6 days Timing:  Constant Progression:  Unchanged Chronicity:  Chronic Context: not alcohol use and not suspicious food intake   Relieved by:  Nothing Worsened by:  Nothing Ineffective treatments:  None tried Associated symptoms: nausea and vomiting   Associated symptoms: no chest pain, no constipation, no cough, no diarrhea, no dysuria, no fever and no melena   Risk factors: no alcohol abuse   Patient with a h/o chronic abdominal pain presents with ongoing abdominal pain and emesis. Patient was seen 02/16/20 for this illness and was given pain medication and anti-emetics.  States he was discharged to home and is unable to keep down his home narcotic pain medication and phenergan.  He states he called the doctor's office but his doctor isn't in this week.  No f/c/r.  No atypical features to the pain.       Past Medical History:  Diagnosis Date  . Chronic abdominal pain   . Diverticulitis   . Drug-seeking behavior   . Hypertension   . Kidney stones   . Pancreatitis, chronic ([place]) 05/28/2005    Patient Active Problem List   Diagnosis Date Noted  . Abnormal magnetic resonance cholangiopancreatography (MRCP)   . Choledocholithiasis   . E coli bacteremia   . Bacteremia due to Enterobacter species   . Bacteremia 12/27/2019  . QT prolongation 12/27/2019  . Nausea without vomiting   . Chronic pain disorder 08/29/2018  . Back pain, chronic 08/29/2018  . Nausea and vomiting in adult 02/25/2018  . Hypertension, essential, benign 01/18/2018  . Chronic biliary  pancreatitis ([place]) 01/18/2018  . Chronic abdominal pain 01/14/2018    Past Surgical History:  Procedure Laterality Date  . APPENDECTOMY    . CHOLECYSTECTOMY    . ERCP N/A 12/30/2019   Procedure: ENDOSCOPIC RETROGRADE CHOLANGIOPANCREATOGRAPHY (ERCP);  Surgeon: Milus Banister, MD;  Location: [facility];  Service: Endoscopy;  Laterality: N/A;  . ERCP W/ METAL STENT PLACEMENT    . KIDNEY STONE SURGERY    . REMOVAL OF STONES  12/30/2019   Procedure: REMOVAL OF STONES;  Surgeon: Milus Banister, MD;  Location: [facility];  Service: Endoscopy;;       Family History  Problem Relation Age of Onset  . Cancer Mother   . Early death Mother   . Hypertension Mother   . Heart failure Father   . Hypertension Father   . Heart disease Father   . Hyperlipidemia Father   . Pancreatic cancer Paternal Grandmother        possibly  . Pancreatic cancer Paternal Aunt   . Colon cancer Neg Hx     Social History   Tobacco Use  . Smoking status: Current Every Day Smoker    Packs/day: 0.50    Types: Cigarettes  . Smokeless tobacco: Never Used  Vaping Use  . Vaping Use: Never used  Substance Use Topics  . Alcohol use: Yes    Comment: rare  . Drug use: No    Home Medications Prior to Admission medications   Medication Sig Start  Date End Date Taking? Authorizing Provider  amLODipine-benazepril (LOTREL) 5-20 MG capsule TAKE 1 CAPSULE BY MOUTH EVERY DAY 11/19/19   [place], Betty G, MD  fluticasone [facility]) 50 MCG/ACT nasal spray Place 1 spray into both nostrils 2 (two) times daily. 04/03/19   [place], Betty G, MD  metoprolol succinate (TOPROL-XL) 100 MG 24 hr tablet TAKE 1 TABLET (100 MG TOTAL) BY MOUTH DAILY. TAKE WITH OR IMMEDIATELY FOLLOWING A MEAL. 01/22/20   [place], Betty G, MD  naloxone [facility]) nasal spray 4 mg/0.1 mL 1 spray in each nostril x 1 if opioid overdose. 03/23/19   [place], Betty G, MD  nicotine (NICODERM CQ - DOSED IN MG/24 HOURS) 14 mg/24hr patch Place 1 patch (14 mg total) onto  the skin daily. 12/31/19   Eugenie Filler, MD  omeprazole (PRILOSEC) 20 MG capsule Take 1 capsule (20 mg total) by mouth daily. 03/23/19   Horton, Barbette Hair, MD  ondansetron (ZOFRAN ODT) 8 MG disintegrating tablet Take 1 tablet (8 mg total) by mouth every 8 (eight) hours as needed for nausea or vomiting. 06/24/19   [place], Betty G, MD  oxyCODONE ER [facility]) 9 MG C12A Take 1 tablet by mouth 2 (two) times daily. 02/08/20   [place], Betty G, MD  Oxycodone HCl 10 MG TABS 1 tablet twice daily as needed, can take an extra tablet if needed.  No more than 70 tablets/month. 01/26/20   [place], Betty G, MD  promethazine (PHENERGAN) 25 MG tablet TAKE 1 TABLET (25 MG TOTAL) BY MOUTH EVERY 12 (TWELVE) HOURS AS NEEDED. 01/29/20   [place], Betty G, MD    Allergies    Cortisone, Eggs or egg-derived products, Ketorolac, Ketorolac tromethamine, Haldol [haloperidol lactate], Haloperidol, Hydrocortisone, Iodinated diagnostic agents, Ketorolac tromethamine, and Reglan [metoclopramide]  Review of Systems   Review of Systems  Constitutional: Negative for fever.  HENT: Negative for congestion.   Eyes: Negative for visual disturbance.  Respiratory: Negative for cough.   Cardiovascular: Negative for chest pain.  Gastrointestinal: Positive for abdominal pain, nausea and vomiting. Negative for constipation, diarrhea and melena.  Genitourinary: Negative for dysuria.  Musculoskeletal: Negative for arthralgias.  Skin: Negative for color change.  Neurological: Negative for dizziness.  Psychiatric/Behavioral: Negative for agitation.  All other systems reviewed and are negative.   Physical Exam Updated Vital Signs BP (!) 138/95 (BP Location: Right Arm)   Pulse 74   Temp 98.4 F (36.9 C) (Oral)   Resp 11   Ht 6\' 1"  (1.854 m)   Wt 93 kg   SpO2 94%   BMI 27.05 kg/m   Physical Exam Vitals and nursing note reviewed.  Constitutional:      General: He is not in acute distress.    Appearance: Normal appearance.    HENT:     Head: Normocephalic and atraumatic.     Nose: Nose normal.  Eyes:     Conjunctiva/sclera: Conjunctivae normal.     Pupils: Pupils are equal, round, and reactive to light.  Cardiovascular:     Rate and Rhythm: Normal rate and regular rhythm.     Pulses: Normal pulses.     Heart sounds: Normal heart sounds.  Pulmonary:     Effort: Pulmonary effort is normal.     Breath sounds: Normal breath sounds.  Abdominal:     General: Abdomen is flat. Bowel sounds are normal.     Palpations: Abdomen is soft.     Tenderness: There is no abdominal tenderness. There is no guarding or rebound.  Hernia: No hernia is present.  Musculoskeletal:        General: Normal range of motion.     Cervical back: Normal range of motion and neck supple.  Skin:    General: Skin is warm and dry.     Capillary Refill: Capillary refill takes less than 2 seconds.  Neurological:     General: No focal deficit present.     Mental Status: He is alert and oriented to person, place, and time.     Deep Tendon Reflexes: Reflexes normal.  Psychiatric:        Mood and Affect: Mood normal.        Behavior: Behavior normal.     ED Results / Procedures / Treatments   Labs (all labs ordered are listed, but only abnormal results are displayed) Results for orders placed or performed during the hospital encounter of 02/19/20  CBC with Differential/Platelet  Result Value Ref Range   WBC 13.0 (H) 4.0 - 10.5 K/uL   RBC 5.24 4.22 - 5.81 MIL/uL   Hemoglobin 15.7 13.0 - 17.0 g/dL   HCT 45.5 39 - 52 %   MCV 86.8 80.0 - 100.0 fL   MCH 30.0 26.0 - 34.0 pg   MCHC 34.5 30.0 - 36.0 g/dL   RDW 13.1 11.5 - 15.5 %   Platelets 272 150 - 400 K/uL   nRBC 0.0 0.0 - 0.2 %   Neutrophils Relative % 53 %   Neutro Abs 7.1 1.7 - 7.7 K/uL   Lymphocytes Relative 31 %   Lymphs Abs 4.0 0.7 - 4.0 K/uL   Monocytes Relative 11 %   Monocytes Absolute 1.4 (H) 0 - 1 K/uL   Eosinophils Relative 3 %   Eosinophils Absolute 0.3 0 - 0  K/uL   Basophils Relative 1 %   Basophils Absolute 0.1 0 - 0 K/uL   Immature Granulocytes 1 %   Abs Immature Granulocytes 0.08 (H) 0.00 - 0.07 K/uL  Lipase, blood  Result Value Ref Range   Lipase 125 (H) 11 - 51 U/L  Comprehensive metabolic panel  Result Value Ref Range   Sodium 141 135 - 145 mmol/L   Potassium 3.7 3.5 - 5.1 mmol/L   Chloride 105 98 - 111 mmol/L   CO2 23 22 - 32 mmol/L   Glucose, Bld 103 (H) 70 - 99 mg/dL   BUN 15 6 - 20 mg/dL   Creatinine, Ser 0.95 0.61 - 1.24 mg/dL   Calcium 9.3 8.9 - 10.3 mg/dL   Total Protein 7.2 6.5 - 8.1 g/dL   Albumin 4.2 3.5 - 5.0 g/dL   AST 20 15 - 41 U/L   ALT 23 0 - 44 U/L   Alkaline Phosphatase 64 38 - 126 U/L   Total Bilirubin 0.5 0.3 - 1.2 mg/dL   GFR calc non Af Amer >60 >60 mL/min   GFR calc Af Amer >60 >60 mL/min   Anion gap 13 5 - 15   No results found.  Radiology No results found.  Procedures Procedures (including critical care time)  Medications Ordered in ED Medications  droperidol (INAPSINE) 2.5 MG/ML injection 1.25 mg (1.25 mg Intravenous Refused 02/19/20 0437)  ondansetron (ZOFRAN) injection 4 mg (has no administration in time range)  oxyCODONE (Oxy IR/ROXICODONE) immediate release tablet 5 mg (has no administration in time range)  fentaNYL (SUBLIMAZE) injection 50 mcg (50 mcg Intravenous Given 02/19/20 0440)  sodium chloride 0.9 % bolus 500 mL (500 mLs Intravenous New Bag/Given 02/19/20 0436)  ED Course  I have reviewed the triage vital signs and the nursing notes.  Pertinent labs & imaging results that were available during my care of the patient were reviewed by me and considered in my medical decision making (see chart for details).   Patient asked which doctor was on this evening.  This is concerning for drug seeking behavior.     I ordered droperidol for nausea and patient refused this medication. Patient was given zofran and fentanyl.  There are no signs of sepsis.  I do not see any signs that patient  needs to be admitted.  Patient appears very comfortable.  I will discharge with zofran ODT.  He can continue his home narcotic regimen.   Leda Gauze was evaluated in Emergency Department on 02/19/2020 for the symptoms described in the history of present illness. He was evaluated in the context of the global COVID-19 pandemic, which necessitated consideration that the patient might be at risk for infection with the SARS-CoV-2 virus that causes COVID-19. Institutional protocols and algorithms that pertain to the evaluation of patients at risk for COVID-19 are in a state of rapid change based on information released by regulatory bodies including the CDC and federal and state organizations. These policies and algorithms were followed during the patient's care in the ED.  Final Clinical Impression(s) / ED Diagnoses Return for intractable cough, coughing up blood,fevers >100.4 unrelieved by medication, shortness of breath, intractable vomiting, chest pain, shortness of breath, weakness,numbness, changes in speech, facial asymmetry,abdominal pain, passing out,Inability to tolerate liquids or food, cough, altered mental status or any concerns. No signs of systemic illness or infection. The patient is nontoxic-appearing on exam and vital signs are within normal limits.   I have reviewed the triage vital signs and the nursing notes. Pertinent labs &imaging results that were available during my care of the patient were reviewed by me and considered in my medical decision making (see chart for details).After history, exam, and medical workup I feel the patient has beenappropriately medically screened and is safe for discharge home. Pertinent diagnoses were discussed with the patient. Patient was given return precautions.   Farris Geiman, MD 02/19/20 [phone]

## 2020-02-24 ENCOUNTER — Other Ambulatory Visit: Payer: Self-pay | Admitting: Family Medicine

## 2020-02-24 DIAGNOSIS — G894 Chronic pain syndrome: Secondary | ICD-10-CM

## 2020-02-24 DIAGNOSIS — G8929 Other chronic pain: Secondary | ICD-10-CM

## 2020-02-25 ENCOUNTER — Other Ambulatory Visit: Payer: Self-pay

## 2020-02-25 ENCOUNTER — Encounter (HOSPITAL_BASED_OUTPATIENT_CLINIC_OR_DEPARTMENT_OTHER): Payer: Self-pay | Admitting: Emergency Medicine

## 2020-02-25 ENCOUNTER — Emergency Department (HOSPITAL_BASED_OUTPATIENT_CLINIC_OR_DEPARTMENT_OTHER)
Admission: EM | Admit: 2020-02-25 | Discharge: 2020-02-25 | Disposition: A | Discharge status: Home / Self Care | Payer: BLUE CROSS/BLUE SHIELD | Attending: Emergency Medicine | Admitting: Emergency Medicine

## 2020-02-25 DIAGNOSIS — I10 Essential (primary) hypertension: Secondary | ICD-10-CM | POA: Insufficient documentation

## 2020-02-25 DIAGNOSIS — F17210 Nicotine dependence, cigarettes, uncomplicated: Secondary | ICD-10-CM | POA: Insufficient documentation

## 2020-02-25 DIAGNOSIS — R1013 Epigastric pain: Secondary | ICD-10-CM | POA: Diagnosis not present

## 2020-02-25 LAB — CBC
HCT: 46.5 % (ref 39.0–52.0)
Hemoglobin: 16.2 g/dL (ref 13.0–17.0)
MCH: 29.9 pg (ref 26.0–34.0)
MCHC: 34.8 g/dL (ref 30.0–36.0)
MCV: 86 fL (ref 80.0–100.0)
Platelets: 220 10*3/uL (ref 150–400)
RBC: 5.41 MIL/uL (ref 4.22–5.81)
RDW: 12.9 % (ref 11.5–15.5)
WBC: 17.5 10*3/uL — ABNORMAL HIGH (ref 4.0–10.5)
nRBC: 0 % (ref 0.0–0.2)

## 2020-02-25 LAB — COMPREHENSIVE METABOLIC PANEL
ALT: 22 U/L (ref 0–44)
AST: 19 U/L (ref 15–41)
Albumin: 4.1 g/dL (ref 3.5–5.0)
Alkaline Phosphatase: 57 U/L (ref 38–126)
Anion gap: 12 (ref 5–15)
BUN: 26 mg/dL — ABNORMAL HIGH (ref 6–20)
CO2: 22 mmol/L (ref 22–32)
Calcium: 10 mg/dL (ref 8.9–10.3)
Chloride: 104 mmol/L (ref 98–111)
Creatinine, Ser: 0.83 mg/dL (ref 0.61–1.24)
GFR calc Af Amer: >60 mL/min (ref >60)
GFR calc non Af Amer: >60 mL/min (ref >60)
Glucose, Bld: 103 mg/dL — ABNORMAL HIGH (ref 70–99)
Potassium: 4.2 mmol/L (ref 3.5–5.1)
Sodium: 138 mmol/L (ref 135–145)
Total Bilirubin: 0.5 mg/dL (ref 0.3–1.2)
Total Protein: 7.5 g/dL (ref 6.5–8.1)

## 2020-02-25 LAB — LIPASE, BLOOD: Lipase: 30 U/L (ref 11–51)

## 2020-02-25 MED ORDER — OXYCODONE HCL 5 MG PO TABS
10.0000 mg | ORAL_TABLET | Freq: Once | ORAL | Status: AC
  Administered 2020-02-25: 10 mg via ORAL
  Filled 2020-02-25: qty 2

## 2020-02-25 MED ORDER — ONDANSETRON HCL 4 MG/2ML IJ SOLN
4.0000 mg | Freq: Once | INTRAMUSCULAR | Status: AC
  Administered 2020-02-25: 4 mg via INTRAVENOUS
  Filled 2020-02-25: qty 2

## 2020-02-25 MED ORDER — FAMOTIDINE IN NACL 20-0.9 MG/50ML-% IV SOLN
20.0000 mg | Freq: Once | INTRAVENOUS | Status: AC
  Administered 2020-02-25: 20 mg via INTRAVENOUS
  Filled 2020-02-25: qty 50

## 2020-02-25 MED ORDER — HYDROMORPHONE HCL 1 MG/ML IJ SOLN
1.0000 mg | Freq: Once | INTRAMUSCULAR | Status: AC
  Administered 2020-02-25: 1 mg via INTRAVENOUS
  Filled 2020-02-25: qty 1

## 2020-02-25 MED ORDER — SODIUM CHLORIDE 0.9 % IV BOLUS
1000.0000 mL | Freq: Once | INTRAVENOUS | Status: AC
  Administered 2020-02-25: 1000 mL via INTRAVENOUS

## 2020-02-25 NOTE — Discharge Instructions (Addendum)
You were evaluated in the Emergency Department and after careful evaluation, we did not find any emergent condition requiring admission or further testing in the hospital.  Your exam/testing today was overall reassuring.  We recommend follow-up with your regular doctor to discuss your continued abdominal pain.  Please return to the Emergency Department if you experience any worsening of your condition.  Thank you for allowing [place] to be a part of your care.

## 2020-02-25 NOTE — ED Triage Notes (Signed)
Patient presents with complaints of luq pain with NV; seen here for same 9/21 and 9/24; states pain onset 2 days ago; states was unable to see PCP this week for follow up.

## 2020-02-25 NOTE — ED Provider Notes (Signed)
[facility] Emergency Department Provider Note MRN:  [id number]  Arrival date & time: 02/25/20     Chief Complaint   Abdominal Pain   History of Present Illness   [name] is a 44 y.o. year-old male with a history of chronic abdominal pain presenting to the ED with chief complaint of abdominal pain.  Location: Epigastrium radiating to the back Duration: 15 years but worse over the past 6 days Onset: Gradual Timing: Constant Description: Sharp and burning Severity: Moderate to severe Exacerbating/Alleviating Factors: Sometimes worse with meals Associated Symptoms: Nausea and vomiting, trouble taking her medications Pertinent Negatives: Denies fever, no blood in the vomit, no blood in the stool, no diarrhea, no constipation, no lower abdominal pain, no chest pain or shortness of breath   Review of Systems  A complete 10 system review of systems was obtained and all systems are negative except as noted in the HPI and PMH.   Patient's Health History    Past Medical History:  Diagnosis Date  . Chronic abdominal pain   . Diverticulitis   . Drug-seeking behavior   . Hypertension   . Kidney stones   . Pancreatitis, chronic ([place]) 05/28/2005    Past Surgical History:  Procedure Laterality Date  . APPENDECTOMY    . CHOLECYSTECTOMY    . ERCP N/A 12/30/2019   Procedure: ENDOSCOPIC RETROGRADE CHOLANGIOPANCREATOGRAPHY (ERCP);  Surgeon: Milus Banister, MD;  Location: [facility];  Service: Endoscopy;  Laterality: N/A;  . ERCP W/ METAL STENT PLACEMENT    . KIDNEY STONE SURGERY    . REMOVAL OF STONES  12/30/2019   Procedure: REMOVAL OF STONES;  Surgeon: Milus Banister, MD;  Location: [facility];  Service: Endoscopy;;    Family History  Problem Relation Age of Onset  . Cancer Mother   . Early death Mother   . Hypertension Mother   . Heart failure Father   . Hypertension Father   . Heart disease Father   . Hyperlipidemia Father   .  Pancreatic cancer Paternal Grandmother        possibly  . Pancreatic cancer Paternal Aunt   . Colon cancer Neg Hx     Social History   Socioeconomic History  . Marital status: Married    Spouse name: Not on file  . Number of children: Not on file  . Years of education: Not on file  . Highest education level: Not on file  Occupational History  . Not on file  Tobacco Use  . Smoking status: Current Every Day Smoker    Packs/day: 0.50    Types: Cigarettes  . Smokeless tobacco: Never Used  Vaping Use  . Vaping Use: Never used  Substance and Sexual Activity  . Alcohol use: Yes    Comment: rare  . Drug use: No  . Sexual activity: Not on file  Other Topics Concern  . Not on file  Social History Narrative  . Not on file   Social Determinants of Health   Financial Resource Strain:   . Difficulty of Paying Living Expenses: Not on file  Food Insecurity:   . Worried About Charity fundraiser in the Last Year: Not on file  . Ran Out of Food in the Last Year: Not on file  Transportation Needs:   . Lack of Transportation (Medical): Not on file  . Lack of Transportation (Non-Medical): Not on file  Physical Activity:   . Days of Exercise per Week: Not on file  .  Minutes of Exercise per Session: Not on file  Stress:   . Feeling of Stress : Not on file  Social Connections:   . Frequency of Communication with Friends and Family: Not on file  . Frequency of Social Gatherings with Friends and Family: Not on file  . Attends Religious Services: Not on file  . Active Member of Clubs or Organizations: Not on file  . Attends Archivist Meetings: Not on file  . Marital Status: Not on file  Intimate Partner Violence:   . Fear of Current or Ex-Partner: Not on file  . Emotionally Abused: Not on file  . Physically Abused: Not on file  . Sexually Abused: Not on file     Physical Exam   Vitals:   02/25/20 0520 02/25/20 0900  BP: (!) 130/101 127/90  Pulse: 78 68  Resp: 16 18    Temp: 98.2 F (36.8 C)   SpO2: 97% 94%    CONSTITUTIONAL: Well-appearing, NAD NEURO:  Alert and oriented x 3, no focal deficits EYES:  eyes equal and reactive ENT/NECK:  no LAD, no JVD CARDIO: Regular rate, well-perfused, normal S1 and S2 PULM:  CTAB no wheezing or rhonchi GI/GU:  normal bowel sounds, non-distended, minimal epigastric tenderness MSK/SPINE:  No gross deformities, no edema SKIN:  no rash, atraumatic PSYCH:  Appropriate speech and behavior  *Additional and/or pertinent findings included in MDM below  Diagnostic and Interventional Summary    EKG Interpretation  Date/Time:    Ventricular Rate:    PR Interval:    QRS Duration:   QT Interval:    QTC Calculation:   R Axis:     Text Interpretation:        Labs Reviewed  CBC - Abnormal; Notable for the following components:      Result Value   WBC 17.5 (*)    All other components within normal limits  COMPREHENSIVE METABOLIC PANEL - Abnormal; Notable for the following components:   Glucose, Bld 103 (*)    BUN 26 (*)    All other components within normal limits  LIPASE, BLOOD    No orders to display    Medications  sodium chloride 0.9 % bolus 1,000 mL (0 mLs Intravenous Stopped 02/25/20 0856)  ondansetron (ZOFRAN) injection 4 mg (4 mg Intravenous Given 02/25/20 0741)  famotidine (PEPCID) IVPB 20 mg premix (0 mg Intravenous Stopped 02/25/20 0810)  oxyCODONE (Oxy IR/ROXICODONE) immediate release tablet 10 mg (10 mg Oral Given 02/25/20 0741)  HYDROmorphone (DILAUDID) injection 1 mg (1 mg Intravenous Given 02/25/20 0855)     Procedures  /  Critical Care Procedures  ED Course and Medical Decision Making  I have reviewed the triage vital signs, the nursing notes, and pertinent available records from the EMR.  Listed above are laboratory and imaging tests that I personally ordered, reviewed, and interpreted and then considered in my medical decision making (see below for details).  Extensive GI evaluation in  the past as well as imaging with no clear-cut pathology.  Chronically taking opioids at home, unable to take medicines due to reported nausea vomiting and pain.  Overall benign abdominal exam, normal vital signs.  Lipase slightly elevated on last assessment, will recheck to ensure not rapidly rising.  Providing fluids given that he endorses some lightheadedness and possible dehydration, providing Zofran, Pepcid, home dose oxycodone and will reassess.  Currently with very low concern for emergent process, favoring acute exacerbation of chronic pain.  There is documented concerns for drug-seeking behavior  in the past and this will be kept in mind.  Thus far during this ED visit has been reasonable and is not specifically asking for opioids.     Patient's nausea improved with Zofran, continued pain despite home dose of oxycodone.  Provided single dose of Dilaudid and feeling much better, thinks that now he is over the hump and can manage at home with his home medicines until he can see his PCP early next week.  Labs reveal mild leukocytosis but no fever, abdominal exam continues to be reassuring, doubt emergent process.  Appropriate for discharge.  [name], [place] [email or web address]  Final Clinical Impressions(s) / ED Diagnoses     ICD-10-CM   1. Epigastric pain  R10.13     ED Discharge Orders    None       Discharge Instructions Discussed with and Provided to Patient:     Discharge Instructions     You were evaluated in the Emergency Department and after careful evaluation, we did not find any emergent condition requiring admission or further testing in the hospital.  Your exam/testing today was overall reassuring.  We recommend follow-up with your regular doctor to discuss your continued abdominal pain.  Please return to the Emergency Department if you experience any worsening of your condition.  Thank you for allowing [place]  to be a part of your care.        Maudie Flakes, MD 02/25/20 [phone]

## 2020-02-26 MED ORDER — OXYCODONE HCL 10 MG PO TABS
ORAL_TABLET | ORAL | 0 refills | Status: DC
Start: 2020-02-26 — End: 2020-02-29

## 2020-02-27 DIAGNOSIS — K861 Other chronic pancreatitis: Secondary | ICD-10-CM | POA: Diagnosis not present

## 2020-02-27 DIAGNOSIS — Z9049 Acquired absence of other specified parts of digestive tract: Secondary | ICD-10-CM | POA: Diagnosis not present

## 2020-02-27 DIAGNOSIS — R1012 Left upper quadrant pain: Secondary | ICD-10-CM | POA: Diagnosis not present

## 2020-02-27 DIAGNOSIS — F17210 Nicotine dependence, cigarettes, uncomplicated: Secondary | ICD-10-CM | POA: Diagnosis not present

## 2020-02-27 DIAGNOSIS — I10 Essential (primary) hypertension: Secondary | ICD-10-CM | POA: Diagnosis not present

## 2020-02-27 DIAGNOSIS — R112 Nausea with vomiting, unspecified: Secondary | ICD-10-CM | POA: Diagnosis not present

## 2020-02-27 DIAGNOSIS — Z9089 Acquired absence of other organs: Secondary | ICD-10-CM | POA: Diagnosis not present

## 2020-02-29 ENCOUNTER — Telehealth: Payer: Self-pay | Admitting: Family Medicine

## 2020-02-29 ENCOUNTER — Other Ambulatory Visit: Payer: Self-pay | Admitting: Family Medicine

## 2020-02-29 DIAGNOSIS — G8929 Other chronic pain: Secondary | ICD-10-CM

## 2020-02-29 DIAGNOSIS — G894 Chronic pain syndrome: Secondary | ICD-10-CM

## 2020-02-29 DIAGNOSIS — R109 Unspecified abdominal pain: Secondary | ICD-10-CM

## 2020-02-29 MED ORDER — OXYCODONE HCL 10 MG PO TABS: ORAL_TABLET | ORAL | 0 refills | Status: DC

## 2020-02-29 NOTE — Telephone Encounter (Signed)
Rx re-sent to be filled when he is due for refill. Thanks, BJ

## 2020-02-29 NOTE — Telephone Encounter (Signed)
Pt called in stating that he saw on mychart that his oxycodone went through but [facility] had not received it and this morning still had not received it and would like to see if it can be resent.

## 2020-03-01 ENCOUNTER — Encounter: Payer: Self-pay | Admitting: Family Medicine

## 2020-03-01 ENCOUNTER — Telehealth (INDEPENDENT_AMBULATORY_CARE_PROVIDER_SITE_OTHER): Payer: BLUE CROSS/BLUE SHIELD | Admitting: Family Medicine

## 2020-03-01 VITALS — BP 136/82 | Ht 73.0 in

## 2020-03-01 DIAGNOSIS — G894 Chronic pain syndrome: Secondary | ICD-10-CM | POA: Diagnosis not present

## 2020-03-01 DIAGNOSIS — E782 Mixed hyperlipidemia: Secondary | ICD-10-CM | POA: Diagnosis not present

## 2020-03-01 DIAGNOSIS — R112 Nausea with vomiting, unspecified: Secondary | ICD-10-CM | POA: Diagnosis not present

## 2020-03-01 DIAGNOSIS — I10 Essential (primary) hypertension: Secondary | ICD-10-CM

## 2020-03-01 NOTE — Progress Notes (Addendum)
Virtual Visit via Video Note I connected with [name] on 03/01/20 by a video enabled telemedicine application and verified that I am speaking with the correct person using two identifiers.  Location patient: home Location provider:work or home office Persons participating in the virtual visit: patient, provider  I discussed the limitations of evaluation and management by telemedicine and the availability of in person appointments. The patient expressed understanding and agreed to proceed.  HPI: [name] is a 44 yo male being seen for pain management and follow up.. Last seen on 01/04/2020.  Since last visit he has been in the ER 3 times: 02/16/2020, 02/19/2020, and 02/25/2020. Requesting related to be able to go back tonight to work. ER visits have been mainly due to nausea and vomiting, not able to hold liquids down. He is on Phenergan 25 mg twice daily as needed and Zofran 8 mg every 8 hours as needed. He has not identified exacerbating or alleviating factors.  Pain is stable. Negative for new associated symptoms.  He is taking oxycodone 10 mg 3 times daily as needed, no more than 70 tablets/month, and oxycodone ER 9 mg twice daily. So far he is tolerating medication well. It is not aggravated nausea and vomiting. Negative for fever, chills, abnormal weight loss, changes in bowel habits, melena, or blood in the stool. No urinary symptoms. Pending appointment with GI, Dr. Henrene Pastor.  HLD: He is on nonpharmacologic treatment.  Lab Results  Component Value Date   CHOL 207 (H) 06/03/2015   HDL 40 06/03/2015   LDLCALC 113 (H) 06/03/2015   TRIG 270 (H) 06/03/2015   CHOLHDL 5.2 (H) 06/03/2015   HTN: He is on metoprolol succinate 100 mg daily and amlodipine-benazepril 5-20 mg daily. Negative for unusual/severe headache, visual changes, CP, dyspnea, focal neurologic deficit, or edema.  Lab Results  Component Value Date   CREATININE 0.83 02/25/2020   BUN 26 (H) 02/25/2020   NA 138  02/25/2020   K 4.2 02/25/2020   CL 104 02/25/2020   CO2 22 02/25/2020    ROS: See pertinent positives and negatives per HPI.  Past Medical History:  Diagnosis Date  . Chronic abdominal pain   . Diverticulitis   . Drug-seeking behavior   . Hypertension   . Kidney stones   . Pancreatitis, chronic ([place]) 05/28/2005    Past Surgical History:  Procedure Laterality Date  . APPENDECTOMY    . CHOLECYSTECTOMY    . ERCP N/A 12/30/2019   Procedure: ENDOSCOPIC RETROGRADE CHOLANGIOPANCREATOGRAPHY (ERCP);  Surgeon: Milus Banister, MD;  Location: [facility];  Service: Endoscopy;  Laterality: N/A;  . ERCP W/ METAL STENT PLACEMENT    . KIDNEY STONE SURGERY    . REMOVAL OF STONES  12/30/2019   Procedure: REMOVAL OF STONES;  Surgeon: Milus Banister, MD;  Location: [facility];  Service: Endoscopy;;    Family History  Problem Relation Age of Onset  . Cancer Mother   . Early death Mother   . Hypertension Mother   . Heart failure Father   . Hypertension Father   . Heart disease Father   . Hyperlipidemia Father   . Pancreatic cancer Paternal Grandmother        possibly  . Pancreatic cancer Paternal Aunt   . Colon cancer Neg Hx     Social History   Socioeconomic History  . Marital status: Married    Spouse name: Not on file  . Number of children: Not on file  . Years of education: Not  on file  . Highest education level: Not on file  Occupational History  . Not on file  Tobacco Use  . Smoking status: Current Every Day Smoker    Packs/day: 0.50    Types: Cigarettes  . Smokeless tobacco: Never Used  Vaping Use  . Vaping Use: Never used  Substance and Sexual Activity  . Alcohol use: Yes    Comment: rare  . Drug use: No  . Sexual activity: Not on file  Other Topics Concern  . Not on file  Social History Narrative  . Not on file   Social Determinants of Health   Financial Resource Strain:   . Difficulty of Paying Living Expenses: Not on file  Food Insecurity:   .  Worried About Charity fundraiser in the Last Year: Not on file  . Ran Out of Food in the Last Year: Not on file  Transportation Needs:   . Lack of Transportation (Medical): Not on file  . Lack of Transportation (Non-Medical): Not on file  Physical Activity:   . Days of Exercise per Week: Not on file  . Minutes of Exercise per Session: Not on file  Stress:   . Feeling of Stress : Not on file  Social Connections:   . Frequency of Communication with Friends and Family: Not on file  . Frequency of Social Gatherings with Friends and Family: Not on file  . Attends Religious Services: Not on file  . Active Member of Clubs or Organizations: Not on file  . Attends Archivist Meetings: Not on file  . Marital Status: Not on file  Intimate Partner Violence:   . Fear of Current or Ex-Partner: Not on file  . Emotionally Abused: Not on file  . Physically Abused: Not on file  . Sexually Abused: Not on file    Current Outpatient Medications:  .  amLODipine-benazepril (LOTREL) 5-20 MG capsule, TAKE 1 CAPSULE BY MOUTH EVERY DAY, Disp: 90 capsule, Rfl: 1 .  fluticasone (FLONASE) 50 MCG/ACT nasal spray, Place 1 spray into both nostrils 2 (two) times daily., Disp: 16 g, Rfl: 2 .  metoprolol succinate (TOPROL-XL) 100 MG 24 hr tablet, TAKE 1 TABLET (100 MG TOTAL) BY MOUTH DAILY. TAKE WITH OR IMMEDIATELY FOLLOWING A MEAL., Disp: 90 tablet, Rfl: 2 .  naloxone (NARCAN) nasal spray 4 mg/0.1 mL, 1 spray in each nostril x 1 if opioid overdose., Disp: 1 each, Rfl: 1 .  nicotine (NICODERM CQ - DOSED IN MG/24 HOURS) 14 mg/24hr patch, Place 1 patch (14 mg total) onto the skin daily., Disp: 28 patch, Rfl: 0 .  omeprazole (PRILOSEC) 20 MG capsule, Take 1 capsule (20 mg total) by mouth daily., Disp: 30 capsule, Rfl: 0 .  ondansetron (ZOFRAN ODT) 8 MG disintegrating tablet, Take 1 tablet (8 mg total) by mouth every 8 (eight) hours as needed for nausea or vomiting., Disp: 20 tablet, Rfl: 1 .  oxyCODONE ER  (XTAMPZA ER) 9 MG C12A, Take 1 tablet by mouth 2 (two) times daily., Disp: 60 capsule, Rfl: 0 .  Oxycodone HCl 10 MG TABS, 1 tablet twice daily as needed, can take an extra tablet if needed.  No more than 70 tablets/month., Disp: 70 tablet, Rfl: 0 .  promethazine (PHENERGAN) 25 MG tablet, TAKE 1 TABLET (25 MG TOTAL) BY MOUTH EVERY 12 (TWELVE) HOURS AS NEEDED., Disp: 45 tablet, Rfl: 1  EXAM:  VITALS per patient if applicable:BP 944/96   Ht 6\' 1"  (1.854 m)   BMI 27.05 kg/m  GENERAL: alert, oriented, appears well and in no acute distress  HEENT: atraumatic, conjunctiva clear, no obvious abnormalities on inspection.  NECK: normal movements of the head and neck  LUNGS: on inspection no signs of respiratory distress, breathing rate appears normal, no obvious gross SOB, gasping or wheezing  CV: no obvious cyanosis  Leonard: moves all visible extremities without noticeable abnormality  PSYCH/NEURO: pleasant and cooperative, no obvious depression or anxiety, speech and thought processing grossly intact  ASSESSMENT AND PLAN:  Discussed the following assessment and plan:  Chronic pain disorder Current guideline in regard to chronic opioid has been discussed. Pain otherwise stable. Tolerating meds well. Med contract needs to be updated, next OV. PDMP reviewed. Urine tox 09/2019.  Nausea and vomiting in adult Chronic and stable. No changes in Zofran or phenergan dose.  Hypertension, essential, benign BP adequately controlled. Continue Metoprolol succinate 100 mg daily and Amlodipine-Benazepril 5-20 mg daily. Low salt diet. Continue monitoring BP regularly.  Hyperlipidemia, mixed Continue non pharmacologic treatment. Will plan on re-checking next visit, fasting labs.  I discussed the assessment and treatment plan with the patient. The patient was provided an opportunity to ask questions and all were answered. The patient agreed with the plan and demonstrated an understanding of the  instructions.   Return in about 3 months (around 06/01/2020) for 3-4 months.   Evely Gainey Martin[place], MD

## 2020-03-04 ENCOUNTER — Other Ambulatory Visit: Payer: Self-pay | Admitting: Family Medicine

## 2020-03-04 DIAGNOSIS — G894 Chronic pain syndrome: Secondary | ICD-10-CM

## 2020-03-06 ENCOUNTER — Encounter: Payer: Self-pay | Admitting: Family Medicine

## 2020-03-06 NOTE — Telephone Encounter (Signed)
Name of Medication: Oxycodone ER Name of Pharmacy: [facility] or Written Date and Quantity: 02-08-20 #60 Last Office Visit and Type: 03-01-20 3 Month f/u Next Office Visit and Type: No Future OV Last Controlled Substance Agreement Date: 09-28-19 Last UDS: 09-28-19

## 2020-03-09 MED ORDER — XTAMPZA ER 9 MG PO C12A
1.0000 | EXTENDED_RELEASE_CAPSULE | Freq: Two times a day (BID) | ORAL | 0 refills | Status: DC
Start: 2020-03-09 — End: 2020-04-05

## 2020-03-10 ENCOUNTER — Other Ambulatory Visit: Payer: Self-pay | Admitting: Family Medicine

## 2020-03-10 DIAGNOSIS — R112 Nausea with vomiting, unspecified: Secondary | ICD-10-CM

## 2020-03-10 DIAGNOSIS — K861 Other chronic pancreatitis: Secondary | ICD-10-CM

## 2020-03-11 NOTE — Telephone Encounter (Signed)
BJ-This is not on current med list/plz advise/thx dmf

## 2020-03-21 NOTE — Progress Notes (Signed)
The patient is scheduled for 06/02/2019 at 8:30 AM with Dr. Martinique

## 2020-03-28 ENCOUNTER — Other Ambulatory Visit: Payer: Self-pay | Admitting: Family Medicine

## 2020-03-28 DIAGNOSIS — G8929 Other chronic pain: Secondary | ICD-10-CM

## 2020-03-28 DIAGNOSIS — G894 Chronic pain syndrome: Secondary | ICD-10-CM

## 2020-03-30 ENCOUNTER — Encounter: Payer: Self-pay | Admitting: Family Medicine

## 2020-03-31 MED ORDER — OXYCODONE HCL 10 MG PO TABS: ORAL_TABLET | ORAL | 0 refills | Status: DC

## 2020-04-05 ENCOUNTER — Other Ambulatory Visit: Payer: Self-pay | Admitting: Family Medicine

## 2020-04-05 DIAGNOSIS — G894 Chronic pain syndrome: Secondary | ICD-10-CM

## 2020-04-06 MED ORDER — XTAMPZA ER 9 MG PO C12A: 1.0000 | EXTENDED_RELEASE_CAPSULE | Freq: Two times a day (BID) | ORAL | 0 refills | Status: DC

## 2020-04-13 ENCOUNTER — Emergency Department (HOSPITAL_BASED_OUTPATIENT_CLINIC_OR_DEPARTMENT_OTHER)
Admission: EM | Admit: 2020-04-13 | Discharge: 2020-04-14 | Disposition: A | Discharge status: Home / Self Care | Payer: BLUE CROSS/BLUE SHIELD | Attending: Emergency Medicine | Admitting: Emergency Medicine

## 2020-04-13 ENCOUNTER — Encounter (HOSPITAL_BASED_OUTPATIENT_CLINIC_OR_DEPARTMENT_OTHER): Payer: Self-pay | Admitting: *Deleted

## 2020-04-13 ENCOUNTER — Other Ambulatory Visit: Payer: Self-pay

## 2020-04-13 DIAGNOSIS — Z79899 Other long term (current) drug therapy: Secondary | ICD-10-CM | POA: Diagnosis not present

## 2020-04-13 DIAGNOSIS — F17210 Nicotine dependence, cigarettes, uncomplicated: Secondary | ICD-10-CM | POA: Insufficient documentation

## 2020-04-13 DIAGNOSIS — E876 Hypokalemia: Secondary | ICD-10-CM

## 2020-04-13 DIAGNOSIS — R1012 Left upper quadrant pain: Secondary | ICD-10-CM | POA: Insufficient documentation

## 2020-04-13 DIAGNOSIS — I10 Essential (primary) hypertension: Secondary | ICD-10-CM | POA: Insufficient documentation

## 2020-04-13 DIAGNOSIS — K861 Other chronic pancreatitis: Secondary | ICD-10-CM

## 2020-04-13 LAB — CBC WITH DIFFERENTIAL/PLATELET
Abs Immature Granulocytes: 0.06 10*3/uL (ref 0.00–0.07)
Basophils Absolute: 0.1 10*3/uL (ref 0.0–0.1)
Basophils Relative: 1 %
Eosinophils Absolute: 0.2 10*3/uL (ref 0.0–0.5)
Eosinophils Relative: 2 %
HCT: 45 % (ref 39.0–52.0)
Hemoglobin: 15.7 g/dL (ref 13.0–17.0)
Immature Granulocytes: 1 %
Lymphocytes Relative: 22 %
Lymphs Abs: 2.6 10*3/uL (ref 0.7–4.0)
MCH: 29.6 pg (ref 26.0–34.0)
MCHC: 34.9 g/dL (ref 30.0–36.0)
MCV: 84.7 fL (ref 80.0–100.0)
Monocytes Absolute: 1.1 10*3/uL — ABNORMAL HIGH (ref 0.1–1.0)
Monocytes Relative: 9 %
Neutro Abs: 8.1 10*3/uL — ABNORMAL HIGH (ref 1.7–7.7)
Neutrophils Relative %: 65 %
Platelets: 254 10*3/uL (ref 150–400)
RBC: 5.31 MIL/uL (ref 4.22–5.81)
RDW: 12.8 % (ref 11.5–15.5)
WBC: 12.1 10*3/uL — ABNORMAL HIGH (ref 4.0–10.5)
nRBC: 0 % (ref 0.0–0.2)

## 2020-04-13 LAB — COMPREHENSIVE METABOLIC PANEL
ALT: 32 U/L (ref 0–44)
AST: 29 U/L (ref 15–41)
Albumin: 4.2 g/dL (ref 3.5–5.0)
Alkaline Phosphatase: 61 U/L (ref 38–126)
Anion gap: 11 (ref 5–15)
BUN: 17 mg/dL (ref 6–20)
CO2: 21 mmol/L — ABNORMAL LOW (ref 22–32)
Calcium: 9.5 mg/dL (ref 8.9–10.3)
Chloride: 107 mmol/L (ref 98–111)
Creatinine, Ser: 1.09 mg/dL (ref 0.61–1.24)
GFR, Estimated: >60 mL/min (ref >60)
Glucose, Bld: 128 mg/dL — ABNORMAL HIGH (ref 70–99)
Potassium: 3.4 mmol/L — ABNORMAL LOW (ref 3.5–5.1)
Sodium: 139 mmol/L (ref 135–145)
Total Bilirubin: 0.4 mg/dL (ref 0.3–1.2)
Total Protein: 7.4 g/dL (ref 6.5–8.1)

## 2020-04-13 LAB — LIPASE, BLOOD: Lipase: 36 U/L (ref 11–51)

## 2020-04-13 MED ORDER — HYDROMORPHONE HCL 1 MG/ML IJ SOLN
0.5000 mg | Freq: Once | INTRAMUSCULAR | Status: AC
  Administered 2020-04-14: 0.5 mg via INTRAVENOUS
  Filled 2020-04-13: qty 1

## 2020-04-13 MED ORDER — SODIUM CHLORIDE 0.9 % IV BOLUS
1000.0000 mL | Freq: Once | INTRAVENOUS | Status: AC
  Administered 2020-04-13: 1000 mL via INTRAVENOUS

## 2020-04-13 MED ORDER — ONDANSETRON HCL 4 MG/2ML IJ SOLN
4.0000 mg | Freq: Once | INTRAMUSCULAR | Status: AC
  Administered 2020-04-13: 4 mg via INTRAVENOUS
  Filled 2020-04-13: qty 2

## 2020-04-13 MED ORDER — HYDROMORPHONE HCL 1 MG/ML IJ SOLN
1.0000 mg | Freq: Once | INTRAMUSCULAR | Status: AC
  Administered 2020-04-13: 1 mg via INTRAVENOUS
  Filled 2020-04-13: qty 1

## 2020-04-13 NOTE — ED Triage Notes (Signed)
C/o abd pain " pancreatitis" x 4 hrs

## 2020-04-13 NOTE — ED Provider Notes (Signed)
[facility] EMERGENCY DEPARTMENT Provider Note   CSN: [id number] Arrival date & time: 04/13/20  1856     History Chief Complaint  Patient presents with  . Abdominal Pain    [name] is a 44 y.o. male with PMHx HTN, Diverticulitis, chronic pain, chronic pancreatitis who presents to the ED today with complaint of sudden onset, constant, sharp, LUQ abdominal pain that began around 3:30 PM today. Pt also complains of nausea and NBNB emesis (~ 7 episodes). He reports history of similar symptoms; he has oxycodone, phenergan, and zofran at home. He tried to take these however vomited them up prompting him to come to the ED today. It appears pt has had an extensive GI workup in the past as well as imaging without a clear cut diagnosis. He reports symptoms began after having an abdominal surgery requiring biliary stent several years ago. Pt states his pain is typically of his flares that he has. Denies fevers, chills, diarrhea, constipation, urinary symptoms, or any other associated symptoms.   The history is provided by the patient and medical records.       Past Medical History:  Diagnosis Date  . Chronic abdominal pain   . Diverticulitis   . Drug-seeking behavior   . Hypertension   . Kidney stones   . Pancreatitis, chronic ([place]) 05/28/2005    Patient Active Problem List   Diagnosis Date Noted  . Hyperlipidemia, mixed 03/01/2020  . Abnormal magnetic resonance cholangiopancreatography (MRCP)   . Choledocholithiasis   . E coli bacteremia   . Bacteremia due to Enterobacter species   . Bacteremia 12/27/2019  . QT prolongation 12/27/2019  . Nausea without vomiting   . Chronic pain disorder 08/29/2018  . Back pain, chronic 08/29/2018  . Nausea and vomiting in adult 02/25/2018  . Hypertension, essential, benign 01/18/2018  . Chronic biliary pancreatitis ([place]) 01/18/2018  . Chronic abdominal pain 01/14/2018    Past Surgical History:  Procedure Laterality Date    . APPENDECTOMY    . CHOLECYSTECTOMY    . ERCP N/A 12/30/2019   Procedure: ENDOSCOPIC RETROGRADE CHOLANGIOPANCREATOGRAPHY (ERCP);  Surgeon: Milus Banister, MD;  Location: [facility];  Service: Endoscopy;  Laterality: N/A;  . ERCP W/ METAL STENT PLACEMENT    . KIDNEY STONE SURGERY    . REMOVAL OF STONES  12/30/2019   Procedure: REMOVAL OF STONES;  Surgeon: Milus Banister, MD;  Location: [facility];  Service: Endoscopy;;       Family History  Problem Relation Age of Onset  . Cancer Mother   . Early death Mother   . Hypertension Mother   . Heart failure Father   . Hypertension Father   . Heart disease Father   . Hyperlipidemia Father   . Pancreatic cancer Paternal Grandmother        possibly  . Pancreatic cancer Paternal Aunt   . Colon cancer Neg Hx     Social History   Tobacco Use  . Smoking status: Current Every Day Smoker    Packs/day: 0.50    Types: Cigarettes  . Smokeless tobacco: Never Used  Vaping Use  . Vaping Use: Never used  Substance Use Topics  . Alcohol use: Yes    Comment: rare  . Drug use: No    Home Medications Prior to Admission medications   Medication Sig Start Date End Date Taking? Authorizing Provider  amLODipine-benazepril (LOTREL) 5-20 MG capsule TAKE 1 CAPSULE BY MOUTH EVERY DAY 11/19/19   Martinique, Betty G, MD  fluticasone (FLONASE) 50 MCG/ACT nasal spray Place 1 spray into both nostrils 2 (two) times daily. 04/03/19   Martinique, Betty G, MD  metoprolol succinate (TOPROL-XL) 100 MG 24 hr tablet TAKE 1 TABLET (100 MG TOTAL) BY MOUTH DAILY. TAKE WITH OR IMMEDIATELY FOLLOWING A MEAL. 01/22/20   Martinique, Betty G, MD  naloxone [facility]) nasal spray 4 mg/0.1 mL 1 spray in each nostril x 1 if opioid overdose. 03/23/19   Martinique, Betty G, MD  nicotine (NICODERM CQ - DOSED IN MG/24 HOURS) 14 mg/24hr patch Place 1 patch (14 mg total) onto the skin daily. 12/31/19   Eugenie Filler, MD  omeprazole (PRILOSEC) 20 MG capsule Take 1 capsule (20 mg total) by mouth  daily. 03/23/19   Horton, Barbette Hair, MD  ondansetron (ZOFRAN ODT) 8 MG disintegrating tablet Take 1 tablet (8 mg total) by mouth every 8 (eight) hours as needed for nausea or vomiting. 06/24/19   Martinique, Betty G, MD  oxyCODONE ER [facility]) 9 MG C12A Take 1 tablet by mouth 2 (two) times daily. 04/06/20   Martinique, Betty G, MD  Oxycodone HCl 10 MG TABS 1 tablet twice daily as needed, can take an extra tablet if needed.  No more than 70 tablets/month. 03/31/20   Martinique, Betty G, MD  promethazine (PHENERGAN) 25 MG tablet TAKE 1 TABLET (25 MG TOTAL) BY MOUTH EVERY 12 (TWELVE) HOURS AS NEEDED. 03/15/20   Martinique, Betty G, MD    Allergies    Cortisone, Eggs or egg-derived products, Ketorolac, Ketorolac tromethamine, Haldol [haloperidol lactate], Haloperidol, Hydrocortisone, Iodinated diagnostic agents, Ketorolac tromethamine, and Reglan [metoclopramide]  Review of Systems   Review of Systems  Constitutional: Negative for chills and fever.  Gastrointestinal: Positive for abdominal pain, nausea and vomiting.  All other systems reviewed and are negative.   Physical Exam Updated Vital Signs BP 129/87 (BP Location: Right Arm)   Pulse 79   Temp 98.7 F (37.1 C) (Oral)   Resp 16   Ht 6\' 1"  (1.854 m)   Wt 93 kg   SpO2 98%   BMI 27.05 kg/m   Physical Exam Vitals and nursing note reviewed.  Constitutional:      Appearance: He is not ill-appearing or diaphoretic.     Comments: Uncomfortable appearing  HENT:     Head: Normocephalic and atraumatic.  Eyes:     Conjunctiva/sclera: Conjunctivae normal.  Cardiovascular:     Rate and Rhythm: Normal rate and regular rhythm.  Pulmonary:     Effort: Pulmonary effort is normal.     Breath sounds: Normal breath sounds. No wheezing, rhonchi or rales.  Abdominal:     Palpations: Abdomen is soft.     Tenderness: There is abdominal tenderness in the left upper quadrant. There is no guarding or rebound.  Musculoskeletal:     Cervical back: Neck supple.    Skin:    General: Skin is warm and dry.  Neurological:     Mental Status: He is alert.     ED Results / Procedures / Treatments   Labs (all labs ordered are listed, but only abnormal results are displayed) Labs Reviewed  CBC WITH DIFFERENTIAL/PLATELET - Abnormal; Notable for the following components:      Result Value   WBC 12.1 (*)    Neutro Abs 8.1 (*)    Monocytes Absolute 1.1 (*)    All other components within normal limits  COMPREHENSIVE METABOLIC PANEL - Abnormal; Notable for the following components:   Potassium 3.4 (*)  CO2 21 (*)    Glucose, Bld 128 (*)    All other components within normal limits  LIPASE, BLOOD  URINALYSIS, ROUTINE W REFLEX MICROSCOPIC    EKG None  Radiology No results found.  Procedures Procedures (including critical care time)  Medications Ordered in ED Medications  sodium chloride 0.9 % bolus 1,000 mL (has no administration in time range)  ondansetron (ZOFRAN) injection 4 mg (has no administration in time range)  HYDROmorphone (DILAUDID) injection 1 mg (has no administration in time range)    ED Course  I have reviewed the triage vital signs and the nursing notes.  Pertinent labs & imaging results that were available during my care of the patient were reviewed by me and considered in my medical decision making (see chart for details).    MDM Rules/Calculators/A&P                          44 year old male with a history of chronic pancreatitis who presents to the ED today with complaint of recurrent left upper quadrant pain that started around 3:30 PM today with associated nausea and vomiting uncontrolled with medications at home.  On arrival to the ED patient is afebrile, nontachycardic and nontachypneic.  He does appear uncomfortable on exam today.  He has left upper quadrant tenderness palpation without rebound or guarding.  He states this feels similar to previous flares.  He has been seen previously in the ED and was treated  symptomatically and discharged home after improvement in symptoms including vomiting.  Lab work was obtained prior to patient being seen; CBC with mild leukocytosis of 12.1.  Improved from previous ED visits.  CMP with glucose 128, bicarb 21.  No gap.  Potassium 3.4.  Lipase normal at 36.  Still pending urinalysis, patient does not think he can urinate today.  Will provide fluids, pain meds, antiemetics and reassess.  Do not feel patient needs CT scan at this time.   On reevaluation after pains meds and fluids pt states his pain is still somewhat there. Will provide 1 time dose of 0.5 mg dilaudid and fluid challenge pt. At shift change case signed out to oncoming ED physician Dr. Roxanne Mins who will dispo patient if able to tolerate fluids. Pt has pain meds and nausea meds at home that he can take.   This note was prepared using Dragon voice recognition software and may include unintentional dictation errors due to the inherent limitations of voice recognition software.  Final Clinical Impression(s) / ED Diagnoses Final diagnoses:  None    Rx / DC Orders ED Discharge Orders    None       Eustaquio Maize, PA-C 04/14/20 0013    Lucrezia Starch, MD 04/15/20 [phone]

## 2020-04-14 LAB — URINALYSIS, ROUTINE W REFLEX MICROSCOPIC
Bilirubin Urine: NEGATIVE
Glucose, UA: NEGATIVE mg/dL
Hgb urine dipstick: NEGATIVE
Ketones, ur: NEGATIVE mg/dL
Leukocytes,Ua: NEGATIVE
Nitrite: NEGATIVE
Protein, ur: NEGATIVE mg/dL
Specific Gravity, Urine: >1.03 — ABNORMAL HIGH (ref 1.005–1.030)
pH: 5.5 (ref 5.0–8.0)

## 2020-04-14 MED ORDER — POTASSIUM CHLORIDE CRYS ER 20 MEQ PO TBCR
20.0000 meq | EXTENDED_RELEASE_TABLET | Freq: Once | ORAL | Status: AC
  Administered 2020-04-14: 20 meq via ORAL
  Filled 2020-04-14: qty 1

## 2020-04-14 NOTE — ED Provider Notes (Signed)
Care assumed from [facility], patinet with chronic pancreatitis pending oral fluid challenge. He tolerated oral fluids with no vomiting, and is felt to be safe for discharge.   Delora Fuel, MD 16/60/63 [phone]

## 2020-04-14 NOTE — Discharge Instructions (Addendum)
Please follow up with your PCP and GI doctor regarding your ED visit today. Have your potassium level rechecked in 1-2 weeks as it was mildly low today.  Take your regular home meds as prescribed Return to the ED for any worsening symptoms

## 2020-04-22 DIAGNOSIS — Z888 Allergy status to other drugs, medicaments and biological substances status: Secondary | ICD-10-CM | POA: Diagnosis not present

## 2020-04-22 DIAGNOSIS — Z79899 Other long term (current) drug therapy: Secondary | ICD-10-CM | POA: Diagnosis not present

## 2020-04-22 DIAGNOSIS — R112 Nausea with vomiting, unspecified: Secondary | ICD-10-CM | POA: Diagnosis not present

## 2020-04-22 DIAGNOSIS — Z79891 Long term (current) use of opiate analgesic: Secondary | ICD-10-CM | POA: Diagnosis not present

## 2020-04-22 DIAGNOSIS — Z91012 Allergy to eggs: Secondary | ICD-10-CM | POA: Diagnosis not present

## 2020-04-22 DIAGNOSIS — R109 Unspecified abdominal pain: Secondary | ICD-10-CM | POA: Diagnosis not present

## 2020-04-22 DIAGNOSIS — K861 Other chronic pancreatitis: Secondary | ICD-10-CM | POA: Diagnosis not present

## 2020-04-22 DIAGNOSIS — R1013 Epigastric pain: Secondary | ICD-10-CM | POA: Diagnosis not present

## 2020-04-22 DIAGNOSIS — I10 Essential (primary) hypertension: Secondary | ICD-10-CM | POA: Diagnosis not present

## 2020-04-24 DIAGNOSIS — R112 Nausea with vomiting, unspecified: Secondary | ICD-10-CM | POA: Diagnosis not present

## 2020-04-24 DIAGNOSIS — Z720 Tobacco use: Secondary | ICD-10-CM | POA: Diagnosis not present

## 2020-04-24 DIAGNOSIS — K861 Other chronic pancreatitis: Secondary | ICD-10-CM | POA: Diagnosis not present

## 2020-04-25 ENCOUNTER — Encounter: Payer: Self-pay | Admitting: Family Medicine

## 2020-04-26 ENCOUNTER — Other Ambulatory Visit: Payer: Self-pay | Admitting: Family Medicine

## 2020-04-26 DIAGNOSIS — G8929 Other chronic pain: Secondary | ICD-10-CM

## 2020-04-26 DIAGNOSIS — R1013 Epigastric pain: Secondary | ICD-10-CM | POA: Diagnosis not present

## 2020-04-26 DIAGNOSIS — G894 Chronic pain syndrome: Secondary | ICD-10-CM

## 2020-04-26 DIAGNOSIS — E876 Hypokalemia: Secondary | ICD-10-CM | POA: Diagnosis not present

## 2020-04-26 DIAGNOSIS — D72829 Elevated white blood cell count, unspecified: Secondary | ICD-10-CM | POA: Diagnosis not present

## 2020-04-26 DIAGNOSIS — K838 Other specified diseases of biliary tract: Secondary | ICD-10-CM | POA: Diagnosis not present

## 2020-04-26 DIAGNOSIS — R112 Nausea with vomiting, unspecified: Secondary | ICD-10-CM | POA: Diagnosis not present

## 2020-04-27 DIAGNOSIS — R1013 Epigastric pain: Secondary | ICD-10-CM | POA: Diagnosis not present

## 2020-05-02 ENCOUNTER — Other Ambulatory Visit: Payer: Self-pay | Admitting: Family Medicine

## 2020-05-02 ENCOUNTER — Encounter: Payer: Self-pay | Admitting: Family Medicine

## 2020-05-02 DIAGNOSIS — G894 Chronic pain syndrome: Secondary | ICD-10-CM

## 2020-05-02 DIAGNOSIS — G8929 Other chronic pain: Secondary | ICD-10-CM

## 2020-05-02 MED ORDER — OXYCODONE HCL 10 MG PO TABS: ORAL_TABLET | ORAL | 0 refills | Status: DC

## 2020-05-02 NOTE — Telephone Encounter (Signed)
This message if about [name]. Winthrop Shannahan called in to let Dr. Martinique know that [name] is still saying that her ear hurts and wants to know if there is something else that can be called in.  Please advise

## 2020-05-09 ENCOUNTER — Other Ambulatory Visit: Payer: Self-pay | Admitting: Family Medicine

## 2020-05-09 DIAGNOSIS — G894 Chronic pain syndrome: Secondary | ICD-10-CM

## 2020-05-09 MED ORDER — XTAMPZA ER 9 MG PO C12A: 1.0000 | EXTENDED_RELEASE_CAPSULE | Freq: Two times a day (BID) | ORAL | 0 refills | Status: DC

## 2020-05-19 ENCOUNTER — Other Ambulatory Visit: Payer: Self-pay | Admitting: Family Medicine

## 2020-05-24 ENCOUNTER — Other Ambulatory Visit: Payer: Self-pay | Admitting: Family Medicine

## 2020-05-24 DIAGNOSIS — G8929 Other chronic pain: Secondary | ICD-10-CM

## 2020-05-24 DIAGNOSIS — G894 Chronic pain syndrome: Secondary | ICD-10-CM

## 2020-05-25 ENCOUNTER — Other Ambulatory Visit: Payer: Self-pay

## 2020-05-25 ENCOUNTER — Encounter (HOSPITAL_BASED_OUTPATIENT_CLINIC_OR_DEPARTMENT_OTHER): Payer: Self-pay | Admitting: Emergency Medicine

## 2020-05-25 ENCOUNTER — Emergency Department (HOSPITAL_BASED_OUTPATIENT_CLINIC_OR_DEPARTMENT_OTHER)
Admission: EM | Admit: 2020-05-25 | Discharge: 2020-05-25 | Disposition: A | Discharge status: Home / Self Care | Payer: BLUE CROSS/BLUE SHIELD | Attending: Emergency Medicine | Admitting: Emergency Medicine

## 2020-05-25 DIAGNOSIS — I10 Essential (primary) hypertension: Secondary | ICD-10-CM | POA: Diagnosis not present

## 2020-05-25 DIAGNOSIS — K861 Other chronic pancreatitis: Secondary | ICD-10-CM | POA: Diagnosis not present

## 2020-05-25 DIAGNOSIS — F17210 Nicotine dependence, cigarettes, uncomplicated: Secondary | ICD-10-CM | POA: Insufficient documentation

## 2020-05-25 DIAGNOSIS — R109 Unspecified abdominal pain: Secondary | ICD-10-CM | POA: Diagnosis not present

## 2020-05-25 LAB — COMPREHENSIVE METABOLIC PANEL
ALT: 24 U/L (ref 0–44)
AST: 21 U/L (ref 15–41)
Albumin: 4.2 g/dL (ref 3.5–5.0)
Alkaline Phosphatase: 63 U/L (ref 38–126)
Anion gap: 9 (ref 5–15)
BUN: 14 mg/dL (ref 6–20)
CO2: 23 mmol/L (ref 22–32)
Calcium: 9.1 mg/dL (ref 8.9–10.3)
Chloride: 106 mmol/L (ref 98–111)
Creatinine, Ser: 0.84 mg/dL (ref 0.61–1.24)
GFR, Estimated: >60 mL/min (ref >60)
Glucose, Bld: 101 mg/dL — ABNORMAL HIGH (ref 70–99)
Potassium: 3.3 mmol/L — ABNORMAL LOW (ref 3.5–5.1)
Sodium: 138 mmol/L (ref 135–145)
Total Bilirubin: 0.7 mg/dL (ref 0.3–1.2)
Total Protein: 7 g/dL (ref 6.5–8.1)

## 2020-05-25 LAB — CBC WITH DIFFERENTIAL/PLATELET
Abs Immature Granulocytes: 0.13 10*3/uL — ABNORMAL HIGH (ref 0.00–0.07)
Basophils Absolute: 0.1 10*3/uL (ref 0.0–0.1)
Basophils Relative: 1 %
Eosinophils Absolute: 0.2 10*3/uL (ref 0.0–0.5)
Eosinophils Relative: 1 %
HCT: 44.7 % (ref 39.0–52.0)
Hemoglobin: 16.2 g/dL (ref 13.0–17.0)
Immature Granulocytes: 1 %
Lymphocytes Relative: 23 %
Lymphs Abs: 3.1 10*3/uL (ref 0.7–4.0)
MCH: 30.9 pg (ref 26.0–34.0)
MCHC: 36.2 g/dL — ABNORMAL HIGH (ref 30.0–36.0)
MCV: 85.1 fL (ref 80.0–100.0)
Monocytes Absolute: 1.1 10*3/uL — ABNORMAL HIGH (ref 0.1–1.0)
Monocytes Relative: 8 %
Neutro Abs: 9.1 10*3/uL — ABNORMAL HIGH (ref 1.7–7.7)
Neutrophils Relative %: 66 %
Platelets: 270 10*3/uL (ref 150–400)
RBC: 5.25 MIL/uL (ref 4.22–5.81)
RDW: 12.9 % (ref 11.5–15.5)
WBC: 13.8 10*3/uL — ABNORMAL HIGH (ref 4.0–10.5)
nRBC: 0 % (ref 0.0–0.2)

## 2020-05-25 LAB — LIPASE, BLOOD: Lipase: 29 U/L (ref 11–51)

## 2020-05-25 MED ORDER — SODIUM CHLORIDE 0.9 % IV BOLUS
1000.0000 mL | Freq: Once | INTRAVENOUS | Status: AC
  Administered 2020-05-25: 1000 mL via INTRAVENOUS

## 2020-05-25 MED ORDER — HYDROMORPHONE HCL 1 MG/ML IJ SOLN
0.5000 mg | Freq: Once | INTRAMUSCULAR | Status: AC
  Administered 2020-05-25: 0.5 mg via INTRAVENOUS
  Filled 2020-05-25: qty 1

## 2020-05-25 MED ORDER — HYDROMORPHONE HCL 1 MG/ML IJ SOLN
1.0000 mg | Freq: Once | INTRAMUSCULAR | Status: AC
  Administered 2020-05-25: 1 mg via INTRAVENOUS
  Filled 2020-05-25: qty 1

## 2020-05-25 MED ORDER — ONDANSETRON HCL 4 MG/2ML IJ SOLN
4.0000 mg | Freq: Once | INTRAMUSCULAR | Status: AC
  Administered 2020-05-25: 4 mg via INTRAVENOUS
  Filled 2020-05-25: qty 2

## 2020-05-25 NOTE — ED Triage Notes (Signed)
Pt states he has a hx of pancreatitis and started having nausea and vomiting yesterday around lunch time  Pt is c/o abd pain tonight  Pt states he has medications at home but now is unable to hold them down

## 2020-05-25 NOTE — Discharge Instructions (Signed)

## 2020-05-25 NOTE — ED Provider Notes (Signed)
Emergency Department Provider Note   I have reviewed the triage vital signs and the nursing notes.   HISTORY  Chief Complaint Abdominal Pain   HPI [name] is a 44 y.o. male with past medical history reviewed below including pancreatitis and chronic abdominal pain presents to the emergency department with return of pancreatitis-like symptoms.  He denies fevers or chills.  He states he is developed vomiting and has been unable to keep down his home pain medications.  He is not experiencing any chest pain or shortness of breath.  No diarrhea.  No URI symptoms.  Pain is moderate to severe and located in the left upper quadrant without radiation. Sharp in quality.   Past Medical History:  Diagnosis Date  . Chronic abdominal pain   . Diverticulitis   . Drug-seeking behavior   . Hypertension   . Kidney stones   . Pancreatitis, chronic ([facility]) 05/28/2005    Patient Active Problem List   Diagnosis Date Noted  . Hyperlipidemia, mixed 03/01/2020  . Abnormal magnetic resonance cholangiopancreatography (MRCP)   . Choledocholithiasis   . E coli bacteremia   . Bacteremia due to Enterobacter species   . Bacteremia 12/27/2019  . QT prolongation 12/27/2019  . Nausea without vomiting   . Chronic pain disorder 08/29/2018  . Back pain, chronic 08/29/2018  . Nausea and vomiting in adult 02/25/2018  . Hypertension, essential, benign 01/18/2018  . Chronic biliary pancreatitis ([facility]) 01/18/2018  . Chronic abdominal pain 01/14/2018    Past Surgical History:  Procedure Laterality Date  . APPENDECTOMY    . CHOLECYSTECTOMY    . ERCP N/A 12/30/2019   Procedure: ENDOSCOPIC RETROGRADE CHOLANGIOPANCREATOGRAPHY (ERCP);  Surgeon: Rachael Fee, MD;  Location: [facility];  Service: Endoscopy;  Laterality: N/A;  . ERCP W/ METAL STENT PLACEMENT    . KIDNEY STONE SURGERY    . REMOVAL OF STONES  12/30/2019   Procedure: REMOVAL OF STONES;  Surgeon: Rachael Fee, MD;  Location: [facility];  Service: Endoscopy;;    Allergies Cortisone, Eggs or egg-derived products, Ketorolac, Ketorolac tromethamine, Haldol [haloperidol lactate], Haloperidol, Hydrocortisone, Iodinated diagnostic agents, Ketorolac tromethamine, and Reglan [metoclopramide]  Family History  Problem Relation Age of Onset  . Cancer Mother   . Early death Mother   . Hypertension Mother   . Heart failure Father   . Hypertension Father   . Heart disease Father   . Hyperlipidemia Father   . Pancreatic cancer Paternal Grandmother        possibly  . Pancreatic cancer Paternal Aunt   . Colon cancer Neg Hx     Social History Social History   Tobacco Use  . Smoking status: Current Every Day Smoker    Packs/day: 0.50    Types: Cigarettes  . Smokeless tobacco: Never Used  Vaping Use  . Vaping Use: Never used  Substance Use Topics  . Alcohol use: Yes    Comment: rare  . Drug use: No    Review of Systems  Constitutional: No fever/chills Eyes: No visual changes. ENT: No sore throat. Cardiovascular: Denies chest pain. Respiratory: Denies shortness of breath. Gastrointestinal: Positive LUQ abdominal pain. Positive nausea and vomiting.  No diarrhea.  No constipation. Genitourinary: Negative for dysuria. Musculoskeletal: Negative for back pain. Skin: Negative for rash. Neurological: Negative for headaches, focal weakness or numbness.  10-point ROS otherwise negative.  ____________________________________________   PHYSICAL EXAM:  VITAL SIGNS: ED Triage Vitals  Enc Vitals Group     BP 05/25/20 0240 Marland Kitchen)  154/99     Pulse Rate 05/25/20 0240 67     Resp 05/25/20 0240 16     Temp 05/25/20 0240 98.3 F (36.8 C)     Temp Source 05/25/20 0240 Oral     SpO2 05/25/20 0240 98 %     Weight 05/25/20 0241 205 lb (93 kg)     Height 05/25/20 0241 6\' 1"  (1.854 m)   Constitutional: Alert and oriented. Well appearing and in no acute distress. Eyes: Conjunctivae are normal. Head: Atraumatic. Nose:  No congestion/rhinnorhea. Mouth/Throat: Mucous membranes are moist Neck: No stridor.   Cardiovascular: Normal rate, regular rhythm. Good peripheral circulation. Grossly normal heart sounds.   Respiratory: Normal respiratory effort.  No retractions. Lungs CTAB. Gastrointestinal: Soft with focal tenderness in the left upper quadrant.  No rebound or guarding. No distention.  Musculoskeletal: No lower extremity tenderness nor edema. No gross deformities of extremities. Neurologic:  Normal speech and language. No gross focal neurologic deficits are appreciated.  Skin:  Skin is warm, dry and intact. No rash noted.  ____________________________________________   LABS (all labs ordered are listed, but only abnormal results are displayed)  Labs Reviewed  COMPREHENSIVE METABOLIC PANEL - Abnormal; Notable for the following components:      Result Value   Potassium 3.3 (*)    Glucose, Bld 101 (*)    All other components within normal limits  CBC WITH DIFFERENTIAL/PLATELET - Abnormal; Notable for the following components:   WBC 13.8 (*)    MCHC 36.2 (*)    Neutro Abs 9.1 (*)    Monocytes Absolute 1.1 (*)    Abs Immature Granulocytes 0.13 (*)    All other components within normal limits  LIPASE, BLOOD   ____________________________________________   PROCEDURES  Procedure(s) performed:   Procedures  None  ____________________________________________   INITIAL IMPRESSION / ASSESSMENT AND PLAN / ED COURSE  Pertinent labs & imaging results that were available during my care of the patient were reviewed by me and considered in my medical decision making (see chart for details).   Patient presents to the emergency department for evaluation of left upper quadrant abdominal pain along with nausea and vomiting consistent with his chronic pancreatitis pain.  Labs are reassuring.  Lipase is normal but in the setting of chronic pancreatitis would expect this.  Mild leukocytosis but afebrile and  no SIRS vitals here.  No peritoneal findings on abdominal exam to prompt a CT imaging of the abdomen and pelvis.  Patient given IV fluids along with pain and nausea medication and feeling improved.  He is tolerating PO in the ED and stable for discharge. Will continue home pain medications as prescribed.    ____________________________________________  FINAL CLINICAL IMPRESSION(S) / ED DIAGNOSES  Final diagnoses:  Chronic pancreatitis, unspecified pancreatitis type ([place])     MEDICATIONS GIVEN DURING THIS VISIT:  Medications  sodium chloride 0.9 % bolus 1,000 mL (0 mLs Intravenous Stopped 05/25/20 0629)  ondansetron (ZOFRAN) injection 4 mg (4 mg Intravenous Given 05/25/20 0555)  HYDROmorphone (DILAUDID) injection 1 mg (1 mg Intravenous Given 05/25/20 0555)  HYDROmorphone (DILAUDID) injection 0.5 mg (0.5 mg Intravenous Given 05/25/20 [id number])    Note:  This document was prepared using Dragon voice recognition software and may include unintentional dictation errors.  Nanda Quinton, MD, [facility] Emergency Medicine    Demauri Advincula, [facility], MD 05/26/20 [phone]

## 2020-05-28 ENCOUNTER — Other Ambulatory Visit: Payer: Self-pay

## 2020-05-28 ENCOUNTER — Emergency Department (HOSPITAL_BASED_OUTPATIENT_CLINIC_OR_DEPARTMENT_OTHER)
Admission: EM | Admit: 2020-05-28 | Discharge: 2020-05-28 | Disposition: A | Discharge status: Home / Self Care | Payer: BLUE CROSS/BLUE SHIELD | Attending: Emergency Medicine | Admitting: Emergency Medicine

## 2020-05-28 ENCOUNTER — Encounter (HOSPITAL_BASED_OUTPATIENT_CLINIC_OR_DEPARTMENT_OTHER): Payer: Self-pay

## 2020-05-28 DIAGNOSIS — F17210 Nicotine dependence, cigarettes, uncomplicated: Secondary | ICD-10-CM | POA: Insufficient documentation

## 2020-05-28 DIAGNOSIS — Z79899 Other long term (current) drug therapy: Secondary | ICD-10-CM | POA: Diagnosis not present

## 2020-05-28 DIAGNOSIS — K861 Other chronic pancreatitis: Secondary | ICD-10-CM | POA: Diagnosis not present

## 2020-05-28 DIAGNOSIS — I10 Essential (primary) hypertension: Secondary | ICD-10-CM | POA: Diagnosis not present

## 2020-05-28 DIAGNOSIS — R1012 Left upper quadrant pain: Secondary | ICD-10-CM | POA: Diagnosis not present

## 2020-05-28 DIAGNOSIS — Z87442 Personal history of urinary calculi: Secondary | ICD-10-CM | POA: Insufficient documentation

## 2020-05-28 DIAGNOSIS — R1084 Generalized abdominal pain: Secondary | ICD-10-CM | POA: Diagnosis not present

## 2020-05-28 LAB — CBC WITH DIFFERENTIAL/PLATELET
Abs Immature Granulocytes: 0.09 10*3/uL — ABNORMAL HIGH (ref 0.00–0.07)
Basophils Absolute: 0.1 10*3/uL (ref 0.0–0.1)
Basophils Relative: 1 %
Eosinophils Absolute: 0.2 10*3/uL (ref 0.0–0.5)
Eosinophils Relative: 1 %
HCT: 44.2 % (ref 39.0–52.0)
Hemoglobin: 15.8 g/dL (ref 13.0–17.0)
Immature Granulocytes: 1 %
Lymphocytes Relative: 23 %
Lymphs Abs: 2.7 10*3/uL (ref 0.7–4.0)
MCH: 29.9 pg (ref 26.0–34.0)
MCHC: 35.7 g/dL (ref 30.0–36.0)
MCV: 83.7 fL (ref 80.0–100.0)
Monocytes Absolute: 0.9 10*3/uL (ref 0.1–1.0)
Monocytes Relative: 8 %
Neutro Abs: 8 10*3/uL — ABNORMAL HIGH (ref 1.7–7.7)
Neutrophils Relative %: 66 %
Platelets: 251 10*3/uL (ref 150–400)
RBC: 5.28 MIL/uL (ref 4.22–5.81)
RDW: 12.6 % (ref 11.5–15.5)
WBC: 11.9 10*3/uL — ABNORMAL HIGH (ref 4.0–10.5)
nRBC: 0 % (ref 0.0–0.2)

## 2020-05-28 LAB — COMPREHENSIVE METABOLIC PANEL
ALT: 23 U/L (ref 0–44)
AST: 21 U/L (ref 15–41)
Albumin: 4.5 g/dL (ref 3.5–5.0)
Alkaline Phosphatase: 68 U/L (ref 38–126)
Anion gap: 10 (ref 5–15)
BUN: 14 mg/dL (ref 6–20)
CO2: 21 mmol/L — ABNORMAL LOW (ref 22–32)
Calcium: 9.4 mg/dL (ref 8.9–10.3)
Chloride: 107 mmol/L (ref 98–111)
Creatinine, Ser: 0.79 mg/dL (ref 0.61–1.24)
GFR, Estimated: >60 mL/min (ref >60)
Glucose, Bld: 105 mg/dL — ABNORMAL HIGH (ref 70–99)
Potassium: 3.2 mmol/L — ABNORMAL LOW (ref 3.5–5.1)
Sodium: 138 mmol/L (ref 135–145)
Total Bilirubin: 0.7 mg/dL (ref 0.3–1.2)
Total Protein: 7.3 g/dL (ref 6.5–8.1)

## 2020-05-28 LAB — URINALYSIS, ROUTINE W REFLEX MICROSCOPIC
Bilirubin Urine: NEGATIVE
Glucose, UA: NEGATIVE mg/dL
Hgb urine dipstick: NEGATIVE
Ketones, ur: NEGATIVE mg/dL
Leukocytes,Ua: NEGATIVE
Nitrite: NEGATIVE
Protein, ur: NEGATIVE mg/dL
Specific Gravity, Urine: 1.01 (ref 1.005–1.030)
pH: 6 (ref 5.0–8.0)

## 2020-05-28 LAB — LIPASE, BLOOD: Lipase: 38 U/L (ref 11–51)

## 2020-05-28 MED ORDER — HYDROMORPHONE HCL 1 MG/ML IJ SOLN
1.0000 mg | Freq: Once | INTRAMUSCULAR | Status: AC
Start: 2020-05-28 — End: 2020-05-28
  Administered 2020-05-28: 1 mg via INTRAVENOUS
  Filled 2020-05-28: qty 1

## 2020-05-28 MED ORDER — HYDROMORPHONE HCL 1 MG/ML IJ SOLN
1.0000 mg | Freq: Once | INTRAMUSCULAR | Status: AC
  Administered 2020-05-28: 1 mg via INTRAVENOUS
  Filled 2020-05-28: qty 1

## 2020-05-28 MED ORDER — SODIUM CHLORIDE 0.9 % IV BOLUS
1000.0000 mL | Freq: Once | INTRAVENOUS | Status: AC
  Administered 2020-05-28: 1000 mL via INTRAVENOUS

## 2020-05-28 MED ORDER — ONDANSETRON HCL 4 MG/2ML IJ SOLN
4.0000 mg | Freq: Once | INTRAMUSCULAR | Status: AC
  Administered 2020-05-28: 4 mg via INTRAVENOUS
  Filled 2020-05-28: qty 2

## 2020-05-28 NOTE — ED Provider Notes (Signed)
[facility] EMERGENCY DEPARTMENT Provider Note   CSN: [id number] Arrival date & time: 05/28/20  [id number]     History Chief Complaint  Patient presents with  . Abdominal Pain    [name] is a 45 y.o. male.  He has a history of chronic abdominal pain chronic pancreatitis.  He says is due to prior bile duct surgery.  He denies alcohol.  Complaining of worsening of his chronic pancreatitis pain left upper quadrant radiating into his back.  Severe in nature.  Not responsive to his home oxycodone and Zofran.  Was here a few days ago for same.  Associated some nausea and vomiting.  No blood from above or below.  No fever.  The history is provided by the patient.  Abdominal Pain Pain location:  LUQ Pain quality: aching   Pain radiates to:  Back Pain severity:  Severe Onset quality:  Gradual Timing:  Constant Progression:  Worsening Chronicity:  Recurrent Context: not alcohol use and not trauma   Relieved by:  Nothing Worsened by:  Nothing Ineffective treatments: pain meds. Associated symptoms: nausea and vomiting   Associated symptoms: no chest pain, no constipation, no cough, no dysuria, no fever, no hematemesis, no hematochezia, no hematuria, no shortness of breath and no sore throat        Past Medical History:  Diagnosis Date  . Chronic abdominal pain   . Diverticulitis   . Drug-seeking behavior   . Hypertension   . Kidney stones   . Pancreatitis, chronic ([place]) 05/28/2005    Patient Active Problem List   Diagnosis Date Noted  . Hyperlipidemia, mixed 03/01/2020  . Abnormal magnetic resonance cholangiopancreatography (MRCP)   . Choledocholithiasis   . E coli bacteremia   . Bacteremia due to Enterobacter species   . Bacteremia 12/27/2019  . QT prolongation 12/27/2019  . Nausea without vomiting   . Chronic pain disorder 08/29/2018  . Back pain, chronic 08/29/2018  . Nausea and vomiting in adult 02/25/2018  . Hypertension, essential, benign 01/18/2018   . Chronic biliary pancreatitis ([place]) 01/18/2018  . Chronic abdominal pain 01/14/2018    Past Surgical History:  Procedure Laterality Date  . APPENDECTOMY    . CHOLECYSTECTOMY    . ERCP N/A 12/30/2019   Procedure: ENDOSCOPIC RETROGRADE CHOLANGIOPANCREATOGRAPHY (ERCP);  Surgeon: Milus Banister, MD;  Location: [facility];  Service: Endoscopy;  Laterality: N/A;  . ERCP W/ METAL STENT PLACEMENT    . KIDNEY STONE SURGERY    . REMOVAL OF STONES  12/30/2019   Procedure: REMOVAL OF STONES;  Surgeon: Milus Banister, MD;  Location: [facility];  Service: Endoscopy;;       Family History  Problem Relation Age of Onset  . Cancer Mother   . Early death Mother   . Hypertension Mother   . Heart failure Father   . Hypertension Father   . Heart disease Father   . Hyperlipidemia Father   . Pancreatic cancer Paternal Grandmother        possibly  . Pancreatic cancer Paternal Aunt   . Colon cancer Neg Hx     Social History   Tobacco Use  . Smoking status: Current Every Day Smoker    Packs/day: 0.50    Types: Cigarettes  . Smokeless tobacco: Never Used  Vaping Use  . Vaping Use: Never used  Substance Use Topics  . Alcohol use: Yes    Comment: rare  . Drug use: No    Home Medications Prior to Admission  medications   Medication Sig Start Date End Date Taking? Authorizing Provider  amLODipine-benazepril (LOTREL) 5-20 MG capsule TAKE 1 CAPSULE BY MOUTH EVERY DAY 05/23/20   [place], Betty G, MD  fluticasone [facility]) 50 MCG/ACT nasal spray Place 1 spray into both nostrils 2 (two) times daily. 04/03/19   [place], Betty G, MD  metoprolol succinate (TOPROL-XL) 100 MG 24 hr tablet TAKE 1 TABLET (100 MG TOTAL) BY MOUTH DAILY. TAKE WITH OR IMMEDIATELY FOLLOWING A MEAL. 01/22/20   [place], Betty G, MD  naloxone [facility]) nasal spray 4 mg/0.1 mL 1 spray in each nostril x 1 if opioid overdose. 03/23/19   [place], Betty G, MD  nicotine (NICODERM CQ - DOSED IN MG/24 HOURS) 14 mg/24hr patch Place 1  patch (14 mg total) onto the skin daily. 12/31/19   Rodolph Bong, MD  omeprazole (PRILOSEC) 20 MG capsule Take 1 capsule (20 mg total) by mouth daily. 03/23/19   Horton, Mayer Masker, MD  ondansetron (ZOFRAN ODT) 8 MG disintegrating tablet Take 1 tablet (8 mg total) by mouth every 8 (eight) hours as needed for nausea or vomiting. 06/24/19   [place], Betty G, MD  oxyCODONE ER [facility]) 9 MG C12A Take 1 tablet by mouth 2 (two) times daily. 05/09/20   [place], Betty G, MD  Oxycodone HCl 10 MG TABS 1 tablet twice daily as needed, can take an extra tablet if needed.  No more than 70 tablets/month. 05/02/20   [place], Betty G, MD  promethazine (PHENERGAN) 25 MG tablet TAKE 1 TABLET (25 MG TOTAL) BY MOUTH EVERY 12 (TWELVE) HOURS AS NEEDED. 03/15/20   [place], Betty G, MD    Allergies    Cortisone, Eggs or egg-derived products, Ketorolac, Ketorolac tromethamine, Haldol [haloperidol lactate], Haloperidol, Hydrocortisone, Iodinated diagnostic agents, Ketorolac tromethamine, and Reglan [metoclopramide]  Review of Systems   Review of Systems  Constitutional: Negative for fever.  HENT: Negative for sore throat.   Eyes: Negative for visual disturbance.  Respiratory: Negative for cough and shortness of breath.   Cardiovascular: Negative for chest pain.  Gastrointestinal: Positive for abdominal pain, nausea and vomiting. Negative for constipation, hematemesis and hematochezia.  Genitourinary: Negative for dysuria and hematuria.  Musculoskeletal: Negative for neck pain.  Skin: Negative for rash.  Neurological: Negative for headaches.    Physical Exam Updated Vital Signs BP (!) 150/99   Pulse 73   Temp 98.1 F (36.7 C) (Oral)   Resp 18   Ht 6\' 1"  (1.854 m)   Wt 90.7 kg   SpO2 99%   BMI 26.39 kg/m   Physical Exam Vitals and nursing note reviewed.  Constitutional:      Appearance: Normal appearance. He is well-developed and well-nourished.  HENT:     Head: Normocephalic and atraumatic.  Eyes:      Conjunctiva/sclera: Conjunctivae normal.  Cardiovascular:     Rate and Rhythm: Normal rate and regular rhythm.     Heart sounds: No murmur heard.   Pulmonary:     Effort: Pulmonary effort is normal. No respiratory distress.     Breath sounds: Normal breath sounds.  Abdominal:     Palpations: Abdomen is soft.     Tenderness: There is abdominal tenderness in the left upper quadrant. There is no guarding or rebound.  Musculoskeletal:        General: No deformity, signs of injury or edema. Normal range of motion.     Cervical back: Neck supple.  Skin:    General: Skin is warm and dry.  Neurological:     General: No focal deficit present.     Mental Status: He is alert.  Psychiatric:        Mood and Affect: Mood and affect normal.     ED Results / Procedures / Treatments   Labs (all labs ordered are listed, but only abnormal results are displayed) Labs Reviewed  CBC WITH DIFFERENTIAL/PLATELET - Abnormal; Notable for the following components:      Result Value   WBC 11.9 (*)    Neutro Abs 8.0 (*)    Abs Immature Granulocytes 0.09 (*)    All other components within normal limits  COMPREHENSIVE METABOLIC PANEL - Abnormal; Notable for the following components:   Potassium 3.2 (*)    CO2 21 (*)    Glucose, Bld 105 (*)    All other components within normal limits  LIPASE, BLOOD  URINALYSIS, ROUTINE W REFLEX MICROSCOPIC    EKG EKG Interpretation  Date/Time:  Saturday May 28 2020 09:11:25 EST Ventricular Rate:  63 PR Interval:    QRS Duration: 97 QT Interval:  437 QTC Calculation: 448 R Axis:   40 Text Interpretation: Sinus rhythm Baseline wander in lead(s) II aVF Confirmed by Aletta Edouard [phone]) on 05/28/2020 9:22:50 AM   Radiology No results found.  Procedures Procedures (including critical care time)  Medications Ordered in ED Medications  HYDROmorphone (DILAUDID) injection 1 mg (1 mg Intravenous Given 05/28/20 0911)  sodium chloride 0.9 % bolus 1,000  mL (0 mLs Intravenous Stopped 05/28/20 1025)  HYDROmorphone (DILAUDID) injection 1 mg (1 mg Intravenous Given 05/28/20 1044)  ondansetron (ZOFRAN) injection 4 mg (4 mg Intravenous Given 05/28/20 1043)    ED Course  I have reviewed the triage vital signs and the nursing notes.  Pertinent labs & imaging results that were available during my care of the patient were reviewed by me and considered in my medical decision making (see chart for details).  Clinical Course as of 05/28/20 1712  Sat May 28, 2020  1035 Patient had some improvement in pain with pain medications. Will redose. He is hoping to be able to be discharged and continue with his oral regimen. [MB]  1218 Patient states his pain is better controlled and he is asking for discharge.  We will hold off on imaging has lab work and exam reassuring.  Recommended close follow-up with his GI doctor.  Return instructions discussed [MB]    Clinical Course User Index [MB] Hayden Rasmussen, MD   MDM Rules/Calculators/A&P                         This patient complains of left upper quadrant abdominal pain and nausea; this involves an extensive number of treatment Options and is a complaint that carries with it a high risk of complications and Morbidity. The differential includes gastritis, peptic ulcer disease, pancreatitis, obstruction, UTI chronic pain  I ordered, reviewed and interpreted labs, which included CBC with mildly elevated white count, stable hemoglobin's, chemistry with mildly low potassium and chloride, normal LFTs, normal lipase, urinalysis I ordered medication IV fluids IV nausea medication IV pain medication Previous records obtained and reviewed in epic including recent ED visit few days ago  After the interventions stated above, I reevaluated the patient and found patient to be much more comfortable. He has tolerated p.o. He is comfortable plan for discharge and following up with his GI team. Return instructions  discussed   Final Clinical Impression(s) / ED Diagnoses Final  diagnoses:  Generalized abdominal pain  Chronic pancreatitis, unspecified pancreatitis type [facility])    Rx / DC Orders ED Discharge Orders    None       Hayden Rasmussen, MD 05/28/20 1714

## 2020-05-28 NOTE — ED Triage Notes (Signed)
Pt states chronic pancreatitis, home pain/nausea meds not effective. Has appointment pmd 12, GI follow up 25th.  Last took oxycodone and zofran at 2am with no relief.

## 2020-05-28 NOTE — Discharge Instructions (Addendum)
You were seen in the emergency department for an episode of worsening of your chronic abdominal pain.  You had blood work that did not show any significant abnormalities other than some mildly low potassium.  Your symptoms improved with some pain medicine fluids and nausea medication.  Please continue your routine medications.  Follow-up with your GI doctor and your primary care doctor.  Return to the emergency department if any worsening or concerning symptoms

## 2020-05-31 DIAGNOSIS — R109 Unspecified abdominal pain: Secondary | ICD-10-CM | POA: Diagnosis not present

## 2020-05-31 DIAGNOSIS — Z79899 Other long term (current) drug therapy: Secondary | ICD-10-CM | POA: Diagnosis not present

## 2020-05-31 DIAGNOSIS — R197 Diarrhea, unspecified: Secondary | ICD-10-CM | POA: Diagnosis not present

## 2020-05-31 DIAGNOSIS — K861 Other chronic pancreatitis: Secondary | ICD-10-CM | POA: Diagnosis not present

## 2020-05-31 DIAGNOSIS — F17210 Nicotine dependence, cigarettes, uncomplicated: Secondary | ICD-10-CM | POA: Diagnosis not present

## 2020-05-31 DIAGNOSIS — I10 Essential (primary) hypertension: Secondary | ICD-10-CM | POA: Diagnosis not present

## 2020-05-31 DIAGNOSIS — Z79891 Long term (current) use of opiate analgesic: Secondary | ICD-10-CM | POA: Diagnosis not present

## 2020-05-31 DIAGNOSIS — K2900 Acute gastritis without bleeding: Secondary | ICD-10-CM | POA: Diagnosis not present

## 2020-05-31 DIAGNOSIS — R112 Nausea with vomiting, unspecified: Secondary | ICD-10-CM | POA: Diagnosis not present

## 2020-05-31 DIAGNOSIS — E876 Hypokalemia: Secondary | ICD-10-CM | POA: Diagnosis not present

## 2020-06-01 ENCOUNTER — Ambulatory Visit: Payer: BLUE CROSS/BLUE SHIELD | Admitting: Family Medicine

## 2020-06-01 MED ORDER — OXYCODONE HCL 10 MG PO TABS: ORAL_TABLET | ORAL | 0 refills | Status: DC

## 2020-06-02 ENCOUNTER — Other Ambulatory Visit: Payer: Self-pay | Admitting: Family Medicine

## 2020-06-02 DIAGNOSIS — K861 Other chronic pancreatitis: Secondary | ICD-10-CM

## 2020-06-02 DIAGNOSIS — R112 Nausea with vomiting, unspecified: Secondary | ICD-10-CM

## 2020-06-03 NOTE — Telephone Encounter (Signed)
Last office visit-01/04/20 Last refill- 03/15/20--45 tabs with one refill

## 2020-06-08 ENCOUNTER — Telehealth (INDEPENDENT_AMBULATORY_CARE_PROVIDER_SITE_OTHER): Payer: BLUE CROSS/BLUE SHIELD | Admitting: Family Medicine

## 2020-06-08 ENCOUNTER — Encounter: Payer: Self-pay | Admitting: Family Medicine

## 2020-06-08 VITALS — BP 134/86 | HR 90 | Ht 73.0 in

## 2020-06-08 DIAGNOSIS — F1190 Opioid use, unspecified, uncomplicated: Secondary | ICD-10-CM

## 2020-06-08 DIAGNOSIS — G894 Chronic pain syndrome: Secondary | ICD-10-CM | POA: Diagnosis not present

## 2020-06-08 DIAGNOSIS — R112 Nausea with vomiting, unspecified: Secondary | ICD-10-CM | POA: Diagnosis not present

## 2020-06-08 MED ORDER — XTAMPZA ER 9 MG PO C12A: 1.0000 | EXTENDED_RELEASE_CAPSULE | Freq: Two times a day (BID) | ORAL | 0 refills | Status: DC

## 2020-06-08 MED ORDER — NALOXONE HCL 4 MG/0.1ML NA LIQD: NASAL | 1 refills | Status: DC

## 2020-06-08 NOTE — Progress Notes (Signed)
Virtual Visit via Telephone Note I connected with [name] on 06/08/20 at  8:30 AM EST by telephone and verified that I am speaking with the correct person using two identifiers.   I discussed the limitations, risks, security and privacy concerns of performing an evaluation and management service by telephone and the availability of in person appointments. I also discussed with the patient that there may be a patient responsible charge related to this service. The patient expressed understanding and agreed to proceed.  Location patient: home Location provider: work office Participants present for the call: patient, provider Patient did not have a visit in the prior 7 days to address this/these issue(s).  Chief Complaint  Patient presents with  . Follow-up    Pain management; last drug screen: 09/28/19, last med contract: 09/28/19. PMP report printed & sent to scan.     History of Present Illness: [name] is a 45 yo male following on chronic pain and recent ER visits. Last follow up on 03/01/20. He has been in the ER about 6 times since 02/2020. Main reason for going to the ER is nausea and vomiting, not able to hold medications down. He is on Phenergan 25 mg daily as needed and Zofran 8 mg tid prm. He has phenergan suppositories to use instead oral during acute exacerbations.  Chronic pain disorder: He is on Oxycodone ER 9 mg bid and Oxycodone 10 mg tid prn.  Medications still helping with pain. No side effects.  He has taken medication as instructed, has not needed Narcan nasal spray. Tobacco use: Down to1/4 PPD, trying to decreased tobacco use.  Chronic abdominal pain, last time he saw GI was in 12/2019,Ballard he has been re-scheduled 3 times due to COVID 19 regulations.  Negative for fever,abdomal wt loss, changes in bowel habits,or urinary symptoms.  Observations/Objective: Patient sounds cheerful and well on the phone. I do not appreciate any SOB. Speech and thought  processing are grossly intact. Patient reported vitals:BP 134/86   Pulse 90   Ht 6\' 1"  (1.854 m)   BMI 26.39 kg/m   Assessment and Plan: 1. Chronic, continuous use of opioids Med contract 09/2019. PDMP reviewed. We reviewed current guideline for chronic use of opioids for pain management.  - naloxone (NARCAN) nasal spray 4 mg/0.1 mL; 1 spray in each nostril x 1 if opioid overdose.  Dispense: 1 each; Refill: 1  2. Chronic pain disorder Stable. Continue Oxycodone ER 9 mg bid and Oxycodone 10 mg tid prn. Aware of side effects.  - oxyCODONE ER (XTAMPZA ER) 9 MG C12A; Take 1 tablet by mouth 2 (two) times daily.  Dispense: 60 capsule; Refill: 0  3. Nausea and vomiting in adult Otherwise stable. Gastroparesis most likely. Reglan was poorly tolerated ion the past. Continue Phenergan and Zofran same doses.  Follow Up Instructions: Return in about 4 months (around 10/06/2020) for OV pain and HTN.  I did not refer this patient for an OV in the next 24 hours for this/these issue(s).  I discussed the assessment and treatment plan with the patient. [name] was provided an opportunity to ask questions and all were answered. He agreed with the plan and demonstrated an understanding of the instructions.   I provided 11 minutes of non-face-to-face time during this encounter.   Meenakshi Sazama [place], MD

## 2020-06-10 ENCOUNTER — Encounter: Payer: Self-pay | Admitting: Family Medicine

## 2020-06-21 NOTE — Progress Notes (Signed)
Patient is scheduled for 05/20 at 8:30 AM

## 2020-06-28 ENCOUNTER — Other Ambulatory Visit: Payer: BLUE CROSS/BLUE SHIELD

## 2020-06-28 DIAGNOSIS — Z20822 Contact with and (suspected) exposure to covid-19: Secondary | ICD-10-CM | POA: Diagnosis not present

## 2020-06-29 ENCOUNTER — Other Ambulatory Visit: Payer: Self-pay

## 2020-06-29 ENCOUNTER — Encounter (HOSPITAL_BASED_OUTPATIENT_CLINIC_OR_DEPARTMENT_OTHER): Payer: Self-pay

## 2020-06-29 ENCOUNTER — Emergency Department (HOSPITAL_BASED_OUTPATIENT_CLINIC_OR_DEPARTMENT_OTHER)
Admission: EM | Admit: 2020-06-29 | Discharge: 2020-06-29 | Disposition: A | Discharge status: Home / Self Care | Payer: BLUE CROSS/BLUE SHIELD | Attending: Emergency Medicine | Admitting: Emergency Medicine

## 2020-06-29 ENCOUNTER — Other Ambulatory Visit: Payer: Self-pay | Admitting: Family Medicine

## 2020-06-29 DIAGNOSIS — R1013 Epigastric pain: Secondary | ICD-10-CM | POA: Diagnosis not present

## 2020-06-29 DIAGNOSIS — R109 Unspecified abdominal pain: Secondary | ICD-10-CM

## 2020-06-29 DIAGNOSIS — Z79899 Other long term (current) drug therapy: Secondary | ICD-10-CM | POA: Diagnosis not present

## 2020-06-29 DIAGNOSIS — R112 Nausea with vomiting, unspecified: Secondary | ICD-10-CM | POA: Diagnosis not present

## 2020-06-29 DIAGNOSIS — I10 Essential (primary) hypertension: Secondary | ICD-10-CM | POA: Diagnosis not present

## 2020-06-29 DIAGNOSIS — G894 Chronic pain syndrome: Secondary | ICD-10-CM

## 2020-06-29 DIAGNOSIS — E876 Hypokalemia: Secondary | ICD-10-CM | POA: Diagnosis not present

## 2020-06-29 DIAGNOSIS — F17210 Nicotine dependence, cigarettes, uncomplicated: Secondary | ICD-10-CM | POA: Insufficient documentation

## 2020-06-29 DIAGNOSIS — G8929 Other chronic pain: Secondary | ICD-10-CM

## 2020-06-29 LAB — CBC WITH DIFFERENTIAL/PLATELET
Abs Immature Granulocytes: 0.09 10*3/uL — ABNORMAL HIGH (ref 0.00–0.07)
Basophils Absolute: 0.1 10*3/uL (ref 0.0–0.1)
Basophils Relative: 1 %
Eosinophils Absolute: 0.1 10*3/uL (ref 0.0–0.5)
Eosinophils Relative: 1 %
HCT: 47.7 % (ref 39.0–52.0)
Hemoglobin: 16.7 g/dL (ref 13.0–17.0)
Immature Granulocytes: 1 %
Lymphocytes Relative: 27 %
Lymphs Abs: 3.5 10*3/uL (ref 0.7–4.0)
MCH: 29.9 pg (ref 26.0–34.0)
MCHC: 35 g/dL (ref 30.0–36.0)
MCV: 85.5 fL (ref 80.0–100.0)
Monocytes Absolute: 1.4 10*3/uL — ABNORMAL HIGH (ref 0.1–1.0)
Monocytes Relative: 11 %
Neutro Abs: 7.8 10*3/uL — ABNORMAL HIGH (ref 1.7–7.7)
Neutrophils Relative %: 59 %
Platelets: 242 10*3/uL (ref 150–400)
RBC: 5.58 MIL/uL (ref 4.22–5.81)
RDW: 12.6 % (ref 11.5–15.5)
WBC: 12.9 10*3/uL — ABNORMAL HIGH (ref 4.0–10.5)
nRBC: 0 % (ref 0.0–0.2)

## 2020-06-29 LAB — COMPREHENSIVE METABOLIC PANEL
ALT: 20 U/L (ref 0–44)
AST: 22 U/L (ref 15–41)
Albumin: 4.7 g/dL (ref 3.5–5.0)
Alkaline Phosphatase: 67 U/L (ref 38–126)
Anion gap: 12 (ref 5–15)
BUN: 13 mg/dL (ref 6–20)
CO2: 22 mmol/L (ref 22–32)
Calcium: 9.8 mg/dL (ref 8.9–10.3)
Chloride: 106 mmol/L (ref 98–111)
Creatinine, Ser: 0.83 mg/dL (ref 0.61–1.24)
GFR, Estimated: >60 mL/min (ref >60)
Glucose, Bld: 107 mg/dL — ABNORMAL HIGH (ref 70–99)
Potassium: 3.2 mmol/L — ABNORMAL LOW (ref 3.5–5.1)
Sodium: 140 mmol/L (ref 135–145)
Total Bilirubin: 0.7 mg/dL (ref 0.3–1.2)
Total Protein: 7.7 g/dL (ref 6.5–8.1)

## 2020-06-29 LAB — LIPASE, BLOOD: Lipase: 42 U/L (ref 11–51)

## 2020-06-29 MED ORDER — PROMETHAZINE HCL 25 MG RE SUPP: 25.0000 mg | Freq: Four times a day (QID) | RECTAL | 0 refills | Status: DC | PRN

## 2020-06-29 MED ORDER — ONDANSETRON HCL 4 MG/2ML IJ SOLN
4.0000 mg | Freq: Once | INTRAMUSCULAR | Status: AC
  Administered 2020-06-29: 4 mg via INTRAVENOUS
  Filled 2020-06-29: qty 2

## 2020-06-29 MED ORDER — HYDROMORPHONE HCL 1 MG/ML IJ SOLN
1.0000 mg | Freq: Once | INTRAMUSCULAR | Status: AC
  Administered 2020-06-29: 1 mg via INTRAVENOUS
  Filled 2020-06-29: qty 1

## 2020-06-29 MED ORDER — PROMETHAZINE HCL 25 MG/ML IJ SOLN
12.5000 mg | Freq: Once | INTRAMUSCULAR | Status: AC | PRN
  Administered 2020-06-29: 12.5 mg via INTRAVENOUS
  Filled 2020-06-29: qty 1

## 2020-06-29 MED ORDER — LACTATED RINGERS IV BOLUS
1000.0000 mL | Freq: Once | INTRAVENOUS | Status: AC
  Administered 2020-06-29: 1000 mL via INTRAVENOUS

## 2020-06-29 MED ORDER — ONDANSETRON 4 MG PO TBDP: ORAL_TABLET | ORAL | 0 refills | Status: DC

## 2020-06-29 MED ORDER — POTASSIUM CHLORIDE CRYS ER 20 MEQ PO TBCR
40.0000 meq | EXTENDED_RELEASE_TABLET | Freq: Once | ORAL | Status: AC
  Administered 2020-06-29: 40 meq via ORAL
  Filled 2020-06-29: qty 2

## 2020-06-29 MED ORDER — OXYCODONE HCL 10 MG PO TABS
ORAL_TABLET | ORAL | 0 refills | Status: DC
Start: 2020-06-29 — End: 2020-07-26

## 2020-06-29 NOTE — ED Triage Notes (Signed)
Pt here POV from Home with N/V and ABD Pain 2-3 days ago intermittently, consistently since 2100 06/28/20.  Hx of Pancreatitis.  Pt took Oxycodone 10 mg and Phenergan 25 mg PO PTA at 2200 with minimal relief.   A&Ox4, GCS 15

## 2020-06-29 NOTE — Telephone Encounter (Signed)
Last filled 06/02/20  

## 2020-06-29 NOTE — Discharge Instructions (Signed)
Follow up with your GI doc.  Return for worsening pain, fever, inability to eat or drink.

## 2020-06-29 NOTE — ED Provider Notes (Signed)
[facility] Provider Note: Georgena Spurling, MD, FACEP  CSN: [id number] MRN: [id number] ARRIVAL: 06/29/20 at 0527 ROOM: [place]  Abdominal Pain and Vomiting   HISTORY OF PRESENT ILLNESS  06/29/20 5:49 AM [name] is a 45 y.o. male with a history of pancreatitis on chronic narcotic pain management.  He has episodes where his pain and nausea are not controlled with his home medications.  He had such an episode began about 2 days ago where he became nauseated and was unable to hold down his usual medications.  He last took Phenergan 25 mg and oxycodone 10 mg yesterday evening about 9:30 PM without adequate relief of his symptoms.  He is having epigastric pain which he rates as a 7 out of 10.  It is worse with palpation or movement.  It is characterized as like previous pancreatitis pain.  He has not had diarrhea or fever with this.   Past Medical History:  Diagnosis Date  . Chronic abdominal pain   . Diverticulitis   . Drug-seeking behavior   . Hypertension   . Kidney stones   . Pancreatitis, chronic ([place]) 05/28/2005    Past Surgical History:  Procedure Laterality Date  . APPENDECTOMY    . CHOLECYSTECTOMY    . ERCP N/A 12/30/2019   Procedure: ENDOSCOPIC RETROGRADE CHOLANGIOPANCREATOGRAPHY (ERCP);  Surgeon: Milus Banister, MD;  Location: [facility];  Service: Endoscopy;  Laterality: N/A;  . ERCP W/ METAL STENT PLACEMENT    . KIDNEY STONE SURGERY    . REMOVAL OF STONES  12/30/2019   Procedure: REMOVAL OF STONES;  Surgeon: Milus Banister, MD;  Location: [facility];  Service: Endoscopy;;    Family History  Problem Relation Age of Onset  . Cancer Mother   . Early death Mother   . Hypertension Mother   . Heart failure Father   . Hypertension Father   . Heart disease Father   . Hyperlipidemia Father   . Pancreatic cancer Paternal Grandmother        possibly  . Pancreatic cancer Paternal Aunt   . Colon cancer Neg Hx     Social  History   Tobacco Use  . Smoking status: Current Every Day Smoker    Packs/day: 0.50    Types: Cigarettes  . Smokeless tobacco: Never Used  Vaping Use  . Vaping Use: Never used  Substance Use Topics  . Alcohol use: Yes    Comment: rare  . Drug use: No    Prior to Admission medications   Medication Sig Start Date End Date Taking? Authorizing Provider  amLODipine-benazepril (LOTREL) 5-20 MG capsule TAKE 1 CAPSULE BY MOUTH EVERY DAY 05/23/20  Yes [place], Betty G, MD  fluticasone [facility]) 50 MCG/ACT nasal spray Place 1 spray into both nostrils 2 (two) times daily. 04/03/19  Yes [place], Betty G, MD  metoprolol succinate (TOPROL-XL) 100 MG 24 hr tablet TAKE 1 TABLET (100 MG TOTAL) BY MOUTH DAILY. TAKE WITH OR IMMEDIATELY FOLLOWING A MEAL. 01/22/20  Yes [place], Betty G, MD  ondansetron (ZOFRAN ODT) 8 MG disintegrating tablet Take 1 tablet (8 mg total) by mouth every 8 (eight) hours as needed for nausea or vomiting. 06/24/19  Yes [place], Betty G, MD  oxyCODONE ER [facility]) 9 MG C12A Take 1 tablet by mouth 2 (two) times daily. 06/08/20  Yes [place], Betty G, MD  Oxycodone HCl 10 MG TABS 1 tablet twice daily as needed, can take an extra tablet if needed.  No more than 70 tablets/month. 06/01/20  Yes [place], Betty G, MD  promethazine (PHENERGAN) 25 MG tablet Take 1 tablet (25 mg total) by mouth daily as needed for nausea or vomiting. 06/08/20  Yes [place], Betty G, MD  naloxone [facility]) nasal spray 4 mg/0.1 mL 1 spray in each nostril x 1 if opioid overdose. 06/08/20   [place], Betty G, MD  nicotine (NICODERM CQ - DOSED IN MG/24 HOURS) 14 mg/24hr patch Place 1 patch (14 mg total) onto the skin daily. 12/31/19   Eugenie Filler, MD    Allergies Cortisone, Eggs or egg-derived products, Ketorolac, Ketorolac tromethamine, Haldol [haloperidol lactate], Haloperidol, Hydrocortisone, Iodinated diagnostic agents, Ketorolac tromethamine, and Reglan [metoclopramide]   REVIEW OF SYSTEMS  Negative except as noted  here or in the History of Present Illness.   PHYSICAL EXAMINATION  Initial Vital Signs Blood pressure (!) 160/100, pulse 74, temperature 98.2 F (36.8 C), temperature source Oral, resp. rate 16, height 6\' 1"  (1.854 m), weight 93 kg, SpO2 100 %.  Examination General: Well-developed, well-nourished male in no acute distress; appearance consistent with age of record HENT: normocephalic; atraumatic Eyes: pupils equal, round and reactive to light; extraocular muscles intact Neck: supple Heart: regular rate and rhythm Lungs: clear to auscultation bilaterally Abdomen: soft; nondistended; epigastric tenderness; bowel sounds hypoactive Extremities: No deformity; full range of motion; pulses normal Neurologic: Awake, alert and oriented; motor function intact in all extremities and symmetric; no facial droop Skin: Warm and dry Psychiatric: Normal mood and affect   RESULTS  Summary of this visit's results, reviewed and interpreted by myself:   EKG Interpretation  Date/Time:    Ventricular Rate:    PR Interval:    QRS Duration:   QT Interval:    QTC Calculation:   R Axis:     Text Interpretation:        Laboratory Studies: Results for orders placed or performed during the hospital encounter of 06/29/20 (from the past 24 hour(s))  CBC with Differential/Platelet     Status: Abnormal   Collection Time: 06/29/20  6:07 AM  Result Value Ref Range   WBC 12.9 (H) 4.0 - 10.5 K/uL   RBC 5.58 4.22 - 5.81 MIL/uL   Hemoglobin 16.7 13.0 - 17.0 g/dL   HCT 47.7 39.0 - 52.0 %   MCV 85.5 80.0 - 100.0 fL   MCH 29.9 26.0 - 34.0 pg   MCHC 35.0 30.0 - 36.0 g/dL   RDW 12.6 11.5 - 15.5 %   Platelets 242 150 - 400 K/uL   nRBC 0.0 0.0 - 0.2 %   Neutrophils Relative % 59 %   Neutro Abs 7.8 (H) 1.7 - 7.7 K/uL   Lymphocytes Relative 27 %   Lymphs Abs 3.5 0.7 - 4.0 K/uL   Monocytes Relative 11 %   Monocytes Absolute 1.4 (H) 0.1 - 1.0 K/uL   Eosinophils Relative 1 %   Eosinophils Absolute 0.1 0.0 -  0.5 K/uL   Basophils Relative 1 %   Basophils Absolute 0.1 0.0 - 0.1 K/uL   Immature Granulocytes 1 %   Abs Immature Granulocytes 0.09 (H) 0.00 - 0.07 K/uL  Comprehensive metabolic panel     Status: Abnormal   Collection Time: 06/29/20  6:07 AM  Result Value Ref Range   Sodium 140 135 - 145 mmol/L   Potassium 3.2 (L) 3.5 - 5.1 mmol/L   Chloride 106 98 - 111 mmol/L   CO2 22 22 - 32 mmol/L   Glucose, Bld 107 (H)  70 - 99 mg/dL   BUN 13 6 - 20 mg/dL   Creatinine, Ser 0.83 0.61 - 1.24 mg/dL   Calcium 9.8 8.9 - 10.3 mg/dL   Total Protein 7.7 6.5 - 8.1 g/dL   Albumin 4.7 3.5 - 5.0 g/dL   AST 22 15 - 41 U/L   ALT 20 0 - 44 U/L   Alkaline Phosphatase 67 38 - 126 U/L   Total Bilirubin 0.7 0.3 - 1.2 mg/dL   GFR, Estimated >60 >60 mL/min   Anion gap 12 5 - 15  Lipase, blood     Status: None   Collection Time: 06/29/20  6:07 AM  Result Value Ref Range   Lipase 42 11 - 51 U/L   Imaging Studies: No results found.  ED COURSE and MDM  Nursing notes, initial and subsequent vitals signs, including pulse oximetry, reviewed and interpreted by myself.  Vitals:   06/29/20 0537 06/29/20 0538  BP: (!) 160/100   Pulse: 74   Resp: 16   Temp: 98.2 F (36.8 C)   TempSrc: Oral   SpO2: 100%   Weight:  93 kg  Height:  6\' 1"  (1.854 m)   Medications  promethazine (PHENERGAN) injection 12.5 mg (has no administration in time range)  HYDROmorphone (DILAUDID) injection 1 mg (has no administration in time range)  potassium chloride SA (KLOR-CON) CR tablet 40 mEq (has no administration in time range)  lactated ringers bolus 1,000 mL (1,000 mLs Intravenous New Bag/Given 06/29/20 0603)  ondansetron (ZOFRAN) injection 4 mg (4 mg Intravenous Given 06/29/20 0603)  HYDROmorphone (DILAUDID) injection 1 mg (1 mg Intravenous Given 06/29/20 0604)    Unable to access PMPAware database due to server error.   6:43 AM Patient's nausea and pain not completely improved.  Phenergan and Toradol ordered.  Potassium  ordered for hypokalemia.  7:00 AM Signed out to AM EDP.   PROCEDURES  Procedures   ED DIAGNOSES     ICD-10-CM   1. Epigastric pain  R10.13   2. Nausea and vomiting in adult  R11.2   3. Hypokalemia due to loss of potassium  E87.6        Raechelle Sarti, Jenny Reichmann, MD 06/29/20 [id number]

## 2020-06-29 NOTE — ED Provider Notes (Signed)
Received the patient in signout from Dr. Florina Ou.  Briefly the patient is a 45 year old male with a history of chronic pancreatitis here with the same.  Blood work is unremarkable getting a second dose of pain medicine plan to reassess.  Feeling better on reassessment requesting discharge home.  Tolerating p.o. here.  PCP follow-up.   Deno Etienne, DO 06/29/20 [phone]

## 2020-07-07 ENCOUNTER — Other Ambulatory Visit: Payer: Self-pay | Admitting: Family Medicine

## 2020-07-07 DIAGNOSIS — G894 Chronic pain syndrome: Secondary | ICD-10-CM

## 2020-07-08 MED ORDER — XTAMPZA ER 9 MG PO C12A: 1.0000 | EXTENDED_RELEASE_CAPSULE | Freq: Two times a day (BID) | ORAL | 0 refills | Status: DC

## 2020-07-08 NOTE — Telephone Encounter (Signed)
Last filled 06/10/20

## 2020-07-20 DIAGNOSIS — Z888 Allergy status to other drugs, medicaments and biological substances status: Secondary | ICD-10-CM | POA: Diagnosis not present

## 2020-07-20 DIAGNOSIS — I10 Essential (primary) hypertension: Secondary | ICD-10-CM | POA: Diagnosis not present

## 2020-07-20 DIAGNOSIS — K861 Other chronic pancreatitis: Secondary | ICD-10-CM | POA: Diagnosis not present

## 2020-07-20 DIAGNOSIS — R109 Unspecified abdominal pain: Secondary | ICD-10-CM | POA: Diagnosis not present

## 2020-07-20 DIAGNOSIS — R1013 Epigastric pain: Secondary | ICD-10-CM | POA: Diagnosis not present

## 2020-07-20 DIAGNOSIS — R112 Nausea with vomiting, unspecified: Secondary | ICD-10-CM | POA: Diagnosis not present

## 2020-07-24 DIAGNOSIS — R112 Nausea with vomiting, unspecified: Secondary | ICD-10-CM | POA: Diagnosis not present

## 2020-07-24 DIAGNOSIS — K861 Other chronic pancreatitis: Secondary | ICD-10-CM | POA: Diagnosis not present

## 2020-07-26 ENCOUNTER — Encounter (HOSPITAL_BASED_OUTPATIENT_CLINIC_OR_DEPARTMENT_OTHER): Payer: Self-pay | Admitting: Emergency Medicine

## 2020-07-26 ENCOUNTER — Other Ambulatory Visit: Payer: Self-pay

## 2020-07-26 ENCOUNTER — Telehealth (INDEPENDENT_AMBULATORY_CARE_PROVIDER_SITE_OTHER): Payer: BLUE CROSS/BLUE SHIELD | Admitting: Family Medicine

## 2020-07-26 ENCOUNTER — Other Ambulatory Visit: Payer: Self-pay | Admitting: Family Medicine

## 2020-07-26 ENCOUNTER — Encounter: Payer: Self-pay | Admitting: Family Medicine

## 2020-07-26 ENCOUNTER — Emergency Department (HOSPITAL_BASED_OUTPATIENT_CLINIC_OR_DEPARTMENT_OTHER)
Admission: EM | Admit: 2020-07-26 | Discharge: 2020-07-26 | Disposition: A | Discharge status: Home / Self Care | Payer: BLUE CROSS/BLUE SHIELD | Attending: Emergency Medicine | Admitting: Emergency Medicine

## 2020-07-26 VITALS — Ht 73.0 in

## 2020-07-26 DIAGNOSIS — K861 Other chronic pancreatitis: Secondary | ICD-10-CM | POA: Insufficient documentation

## 2020-07-26 DIAGNOSIS — R112 Nausea with vomiting, unspecified: Secondary | ICD-10-CM

## 2020-07-26 DIAGNOSIS — G894 Chronic pain syndrome: Secondary | ICD-10-CM | POA: Diagnosis not present

## 2020-07-26 DIAGNOSIS — Z79899 Other long term (current) drug therapy: Secondary | ICD-10-CM | POA: Insufficient documentation

## 2020-07-26 DIAGNOSIS — F17210 Nicotine dependence, cigarettes, uncomplicated: Secondary | ICD-10-CM | POA: Insufficient documentation

## 2020-07-26 DIAGNOSIS — R1013 Epigastric pain: Secondary | ICD-10-CM | POA: Diagnosis not present

## 2020-07-26 DIAGNOSIS — I10 Essential (primary) hypertension: Secondary | ICD-10-CM | POA: Diagnosis not present

## 2020-07-26 DIAGNOSIS — K851 Biliary acute pancreatitis without necrosis or infection: Secondary | ICD-10-CM | POA: Diagnosis not present

## 2020-07-26 DIAGNOSIS — G8929 Other chronic pain: Secondary | ICD-10-CM

## 2020-07-26 LAB — CBC WITH DIFFERENTIAL/PLATELET
Abs Immature Granulocytes: 0.12 10*3/uL — ABNORMAL HIGH (ref 0.00–0.07)
Basophils Absolute: 0.1 10*3/uL (ref 0.0–0.1)
Basophils Relative: 1 %
Eosinophils Absolute: 0.3 10*3/uL (ref 0.0–0.5)
Eosinophils Relative: 2 %
HCT: 42.5 % (ref 39.0–52.0)
Hemoglobin: 15.3 g/dL (ref 13.0–17.0)
Immature Granulocytes: 1 %
Lymphocytes Relative: 21 %
Lymphs Abs: 2.8 10*3/uL (ref 0.7–4.0)
MCH: 30.2 pg (ref 26.0–34.0)
MCHC: 36 g/dL (ref 30.0–36.0)
MCV: 84 fL (ref 80.0–100.0)
Monocytes Absolute: 1.2 10*3/uL — ABNORMAL HIGH (ref 0.1–1.0)
Monocytes Relative: 9 %
Neutro Abs: 8.7 10*3/uL — ABNORMAL HIGH (ref 1.7–7.7)
Neutrophils Relative %: 66 %
Platelets: 267 10*3/uL (ref 150–400)
RBC: 5.06 MIL/uL (ref 4.22–5.81)
RDW: 12.2 % (ref 11.5–15.5)
WBC: 13.2 10*3/uL — ABNORMAL HIGH (ref 4.0–10.5)
nRBC: 0 % (ref 0.0–0.2)

## 2020-07-26 LAB — COMPREHENSIVE METABOLIC PANEL
ALT: 22 U/L (ref 0–44)
AST: 20 U/L (ref 15–41)
Albumin: 4 g/dL (ref 3.5–5.0)
Alkaline Phosphatase: 67 U/L (ref 38–126)
Anion gap: 10 (ref 5–15)
BUN: 17 mg/dL (ref 6–20)
CO2: 22 mmol/L (ref 22–32)
Calcium: 9.2 mg/dL (ref 8.9–10.3)
Chloride: 105 mmol/L (ref 98–111)
Creatinine, Ser: 0.85 mg/dL (ref 0.61–1.24)
GFR, Estimated: >60 mL/min (ref >60)
Glucose, Bld: 107 mg/dL — ABNORMAL HIGH (ref 70–99)
Potassium: 3.5 mmol/L (ref 3.5–5.1)
Sodium: 137 mmol/L (ref 135–145)
Total Bilirubin: 0.4 mg/dL (ref 0.3–1.2)
Total Protein: 7.1 g/dL (ref 6.5–8.1)

## 2020-07-26 LAB — LIPASE, BLOOD: Lipase: 39 U/L (ref 11–51)

## 2020-07-26 MED ORDER — PROMETHAZINE HCL 25 MG/ML IJ SOLN
INTRAMUSCULAR | Status: AC
  Administered 2020-07-26: 25 mg via INTRAVENOUS
  Filled 2020-07-26: qty 1

## 2020-07-26 MED ORDER — LACTATED RINGERS IV BOLUS
1000.0000 mL | Freq: Once | INTRAVENOUS | Status: AC
  Administered 2020-07-26: 1000 mL via INTRAVENOUS

## 2020-07-26 MED ORDER — HYDROMORPHONE HCL 1 MG/ML IJ SOLN
1.0000 mg | Freq: Once | INTRAMUSCULAR | Status: AC
  Administered 2020-07-26: 1 mg via INTRAVENOUS
  Filled 2020-07-26: qty 1

## 2020-07-26 MED ORDER — PROMETHAZINE HCL 25 MG/ML IJ SOLN: 25.0000 mg | Freq: Once | INTRAMUSCULAR | Status: AC

## 2020-07-26 NOTE — ED Triage Notes (Signed)
PT state having Pancreatitis attack starting at midnight. Unable to take meds at home due to emesis.

## 2020-07-26 NOTE — ED Provider Notes (Signed)
[place] DEPT [facility] Provider Note: Leonard Spurling, MD, FACEP  CSN: [id number] MRN: [id number] ARRIVAL: 07/26/20 at [place]: [id number]   CHIEF COMPLAINT  Pancreatitis   HISTORY OF PRESENT ILLNESS  07/26/20 4:15 AM [name] is a 45 y.o. male with chronic pancreatitis on chronic oxycodone therapy.  He is here with epigastric pain that began about midnight.  He rates the pain as severe and characterizes it as like previous pancreatitis.  It is worse with palpation or movement.  He has had nausea and vomiting and has not been able to take his home medications.  He was seen for the same thing in another ED 2 days ago but was discharged with improvement.  His laboratory studies at that time were unremarkable.   Past Medical History:  Diagnosis Date  . Chronic abdominal pain   . Diverticulitis   . Drug-seeking behavior   . Hypertension   . Kidney stones   . Pancreatitis, chronic ([place]) 05/28/2005    Past Surgical History:  Procedure Laterality Date  . APPENDECTOMY    . CHOLECYSTECTOMY    . ERCP N/A 12/30/2019   Procedure: ENDOSCOPIC RETROGRADE CHOLANGIOPANCREATOGRAPHY (ERCP);  Surgeon: Milus Banister, MD;  Location: [facility];  Service: Endoscopy;  Laterality: N/A;  . ERCP W/ METAL STENT PLACEMENT    . KIDNEY STONE SURGERY    . REMOVAL OF STONES  12/30/2019   Procedure: REMOVAL OF STONES;  Surgeon: Milus Banister, MD;  Location: [facility];  Service: Endoscopy;;    Family History  Problem Relation Age of Onset  . Cancer Mother   . Early death Mother   . Hypertension Mother   . Heart failure Father   . Hypertension Father   . Heart disease Father   . Hyperlipidemia Father   . Pancreatic cancer Paternal Grandmother        possibly  . Pancreatic cancer Paternal Aunt   . Colon cancer Neg Hx     Social History   Tobacco Use  . Smoking status: Current Every Day Smoker    Packs/day: 0.50    Types: Cigarettes  . Smokeless tobacco: Never Used  Vaping Use   . Vaping Use: Never used  Substance Use Topics  . Alcohol use: Yes    Comment: rare  . Drug use: No    Prior to Admission medications   Medication Sig Start Date End Date Taking? Authorizing Provider  oxyCODONE ER (XTAMPZA ER) 9 MG C12A Take 1 tablet by mouth 2 (two) times daily. 07/08/20   [place], Betty G, MD  amLODipine-benazepril (LOTREL) 5-20 MG capsule TAKE 1 CAPSULE BY MOUTH EVERY DAY 05/23/20   [place], Betty G, MD  fluticasone [facility]) 50 MCG/ACT nasal spray Place 1 spray into both nostrils 2 (two) times daily. 04/03/19   [place], Betty G, MD  metoprolol succinate (TOPROL-XL) 100 MG 24 hr tablet TAKE 1 TABLET (100 MG TOTAL) BY MOUTH DAILY. TAKE WITH OR IMMEDIATELY FOLLOWING A MEAL. 01/22/20   [place], Betty G, MD  naloxone [facility]) nasal spray 4 mg/0.1 mL 1 spray in each nostril x 1 if opioid overdose. 06/08/20   [place], Betty G, MD  nicotine (NICODERM CQ - DOSED IN MG/24 HOURS) 14 mg/24hr patch Place 1 patch (14 mg total) onto the skin daily. 12/31/19   Eugenie Filler, MD  ondansetron (ZOFRAN ODT) 4 MG disintegrating tablet 4mg  ODT q4 hours prn nausea/vomit 06/29/20   Deno Etienne, DO  ondansetron (ZOFRAN ODT) 8 MG disintegrating tablet Take 1  tablet (8 mg total) by mouth every 8 (eight) hours as needed for nausea or vomiting. 06/24/19   [place], Betty G, MD  Oxycodone HCl 10 MG TABS 1 tablet twice daily as needed, can take an extra tablet if needed.  No more than 70 tablets/month. 06/29/20   [place], Betty G, MD  promethazine (PHENERGAN) 25 MG suppository Place 1 suppository (25 mg total) rectally every 6 (six) hours as needed for nausea or vomiting. 06/29/20   Deno Etienne, DO  promethazine (PHENERGAN) 25 MG tablet Take 1 tablet (25 mg total) by mouth daily as needed for nausea or vomiting. 06/08/20   [place], Betty G, MD    Allergies Cortisone, Eggs or egg-derived products, Ketorolac, Ketorolac tromethamine, Haldol [haloperidol lactate], Haloperidol, Hydrocortisone, Iodinated diagnostic agents,  Ketorolac tromethamine, and Reglan [metoclopramide]   REVIEW OF SYSTEMS  Negative except as noted here or in the History of Present Illness.   PHYSICAL EXAMINATION  Initial Vital Signs Blood pressure (!) 154/117, pulse 89, temperature 98.2 F (36.8 C), temperature source Oral, resp. rate 18, height 6\' 1"  (1.854 m), weight 93 kg, SpO2 98 %.  Examination General: Well-developed, well-nourished male in no acute distress; appearance consistent with age of record HENT: normocephalic; atraumatic Eyes: pupils equal, round and reactive to light; extraocular muscles intact Neck: supple Heart: regular rate and rhythm Lungs: clear to auscultation bilaterally Abdomen: soft; nondistended; epigastric tenderness; bowel sounds present Extremities: No deformity; full range of motion; pulses normal Neurologic: Awake, alert and oriented; motor function intact in all extremities and symmetric; no facial droop Skin: Warm and dry Psychiatric: Flat affect   RESULTS  Summary of this visit's results, reviewed and interpreted by myself:   EKG Interpretation  Date/Time:    Ventricular Rate:    PR Interval:    QRS Duration:   QT Interval:    QTC Calculation:   R Axis:     Text Interpretation:        Laboratory Studies: Results for orders placed or performed during the hospital encounter of 07/26/20 (from the past 24 hour(s))  CBC with Differential/Platelet     Status: Abnormal   Collection Time: 07/26/20  4:21 AM  Result Value Ref Range   WBC 13.2 (H) 4.0 - 10.5 K/uL   RBC 5.06 4.22 - 5.81 MIL/uL   Hemoglobin 15.3 13.0 - 17.0 g/dL   HCT 42.5 39.0 - 52.0 %   MCV 84.0 80.0 - 100.0 fL   MCH 30.2 26.0 - 34.0 pg   MCHC 36.0 30.0 - 36.0 g/dL   RDW 12.2 11.5 - 15.5 %   Platelets 267 150 - 400 K/uL   nRBC 0.0 0.0 - 0.2 %   Neutrophils Relative % 66 %   Neutro Abs 8.7 (H) 1.7 - 7.7 K/uL   Lymphocytes Relative 21 %   Lymphs Abs 2.8 0.7 - 4.0 K/uL   Monocytes Relative 9 %   Monocytes Absolute  1.2 (H) 0.1 - 1.0 K/uL   Eosinophils Relative 2 %   Eosinophils Absolute 0.3 0.0 - 0.5 K/uL   Basophils Relative 1 %   Basophils Absolute 0.1 0.0 - 0.1 K/uL   Immature Granulocytes 1 %   Abs Immature Granulocytes 0.12 (H) 0.00 - 0.07 K/uL  Comprehensive metabolic panel     Status: Abnormal   Collection Time: 07/26/20  4:21 AM  Result Value Ref Range   Sodium 137 135 - 145 mmol/L   Potassium 3.5 3.5 - 5.1 mmol/L   Chloride 105 98 - 111  mmol/L   CO2 22 22 - 32 mmol/L   Glucose, Bld 107 (H) 70 - 99 mg/dL   BUN 17 6 - 20 mg/dL   Creatinine, Ser 0.85 0.61 - 1.24 mg/dL   Calcium 9.2 8.9 - 10.3 mg/dL   Total Protein 7.1 6.5 - 8.1 g/dL   Albumin 4.0 3.5 - 5.0 g/dL   AST 20 15 - 41 U/L   ALT 22 0 - 44 U/L   Alkaline Phosphatase 67 38 - 126 U/L   Total Bilirubin 0.4 0.3 - 1.2 mg/dL   GFR, Estimated >60 >60 mL/min   Anion gap 10 5 - 15  Lipase, blood     Status: None   Collection Time: 07/26/20  4:21 AM  Result Value Ref Range   Lipase 39 11 - 51 U/L   Imaging Studies: No results found.  ED COURSE and MDM  Nursing notes, initial and subsequent vitals signs, including pulse oximetry, reviewed and interpreted by myself.  Vitals:   07/26/20 0410 07/26/20 0411 07/26/20 0535 07/26/20 0600  BP: (!) 154/117  (!) 152/102 (!) 148/108  Pulse: 89  74 77  Resp: 18  16   Temp: 98.2 F (36.8 C)     TempSrc: Oral     SpO2: 98%  94% 97%  Weight:  93 kg    Height:  6\' 1"  (1.854 m)     Medications  lactated ringers bolus 1,000 mL ( Intravenous Stopped 07/26/20 0542)  promethazine (PHENERGAN) injection 25 mg (25 mg Intravenous Given 07/26/20 0449)  HYDROmorphone (DILAUDID) injection 1 mg (1 mg Intravenous Given 07/26/20 0449)  promethazine (PHENERGAN) injection 25 mg (25 mg Intravenous Given 07/26/20 0540)  HYDROmorphone (DILAUDID) injection 1 mg (1 mg Intravenous Given 07/26/20 0540)   6:24 AM Patient feeling significantly better, states he is ready to go home.  As noted above he is already on  chronic home narcotics.   PROCEDURES  Procedures   ED DIAGNOSES     ICD-10-CM   1. Chronic biliary pancreatitis ([place])  K86.1        Kenzel Ruesch, Jenny Reichmann, MD 07/26/20 [phone]

## 2020-07-26 NOTE — Progress Notes (Signed)
Virtual Visit via Video Note I connected with [name] on 07/26/20 by a video enabled telemedicine application and verified that I am speaking with the correct person using two identifiers.  Location patient: home Location provider:work office Persons participating in the virtual visit: patient, provider  I discussed the limitations of evaluation and management by telemedicine and the availability of in person appointments. The patient expressed understanding and agreed to proceed.  Chief Complaint  Patient presents with  . Follow-up   HPI: [name] is a 45 year old male with history of hypertension and chronic pain who is following on recent ER visit. He was seen in the ER today. He presented to the ER complaining of epigastric pain that began around midnight associated with nausea and vomiting. Also seen in the ER on 07/24/2020 when he was evaluated for 5 hours of epigastric abdominal pain, sharp/stabbing, severe with associated nausea and vomiting. Negative for changes in bowel habits, melena, or blood in the stool. He has not changed dietary habits. He has not identified exacerbating or alleviating factors.  Lab Results  Component Value Date   ALT 22 07/26/2020   AST 20 07/26/2020   ALKPHOS 67 07/26/2020   BILITOT 0.4 07/26/2020   Lab Results  Component Value Date   WBC 13.2 (H) 07/26/2020   HGB 15.3 07/26/2020   HCT 42.5 07/26/2020   MCV 84.0 07/26/2020   PLT 267 07/26/2020   Last month he was in the ER twice. He is on chronic pain management for abdominal pain, thought to be related to chronic pancreatitis.  He has been evaluated by gastroenterologist. At some point it was thought by GI that abdominal pain could be radicular, MRI studies were not done. He is on oxycodone ER 9 mg every 12 hours and oxycodone 10 mg 3 times daily as needed.  He feels like oxycodone ER is helping but the short acting is not, he wonders if dose can be increased.  For nausea and vomiting he  is on Phenergan 25 mg daily as needed and Zofran.  We have discussed the possibility of gastroparesis, he has not tolerated Reglan in the past.  Urine tox on 09/28/19 was positive for benzodiazepines, lorazepam.  He denies taking this medication, he thinks he was given to him during ER visit due to facial edema to help him relax.  BP also elevated at 154/117. Currently he is on metoprolol succinate 100 mg daily and amlodipine-benazepril 5-20 mg daily. He is not checking BP at home. Negative for unusual headache, visual changes, CP, palpitations, gross hematuria, decreased urine output, or foam in the urine. He has not noted focal neurologic deficit or edema. Lab Results  Component Value Date   CREATININE 0.85 07/26/2020   BUN 17 07/26/2020   NA 137 07/26/2020   K 3.5 07/26/2020   CL 105 07/26/2020   CO2 22 07/26/2020   ROS: See pertinent positives and negatives per HPI.  Past Medical History:  Diagnosis Date  . Chronic abdominal pain   . Diverticulitis   . Drug-seeking behavior   . Hypertension   . Kidney stones   . Pancreatitis, chronic ([place]) 05/28/2005   Past Surgical History:  Procedure Laterality Date  . APPENDECTOMY    . CHOLECYSTECTOMY    . ERCP N/A 12/30/2019   Procedure: ENDOSCOPIC RETROGRADE CHOLANGIOPANCREATOGRAPHY (ERCP);  Surgeon: Milus Banister, MD;  Location: [facility];  Service: Endoscopy;  Laterality: N/A;  . ERCP W/ METAL STENT PLACEMENT    . KIDNEY STONE SURGERY    .  REMOVAL OF STONES  12/30/2019   Procedure: REMOVAL OF STONES;  Surgeon: Milus Banister, MD;  Location: [facility];  Service: Endoscopy;;   Family History  Problem Relation Age of Onset  . Cancer Mother   . Early death Mother   . Hypertension Mother   . Heart failure Father   . Hypertension Father   . Heart disease Father   . Hyperlipidemia Father   . Pancreatic cancer Paternal Grandmother        possibly  . Pancreatic cancer Paternal Aunt   . Colon cancer Neg Hx    Social History    Socioeconomic History  . Marital status: Married    Spouse name: Not on file  . Number of children: Not on file  . Years of education: Not on file  . Highest education level: Not on file  Occupational History  . Not on file  Tobacco Use  . Smoking status: Current Every Day Smoker    Packs/day: 0.50    Types: Cigarettes  . Smokeless tobacco: Never Used  Vaping Use  . Vaping Use: Never used  Substance and Sexual Activity  . Alcohol use: Yes    Comment: rare  . Drug use: No  . Sexual activity: Not on file  Other Topics Concern  . Not on file  Social History Narrative  . Not on file   Social Determinants of Health   Financial Resource Strain: Not on file  Food Insecurity: Not on file  Transportation Needs: Not on file  Physical Activity: Not on file  Stress: Not on file  Social Connections: Not on file  Intimate Partner Violence: Not on file    Current Outpatient Medications:  .  amLODipine-benazepril (LOTREL) 5-20 MG capsule, TAKE 1 CAPSULE BY MOUTH EVERY DAY, Disp: 90 capsule, Rfl: 1 .  fluticasone (FLONASE) 50 MCG/ACT nasal spray, Place 1 spray into both nostrils 2 (two) times daily., Disp: 16 g, Rfl: 2 .  metoprolol succinate (TOPROL-XL) 100 MG 24 hr tablet, TAKE 1 TABLET (100 MG TOTAL) BY MOUTH DAILY. TAKE WITH OR IMMEDIATELY FOLLOWING A MEAL., Disp: 90 tablet, Rfl: 2 .  naloxone (NARCAN) nasal spray 4 mg/0.1 mL, 1 spray in each nostril x 1 if opioid overdose., Disp: 1 each, Rfl: 1 .  nicotine (NICODERM CQ - DOSED IN MG/24 HOURS) 14 mg/24hr patch, Place 1 patch (14 mg total) onto the skin daily., Disp: 28 patch, Rfl: 0 .  ondansetron (ZOFRAN ODT) 4 MG disintegrating tablet, 4mg  ODT q4 hours prn nausea/vomit, Disp: 20 tablet, Rfl: 0 .  ondansetron (ZOFRAN ODT) 8 MG disintegrating tablet, Take 1 tablet (8 mg total) by mouth every 8 (eight) hours as needed for nausea or vomiting., Disp: 20 tablet, Rfl: 1 .  oxyCODONE ER (XTAMPZA ER) 9 MG C12A, Take 1 tablet by mouth 2  (two) times daily., Disp: 60 capsule, Rfl: 0 .  Oxycodone HCl 10 MG TABS, 1 tablet twice daily as needed, can take an extra tablet if needed.  No more than 70 tablets/month., Disp: 70 tablet, Rfl: 0 .  promethazine (PHENERGAN) 25 MG suppository, Place 1 suppository (25 mg total) rectally every 6 (six) hours as needed for nausea or vomiting., Disp: 12 each, Rfl: 0 .  promethazine (PHENERGAN) 25 MG tablet, Take 1 tablet (25 mg total) by mouth daily as needed for nausea or vomiting., Disp: 30 tablet, Rfl: 2  EXAM:  VITALS per patient if applicable:Ht 6\' 1"  (1.854 m)   BMI 27.05 kg/m   GENERAL:  alert, oriented, appears well and in no acute distress  HEENT: atraumatic, conjunctiva clear, no obvious abnormalities on inspection.  NECK: normal movements of the head and neck  LUNGS: on inspection no signs of respiratory distress, breathing rate appears normal, no obvious gross SOB, gasping or wheezing  CV: no obvious cyanosis  MS: moves all visible extremities without noticeable abnormality  PSYCH/NEURO: pleasant and cooperative, no obvious depression or anxiety, speech and thought processing grossly intact  ASSESSMENT AND PLAN:  Discussed the following assessment and plan:  Chronic pain disorder - Plan: Ambulatory referral to Pain Clinic Pain does not seem to be well controlled. Explained that I do not feel comfortable increasing the dose of the medications. We reviewed her last urine drug screen. Referral to pain management placed.  Hypertension, essential, benign BP elevation today could be caused by severe pain. For now recommend continue with metoprolol succinate 100 mg daily and Amlodipine-benazepril 5-20 mg daily. Monitor BP at home. Continue low-salt diet.  Nausea and vomiting in adult Problem does not seem to be well controlled. Continue Phenergan25 mg daily as needed and ondansetron 8 mg every 8 hours as needed.  I discussed the assessment and treatment plan with the  patient. [name] was provided an opportunity to ask questions and all were answered. He agreed with the plan and demonstrated an understanding of the instructions.  No follow-ups on file.   Vikash Nest [place], MD

## 2020-08-01 NOTE — Telephone Encounter (Signed)
Last filled 07/03/20 

## 2020-08-02 ENCOUNTER — Other Ambulatory Visit: Payer: Self-pay | Admitting: Family Medicine

## 2020-08-02 DIAGNOSIS — G894 Chronic pain syndrome: Secondary | ICD-10-CM

## 2020-08-02 DIAGNOSIS — G8929 Other chronic pain: Secondary | ICD-10-CM

## 2020-08-02 DIAGNOSIS — R112 Nausea with vomiting, unspecified: Secondary | ICD-10-CM

## 2020-08-02 DIAGNOSIS — K861 Other chronic pancreatitis: Secondary | ICD-10-CM

## 2020-08-02 MED ORDER — OXYCODONE HCL 10 MG PO TABS: ORAL_TABLET | ORAL | 0 refills | Status: DC

## 2020-08-07 ENCOUNTER — Other Ambulatory Visit: Payer: Self-pay | Admitting: Family Medicine

## 2020-08-07 DIAGNOSIS — G894 Chronic pain syndrome: Secondary | ICD-10-CM

## 2020-08-10 MED ORDER — XTAMPZA ER 9 MG PO C12A
1.0000 | EXTENDED_RELEASE_CAPSULE | Freq: Two times a day (BID) | ORAL | 0 refills | Status: DC
Start: 2020-08-10 — End: 2020-09-05

## 2020-08-10 NOTE — Telephone Encounter (Signed)
Refilled his chronic pain medication once in absence of primary provider who is out on medical leave.

## 2020-08-10 NOTE — Telephone Encounter (Signed)
Can you advise in Dr. Doug Sou absence? It was last filled 07/10/20 and patient had an OV in 07/2020.

## 2020-08-29 ENCOUNTER — Other Ambulatory Visit: Payer: Self-pay | Admitting: Family Medicine

## 2020-08-29 DIAGNOSIS — G894 Chronic pain syndrome: Secondary | ICD-10-CM

## 2020-08-29 DIAGNOSIS — G8929 Other chronic pain: Secondary | ICD-10-CM

## 2020-08-29 NOTE — Telephone Encounter (Signed)
Last filled 08/02/20

## 2020-08-30 MED ORDER — OXYCODONE HCL 10 MG PO TABS: ORAL_TABLET | ORAL | 0 refills | Status: DC

## 2020-09-05 ENCOUNTER — Other Ambulatory Visit: Payer: Self-pay | Admitting: Family Medicine

## 2020-09-05 DIAGNOSIS — G894 Chronic pain syndrome: Secondary | ICD-10-CM

## 2020-09-05 NOTE — Telephone Encounter (Signed)
Last filled 08/10/20

## 2020-09-06 MED ORDER — XTAMPZA ER 9 MG PO C12A: 1.0000 | EXTENDED_RELEASE_CAPSULE | Freq: Two times a day (BID) | ORAL | 0 refills | Status: DC

## 2020-09-16 ENCOUNTER — Other Ambulatory Visit: Payer: Self-pay

## 2020-09-16 ENCOUNTER — Encounter (HOSPITAL_BASED_OUTPATIENT_CLINIC_OR_DEPARTMENT_OTHER): Payer: Self-pay | Admitting: *Deleted

## 2020-09-16 ENCOUNTER — Emergency Department (HOSPITAL_BASED_OUTPATIENT_CLINIC_OR_DEPARTMENT_OTHER)
Admission: EM | Admit: 2020-09-16 | Discharge: 2020-09-16 | Disposition: A | Discharge status: Home / Self Care | Payer: BLUE CROSS/BLUE SHIELD | Attending: Emergency Medicine | Admitting: Emergency Medicine

## 2020-09-16 DIAGNOSIS — F17210 Nicotine dependence, cigarettes, uncomplicated: Secondary | ICD-10-CM | POA: Diagnosis not present

## 2020-09-16 DIAGNOSIS — Z955 Presence of coronary angioplasty implant and graft: Secondary | ICD-10-CM | POA: Diagnosis not present

## 2020-09-16 DIAGNOSIS — R112 Nausea with vomiting, unspecified: Secondary | ICD-10-CM | POA: Diagnosis not present

## 2020-09-16 DIAGNOSIS — R1012 Left upper quadrant pain: Secondary | ICD-10-CM | POA: Diagnosis not present

## 2020-09-16 DIAGNOSIS — I10 Essential (primary) hypertension: Secondary | ICD-10-CM | POA: Diagnosis not present

## 2020-09-16 DIAGNOSIS — Z79899 Other long term (current) drug therapy: Secondary | ICD-10-CM | POA: Diagnosis not present

## 2020-09-16 DIAGNOSIS — R1013 Epigastric pain: Secondary | ICD-10-CM

## 2020-09-16 LAB — CBC WITH DIFFERENTIAL/PLATELET
Abs Immature Granulocytes: 0.06 10*3/uL (ref 0.00–0.07)
Basophils Absolute: 0.1 10*3/uL (ref 0.0–0.1)
Basophils Relative: 1 %
Eosinophils Absolute: 0.3 10*3/uL (ref 0.0–0.5)
Eosinophils Relative: 3 %
HCT: 42.5 % (ref 39.0–52.0)
Hemoglobin: 14.9 g/dL (ref 13.0–17.0)
Immature Granulocytes: 1 %
Lymphocytes Relative: 31 %
Lymphs Abs: 3.3 10*3/uL (ref 0.7–4.0)
MCH: 30 pg (ref 26.0–34.0)
MCHC: 35.1 g/dL (ref 30.0–36.0)
MCV: 85.5 fL (ref 80.0–100.0)
Monocytes Absolute: 1 10*3/uL (ref 0.1–1.0)
Monocytes Relative: 10 %
Neutro Abs: 6 10*3/uL (ref 1.7–7.7)
Neutrophils Relative %: 54 %
Platelets: 272 10*3/uL (ref 150–400)
RBC: 4.97 MIL/uL (ref 4.22–5.81)
RDW: 12.9 % (ref 11.5–15.5)
WBC: 10.8 10*3/uL — ABNORMAL HIGH (ref 4.0–10.5)
nRBC: 0 % (ref 0.0–0.2)

## 2020-09-16 LAB — COMPREHENSIVE METABOLIC PANEL
ALT: 27 U/L (ref 0–44)
AST: 22 U/L (ref 15–41)
Albumin: 3.9 g/dL (ref 3.5–5.0)
Alkaline Phosphatase: 68 U/L (ref 38–126)
Anion gap: 8 (ref 5–15)
BUN: 16 mg/dL (ref 6–20)
CO2: 24 mmol/L (ref 22–32)
Calcium: 9.3 mg/dL (ref 8.9–10.3)
Chloride: 106 mmol/L (ref 98–111)
Creatinine, Ser: 0.85 mg/dL (ref 0.61–1.24)
GFR, Estimated: >60 mL/min (ref >60)
Glucose, Bld: 110 mg/dL — ABNORMAL HIGH (ref 70–99)
Potassium: 3.4 mmol/L — ABNORMAL LOW (ref 3.5–5.1)
Sodium: 138 mmol/L (ref 135–145)
Total Bilirubin: 0.1 mg/dL — ABNORMAL LOW (ref 0.3–1.2)
Total Protein: 7 g/dL (ref 6.5–8.1)

## 2020-09-16 LAB — LIPASE, BLOOD: Lipase: 32 U/L (ref 11–51)

## 2020-09-16 MED ORDER — SODIUM CHLORIDE 0.9 % IV SOLN
12.5000 mg | Freq: Four times a day (QID) | INTRAVENOUS | Status: DC | PRN
  Administered 2020-09-16: 12.5 mg via INTRAVENOUS
  Filled 2020-09-16: qty 0.5

## 2020-09-16 MED ORDER — PROMETHAZINE HCL 25 MG/ML IJ SOLN
INTRAMUSCULAR | Status: AC
  Filled 2020-09-16: qty 1

## 2020-09-16 MED ORDER — LACTATED RINGERS IV BOLUS
1000.0000 mL | Freq: Once | INTRAVENOUS | Status: AC
  Administered 2020-09-16: 1000 mL via INTRAVENOUS

## 2020-09-16 MED ORDER — HYDROMORPHONE HCL 1 MG/ML IJ SOLN
1.0000 mg | Freq: Once | INTRAMUSCULAR | Status: AC
Start: 2020-09-16 — End: 2020-09-16
  Administered 2020-09-16: 1 mg via INTRAVENOUS
  Filled 2020-09-16: qty 1

## 2020-09-16 MED ORDER — LACTATED RINGERS IV BOLUS: 1000.0000 mL | Freq: Once | INTRAVENOUS | Status: DC

## 2020-09-16 NOTE — ED Provider Notes (Signed)
[place] EMERGENCY DEPARTMENT Provider Note   CSN: [id number] Arrival date & time: 09/16/20  0034     History Chief Complaint  Patient presents with  . Abdominal Pain    [name] is a 45 y.o. male.   Abdominal Pain Pain location:  Epigastric and LUQ Pain quality: aching and sharp   Pain radiates to:  Does not radiate Pain severity:  Severe Onset quality:  Gradual Duration:  6 hours Timing:  Constant Progression:  Worsening Chronicity:  Recurrent Context: not alcohol use and not eating   Relieved by:  None tried Worsened by:  Nothing Ineffective treatments:  None tried      Past Medical History:  Diagnosis Date  . Chronic abdominal pain   . Diverticulitis   . Drug-seeking behavior   . Hypertension   . Kidney stones   . Pancreatitis, chronic ([place]) 05/28/2005    Patient Active Problem List   Diagnosis Date Noted  . Hyperlipidemia, mixed 03/01/2020  . Abnormal magnetic resonance cholangiopancreatography (MRCP)   . Choledocholithiasis   . E coli bacteremia   . Bacteremia due to Enterobacter species   . Bacteremia 12/27/2019  . QT prolongation 12/27/2019  . Nausea without vomiting   . Chronic pain disorder 08/29/2018  . Back pain, chronic 08/29/2018  . Nausea and vomiting in adult 02/25/2018  . Hypertension, essential, benign 01/18/2018  . Chronic biliary pancreatitis ([place]) 01/18/2018  . Chronic abdominal pain 01/14/2018    Past Surgical History:  Procedure Laterality Date  . APPENDECTOMY    . CHOLECYSTECTOMY    . ERCP N/A 12/30/2019   Procedure: ENDOSCOPIC RETROGRADE CHOLANGIOPANCREATOGRAPHY (ERCP);  Surgeon: Milus Banister, MD;  Location: [facility];  Service: Endoscopy;  Laterality: N/A;  . ERCP W/ METAL STENT PLACEMENT    . KIDNEY STONE SURGERY    . REMOVAL OF STONES  12/30/2019   Procedure: REMOVAL OF STONES;  Surgeon: Milus Banister, MD;  Location: [facility];  Service: Endoscopy;;       Family History  Problem  Relation Age of Onset  . Cancer Mother   . Early death Mother   . Hypertension Mother   . Heart failure Father   . Hypertension Father   . Heart disease Father   . Hyperlipidemia Father   . Pancreatic cancer Paternal Grandmother        possibly  . Pancreatic cancer Paternal Aunt   . Colon cancer Neg Hx     Social History   Tobacco Use  . Smoking status: Current Every Day Smoker    Packs/day: 0.50    Types: Cigarettes  . Smokeless tobacco: Never Used  Vaping Use  . Vaping Use: Never used  Substance Use Topics  . Alcohol use: Yes    Comment: rare  . Drug use: No    Home Medications Prior to Admission medications   Medication Sig Start Date End Date Taking? Authorizing Provider  amLODipine-benazepril (LOTREL) 5-20 MG capsule TAKE 1 CAPSULE BY MOUTH EVERY DAY 05/23/20   [place], Betty G, MD  fluticasone [facility]) 50 MCG/ACT nasal spray Place 1 spray into both nostrils 2 (two) times daily. 04/03/19   [place], Betty G, MD  metoprolol succinate (TOPROL-XL) 100 MG 24 hr tablet TAKE 1 TABLET (100 MG TOTAL) BY MOUTH DAILY. TAKE WITH OR IMMEDIATELY FOLLOWING A MEAL. 01/22/20   [place], Betty G, MD  naloxone [facility]) nasal spray 4 mg/0.1 mL 1 spray in each nostril x 1 if opioid overdose. 06/08/20   [place],  Malka So, MD  nicotine (NICODERM CQ - DOSED IN MG/24 HOURS) 14 mg/24hr patch Place 1 patch (14 mg total) onto the skin daily. 12/31/19   Eugenie Filler, MD  ondansetron (ZOFRAN ODT) 4 MG disintegrating tablet 4mg  ODT q4 hours prn nausea/vomit 06/29/20   Deno Etienne, DO  ondansetron (ZOFRAN ODT) 8 MG disintegrating tablet Take 1 tablet (8 mg total) by mouth every 8 (eight) hours as needed for nausea or vomiting. 06/24/19   [place], Betty G, MD  oxyCODONE ER [facility]) 9 MG C12A Take 1 tablet by mouth 2 (two) times daily. 09/06/20   [place], Betty G, MD  Oxycodone HCl 10 MG TABS 1 tablet twice daily as needed, can take an extra tablet if needed.  No more than 70 tablets/month. 08/30/20   [place],  Betty G, MD  promethazine (PHENERGAN) 25 MG suppository Place 1 suppository (25 mg total) rectally every 6 (six) hours as needed for nausea or vomiting. 06/29/20   Deno Etienne, DO  promethazine (PHENERGAN) 25 MG tablet TAKE 1 TABLET BY MOUTH EVERY 12 HOURS AS NEEDED 08/02/20   [place], Betty G, MD    Allergies    Cortisone, Eggs or egg-derived products, Ketorolac, Ketorolac tromethamine, Haldol [haloperidol lactate], Haloperidol, Hydrocortisone, Iodinated diagnostic agents, Ketorolac tromethamine, and Reglan [metoclopramide]  Review of Systems   Review of Systems  Gastrointestinal: Positive for abdominal pain.  All other systems reviewed and are negative.   Physical Exam Updated Vital Signs BP (!) 140/98 (BP Location: Right Arm)   Pulse 70   Temp 98.1 F (36.7 C) (Oral)   Resp 16   Ht 6\' 1"  (1.854 m)   Wt 93 kg   SpO2 96%   BMI 27.05 kg/m   Physical Exam Vitals and nursing note reviewed.  Constitutional:      Appearance: He is well-developed.  HENT:     Head: Normocephalic and atraumatic.     Nose: No congestion or rhinorrhea.     Mouth/Throat:     Mouth: Mucous membranes are moist.     Pharynx: Oropharynx is clear.  Eyes:     Pupils: Pupils are equal, round, and reactive to light.  Cardiovascular:     Rate and Rhythm: Normal rate.  Pulmonary:     Effort: Pulmonary effort is normal. No respiratory distress.  Abdominal:     General: Abdomen is flat. There is no distension.     Tenderness: There is no abdominal tenderness.  Musculoskeletal:        General: Normal range of motion.     Cervical back: Normal range of motion.  Skin:    General: Skin is warm and dry.  Neurological:     General: No focal deficit present.     Mental Status: He is alert.     ED Results / Procedures / Treatments   Labs (all labs ordered are listed, but only abnormal results are displayed) Labs Reviewed  CBC WITH DIFFERENTIAL/PLATELET - Abnormal; Notable for the following components:       Result Value   WBC 10.8 (*)    All other components within normal limits  COMPREHENSIVE METABOLIC PANEL - Abnormal; Notable for the following components:   Potassium 3.4 (*)    Glucose, Bld 110 (*)    Total Bilirubin 0.1 (*)    All other components within normal limits  LIPASE, BLOOD  URINALYSIS, ROUTINE W REFLEX MICROSCOPIC    EKG None  Radiology No results found.  Procedures Procedures   Medications Ordered  in ED Medications  lactated ringers bolus 1,000 mL (has no administration in time range)  promethazine (PHENERGAN) 12.5 mg in sodium chloride 0.9 % 50 mL IVPB (0 mg Intravenous Stopped 09/16/20 0320)  promethazine (PHENERGAN) 25 MG/ML injection (has no administration in time range)  HYDROmorphone (DILAUDID) injection 1 mg (1 mg Intravenous Given 09/16/20 0212)  lactated ringers bolus 1,000 mL (0 mLs Intravenous Stopped 09/16/20 0315)  HYDROmorphone (DILAUDID) injection 1 mg (1 mg Intravenous Given 09/16/20 0254)    ED Course  I have reviewed the triage vital signs and the nursing notes.  Pertinent labs & imaging results that were available during my care of the patient were reviewed by me and considered in my medical decision making (see chart for details).    MDM Rules/Calculators/A&P                          Abdominal pain. No e/o pancreatitis. Rest of labs at baseline. Pain and emesis improved. Stable for discharge.   Final Clinical Impression(s) / ED Diagnoses Final diagnoses:  Non-intractable vomiting with nausea, unspecified vomiting type  Epigastric pain    Rx / DC Orders ED Discharge Orders    None       Kyston Gonce, Corene Cornea, MD 09/16/20 [phone]

## 2020-09-16 NOTE — ED Triage Notes (Addendum)
C/o abd pain x 2 hrs . Hx chronic abd pain with referral to pain clinic last month by PMD

## 2020-09-19 ENCOUNTER — Encounter: Payer: Self-pay | Admitting: Physical Medicine and Rehabilitation

## 2020-09-21 ENCOUNTER — Other Ambulatory Visit: Payer: Self-pay | Admitting: Family Medicine

## 2020-09-21 DIAGNOSIS — K861 Other chronic pancreatitis: Secondary | ICD-10-CM

## 2020-09-21 DIAGNOSIS — R112 Nausea with vomiting, unspecified: Secondary | ICD-10-CM

## 2020-09-26 ENCOUNTER — Emergency Department (HOSPITAL_BASED_OUTPATIENT_CLINIC_OR_DEPARTMENT_OTHER)
Admission: EM | Admit: 2020-09-26 | Discharge: 2020-09-26 | Disposition: A | Discharge status: Home / Self Care | Payer: BLUE CROSS/BLUE SHIELD | Attending: Emergency Medicine | Admitting: Emergency Medicine

## 2020-09-26 ENCOUNTER — Other Ambulatory Visit: Payer: Self-pay | Admitting: Family Medicine

## 2020-09-26 ENCOUNTER — Other Ambulatory Visit: Payer: Self-pay

## 2020-09-26 ENCOUNTER — Encounter (HOSPITAL_BASED_OUTPATIENT_CLINIC_OR_DEPARTMENT_OTHER): Payer: Self-pay | Admitting: Emergency Medicine

## 2020-09-26 DIAGNOSIS — F17210 Nicotine dependence, cigarettes, uncomplicated: Secondary | ICD-10-CM | POA: Insufficient documentation

## 2020-09-26 DIAGNOSIS — Z79899 Other long term (current) drug therapy: Secondary | ICD-10-CM | POA: Insufficient documentation

## 2020-09-26 DIAGNOSIS — R1012 Left upper quadrant pain: Secondary | ICD-10-CM | POA: Diagnosis not present

## 2020-09-26 DIAGNOSIS — R112 Nausea with vomiting, unspecified: Secondary | ICD-10-CM | POA: Diagnosis not present

## 2020-09-26 DIAGNOSIS — K861 Other chronic pancreatitis: Secondary | ICD-10-CM | POA: Insufficient documentation

## 2020-09-26 DIAGNOSIS — I10 Essential (primary) hypertension: Secondary | ICD-10-CM | POA: Diagnosis not present

## 2020-09-26 DIAGNOSIS — G8929 Other chronic pain: Secondary | ICD-10-CM

## 2020-09-26 DIAGNOSIS — D72829 Elevated white blood cell count, unspecified: Secondary | ICD-10-CM | POA: Insufficient documentation

## 2020-09-26 DIAGNOSIS — G894 Chronic pain syndrome: Secondary | ICD-10-CM

## 2020-09-26 LAB — COMPREHENSIVE METABOLIC PANEL
ALT: 23 U/L (ref 0–44)
AST: 21 U/L (ref 15–41)
Albumin: 4.1 g/dL (ref 3.5–5.0)
Alkaline Phosphatase: 64 U/L (ref 38–126)
Anion gap: 11 (ref 5–15)
BUN: 17 mg/dL (ref 6–20)
CO2: 20 mmol/L — ABNORMAL LOW (ref 22–32)
Calcium: 9.4 mg/dL (ref 8.9–10.3)
Chloride: 106 mmol/L (ref 98–111)
Creatinine, Ser: 0.88 mg/dL (ref 0.61–1.24)
GFR, Estimated: >60 mL/min (ref >60)
Glucose, Bld: 106 mg/dL — ABNORMAL HIGH (ref 70–99)
Potassium: 3.3 mmol/L — ABNORMAL LOW (ref 3.5–5.1)
Sodium: 137 mmol/L (ref 135–145)
Total Bilirubin: 0.8 mg/dL (ref 0.3–1.2)
Total Protein: 7.2 g/dL (ref 6.5–8.1)

## 2020-09-26 LAB — CBC
HCT: 45.5 % (ref 39.0–52.0)
Hemoglobin: 16.1 g/dL (ref 13.0–17.0)
MCH: 30.2 pg (ref 26.0–34.0)
MCHC: 35.4 g/dL (ref 30.0–36.0)
MCV: 85.4 fL (ref 80.0–100.0)
Platelets: 245 10*3/uL (ref 150–400)
RBC: 5.33 MIL/uL (ref 4.22–5.81)
RDW: 12.6 % (ref 11.5–15.5)
WBC: 15.3 10*3/uL — ABNORMAL HIGH (ref 4.0–10.5)
nRBC: 0 % (ref 0.0–0.2)

## 2020-09-26 LAB — LIPASE, BLOOD: Lipase: 32 U/L (ref 11–51)

## 2020-09-26 MED ORDER — LACTATED RINGERS IV BOLUS
1000.0000 mL | Freq: Once | INTRAVENOUS | Status: AC
  Administered 2020-09-26: 1000 mL via INTRAVENOUS

## 2020-09-26 MED ORDER — ONDANSETRON HCL 4 MG/2ML IJ SOLN
4.0000 mg | Freq: Once | INTRAMUSCULAR | Status: AC | PRN
  Administered 2020-09-26: 4 mg via INTRAVENOUS
  Filled 2020-09-26: qty 2

## 2020-09-26 MED ORDER — MORPHINE SULFATE (PF) 4 MG/ML IV SOLN
4.0000 mg | Freq: Once | INTRAVENOUS | Status: AC
  Administered 2020-09-26: 4 mg via INTRAVENOUS
  Filled 2020-09-26: qty 1

## 2020-09-26 NOTE — ED Triage Notes (Signed)
Reports chronic pancreatitis.  Having LUQ abdominal pain since about noon yesterday with n/v.  Vomited X 5.

## 2020-09-26 NOTE — ED Notes (Signed)
EDP Ralene Bathe made aware of continued pain.

## 2020-09-26 NOTE — ED Provider Notes (Signed)
[place] EMERGENCY DEPARTMENT Provider Note   CSN: [id number] Arrival date & time: 09/26/20  0158     History Chief Complaint  Patient presents with  . Abdominal Pain    [name] is a 45 y.o. male.  The history is provided by the patient and medical records.  Abdominal Pain  [name] is a 45 y.o. male who presents to the Emergency Department complaining of pancreatitis flare.  He presents to the ED for evaluation of LUQ abdominal pain that started around lunchtime today. Pain is described as sharp and stabbing, radiates to back, feels like prior pancreatitis flares.  Has associated nausea, vomiting (5+ episodes, nonbloody).    No diarrhea, fever, dysuria.    Had an episode of similar pain one week ago and two months.    Sees Dr. Henrene Pastor with GI.    Smokes tobacco, rare alcohol, no street drugs.    Takes oxycodone and xtampza for pain.      Past Medical History:  Diagnosis Date  . Chronic abdominal pain   . Diverticulitis   . Drug-seeking behavior   . Hypertension   . Kidney stones   . Pancreatitis, chronic ([place]) 05/28/2005    Patient Active Problem List   Diagnosis Date Noted  . Hyperlipidemia, mixed 03/01/2020  . Abnormal magnetic resonance cholangiopancreatography (MRCP)   . Choledocholithiasis   . E coli bacteremia   . Bacteremia due to Enterobacter species   . Bacteremia 12/27/2019  . QT prolongation 12/27/2019  . Nausea without vomiting   . Chronic pain disorder 08/29/2018  . Back pain, chronic 08/29/2018  . Nausea and vomiting in adult 02/25/2018  . Hypertension, essential, benign 01/18/2018  . Chronic biliary pancreatitis ([place]) 01/18/2018  . Chronic abdominal pain 01/14/2018    Past Surgical History:  Procedure Laterality Date  . APPENDECTOMY    . CHOLECYSTECTOMY    . ERCP N/A 12/30/2019   Procedure: ENDOSCOPIC RETROGRADE CHOLANGIOPANCREATOGRAPHY (ERCP);  Surgeon: Milus Banister, MD;  Location: [facility];  Service:  Endoscopy;  Laterality: N/A;  . ERCP W/ METAL STENT PLACEMENT    . KIDNEY STONE SURGERY    . REMOVAL OF STONES  12/30/2019   Procedure: REMOVAL OF STONES;  Surgeon: Milus Banister, MD;  Location: [facility];  Service: Endoscopy;;       Family History  Problem Relation Age of Onset  . Cancer Mother   . Early death Mother   . Hypertension Mother   . Heart failure Father   . Hypertension Father   . Heart disease Father   . Hyperlipidemia Father   . Pancreatic cancer Paternal Grandmother        possibly  . Pancreatic cancer Paternal Aunt   . Colon cancer Neg Hx     Social History   Tobacco Use  . Smoking status: Current Every Day Smoker    Packs/day: 0.50    Types: Cigarettes  . Smokeless tobacco: Never Used  Vaping Use  . Vaping Use: Never used  Substance Use Topics  . Alcohol use: Yes    Comment: rare  . Drug use: No    Home Medications Prior to Admission medications   Medication Sig Start Date End Date Taking? Authorizing Provider  amLODipine-benazepril (LOTREL) 5-20 MG capsule TAKE 1 CAPSULE BY MOUTH EVERY DAY 05/23/20   [place], Betty G, MD  fluticasone [facility]) 50 MCG/ACT nasal spray Place 1 spray into both nostrils 2 (two) times daily. 04/03/19   [place], Betty G, MD  metoprolol succinate (TOPROL-XL) 100 MG 24 hr tablet TAKE 1 TABLET (100 MG TOTAL) BY MOUTH DAILY. TAKE WITH OR IMMEDIATELY FOLLOWING A MEAL. 01/22/20   [place], Betty G, MD  naloxone [facility]) nasal spray 4 mg/0.1 mL 1 spray in each nostril x 1 if opioid overdose. 06/08/20   [place], Betty G, MD  nicotine (NICODERM CQ - DOSED IN MG/24 HOURS) 14 mg/24hr patch Place 1 patch (14 mg total) onto the skin daily. 12/31/19   Eugenie Filler, MD  ondansetron (ZOFRAN ODT) 4 MG disintegrating tablet 4mg  ODT q4 hours prn nausea/vomit 06/29/20   Deno Etienne, DO  ondansetron (ZOFRAN ODT) 8 MG disintegrating tablet Take 1 tablet (8 mg total) by mouth every 8 (eight) hours as needed for nausea or vomiting. 06/24/19   [place],  Betty G, MD  oxyCODONE ER [facility]) 9 MG C12A Take 1 tablet by mouth 2 (two) times daily. 09/06/20   [place], Betty G, MD  Oxycodone HCl 10 MG TABS 1 tablet twice daily as needed, can take an extra tablet if needed.  No more than 70 tablets/month. 08/30/20   [place], Betty G, MD  promethazine (PHENERGAN) 25 MG suppository Place 1 suppository (25 mg total) rectally every 6 (six) hours as needed for nausea or vomiting. 06/29/20   Deno Etienne, DO  promethazine (PHENERGAN) 25 MG tablet TAKE 1 TABLET BY MOUTH EVERY 12 HOURS AS NEEDED 09/23/20   [place], Betty G, MD    Allergies    Cortisone, Eggs or egg-derived products, Ketorolac, Ketorolac tromethamine, Haldol [haloperidol lactate], Haloperidol, Hydrocortisone, Iodinated diagnostic agents, Ketorolac tromethamine, and Reglan [metoclopramide]  Review of Systems   Review of Systems  Gastrointestinal: Positive for abdominal pain.  All other systems reviewed and are negative.   Physical Exam Updated Vital Signs BP (!) 152/101   Pulse 83   Temp 98.2 F (36.8 C) (Oral)   Resp 18   Ht 6\' 1"  (1.854 m)   Wt 93 kg   SpO2 95%   BMI 27.05 kg/m   Physical Exam Vitals and nursing note reviewed.  Constitutional:      Appearance: He is well-developed.  HENT:     Head: Normocephalic and atraumatic.  Cardiovascular:     Rate and Rhythm: Normal rate and regular rhythm.     Heart sounds: No murmur heard.   Pulmonary:     Effort: Pulmonary effort is normal. No respiratory distress.     Breath sounds: Normal breath sounds.  Abdominal:     Palpations: Abdomen is soft.     Tenderness: There is no guarding or rebound.     Comments: Moderate LUQ tenderness  Musculoskeletal:        General: No swelling or tenderness.  Skin:    General: Skin is warm and dry.  Neurological:     Mental Status: He is alert and oriented to person, place, and time.  Psychiatric:        Behavior: Behavior normal.     ED Results / Procedures / Treatments   Labs (all  labs ordered are listed, but only abnormal results are displayed) Labs Reviewed  COMPREHENSIVE METABOLIC PANEL - Abnormal; Notable for the following components:      Result Value   Potassium 3.3 (*)    CO2 20 (*)    Glucose, Bld 106 (*)    All other components within normal limits  CBC - Abnormal; Notable for the following components:   WBC 15.3 (*)    All other components within normal  limits  LIPASE, BLOOD  URINALYSIS, ROUTINE W REFLEX MICROSCOPIC    EKG None  Radiology No results found.  Procedures Procedures   Medications Ordered in ED Medications  ondansetron (ZOFRAN) injection 4 mg (4 mg Intravenous Given 09/26/20 0331)  lactated ringers bolus 1,000 mL (0 mLs Intravenous Stopped 09/26/20 0431)  morphine 4 MG/ML injection 4 mg (4 mg Intravenous Given 09/26/20 0331)  morphine 4 MG/ML injection 4 mg (4 mg Intravenous Given 09/26/20 0505)    ED Course  I have reviewed the triage vital signs and the nursing notes.  Pertinent labs & imaging results that were available during my care of the patient were reviewed by me and considered in my medical decision making (see chart for details).    MDM Rules/Calculators/A&P                         patient with history of pancreatitis here for evaluation of left upper quadrant pain similar to prior pancreatitis flares. He does have tenderness on examination without peritoneal findings. CBC with leukocytosis, he has chronic leukocytosis on review of prior labs. CMP with mild hypokalemia, lipase is within normal limits. He was treated with pain medications, IV fluids and antiemetics in the emergency department. No recurrent vomiting in the emergency department. He has prescriptions for pain as well as nausea medicines at home. Discussed with patient home care for chronic pancreatitis with G.I. follow-up.  Final Clinical Impression(s) / ED Diagnoses Final diagnoses:  Left upper quadrant abdominal pain    Rx / DC Orders ED Discharge Orders     None       Quintella Reichert, MD 09/26/20 [phone]

## 2020-09-28 MED ORDER — OXYCODONE HCL 10 MG PO TABS: ORAL_TABLET | ORAL | 0 refills | Status: DC

## 2020-10-05 ENCOUNTER — Other Ambulatory Visit: Payer: Self-pay | Admitting: Family Medicine

## 2020-10-05 DIAGNOSIS — G894 Chronic pain syndrome: Secondary | ICD-10-CM

## 2020-10-05 MED ORDER — XTAMPZA ER 9 MG PO C12A: 1.0000 | EXTENDED_RELEASE_CAPSULE | Freq: Two times a day (BID) | ORAL | 0 refills | Status: DC

## 2020-10-05 NOTE — Telephone Encounter (Signed)
Last filled 09/09/20, has f/u with pcp on 10/14/20, can you refill in pcp's absence?

## 2020-10-05 NOTE — Telephone Encounter (Signed)
Filled once in absence of primary provider

## 2020-10-14 ENCOUNTER — Ambulatory Visit: Payer: BLUE CROSS/BLUE SHIELD | Admitting: Family Medicine

## 2020-10-16 DIAGNOSIS — Z886 Allergy status to analgesic agent status: Secondary | ICD-10-CM | POA: Diagnosis not present

## 2020-10-16 DIAGNOSIS — R1011 Right upper quadrant pain: Secondary | ICD-10-CM | POA: Diagnosis not present

## 2020-10-16 DIAGNOSIS — Z87442 Personal history of urinary calculi: Secondary | ICD-10-CM | POA: Diagnosis not present

## 2020-10-16 DIAGNOSIS — I10 Essential (primary) hypertension: Secondary | ICD-10-CM | POA: Diagnosis not present

## 2020-10-16 DIAGNOSIS — Z888 Allergy status to other drugs, medicaments and biological substances status: Secondary | ICD-10-CM | POA: Diagnosis not present

## 2020-10-16 DIAGNOSIS — Z91012 Allergy to eggs: Secondary | ICD-10-CM | POA: Diagnosis not present

## 2020-10-16 DIAGNOSIS — R0600 Dyspnea, unspecified: Secondary | ICD-10-CM | POA: Diagnosis not present

## 2020-10-16 DIAGNOSIS — R079 Chest pain, unspecified: Secondary | ICD-10-CM | POA: Diagnosis not present

## 2020-10-16 DIAGNOSIS — R1013 Epigastric pain: Secondary | ICD-10-CM | POA: Diagnosis not present

## 2020-10-16 DIAGNOSIS — R112 Nausea with vomiting, unspecified: Secondary | ICD-10-CM | POA: Diagnosis not present

## 2020-10-16 DIAGNOSIS — R1084 Generalized abdominal pain: Secondary | ICD-10-CM | POA: Diagnosis not present

## 2020-10-18 ENCOUNTER — Emergency Department (HOSPITAL_BASED_OUTPATIENT_CLINIC_OR_DEPARTMENT_OTHER)
Admission: EM | Admit: 2020-10-18 | Discharge: 2020-10-19 | Disposition: A | Discharge status: Home / Self Care | Payer: BLUE CROSS/BLUE SHIELD | Attending: Emergency Medicine | Admitting: Emergency Medicine

## 2020-10-18 ENCOUNTER — Other Ambulatory Visit: Payer: Self-pay

## 2020-10-18 ENCOUNTER — Encounter (HOSPITAL_BASED_OUTPATIENT_CLINIC_OR_DEPARTMENT_OTHER): Payer: Self-pay

## 2020-10-18 DIAGNOSIS — R112 Nausea with vomiting, unspecified: Secondary | ICD-10-CM | POA: Insufficient documentation

## 2020-10-18 DIAGNOSIS — K861 Other chronic pancreatitis: Secondary | ICD-10-CM | POA: Diagnosis not present

## 2020-10-18 DIAGNOSIS — I10 Essential (primary) hypertension: Secondary | ICD-10-CM | POA: Insufficient documentation

## 2020-10-18 DIAGNOSIS — R1013 Epigastric pain: Secondary | ICD-10-CM | POA: Diagnosis not present

## 2020-10-18 DIAGNOSIS — F17210 Nicotine dependence, cigarettes, uncomplicated: Secondary | ICD-10-CM | POA: Diagnosis not present

## 2020-10-18 DIAGNOSIS — Z79899 Other long term (current) drug therapy: Secondary | ICD-10-CM | POA: Insufficient documentation

## 2020-10-18 LAB — CBC
HCT: 43 % (ref 39.0–52.0)
Hemoglobin: 15 g/dL (ref 13.0–17.0)
MCH: 29.8 pg (ref 26.0–34.0)
MCHC: 34.9 g/dL (ref 30.0–36.0)
MCV: 85.3 fL (ref 80.0–100.0)
Platelets: 259 10*3/uL (ref 150–400)
RBC: 5.04 MIL/uL (ref 4.22–5.81)
RDW: 12.6 % (ref 11.5–15.5)
WBC: 12.2 10*3/uL — ABNORMAL HIGH (ref 4.0–10.5)
nRBC: 0 % (ref 0.0–0.2)

## 2020-10-18 LAB — COMPREHENSIVE METABOLIC PANEL
ALT: 23 U/L (ref 0–44)
AST: 18 U/L (ref 15–41)
Albumin: 4.4 g/dL (ref 3.5–5.0)
Alkaline Phosphatase: 66 U/L (ref 38–126)
Anion gap: 9 (ref 5–15)
BUN: 14 mg/dL (ref 6–20)
CO2: 24 mmol/L (ref 22–32)
Calcium: 9.5 mg/dL (ref 8.9–10.3)
Chloride: 106 mmol/L (ref 98–111)
Creatinine, Ser: 0.74 mg/dL (ref 0.61–1.24)
GFR, Estimated: >60 mL/min (ref >60)
Glucose, Bld: 124 mg/dL — ABNORMAL HIGH (ref 70–99)
Potassium: 3.7 mmol/L (ref 3.5–5.1)
Sodium: 139 mmol/L (ref 135–145)
Total Bilirubin: 0.4 mg/dL (ref 0.3–1.2)
Total Protein: 7.1 g/dL (ref 6.5–8.1)

## 2020-10-18 LAB — LIPASE, BLOOD: Lipase: 12 U/L (ref 11–51)

## 2020-10-18 MED ORDER — ONDANSETRON HCL 4 MG/2ML IJ SOLN
4.0000 mg | Freq: Once | INTRAMUSCULAR | Status: AC
  Administered 2020-10-19: 4 mg via INTRAVENOUS
  Filled 2020-10-18: qty 2

## 2020-10-18 MED ORDER — HYDROMORPHONE HCL 1 MG/ML IJ SOLN
1.0000 mg | Freq: Once | INTRAMUSCULAR | Status: AC
  Administered 2020-10-19: 1 mg via INTRAVENOUS
  Filled 2020-10-18: qty 1

## 2020-10-18 MED ORDER — SODIUM CHLORIDE 0.9 % IV BOLUS
1000.0000 mL | Freq: Once | INTRAVENOUS | Status: AC
  Administered 2020-10-19: 1000 mL via INTRAVENOUS

## 2020-10-18 NOTE — ED Triage Notes (Signed)
Pt c/o abdominal pain with nausea and vomiting. Pt has hx of pancreatitis and states this feels the same. Pt tried taking oxycodone and zofran as prescribed with no relief.

## 2020-10-18 NOTE — ED Provider Notes (Signed)
[place] EMERGENCY DEPT Provider Note   CSN: [id number] Arrival date & time: 10/18/20  2200     History Chief Complaint  Patient presents with  . Abdominal Pain    [name] is a 45 y.o. male.  Patient is a 45 year old male with past medical history of chronic, recurrent pancreatitis followed originally at [facility], now followed by Dr. Henrene Pastor.  He presents today for evaluation of abdominal pain.  He describes epigastric pain that has worsened since earlier today.  He feels nauseated and has vomited.  He denies any fevers or chills.  Denies any bloody stool or vomit.  This feels similar to what he experienced with his prior pancreatitis flares.  He has tried Percocet at home, however has had little relief.  He denies alcohol use.  The history is provided by the patient.  Abdominal Pain Pain location:  Epigastric Pain quality: cramping   Pain radiates to:  Does not radiate Pain severity:  Moderate Timing:  Constant Progression:  Worsening Chronicity:  New Relieved by:  Nothing Worsened by:  Palpation and movement Associated symptoms: nausea and vomiting   Associated symptoms: no constipation and no diarrhea        Past Medical History:  Diagnosis Date  . Chronic abdominal pain   . Diverticulitis   . Drug-seeking behavior   . Hypertension   . Kidney stones   . Pancreatitis, chronic ([place]) 05/28/2005    Patient Active Problem List   Diagnosis Date Noted  . Hyperlipidemia, mixed 03/01/2020  . Abnormal magnetic resonance cholangiopancreatography (MRCP)   . Choledocholithiasis   . E coli bacteremia   . Bacteremia due to Enterobacter species   . Bacteremia 12/27/2019  . QT prolongation 12/27/2019  . Nausea without vomiting   . Chronic pain disorder 08/29/2018  . Back pain, chronic 08/29/2018  . Nausea and vomiting in adult 02/25/2018  . Hypertension, essential, benign 01/18/2018  . Chronic biliary pancreatitis ([place]) 01/18/2018  . Chronic  abdominal pain 01/14/2018    Past Surgical History:  Procedure Laterality Date  . APPENDECTOMY    . CHOLECYSTECTOMY    . ERCP N/A 12/30/2019   Procedure: ENDOSCOPIC RETROGRADE CHOLANGIOPANCREATOGRAPHY (ERCP);  Surgeon: Milus Banister, MD;  Location: [facility];  Service: Endoscopy;  Laterality: N/A;  . ERCP W/ METAL STENT PLACEMENT    . KIDNEY STONE SURGERY    . REMOVAL OF STONES  12/30/2019   Procedure: REMOVAL OF STONES;  Surgeon: Milus Banister, MD;  Location: [facility];  Service: Endoscopy;;       Family History  Problem Relation Age of Onset  . Cancer Mother   . Early death Mother   . Hypertension Mother   . Heart failure Father   . Hypertension Father   . Heart disease Father   . Hyperlipidemia Father   . Pancreatic cancer Paternal Grandmother        possibly  . Pancreatic cancer Paternal Aunt   . Colon cancer Neg Hx     Social History   Tobacco Use  . Smoking status: Current Every Day Smoker    Packs/day: 0.50    Types: Cigarettes  . Smokeless tobacco: Never Used  Vaping Use  . Vaping Use: Never used  Substance Use Topics  . Alcohol use: Yes    Comment: rare  . Drug use: No    Home Medications Prior to Admission medications   Medication Sig Start Date End Date Taking? Authorizing Provider  amLODipine-benazepril (LOTREL) 5-20 MG capsule TAKE  1 CAPSULE BY MOUTH EVERY DAY 05/23/20   [name], Leonard G, MD  fluticasone [facility]) 50 MCG/ACT nasal spray Place 1 spray into both nostrils 2 (two) times daily. 04/03/19   [name], Leonard G, MD  metoprolol succinate (TOPROL-XL) 100 MG 24 hr tablet TAKE 1 TABLET (100 MG TOTAL) BY MOUTH DAILY. TAKE WITH OR IMMEDIATELY FOLLOWING A MEAL. 01/22/20   [name], Leonard G, MD  naloxone [facility]) nasal spray 4 mg/0.1 mL 1 spray in each nostril x 1 if opioid overdose. 06/08/20   [name], Leonard G, MD  nicotine (NICODERM CQ - DOSED IN MG/24 HOURS) 14 mg/24hr patch Place 1 patch (14 mg total) onto the skin daily. 12/31/19   Leonard Filler, MD  ondansetron (ZOFRAN ODT) 4 MG disintegrating tablet 4mg  ODT q4 hours prn nausea/vomit 06/29/20   Leonard Etienne, DO  ondansetron (ZOFRAN ODT) 8 MG disintegrating tablet Take 1 tablet (8 mg total) by mouth every 8 (eight) hours as needed for nausea or vomiting. 06/24/19   [name], Leonard G, MD  oxyCODONE ER [facility]) 9 MG C12A Take 1 tablet by mouth 2 (two) times daily. 10/05/20   [name], Leonard Sierras, MD  Oxycodone HCl 10 MG TABS 1 tablet twice daily as needed, can take an extra tablet if needed.  No more than 70 tablets/month. 09/28/20   [name], Leonard G, MD  promethazine (PHENERGAN) 25 MG suppository Place 1 suppository (25 mg total) rectally every 6 (six) hours as needed for nausea or vomiting. 06/29/20   Leonard Etienne, DO  promethazine (PHENERGAN) 25 MG tablet TAKE 1 TABLET BY MOUTH EVERY 12 HOURS AS NEEDED 09/23/20   [name], Leonard G, MD    Allergies    Cortisone, Eggs or egg-derived products, Ketorolac, Ketorolac tromethamine, Haldol [haloperidol lactate], Haloperidol, Hydrocortisone, Iodinated diagnostic agents, Ketorolac tromethamine, and Reglan [metoclopramide]  Review of Systems   Review of Systems  Gastrointestinal: Positive for abdominal pain, nausea and vomiting. Negative for constipation and diarrhea.  All other systems reviewed and are negative.   Physical Exam Updated Vital Signs BP (!) 148/103 (BP Location: Left Arm)   Pulse 100   Temp 98.4 F (36.9 C) (Oral)   Resp 18   Ht 6\' 1"  (1.854 m)   Wt 95.3 kg   SpO2 97%   BMI 27.71 kg/m   Physical Exam Vitals and nursing note reviewed.  Constitutional:      General: He is not in acute distress.    Appearance: He is well-developed. He is not diaphoretic.  HENT:     Head: Normocephalic and atraumatic.  Cardiovascular:     Rate and Rhythm: Normal rate and regular rhythm.     Heart sounds: No murmur heard. No friction rub.  Pulmonary:     Effort: Pulmonary effort is normal. No respiratory distress.     Breath sounds: Normal  breath sounds. No wheezing or rales.  Abdominal:     General: Bowel sounds are normal. There is no distension.     Palpations: Abdomen is soft.     Tenderness: There is abdominal tenderness in the epigastric area. There is no right CVA tenderness, left CVA tenderness, guarding or rebound.  Musculoskeletal:        General: Normal range of motion.     Cervical back: Normal range of motion and neck supple.  Skin:    General: Skin is warm and dry.  Neurological:     Mental Status: He is alert and oriented to person, place, and time.  Coordination: Coordination normal.     ED Results / Procedures / Treatments   Labs (all labs ordered are listed, but only abnormal results are displayed) Labs Reviewed  CBC - Abnormal; Notable for the following components:      Result Value   WBC 12.2 (*)    All other components within normal limits  LIPASE, BLOOD  COMPREHENSIVE METABOLIC PANEL  URINALYSIS, ROUTINE W REFLEX MICROSCOPIC    EKG None  Radiology No results found.  Procedures Procedures   Medications Ordered in ED Medications  sodium chloride 0.9 % bolus 1,000 mL (has no administration in time range)  ondansetron (ZOFRAN) injection 4 mg (has no administration in time range)  HYDROmorphone (DILAUDID) injection 1 mg (has no administration in time range)    ED Course  I have reviewed the triage vital signs and the nursing notes.  Pertinent labs & imaging results that were available during my care of the patient were reviewed by me and considered in my medical decision making (see chart for details).    MDM Rules/Calculators/A&P  Patient with history of chronic pancreatitis presenting with complaints of abdominal pain.  Patient's vital signs are stable, he is afebrile, and laboratory studies are very reassuring.  He has no white count and lipase is normal.  Patient given pain medicine and fluids and seems to be feeling better.  He will be discharged with follow-up with his GI  doctor.  Final Clinical Impression(s) / ED Diagnoses Final diagnoses:  None    Rx / DC Orders ED Discharge Orders    None       Veryl Speak, MD 10/19/20 0139

## 2020-10-19 LAB — URINALYSIS, ROUTINE W REFLEX MICROSCOPIC
Bilirubin Urine: NEGATIVE
Glucose, UA: NEGATIVE mg/dL
Hgb urine dipstick: NEGATIVE
Ketones, ur: NEGATIVE mg/dL
Leukocytes,Ua: NEGATIVE
Nitrite: NEGATIVE
Protein, ur: NEGATIVE mg/dL
Specific Gravity, Urine: 1.02 (ref 1.005–1.030)
pH: 6.5 (ref 5.0–8.0)

## 2020-10-19 MED ORDER — HYDROMORPHONE HCL 1 MG/ML IJ SOLN
1.0000 mg | Freq: Once | INTRAMUSCULAR | Status: AC
  Administered 2020-10-19: 1 mg via INTRAVENOUS
  Filled 2020-10-19: qty 1

## 2020-10-19 MED ORDER — HYDROMORPHONE HCL 1 MG/ML IJ SOLN
1.0000 mg | Freq: Once | INTRAMUSCULAR | Status: AC
Start: 2020-10-19 — End: 2020-10-19
  Administered 2020-10-19: 1 mg via INTRAVENOUS
  Filled 2020-10-19: qty 1

## 2020-10-19 NOTE — Discharge Instructions (Addendum)
Follow-up with your gastroenterologist in the next week.

## 2020-10-21 ENCOUNTER — Encounter (HOSPITAL_BASED_OUTPATIENT_CLINIC_OR_DEPARTMENT_OTHER): Payer: Self-pay

## 2020-10-21 ENCOUNTER — Other Ambulatory Visit: Payer: Self-pay

## 2020-10-21 ENCOUNTER — Emergency Department (HOSPITAL_BASED_OUTPATIENT_CLINIC_OR_DEPARTMENT_OTHER)
Admission: EM | Admit: 2020-10-21 | Discharge: 2020-10-21 | Disposition: A | Discharge status: Home / Self Care | Payer: BLUE CROSS/BLUE SHIELD | Attending: Emergency Medicine | Admitting: Emergency Medicine

## 2020-10-21 DIAGNOSIS — R197 Diarrhea, unspecified: Secondary | ICD-10-CM | POA: Insufficient documentation

## 2020-10-21 DIAGNOSIS — Z79899 Other long term (current) drug therapy: Secondary | ICD-10-CM | POA: Diagnosis not present

## 2020-10-21 DIAGNOSIS — R1013 Epigastric pain: Secondary | ICD-10-CM | POA: Diagnosis not present

## 2020-10-21 DIAGNOSIS — F17210 Nicotine dependence, cigarettes, uncomplicated: Secondary | ICD-10-CM | POA: Diagnosis not present

## 2020-10-21 DIAGNOSIS — R112 Nausea with vomiting, unspecified: Secondary | ICD-10-CM | POA: Diagnosis not present

## 2020-10-21 DIAGNOSIS — K861 Other chronic pancreatitis: Secondary | ICD-10-CM | POA: Diagnosis not present

## 2020-10-21 DIAGNOSIS — R9431 Abnormal electrocardiogram [ECG] [EKG]: Secondary | ICD-10-CM | POA: Diagnosis not present

## 2020-10-21 DIAGNOSIS — I10 Essential (primary) hypertension: Secondary | ICD-10-CM | POA: Diagnosis not present

## 2020-10-21 LAB — URINALYSIS, ROUTINE W REFLEX MICROSCOPIC
Bilirubin Urine: NEGATIVE
Glucose, UA: NEGATIVE mg/dL
Hgb urine dipstick: NEGATIVE
Ketones, ur: NEGATIVE mg/dL
Leukocytes,Ua: NEGATIVE
Nitrite: NEGATIVE
Protein, ur: NEGATIVE mg/dL
Specific Gravity, Urine: <1.005 — ABNORMAL LOW (ref 1.005–1.030)
pH: 6 (ref 5.0–8.0)

## 2020-10-21 LAB — COMPREHENSIVE METABOLIC PANEL
ALT: 37 U/L (ref 0–44)
AST: 25 U/L (ref 15–41)
Albumin: 4.5 g/dL (ref 3.5–5.0)
Alkaline Phosphatase: 80 U/L (ref 38–126)
Anion gap: 9 (ref 5–15)
BUN: 16 mg/dL (ref 6–20)
CO2: 25 mmol/L (ref 22–32)
Calcium: 9.8 mg/dL (ref 8.9–10.3)
Chloride: 105 mmol/L (ref 98–111)
Creatinine, Ser: 0.96 mg/dL (ref 0.61–1.24)
GFR, Estimated: >60 mL/min (ref >60)
Glucose, Bld: 101 mg/dL — ABNORMAL HIGH (ref 70–99)
Potassium: 3.4 mmol/L — ABNORMAL LOW (ref 3.5–5.1)
Sodium: 139 mmol/L (ref 135–145)
Total Bilirubin: 0.4 mg/dL (ref 0.3–1.2)
Total Protein: 7.8 g/dL (ref 6.5–8.1)

## 2020-10-21 LAB — CBC WITH DIFFERENTIAL/PLATELET
Abs Immature Granulocytes: 0.09 10*3/uL — ABNORMAL HIGH (ref 0.00–0.07)
Basophils Absolute: 0.1 10*3/uL (ref 0.0–0.1)
Basophils Relative: 1 %
Eosinophils Absolute: 0.3 10*3/uL (ref 0.0–0.5)
Eosinophils Relative: 3 %
HCT: 45.6 % (ref 39.0–52.0)
Hemoglobin: 15.9 g/dL (ref 13.0–17.0)
Immature Granulocytes: 1 %
Lymphocytes Relative: 32 %
Lymphs Abs: 3.9 10*3/uL (ref 0.7–4.0)
MCH: 30.3 pg (ref 26.0–34.0)
MCHC: 34.9 g/dL (ref 30.0–36.0)
MCV: 87 fL (ref 80.0–100.0)
Monocytes Absolute: 1.3 10*3/uL — ABNORMAL HIGH (ref 0.1–1.0)
Monocytes Relative: 10 %
Neutro Abs: 6.5 10*3/uL (ref 1.7–7.7)
Neutrophils Relative %: 53 %
Platelets: 310 10*3/uL (ref 150–400)
RBC: 5.24 MIL/uL (ref 4.22–5.81)
RDW: 12.7 % (ref 11.5–15.5)
WBC: 12.2 10*3/uL — ABNORMAL HIGH (ref 4.0–10.5)
nRBC: 0 % (ref 0.0–0.2)

## 2020-10-21 LAB — LIPASE, BLOOD: Lipase: 45 U/L (ref 11–51)

## 2020-10-21 MED ORDER — ONDANSETRON HCL 4 MG/2ML IJ SOLN: 4.0000 mg | Freq: Once | INTRAMUSCULAR | Status: DC

## 2020-10-21 MED ORDER — MORPHINE SULFATE (PF) 4 MG/ML IV SOLN
4.0000 mg | Freq: Once | INTRAVENOUS | Status: AC
Start: 2020-10-21 — End: 2020-10-21
  Administered 2020-10-21: 4 mg via INTRAVENOUS
  Filled 2020-10-21: qty 1

## 2020-10-21 MED ORDER — PROMETHAZINE HCL 25 MG/ML IJ SOLN
INTRAMUSCULAR | Status: AC
  Filled 2020-10-21: qty 1

## 2020-10-21 MED ORDER — MORPHINE SULFATE (PF) 4 MG/ML IV SOLN
8.0000 mg | Freq: Once | INTRAVENOUS | Status: AC
  Administered 2020-10-21: 8 mg via INTRAVENOUS
  Filled 2020-10-21: qty 2

## 2020-10-21 MED ORDER — SODIUM CHLORIDE 0.9 % IV SOLN
25.0000 mg | Freq: Four times a day (QID) | INTRAVENOUS | Status: DC | PRN
  Administered 2020-10-21: 25 mg via INTRAVENOUS
  Filled 2020-10-21: qty 1

## 2020-10-21 MED ORDER — SODIUM CHLORIDE 0.9 % IV BOLUS
1000.0000 mL | Freq: Once | INTRAVENOUS | Status: AC
  Administered 2020-10-21: 1000 mL via INTRAVENOUS

## 2020-10-21 MED ORDER — POTASSIUM CHLORIDE CRYS ER 20 MEQ PO TBCR
40.0000 meq | EXTENDED_RELEASE_TABLET | Freq: Once | ORAL | Status: AC
  Administered 2020-10-21: 40 meq via ORAL
  Filled 2020-10-21: qty 2

## 2020-10-21 NOTE — ED Triage Notes (Signed)
Pt arrives with c/o LUQ pain and vomiting X1 week states history of pancreatitis.

## 2020-10-21 NOTE — ED Provider Notes (Signed)
[facility] EMERGENCY DEPARTMENT Provider Note   CSN: [id number] Arrival date & time: 10/21/20  4332     History Chief Complaint  Patient presents with  . Abdominal Pain    Aron Inge is a 45 y.o. male.  HPI      45yo male with history of hypertension, chronic pancreatitis, e coli bacteremia, choledocolithiasis, chronic pain disorder, presents with concern for abdominal pain.   Presents with concern for epigastric abdominal pain.  He had been seen 3 days ago for the same.  Reports severe epigastric pain with radiation to the back that is been present for the past 4 days.  The pain feels similar to flares of his pancreatitis.  He has medications at home including oxycodone and Phenergan which he took without relief.  He denies alcohol use.  He has a history of cholecystectomy and appendectomy.  Denies fevers.  Reports he had nausea and a few episodes of vomiting, and has had difficulty time holding down his medication today.  Also reports a few episodes of diarrhea.  Reports he is going to be following up with Dr. Henrene Pastor of gastroenterology regarding his chronic pancreatitis.  Past Medical History:  Diagnosis Date  . Chronic abdominal pain   . Diverticulitis   . Drug-seeking behavior   . Hypertension   . Kidney stones   . Pancreatitis, chronic ([place]) 05/28/2005    Patient Active Problem List   Diagnosis Date Noted  . Hyperlipidemia, mixed 03/01/2020  . Abnormal magnetic resonance cholangiopancreatography (MRCP)   . Choledocholithiasis   . E coli bacteremia   . Bacteremia due to Enterobacter species   . Bacteremia 12/27/2019  . QT prolongation 12/27/2019  . Nausea without vomiting   . Chronic pain disorder 08/29/2018  . Back pain, chronic 08/29/2018  . Nausea and vomiting in adult 02/25/2018  . Hypertension, essential, benign 01/18/2018  . Chronic biliary pancreatitis ([place]) 01/18/2018  . Chronic abdominal pain 01/14/2018    Past Surgical History:   Procedure Laterality Date  . APPENDECTOMY    . CHOLECYSTECTOMY    . ERCP N/A 12/30/2019   Procedure: ENDOSCOPIC RETROGRADE CHOLANGIOPANCREATOGRAPHY (ERCP);  Surgeon: Milus Banister, MD;  Location: [facility];  Service: Endoscopy;  Laterality: N/A;  . ERCP W/ METAL STENT PLACEMENT    . KIDNEY STONE SURGERY    . REMOVAL OF STONES  12/30/2019   Procedure: REMOVAL OF STONES;  Surgeon: Milus Banister, MD;  Location: [facility];  Service: Endoscopy;;       Family History  Problem Relation Age of Onset  . Cancer Mother   . Early death Mother   . Hypertension Mother   . Heart failure Father   . Hypertension Father   . Heart disease Father   . Hyperlipidemia Father   . Pancreatic cancer Paternal Grandmother        possibly  . Pancreatic cancer Paternal Aunt   . Colon cancer Neg Hx     Social History   Tobacco Use  . Smoking status: Current Every Day Smoker    Packs/day: 0.50    Types: Cigarettes  . Smokeless tobacco: Never Used  Vaping Use  . Vaping Use: Never used  Substance Use Topics  . Alcohol use: Yes    Comment: rare  . Drug use: No    Home Medications Prior to Admission medications   Medication Sig Start Date End Date Taking? Authorizing Provider  amLODipine-benazepril (LOTREL) 5-20 MG capsule TAKE 1 CAPSULE BY MOUTH EVERY DAY 05/23/20  Martinique, Betty G, MD  fluticasone [facility]) 50 MCG/ACT nasal spray Place 1 spray into both nostrils 2 (two) times daily. 04/03/19   Martin[place], Betty G, MD  metoprolol succinate (TOPROL-XL) 100 MG 24 hr tablet TAKE 1 TABLET (100 MG TOTAL) BY MOUTH DAILY. TAKE WITH OR IMMEDIATELY FOLLOWING A MEAL. 01/22/20   Martinique, Betty G, MD  naloxone [facility]) nasal spray 4 mg/0.1 mL 1 spray in each nostril x 1 if opioid overdose. 06/08/20   Martin[place], Betty G, MD  nicotine (NICODERM CQ - DOSED IN MG/24 HOURS) 14 mg/24hr patch Place 1 patch (14 mg total) onto the skin daily. 12/31/19   Eugenie Filler, MD  ondansetron (ZOFRAN ODT) 4 MG disintegrating  tablet 4mg  ODT q4 hours prn nausea/vomit 06/29/20   Deno Etienne, DO  ondansetron (ZOFRAN ODT) 8 MG disintegrating tablet Take 1 tablet (8 mg total) by mouth every 8 (eight) hours as needed for nausea or vomiting. 06/24/19   Martinique, Betty G, MD  oxyCODONE ER [facility] ER) 9 MG C12A Take 1 tablet by mouth 2 (two) times daily. 10/05/20   Burchette, Alinda Sierras, MD  Oxycodone HCl 10 MG TABS 1 tablet twice daily as needed, can take an extra tablet if needed.  No more than 70 tablets/month. 09/28/20   [place], Betty G, MD  promethazine (PHENERGAN) 25 MG suppository Place 1 suppository (25 mg total) rectally every 6 (six) hours as needed for nausea or vomiting. 06/29/20   Deno Etienne, DO  promethazine (PHENERGAN) 25 MG tablet TAKE 1 TABLET BY MOUTH EVERY 12 HOURS AS NEEDED 09/23/20   [place], Betty G, MD    Allergies    Cortisone, Eggs or egg-derived products, Ketorolac, Ketorolac tromethamine, Haldol [haloperidol lactate], Haloperidol, Hydrocortisone, Iodinated diagnostic agents, Ketorolac tromethamine, and Reglan [metoclopramide]  Review of Systems   Review of Systems  Constitutional: Negative for fever.  HENT: Negative for congestion.   Respiratory: Negative for cough and shortness of breath.   Cardiovascular: Negative for chest pain.  Gastrointestinal: Positive for abdominal pain, diarrhea, nausea and vomiting. Negative for constipation.  Genitourinary: Negative for dysuria.  Musculoskeletal: Positive for back pain.  Skin: Negative for rash.  Neurological: Negative for headaches.    Physical Exam Updated Vital Signs BP (!) 132/91 (BP Location: Right Arm)   Pulse 71   Temp 98 F (36.7 C) (Oral)   Resp 18   Ht 6\' 1"  (1.854 m)   Wt 95.3 kg   SpO2 98%   BMI 27.71 kg/m   Physical Exam Vitals and nursing note reviewed.  Constitutional:      General: He is not in acute distress.    Appearance: He is well-developed. He is not diaphoretic.  HENT:     Head: Normocephalic and atraumatic.  Eyes:      Conjunctiva/sclera: Conjunctivae normal.  Cardiovascular:     Rate and Rhythm: Normal rate and regular rhythm.     Heart sounds: Normal heart sounds.  Pulmonary:     Effort: Pulmonary effort is normal. No respiratory distress.  Abdominal:     General: There is no distension.     Palpations: Abdomen is soft.     Tenderness: There is abdominal tenderness in the epigastric area. There is no guarding.  Musculoskeletal:     Cervical back: Normal range of motion.  Skin:    General: Skin is warm and dry.  Neurological:     Mental Status: He is alert and oriented to person, place, and time.     ED Results /  Procedures / Treatments   Labs (all labs ordered are listed, but only abnormal results are displayed) Labs Reviewed  CBC WITH DIFFERENTIAL/PLATELET - Abnormal; Notable for the following components:      Result Value   WBC 12.2 (*)    Monocytes Absolute 1.3 (*)    Abs Immature Granulocytes 0.09 (*)    All other components within normal limits  COMPREHENSIVE METABOLIC PANEL - Abnormal; Notable for the following components:   Potassium 3.4 (*)    Glucose, Bld 101 (*)    All other components within normal limits  URINALYSIS, ROUTINE W REFLEX MICROSCOPIC - Abnormal; Notable for the following components:   Specific Gravity, Urine <1.005 (*)    All other components within normal limits  LIPASE, BLOOD    EKG EKG Interpretation  Date/Time:  Friday Oct 21 2020 09:28:42 EDT Ventricular Rate:  78 PR Interval:  150 QRS Duration: 97 QT Interval:  393 QTC Calculation: 448 R Axis:   57 Text Interpretation: Sinus rhythm No significant change since last tracing Confirmed by Gareth Morgan [phone]) on 10/21/2020 11:06:33 AM   Radiology No results found.  Procedures Procedures   Medications Ordered in ED Medications  sodium chloride 0.9 % bolus 1,000 mL (0 mLs Intravenous Stopped 10/21/20 1105)  morphine 4 MG/ML injection 4 mg (4 mg Intravenous Given 10/21/20 1000)  morphine 4  MG/ML injection 8 mg (8 mg Intravenous Given 10/21/20 1137)  potassium chloride SA (KLOR-CON) CR tablet 40 mEq (40 mEq Oral Given 10/21/20 1131)    ED Course  I have reviewed the triage vital signs and the nursing notes.  Pertinent labs & imaging results that were available during my care of the patient were reviewed by me and considered in my medical decision making (see chart for details).    MDM Rules/Calculators/A&P                           45yo male with history of hypertension, chronic pancreatitis, e coli bacteremia, choledocolithiasis, chronic pain disorder, presents with concern for abdominal pain.  Has a history of appendectomy, cholecystectomy, lab work is not consistent with choledocholithiasis/cholangitis, history and exam not consistent with small bowel obstruction or mesenteric ischemia.  Suspect likely flare of chronic pancreatitis.  Morphine and nausea medicine as well as IV fluids in the emergency department with improvement.  He was given p.o. potassium.  Recommend continuing his home regimen and following up with gastroenterology. Final Clinical Impression(s) / ED Diagnoses Final diagnoses:  Epigastric pain  Chronic pancreatitis, unspecified pancreatitis type [facility])    Rx / DC Orders ED Discharge Orders    None       Gareth Morgan, MD 10/21/20 2252

## 2020-10-24 ENCOUNTER — Other Ambulatory Visit: Payer: Self-pay | Admitting: Family Medicine

## 2020-10-24 DIAGNOSIS — I10 Essential (primary) hypertension: Secondary | ICD-10-CM

## 2020-10-24 IMAGING — CT CT ABD-PELV W/O CM
2 of 4 series · 16 of 46 positions shown, 18 images · non-contrast
Comparison: 04/28/2018 CT abdomen/pelvis.

CLINICAL DATA: Left upper quadrant abdominal pain, nausea and
vomiting for several days. History of chronic pancreatitis.

EXAM:
CT ABDOMEN AND PELVIS WITHOUT CONTRAST
TECHNIQUE: Multidetector CT imaging of the abdomen and pelvis was performed
following the standard protocol without IV contrast.

[Series 2: axial st · axial · 0.76mm/px · z∈[+451,+896]mm · 13 of 99 slices shown, 15 images]
[im 5/99  soft-tissue]
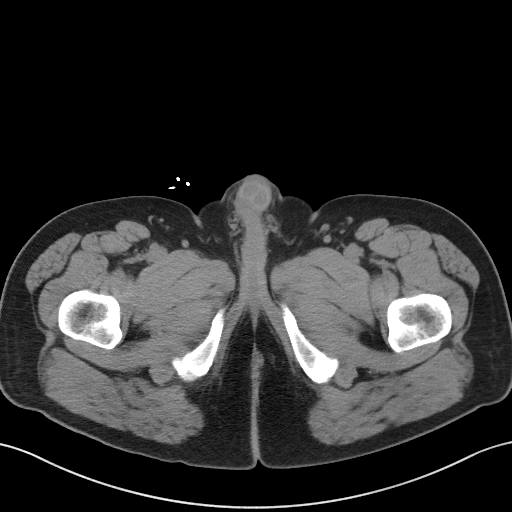
[im 5/99  bone]
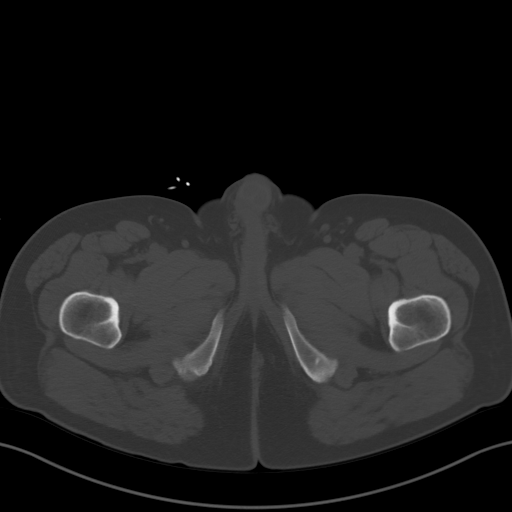
[im 13/99  soft-tissue]
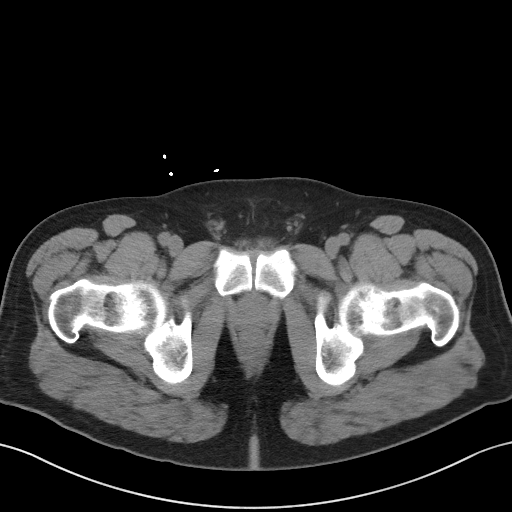
[im 21/99  soft-tissue]
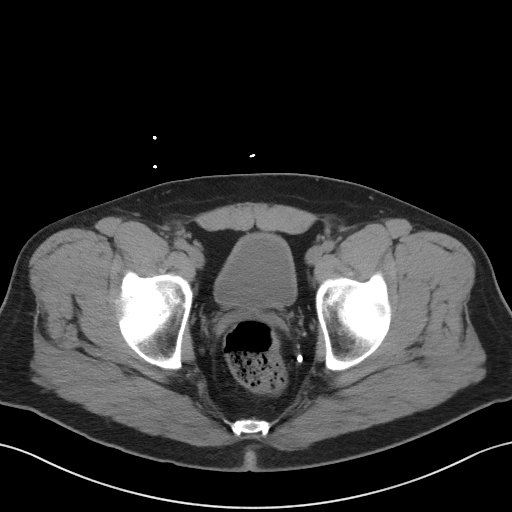
[im 29/99  soft-tissue]
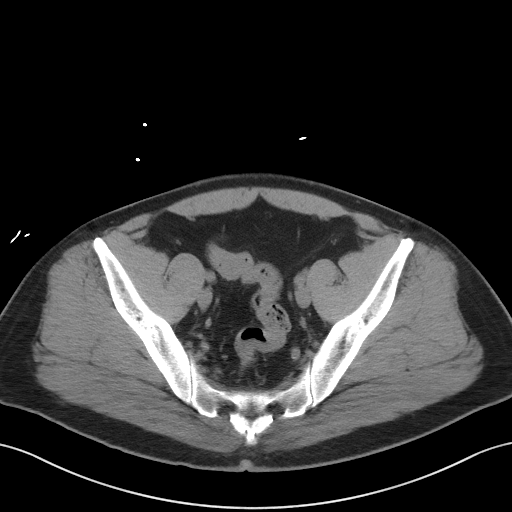
[im 33/99  soft-tissue]
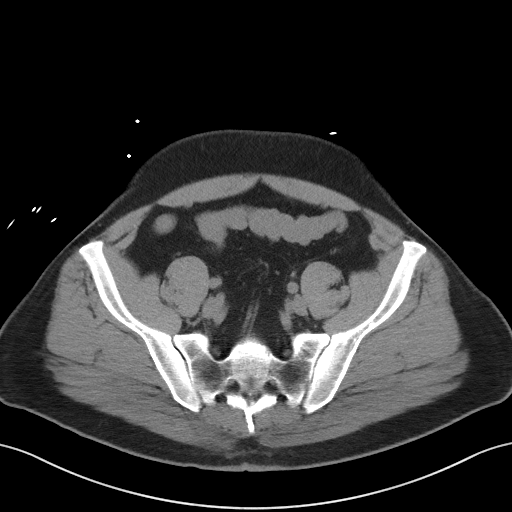
[im 41/99  soft-tissue]
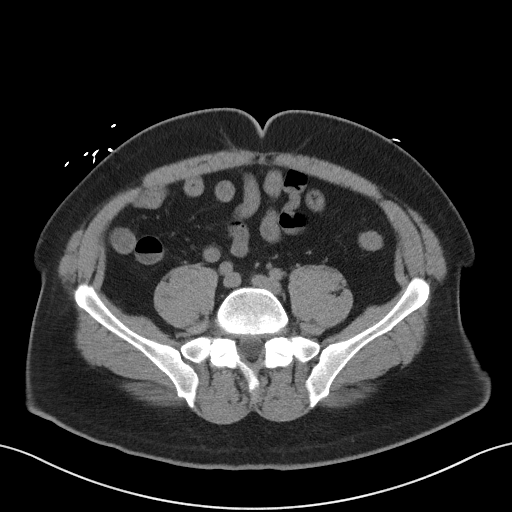
[im 50/99  soft-tissue]
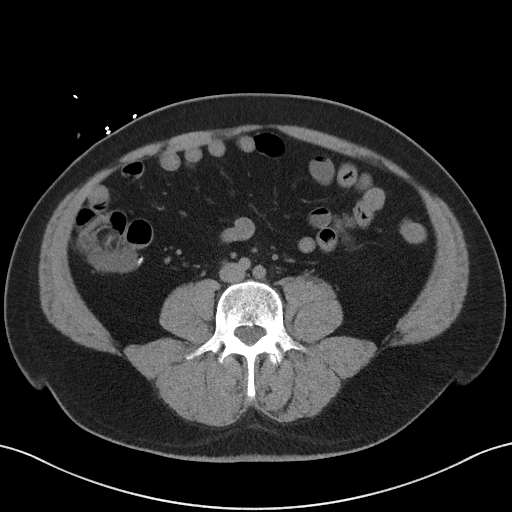
[im 58/99  soft-tissue]
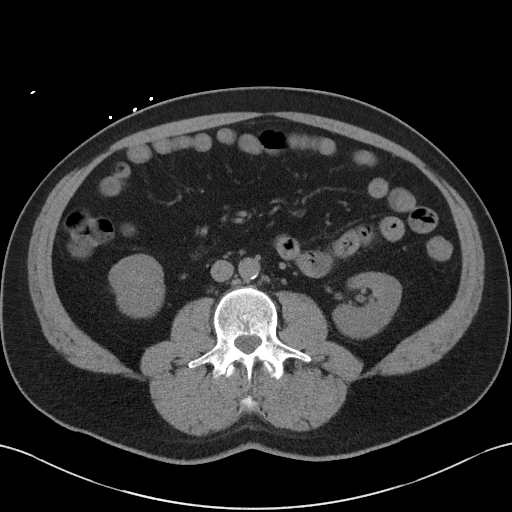
[im 66/99  soft-tissue]
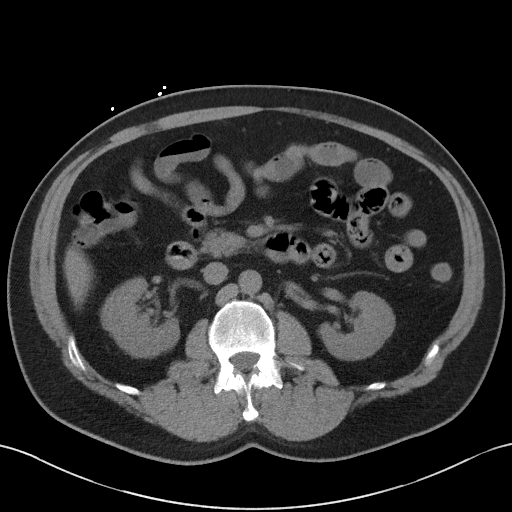
[im 66/99  bone]
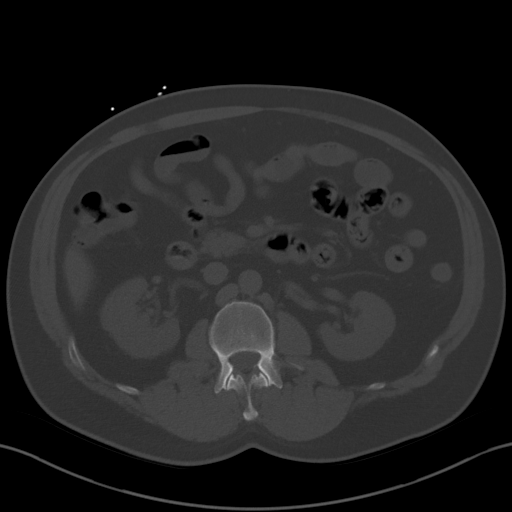
[im 70/99  soft-tissue]
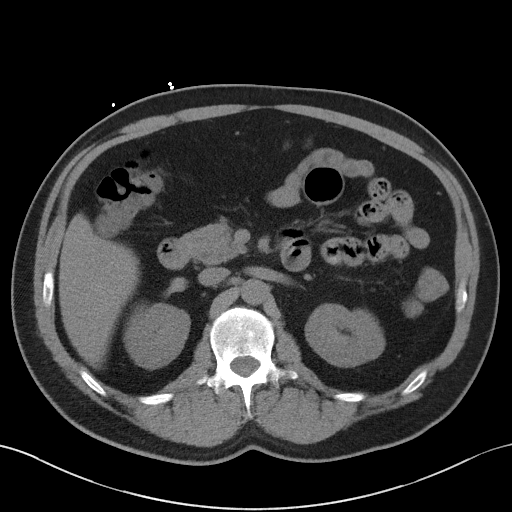
[im 78/99  soft-tissue]
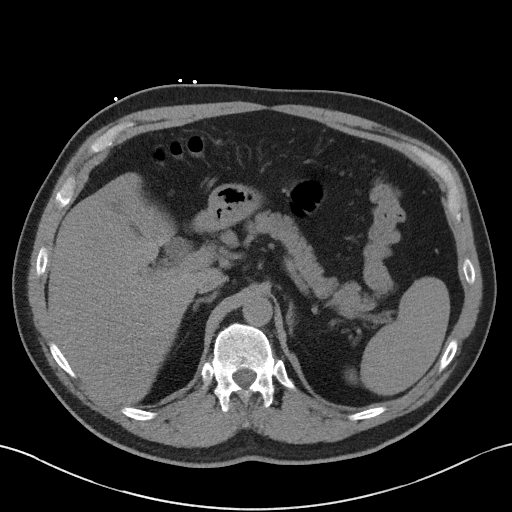
[im 86/99  soft-tissue]
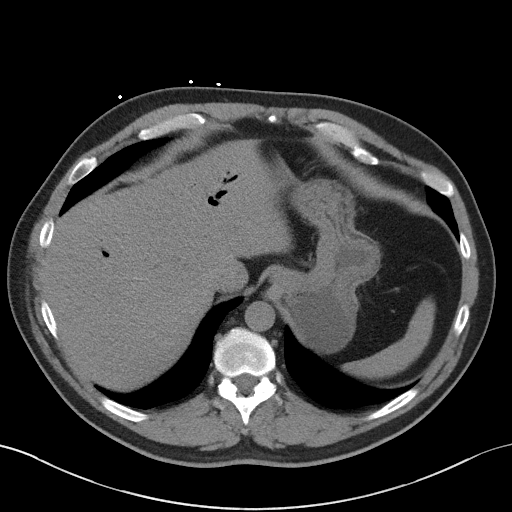
[im 94/99  soft-tissue]
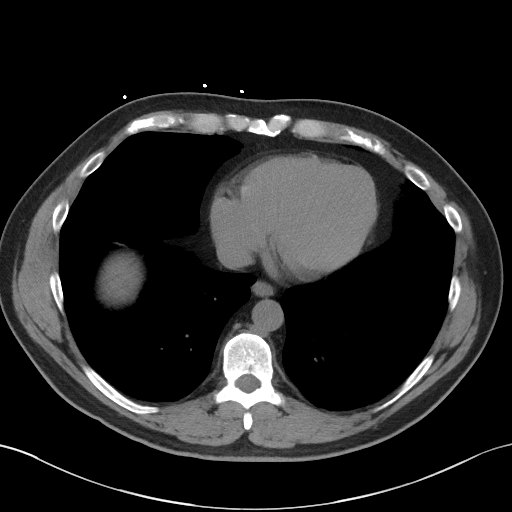

[Series 5: coronal st · coronal · 0.85mm/px · 3 of 97 slices shown]
[im 33/97  soft-tissue]
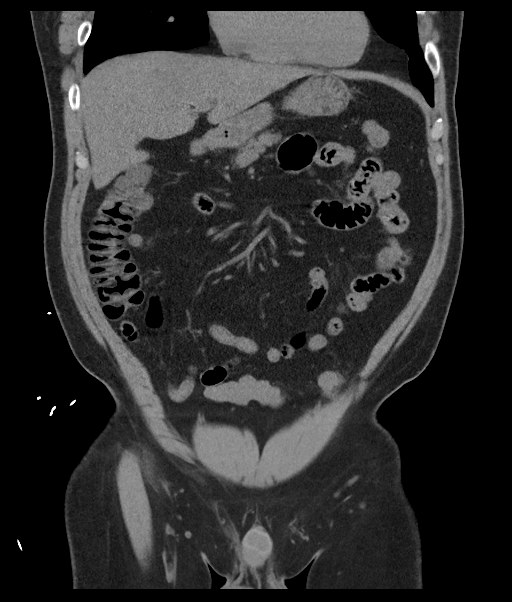
[im 43/97  soft-tissue]
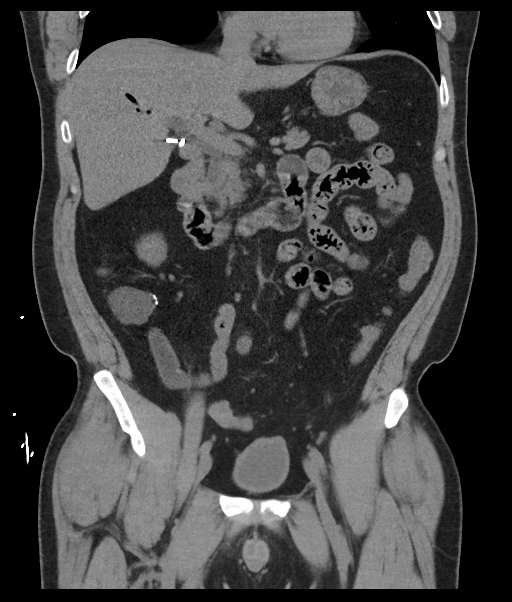
[im 54/97  soft-tissue]
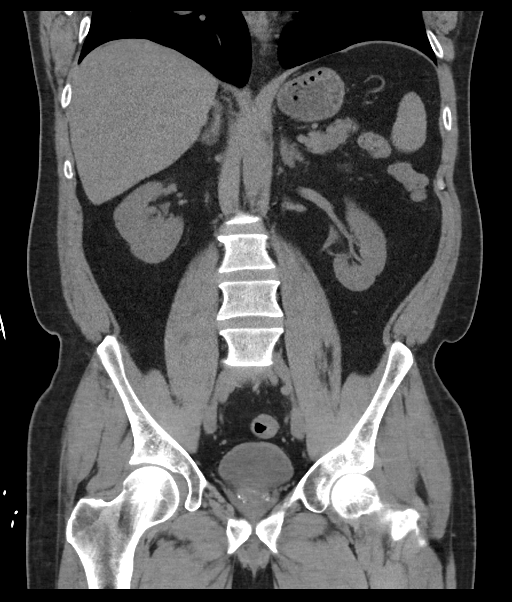

[16 of 46 positions shown; findings below may reference images not displayed]

FINDINGS: Lower chest: No significant pulmonary nodules or acute consolidative
airspace disease.

Hepatobiliary: Normal liver size. No liver mass. Cholecystectomy.
Bile ducts are stable in caliber and within normal post
cholecystectomy limits with CBD diameter 8 mm. Pneumobilia
throughout the intrahepatic bile ducts, which was present on prior
CT.

Pancreas: Normal, with no mass or duct dilation.

Spleen: Normal size. No mass.

Adrenals/Urinary Tract: Normal adrenals. No renal stones. No
hydronephrosis. No contour deforming renal masses. Normal
nondistended bladder.

Stomach/Bowel: Normal non-distended stomach. Normal caliber small
bowel with no small bowel wall thickening. Normal appendix. Normal
large bowel with no diverticulosis, large bowel wall thickening or
pericolonic fat stranding.

Vascular/Lymphatic: Atherosclerotic nonaneurysmal abdominal aorta.
No pathologically enlarged lymph nodes in the abdomen or pelvis.

Reproductive: Top-normal size prostate with nonspecific coarse
internal prostatic calcifications.

Other: No pneumoperitoneum, ascites or focal fluid collection.

Musculoskeletal: No aggressive appearing focal osseous lesions. Mild
thoracolumbar spondylosis.
IMPRESSION: No acute abnormality. No evidence of bowel obstruction or acute
bowel inflammation. Normal appendix.

Bile ducts are stable and within normal post cholecystectomy limits.
Pneumobilia, as on prior CT, presumably due to prior sphincterotomy.

Aortic Atherosclerosis ([id number]).

## 2020-10-29 ENCOUNTER — Other Ambulatory Visit: Payer: Self-pay | Admitting: Family Medicine

## 2020-10-29 DIAGNOSIS — G8929 Other chronic pain: Secondary | ICD-10-CM

## 2020-10-29 DIAGNOSIS — G894 Chronic pain syndrome: Secondary | ICD-10-CM

## 2020-11-01 ENCOUNTER — Encounter (HOSPITAL_BASED_OUTPATIENT_CLINIC_OR_DEPARTMENT_OTHER): Payer: Self-pay | Admitting: Emergency Medicine

## 2020-11-01 ENCOUNTER — Emergency Department (HOSPITAL_BASED_OUTPATIENT_CLINIC_OR_DEPARTMENT_OTHER)
Admission: EM | Admit: 2020-11-01 | Discharge: 2020-11-01 | Disposition: A | Discharge status: Home / Self Care | Payer: BLUE CROSS/BLUE SHIELD | Attending: Emergency Medicine | Admitting: Emergency Medicine

## 2020-11-01 ENCOUNTER — Other Ambulatory Visit: Payer: Self-pay

## 2020-11-01 DIAGNOSIS — Z8719 Personal history of other diseases of the digestive system: Secondary | ICD-10-CM | POA: Diagnosis not present

## 2020-11-01 DIAGNOSIS — E876 Hypokalemia: Secondary | ICD-10-CM | POA: Insufficient documentation

## 2020-11-01 DIAGNOSIS — R112 Nausea with vomiting, unspecified: Secondary | ICD-10-CM

## 2020-11-01 DIAGNOSIS — Z9049 Acquired absence of other specified parts of digestive tract: Secondary | ICD-10-CM | POA: Insufficient documentation

## 2020-11-01 DIAGNOSIS — R109 Unspecified abdominal pain: Secondary | ICD-10-CM | POA: Diagnosis not present

## 2020-11-01 DIAGNOSIS — I10 Essential (primary) hypertension: Secondary | ICD-10-CM | POA: Diagnosis not present

## 2020-11-01 DIAGNOSIS — R1084 Generalized abdominal pain: Secondary | ICD-10-CM | POA: Diagnosis not present

## 2020-11-01 DIAGNOSIS — R110 Nausea: Secondary | ICD-10-CM

## 2020-11-01 DIAGNOSIS — F17210 Nicotine dependence, cigarettes, uncomplicated: Secondary | ICD-10-CM | POA: Diagnosis not present

## 2020-11-01 DIAGNOSIS — Z79899 Other long term (current) drug therapy: Secondary | ICD-10-CM | POA: Diagnosis not present

## 2020-11-01 LAB — LIPASE, BLOOD: Lipase: 40 U/L (ref 11–51)

## 2020-11-01 LAB — COMPREHENSIVE METABOLIC PANEL
ALT: 23 U/L (ref 0–44)
AST: 22 U/L (ref 15–41)
Albumin: 4.6 g/dL (ref 3.5–5.0)
Alkaline Phosphatase: 78 U/L (ref 38–126)
Anion gap: 9 (ref 5–15)
BUN: 14 mg/dL (ref 6–20)
CO2: 24 mmol/L (ref 22–32)
Calcium: 9.3 mg/dL (ref 8.9–10.3)
Chloride: 107 mmol/L (ref 98–111)
Creatinine, Ser: 0.91 mg/dL (ref 0.61–1.24)
GFR, Estimated: >60 mL/min (ref >60)
Glucose, Bld: 110 mg/dL — ABNORMAL HIGH (ref 70–99)
Potassium: 3 mmol/L — ABNORMAL LOW (ref 3.5–5.1)
Sodium: 140 mmol/L (ref 135–145)
Total Bilirubin: 0.4 mg/dL (ref 0.3–1.2)
Total Protein: 7.7 g/dL (ref 6.5–8.1)

## 2020-11-01 LAB — CBC WITH DIFFERENTIAL/PLATELET
Abs Immature Granulocytes: 0.05 10*3/uL (ref 0.00–0.07)
Basophils Absolute: 0.1 10*3/uL (ref 0.0–0.1)
Basophils Relative: 1 %
Eosinophils Absolute: 0.4 10*3/uL (ref 0.0–0.5)
Eosinophils Relative: 3 %
HCT: 47.3 % (ref 39.0–52.0)
Hemoglobin: 16.7 g/dL (ref 13.0–17.0)
Immature Granulocytes: 0 %
Lymphocytes Relative: 32 %
Lymphs Abs: 3.9 10*3/uL (ref 0.7–4.0)
MCH: 30.4 pg (ref 26.0–34.0)
MCHC: 35.3 g/dL (ref 30.0–36.0)
MCV: 86.2 fL (ref 80.0–100.0)
Monocytes Absolute: 1.2 10*3/uL — ABNORMAL HIGH (ref 0.1–1.0)
Monocytes Relative: 9 %
Neutro Abs: 6.7 10*3/uL (ref 1.7–7.7)
Neutrophils Relative %: 55 %
Platelets: 255 10*3/uL (ref 150–400)
RBC: 5.49 MIL/uL (ref 4.22–5.81)
RDW: 12.5 % (ref 11.5–15.5)
WBC: 12.2 10*3/uL — ABNORMAL HIGH (ref 4.0–10.5)
nRBC: 0 % (ref 0.0–0.2)

## 2020-11-01 MED ORDER — SODIUM CHLORIDE 0.9 % IV SOLN
12.5000 mg | Freq: Once | INTRAVENOUS | Status: DC
  Filled 2020-11-01: qty 0.5

## 2020-11-01 MED ORDER — HYDROMORPHONE HCL 1 MG/ML IJ SOLN
1.0000 mg | Freq: Once | INTRAMUSCULAR | Status: AC
  Administered 2020-11-01: 1 mg via INTRAVENOUS
  Filled 2020-11-01: qty 1

## 2020-11-01 MED ORDER — LACTATED RINGERS IV BOLUS
1000.0000 mL | Freq: Once | INTRAVENOUS | Status: AC
  Administered 2020-11-01: 1000 mL via INTRAVENOUS

## 2020-11-01 MED ORDER — PROMETHAZINE HCL 25 MG/ML IJ SOLN
INTRAMUSCULAR | Status: AC
  Administered 2020-11-01: 12.5 mg via INTRAVENOUS
  Filled 2020-11-01: qty 1

## 2020-11-01 MED ORDER — POTASSIUM CHLORIDE CRYS ER 20 MEQ PO TBCR
40.0000 meq | EXTENDED_RELEASE_TABLET | Freq: Once | ORAL | Status: AC
  Administered 2020-11-01: 40 meq via ORAL
  Filled 2020-11-01: qty 2

## 2020-11-01 NOTE — ED Triage Notes (Signed)
Pt is c/o abd pain   Pt states hx of pancreatitis  Feels the same  Pt has had nausea and vomiting with the pain  Pt states this episode started Sunday night

## 2020-11-01 NOTE — ED Notes (Signed)
Per EDP order, pt given fluids and/or food for PO challenge. Pt verbalized understanding to utilize call bell if nausea or emesis occur. 

## 2020-11-01 NOTE — ED Provider Notes (Signed)
7:12 AM Care assumed from Dr. Dayna Barker.  At time of transfer care, patient is waiting for results of laboratory work-up and reassessment after medications.  Patient has a history of recurrent pancreatitis and reportedly told the previous team that this feels similar to prior pancreatitis flareups.  Initially, plan is to avoid imaging and focus on symptom management and lab testing to look for significant abnormalities.  10:45 AM Work-up returned overall reassuring and similar to prior.  Potassium slightly decreased and will give more potassium.  He will improve his diet and will maintain hydration at home.  He reports he already has nausea medicine is not anything else.  He was able to tolerate p.o. and feel safe going home.  Patient discharged in good condition with improved symptoms.  Clinical Impression: 1. Generalized abdominal pain   2. Nausea   3. Non-intractable vomiting with nausea, unspecified vomiting type   4. Hypokalemia     Disposition: Discharge  Condition: Good  I have discussed the results, Dx and Tx plan with the pt(& family if present). He/she/they expressed understanding and agree(s) with the plan. Discharge instructions discussed at great length. Strict return precautions discussed and pt &/or family have verbalized understanding of the instructions. No further questions at time of discharge.    New Prescriptions   No medications on file    Follow Up: [place]ique, Betty G, [place] [address] [phone]     [place] [address] [id number] [id number] [place] [phone]         Kaleah Hagemeister, Gwenyth Allegra, MD 11/01/20 1047

## 2020-11-01 NOTE — Discharge Instructions (Addendum)
Your work-up today was overall similar to prior aside from the slightly decreased potassium.  We repleted orally today.  Please maintain hydration and rest and stay hydrated.  If any symptoms change or worsen, please return to the nearest emergency department.

## 2020-11-01 NOTE — ED Provider Notes (Signed)
[facility] EMERGENCY DEPARTMENT Provider Note   CSN: [id number] Arrival date & time: 11/01/20  0503     History Chief Complaint  Patient presents with  . Abdominal Pain    Birt Reinoso is a 45 y.o. male.   Abdominal Pain Pain location:  Epigastric Pain quality: aching and sharp   Pain severity:  Moderate Onset quality:  Gradual Timing:  Intermittent Progression:  Worsening Chronicity:  Recurrent Context: not alcohol use   Relieved by:  None tried Worsened by:  Nothing Ineffective treatments:  None tried      Past Medical History:  Diagnosis Date  . Chronic abdominal pain   . Diverticulitis   . Drug-seeking behavior   . Hypertension   . Kidney stones   . Pancreatitis, chronic ([place]) 05/28/2005    Patient Active Problem List   Diagnosis Date Noted  . Hyperlipidemia, mixed 03/01/2020  . Abnormal magnetic resonance cholangiopancreatography (MRCP)   . Choledocholithiasis   . E coli bacteremia   . Bacteremia due to Enterobacter species   . Bacteremia 12/27/2019  . QT prolongation 12/27/2019  . Nausea without vomiting   . Chronic pain disorder 08/29/2018  . Back pain, chronic 08/29/2018  . Nausea and vomiting in adult 02/25/2018  . Hypertension, essential, benign 01/18/2018  . Chronic biliary pancreatitis ([place]) 01/18/2018  . Chronic abdominal pain 01/14/2018    Past Surgical History:  Procedure Laterality Date  . APPENDECTOMY    . CHOLECYSTECTOMY    . ERCP N/A 12/30/2019   Procedure: ENDOSCOPIC RETROGRADE CHOLANGIOPANCREATOGRAPHY (ERCP);  Surgeon: Milus Banister, MD;  Location: [facility];  Service: Endoscopy;  Laterality: N/A;  . ERCP W/ METAL STENT PLACEMENT    . KIDNEY STONE SURGERY    . REMOVAL OF STONES  12/30/2019   Procedure: REMOVAL OF STONES;  Surgeon: Milus Banister, MD;  Location: [facility];  Service: Endoscopy;;       Family History  Problem Relation Age of Onset  . Cancer Mother   . Early death Mother   .  Hypertension Mother   . Heart failure Father   . Hypertension Father   . Heart disease Father   . Hyperlipidemia Father   . Pancreatic cancer Paternal Grandmother        possibly  . Pancreatic cancer Paternal Aunt   . Colon cancer Neg Hx     Social History   Tobacco Use  . Smoking status: Current Every Day Smoker    Packs/day: 0.50    Types: Cigarettes  . Smokeless tobacco: Never Used  Vaping Use  . Vaping Use: Never used  Substance Use Topics  . Alcohol use: Not Currently    Comment: rare  . Drug use: No    Home Medications Prior to Admission medications   Medication Sig Start Date End Date Taking? Authorizing Provider  amLODipine-benazepril (LOTREL) 5-20 MG capsule TAKE 1 CAPSULE BY MOUTH EVERY DAY 05/23/20   Martin[place], Betty G, MD  fluticasone [facility]) 50 MCG/ACT nasal spray Place 1 spray into both nostrils 2 (two) times daily. 04/03/19   [place], Betty G, MD  metoprolol succinate (TOPROL-XL) 100 MG 24 hr tablet TAKE 1 TABLET (100 MG TOTAL) BY MOUTH DAILY. TAKE WITH OR IMMEDIATELY FOLLOWING A MEAL. 10/25/20   [place], Betty G, MD  naloxone [facility]) nasal spray 4 mg/0.1 mL 1 spray in each nostril x 1 if opioid overdose. 06/08/20   [place], Betty G, MD  nicotine (NICODERM CQ - DOSED IN MG/24 HOURS) 14 mg/24hr patch  Place 1 patch (14 mg total) onto the skin daily. 12/31/19   Eugenie Filler, MD  ondansetron (ZOFRAN ODT) 4 MG disintegrating tablet 4mg  ODT q4 hours prn nausea/vomit 06/29/20   Deno Etienne, DO  ondansetron (ZOFRAN ODT) 8 MG disintegrating tablet Take 1 tablet (8 mg total) by mouth every 8 (eight) hours as needed for nausea or vomiting. 06/24/19   [place], Betty G, MD  oxyCODONE ER [facility] ER) 9 MG C12A Take 1 tablet by mouth 2 (two) times daily. 10/05/20   Burchette, Alinda Sierras, MD  Oxycodone HCl 10 MG TABS 1 tablet twice daily as needed, can take an extra tablet if needed.  No more than 70 tablets/month. 09/28/20   [place], Betty G, MD  promethazine (PHENERGAN) 25 MG suppository  Place 1 suppository (25 mg total) rectally every 6 (six) hours as needed for nausea or vomiting. 06/29/20   Deno Etienne, DO  promethazine (PHENERGAN) 25 MG tablet TAKE 1 TABLET BY MOUTH EVERY 12 HOURS AS NEEDED 09/23/20   [place], Betty G, MD    Allergies    Cortisone, Eggs or egg-derived products, Ketorolac, Ketorolac tromethamine, Haldol [haloperidol lactate], Haloperidol, Hydrocortisone, Iodinated diagnostic agents, Ketorolac tromethamine, and Reglan [metoclopramide]  Review of Systems   Review of Systems  Gastrointestinal: Positive for abdominal pain.  All other systems reviewed and are negative.   Physical Exam Updated Vital Signs BP (!) 181/106 (BP Location: Right Arm)   Pulse 74   Temp 98.6 F (37 C) (Oral)   Resp 18   Ht 6\' 1"  (1.854 m)   Wt 93.4 kg   SpO2 99%   BMI 27.18 kg/m   Physical Exam Vitals and nursing note reviewed.  Constitutional:      Appearance: He is well-developed.  HENT:     Head: Normocephalic and atraumatic.  Cardiovascular:     Rate and Rhythm: Normal rate.  Pulmonary:     Effort: Pulmonary effort is normal. No respiratory distress.  Abdominal:     General: Bowel sounds are normal. There is no distension.     Tenderness: There is abdominal tenderness.  Musculoskeletal:        General: Normal range of motion.     Cervical back: Normal range of motion.  Skin:    General: Skin is warm and dry.  Neurological:     General: No focal deficit present.     Mental Status: He is alert.     ED Results / Procedures / Treatments   Labs (all labs ordered are listed, but only abnormal results are displayed) Labs Reviewed  CBC WITH DIFFERENTIAL/PLATELET  COMPREHENSIVE METABOLIC PANEL  LIPASE, BLOOD    EKG None  Radiology No results found.  Procedures Procedures   Medications Ordered in ED Medications  promethazine (PHENERGAN) 12.5 mg in sodium chloride 0.9 % 50 mL IVPB (has no administration in time range)  lactated ringers bolus 1,000 mL  (1,000 mLs Intravenous New Bag/Given 11/01/20 0619)  HYDROmorphone (DILAUDID) injection 1 mg (1 mg Intravenous Given 11/01/20 0619)  promethazine (PHENERGAN) 25 MG/ML injection (12.5 mg Intravenous Given 11/01/20 0631)    ED Course  I have reviewed the triage vital signs and the nursing notes.  Pertinent labs & imaging results that were available during my care of the patient were reviewed by me and considered in my medical decision making (see chart for details).    MDM Rules/Calculators/A&P  Suspect recurrent acute pain with chronic pancreatitis. Will treat for same pending labs.   Care transferred pending labs and reeval.  Final Clinical Impression(s) / ED Diagnoses Final diagnoses:  None    Rx / DC Orders ED Discharge Orders    None       Meztli Llanas, Corene Cornea, MD 11/01/20 [phone]

## 2020-11-01 NOTE — ED Notes (Signed)
Pt tolerated food/drink without nausea or emesis. States he is feeling much better than when he came in.

## 2020-11-02 MED ORDER — OXYCODONE HCL 10 MG PO TABS: ORAL_TABLET | ORAL | 0 refills | Status: DC

## 2020-11-06 ENCOUNTER — Other Ambulatory Visit: Payer: Self-pay | Admitting: Family Medicine

## 2020-11-06 DIAGNOSIS — G894 Chronic pain syndrome: Secondary | ICD-10-CM

## 2020-11-07 NOTE — Telephone Encounter (Signed)
Last filled : 10/07/20 Last OV: 07/26/20 Next OV: 11/09/20

## 2020-11-08 ENCOUNTER — Other Ambulatory Visit: Payer: Self-pay

## 2020-11-08 ENCOUNTER — Encounter: Payer: Self-pay | Admitting: Family Medicine

## 2020-11-09 ENCOUNTER — Telehealth (INDEPENDENT_AMBULATORY_CARE_PROVIDER_SITE_OTHER): Payer: BLUE CROSS/BLUE SHIELD | Admitting: Family Medicine

## 2020-11-09 VITALS — BP 128/88

## 2020-11-09 DIAGNOSIS — G8929 Other chronic pain: Secondary | ICD-10-CM

## 2020-11-09 DIAGNOSIS — I10 Essential (primary) hypertension: Secondary | ICD-10-CM

## 2020-11-09 DIAGNOSIS — R112 Nausea with vomiting, unspecified: Secondary | ICD-10-CM | POA: Diagnosis not present

## 2020-11-09 DIAGNOSIS — G894 Chronic pain syndrome: Secondary | ICD-10-CM

## 2020-11-09 DIAGNOSIS — R109 Unspecified abdominal pain: Secondary | ICD-10-CM

## 2020-11-09 DIAGNOSIS — E876 Hypokalemia: Secondary | ICD-10-CM

## 2020-11-09 MED ORDER — XTAMPZA ER 9 MG PO C12A: 1.0000 | EXTENDED_RELEASE_CAPSULE | Freq: Two times a day (BID) | ORAL | 0 refills | Status: DC

## 2020-11-09 NOTE — Progress Notes (Signed)
Virtual Visit via Video Note I connected with [name] on 11/09/20 by a video enabled telemedicine application and verified that I am speaking with the correct person using two identifiers.  Location patient: home Location provider:work or office Persons participating in the virtual visit: patient, provider  I discussed the limitations of evaluation and management by telemedicine and the availability of in person appointments. The patient expressed understanding and agreed to proceed. Chief Complaint  Patient presents with   Follow-up    No new concerns/ questions   HPI: [name] is a 45 yo male with hx of tobacco use,HTN,and chronic pain following on some of his chronic medical conditions. Last visit, virtual, on 07/26/20. 5 ED visits since his last visit. Epigastric abdominal pain,nausea,and vomiting. In the ER treated with Dilaudid 1 mg IV and promethazine 12.5 mg with IVF x 2.  For chronic nausea and vomiting he is on Phenergan 25 mg bid prn, Zofran 8 mg tid prn. He has not identified exacerbating or alleviating factors.  He did not tolerate Reglan a few years ago.  Chronic epigastric pain has been thought to be caused by chronic pancreatitis. GI evaluation, suggested possibility of radicular pain. Lumbar MRI was ordered but not done due to insurance issues. He is on Oxycodone ER 9 mg bid and Oxycodone 10 mg tid prn, max 70 tabs per month.  He was referred with pain management because did not feel like pain was adequately controlled with current management. Appt with pain management on 12/06/20.  HTN:BP elevated in the ED, 181/106 n 11/01/20.  Home BP's 120's/80's. He is on Metoprolol succinate 100 mg daily and Amlodipine-Benazepril 5-20 mg daily.  Negative for severe/frequent headache, visual changes, chest pain, dyspnea, palpitation, focal weakness, or edema.  Lab Results  Component Value Date   CREATININE 0.91 11/01/2020   BUN 14 11/01/2020   NA 140 11/01/2020   K 3.0 (L)  11/01/2020   CL 107 11/01/2020   CO2 24 11/01/2020   ROS: See pertinent positives and negatives per HPI.  Past Medical History:  Diagnosis Date   Chronic abdominal pain    Diverticulitis    Drug-seeking behavior    Hypertension    Kidney stones    Pancreatitis, chronic ([place]) 05/28/2005    Past Surgical History:  Procedure Laterality Date   APPENDECTOMY     CHOLECYSTECTOMY     ERCP N/A 12/30/2019   Procedure: ENDOSCOPIC RETROGRADE CHOLANGIOPANCREATOGRAPHY (ERCP);  Surgeon: Milus Banister, MD;  Location: [facility];  Service: Endoscopy;  Laterality: N/A;   ERCP W/ METAL STENT PLACEMENT     KIDNEY STONE SURGERY     REMOVAL OF STONES  12/30/2019   Procedure: REMOVAL OF STONES;  Surgeon: Milus Banister, MD;  Location: [facility];  Service: Endoscopy;;    Family History  Problem Relation Age of Onset   Cancer Mother    Early death Mother    Hypertension Mother    Heart failure Father    Hypertension Father    Heart disease Father    Hyperlipidemia Father    Pancreatic cancer Paternal Grandmother        possibly   Pancreatic cancer Paternal Aunt    Colon cancer Neg Hx     Social History   Socioeconomic History   Marital status: Married    Spouse name: Not on file   Number of children: Not on file   Years of education: Not on file   Highest education level: Not on file  Occupational History   Not on file  Tobacco Use   Smoking status: Every Day    Packs/day: 0.50    Pack years: 0.00    Types: Cigarettes   Smokeless tobacco: Never  Vaping Use   Vaping Use: Never used  Substance and Sexual Activity   Alcohol use: Not Currently    Comment: rare   Drug use: No   Sexual activity: Not on file  Other Topics Concern   Not on file  Social History Narrative   Not on file   Social Determinants of Health   Financial Resource Strain: Not on file  Food Insecurity: Not on file  Transportation Needs: Not on file  Physical Activity: Not on file  Stress: Not on  file  Social Connections: Not on file  Intimate Partner Violence: Not on file    Current Outpatient Medications:    fluticasone (FLONASE) 50 MCG/ACT nasal spray, Place 1 spray into both nostrils 2 (two) times daily., Disp: 16 g, Rfl: 2   metoprolol succinate (TOPROL-XL) 100 MG 24 hr tablet, TAKE 1 TABLET (100 MG TOTAL) BY MOUTH DAILY. TAKE WITH OR IMMEDIATELY FOLLOWING A MEAL., Disp: 90 tablet, Rfl: 1   naloxone (NARCAN) nasal spray 4 mg/0.1 mL, 1 spray in each nostril x 1 if opioid overdose., Disp: 1 each, Rfl: 1   nicotine (NICODERM CQ - DOSED IN MG/24 HOURS) 14 mg/24hr patch, Place 1 patch (14 mg total) onto the skin daily., Disp: 28 patch, Rfl: 0   ondansetron (ZOFRAN ODT) 8 MG disintegrating tablet, Take 1 tablet (8 mg total) by mouth every 8 (eight) hours as needed for nausea or vomiting., Disp: 20 tablet, Rfl: 1   Oxycodone HCl 10 MG TABS, 1 tablet twice daily as needed, can take an extra tablet if needed.  No more than 70 tablets/month., Disp: 70 tablet, Rfl: 0   promethazine (PHENERGAN) 25 MG suppository, Place 1 suppository (25 mg total) rectally every 6 (six) hours as needed for nausea or vomiting., Disp: 12 each, Rfl: 0   promethazine (PHENERGAN) 25 MG tablet, TAKE 1 TABLET BY MOUTH EVERY 12 HOURS AS NEEDED, Disp: 45 tablet, Rfl: 1   amLODipine-benazepril (LOTREL) 5-20 MG capsule, TAKE 1 CAPSULE BY MOUTH EVERY DAY, Disp: 90 capsule, Rfl: 2   oxyCODONE ER (XTAMPZA ER) 9 MG C12A, Take 1 tablet by mouth 2 (two) times daily., Disp: 60 capsule, Rfl: 0  EXAM:  VITALS per patient if applicable:BP 366/44 (BP Location: Right Arm, Patient Position: Sitting, Cuff Size: Normal)   GENERAL: alert, oriented, appears well and in no acute distress  HEENT: atraumatic, conjunctiva clear, no obvious abnormalities on inspection.  NECK: normal movements of the head and neck  LUNGS: on inspection no signs of respiratory distress, breathing rate appears normal, no obvious gross SOB, gasping or  wheezing  CV: no obvious cyanosis  MS: moves all visible extremities without noticeable abnormality  PSYCH/NEURO: pleasant and cooperative, no obvious depression or anxiety, speech and thought processing grossly intact  ASSESSMENT AND PLAN:  Discussed the following assessment and plan:  Chronic abdominal pain No clear etiology. He has had more exacerbations since his last visit.  Continue Oxycodone ER 9 mg bid and Oxycodone 10 mg tid prn. Some side effects have been discussed.  Chronic pain disorder Med contract 09/28/19. [place] controlled subs report reviewed. We discussed current guidelines. He has an appt with pain management to take over.  Hypertension, essential, benign BP readings at home when he is not having severe pain  in normal range. Continue Metoprolol succinate ,Amlodipine,and Benazepril same dose. Low salt diet. Continue monitoring BP regularly.  Nausea and vomiting in adult Gastroparesis. He has not tolerated Reglan. Continue Zofran and Phenergan same dose.  Hypokalemia He is not vomiting now, so K+ rich diet recommended for now.  I discussed the assessment and treatment plan with the patient. Mr. Dyar was provided an opportunity to ask questions and all were answered. He agreed with the plan and demonstrated an understanding of the instructions.  Return in about 5 months (around 04/11/2021) for HTN.   Jashun Puertas [place], MD

## 2020-11-12 ENCOUNTER — Encounter: Payer: Self-pay | Admitting: Family Medicine

## 2020-11-12 MED ORDER — AMLODIPINE BESY-BENAZEPRIL HCL 5-20 MG PO CAPS: ORAL_CAPSULE | ORAL | 2 refills | Status: DC

## 2020-11-25 ENCOUNTER — Encounter (HOSPITAL_BASED_OUTPATIENT_CLINIC_OR_DEPARTMENT_OTHER): Payer: Self-pay | Admitting: Emergency Medicine

## 2020-11-25 ENCOUNTER — Emergency Department (HOSPITAL_BASED_OUTPATIENT_CLINIC_OR_DEPARTMENT_OTHER)
Admission: EM | Admit: 2020-11-25 | Discharge: 2020-11-25 | Disposition: A | Discharge status: Home / Self Care | Payer: BLUE CROSS/BLUE SHIELD | Attending: Emergency Medicine | Admitting: Emergency Medicine

## 2020-11-25 ENCOUNTER — Other Ambulatory Visit: Payer: Self-pay

## 2020-11-25 ENCOUNTER — Encounter: Payer: Self-pay | Admitting: Emergency Medicine

## 2020-11-25 DIAGNOSIS — R1013 Epigastric pain: Secondary | ICD-10-CM

## 2020-11-25 DIAGNOSIS — G8929 Other chronic pain: Secondary | ICD-10-CM | POA: Diagnosis not present

## 2020-11-25 DIAGNOSIS — K861 Other chronic pancreatitis: Secondary | ICD-10-CM | POA: Diagnosis not present

## 2020-11-25 DIAGNOSIS — F17210 Nicotine dependence, cigarettes, uncomplicated: Secondary | ICD-10-CM | POA: Insufficient documentation

## 2020-11-25 DIAGNOSIS — Z79899 Other long term (current) drug therapy: Secondary | ICD-10-CM | POA: Insufficient documentation

## 2020-11-25 DIAGNOSIS — I10 Essential (primary) hypertension: Secondary | ICD-10-CM | POA: Insufficient documentation

## 2020-11-25 DIAGNOSIS — K5792 Diverticulitis of intestine, part unspecified, without perforation or abscess without bleeding: Secondary | ICD-10-CM | POA: Insufficient documentation

## 2020-11-25 DIAGNOSIS — N200 Calculus of kidney: Secondary | ICD-10-CM | POA: Insufficient documentation

## 2020-11-25 DIAGNOSIS — Z765 Malingerer [conscious simulation]: Secondary | ICD-10-CM | POA: Insufficient documentation

## 2020-11-25 DIAGNOSIS — Z8719 Personal history of other diseases of the digestive system: Secondary | ICD-10-CM

## 2020-11-25 LAB — COMPREHENSIVE METABOLIC PANEL
ALT: 29 U/L (ref 0–44)
AST: 24 U/L (ref 15–41)
Albumin: 4.2 g/dL (ref 3.5–5.0)
Alkaline Phosphatase: 74 U/L (ref 38–126)
Anion gap: 7 (ref 5–15)
BUN: 11 mg/dL (ref 6–20)
CO2: 26 mmol/L (ref 22–32)
Calcium: 9.2 mg/dL (ref 8.9–10.3)
Chloride: 106 mmol/L (ref 98–111)
Creatinine, Ser: 0.88 mg/dL (ref 0.61–1.24)
GFR, Estimated: >60 mL/min (ref >60)
Glucose, Bld: 94 mg/dL (ref 70–99)
Potassium: 3.5 mmol/L (ref 3.5–5.1)
Sodium: 139 mmol/L (ref 135–145)
Total Bilirubin: 0.3 mg/dL (ref 0.3–1.2)
Total Protein: 7.3 g/dL (ref 6.5–8.1)

## 2020-11-25 LAB — CBC WITH DIFFERENTIAL/PLATELET
Abs Immature Granulocytes: 0.06 10*3/uL (ref 0.00–0.07)
Basophils Absolute: 0.1 10*3/uL (ref 0.0–0.1)
Basophils Relative: 1 %
Eosinophils Absolute: 0.4 10*3/uL (ref 0.0–0.5)
Eosinophils Relative: 3 %
HCT: 44.7 % (ref 39.0–52.0)
Hemoglobin: 15.8 g/dL (ref 13.0–17.0)
Immature Granulocytes: 1 %
Lymphocytes Relative: 31 %
Lymphs Abs: 3.9 10*3/uL (ref 0.7–4.0)
MCH: 30.1 pg (ref 26.0–34.0)
MCHC: 35.3 g/dL (ref 30.0–36.0)
MCV: 85.1 fL (ref 80.0–100.0)
Monocytes Absolute: 1.2 10*3/uL — ABNORMAL HIGH (ref 0.1–1.0)
Monocytes Relative: 10 %
Neutro Abs: 6.9 10*3/uL (ref 1.7–7.7)
Neutrophils Relative %: 54 %
Platelets: 272 10*3/uL (ref 150–400)
RBC: 5.25 MIL/uL (ref 4.22–5.81)
RDW: 12.6 % (ref 11.5–15.5)
WBC: 12.5 10*3/uL — ABNORMAL HIGH (ref 4.0–10.5)
nRBC: 0 % (ref 0.0–0.2)

## 2020-11-25 LAB — LIPASE, BLOOD: Lipase: 34 U/L (ref 11–51)

## 2020-11-25 MED ORDER — ONDANSETRON HCL 4 MG/2ML IJ SOLN
4.0000 mg | Freq: Once | INTRAMUSCULAR | Status: AC
  Administered 2020-11-25: 4 mg via INTRAVENOUS
  Filled 2020-11-25: qty 2

## 2020-11-25 MED ORDER — HYDROMORPHONE HCL 1 MG/ML IJ SOLN
2.0000 mg | Freq: Once | INTRAMUSCULAR | Status: AC
  Administered 2020-11-25: 2 mg via INTRAVENOUS
  Filled 2020-11-25: qty 2

## 2020-11-25 MED ORDER — HYDROMORPHONE HCL 1 MG/ML IJ SOLN
1.0000 mg | Freq: Once | INTRAMUSCULAR | Status: AC
Start: 2020-11-25 — End: 2020-11-25
  Administered 2020-11-25: 1 mg via INTRAVENOUS
  Filled 2020-11-25: qty 1

## 2020-11-25 MED ORDER — SODIUM CHLORIDE 0.9 % IV BOLUS
1000.0000 mL | Freq: Once | INTRAVENOUS | Status: AC
  Administered 2020-11-25: 1000 mL via INTRAVENOUS

## 2020-11-25 NOTE — Discharge Instructions (Addendum)
Continue medications as previously prescribed.  Follow-up with gastroenterology/pain management as previously recommended.

## 2020-11-25 NOTE — ED Provider Notes (Signed)
[place] EMERGENCY DEPARTMENT Provider Note   CSN: [id number] Arrival date & time: 11/25/20  0215     History Chief Complaint  Patient presents with   Abdominal Pain    [name] is a 45 y.o. male.  Patient is a 45 year old male with history of chronic abdominal pain.  Patient well-known to the emergency department for multiple and frequent visits for the same.  He has some sort of chronic pancreatitis and is followed by GI.  He tells me he has an upcoming appointment with pain management.  He denies fevers or chills.  He denies vomiting but does feel nauseated.  The history is provided by the patient.  Abdominal Pain Pain location:  Epigastric Pain quality: stabbing   Pain radiates to:  Does not radiate Pain severity:  Severe Onset quality:  Sudden Duration:  1 day Timing:  Constant Progression:  Worsening Chronicity:  Chronic Relieved by:  Nothing Worsened by:  Nothing     Past Medical History:  Diagnosis Date   Chronic abdominal pain    Diverticulitis    Drug-seeking behavior    Hypertension    Kidney stones    Pancreatitis, chronic ([place]) 05/28/2005    Patient Active Problem List   Diagnosis Date Noted   Drug-seeking behavior 11/25/2020   Diverticulitis 11/25/2020   Kidney stones 11/25/2020   Hyperlipidemia, mixed 03/01/2020   Abnormal magnetic resonance cholangiopancreatography (MRCP)    Choledocholithiasis    E coli bacteremia    Bacteremia due to Enterobacter species    Bacteremia 12/27/2019   QT prolongation 12/27/2019   Nausea without vomiting    Chronic pain disorder 08/29/2018   Back pain, chronic 08/29/2018   Nausea and vomiting in adult 02/25/2018   Hypertension, essential, benign 01/18/2018   Chronic biliary pancreatitis ([place]) 01/18/2018   Chronic abdominal pain 01/14/2018    Past Surgical History:  Procedure Laterality Date   APPENDECTOMY     CHOLECYSTECTOMY     ERCP N/A 12/30/2019   Procedure: ENDOSCOPIC RETROGRADE  CHOLANGIOPANCREATOGRAPHY (ERCP);  Surgeon: Milus Banister, MD;  Location: [facility];  Service: Endoscopy;  Laterality: N/A;   ERCP W/ METAL STENT PLACEMENT     KIDNEY STONE SURGERY     REMOVAL OF STONES  12/30/2019   Procedure: REMOVAL OF STONES;  Surgeon: Milus Banister, MD;  Location: [facility];  Service: Endoscopy;;       Family History  Problem Relation Age of Onset   Cancer Mother    Early death Mother    Hypertension Mother    Heart failure Father    Hypertension Father    Heart disease Father    Hyperlipidemia Father    Pancreatic cancer Paternal Grandmother        possibly   Pancreatic cancer Paternal Aunt    Colon cancer Neg Hx     Social History   Tobacco Use   Smoking status: Every Day    Packs/day: 0.50    Pack years: 0.00    Types: Cigarettes   Smokeless tobacco: Never  Vaping Use   Vaping Use: Never used  Substance Use Topics   Alcohol use: Not Currently    Comment: rare   Drug use: No    Home Medications Prior to Admission medications   Medication Sig Start Date End Date Taking? Authorizing Provider  amLODipine-benazepril (LOTREL) 5-20 MG capsule TAKE 1 CAPSULE BY MOUTH EVERY DAY 11/12/20   [place], Betty G, MD  fluticasone [facility]) 50 MCG/ACT nasal spray  Place 1 spray into both nostrils 2 (two) times daily. 04/03/19   [place], Betty G, MD  metoprolol succinate (TOPROL-XL) 100 MG 24 hr tablet TAKE 1 TABLET (100 MG TOTAL) BY MOUTH DAILY. TAKE WITH OR IMMEDIATELY FOLLOWING A MEAL. 10/25/20   [place], Betty G, MD  naloxone [facility]) nasal spray 4 mg/0.1 mL 1 spray in each nostril x 1 if opioid overdose. 06/08/20   [place], Betty G, MD  nicotine (NICODERM CQ - DOSED IN MG/24 HOURS) 14 mg/24hr patch Place 1 patch (14 mg total) onto the skin daily. 12/31/19   Eugenie Filler, MD  ondansetron (ZOFRAN ODT) 8 MG disintegrating tablet Take 1 tablet (8 mg total) by mouth every 8 (eight) hours as needed for nausea or vomiting. 06/24/19   [place], Betty G, MD   oxyCODONE ER [facility]) 9 MG C12A Take 1 tablet by mouth 2 (two) times daily. 11/09/20   [place], Betty G, MD  Oxycodone HCl 10 MG TABS 1 tablet twice daily as needed, can take an extra tablet if needed.  No more than 70 tablets/month. 11/02/20   [place], Betty G, MD  promethazine (PHENERGAN) 25 MG suppository Place 1 suppository (25 mg total) rectally every 6 (six) hours as needed for nausea or vomiting. 06/29/20   Deno Etienne, DO  promethazine (PHENERGAN) 25 MG tablet TAKE 1 TABLET BY MOUTH EVERY 12 HOURS AS NEEDED 09/23/20   [place], Betty G, MD    Allergies    Cortisone, Eggs or egg-derived products, Ketorolac, Ketorolac tromethamine, Haldol [haloperidol lactate], Haloperidol, Hydrocortisone, Iodinated diagnostic agents, Ketorolac tromethamine, and Reglan [metoclopramide]  Review of Systems   Review of Systems  Gastrointestinal:  Positive for abdominal pain.  All other systems reviewed and are negative.  Physical Exam Updated Vital Signs BP (!) 150/96   Pulse 90   Temp 98.4 F (36.9 C) (Oral)   Resp 16   Ht 6\' 1"  (1.854 m)   Wt 95.3 kg   SpO2 98%   BMI 27.71 kg/m   Physical Exam Vitals and nursing note reviewed.  Constitutional:      General: He is not in acute distress.    Appearance: He is well-developed. He is not diaphoretic.  HENT:     Head: Normocephalic and atraumatic.  Cardiovascular:     Rate and Rhythm: Normal rate and regular rhythm.     Heart sounds: No murmur heard.   No friction rub.  Pulmonary:     Effort: Pulmonary effort is normal. No respiratory distress.     Breath sounds: Normal breath sounds. No wheezing or rales.  Abdominal:     General: Bowel sounds are normal. There is no distension.     Palpations: Abdomen is soft.     Tenderness: There is abdominal tenderness in the epigastric area. There is no right CVA tenderness, left CVA tenderness, guarding or rebound.  Musculoskeletal:        General: Normal range of motion.     Cervical back: Normal range  of motion and neck supple.  Skin:    General: Skin is warm and dry.  Neurological:     Mental Status: He is alert and oriented to person, place, and time.     Coordination: Coordination normal.    ED Results / Procedures / Treatments   Labs (all labs ordered are listed, but only abnormal results are displayed) Labs Reviewed - No data to display  EKG None  Radiology No results found.  Procedures Procedures   Medications Ordered in  ED Medications  sodium chloride 0.9 % bolus 1,000 mL (has no administration in time range)  ondansetron (ZOFRAN) injection 4 mg (has no administration in time range)  HYDROmorphone (DILAUDID) injection 2 mg (has no administration in time range)    ED Course  I have reviewed the triage vital signs and the nursing notes.  Pertinent labs & imaging results that were available during my care of the patient were reviewed by me and considered in my medical decision making (see chart for details).    MDM Rules/Calculators/A&P  Patient well-known to the emergency department for chronic abdominal pain.  He carries the diagnosis of chronic pancreatitis, but his lipase is normal and is consistently normal.  Patient's pain has been treated.  His vital signs are stable and laboratory studies are completely unremarkable.  He is to be discharged with follow-up with his gastroenterologist/chronic pain management.  Final Clinical Impression(s) / ED Diagnoses Final diagnoses:  None    Rx / DC Orders ED Discharge Orders     None        Veryl Speak, MD 11/25/20 [phone]

## 2020-11-25 NOTE — ED Triage Notes (Signed)
Pt c/o abd pain with vomiting, symptoms consistent with previous pancreatitis.

## 2020-11-28 ENCOUNTER — Other Ambulatory Visit: Payer: Self-pay | Admitting: Family Medicine

## 2020-11-28 DIAGNOSIS — F17210 Nicotine dependence, cigarettes, uncomplicated: Secondary | ICD-10-CM | POA: Diagnosis not present

## 2020-11-28 DIAGNOSIS — Z886 Allergy status to analgesic agent status: Secondary | ICD-10-CM | POA: Diagnosis not present

## 2020-11-28 DIAGNOSIS — R4182 Altered mental status, unspecified: Secondary | ICD-10-CM | POA: Diagnosis not present

## 2020-11-28 DIAGNOSIS — R109 Unspecified abdominal pain: Secondary | ICD-10-CM | POA: Diagnosis not present

## 2020-11-28 DIAGNOSIS — Z87442 Personal history of urinary calculi: Secondary | ICD-10-CM | POA: Diagnosis not present

## 2020-11-28 DIAGNOSIS — K861 Other chronic pancreatitis: Secondary | ICD-10-CM | POA: Diagnosis not present

## 2020-11-28 DIAGNOSIS — Z91012 Allergy to eggs: Secondary | ICD-10-CM | POA: Diagnosis not present

## 2020-11-28 DIAGNOSIS — Z9689 Presence of other specified functional implants: Secondary | ICD-10-CM | POA: Diagnosis not present

## 2020-11-28 DIAGNOSIS — R531 Weakness: Secondary | ICD-10-CM | POA: Diagnosis not present

## 2020-11-28 DIAGNOSIS — R112 Nausea with vomiting, unspecified: Secondary | ICD-10-CM | POA: Diagnosis not present

## 2020-11-28 DIAGNOSIS — E876 Hypokalemia: Secondary | ICD-10-CM | POA: Diagnosis not present

## 2020-11-28 DIAGNOSIS — I10 Essential (primary) hypertension: Secondary | ICD-10-CM | POA: Diagnosis not present

## 2020-11-28 DIAGNOSIS — Z888 Allergy status to other drugs, medicaments and biological substances status: Secondary | ICD-10-CM | POA: Diagnosis not present

## 2020-11-28 DIAGNOSIS — G894 Chronic pain syndrome: Secondary | ICD-10-CM

## 2020-11-28 DIAGNOSIS — R1110 Vomiting, unspecified: Secondary | ICD-10-CM | POA: Diagnosis not present

## 2020-11-28 DIAGNOSIS — Z79899 Other long term (current) drug therapy: Secondary | ICD-10-CM | POA: Diagnosis not present

## 2020-11-28 DIAGNOSIS — R1013 Epigastric pain: Secondary | ICD-10-CM | POA: Diagnosis not present

## 2020-11-28 DIAGNOSIS — G8929 Other chronic pain: Secondary | ICD-10-CM

## 2020-11-29 ENCOUNTER — Other Ambulatory Visit: Payer: Self-pay | Admitting: Family Medicine

## 2020-11-29 DIAGNOSIS — R112 Nausea with vomiting, unspecified: Secondary | ICD-10-CM

## 2020-11-29 DIAGNOSIS — K861 Other chronic pancreatitis: Secondary | ICD-10-CM

## 2020-11-30 ENCOUNTER — Encounter: Payer: Self-pay | Admitting: Family Medicine

## 2020-11-30 MED ORDER — OXYCODONE HCL 10 MG PO TABS: ORAL_TABLET | ORAL | 0 refills | Status: DC

## 2020-12-06 ENCOUNTER — Encounter: Payer: BLUE CROSS/BLUE SHIELD | Admitting: Physical Medicine and Rehabilitation

## 2020-12-06 ENCOUNTER — Ambulatory Visit: Payer: BLUE CROSS/BLUE SHIELD | Admitting: Physical Medicine and Rehabilitation

## 2020-12-07 ENCOUNTER — Other Ambulatory Visit: Payer: Self-pay | Admitting: Family Medicine

## 2020-12-07 DIAGNOSIS — G894 Chronic pain syndrome: Secondary | ICD-10-CM

## 2020-12-08 DIAGNOSIS — Z886 Allergy status to analgesic agent status: Secondary | ICD-10-CM | POA: Diagnosis not present

## 2020-12-08 DIAGNOSIS — R1010 Upper abdominal pain, unspecified: Secondary | ICD-10-CM | POA: Diagnosis not present

## 2020-12-08 DIAGNOSIS — Z87442 Personal history of urinary calculi: Secondary | ICD-10-CM | POA: Diagnosis not present

## 2020-12-08 DIAGNOSIS — K2900 Acute gastritis without bleeding: Secondary | ICD-10-CM | POA: Diagnosis not present

## 2020-12-08 DIAGNOSIS — Z888 Allergy status to other drugs, medicaments and biological substances status: Secondary | ICD-10-CM | POA: Diagnosis not present

## 2020-12-08 DIAGNOSIS — K861 Other chronic pancreatitis: Secondary | ICD-10-CM | POA: Diagnosis not present

## 2020-12-08 DIAGNOSIS — R112 Nausea with vomiting, unspecified: Secondary | ICD-10-CM | POA: Diagnosis not present

## 2020-12-08 DIAGNOSIS — I10 Essential (primary) hypertension: Secondary | ICD-10-CM | POA: Diagnosis not present

## 2020-12-08 DIAGNOSIS — R5383 Other fatigue: Secondary | ICD-10-CM | POA: Diagnosis not present

## 2020-12-08 DIAGNOSIS — Z91012 Allergy to eggs: Secondary | ICD-10-CM | POA: Diagnosis not present

## 2020-12-08 DIAGNOSIS — F17210 Nicotine dependence, cigarettes, uncomplicated: Secondary | ICD-10-CM | POA: Diagnosis not present

## 2020-12-09 ENCOUNTER — Encounter: Payer: Self-pay | Admitting: Family Medicine

## 2020-12-11 DIAGNOSIS — I10 Essential (primary) hypertension: Secondary | ICD-10-CM | POA: Diagnosis not present

## 2020-12-11 DIAGNOSIS — Z87442 Personal history of urinary calculi: Secondary | ICD-10-CM | POA: Diagnosis not present

## 2020-12-11 DIAGNOSIS — F17210 Nicotine dependence, cigarettes, uncomplicated: Secondary | ICD-10-CM | POA: Diagnosis not present

## 2020-12-11 DIAGNOSIS — R1084 Generalized abdominal pain: Secondary | ICD-10-CM | POA: Diagnosis not present

## 2020-12-11 DIAGNOSIS — R109 Unspecified abdominal pain: Secondary | ICD-10-CM | POA: Diagnosis not present

## 2020-12-11 DIAGNOSIS — K838 Other specified diseases of biliary tract: Secondary | ICD-10-CM | POA: Diagnosis not present

## 2020-12-12 MED ORDER — XTAMPZA ER 9 MG PO C12A: 1.0000 | EXTENDED_RELEASE_CAPSULE | Freq: Two times a day (BID) | ORAL | 0 refills | Status: DC

## 2020-12-25 ENCOUNTER — Emergency Department (HOSPITAL_BASED_OUTPATIENT_CLINIC_OR_DEPARTMENT_OTHER)
Admission: EM | Admit: 2020-12-25 | Discharge: 2020-12-25 | Disposition: A | Discharge status: Home / Self Care | Payer: BLUE CROSS/BLUE SHIELD | Attending: Emergency Medicine | Admitting: Emergency Medicine

## 2020-12-25 ENCOUNTER — Other Ambulatory Visit: Payer: Self-pay

## 2020-12-25 DIAGNOSIS — F17210 Nicotine dependence, cigarettes, uncomplicated: Secondary | ICD-10-CM | POA: Diagnosis not present

## 2020-12-25 DIAGNOSIS — Z79899 Other long term (current) drug therapy: Secondary | ICD-10-CM | POA: Diagnosis not present

## 2020-12-25 DIAGNOSIS — R112 Nausea with vomiting, unspecified: Secondary | ICD-10-CM | POA: Insufficient documentation

## 2020-12-25 DIAGNOSIS — E876 Hypokalemia: Secondary | ICD-10-CM

## 2020-12-25 DIAGNOSIS — R1010 Upper abdominal pain, unspecified: Secondary | ICD-10-CM | POA: Diagnosis not present

## 2020-12-25 DIAGNOSIS — G8929 Other chronic pain: Secondary | ICD-10-CM

## 2020-12-25 DIAGNOSIS — I10 Essential (primary) hypertension: Secondary | ICD-10-CM | POA: Diagnosis not present

## 2020-12-25 DIAGNOSIS — K861 Other chronic pancreatitis: Secondary | ICD-10-CM

## 2020-12-25 DIAGNOSIS — R1013 Epigastric pain: Secondary | ICD-10-CM | POA: Insufficient documentation

## 2020-12-25 LAB — COMPREHENSIVE METABOLIC PANEL
ALT: 41 U/L (ref 0–44)
AST: 19 U/L (ref 15–41)
Albumin: 4.3 g/dL (ref 3.5–5.0)
Alkaline Phosphatase: 80 U/L (ref 38–126)
Anion gap: 11 (ref 5–15)
BUN: 16 mg/dL (ref 6–20)
CO2: 24 mmol/L (ref 22–32)
Calcium: 9.8 mg/dL (ref 8.9–10.3)
Chloride: 105 mmol/L (ref 98–111)
Creatinine, Ser: 0.76 mg/dL (ref 0.61–1.24)
GFR, Estimated: >60 mL/min (ref >60)
Glucose, Bld: 99 mg/dL (ref 70–99)
Potassium: 3.3 mmol/L — ABNORMAL LOW (ref 3.5–5.1)
Sodium: 140 mmol/L (ref 135–145)
Total Bilirubin: 0.6 mg/dL (ref 0.3–1.2)
Total Protein: 7.3 g/dL (ref 6.5–8.1)

## 2020-12-25 LAB — CBC
HCT: 43.8 % (ref 39.0–52.0)
Hemoglobin: 15.5 g/dL (ref 13.0–17.0)
MCH: 29.6 pg (ref 26.0–34.0)
MCHC: 35.4 g/dL (ref 30.0–36.0)
MCV: 83.6 fL (ref 80.0–100.0)
Platelets: 296 10*3/uL (ref 150–400)
RBC: 5.24 MIL/uL (ref 4.22–5.81)
RDW: 12.7 % (ref 11.5–15.5)
WBC: 14.5 10*3/uL — ABNORMAL HIGH (ref 4.0–10.5)
nRBC: 0 % (ref 0.0–0.2)

## 2020-12-25 LAB — LIPASE, BLOOD: Lipase: 81 U/L — ABNORMAL HIGH (ref 11–51)

## 2020-12-25 MED ORDER — ONDANSETRON HCL 4 MG/2ML IJ SOLN
4.0000 mg | Freq: Once | INTRAMUSCULAR | Status: AC
  Administered 2020-12-25: 4 mg via INTRAVENOUS
  Filled 2020-12-25: qty 2

## 2020-12-25 MED ORDER — ONDANSETRON 8 MG PO TBDP: 8.0000 mg | ORAL_TABLET | Freq: Three times a day (TID) | ORAL | 0 refills | Status: DC | PRN

## 2020-12-25 MED ORDER — ALUM & MAG HYDROXIDE-SIMETH 200-200-20 MG/5ML PO SUSP
30.0000 mL | Freq: Once | ORAL | Status: AC
  Administered 2020-12-25: 30 mL via ORAL
  Filled 2020-12-25: qty 30

## 2020-12-25 MED ORDER — SODIUM CHLORIDE 0.9 % IV BOLUS
1000.0000 mL | Freq: Once | INTRAVENOUS | Status: AC
  Administered 2020-12-25: 1000 mL via INTRAVENOUS

## 2020-12-25 MED ORDER — PANTOPRAZOLE SODIUM 40 MG PO TBEC: 40.0000 mg | DELAYED_RELEASE_TABLET | Freq: Every day | ORAL | 0 refills | Status: DC

## 2020-12-25 MED ORDER — POTASSIUM CHLORIDE CRYS ER 20 MEQ PO TBCR
40.0000 meq | EXTENDED_RELEASE_TABLET | Freq: Once | ORAL | Status: AC
  Administered 2020-12-25: 40 meq via ORAL
  Filled 2020-12-25: qty 2

## 2020-12-25 MED ORDER — HYDROMORPHONE HCL 1 MG/ML IJ SOLN
1.0000 mg | Freq: Once | INTRAMUSCULAR | Status: AC
  Administered 2020-12-25: 1 mg via INTRAVENOUS
  Filled 2020-12-25: qty 1

## 2020-12-25 MED ORDER — FAMOTIDINE 20 MG PO TABS
20.0000 mg | ORAL_TABLET | Freq: Once | ORAL | Status: AC
  Administered 2020-12-25: 20 mg via ORAL
  Filled 2020-12-25: qty 1

## 2020-12-25 NOTE — Discharge Instructions (Addendum)
It was our pleasure to provide your ER care today - we hope that you feel better.  Drink adequate fluids/stay well hydrated.  Take zofran as need for nausea.  Take your pain medication as need.  Take acid blocker medication (protonix) as prescribed.   Follow up with primary care doctor in the next 2-3 days if symptoms fail to improve/resolve.   Your potassium level is mildly low - eat plenty of fruits and vegetables, eat balanced diet, and follow up with primary care doctor. Also, your blood pressure is high - continue your meds, limit salt intake, and follow up with primary care doctor in the coming week.   Return to ER if worse, new symptoms, fevers, new, worsening, or severe pain, persistent vomiting, or other concern.   You were given pain meds in the ER - no driving for the next 6 hours, or if/when taking opiate pain meds.

## 2020-12-25 NOTE — ED Provider Notes (Signed)
[facility] EMERGENCY DEPT Provider Note   CSN: [id number] Arrival date & time: 12/25/20  1238     History Chief Complaint  Patient presents with   Abdominal Pain    [name] is a 45 y.o. male.  Pt indicates hx pancreatitis/chronic pancreatitis, and with epigastric pain in past 1-2 days. Symptoms acute onset, moderate, constant, persistent, dull, occasionally radiates to back - same as prior pancreatitis pain. +nausea and few episodes emesis, no bloody or bilious emesis. No abd distension. Having normal bms. No dysuria or gu c/o. No chest pain or sob. No fever or chills. States has not been able to tolerate his normal home pain meds.   The history is provided by the patient and medical records.  Abdominal Pain Associated symptoms: nausea and vomiting   Associated symptoms: no chest pain, no chills, no constipation, no diarrhea, no dysuria, no fever, no shortness of breath and no sore throat       Past Medical History:  Diagnosis Date   Chronic abdominal pain    Diverticulitis    Drug-seeking behavior    Hypertension    Kidney stones    Pancreatitis, chronic ([place]) 05/28/2005    Patient Active Problem List   Diagnosis Date Noted   Drug-seeking behavior 11/25/2020   Diverticulitis 11/25/2020   Kidney stones 11/25/2020   Hyperlipidemia, mixed 03/01/2020   Abnormal magnetic resonance cholangiopancreatography (MRCP)    Choledocholithiasis    E coli bacteremia    Bacteremia due to Enterobacter species    Bacteremia 12/27/2019   QT prolongation 12/27/2019   Nausea without vomiting    Chronic pain disorder 08/29/2018   Back pain, chronic 08/29/2018   Nausea and vomiting in adult 02/25/2018   Hypertension, essential, benign 01/18/2018   Chronic biliary pancreatitis ([place]) 01/18/2018   Chronic abdominal pain 01/14/2018    Past Surgical History:  Procedure Laterality Date   APPENDECTOMY     CHOLECYSTECTOMY     ERCP N/A 12/30/2019   Procedure:  ENDOSCOPIC RETROGRADE CHOLANGIOPANCREATOGRAPHY (ERCP);  Surgeon: Milus Banister, MD;  Location: [facility];  Service: Endoscopy;  Laterality: N/A;   ERCP W/ METAL STENT PLACEMENT     KIDNEY STONE SURGERY     REMOVAL OF STONES  12/30/2019   Procedure: REMOVAL OF STONES;  Surgeon: Milus Banister, MD;  Location: [facility];  Service: Endoscopy;;       Family History  Problem Relation Age of Onset   Cancer Mother    Early death Mother    Hypertension Mother    Heart failure Father    Hypertension Father    Heart disease Father    Hyperlipidemia Father    Pancreatic cancer Paternal Grandmother        possibly   Pancreatic cancer Paternal Aunt    Colon cancer Neg Hx     Social History   Tobacco Use   Smoking status: Every Day    Packs/day: 0.50    Types: Cigarettes   Smokeless tobacco: Never  Vaping Use   Vaping Use: Never used  Substance Use Topics   Alcohol use: Not Currently    Comment: rare   Drug use: No    Home Medications Prior to Admission medications   Medication Sig Start Date End Date Taking? Authorizing Provider  amLODipine-benazepril (LOTREL) 5-20 MG capsule TAKE 1 CAPSULE BY MOUTH EVERY DAY 11/12/20   [place], Betty G, MD  fluticasone [facility]) 50 MCG/ACT nasal spray Place 1 spray into both nostrils 2 (two) times  daily. 04/03/19   [place], Betty G, MD  metoprolol succinate (TOPROL-XL) 100 MG 24 hr tablet TAKE 1 TABLET (100 MG TOTAL) BY MOUTH DAILY. TAKE WITH OR IMMEDIATELY FOLLOWING A MEAL. 10/25/20   [place], Betty G, MD  naloxone [facility]) nasal spray 4 mg/0.1 mL 1 spray in each nostril x 1 if opioid overdose. 06/08/20   [place], Betty G, MD  nicotine (NICODERM CQ - DOSED IN MG/24 HOURS) 14 mg/24hr patch Place 1 patch (14 mg total) onto the skin daily. 12/31/19   Eugenie Filler, MD  ondansetron (ZOFRAN ODT) 8 MG disintegrating tablet Take 1 tablet (8 mg total) by mouth every 8 (eight) hours as needed for nausea or vomiting. 06/24/19   [place], Betty G, MD   oxyCODONE ER [facility]) 9 MG C12A Take 1 tablet by mouth 2 (two) times daily. 12/12/20   [place], Betty G, MD  Oxycodone HCl 10 MG TABS 1 tablet twice daily as needed, can take an extra tablet if needed.  No more than 70 tablets/month. 11/30/20   [place], Betty G, MD  promethazine (PHENERGAN) 25 MG suppository Place 1 suppository (25 mg total) rectally every 6 (six) hours as needed for nausea or vomiting. 06/29/20   Deno Etienne, DO  promethazine (PHENERGAN) 25 MG tablet TAKE 1 TABLET BY MOUTH EVERY 12 HOURS AS NEEDED 11/30/20   [place], Betty G, MD    Allergies    Cortisone, Eggs or egg-derived products, Ketorolac, Ketorolac tromethamine, Haldol [haloperidol lactate], Haloperidol, Hydrocortisone, Iodinated diagnostic agents, Ketorolac tromethamine, and Reglan [metoclopramide]  Review of Systems   Review of Systems  Constitutional:  Negative for chills and fever.  HENT:  Negative for sore throat.   Eyes:  Negative for redness.  Respiratory:  Negative for shortness of breath.   Cardiovascular:  Negative for chest pain.  Gastrointestinal:  Positive for abdominal pain, nausea and vomiting. Negative for constipation and diarrhea.  Genitourinary:  Negative for dysuria and flank pain.  Musculoskeletal:  Negative for back pain and neck pain.  Skin:  Negative for rash.  Neurological:  Negative for headaches.  Hematological:  Does not bruise/bleed easily.  Psychiatric/Behavioral:  Negative for confusion.    Physical Exam Updated Vital Signs BP (!) 159/108 (BP Location: Right Arm)   Pulse 80   Temp 98.4 F (36.9 C)   Resp 16   SpO2 98%   Physical Exam Vitals and nursing note reviewed.  Constitutional:      Appearance: Normal appearance. He is well-developed.  HENT:     Head: Atraumatic.     Nose: Nose normal.     Mouth/Throat:     Mouth: Mucous membranes are moist.     Pharynx: Oropharynx is clear.  Eyes:     General: No scleral icterus.    Conjunctiva/sclera: Conjunctivae normal.  Neck:      Trachea: No tracheal deviation.  Cardiovascular:     Rate and Rhythm: Normal rate and regular rhythm.     Pulses: Normal pulses.     Heart sounds: Normal heart sounds. No murmur heard.   No friction rub. No gallop.  Pulmonary:     Effort: Pulmonary effort is normal. No accessory muscle usage or respiratory distress.     Breath sounds: Normal breath sounds.  Abdominal:     General: Bowel sounds are normal. There is no distension.     Palpations: Abdomen is soft. There is no mass.     Tenderness: There is no abdominal tenderness. There is no guarding or rebound.  Hernia: No hernia is present.  Genitourinary:    Comments: No cva tenderness. Musculoskeletal:        General: No swelling.     Cervical back: Normal range of motion and neck supple. No rigidity.  Skin:    General: Skin is warm and dry.     Findings: No rash.  Neurological:     Mental Status: He is alert.     Comments: Alert, speech clear.   Psychiatric:        Mood and Affect: Mood normal.    ED Results / Procedures / Treatments   Labs (all labs ordered are listed, but only abnormal results are displayed) Results for orders placed or performed during the hospital encounter of 12/25/20  Lipase, blood  Result Value Ref Range   Lipase 81 (H) 11 - 51 U/L  Comprehensive metabolic panel  Result Value Ref Range   Sodium 140 135 - 145 mmol/L   Potassium 3.3 (L) 3.5 - 5.1 mmol/L   Chloride 105 98 - 111 mmol/L   CO2 24 22 - 32 mmol/L   Glucose, Bld 99 70 - 99 mg/dL   BUN 16 6 - 20 mg/dL   Creatinine, Ser 0.76 0.61 - 1.24 mg/dL   Calcium 9.8 8.9 - 10.3 mg/dL   Total Protein 7.3 6.5 - 8.1 g/dL   Albumin 4.3 3.5 - 5.0 g/dL   AST 19 15 - 41 U/L   ALT 41 0 - 44 U/L   Alkaline Phosphatase 80 38 - 126 U/L   Total Bilirubin 0.6 0.3 - 1.2 mg/dL   GFR, Estimated >60 >60 mL/min   Anion gap 11 5 - 15  CBC  Result Value Ref Range   WBC 14.5 (H) 4.0 - 10.5 K/uL   RBC 5.24 4.22 - 5.81 MIL/uL   Hemoglobin 15.5 13.0 -  17.0 g/dL   HCT 43.8 39.0 - 52.0 %   MCV 83.6 80.0 - 100.0 fL   MCH 29.6 26.0 - 34.0 pg   MCHC 35.4 30.0 - 36.0 g/dL   RDW 12.7 11.5 - 15.5 %   Platelets 296 150 - 400 K/uL   nRBC 0.0 0.0 - 0.2 %     EKG None  Radiology No results found.  Procedures Procedures   Medications Ordered in ED Medications  sodium chloride 0.9 % bolus 1,000 mL (has no administration in time range)  HYDROmorphone (DILAUDID) injection 1 mg (has no administration in time range)  ondansetron (ZOFRAN) injection 4 mg (has no administration in time range)    ED Course  I have reviewed the triage vital signs and the nursing notes.  Pertinent labs & imaging results that were available during my care of the patient were reviewed by me and considered in my medical decision making (see chart for details).    MDM Rules/Calculators/A&P                          Iv ns bolus. Stat labs.   Dilaudid 1 mg iv. Zofran iv.   Reviewed nursing notes and prior charts for additional history.   Labs reviewed/interpreted by me - k sl low. Lipase mildly high.   Recheck, abd soft non tender. No vomiting.   Pain improved but persists.  Additional dose pain med given.   Pepcid po, maalox po.   Kcl po.  Pt currently appears stable for d/c.   Rec pcp/gi f/u.   Return precautions provided.  Final Clinical Impression(s) / ED Diagnoses Final diagnoses:  None    Rx / DC Orders ED Discharge Orders     None        Lajean Saver, MD 12/25/20 1529

## 2020-12-25 NOTE — ED Triage Notes (Signed)
Patient reports hx of pancreatitis and states having same symptoms as previous

## 2020-12-26 ENCOUNTER — Other Ambulatory Visit: Payer: Self-pay | Admitting: Family Medicine

## 2020-12-26 DIAGNOSIS — G894 Chronic pain syndrome: Secondary | ICD-10-CM

## 2020-12-26 DIAGNOSIS — G8929 Other chronic pain: Secondary | ICD-10-CM

## 2020-12-30 ENCOUNTER — Encounter: Payer: Self-pay | Admitting: Family Medicine

## 2020-12-30 ENCOUNTER — Other Ambulatory Visit: Payer: Self-pay | Admitting: Family Medicine

## 2020-12-30 DIAGNOSIS — G894 Chronic pain syndrome: Secondary | ICD-10-CM

## 2020-12-30 DIAGNOSIS — G8929 Other chronic pain: Secondary | ICD-10-CM

## 2020-12-30 MED ORDER — OXYCODONE HCL 10 MG PO TABS: ORAL_TABLET | ORAL | 0 refills | Status: DC

## 2021-01-09 ENCOUNTER — Encounter: Payer: Self-pay | Admitting: Family Medicine

## 2021-01-09 ENCOUNTER — Other Ambulatory Visit: Payer: Self-pay

## 2021-01-09 ENCOUNTER — Encounter (HOSPITAL_BASED_OUTPATIENT_CLINIC_OR_DEPARTMENT_OTHER): Payer: Self-pay

## 2021-01-09 DIAGNOSIS — G894 Chronic pain syndrome: Secondary | ICD-10-CM

## 2021-01-09 DIAGNOSIS — K861 Other chronic pancreatitis: Secondary | ICD-10-CM | POA: Diagnosis not present

## 2021-01-09 DIAGNOSIS — F17210 Nicotine dependence, cigarettes, uncomplicated: Secondary | ICD-10-CM | POA: Diagnosis not present

## 2021-01-09 DIAGNOSIS — I10 Essential (primary) hypertension: Secondary | ICD-10-CM | POA: Insufficient documentation

## 2021-01-09 DIAGNOSIS — R1013 Epigastric pain: Secondary | ICD-10-CM | POA: Diagnosis not present

## 2021-01-09 LAB — COMPREHENSIVE METABOLIC PANEL
ALT: 29 U/L (ref 0–44)
AST: 23 U/L (ref 15–41)
Albumin: 4.1 g/dL (ref 3.5–5.0)
Alkaline Phosphatase: 73 U/L (ref 38–126)
Anion gap: 9 (ref 5–15)
BUN: 12 mg/dL (ref 6–20)
CO2: 23 mmol/L (ref 22–32)
Calcium: 9.6 mg/dL (ref 8.9–10.3)
Chloride: 107 mmol/L (ref 98–111)
Creatinine, Ser: 0.7 mg/dL (ref 0.61–1.24)
GFR, Estimated: >60 mL/min (ref >60)
Glucose, Bld: 87 mg/dL (ref 70–99)
Potassium: 3.8 mmol/L (ref 3.5–5.1)
Sodium: 139 mmol/L (ref 135–145)
Total Bilirubin: 0.6 mg/dL (ref 0.3–1.2)
Total Protein: 7.3 g/dL (ref 6.5–8.1)

## 2021-01-09 LAB — CBC
HCT: 44.3 % (ref 39.0–52.0)
Hemoglobin: 15.5 g/dL (ref 13.0–17.0)
MCH: 29.4 pg (ref 26.0–34.0)
MCHC: 35 g/dL (ref 30.0–36.0)
MCV: 83.9 fL (ref 80.0–100.0)
Platelets: 228 10*3/uL (ref 150–400)
RBC: 5.28 MIL/uL (ref 4.22–5.81)
RDW: 12.4 % (ref 11.5–15.5)
WBC: 11.8 10*3/uL — ABNORMAL HIGH (ref 4.0–10.5)
nRBC: 0 % (ref 0.0–0.2)

## 2021-01-09 LAB — URINALYSIS, ROUTINE W REFLEX MICROSCOPIC
Bilirubin Urine: NEGATIVE
Glucose, UA: NEGATIVE mg/dL
Hgb urine dipstick: NEGATIVE
Ketones, ur: NEGATIVE mg/dL
Leukocytes,Ua: NEGATIVE
Nitrite: NEGATIVE
Protein, ur: NEGATIVE mg/dL
Specific Gravity, Urine: 1.018 (ref 1.005–1.030)
pH: 5.5 (ref 5.0–8.0)

## 2021-01-09 LAB — LIPASE, BLOOD: Lipase: 21 U/L (ref 11–51)

## 2021-01-09 MED ORDER — XTAMPZA ER 9 MG PO C12A: 1.0000 | EXTENDED_RELEASE_CAPSULE | Freq: Two times a day (BID) | ORAL | 0 refills | Status: DC

## 2021-01-09 NOTE — Telephone Encounter (Signed)
Refilled [place] once in primary provider's absence.

## 2021-01-09 NOTE — ED Triage Notes (Addendum)
Reports hx of pancreatitis and started having a flare up around lunch time. Patient took home meds of zofran phenergan and pain meds but can't keep them down.  Zofran '8mg'$  was taken 2-3 hrs ago.

## 2021-01-10 ENCOUNTER — Emergency Department (HOSPITAL_BASED_OUTPATIENT_CLINIC_OR_DEPARTMENT_OTHER)
Admission: EM | Admit: 2021-01-10 | Discharge: 2021-01-10 | Disposition: A | Discharge status: Home / Self Care | Payer: BLUE CROSS/BLUE SHIELD | Attending: Emergency Medicine | Admitting: Emergency Medicine

## 2021-01-10 ENCOUNTER — Encounter: Payer: Self-pay | Admitting: Family Medicine

## 2021-01-10 DIAGNOSIS — K861 Other chronic pancreatitis: Secondary | ICD-10-CM

## 2021-01-10 MED ORDER — HYDROMORPHONE HCL 1 MG/ML IJ SOLN
1.0000 mg | Freq: Once | INTRAMUSCULAR | Status: AC
  Administered 2021-01-10: 1 mg via INTRAVENOUS
  Filled 2021-01-10: qty 1

## 2021-01-10 MED ORDER — ONDANSETRON HCL 4 MG/2ML IJ SOLN
4.0000 mg | Freq: Once | INTRAMUSCULAR | Status: AC
  Administered 2021-01-10: 4 mg via INTRAVENOUS
  Filled 2021-01-10: qty 2

## 2021-01-10 MED ORDER — HYDROMORPHONE HCL 1 MG/ML IJ SOLN
2.0000 mg | Freq: Once | INTRAMUSCULAR | Status: AC
  Administered 2021-01-10: 2 mg via INTRAVENOUS
  Filled 2021-01-10: qty 2

## 2021-01-10 NOTE — Discharge Instructions (Addendum)
You were evaluated in the Emergency Department and after careful evaluation, we did not find any emergent condition requiring admission or further testing in the hospital.  Your exam/testing today was overall reassuring.  Recommend continued follow-up with your GI doctors for further management.  Please return to the Emergency Department if you experience any worsening of your condition.  Thank you for allowing [place] to be a part of your care.

## 2021-01-10 NOTE — ED Provider Notes (Signed)
[facility] Emergency Department Provider Note MRN:  [id number]  Arrival date & time: 01/10/21     Chief Complaint   Abdominal Pain   History of Present Illness   [name] is a 45 y.o. year-old male with a history of chronic pancreatitis presenting to the ED with chief complaint of abdominal pain.  Location: Epigastric Duration: 1 or 2 days Onset: Gradual Timing: Constant Description: Dull ache Severity: Moderate to severe Exacerbating/Alleviating Factors: None Associated Symptoms: Nausea Pertinent Negatives: Denies fever, no lower abdominal pain, no chest pain, no shortness of breath   Review of Systems  A complete 10 system review of systems was obtained and all systems are negative except as noted in the HPI and PMH.   Patient's Health History    Past Medical History:  Diagnosis Date   Chronic abdominal pain    Diverticulitis    Drug-seeking behavior    Hypertension    Kidney stones    Pancreatitis, chronic ([place]) 05/28/2005    Past Surgical History:  Procedure Laterality Date   APPENDECTOMY     CHOLECYSTECTOMY     ERCP N/A 12/30/2019   Procedure: ENDOSCOPIC RETROGRADE CHOLANGIOPANCREATOGRAPHY (ERCP);  Surgeon: Milus Banister, MD;  Location: [facility];  Service: Endoscopy;  Laterality: N/A;   ERCP W/ METAL STENT PLACEMENT     KIDNEY STONE SURGERY     REMOVAL OF STONES  12/30/2019   Procedure: REMOVAL OF STONES;  Surgeon: Milus Banister, MD;  Location: [facility];  Service: Endoscopy;;    Family History  Problem Relation Age of Onset   Cancer Mother    Early death Mother    Hypertension Mother    Heart failure Father    Hypertension Father    Heart disease Father    Hyperlipidemia Father    Pancreatic cancer Paternal Grandmother        possibly   Pancreatic cancer Paternal Aunt    Colon cancer Neg Hx     Social History   Socioeconomic History   Marital status: Married    Spouse name: Not on file   Number of  children: Not on file   Years of education: Not on file   Highest education level: Not on file  Occupational History   Not on file  Tobacco Use   Smoking status: Every Day    Packs/day: 0.50    Types: Cigarettes   Smokeless tobacco: Never  Vaping Use   Vaping Use: Never used  Substance and Sexual Activity   Alcohol use: Not Currently    Comment: rare   Drug use: No   Sexual activity: Not on file  Other Topics Concern   Not on file  Social History Narrative   Not on file   Social Determinants of Health   Financial Resource Strain: Not on file  Food Insecurity: Not on file  Transportation Needs: Not on file  Physical Activity: Not on file  Stress: Not on file  Social Connections: Not on file  Intimate Partner Violence: Not on file     Physical Exam   Vitals:   01/10/21 0230 01/10/21 0330  BP: (!) 108/96 (!) 147/96  Pulse: 80 88  Resp: 18 17  Temp:    SpO2: 95% 96%    CONSTITUTIONAL: Well-appearing, NAD NEURO:  Alert and oriented x 3, no focal deficits EYES:  eyes equal and reactive ENT/NECK:  no LAD, no JVD CARDIO: Regular rate, well-perfused, normal S1 and S2 PULM:  CTAB no  wheezing or rhonchi GI/GU:  normal bowel sounds, non-distended, non-tender MSK/SPINE:  No gross deformities, no edema SKIN:  no rash, atraumatic PSYCH:  Appropriate speech and behavior  *Additional and/or pertinent findings included in MDM below  Diagnostic and Interventional Summary    EKG Interpretation  Date/Time:    Ventricular Rate:    PR Interval:    QRS Duration:   QT Interval:    QTC Calculation:   R Axis:     Text Interpretation:         Labs Reviewed  CBC - Abnormal; Notable for the following components:      Result Value   WBC 11.8 (*)    All other components within normal limits  LIPASE, BLOOD  COMPREHENSIVE METABOLIC PANEL  URINALYSIS, ROUTINE W REFLEX MICROSCOPIC    No orders to display    Medications  HYDROmorphone (DILAUDID) injection 1 mg (1 mg  Intravenous Given 01/10/21 0133)  ondansetron (ZOFRAN) injection 4 mg (4 mg Intravenous Given 01/10/21 0134)  HYDROmorphone (DILAUDID) injection 2 mg (2 mg Intravenous Given 01/10/21 0255)     Procedures  /  Critical Care Procedures  ED Course and Medical Decision Making  I have reviewed the triage vital signs, the nursing notes, and pertinent available records from the EMR.  Listed above are laboratory and imaging tests that I personally ordered, reviewed, and interpreted and then considered in my medical decision making (see below for details).  Suspect acute on chronic pancreatitis.  Patient explains it feels very similar to prior flareups.  Normal vital signs, some tenderness to the epigastrium on exam.  Discussed imaging options and CT offered but declined by patient.  Labs are reassuring, providing pain management and will reassess.     Patient is feeling better and requesting discharge, will follow-up with GI.  Barth Kirks. Sedonia Small, [place] [email or web address]  Final Clinical Impressions(s) / ED Diagnoses     ICD-10-CM   1. Chronic pancreatitis, unspecified pancreatitis type ([place])  K86.1       ED Discharge Orders     None        Discharge Instructions Discussed with and Provided to Patient:     Discharge Instructions      You were evaluated in the Emergency Department and after careful evaluation, we did not find any emergent condition requiring admission or further testing in the hospital.  Your exam/testing today was overall reassuring.  Recommend continued follow-up with your GI doctors for further management.  Please return to the Emergency Department if you experience any worsening of your condition.  Thank you for allowing [place] to be a part of your care.         Maudie Flakes, MD 01/10/21 [phone]

## 2021-01-11 ENCOUNTER — Emergency Department (HOSPITAL_BASED_OUTPATIENT_CLINIC_OR_DEPARTMENT_OTHER)
Admission: EM | Admit: 2021-01-11 | Discharge: 2021-01-11 | Disposition: A | Discharge status: Home / Self Care | Payer: BLUE CROSS/BLUE SHIELD | Attending: Emergency Medicine | Admitting: Emergency Medicine

## 2021-01-11 ENCOUNTER — Encounter (HOSPITAL_BASED_OUTPATIENT_CLINIC_OR_DEPARTMENT_OTHER): Payer: Self-pay

## 2021-01-11 ENCOUNTER — Other Ambulatory Visit: Payer: Self-pay

## 2021-01-11 DIAGNOSIS — F17210 Nicotine dependence, cigarettes, uncomplicated: Secondary | ICD-10-CM | POA: Insufficient documentation

## 2021-01-11 DIAGNOSIS — R112 Nausea with vomiting, unspecified: Secondary | ICD-10-CM | POA: Diagnosis not present

## 2021-01-11 DIAGNOSIS — R1012 Left upper quadrant pain: Secondary | ICD-10-CM | POA: Diagnosis not present

## 2021-01-11 DIAGNOSIS — I10 Essential (primary) hypertension: Secondary | ICD-10-CM | POA: Diagnosis not present

## 2021-01-11 DIAGNOSIS — G8929 Other chronic pain: Secondary | ICD-10-CM | POA: Diagnosis not present

## 2021-01-11 DIAGNOSIS — Z79899 Other long term (current) drug therapy: Secondary | ICD-10-CM | POA: Insufficient documentation

## 2021-01-11 DIAGNOSIS — R1013 Epigastric pain: Secondary | ICD-10-CM | POA: Diagnosis not present

## 2021-01-11 DIAGNOSIS — R109 Unspecified abdominal pain: Secondary | ICD-10-CM | POA: Diagnosis not present

## 2021-01-11 LAB — CBC WITH DIFFERENTIAL/PLATELET
Abs Immature Granulocytes: 0.08 10*3/uL — ABNORMAL HIGH (ref 0.00–0.07)
Basophils Absolute: 0.1 10*3/uL (ref 0.0–0.1)
Basophils Relative: 1 %
Eosinophils Absolute: 0.3 10*3/uL (ref 0.0–0.5)
Eosinophils Relative: 2 %
HCT: 41.8 % (ref 39.0–52.0)
Hemoglobin: 14.7 g/dL (ref 13.0–17.0)
Immature Granulocytes: 1 %
Lymphocytes Relative: 29 %
Lymphs Abs: 3.6 10*3/uL (ref 0.7–4.0)
MCH: 29.4 pg (ref 26.0–34.0)
MCHC: 35.2 g/dL (ref 30.0–36.0)
MCV: 83.6 fL (ref 80.0–100.0)
Monocytes Absolute: 1.1 10*3/uL — ABNORMAL HIGH (ref 0.1–1.0)
Monocytes Relative: 9 %
Neutro Abs: 7 10*3/uL (ref 1.7–7.7)
Neutrophils Relative %: 58 %
Platelets: 216 10*3/uL (ref 150–400)
RBC: 5 MIL/uL (ref 4.22–5.81)
RDW: 12.5 % (ref 11.5–15.5)
WBC: 12.1 10*3/uL — ABNORMAL HIGH (ref 4.0–10.5)
nRBC: 0 % (ref 0.0–0.2)

## 2021-01-11 LAB — COMPREHENSIVE METABOLIC PANEL
ALT: 29 U/L (ref 0–44)
AST: 22 U/L (ref 15–41)
Albumin: 3.9 g/dL (ref 3.5–5.0)
Alkaline Phosphatase: 79 U/L (ref 38–126)
Anion gap: 10 (ref 5–15)
BUN: 13 mg/dL (ref 6–20)
CO2: 23 mmol/L (ref 22–32)
Calcium: 9 mg/dL (ref 8.9–10.3)
Chloride: 106 mmol/L (ref 98–111)
Creatinine, Ser: 0.79 mg/dL (ref 0.61–1.24)
GFR, Estimated: >60 mL/min (ref >60)
Glucose, Bld: 91 mg/dL (ref 70–99)
Potassium: 3.4 mmol/L — ABNORMAL LOW (ref 3.5–5.1)
Sodium: 139 mmol/L (ref 135–145)
Total Bilirubin: 0.5 mg/dL (ref 0.3–1.2)
Total Protein: 6.7 g/dL (ref 6.5–8.1)

## 2021-01-11 LAB — LIPASE, BLOOD: Lipase: 21 U/L (ref 11–51)

## 2021-01-11 MED ORDER — ONDANSETRON HCL 4 MG/2ML IJ SOLN
4.0000 mg | Freq: Once | INTRAMUSCULAR | Status: AC
  Administered 2021-01-11: 4 mg via INTRAVENOUS
  Filled 2021-01-11: qty 2

## 2021-01-11 MED ORDER — SODIUM CHLORIDE 0.9 % IV BOLUS
1000.0000 mL | Freq: Once | INTRAVENOUS | Status: AC
  Administered 2021-01-11: 1000 mL via INTRAVENOUS

## 2021-01-11 MED ORDER — HYDROMORPHONE HCL 1 MG/ML IJ SOLN
1.0000 mg | Freq: Once | INTRAMUSCULAR | Status: AC
Start: 2021-01-11 — End: 2021-01-11
  Administered 2021-01-11: 1 mg via INTRAVENOUS
  Filled 2021-01-11: qty 1

## 2021-01-11 MED ORDER — PROMETHAZINE HCL 25 MG/ML IJ SOLN
INTRAMUSCULAR | Status: AC
  Filled 2021-01-11: qty 1

## 2021-01-11 MED ORDER — HYDROMORPHONE HCL 1 MG/ML IJ SOLN
1.0000 mg | Freq: Once | INTRAMUSCULAR | Status: AC
  Administered 2021-01-11: 1 mg via INTRAVENOUS
  Filled 2021-01-11: qty 1

## 2021-01-11 MED ORDER — SODIUM CHLORIDE 0.9 % IV SOLN
12.5000 mg | Freq: Once | INTRAVENOUS | Status: AC
  Administered 2021-01-11: 12.5 mg via INTRAVENOUS
  Filled 2021-01-11: qty 0.5

## 2021-01-11 NOTE — ED Provider Notes (Addendum)
[place] EMERGENCY DEPT Provider Note   CSN: [id number] Arrival date & time: 01/11/21  [id number]     History Chief Complaint  Patient presents with   Abdominal Pain    [name] is a 45 y.o. male.  Patient with chronic abdominal pain attributed to chronic pancreatitis here with flare of his usual pain.  Seen yesterday for same.  States [name] has been hurting constantly for the past 2 or 3 days and having multiple episodes of nausea and vomiting and dry heaving.  Vomited about 6 times yesterday that was nonbilious and nonbloody with some dry heaving.  Denies any fevers, chills, diarrhea, pain with urination or blood in the urine.  No chest pain or shortness of breath.  Pain is in the center of his abdomen and radiates to his back.  Feels the same as previous episodes of pancreatitis.  Denies any alcohol or drug use.  States [name] is taking his chronic pain medication at home having multiple episodes of nausea and vomiting and is not effective. Did feel better when [name] left here yesterday but started throwing up again.  The history is provided by the patient.  Abdominal Pain Associated symptoms: nausea and vomiting   Associated symptoms: no chest pain, no constipation, no cough, no diarrhea, no dysuria, no fever, no hematuria and no shortness of breath       Past Medical History:  Diagnosis Date   Chronic abdominal pain    Diverticulitis    Drug-seeking behavior    Hypertension    Kidney stones    Pancreatitis, chronic ([place]) 05/28/2005    Patient Active Problem List   Diagnosis Date Noted   Drug-seeking behavior 11/25/2020   Diverticulitis 11/25/2020   Kidney stones 11/25/2020   Hyperlipidemia, mixed 03/01/2020   Abnormal magnetic resonance cholangiopancreatography (MRCP)    Choledocholithiasis    E coli bacteremia    Bacteremia due to Enterobacter species    Bacteremia 12/27/2019   QT prolongation 12/27/2019   Nausea without vomiting    Chronic pain  disorder 08/29/2018   Back pain, chronic 08/29/2018   Nausea and vomiting in adult 02/25/2018   Hypertension, essential, benign 01/18/2018   Chronic biliary pancreatitis ([place]) 01/18/2018   Chronic abdominal pain 01/14/2018    Past Surgical History:  Procedure Laterality Date   APPENDECTOMY     CHOLECYSTECTOMY     ERCP N/A 12/30/2019   Procedure: ENDOSCOPIC RETROGRADE CHOLANGIOPANCREATOGRAPHY (ERCP);  Surgeon: Milus Banister, MD;  Location: [facility];  Service: Endoscopy;  Laterality: N/A;   ERCP W/ METAL STENT PLACEMENT     KIDNEY STONE SURGERY     REMOVAL OF STONES  12/30/2019   Procedure: REMOVAL OF STONES;  Surgeon: Milus Banister, MD;  Location: [facility];  Service: Endoscopy;;       Family History  Problem Relation Age of Onset   Cancer Mother    Early death Mother    Hypertension Mother    Heart failure Father    Hypertension Father    Heart disease Father    Hyperlipidemia Father    Pancreatic cancer Paternal Grandmother        possibly   Pancreatic cancer Paternal Aunt    Colon cancer Neg Hx     Social History   Tobacco Use   Smoking status: Every Day    Packs/day: 0.50    Types: Cigarettes   Smokeless tobacco: Never  Vaping Use   Vaping Use: Never used  Substance Use Topics  Alcohol use: Not Currently    Comment: rare   Drug use: No    Home Medications Prior to Admission medications   Medication Sig Start Date End Date Taking? Authorizing Provider  amLODipine-benazepril (LOTREL) 5-20 MG capsule TAKE 1 CAPSULE BY MOUTH EVERY DAY 11/12/20   Martinique, Betty G, MD  fluticasone [facility]) 50 MCG/ACT nasal spray Place 1 spray into both nostrils 2 (two) times daily. 04/03/19   [place], Betty G, MD  metoprolol succinate (TOPROL-XL) 100 MG 24 hr tablet TAKE 1 TABLET (100 MG TOTAL) BY MOUTH DAILY. TAKE WITH OR IMMEDIATELY FOLLOWING A MEAL. 10/25/20   Martin[place], Betty G, MD  naloxone [facility]) nasal spray 4 mg/0.1 mL 1 spray in each nostril x 1 if opioid overdose.  06/08/20   [place], Betty G, MD  nicotine (NICODERM CQ - DOSED IN MG/24 HOURS) 14 mg/24hr patch Place 1 patch (14 mg total) onto the skin daily. 12/31/19   Eugenie Filler, MD  ondansetron (ZOFRAN ODT) 8 MG disintegrating tablet Take 1 tablet (8 mg total) by mouth every 8 (eight) hours as needed for nausea or vomiting. 12/25/20   Lajean Saver, MD  oxyCODONE ER [facility]) 9 MG C12A Take 1 tablet by mouth 2 (two) times daily. 01/09/21   Burchette, Alinda Sierras, MD  Oxycodone HCl 10 MG TABS 1 tablet twice daily as needed, can take an extra tablet if needed.  No more than 70 tablets/month. 12/30/20   [place], Betty G, MD  pantoprazole (PROTONIX) 40 MG tablet Take 1 tablet (40 mg total) by mouth daily. 12/25/20   Lajean Saver, MD  promethazine (PHENERGAN) 25 MG suppository Place 1 suppository (25 mg total) rectally every 6 (six) hours as needed for nausea or vomiting. 06/29/20   Deno Etienne, DO  promethazine (PHENERGAN) 25 MG tablet TAKE 1 TABLET BY MOUTH EVERY 12 HOURS AS NEEDED 11/30/20   [place], Betty G, MD    Allergies    Cortisone, Eggs or egg-derived products, Ketorolac, Ketorolac tromethamine, Haldol [haloperidol lactate], Haloperidol, Hydrocortisone, Iodinated diagnostic agents, Ketorolac tromethamine, and Reglan [metoclopramide]  Review of Systems   Review of Systems  Constitutional:  Positive for activity change and appetite change. Negative for fever.  HENT:  Negative for congestion and rhinorrhea.   Respiratory:  Negative for cough, chest tightness and shortness of breath.   Cardiovascular:  Negative for chest pain and leg swelling.  Gastrointestinal:  Positive for abdominal pain, nausea and vomiting. Negative for constipation and diarrhea.  Genitourinary:  Negative for dysuria and hematuria.  Musculoskeletal:  Negative for arthralgias and myalgias.  Skin:  Negative for rash.  Neurological:  Negative for dizziness, weakness and headaches.   all other systems are negative except as noted in the HPI  and PMH.   Physical Exam Updated Vital Signs BP (!) 155/95 (BP Location: Right Arm)   Pulse 78   Temp 97.6 F (36.4 C) (Oral)   Resp 16   Ht '6\' 1"'$  (1.854 m)   Wt 93 kg   SpO2 100%   BMI 27.05 kg/m   Physical Exam Vitals and nursing note reviewed.  Constitutional:      General: [name] is not in acute distress.    Appearance: [name] is well-developed.  HENT:     Head: Normocephalic and atraumatic.     Mouth/Throat:     Mouth: Mucous membranes are dry.     Pharynx: No oropharyngeal exudate.  Eyes:     Conjunctiva/sclera: Conjunctivae normal.     Pupils: Pupils are equal,  round, and reactive to light.  Neck:     Comments: No meningismus. Cardiovascular:     Rate and Rhythm: Normal rate and regular rhythm.     Heart sounds: Normal heart sounds. No murmur heard. Pulmonary:     Effort: Pulmonary effort is normal. No respiratory distress.     Breath sounds: Normal breath sounds.  Abdominal:     Palpations: Abdomen is soft.     Tenderness: There is abdominal tenderness. There is no guarding or rebound.     Comments: TTP epigastrium, LUQ.  No guarding or rebound. No lower abdominal tenderness  Musculoskeletal:        General: No tenderness. Normal range of motion.     Cervical back: Normal range of motion and neck supple.  Skin:    General: Skin is warm.  Neurological:     Mental Status: [name] is alert and oriented to person, place, and time.     Cranial Nerves: No cranial nerve deficit.     Motor: No abnormal muscle tone.     Coordination: Coordination normal.     Comments:  5/5 strength throughout. CN 2-12 intact.Equal grip strength.   Psychiatric:        Behavior: Behavior normal.    ED Results / Procedures / Treatments   Labs (all labs ordered are listed, but only abnormal results are displayed) Labs Reviewed  CBC WITH DIFFERENTIAL/PLATELET  COMPREHENSIVE METABOLIC PANEL  LIPASE, BLOOD  URINALYSIS, ROUTINE W REFLEX MICROSCOPIC    EKG None  Radiology No results  found.  Procedures Procedures   Medications Ordered in ED Medications  sodium chloride 0.9 % bolus 1,000 mL (has no administration in time range)  HYDROmorphone (DILAUDID) injection 1 mg (has no administration in time range)  promethazine (PHENERGAN) 12.5 mg in sodium chloride 0.9 % 50 mL IVPB (has no administration in time range)    ED Course  I have reviewed the triage vital signs and the nursing notes.  Pertinent labs & imaging results that were available during my care of the patient were reviewed by me and considered in my medical decision making (see chart for details).    MDM Rules/Calculators/A&P                          Chronic abdominal pain with reported chronic pancreatitis.  Abdomen soft without peritoneal signs.  No fever.  Will hydrate, check labs and treat symptoms.  Labs leukocytosis which appears to be chronic.  LFTs and lipase are normal.  Patient states this feels exactly the same as previous episodes of chronic pancreatitis prefers to defer for CT imaging.   Hydration and symptom control pending at shift change. Anticipate discharge home when symptoms improved. Patient to followup with his specialists, return precautions discussed.    Final Clinical Impression(s) / ED Diagnoses Final diagnoses:  Chronic abdominal pain    Rx / DC Orders ED Discharge Orders     None        Chyenne Sobczak, Annie Main, MD 01/11/21 [id number]    Ezequiel Essex, MD 01/11/21 [id number]

## 2021-01-11 NOTE — Telephone Encounter (Signed)
Lovena Le from [facility] called asking about prior authorization for [employer] for [place].  Fax number for [employer] is [phone].  Patient told Lovena Le that he is going out of town this afternoon and will need his medication.  Please advise.

## 2021-01-11 NOTE — Discharge Instructions (Signed)
Take your pain and nausea medications as prescribed and follow-up with your doctor.  Return to the ED with new or worsening symptoms.

## 2021-01-11 NOTE — ED Triage Notes (Signed)
Patient here POV from Home with ABD Pain.   Patient has a Medical History of Pancreatitis and has had an execration of Pain for 2-3 days. Patient was seen for Same approximately 24 hours PTA. Moderate Nausea with 6-7 Episodes of Emesis per Patient.  Ambulatory. NAD Noted during Triage. A&Ox4. GCS 15. No Diarrhea. No CP, No SOB. No Fevers at Home.

## 2021-01-11 NOTE — ED Provider Notes (Signed)
Patient tolerating PO and ready to go home. He is awaiting insurance authorization for a refill of his Xtampza. Follow up with PCP for further pain control needs.    Truddie Hidden, MD 01/11/21 [phone]

## 2021-01-11 NOTE — Telephone Encounter (Signed)
PA completed [id number]

## 2021-01-11 NOTE — ED Notes (Signed)
He is calm in appearance. He tells he he feels "better". I get him some water and ice chips per his request. He has no other requests at this time.

## 2021-01-15 DIAGNOSIS — R1010 Upper abdominal pain, unspecified: Secondary | ICD-10-CM | POA: Diagnosis not present

## 2021-01-15 DIAGNOSIS — R1012 Left upper quadrant pain: Secondary | ICD-10-CM | POA: Diagnosis not present

## 2021-01-15 DIAGNOSIS — R112 Nausea with vomiting, unspecified: Secondary | ICD-10-CM | POA: Diagnosis not present

## 2021-01-15 DIAGNOSIS — F17200 Nicotine dependence, unspecified, uncomplicated: Secondary | ICD-10-CM | POA: Diagnosis not present

## 2021-01-15 DIAGNOSIS — I10 Essential (primary) hypertension: Secondary | ICD-10-CM | POA: Diagnosis not present

## 2021-01-16 ENCOUNTER — Other Ambulatory Visit: Payer: Self-pay | Admitting: Family Medicine

## 2021-01-16 DIAGNOSIS — K861 Other chronic pancreatitis: Secondary | ICD-10-CM

## 2021-01-16 DIAGNOSIS — R112 Nausea with vomiting, unspecified: Secondary | ICD-10-CM

## 2021-01-26 ENCOUNTER — Other Ambulatory Visit: Payer: Self-pay | Admitting: Family Medicine

## 2021-01-26 DIAGNOSIS — G894 Chronic pain syndrome: Secondary | ICD-10-CM

## 2021-01-26 DIAGNOSIS — G8929 Other chronic pain: Secondary | ICD-10-CM

## 2021-01-29 ENCOUNTER — Other Ambulatory Visit: Payer: Self-pay

## 2021-01-29 ENCOUNTER — Encounter (HOSPITAL_BASED_OUTPATIENT_CLINIC_OR_DEPARTMENT_OTHER): Payer: Self-pay

## 2021-01-29 ENCOUNTER — Emergency Department (HOSPITAL_BASED_OUTPATIENT_CLINIC_OR_DEPARTMENT_OTHER)
Admission: EM | Admit: 2021-01-29 | Discharge: 2021-01-29 | Disposition: A | Discharge status: Home / Self Care | Payer: BLUE CROSS/BLUE SHIELD | Attending: Emergency Medicine | Admitting: Emergency Medicine

## 2021-01-29 DIAGNOSIS — I10 Essential (primary) hypertension: Secondary | ICD-10-CM | POA: Insufficient documentation

## 2021-01-29 DIAGNOSIS — F17210 Nicotine dependence, cigarettes, uncomplicated: Secondary | ICD-10-CM | POA: Insufficient documentation

## 2021-01-29 DIAGNOSIS — K861 Other chronic pancreatitis: Secondary | ICD-10-CM

## 2021-01-29 DIAGNOSIS — Z79899 Other long term (current) drug therapy: Secondary | ICD-10-CM | POA: Diagnosis not present

## 2021-01-29 DIAGNOSIS — R1013 Epigastric pain: Secondary | ICD-10-CM | POA: Diagnosis not present

## 2021-01-29 LAB — CBC WITH DIFFERENTIAL/PLATELET
Abs Immature Granulocytes: 0.06 10*3/uL (ref 0.00–0.07)
Basophils Absolute: 0.1 10*3/uL (ref 0.0–0.1)
Basophils Relative: 1 %
Eosinophils Absolute: 0.4 10*3/uL (ref 0.0–0.5)
Eosinophils Relative: 4 %
HCT: 44.1 % (ref 39.0–52.0)
Hemoglobin: 15.5 g/dL (ref 13.0–17.0)
Immature Granulocytes: 1 %
Lymphocytes Relative: 34 %
Lymphs Abs: 3.9 10*3/uL (ref 0.7–4.0)
MCH: 29.6 pg (ref 26.0–34.0)
MCHC: 35.1 g/dL (ref 30.0–36.0)
MCV: 84.3 fL (ref 80.0–100.0)
Monocytes Absolute: 1 10*3/uL (ref 0.1–1.0)
Monocytes Relative: 9 %
Neutro Abs: 5.8 10*3/uL (ref 1.7–7.7)
Neutrophils Relative %: 51 %
Platelets: 225 10*3/uL (ref 150–400)
RBC: 5.23 MIL/uL (ref 4.22–5.81)
RDW: 12.9 % (ref 11.5–15.5)
WBC: 11.2 10*3/uL — ABNORMAL HIGH (ref 4.0–10.5)
nRBC: 0 % (ref 0.0–0.2)

## 2021-01-29 LAB — COMPREHENSIVE METABOLIC PANEL
ALT: 21 U/L (ref 0–44)
AST: 16 U/L (ref 15–41)
Albumin: 4 g/dL (ref 3.5–5.0)
Alkaline Phosphatase: 79 U/L (ref 38–126)
Anion gap: 9 (ref 5–15)
BUN: 10 mg/dL (ref 6–20)
CO2: 23 mmol/L (ref 22–32)
Calcium: 9.2 mg/dL (ref 8.9–10.3)
Chloride: 108 mmol/L (ref 98–111)
Creatinine, Ser: 0.79 mg/dL (ref 0.61–1.24)
GFR, Estimated: >60 mL/min (ref >60)
Glucose, Bld: 96 mg/dL (ref 70–99)
Potassium: 3.4 mmol/L — ABNORMAL LOW (ref 3.5–5.1)
Sodium: 140 mmol/L (ref 135–145)
Total Bilirubin: 0.4 mg/dL (ref 0.3–1.2)
Total Protein: 7.1 g/dL (ref 6.5–8.1)

## 2021-01-29 LAB — LIPASE, BLOOD: Lipase: 20 U/L (ref 11–51)

## 2021-01-29 MED ORDER — PROMETHAZINE HCL 25 MG/ML IJ SOLN
INTRAMUSCULAR | Status: AC
  Filled 2021-01-29: qty 1

## 2021-01-29 MED ORDER — OXYCODONE-ACETAMINOPHEN 5-325 MG PO TABS
2.0000 | ORAL_TABLET | Freq: Once | ORAL | Status: AC
  Administered 2021-01-29: 2 via ORAL
  Filled 2021-01-29: qty 2

## 2021-01-29 MED ORDER — ONDANSETRON HCL 4 MG/2ML IJ SOLN
4.0000 mg | Freq: Once | INTRAMUSCULAR | Status: AC
  Administered 2021-01-29: 4 mg via INTRAVENOUS
  Filled 2021-01-29: qty 2

## 2021-01-29 MED ORDER — HYDROMORPHONE HCL 1 MG/ML IJ SOLN
1.0000 mg | Freq: Once | INTRAMUSCULAR | Status: AC
  Administered 2021-01-29: 1 mg via INTRAVENOUS
  Filled 2021-01-29: qty 1

## 2021-01-29 MED ORDER — SODIUM CHLORIDE 0.9 % IV SOLN
12.5000 mg | Freq: Four times a day (QID) | INTRAVENOUS | Status: DC | PRN
Start: 2021-01-29 — End: 2021-01-29
  Administered 2021-01-29: 12.5 mg via INTRAVENOUS
  Filled 2021-01-29: qty 0.5

## 2021-01-29 NOTE — ED Provider Notes (Signed)
[place] EMERGENCY DEPT Provider Note   CSN: [id number] Arrival date & time: 01/29/21  0706     History Chief Complaint  Patient presents with   Abdominal Pain    [name] is a 45 y.o. male.  Patient is a 45 year old male who presents with a flareup of his pancreatitis.  He has a history of chronic pancreatitis.  He says he is having the same symptoms.  He has had pain over the last 3 days associated nausea and vomiting.  His emesis is nonbloody nonbilious.  No fevers.  No change in stools.  No urinary symptoms.  He takes oxycodone and Phenergan at home which she has taken without relief in symptoms.  He says he has an appointment with pain management and 1 month.      Past Medical History:  Diagnosis Date   Chronic abdominal pain    Diverticulitis    Drug-seeking behavior    Hypertension    Kidney stones    Pancreatitis, chronic ([place]) 05/28/2005    Patient Active Problem List   Diagnosis Date Noted   Drug-seeking behavior 11/25/2020   Diverticulitis 11/25/2020   Kidney stones 11/25/2020   Hyperlipidemia, mixed 03/01/2020   Abnormal magnetic resonance cholangiopancreatography (MRCP)    Choledocholithiasis    E coli bacteremia    Bacteremia due to Enterobacter species    Bacteremia 12/27/2019   QT prolongation 12/27/2019   Nausea without vomiting    Chronic pain disorder 08/29/2018   Back pain, chronic 08/29/2018   Nausea and vomiting in adult 02/25/2018   Hypertension, essential, benign 01/18/2018   Chronic biliary pancreatitis ([place]) 01/18/2018   Chronic abdominal pain 01/14/2018    Past Surgical History:  Procedure Laterality Date   APPENDECTOMY     CHOLECYSTECTOMY     ERCP N/A 12/30/2019   Procedure: ENDOSCOPIC RETROGRADE CHOLANGIOPANCREATOGRAPHY (ERCP);  Surgeon: Milus Banister, MD;  Location: [facility];  Service: Endoscopy;  Laterality: N/A;   ERCP W/ METAL STENT PLACEMENT     KIDNEY STONE SURGERY     REMOVAL OF STONES   12/30/2019   Procedure: REMOVAL OF STONES;  Surgeon: Milus Banister, MD;  Location: [facility];  Service: Endoscopy;;       Family History  Problem Relation Age of Onset   Cancer Mother    Early death Mother    Hypertension Mother    Heart failure Father    Hypertension Father    Heart disease Father    Hyperlipidemia Father    Pancreatic cancer Paternal Grandmother        possibly   Pancreatic cancer Paternal Aunt    Colon cancer Neg Hx     Social History   Tobacco Use   Smoking status: Every Day    Packs/day: 0.50    Types: Cigarettes   Smokeless tobacco: Never  Vaping Use   Vaping Use: Never used  Substance Use Topics   Alcohol use: Not Currently    Comment: rare   Drug use: No    Home Medications Prior to Admission medications   Medication Sig Start Date End Date Taking? Authorizing Provider  amLODipine-benazepril (LOTREL) 5-20 MG capsule TAKE 1 CAPSULE BY MOUTH EVERY DAY 11/12/20   [place], Betty G, MD  fluticasone [facility]) 50 MCG/ACT nasal spray Place 1 spray into both nostrils 2 (two) times daily. 04/03/19   [place], Betty G, MD  metoprolol succinate (TOPROL-XL) 100 MG 24 hr tablet TAKE 1 TABLET (100 MG TOTAL) BY MOUTH DAILY. TAKE  WITH OR IMMEDIATELY FOLLOWING A MEAL. 10/25/20   [place], Betty G, MD  naloxone [facility]) nasal spray 4 mg/0.1 mL 1 spray in each nostril x 1 if opioid overdose. 06/08/20   [place], Betty G, MD  nicotine (NICODERM CQ - DOSED IN MG/24 HOURS) 14 mg/24hr patch Place 1 patch (14 mg total) onto the skin daily. 12/31/19   Eugenie Filler, MD  ondansetron (ZOFRAN ODT) 8 MG disintegrating tablet Take 1 tablet (8 mg total) by mouth every 8 (eight) hours as needed for nausea or vomiting. 12/25/20   Lajean Saver, MD  oxyCODONE ER [facility]) 9 MG C12A Take 1 tablet by mouth 2 (two) times daily. 01/09/21   Burchette, Alinda Sierras, MD  Oxycodone HCl 10 MG TABS 1 tablet twice daily as needed, can take an extra tablet if needed.  No more than 70 tablets/month.  12/30/20   [place], Betty G, MD  pantoprazole (PROTONIX) 40 MG tablet Take 1 tablet (40 mg total) by mouth daily. 12/25/20   Lajean Saver, MD  promethazine (PHENERGAN) 25 MG suppository Place 1 suppository (25 mg total) rectally every 6 (six) hours as needed for nausea or vomiting. 06/29/20   Deno Etienne, DO  promethazine (PHENERGAN) 25 MG tablet TAKE 1 TABLET BY MOUTH EVERY 12 HOURS AS NEEDED 01/16/21   [place], Betty G, MD    Allergies    Cortisone, Eggs or egg-derived products, Ketorolac, Ketorolac tromethamine, Haldol [haloperidol lactate], Haloperidol, Hydrocortisone, Iodinated diagnostic agents, Ketorolac tromethamine, and Reglan [metoclopramide]  Review of Systems   Review of Systems  Constitutional:  Negative for chills, diaphoresis, fatigue and fever.  HENT:  Negative for congestion, rhinorrhea and sneezing.   Eyes: Negative.   Respiratory:  Negative for cough, chest tightness and shortness of breath.   Cardiovascular:  Negative for chest pain and leg swelling.  Gastrointestinal:  Positive for abdominal pain, nausea and vomiting. Negative for blood in stool and diarrhea.  Genitourinary:  Negative for difficulty urinating, flank pain, frequency and hematuria.  Musculoskeletal:  Negative for arthralgias and back pain.  Skin:  Negative for rash.  Neurological:  Negative for dizziness, speech difficulty, weakness, numbness and headaches.   Physical Exam Updated Vital Signs BP (!) 143/104   Pulse 65   Temp 98.6 F (37 C) (Oral)   Resp 15   Ht '6\' 1"'$  (1.854 m)   Wt 92.5 kg   SpO2 97%   BMI 26.91 kg/m   Physical Exam Constitutional:      Appearance: He is well-developed.  HENT:     Head: Normocephalic and atraumatic.  Eyes:     Pupils: Pupils are equal, round, and reactive to light.  Cardiovascular:     Rate and Rhythm: Normal rate and regular rhythm.     Heart sounds: Normal heart sounds.  Pulmonary:     Effort: Pulmonary effort is normal. No respiratory distress.     Breath  sounds: Normal breath sounds. No wheezing or rales.  Chest:     Chest wall: No tenderness.  Abdominal:     General: Bowel sounds are normal.     Palpations: Abdomen is soft.     Tenderness: There is abdominal tenderness in the epigastric area. There is no guarding or rebound.  Musculoskeletal:        General: Normal range of motion.     Cervical back: Normal range of motion and neck supple.  Lymphadenopathy:     Cervical: No cervical adenopathy.  Skin:    General: Skin is warm  and dry.     Findings: No rash.  Neurological:     Mental Status: He is alert and oriented to person, place, and time.    ED Results / Procedures / Treatments   Labs (all labs ordered are listed, but only abnormal results are displayed) Labs Reviewed  COMPREHENSIVE METABOLIC PANEL - Abnormal; Notable for the following components:      Result Value   Potassium 3.4 (*)    All other components within normal limits  CBC WITH DIFFERENTIAL/PLATELET - Abnormal; Notable for the following components:   WBC 11.2 (*)    All other components within normal limits  LIPASE, BLOOD    EKG EKG Interpretation  Date/Time:  Sunday January 29 2021 07:56:31 EDT Ventricular Rate:  73 PR Interval:  149 QRS Duration: 100 QT Interval:  403 QTC Calculation: 445 R Axis:   44 Text Interpretation: Sinus rhythm since last tracing no significant change Confirmed by Malvin Johns [phone]) on 01/29/2021 8:59:23 AM  Radiology No results found.  Procedures Procedures   Medications Ordered in ED Medications  promethazine (PHENERGAN) 12.5 mg in sodium chloride 0.9 % 50 mL IVPB (12.5 mg Intravenous New Bag/Given 01/29/21 0933)  promethazine (PHENERGAN) 25 MG/ML injection (  See Procedure Record 01/29/21 0931)  HYDROmorphone (DILAUDID) injection 1 mg (1 mg Intravenous Given 01/29/21 0753)  ondansetron (ZOFRAN) injection 4 mg (4 mg Intravenous Given 01/29/21 0806)  oxyCODONE-acetaminophen (PERCOCET/ROXICET) 5-325 MG per tablet 2 tablet  (2 tablets Oral Given 01/29/21 0957)    ED Course  I have reviewed the triage vital signs and the nursing notes.  Pertinent labs & imaging results that were available during my care of the patient were reviewed by me and considered in my medical decision making (see chart for details).    MDM Rules/Calculators/A&P                           Patient is a 45 year old male who presents with a flareup of his chronic abdominal pain.  He says his symptoms are similar to his prior flareups so I do not feel that imaging is indicated.  His labs are nonconcerning.  His symptoms have been controlled in the ED.  He is able to drink fluids.  He was encouraged to follow-up with his physicians.  Return precautions were given. Final Clinical Impression(s) / ED Diagnoses Final diagnoses:  Chronic pancreatitis, unspecified pancreatitis type [facility])    Rx / DC Orders ED Discharge Orders     None        Malvin Johns, MD 01/29/21 1215

## 2021-01-29 NOTE — ED Triage Notes (Signed)
He c/o luq abd. Discomfort, which he recognizes as a flare of his chronic pancreatitis. He states he has had this, plus some n/v since Thurs. (Two days). He is ambulatory and in no distress.

## 2021-01-30 ENCOUNTER — Other Ambulatory Visit: Payer: Self-pay | Admitting: Family Medicine

## 2021-01-30 DIAGNOSIS — R109 Unspecified abdominal pain: Secondary | ICD-10-CM

## 2021-01-30 DIAGNOSIS — G8929 Other chronic pain: Secondary | ICD-10-CM

## 2021-01-30 DIAGNOSIS — G894 Chronic pain syndrome: Secondary | ICD-10-CM

## 2021-01-31 ENCOUNTER — Encounter: Payer: Self-pay | Admitting: Family Medicine

## 2021-01-31 ENCOUNTER — Other Ambulatory Visit: Payer: Self-pay | Admitting: Family Medicine

## 2021-01-31 DIAGNOSIS — R109 Unspecified abdominal pain: Secondary | ICD-10-CM

## 2021-01-31 DIAGNOSIS — G8929 Other chronic pain: Secondary | ICD-10-CM

## 2021-01-31 MED ORDER — OXYCODONE HCL 10 MG PO TABS: ORAL_TABLET | ORAL | 0 refills | Status: DC

## 2021-02-10 ENCOUNTER — Other Ambulatory Visit: Payer: Self-pay | Admitting: Family Medicine

## 2021-02-10 ENCOUNTER — Ambulatory Visit: Payer: BLUE CROSS/BLUE SHIELD | Admitting: Family Medicine

## 2021-02-10 DIAGNOSIS — G894 Chronic pain syndrome: Secondary | ICD-10-CM

## 2021-02-12 DIAGNOSIS — I10 Essential (primary) hypertension: Secondary | ICD-10-CM | POA: Diagnosis not present

## 2021-02-12 DIAGNOSIS — R1010 Upper abdominal pain, unspecified: Secondary | ICD-10-CM | POA: Diagnosis not present

## 2021-02-12 DIAGNOSIS — R112 Nausea with vomiting, unspecified: Secondary | ICD-10-CM | POA: Diagnosis not present

## 2021-02-12 DIAGNOSIS — Z87442 Personal history of urinary calculi: Secondary | ICD-10-CM | POA: Diagnosis not present

## 2021-02-12 DIAGNOSIS — Z888 Allergy status to other drugs, medicaments and biological substances status: Secondary | ICD-10-CM | POA: Diagnosis not present

## 2021-02-12 DIAGNOSIS — Z9049 Acquired absence of other specified parts of digestive tract: Secondary | ICD-10-CM | POA: Diagnosis not present

## 2021-02-12 DIAGNOSIS — K861 Other chronic pancreatitis: Secondary | ICD-10-CM | POA: Diagnosis not present

## 2021-02-14 ENCOUNTER — Other Ambulatory Visit: Payer: Self-pay | Admitting: Family Medicine

## 2021-02-14 ENCOUNTER — Encounter: Payer: Self-pay | Admitting: Family Medicine

## 2021-02-14 DIAGNOSIS — G894 Chronic pain syndrome: Secondary | ICD-10-CM

## 2021-02-14 NOTE — Telephone Encounter (Signed)
Last filled 01/11/21, has upcoming appt on 02/24/21

## 2021-02-15 ENCOUNTER — Encounter: Payer: Self-pay | Admitting: Family Medicine

## 2021-02-15 MED ORDER — XTAMPZA ER 9 MG PO C12A: 1.0000 | EXTENDED_RELEASE_CAPSULE | Freq: Two times a day (BID) | ORAL | 0 refills | Status: DC

## 2021-02-17 ENCOUNTER — Encounter: Payer: Self-pay | Admitting: Family Medicine

## 2021-02-17 ENCOUNTER — Encounter (HOSPITAL_BASED_OUTPATIENT_CLINIC_OR_DEPARTMENT_OTHER): Payer: Self-pay

## 2021-02-17 ENCOUNTER — Other Ambulatory Visit: Payer: Self-pay

## 2021-02-17 ENCOUNTER — Emergency Department (HOSPITAL_BASED_OUTPATIENT_CLINIC_OR_DEPARTMENT_OTHER)
Admission: EM | Admit: 2021-02-17 | Discharge: 2021-02-18 | Disposition: A | Discharge status: Home / Self Care | Payer: BLUE CROSS/BLUE SHIELD | Attending: Emergency Medicine | Admitting: Emergency Medicine

## 2021-02-17 DIAGNOSIS — K861 Other chronic pancreatitis: Secondary | ICD-10-CM | POA: Diagnosis not present

## 2021-02-17 DIAGNOSIS — F17210 Nicotine dependence, cigarettes, uncomplicated: Secondary | ICD-10-CM | POA: Diagnosis not present

## 2021-02-17 DIAGNOSIS — R1013 Epigastric pain: Secondary | ICD-10-CM | POA: Diagnosis not present

## 2021-02-17 DIAGNOSIS — R1110 Vomiting, unspecified: Secondary | ICD-10-CM | POA: Insufficient documentation

## 2021-02-17 DIAGNOSIS — I10 Essential (primary) hypertension: Secondary | ICD-10-CM | POA: Insufficient documentation

## 2021-02-17 DIAGNOSIS — Z79899 Other long term (current) drug therapy: Secondary | ICD-10-CM | POA: Diagnosis not present

## 2021-02-17 LAB — CBC
HCT: 41.4 % (ref 39.0–52.0)
Hemoglobin: 15.2 g/dL (ref 13.0–17.0)
MCH: 30.3 pg (ref 26.0–34.0)
MCHC: 36.7 g/dL — ABNORMAL HIGH (ref 30.0–36.0)
MCV: 82.6 fL (ref 80.0–100.0)
Platelets: 285 10*3/uL (ref 150–400)
RBC: 5.01 MIL/uL (ref 4.22–5.81)
RDW: 12.7 % (ref 11.5–15.5)
WBC: 13.2 10*3/uL — ABNORMAL HIGH (ref 4.0–10.5)
nRBC: 0 % (ref 0.0–0.2)

## 2021-02-17 NOTE — ED Triage Notes (Signed)
Hx of pancreatitis and complaining of abd x 2 days but worse over the last 6 hrs.

## 2021-02-17 NOTE — Telephone Encounter (Signed)
Patient declined coming today for the drug screen, stating that he can't come until his appointment next Friday. PCP notified.

## 2021-02-18 LAB — URINALYSIS, ROUTINE W REFLEX MICROSCOPIC
Bilirubin Urine: NEGATIVE
Glucose, UA: NEGATIVE mg/dL
Hgb urine dipstick: NEGATIVE
Ketones, ur: NEGATIVE mg/dL
Leukocytes,Ua: NEGATIVE
Nitrite: NEGATIVE
Specific Gravity, Urine: 1.019 (ref 1.005–1.030)
pH: 7 (ref 5.0–8.0)

## 2021-02-18 LAB — COMPREHENSIVE METABOLIC PANEL
ALT: 26 U/L (ref 0–44)
AST: 20 U/L (ref 15–41)
Albumin: 4.5 g/dL (ref 3.5–5.0)
Alkaline Phosphatase: 71 U/L (ref 38–126)
Anion gap: 10 (ref 5–15)
BUN: 18 mg/dL (ref 6–20)
CO2: 23 mmol/L (ref 22–32)
Calcium: 10.1 mg/dL (ref 8.9–10.3)
Chloride: 106 mmol/L (ref 98–111)
Creatinine, Ser: 1.24 mg/dL (ref 0.61–1.24)
GFR, Estimated: >60 mL/min (ref >60)
Glucose, Bld: 104 mg/dL — ABNORMAL HIGH (ref 70–99)
Potassium: 3.7 mmol/L (ref 3.5–5.1)
Sodium: 139 mmol/L (ref 135–145)
Total Bilirubin: 0.5 mg/dL (ref 0.3–1.2)
Total Protein: 7.1 g/dL (ref 6.5–8.1)

## 2021-02-18 LAB — LIPASE, BLOOD: Lipase: 37 U/L (ref 11–51)

## 2021-02-18 MED ORDER — ONDANSETRON HCL 4 MG/2ML IJ SOLN
4.0000 mg | Freq: Once | INTRAMUSCULAR | Status: AC
  Administered 2021-02-18: 4 mg via INTRAVENOUS
  Filled 2021-02-18: qty 2

## 2021-02-18 MED ORDER — SODIUM CHLORIDE 0.9 % IV BOLUS
1000.0000 mL | Freq: Once | INTRAVENOUS | Status: AC
  Administered 2021-02-18: 1000 mL via INTRAVENOUS

## 2021-02-18 MED ORDER — HYDROMORPHONE HCL 1 MG/ML IJ SOLN
1.0000 mg | Freq: Once | INTRAMUSCULAR | Status: AC
  Administered 2021-02-18: 1 mg via INTRAVENOUS
  Filled 2021-02-18: qty 1

## 2021-02-18 NOTE — Discharge Instructions (Signed)
Follow-up with your gastroenterologist and primary doctor to discuss pain management options.

## 2021-02-18 NOTE — ED Provider Notes (Signed)
[facility] EMERGENCY DEPT Provider Note   CSN: [id number] Arrival date & time: 02/17/21  2303     History Chief Complaint  Patient presents with   Abdominal Pain    [name] is a 45 y.o. male.  Patient is a 45 year old male with history of chronic pancreatitis.  He presents today with complaints of epigastric pain and vomiting.  He has felt nauseated for the past 2 days, then began vomiting today.  His pain has worsened throughout the day.  Patient denies any fevers or chills.  He denies any diarrhea or bloody stools.  Patient is followed by Dr. Henrene Pastor from gastroenterology.  He tells me he has not seen him in quite some time, but patient frequents the ER with recurrent abdominal pain/chronic pancreatitis.  The history is provided by the patient.  Abdominal Pain Pain location:  Epigastric Pain quality: cramping   Pain radiates to:  Back Pain severity:  Severe Onset quality:  Sudden Duration:  2 days Timing:  Constant Progression:  Worsening Chronicity:  Recurrent Relieved by:  Nothing     Past Medical History:  Diagnosis Date   Chronic abdominal pain    Diverticulitis    Drug-seeking behavior    Hypertension    Kidney stones    Pancreatitis, chronic ([place]) 05/28/2005    Patient Active Problem List   Diagnosis Date Noted   Drug-seeking behavior 11/25/2020   Diverticulitis 11/25/2020   Kidney stones 11/25/2020   Hyperlipidemia, mixed 03/01/2020   Abnormal magnetic resonance cholangiopancreatography (MRCP)    Choledocholithiasis    E coli bacteremia    Bacteremia due to Enterobacter species    Bacteremia 12/27/2019   QT prolongation 12/27/2019   Nausea without vomiting    Chronic pain disorder 08/29/2018   Back pain, chronic 08/29/2018   Nausea and vomiting in adult 02/25/2018   Hypertension, essential, benign 01/18/2018   Chronic biliary pancreatitis ([place]) 01/18/2018   Chronic abdominal pain 01/14/2018    Past Surgical History:   Procedure Laterality Date   APPENDECTOMY     CHOLECYSTECTOMY     ERCP N/A 12/30/2019   Procedure: ENDOSCOPIC RETROGRADE CHOLANGIOPANCREATOGRAPHY (ERCP);  Surgeon: Milus Banister, MD;  Location: [facility];  Service: Endoscopy;  Laterality: N/A;   ERCP W/ METAL STENT PLACEMENT     KIDNEY STONE SURGERY     REMOVAL OF STONES  12/30/2019   Procedure: REMOVAL OF STONES;  Surgeon: Milus Banister, MD;  Location: [facility];  Service: Endoscopy;;       Family History  Problem Relation Age of Onset   Cancer Mother    Early death Mother    Hypertension Mother    Heart failure Father    Hypertension Father    Heart disease Father    Hyperlipidemia Father    Pancreatic cancer Paternal Grandmother        possibly   Pancreatic cancer Paternal Aunt    Colon cancer Neg Hx     Social History   Tobacco Use   Smoking status: Every Day    Packs/day: 0.50    Types: Cigarettes   Smokeless tobacco: Never  Vaping Use   Vaping Use: Never used  Substance Use Topics   Alcohol use: Not Currently    Comment: rare   Drug use: No    Home Medications Prior to Admission medications   Medication Sig Start Date End Date Taking? Authorizing Provider  amLODipine-benazepril (LOTREL) 5-20 MG capsule TAKE 1 CAPSULE BY MOUTH EVERY DAY 11/12/20  Martinique, Betty G, MD  fluticasone [facility]) 50 MCG/ACT nasal spray Place 1 spray into both nostrils 2 (two) times daily. 04/03/19   Martin[place], Betty G, MD  metoprolol succinate (TOPROL-XL) 100 MG 24 hr tablet TAKE 1 TABLET (100 MG TOTAL) BY MOUTH DAILY. TAKE WITH OR IMMEDIATELY FOLLOWING A MEAL. 10/25/20   Martinique, Betty G, MD  naloxone [facility]) nasal spray 4 mg/0.1 mL 1 spray in each nostril x 1 if opioid overdose. 06/08/20   Martinique, Betty G, MD  nicotine (NICODERM CQ - DOSED IN MG/24 HOURS) 14 mg/24hr patch Place 1 patch (14 mg total) onto the skin daily. 12/31/19   Eugenie Filler, MD  ondansetron (ZOFRAN ODT) 8 MG disintegrating tablet Take 1 tablet (8 mg total)  by mouth every 8 (eight) hours as needed for nausea or vomiting. 12/25/20   Lajean Saver, MD  oxyCODONE ER [facility]) 9 MG C12A Take 1 tablet by mouth 2 (two) times daily. 02/15/21   Martin[place], Betty G, MD  Oxycodone HCl 10 MG TABS 1 tablet twice daily as needed, can take an extra tablet if needed.  No more than 70 tablets/month. 01/31/21   Martin[place], Betty G, MD  pantoprazole (PROTONIX) 40 MG tablet Take 1 tablet (40 mg total) by mouth daily. 12/25/20   Lajean Saver, MD  promethazine (PHENERGAN) 25 MG suppository Place 1 suppository (25 mg total) rectally every 6 (six) hours as needed for nausea or vomiting. 06/29/20   Deno Etienne, DO  promethazine (PHENERGAN) 25 MG tablet TAKE 1 TABLET BY MOUTH EVERY 12 HOURS AS NEEDED 01/16/21   Martinique, Betty G, MD    Allergies    Cortisone, Eggs or egg-derived products, Ketorolac, Ketorolac tromethamine, Haldol [haloperidol lactate], Haloperidol, Hydrocortisone, Iodinated diagnostic agents, Ketorolac tromethamine, and Reglan [metoclopramide]  Review of Systems   Review of Systems  Gastrointestinal:  Positive for abdominal pain.  All other systems reviewed and are negative.  Physical Exam Updated Vital Signs BP (!) 164/119 (BP Location: Right Arm)   Pulse (!) 116   Temp 98.8 F (37.1 C) (Oral)   Resp 20   Ht 6\' 1"  (1.854 m)   Wt 95.3 kg   SpO2 97%   BMI 27.71 kg/m   Physical Exam Vitals and nursing note reviewed.  Constitutional:      General: He is not in acute distress.    Appearance: He is well-developed. He is not diaphoretic.  HENT:     Head: Normocephalic and atraumatic.  Cardiovascular:     Rate and Rhythm: Normal rate and regular rhythm.     Heart sounds: No murmur heard.   No friction rub.  Pulmonary:     Effort: Pulmonary effort is normal. No respiratory distress.     Breath sounds: Normal breath sounds. No wheezing or rales.  Abdominal:     General: Bowel sounds are normal. There is no distension.     Palpations: Abdomen is soft.      Tenderness: There is abdominal tenderness in the epigastric area. There is no right CVA tenderness, left CVA tenderness, guarding or rebound.  Musculoskeletal:        General: Normal range of motion.     Cervical back: Normal range of motion and neck supple.  Skin:    General: Skin is warm and dry.  Neurological:     Mental Status: He is alert and oriented to person, place, and time.     Coordination: Coordination normal.    ED Results / Procedures / Treatments  Labs (all labs ordered are listed, but only abnormal results are displayed) Labs Reviewed  CBC - Abnormal; Notable for the following components:      Result Value   WBC 13.2 (*)    MCHC 36.7 (*)    All other components within normal limits  LIPASE, BLOOD  COMPREHENSIVE METABOLIC PANEL  URINALYSIS, ROUTINE W REFLEX MICROSCOPIC    EKG None  Radiology No results found.  Procedures Procedures   Medications Ordered in ED Medications  sodium chloride 0.9 % bolus 1,000 mL (has no administration in time range)  ondansetron (ZOFRAN) injection 4 mg (has no administration in time range)  HYDROmorphone (DILAUDID) injection 1 mg (has no administration in time range)    ED Course  I have reviewed the triage vital signs and the nursing notes.  Pertinent labs & imaging results that were available during my care of the patient were reviewed by me and considered in my medical decision making (see chart for details).    MDM Rules/Calculators/A&P  Patient with history of chronic abdominal pain related to chronic pancreatitis.  He presents with epigastric pain, nausea, and vomiting since earlier today.  Patient's laboratory studies are unremarkable.  He has a slight leukocytosis, but LFTs and lipase are normal.  His pain was treated and he was hydrated here in the ER and seems to be feeling better.  I have seen this patient many times in the past with this same presentation, and he frequents multiple ERs with this same  complaint.  Patient advised he needs to follow-up with his primary doctor and gastroenterologist regarding his chronic pain and chronic pain management.  And to also discuss how to prevent ongoing ER visits.  Final Clinical Impression(s) / ED Diagnoses Final diagnoses:  None    Rx / DC Orders ED Discharge Orders     None        Veryl Speak, MD 02/18/21 [phone]

## 2021-02-24 ENCOUNTER — Encounter: Payer: Self-pay | Admitting: Family Medicine

## 2021-02-24 ENCOUNTER — Telehealth (INDEPENDENT_AMBULATORY_CARE_PROVIDER_SITE_OTHER): Payer: BLUE CROSS/BLUE SHIELD | Admitting: Family Medicine

## 2021-02-24 VITALS — Ht 73.0 in

## 2021-02-24 DIAGNOSIS — G894 Chronic pain syndrome: Secondary | ICD-10-CM

## 2021-02-24 DIAGNOSIS — E782 Mixed hyperlipidemia: Secondary | ICD-10-CM

## 2021-02-24 DIAGNOSIS — R112 Nausea with vomiting, unspecified: Secondary | ICD-10-CM

## 2021-02-24 DIAGNOSIS — I10 Essential (primary) hypertension: Secondary | ICD-10-CM | POA: Diagnosis not present

## 2021-02-24 MED ORDER — OXYCODONE HCL 10 MG PO TABS
10.0000 mg | ORAL_TABLET | Freq: Three times a day (TID) | ORAL | 0 refills | Status: DC | PRN
Start: 2021-02-24 — End: 2021-03-06

## 2021-02-24 NOTE — Assessment & Plan Note (Signed)
Reporting "normal" BP's after pain is better controlled, usually at the time of ED discharge. For now continue Amlodipine-Benazepril 5-20 mg and Metoprolol succinate 100 mg daily. Continue monitoring BP regularly. I asked him to let me know about BP readings when he is ready to get another refill of Amlodipine-Benazepril. Continue low salt diet.

## 2021-02-24 NOTE — Assessment & Plan Note (Signed)
Pain is not adequately controlled. Still having ER visits due to pain and vomiting.The latter one can be aggravated by chronic opioid treatment. He has an appt with pain management on 03/17/21. His insurance if not covering Xtampza, he has not have it for the past 5 days. He will continue Oxycodone 10 mg tid prn and we will start weaning medication off. He may benefit from Buprenorphine, I will let this decision to pain management provider.

## 2021-02-24 NOTE — Assessment & Plan Note (Signed)
Continue non pharmacologic treatment for now. He is not going to be in town for the next 3 weeks, future lab placed, he will call to arrange appt when he is back in town.

## 2021-02-24 NOTE — Assessment & Plan Note (Signed)
?   Gastroparesis. He has a referral to see GI , Dr Henrene Pastor, placed on 01/31/21; so I do not think he needs a new one at this time. Instructed to call GI's office and arrange appt. Continue Phenergan and zofran same dose.

## 2021-02-24 NOTE — Progress Notes (Signed)
Virtual Visit via Video Note I connected with [name] on 02/24/21 by a video enabled telemedicine application and verified that I am speaking with the correct person using two identifiers.  Location patient: home Location provider:Home office Persons participating in the virtual visit: patient, provider  I discussed the limitations of evaluation and management by telemedicine and the availability of in person appointments. The patient expressed understanding and agreed to proceed.  Chief Complaint  Patient presents with   Hospitalization Follow-up   HPI: [name] is a 45 yo male with hx of HTN,HLD,and chronic pain bing seen for follow up. He was last seen on 11/09/20. Chronic pain, abdominal pain: He is on Oxycodone 10 mg tid prn with max 70 tabs/month and Xtampza 9 mg bid. He has been in the ER for pain,nausea,and vomiting on 9/4 and 02/17/21.   His health insurance has changed and not longer covering Xtampza, has not taken it for 4-5 days. Since Monday he has been on Oxycodone 10 mg tid.  He is on Phenergan 25 mg and Zofran 8 mg tid prn for nausea and vomiting. He is trying to see Dr Henrene Pastor, requesting referral. New referral was placed on 01/31/21.  GI symptoms are not always exacerbated by food intake.  Lab Results  Component Value Date   ALT 26 02/17/2021   AST 20 02/17/2021   ALKPHOS 71 02/17/2021   BILITOT 0.5 02/17/2021   Lab Results  Component Value Date   LIPASE 37 02/17/2021   Hypertension:  Medications:Metoprolol succinate 100 mg daily and Amlodipine-Benazepril 5-20 mg daily. BP readings at home:150-180/80-90's, depending of level of pain. Side effects:None During ER visits his BP has been elevated but he is reporting to be back to "normal" after IV pain medication and pain is better controlled.  Negative for unusual or severe headache, visual changes, exertional chest pain, dyspnea,  focal weakness, or edema.  Lab Results  Component Value Date    CREATININE 1.24 02/17/2021   BUN 18 02/17/2021   NA 139 02/17/2021   K 3.7 02/17/2021   CL 106 02/17/2021   CO2 23 02/17/2021   HLD: He is on non pharmacologic treatment.  Lab Results  Component Value Date   CHOL 207 (H) 06/03/2015   HDL 40 06/03/2015   LDLCALC 113 (H) 06/03/2015   TRIG 270 (H) 06/03/2015   CHOLHDL 5.2 (H) 06/03/2015   ROS: See pertinent positives and negatives per HPI.  Past Medical History:  Diagnosis Date   Chronic abdominal pain    Diverticulitis    Drug-seeking behavior    Hypertension    Kidney stones    Pancreatitis, chronic ([place]) 05/28/2005    Past Surgical History:  Procedure Laterality Date   APPENDECTOMY     CHOLECYSTECTOMY     ERCP N/A 12/30/2019   Procedure: ENDOSCOPIC RETROGRADE CHOLANGIOPANCREATOGRAPHY (ERCP);  Surgeon: Milus Banister, MD;  Location: [facility];  Service: Endoscopy;  Laterality: N/A;   ERCP W/ METAL STENT PLACEMENT     KIDNEY STONE SURGERY     REMOVAL OF STONES  12/30/2019   Procedure: REMOVAL OF STONES;  Surgeon: Milus Banister, MD;  Location: [facility];  Service: Endoscopy;;    Family History  Problem Relation Age of Onset   Cancer Mother    Early death Mother    Hypertension Mother    Heart failure Father    Hypertension Father    Heart disease Father    Hyperlipidemia Father    Pancreatic cancer Paternal Grandmother  possibly   Pancreatic cancer Paternal Aunt    Colon cancer Neg Hx     Social History   Socioeconomic History   Marital status: Married    Spouse name: Not on file   Number of children: Not on file   Years of education: Not on file   Highest education level: Not on file  Occupational History   Not on file  Tobacco Use   Smoking status: Every Day    Packs/day: 0.50    Types: Cigarettes   Smokeless tobacco: Never  Vaping Use   Vaping Use: Never used  Substance and Sexual Activity   Alcohol use: Not Currently    Comment: rare   Drug use: No   Sexual activity: Not on  file  Other Topics Concern   Not on file  Social History Narrative   Not on file   Social Determinants of Health   Financial Resource Strain: Not on file  Food Insecurity: Not on file  Transportation Needs: Not on file  Physical Activity: Not on file  Stress: Not on file  Social Connections: Not on file  Intimate Partner Violence: Not on file   Current Outpatient Medications:    amLODipine-benazepril (LOTREL) 5-20 MG capsule, TAKE 1 CAPSULE BY MOUTH EVERY DAY, Disp: 90 capsule, Rfl: 2   fluticasone (FLONASE) 50 MCG/ACT nasal spray, Place 1 spray into both nostrils 2 (two) times daily., Disp: 16 g, Rfl: 2   metoprolol succinate (TOPROL-XL) 100 MG 24 hr tablet, TAKE 1 TABLET (100 MG TOTAL) BY MOUTH DAILY. TAKE WITH OR IMMEDIATELY FOLLOWING A MEAL., Disp: 90 tablet, Rfl: 1   naloxone (NARCAN) nasal spray 4 mg/0.1 mL, 1 spray in each nostril x 1 if opioid overdose., Disp: 1 each, Rfl: 1   ondansetron (ZOFRAN ODT) 8 MG disintegrating tablet, Take 1 tablet (8 mg total) by mouth every 8 (eight) hours as needed for nausea or vomiting., Disp: 6 tablet, Rfl: 0   oxyCODONE ER (XTAMPZA ER) 9 MG C12A, Take 1 tablet by mouth 2 (two) times daily., Disp: 8 capsule, Rfl: 0   Oxycodone HCl 10 MG TABS, 1 tablet twice daily as needed, can take an extra tablet if needed.  No more than 70 tablets/month., Disp: 70 tablet, Rfl: 0   pantoprazole (PROTONIX) 40 MG tablet, Take 1 tablet (40 mg total) by mouth daily., Disp: 30 tablet, Rfl: 0   promethazine (PHENERGAN) 25 MG tablet, TAKE 1 TABLET BY MOUTH EVERY 12 HOURS AS NEEDED, Disp: 45 tablet, Rfl: 1  EXAM:  VITALS per patient if applicable:Ht 6\' 1"  (1.854 m)   BMI 27.71 kg/m   GENERAL: alert, oriented, appears well and in no acute distress  HEENT: atraumatic, conjunctiva clear, no obvious abnormalities on inspection.  NECK: normal movements of the head and neck  LUNGS: on inspection no signs of respiratory distress, breathing rate appears normal, no  obvious gross SOB, gasping or wheezing  CV: no obvious cyanosis  MS: moves all visible extremities without noticeable abnormality  PSYCH/NEURO: pleasant and cooperative, no obvious depression or anxiety, speech and thought processing grossly intact  ASSESSMENT AND PLAN:  Discussed the following assessment and plan:  Nausea and vomiting in adult ? Gastroparesis. He has a referral to see GI , Dr Henrene Pastor, placed on 01/31/21; so I do not think he needs a new one at this time. Instructed to call GI's office and arrange appt. Continue Phenergan and zofran same dose.   Hypertension, essential, benign Reporting "normal" BP's after pain is  better controlled, usually at the time of ED discharge. For now continue Amlodipine-Benazepril 5-20 mg and Metoprolol succinate 100 mg daily. Continue monitoring BP regularly. I asked him to let me know about BP readings when he is ready to get another refill of Amlodipine-Benazepril. Continue low salt diet.  Hyperlipidemia, mixed Continue non pharmacologic treatment for now. He is not going to be in town for the next 3 weeks, future lab placed, he will call to arrange appt when he is back in town.  Chronic pain disorder Pain is not adequately controlled. Still having ER visits due to pain and vomiting.The latter one can be aggravated by chronic opioid treatment. He has an appt with pain management on 03/17/21. His insurance if not covering Xtampza, he has not have it for the past 5 days. He will continue Oxycodone 10 mg tid prn and we will start weaning medication off. He may benefit from Buprenorphine, I will let this decision to pain management provider.  I discussed the assessment and treatment plan with the patient. The patient was provided an opportunity to ask questions and all were answered. The patient agreed with the plan and demonstrated an understanding of the instructions.  Return in about 4 months (around 06/26/2021).  Esai Stecklein [place], MD

## 2021-02-27 NOTE — Telephone Encounter (Signed)
Medication has been filled & picked up.

## 2021-03-03 ENCOUNTER — Emergency Department (HOSPITAL_BASED_OUTPATIENT_CLINIC_OR_DEPARTMENT_OTHER): Payer: BLUE CROSS/BLUE SHIELD

## 2021-03-03 ENCOUNTER — Encounter (HOSPITAL_BASED_OUTPATIENT_CLINIC_OR_DEPARTMENT_OTHER): Payer: Self-pay | Admitting: Emergency Medicine

## 2021-03-03 ENCOUNTER — Other Ambulatory Visit: Payer: Self-pay

## 2021-03-03 ENCOUNTER — Inpatient Hospital Stay (HOSPITAL_BASED_OUTPATIENT_CLINIC_OR_DEPARTMENT_OTHER)
Admission: EM | Admit: 2021-03-03 | Discharge: 2021-03-06 | DRG: 872 | Disposition: A | Discharge status: Home / Self Care | Payer: BLUE CROSS/BLUE SHIELD | Attending: Internal Medicine | Admitting: Internal Medicine

## 2021-03-03 DIAGNOSIS — Z87442 Personal history of urinary calculi: Secondary | ICD-10-CM

## 2021-03-03 DIAGNOSIS — I10 Essential (primary) hypertension: Secondary | ICD-10-CM | POA: Diagnosis not present

## 2021-03-03 DIAGNOSIS — G8929 Other chronic pain: Secondary | ICD-10-CM | POA: Diagnosis not present

## 2021-03-03 DIAGNOSIS — K8309 Other cholangitis: Secondary | ICD-10-CM

## 2021-03-03 DIAGNOSIS — K8061 Calculus of gallbladder and bile duct with cholecystitis, unspecified, with obstruction: Secondary | ICD-10-CM

## 2021-03-03 DIAGNOSIS — R748 Abnormal levels of other serum enzymes: Secondary | ICD-10-CM | POA: Diagnosis not present

## 2021-03-03 DIAGNOSIS — Z91012 Allergy to eggs: Secondary | ICD-10-CM | POA: Diagnosis not present

## 2021-03-03 DIAGNOSIS — K861 Other chronic pancreatitis: Secondary | ICD-10-CM | POA: Diagnosis not present

## 2021-03-03 DIAGNOSIS — N200 Calculus of kidney: Secondary | ICD-10-CM | POA: Diagnosis not present

## 2021-03-03 DIAGNOSIS — D696 Thrombocytopenia, unspecified: Secondary | ICD-10-CM | POA: Diagnosis not present

## 2021-03-03 DIAGNOSIS — Z888 Allergy status to other drugs, medicaments and biological substances status: Secondary | ICD-10-CM | POA: Diagnosis not present

## 2021-03-03 DIAGNOSIS — A419 Sepsis, unspecified organism: Secondary | ICD-10-CM | POA: Diagnosis not present

## 2021-03-03 DIAGNOSIS — Z8249 Family history of ischemic heart disease and other diseases of the circulatory system: Secondary | ICD-10-CM

## 2021-03-03 DIAGNOSIS — Z91041 Radiographic dye allergy status: Secondary | ICD-10-CM

## 2021-03-03 DIAGNOSIS — K8030 Calculus of bile duct with cholangitis, unspecified, without obstruction: Secondary | ICD-10-CM | POA: Diagnosis not present

## 2021-03-03 DIAGNOSIS — E782 Mixed hyperlipidemia: Secondary | ICD-10-CM | POA: Diagnosis not present

## 2021-03-03 DIAGNOSIS — K8050 Calculus of bile duct without cholangitis or cholecystitis without obstruction: Secondary | ICD-10-CM | POA: Diagnosis not present

## 2021-03-03 DIAGNOSIS — R1012 Left upper quadrant pain: Secondary | ICD-10-CM | POA: Diagnosis not present

## 2021-03-03 DIAGNOSIS — R109 Unspecified abdominal pain: Secondary | ICD-10-CM | POA: Diagnosis not present

## 2021-03-03 DIAGNOSIS — R1111 Vomiting without nausea: Secondary | ICD-10-CM | POA: Diagnosis not present

## 2021-03-03 DIAGNOSIS — K760 Fatty (change of) liver, not elsewhere classified: Secondary | ICD-10-CM | POA: Diagnosis not present

## 2021-03-03 DIAGNOSIS — Z79899 Other long term (current) drug therapy: Secondary | ICD-10-CM | POA: Diagnosis not present

## 2021-03-03 DIAGNOSIS — F17210 Nicotine dependence, cigarettes, uncomplicated: Secondary | ICD-10-CM | POA: Diagnosis present

## 2021-03-03 DIAGNOSIS — Z765 Malingerer [conscious simulation]: Secondary | ICD-10-CM

## 2021-03-03 DIAGNOSIS — Z20822 Contact with and (suspected) exposure to covid-19: Secondary | ICD-10-CM | POA: Diagnosis not present

## 2021-03-03 DIAGNOSIS — K8032 Calculus of bile duct with acute cholangitis without obstruction: Secondary | ICD-10-CM | POA: Diagnosis not present

## 2021-03-03 DIAGNOSIS — R1010 Upper abdominal pain, unspecified: Secondary | ICD-10-CM | POA: Diagnosis not present

## 2021-03-03 DIAGNOSIS — R7989 Other specified abnormal findings of blood chemistry: Secondary | ICD-10-CM | POA: Diagnosis not present

## 2021-03-03 DIAGNOSIS — R932 Abnormal findings on diagnostic imaging of liver and biliary tract: Secondary | ICD-10-CM | POA: Diagnosis not present

## 2021-03-03 LAB — CBC WITH DIFFERENTIAL/PLATELET
Abs Immature Granulocytes: 0.05 10*3/uL (ref 0.00–0.07)
Basophils Absolute: 0.1 10*3/uL (ref 0.0–0.1)
Basophils Relative: 0 %
Eosinophils Absolute: 0.3 10*3/uL (ref 0.0–0.5)
Eosinophils Relative: 2 %
HCT: 44.1 % (ref 39.0–52.0)
Hemoglobin: 15.8 g/dL (ref 13.0–17.0)
Immature Granulocytes: 0 %
Lymphocytes Relative: 9 %
Lymphs Abs: 1.2 10*3/uL (ref 0.7–4.0)
MCH: 30 pg (ref 26.0–34.0)
MCHC: 35.8 g/dL (ref 30.0–36.0)
MCV: 83.8 fL (ref 80.0–100.0)
Monocytes Absolute: 1.8 10*3/uL — ABNORMAL HIGH (ref 0.1–1.0)
Monocytes Relative: 14 %
Neutro Abs: 9.8 10*3/uL — ABNORMAL HIGH (ref 1.7–7.7)
Neutrophils Relative %: 75 %
Platelets: 216 10*3/uL (ref 150–400)
RBC: 5.26 MIL/uL (ref 4.22–5.81)
RDW: 13 % (ref 11.5–15.5)
WBC: 13.2 10*3/uL — ABNORMAL HIGH (ref 4.0–10.5)
nRBC: 0 % (ref 0.0–0.2)

## 2021-03-03 LAB — COMPREHENSIVE METABOLIC PANEL
ALT: 505 U/L — ABNORMAL HIGH (ref 0–44)
AST: 512 U/L — ABNORMAL HIGH (ref 15–41)
Albumin: 4.3 g/dL (ref 3.5–5.0)
Alkaline Phosphatase: 173 U/L — ABNORMAL HIGH (ref 38–126)
Anion gap: 9 (ref 5–15)
BUN: 15 mg/dL (ref 6–20)
CO2: 21 mmol/L — ABNORMAL LOW (ref 22–32)
Calcium: 9.5 mg/dL (ref 8.9–10.3)
Chloride: 107 mmol/L (ref 98–111)
Creatinine, Ser: 0.85 mg/dL (ref 0.61–1.24)
GFR, Estimated: >60 mL/min (ref >60)
Glucose, Bld: 122 mg/dL — ABNORMAL HIGH (ref 70–99)
Potassium: 3.5 mmol/L (ref 3.5–5.1)
Sodium: 137 mmol/L (ref 135–145)
Total Bilirubin: 4.3 mg/dL — ABNORMAL HIGH (ref 0.3–1.2)
Total Protein: 7.8 g/dL (ref 6.5–8.1)

## 2021-03-03 LAB — LIPASE, BLOOD: Lipase: 53 U/L — ABNORMAL HIGH (ref 11–51)

## 2021-03-03 LAB — RESP PANEL BY RT-PCR (FLU A&B, COVID) ARPGX2
Influenza A by PCR: NEGATIVE
Influenza B by PCR: NEGATIVE
SARS Coronavirus 2 by RT PCR: NEGATIVE

## 2021-03-03 LAB — LACTIC ACID, PLASMA: Lactic Acid, Venous: 1.3 mmol/L (ref 0.5–1.9)

## 2021-03-03 MED ORDER — PIPERACILLIN-TAZOBACTAM 3.375 G IVPB
3.3750 g | Freq: Once | INTRAVENOUS | Status: AC
  Administered 2021-03-03: 3.375 g via INTRAVENOUS
  Filled 2021-03-03: qty 50

## 2021-03-03 MED ORDER — HYDROMORPHONE HCL 1 MG/ML IJ SOLN
1.0000 mg | INTRAMUSCULAR | Status: AC | PRN
  Administered 2021-03-03 (×2): 1 mg via INTRAVENOUS
  Filled 2021-03-03 (×2): qty 1

## 2021-03-03 MED ORDER — ONDANSETRON HCL 4 MG/2ML IJ SOLN
4.0000 mg | Freq: Four times a day (QID) | INTRAMUSCULAR | Status: DC | PRN
  Administered 2021-03-03 – 2021-03-06 (×5): 4 mg via INTRAVENOUS
  Filled 2021-03-03 (×4): qty 2

## 2021-03-03 MED ORDER — PIPERACILLIN-TAZOBACTAM 3.375 G IVPB
3.3750 g | Freq: Three times a day (TID) | INTRAVENOUS | Status: DC
  Administered 2021-03-03 – 2021-03-06 (×8): 3.375 g via INTRAVENOUS
  Filled 2021-03-03 (×11): qty 50

## 2021-03-03 MED ORDER — SODIUM CHLORIDE 0.9 % IV SOLN
25.0000 mg | Freq: Four times a day (QID) | INTRAVENOUS | Status: DC | PRN
  Administered 2021-03-03: 25 mg via INTRAVENOUS
  Filled 2021-03-03: qty 1

## 2021-03-03 MED ORDER — SIMETHICONE 40 MG/0.6ML PO SUSP
80.0000 mg | Freq: Once | ORAL | Status: AC
  Administered 2021-03-03: 80 mg via ORAL
  Filled 2021-03-03: qty 1.2

## 2021-03-03 MED ORDER — PROCHLORPERAZINE EDISYLATE 10 MG/2ML IJ SOLN
5.0000 mg | INTRAMUSCULAR | Status: DC | PRN
  Filled 2021-03-03: qty 2

## 2021-03-03 MED ORDER — ONDANSETRON HCL 4 MG/2ML IJ SOLN: 4.0000 mg | Freq: Once | INTRAMUSCULAR | Status: DC

## 2021-03-03 MED ORDER — POTASSIUM CHLORIDE 10 MEQ/100ML IV SOLN
10.0000 meq | INTRAVENOUS | Status: AC
  Administered 2021-03-03 (×2): 10 meq via INTRAVENOUS
  Filled 2021-03-03 (×2): qty 100

## 2021-03-03 MED ORDER — METOPROLOL SUCCINATE ER 50 MG PO TB24
100.0000 mg | ORAL_TABLET | Freq: Every day | ORAL | Status: DC
  Administered 2021-03-04 – 2021-03-06 (×3): 100 mg via ORAL
  Filled 2021-03-03 (×3): qty 2

## 2021-03-03 MED ORDER — LACTATED RINGERS IV BOLUS
1000.0000 mL | Freq: Once | INTRAVENOUS | Status: AC
  Administered 2021-03-03: 1000 mL via INTRAVENOUS

## 2021-03-03 MED ORDER — OXYCODONE HCL 5 MG PO TABS
5.0000 mg | ORAL_TABLET | ORAL | Status: DC | PRN
  Administered 2021-03-03 – 2021-03-05 (×6): 5 mg via ORAL
  Filled 2021-03-03 (×6): qty 1

## 2021-03-03 MED ORDER — AMLODIPINE BESYLATE 5 MG PO TABS
5.0000 mg | ORAL_TABLET | Freq: Every day | ORAL | Status: DC
  Administered 2021-03-03 – 2021-03-06 (×3): 5 mg via ORAL
  Filled 2021-03-03 (×3): qty 1

## 2021-03-03 MED ORDER — HYDROMORPHONE HCL 1 MG/ML IJ SOLN
1.0000 mg | INTRAMUSCULAR | Status: DC | PRN
Start: 2021-03-03 — End: 2021-03-07
  Administered 2021-03-03 – 2021-03-06 (×11): 1 mg via INTRAVENOUS
  Filled 2021-03-03 (×11): qty 1

## 2021-03-03 MED ORDER — HYDRALAZINE HCL 20 MG/ML IJ SOLN: 10.0000 mg | Freq: Four times a day (QID) | INTRAMUSCULAR | Status: DC | PRN

## 2021-03-03 MED ORDER — ONDANSETRON HCL 4 MG/2ML IJ SOLN
4.0000 mg | Freq: Once | INTRAMUSCULAR | Status: AC
  Administered 2021-03-03: 4 mg via INTRAVENOUS
  Filled 2021-03-03: qty 2

## 2021-03-03 MED ORDER — MAGNESIUM SULFATE 2 GM/50ML IV SOLN
2.0000 g | Freq: Once | INTRAVENOUS | Status: AC
  Administered 2021-03-03: 2 g via INTRAVENOUS
  Filled 2021-03-03: qty 50

## 2021-03-03 MED ORDER — OXYCODONE HCL 5 MG PO TABS
10.0000 mg | ORAL_TABLET | Freq: Once | ORAL | Status: AC
Start: 2021-03-03 — End: 2021-03-03
  Administered 2021-03-03: 10 mg via ORAL
  Filled 2021-03-03: qty 2

## 2021-03-03 MED ORDER — IBUPROFEN 800 MG PO TABS
800.0000 mg | ORAL_TABLET | Freq: Once | ORAL | Status: AC
  Administered 2021-03-03: 800 mg via ORAL
  Filled 2021-03-03: qty 1

## 2021-03-03 MED ORDER — ENOXAPARIN SODIUM 40 MG/0.4ML IJ SOSY
40.0000 mg | PREFILLED_SYRINGE | INTRAMUSCULAR | Status: DC
  Administered 2021-03-03 – 2021-03-05 (×3): 40 mg via SUBCUTANEOUS
  Filled 2021-03-03 (×3): qty 0.4

## 2021-03-03 MED ORDER — POTASSIUM CHLORIDE IN NACL 40-0.9 MEQ/L-% IV SOLN: INTRAVENOUS | Status: DC

## 2021-03-03 MED ORDER — SODIUM CHLORIDE 0.9 % IV SOLN: INTRAVENOUS | Status: DC

## 2021-03-03 MED ORDER — SODIUM CHLORIDE 0.9 % IV SOLN: INTRAVENOUS | Status: DC | PRN

## 2021-03-03 MED ORDER — POTASSIUM CHLORIDE IN NACL 20-0.9 MEQ/L-% IV SOLN
INTRAVENOUS | Status: DC
  Filled 2021-03-03 (×2): qty 1000

## 2021-03-03 NOTE — ED Triage Notes (Signed)
Pt c/o abd pain with vomiting. Consistent with hx of pancreatitis.

## 2021-03-03 NOTE — Progress Notes (Signed)
   03/03/21 2126  Assess: MEWS Score  Temp (!) 102.5 F (39.2 C)  BP (!) 147/89 (Told nurse McKayla Moore 03/03/21)  Pulse Rate (!) 121 (Told nurse Mercy Riding 03/03/21)  Resp 20  Level of Consciousness Alert  SpO2 95 %  O2 Device Room Air  Assess: MEWS Score  MEWS Temp 2  MEWS Systolic 0  MEWS Pulse 2  MEWS RR 0  MEWS LOC 0  MEWS Score 4  MEWS Score Color Red  Assess: if the MEWS score is Yellow or Red  Were vital signs taken at a resting state? Yes  Focused Assessment No change from prior assessment  Does the patient meet 2 or more of the SIRS criteria? Yes  Does the patient have a confirmed or suspected source of infection? Yes  Provider and Rapid Response Notified? Yes  MEWS guidelines implemented *See Row Information* Yes  Treat  MEWS Interventions Administered prn meds/treatments  Pain Scale 0-10  Pain Score 7  Pain Type Acute pain  Pain Location Abdomen  Pain Orientation Anterior;Upper;Mid  Pain Radiating Towards back  Pain Descriptors / Indicators Aching;Constant;Sharp  Pain Frequency Constant  Pain Onset On-going  Patients Stated Pain Goal 0  Pain Intervention(s) Medication (See eMAR)  Multiple Pain Sites No  Take Vital Signs  Increase Vital Sign Frequency  Red: Q 1hr X 4 then Q 4hr X 4, if remains red, continue Q 4hrs  Escalate  MEWS: Escalate Red: discuss with charge nurse/RN and provider, consider discussing with RRT  Notify: Charge Nurse/RN  Name of Charge Nurse/RN Notified Driss Z  RN  Date Charge Nurse/RN Notified 03/03/21  Time Charge Nurse/RN Notified 2149  Notify: Provider  Provider Name/Title Bl[place] NP  Date Provider Notified 03/03/21  Time Provider Notified 2145  Notification Type Page  Notification Reason Change in status  Provider response See new orders  Date of Provider Response 03/03/21  Time of Provider Response 2211  Notify: Rapid Response  Name of Rapid Response RN Notified Erin RN  Time Rapid Response Notified 2145  Assess:  SIRS CRITERIA  SIRS Temperature  1  SIRS Pulse 1  SIRS Respirations  0  SIRS WBC 0  SIRS Score Sum  2  RED MEWS initiated,cooling measures initiated,icepacks to ampits & behind knees, PRN pain meds Jacques Navy NP informed & RR nurse notified

## 2021-03-03 NOTE — ED Provider Notes (Signed)
[facility] Provider Note: Georgena Spurling, MD, FACEP  CSN: [id number] MRN: [id number] ARRIVAL: 03/03/21 at [place]: [place]  Abdominal Pain   HISTORY OF PRESENT ILLNESS  03/03/21 3:15 AM [name] is a 45 y.o. male with a history of chronic idiopathic pancreatitis and frequent visits to the emergency department for same.  He is on chronic pain management, receiving Xtampza ER 9 mg, 60 tablets monthly as well as oxycodone 10 mg, 70 tablets monthly (he is being switched from his Xtampza ER due to insurance reasons).  He is here with nausea and vomiting that began yesterday morning about 7:30 AM.  He has vomited about 12 times.  Because of his vomiting he has not been able to take his oral analgesics and is having significant pain.  The pain is in his epigastrium and left upper quadrant radiating into his back pain at pain is consistent with previous episodes of pancreatitis.  His rates his pain as a 7 out of 10, somewhat worse with palpation or movement.  It is improved when lying on his right side.   Past Medical History:  Diagnosis Date   Chronic abdominal pain    Diverticulitis    Drug-seeking behavior    Hypertension    Kidney stones    Pancreatitis, chronic ([place]) 05/28/2005    Past Surgical History:  Procedure Laterality Date   APPENDECTOMY     CHOLECYSTECTOMY     ERCP N/A 12/30/2019   Procedure: ENDOSCOPIC RETROGRADE CHOLANGIOPANCREATOGRAPHY (ERCP);  Surgeon: Milus Banister, MD;  Location: [facility];  Service: Endoscopy;  Laterality: N/A;   ERCP W/ METAL STENT PLACEMENT     KIDNEY STONE SURGERY     REMOVAL OF STONES  12/30/2019   Procedure: REMOVAL OF STONES;  Surgeon: Milus Banister, MD;  Location: [facility];  Service: Endoscopy;;    Family History  Problem Relation Age of Onset   Cancer Mother    Early death Mother    Hypertension Mother    Heart failure Father    Hypertension Father    Heart disease Father     Hyperlipidemia Father    Pancreatic cancer Paternal Grandmother        possibly   Pancreatic cancer Paternal Aunt    Colon cancer Neg Hx     Social History   Tobacco Use   Smoking status: Every Day    Packs/day: 0.50    Types: Cigarettes   Smokeless tobacco: Never  Vaping Use   Vaping Use: Never used  Substance Use Topics   Alcohol use: Not Currently    Comment: rare   Drug use: No    Prior to Admission medications   Medication Sig Start Date End Date Taking? Authorizing Provider  amLODipine-benazepril (LOTREL) 5-20 MG capsule TAKE 1 CAPSULE BY MOUTH EVERY DAY Patient taking differently: Take 1 capsule by mouth daily. 11/12/20  Yes Martinique, Betty G, MD  fluticasone [facility]) 50 MCG/ACT nasal spray Place 1 spray into both nostrils 2 (two) times daily. Patient taking differently: Place 1 spray into both nostrils daily as needed for allergies. 04/03/19  Yes [place], Betty G, MD  metoprolol succinate (TOPROL-XL) 100 MG 24 hr tablet TAKE 1 TABLET (100 MG TOTAL) BY MOUTH DAILY. TAKE WITH OR IMMEDIATELY FOLLOWING A MEAL. 10/25/20  Yes Martin[place], Betty G, MD  naloxone [facility]) nasal spray 4 mg/0.1 mL 1 spray in each nostril x 1 if opioid overdose. Patient taking differently: Place 1 spray into  the nose once as needed (over dose). 1 spray in each nostril x 1 if opioid overdose. 06/08/20  Yes [place], Betty G, MD  ondansetron (ZOFRAN ODT) 8 MG disintegrating tablet Take 1 tablet (8 mg total) by mouth every 8 (eight) hours as needed for nausea or vomiting. 12/25/20  Yes Lajean Saver, MD  Oxycodone HCl 10 MG TABS Take 1 tablet (10 mg total) by mouth every 8 (eight) hours as needed (for moderate - severe pain). 02/24/21  Yes [place], Betty G, MD  pantoprazole (PROTONIX) 40 MG tablet Take 1 tablet (40 mg total) by mouth daily. Patient taking differently: Take 40 mg by mouth daily as needed (heart burn). 12/25/20  Yes Lajean Saver, MD  promethazine (PHENERGAN) 25 MG tablet TAKE 1 TABLET BY MOUTH EVERY 12  HOURS AS NEEDED Patient taking differently: Take 25 mg by mouth every 12 (twelve) hours as needed for nausea or vomiting. 01/16/21  Yes [place], Betty G, MD    Allergies Cortisone, Eggs or egg-derived products, Ketorolac, Ketorolac tromethamine, Haldol [haloperidol lactate], Haloperidol, Hydrocortisone, Iodinated diagnostic agents, Ketorolac tromethamine, Reglan [metoclopramide], and Compazine [prochlorperazine]   REVIEW OF SYSTEMS  Negative except as noted here or in the History of Present Illness.   PHYSICAL EXAMINATION  Initial Vital Signs Blood pressure (!) 150/96, pulse 98, temperature 99.8 F (37.7 C), temperature source Oral, resp. rate 20, height 6\' 1"  (1.854 m), weight 93 kg, SpO2 94 %.  Examination General: Well-developed, well-nourished male in no acute distress, lying on right side; appearance consistent with age of record HENT: normocephalic; atraumatic Eyes: Normal appearance Neck: supple Heart: regular rate and rhythm Lungs: clear to auscultation bilaterally Abdomen: soft; nondistended; epigastric and left upper quadrant tenderness; bowel sounds present Extremities: No deformity; full range of motion; pulses normal Neurologic: Awake, alert and oriented; motor function intact in all extremities and symmetric; no facial droop Skin: Warm and dry Psychiatric: Normal mood and affect   RESULTS  Summary of this visit's results, reviewed and interpreted by myself:   EKG Interpretation  Date/Time:  Friday March 03 2021 08:07:25 EDT Ventricular Rate:  109 PR Interval:  145 QRS Duration: 89 QT Interval:  364 QTC Calculation: 491 R Axis:   82 Text Interpretation: Sinus tachycardia Borderline T abnormalities, diffuse leads Borderline prolonged QT interval Since last tracing QT has lengthened slightly Confirmed by Calvert Cantor [phone]) on 03/03/2021 8:27:48 AM       Laboratory Studies: Results for orders placed or performed during the hospital encounter of 03/03/21  (from the past 24 hour(s))  CBC with Differential/Platelet     Status: Abnormal   Collection Time: 03/03/21  3:45 AM  Result Value Ref Range   WBC 13.2 (H) 4.0 - 10.5 K/uL   RBC 5.26 4.22 - 5.81 MIL/uL   Hemoglobin 15.8 13.0 - 17.0 g/dL   HCT 44.1 39.0 - 52.0 %   MCV 83.8 80.0 - 100.0 fL   MCH 30.0 26.0 - 34.0 pg   MCHC 35.8 30.0 - 36.0 g/dL   RDW 13.0 11.5 - 15.5 %   Platelets 216 150 - 400 K/uL   nRBC 0.0 0.0 - 0.2 %   Neutrophils Relative % 75 %   Neutro Abs 9.8 (H) 1.7 - 7.7 K/uL   Lymphocytes Relative 9 %   Lymphs Abs 1.2 0.7 - 4.0 K/uL   Monocytes Relative 14 %   Monocytes Absolute 1.8 (H) 0.1 - 1.0 K/uL   Eosinophils Relative 2 %   Eosinophils Absolute 0.3 0.0 - 0.5  K/uL   Basophils Relative 0 %   Basophils Absolute 0.1 0.0 - 0.1 K/uL   Immature Granulocytes 0 %   Abs Immature Granulocytes 0.05 0.00 - 0.07 K/uL  Comprehensive metabolic panel     Status: Abnormal   Collection Time: 03/03/21  3:45 AM  Result Value Ref Range   Sodium 137 135 - 145 mmol/L   Potassium 3.5 3.5 - 5.1 mmol/L   Chloride 107 98 - 111 mmol/L   CO2 21 (L) 22 - 32 mmol/L   Glucose, Bld 122 (H) 70 - 99 mg/dL   BUN 15 6 - 20 mg/dL   Creatinine, Ser 0.85 0.61 - 1.24 mg/dL   Calcium 9.5 8.9 - 10.3 mg/dL   Total Protein 7.8 6.5 - 8.1 g/dL   Albumin 4.3 3.5 - 5.0 g/dL   AST 512 (H) 15 - 41 U/L   ALT 505 (H) 0 - 44 U/L   Alkaline Phosphatase 173 (H) 38 - 126 U/L   Total Bilirubin 4.3 (H) 0.3 - 1.2 mg/dL   GFR, Estimated >60 >60 mL/min   Anion gap 9 5 - 15  Lipase, blood     Status: Abnormal   Collection Time: 03/03/21  3:45 AM  Result Value Ref Range   Lipase 53 (H) 11 - 51 U/L  Hepatitis panel, acute     Status: None (Preliminary result)   Collection Time: 03/03/21  5:07 AM  Result Value Ref Range   Hepatitis B Surface Ag NON REACTIVE NON REACTIVE   HCV Ab PENDING NON REACTIVE   Hep A IgM PENDING NON REACTIVE   Hep B C IgM PENDING NON REACTIVE  Resp Panel by RT-PCR (Flu A&B, Covid)  Nasopharyngeal Swab     Status: None   Collection Time: 03/03/21  8:37 AM   Specimen: Nasopharyngeal Swab; Nasopharyngeal(NP) swabs in vial transport medium  Result Value Ref Range   SARS Coronavirus 2 by RT PCR NEGATIVE NEGATIVE   Influenza A by PCR NEGATIVE NEGATIVE   Influenza B by PCR NEGATIVE NEGATIVE  Lactic acid, plasma     Status: None   Collection Time: 03/03/21  8:45 AM  Result Value Ref Range   Lactic Acid, Venous 1.3 0.5 - 1.9 mmol/L   Imaging Studies: CT ABDOMEN PELVIS WO CONTRAST  Result Date: 03/03/2021 CLINICAL DATA:  45 year old male with history of elevated liver function tests. Abdominal pain and vomiting. EXAM: CT ABDOMEN AND PELVIS WITHOUT CONTRAST TECHNIQUE: Multidetector CT imaging of the abdomen and pelvis was performed following the standard protocol without IV contrast. COMPARISON:  CT the abdomen and pelvis 12/25/2019. FINDINGS: Lower chest: Unremarkable. Hepatobiliary: No suspicious cystic or solid hepatic lesions. No definite suspicious cystic or solid hepatic lesions are confidently identified on today's noncontrast CT examination. Mild diffuse low attenuation throughout the hepatic parenchyma, indicative of a background of hepatic steatosis. Status post cholecystectomy. Mild intra and extrahepatic biliary ductal dilatation, similar to the prior study, likely reflective of benign post cholecystectomy physiology. Small amount of pneumobilia, similar to the prior study, presumably indicative of prior sphincterotomy. Pancreas: No definite pancreatic mass or peripancreatic fluid collections or inflammatory changes are noted on today's noncontrast CT examination. Spleen: Unremarkable. Adrenals/Urinary Tract: No calcifications are identified within the collecting system of either kidney, along the course of either ureter, or within the lumen of the urinary bladder. No hydroureteronephrosis or perinephric stranding. Bilateral kidneys and adrenal glands are normal in appearance.  Unenhanced appearance of the urinary bladder is normal. Stomach/Bowel: Unenhanced appearance of the  stomach is normal. There is no pathologic dilatation of small bowel or colon. The appendix is not confidently identified and may be surgically absent. Regardless, there are no inflammatory changes noted adjacent to the cecum to suggest the presence of an acute appendicitis at this time. Vascular/Lymphatic: Aortic atherosclerosis. No lymphadenopathy noted in the abdomen or pelvis. Reproductive: Prostate gland and seminal vesicles are unremarkable in appearance. Other: No significant volume of ascites.  No pneumoperitoneum. Musculoskeletal: There are no aggressive appearing lytic or blastic lesions noted in the visualized portions of the skeleton. IMPRESSION: 1. No acute findings are noted in the abdomen or pelvis to account for the patient's symptoms. 2. Hepatic steatosis. 3. Aortic atherosclerosis. 4. Additional incidental findings, similar to prior studies, as above. Electronically Signed   By: Vinnie Langton M.D.   On: 03/03/2021 07:26    ED COURSE and MDM  Nursing notes, initial and subsequent vitals signs, including pulse oximetry, reviewed and interpreted by myself.  Vitals:   03/03/21 1615 03/03/21 1734 03/03/21 2126 03/03/21 2225  BP: 120/86 (!) 147/100 (!) 147/89 (!) 146/79  Pulse: (!) 102 (!) 102 (!) 121 (!) 118  Resp: 16 20 20 20   Temp:  (!) 100.4 F (38 C) (!) 102.5 F (39.2 C) 100.1 F (37.8 C)  TempSrc:  Oral Oral   SpO2: 97% 99% 95% 97%  Weight:  92.9 kg    Height:  6\' 1"  (1.854 m)     Medications  lactated ringers bolus 1,000 mL (has no administration in time range)  promethazine (PHENERGAN) 25 mg in sodium chloride 0.9 % 50 mL IVPB (0 mg Intravenous Stopped 03/03/21 0420)  HYDROmorphone (DILAUDID) injection 1 mg (1 mg Intravenous Given 03/03/21 0443)  simethicone (MYLICON) 40 FB/5.1WC suspension 80 mg (80 mg Oral Given 03/03/21 0442)   7:00 AM Patient signed out to Dr. Karle Starch.   Although the patient has had a longstanding history of chronic pancreatitis (usually with normal lipase levels) he has never had elevated LFTs in the past.  A CT scan of the abdomen and pelvis is pending as well as hepatitis panel.     PROCEDURES  Procedures   ED DIAGNOSES     ICD-10-CM   1. Cholangitis  K83.09     2. Elevated liver enzymes  R74.8     3. Chronic abdominal pain  R10.9    G89.29          Jevaeh Shams, Jenny Reichmann, MD 03/03/21 2245

## 2021-03-03 NOTE — ED Provider Notes (Signed)
Care of the patient assumed at the change of shift. Well known to this department for chronic abdominal pain/chronic pancreatitis. He has elevated liver enzymes today which is unusual for him. Lipase is normal. Pending CT.  Physical Exam  BP 135/87 (BP Location: Right Arm)   Pulse (!) 105   Temp 99.8 F (37.7 C) (Oral)   Resp 16   Ht 6\' 1"  (1.854 m)   Wt 93 kg   SpO2 100%   BMI 27.05 kg/m   Physical Exam  ED Course/Procedures   Clinical Course as of 03/03/21 1036  Fri Mar 03, 2021  0751 CT neg for acute process. Additional pain meds ordered. Will discuss with GI.  [CS]  [phone] Patient now with fever. Will add cultures and lactic acid. He cannot have APAP given his elevated liver enzymes, has reported NSAID allergy (Toradol).  [CS]  [id number] Patient states he can tolerate oral Motrin [CS]  5501 Spoke with Frederik Pear, [place] with [facility] who will see in consult. Hospitalist paged for admission.  [CS]  0901 Spoke with Dr. Olevia Bowens, Hospitalist, who will accept for admission.  [CS]    Clinical Course User Index [CS] Truddie Hidden, MD    Procedures  MDM         Truddie Hidden, MD 03/03/21 1037

## 2021-03-03 NOTE — H&P (Signed)
[facility] History and Physical  [name] SHF:[id number] DOB: 04/02/1976 DOA: 03/03/2021 PCP: Martinique, Betty G, MD  Admitted from: home Chief Complaint: Abdominal pain  History of Present Illness: [name] is a 45 y.o. male with past medical history of chronic abdominal pain, chronic pancreatitis, diverticulosis, nephrolithiasis, hypertension, drug-seeking behavior. Patient presented to ED late last night/early this morning with complaint of abdominal pain with vomiting, symptoms that started yesterday consistent with his past episodes of pancreatitis.  While in the ED, he had a fever of 102.5, tachycardic of 210, blood pressure was elevated upto 170, breathing on room air. Labs with WBC count at 13.2, hemoglobin, platelets normal, BUN/creatinine normal, lipase level slightly elevated to 53.  AST/ALT/alk phos/total bilirubin all elevated to 512/505/173/4.3.  Lactic acid level normal. CT abdomen pelvis did not show any acute findings except for hepatic steatosis.  Patient was transferred to hospitalist service at [facility] for further evaluation management.  At the time of my evaluation, patient was lying down in bed.  He was having chills. Reports that he has had episodes of pancreatitis since an ERCP several years ago. Reports taking oxycodone 10 mg 4 times a day at home on a regular basis. Last episode of vomiting was this afternoon while in the ED.  Would like to try some clear liquid diet.   Review of Systems:  All systems were reviewed and were negative unless otherwise mentioned in the HPI   Past medical history: Past Medical History:  Diagnosis Date   Chronic abdominal pain    Diverticulitis    Drug-seeking behavior    Hypertension    Kidney stones    Pancreatitis, chronic ([place]) 05/28/2005    Past surgical history: Past Surgical History:  Procedure Laterality Date   APPENDECTOMY     CHOLECYSTECTOMY     ERCP N/A 12/30/2019   Procedure:  ENDOSCOPIC RETROGRADE CHOLANGIOPANCREATOGRAPHY (ERCP);  Surgeon: Milus Banister, MD;  Location: [facility];  Service: Endoscopy;  Laterality: N/A;   ERCP W/ METAL STENT PLACEMENT     KIDNEY STONE SURGERY     REMOVAL OF STONES  12/30/2019   Procedure: REMOVAL OF STONES;  Surgeon: Milus Banister, MD;  Location: [facility];  Service: Endoscopy;;    Social History:  reports that he has been smoking cigarettes. He has been smoking an average of .5 packs per day. He has never used smokeless tobacco. He reports that he does not currently use alcohol. He reports that he does not use drugs.  Allergies:  Allergies  Allergen Reactions   Cortisone Other (See Comments)    drops potassium level "bottoms out" potassium level drops potassium level Bottoms out potassium  "bottoms out" potassium level Other reaction(s): Other (See Comments) Bottoms out potassium   Eggs Or Egg-Derived Products Hives and Other (See Comments)    Rash  Rash   Ketorolac Hives and Other (See Comments)    Hives, slightly labored breathing  Hives, slightly labored breathing   Ketorolac Tromethamine Hives and Shortness Of Breath   Haldol [Haloperidol Lactate] Other (See Comments)    "jittery"    Haloperidol Other (See Comments)    "jittery" "jittery"    Hydrocortisone Hives    Also drops potassium level   Iodinated Diagnostic Agents Nausea And Vomiting and Other (See Comments)    Hives Hives Hives Hives   Ketorolac Tromethamine Hives   Reglan [Metoclopramide]     Face "draws" and gets figgety   Compazine [Prochlorperazine] Other (See Comments)  Dystonic reaction    Family history:  Family History  Problem Relation Age of Onset   Cancer Mother    Early death Mother    Hypertension Mother    Heart failure Father    Hypertension Father    Heart disease Father    Hyperlipidemia Father    Pancreatic cancer Paternal Grandmother        possibly   Pancreatic cancer Paternal Aunt    Colon cancer  Neg Hx      Home Meds: Prior to Admission medications   Medication Sig Start Date End Date Taking? Authorizing Provider  amLODipine-benazepril (LOTREL) 5-20 MG capsule TAKE 1 CAPSULE BY MOUTH EVERY DAY Patient taking differently: Take 1 capsule by mouth daily. 11/12/20   Martinique, Betty G, MD  fluticasone [facility]) 50 MCG/ACT nasal spray Place 1 spray into both nostrils 2 (two) times daily. 04/03/19   Martinique, Betty G, MD  metoprolol succinate (TOPROL-XL) 100 MG 24 hr tablet TAKE 1 TABLET (100 MG TOTAL) BY MOUTH DAILY. TAKE WITH OR IMMEDIATELY FOLLOWING A MEAL. 10/25/20   Martinique, Betty G, MD  naloxone [facility]) nasal spray 4 mg/0.1 mL 1 spray in each nostril x 1 if opioid overdose. 06/08/20   Martinique, Betty G, MD  ondansetron (ZOFRAN ODT) 8 MG disintegrating tablet Take 1 tablet (8 mg total) by mouth every 8 (eight) hours as needed for nausea or vomiting. 12/25/20   Lajean Saver, MD  Oxycodone HCl 10 MG TABS Take 1 tablet (10 mg total) by mouth every 8 (eight) hours as needed (for moderate - severe pain). 02/24/21   Martinique, Betty G, MD  pantoprazole (PROTONIX) 40 MG tablet Take 1 tablet (40 mg total) by mouth daily. 12/25/20   Lajean Saver, MD  promethazine (PHENERGAN) 25 MG tablet TAKE 1 TABLET BY MOUTH EVERY 12 HOURS AS NEEDED Patient taking differently: Take 25 mg by mouth every 12 (twelve) hours as needed for nausea or vomiting. 01/16/21   Martinique, Betty G, MD    Physical Exam: Vitals:   03/03/21 1400 03/03/21 1530 03/03/21 1615 03/03/21 1734  BP: (!) 139/98 117/70 120/86 (!) 147/100  Pulse: (!) 101 89 (!) 102 (!) 102  Resp: _0 Temp:    (!) 100.4 F (38 C)  TempSrc:    Oral  SpO2: 94% 95% 97% 99%  Weight:    92.9 kg  Height:    _1  (1.854 m)   Wt Readings from Last 3 Encounters:  03/03/21 92.9 kg  02/17/21 95.3 kg  01/29/21 92.5 kg   Body mass index is 27.02 kg/m.  General exam: Young Caucasian male.  Having some chills Skin: No rashes, lesions or ulcers. HEENT: Atraumatic,  normocephalic, no obvious bleeding Lungs: Clear to auscultation bilaterally CVS: Regular rate and rhythm, no murmur GI/Abd soft, mild to moderate tenderness present in the epigastrium, bowel sound present, CNS: Alert, awake, oriented x3 Psychiatry: Mood appropriate Extremities: No pedal edema, no calf tenderness     Consult Orders  (From admission, onward)           Start     Ordered   03/03/21 0825  Consult to hospitalist  Cholangitis; called @ 8:48am.  Once       Comments: Cholangitis  Provider:  (Not yet assigned)  Question Answer Comment  Place call to: [facility]   Reason for Consult Admit      03/03/21 0824            Labs on Admission:  CBC: Recent Labs  Lab 03/03/21 0345  WBC 13.2*  NEUTROABS 9.8*  HGB 15.8  HCT 44.1  MCV 83.8  PLT 161    Basic Metabolic Panel: Recent Labs  Lab 03/03/21 0345  NA 137  K 3.5  CL 107  CO2 21*  GLUCOSE 122*  BUN 15  CREATININE 0.85  CALCIUM 9.5    Liver Function Tests: Recent Labs  Lab 03/03/21 0345  AST 512*  ALT 505*  ALKPHOS 173*  BILITOT 4.3*  PROT 7.8  ALBUMIN 4.3   Recent Labs  Lab 03/03/21 0345  LIPASE 53*   No results for input(s): AMMONIA in the last 168 hours.  Cardiac Enzymes: No results for input(s): CKTOTAL, CKMB, CKMBINDEX, TROPONINI in the last 168 hours.  BNP (last 3 results) No results for input(s): BNP in the last 8760 hours.  ProBNP (last 3 results) No results for input(s): PROBNP in the last 8760 hours.  CBG: No results for input(s): GLUCAP in the last 168 hours.  Lipase     Component Value Date/Time   LIPASE 53 (H) 03/03/2021 0345     Urinalysis    Component Value Date/Time   COLORURINE YELLOW 02/17/2021 2347   APPEARANCEUR CLEAR 02/17/2021 2347   LABSPEC 1.019 02/17/2021 2347   PHURINE 7.0 02/17/2021 2347   GLUCOSEU NEGATIVE 02/17/2021 2347   GLUCOSEU NEGATIVE 04/18/2018 1150   HGBUR NEGATIVE 02/17/2021 2347   BILIRUBINUR NEGATIVE 02/17/2021  2347   KETONESUR NEGATIVE 02/17/2021 2347   PROTEINUR TRACE (A) 02/17/2021 2347   UROBILINOGEN 0.2 04/18/2018 1150   NITRITE NEGATIVE 02/17/2021 2347   LEUKOCYTESUR NEGATIVE 02/17/2021 2347     Drugs of Abuse     Component Value Date/Time   LABOPIA NONE DETECTED 02/15/2019 1924   COCAINSCRNUR NONE DETECTED 02/15/2019 1924   LABBENZ NONE DETECTED 02/15/2019 1924   AMPHETMU NONE DETECTED 02/15/2019 1924   THCU NONE DETECTED 02/15/2019 1924   LABBARB NONE DETECTED 02/15/2019 1924      Radiological Exams on Admission: CT ABDOMEN PELVIS WO CONTRAST  Result Date: 03/03/2021 CLINICAL DATA:  45 year old male with history of elevated liver function tests. Abdominal pain and vomiting. EXAM: CT ABDOMEN AND PELVIS WITHOUT CONTRAST TECHNIQUE: Multidetector CT imaging of the abdomen and pelvis was performed following the standard protocol without IV contrast. COMPARISON:  CT the abdomen and pelvis 12/25/2019. FINDINGS: Lower chest: Unremarkable. Hepatobiliary: No suspicious cystic or solid hepatic lesions. No definite suspicious cystic or solid hepatic lesions are confidently identified on today's noncontrast CT examination. Mild diffuse low attenuation throughout the hepatic parenchyma, indicative of a background of hepatic steatosis. Status post cholecystectomy. Mild intra and extrahepatic biliary ductal dilatation, similar to the prior study, likely reflective of benign post cholecystectomy physiology. Small amount of pneumobilia, similar to the prior study, presumably indicative of prior sphincterotomy. Pancreas: No definite pancreatic mass or peripancreatic fluid collections or inflammatory changes are noted on today's noncontrast CT examination. Spleen: Unremarkable. Adrenals/Urinary Tract: No calcifications are identified within the collecting system of either kidney, along the course of either ureter, or within the lumen of the urinary bladder. No hydroureteronephrosis or perinephric stranding.  Bilateral kidneys and adrenal glands are normal in appearance. Unenhanced appearance of the urinary bladder is normal. Stomach/Bowel: Unenhanced appearance of the stomach is normal. There is no pathologic dilatation of small bowel or colon. The appendix is not confidently identified and may be surgically absent. Regardless, there are no inflammatory changes noted adjacent to the cecum to suggest the presence of an acute appendicitis  at this time. Vascular/Lymphatic: Aortic atherosclerosis. No lymphadenopathy noted in the abdomen or pelvis. Reproductive: Prostate gland and seminal vesicles are unremarkable in appearance. Other: No significant volume of ascites.  No pneumoperitoneum. Musculoskeletal: There are no aggressive appearing lytic or blastic lesions noted in the visualized portions of the skeleton. IMPRESSION: 1. No acute findings are noted in the abdomen or pelvis to account for the patient's symptoms. 2. Hepatic steatosis. 3. Aortic atherosclerosis. 4. Additional incidental findings, similar to prior studies, as above. Electronically Signed   By: Vinnie Langton M.D.   On: 03/03/2021 07:26     ------------------------------------------------------------------------------------------------------ Assessment/Plan: Principal Problem:   Acute cholangitis  SIRS -Febrile up to 102.5, tachycardic with leukocytosis and elevated liver enzymes. -No clear source of infection. -Blood cultures sent from ED. was empirically started on IV Zosyn. -Lactic acid level normal.  Continue IV Zosyn.  Follow-up blood culture report.  -Continue to trend WBC and fever. Recent Labs  Lab 03/03/21 0345 03/03/21 0845  WBC 13.2*  --   LATICACIDVEN  --  1.3   Elevated liver enzymes -Known case of hepatic steatosis but liver enzymes level and total bilirubin are all higher than baseline.  Unclear etiology. -We will send work-up for acute hepatitis panel -GI consulted from ED. Recent Labs  Lab 03/03/21 0345  AST  512*  ALT 505*  ALKPHOS 173*  BILITOT 4.3*  PROT 7.8  ALBUMIN 4.3   Intractable nausea, vomiting and abdominal pain -Unclear etiology.  Symptomatic management with antiemetics, IV fluid.  Hypertension -Home meds include metoprolol, amlodipine, benazepril. -Resume metoprolol and amlodipine.  Continue to monitor blood pressure.  Hydralazine IV as needed  Chronic pancreatitis -Only mildly increased lipase level.  CT abdomen with no evidence of acute pancreatitis Recent Labs  Lab 03/03/21 0345  LIPASE 53*   Acute on chronic abdominal pain Reported history of drug-seeking behavior -Chronic pain likely related to chronic pancreatitis, nephrolithiasis, diverticulosis. -At home patient was on oxycodone as needed. -IV Dilaudid as needed for severe pain for next 24 hours.  Continue oxycodone oral as needed for moderate pain  Mobility: Encourage ambulation Code Status:   Code Status: Full Code  DVT prophylaxis: Lovenox subcu Antimicrobials: IV Zosyn Fluid: NS_0  mill per hour  Diet:  Diet Order             Diet clear liquid Room service appropriate? Yes; Fluid consistency: Thin  Diet effective now                    Consultants: GI Family Communication: None at bedside    Dispo: The patient is from: Home              Anticipated d/c is to: Home              Anticipated d/c date is: 2 to 3 days  ------------------------------------------------------------------------------------- Severity of Illness: The appropriate patient status for this patient is OBSERVATION. Observation status is judged to be reasonable and necessary in order to provide the required intensity of service to ensure the patient's safety. The patient's presenting symptoms, physical exam findings, and initial radiographic and laboratory data in the context of their medical condition is felt to place them at decreased risk for further clinical deterioration. Furthermore, it is anticipated that the patient  will be medically stable for discharge from the hospital within 2 midnights of admission. The following factors support the patient status of observation.   " The patient's presenting symptoms include nausea, vomiting. " The physical  exam findings include abdominal tenderness. " The initial radiographic and laboratory data are elevated liver enzymes.   Signed, Terrilee Croak, MD [facility] 03/03/2021

## 2021-03-04 ENCOUNTER — Encounter (HOSPITAL_COMMUNITY)
Admission: EM | Disposition: A | Discharge status: Home / Self Care | Payer: Self-pay | Source: Home / Self Care | Attending: Internal Medicine

## 2021-03-04 ENCOUNTER — Observation Stay (HOSPITAL_COMMUNITY): Payer: BLUE CROSS/BLUE SHIELD | Admitting: Certified Registered Nurse Anesthetist

## 2021-03-04 ENCOUNTER — Observation Stay (HOSPITAL_COMMUNITY): Payer: BLUE CROSS/BLUE SHIELD

## 2021-03-04 DIAGNOSIS — R7989 Other specified abnormal findings of blood chemistry: Secondary | ICD-10-CM | POA: Diagnosis not present

## 2021-03-04 DIAGNOSIS — Z79899 Other long term (current) drug therapy: Secondary | ICD-10-CM | POA: Diagnosis not present

## 2021-03-04 DIAGNOSIS — R1010 Upper abdominal pain, unspecified: Secondary | ICD-10-CM | POA: Diagnosis not present

## 2021-03-04 DIAGNOSIS — K8030 Calculus of bile duct with cholangitis, unspecified, without obstruction: Secondary | ICD-10-CM | POA: Diagnosis present

## 2021-03-04 DIAGNOSIS — K8309 Other cholangitis: Secondary | ICD-10-CM | POA: Diagnosis present

## 2021-03-04 DIAGNOSIS — Z765 Malingerer [conscious simulation]: Secondary | ICD-10-CM | POA: Diagnosis not present

## 2021-03-04 DIAGNOSIS — Z8249 Family history of ischemic heart disease and other diseases of the circulatory system: Secondary | ICD-10-CM | POA: Diagnosis not present

## 2021-03-04 DIAGNOSIS — Z91012 Allergy to eggs: Secondary | ICD-10-CM | POA: Diagnosis not present

## 2021-03-04 DIAGNOSIS — Z888 Allergy status to other drugs, medicaments and biological substances status: Secondary | ICD-10-CM | POA: Diagnosis not present

## 2021-03-04 DIAGNOSIS — K8050 Calculus of bile duct without cholangitis or cholecystitis without obstruction: Secondary | ICD-10-CM | POA: Diagnosis not present

## 2021-03-04 DIAGNOSIS — F17210 Nicotine dependence, cigarettes, uncomplicated: Secondary | ICD-10-CM | POA: Diagnosis present

## 2021-03-04 DIAGNOSIS — D696 Thrombocytopenia, unspecified: Secondary | ICD-10-CM | POA: Diagnosis present

## 2021-03-04 DIAGNOSIS — A419 Sepsis, unspecified organism: Secondary | ICD-10-CM | POA: Diagnosis present

## 2021-03-04 DIAGNOSIS — Z20822 Contact with and (suspected) exposure to covid-19: Secondary | ICD-10-CM | POA: Diagnosis present

## 2021-03-04 DIAGNOSIS — I10 Essential (primary) hypertension: Secondary | ICD-10-CM | POA: Diagnosis present

## 2021-03-04 DIAGNOSIS — G8929 Other chronic pain: Secondary | ICD-10-CM | POA: Diagnosis present

## 2021-03-04 DIAGNOSIS — Z87442 Personal history of urinary calculi: Secondary | ICD-10-CM | POA: Diagnosis not present

## 2021-03-04 DIAGNOSIS — Z91041 Radiographic dye allergy status: Secondary | ICD-10-CM | POA: Diagnosis not present

## 2021-03-04 DIAGNOSIS — K861 Other chronic pancreatitis: Secondary | ICD-10-CM | POA: Diagnosis present

## 2021-03-04 HISTORY — PX: REMOVAL OF STONES: SHX5545

## 2021-03-04 HISTORY — PX: SPHINCTEROTOMY: SHX5544

## 2021-03-04 HISTORY — PX: ERCP: SHX5425

## 2021-03-04 LAB — COMPREHENSIVE METABOLIC PANEL
ALT: 443 U/L — ABNORMAL HIGH (ref 0–44)
AST: 242 U/L — ABNORMAL HIGH (ref 15–41)
Albumin: 3.8 g/dL (ref 3.5–5.0)
Alkaline Phosphatase: 205 U/L — ABNORMAL HIGH (ref 38–126)
Anion gap: 10 (ref 5–15)
BUN: 8 mg/dL (ref 6–20)
CO2: 22 mmol/L (ref 22–32)
Calcium: 8.4 mg/dL — ABNORMAL LOW (ref 8.9–10.3)
Chloride: 103 mmol/L (ref 98–111)
Creatinine, Ser: 0.61 mg/dL (ref 0.61–1.24)
GFR, Estimated: >60 mL/min (ref >60)
Glucose, Bld: 123 mg/dL — ABNORMAL HIGH (ref 70–99)
Potassium: 3.4 mmol/L — ABNORMAL LOW (ref 3.5–5.1)
Sodium: 135 mmol/L (ref 135–145)
Total Bilirubin: 5.9 mg/dL — ABNORMAL HIGH (ref 0.3–1.2)
Total Protein: 7.1 g/dL (ref 6.5–8.1)

## 2021-03-04 LAB — CBC
HCT: 39.3 % (ref 39.0–52.0)
Hemoglobin: 13.7 g/dL (ref 13.0–17.0)
MCH: 30 pg (ref 26.0–34.0)
MCHC: 34.9 g/dL (ref 30.0–36.0)
MCV: 86 fL (ref 80.0–100.0)
Platelets: 139 10*3/uL — ABNORMAL LOW (ref 150–400)
RBC: 4.57 MIL/uL (ref 4.22–5.81)
RDW: 13.2 % (ref 11.5–15.5)
WBC: 10.6 10*3/uL — ABNORMAL HIGH (ref 4.0–10.5)
nRBC: 0 % (ref 0.0–0.2)

## 2021-03-04 LAB — BILIRUBIN, DIRECT: Bilirubin, Direct: 4.1 mg/dL — ABNORMAL HIGH (ref 0.0–0.2)

## 2021-03-04 LAB — HIV ANTIBODY (ROUTINE TESTING W REFLEX): HIV Screen 4th Generation wRfx: NONREACTIVE

## 2021-03-04 LAB — LIPASE, BLOOD: Lipase: 38 U/L (ref 11–51)

## 2021-03-04 SURGERY — ERCP, WITH INTERVENTION IF INDICATED
Anesthesia: General

## 2021-03-04 MED ORDER — FENTANYL CITRATE (PF) 100 MCG/2ML IJ SOLN: 25.0000 ug | INTRAMUSCULAR | Status: DC | PRN

## 2021-03-04 MED ORDER — AMISULPRIDE (ANTIEMETIC) 5 MG/2ML IV SOLN
10.0000 mg | Freq: Once | INTRAVENOUS | Status: DC | PRN
  Filled 2021-03-04: qty 4

## 2021-03-04 MED ORDER — PROPOFOL 10 MG/ML IV BOLUS
INTRAVENOUS | Status: DC | PRN
  Administered 2021-03-04: 150 mg via INTRAVENOUS

## 2021-03-04 MED ORDER — LACTATED RINGERS IV SOLN: INTRAVENOUS | Status: DC | PRN

## 2021-03-04 MED ORDER — SUCCINYLCHOLINE CHLORIDE 200 MG/10ML IV SOSY
PREFILLED_SYRINGE | INTRAVENOUS | Status: DC | PRN
  Administered 2021-03-04: 140 mg via INTRAVENOUS

## 2021-03-04 MED ORDER — GLUCAGON HCL RDNA (DIAGNOSTIC) 1 MG IJ SOLR
INTRAMUSCULAR | Status: AC
  Filled 2021-03-04: qty 1

## 2021-03-04 MED ORDER — FENTANYL CITRATE (PF) 100 MCG/2ML IJ SOLN
INTRAMUSCULAR | Status: AC
  Filled 2021-03-04: qty 2

## 2021-03-04 MED ORDER — INDOMETHACIN 50 MG RE SUPP
RECTAL | Status: AC
  Filled 2021-03-04: qty 2

## 2021-03-04 MED ORDER — SODIUM CHLORIDE 0.9 % IV SOLN: INTRAVENOUS | Status: DC

## 2021-03-04 MED ORDER — PROPOFOL 10 MG/ML IV BOLUS
INTRAVENOUS | Status: AC
  Filled 2021-03-04: qty 20

## 2021-03-04 MED ORDER — FENTANYL CITRATE (PF) 100 MCG/2ML IJ SOLN
INTRAMUSCULAR | Status: DC | PRN
  Administered 2021-03-04 (×4): 50 ug via INTRAVENOUS

## 2021-03-04 MED ORDER — HYDROMORPHONE HCL 1 MG/ML IJ SOLN
1.0000 mg | Freq: Once | INTRAMUSCULAR | Status: AC
  Administered 2021-03-04: 1 mg via INTRAVENOUS
  Filled 2021-03-04: qty 1

## 2021-03-04 MED ORDER — LIDOCAINE 2% (20 MG/ML) 5 ML SYRINGE
INTRAMUSCULAR | Status: DC | PRN
  Administered 2021-03-04: 100 mg via INTRAVENOUS

## 2021-03-04 NOTE — Op Note (Signed)
[facility] Patient Name: [name] Procedure Date: 03/04/2021 MRN: [id number] Attending MD: Carol Ada , MD Date of Birth: 28-Apr-1976 CSN: [id number] Age: 45 Admit Type: Inpatient Procedure:                ERCP Indications:              Common bile duct stone(s) Providers:                Carol Ada, MD, Grace Isaac, RN, Ladona Ridgel,                            Technician Referring MD:              Medicines:                General Anesthesia Complications:            No immediate complications. Estimated Blood Loss:     Estimated blood loss: none. Procedure:                Pre-Anesthesia Assessment:                           - Prior to the procedure, a History and Physical                            was performed, and patient medications and                            allergies were reviewed. The patient's tolerance of                            previous anesthesia was also reviewed. The risks                            and benefits of the procedure and the sedation                            options and risks were discussed with the patient.                            All questions were answered, and informed consent                            was obtained. Prior Anticoagulants: The patient has                            taken no previous anticoagulant or antiplatelet                            agents. ASA Grade Assessment: II - A patient with                            mild systemic disease. After reviewing the risks                            and benefits,  the patient was deemed in                            satisfactory condition to undergo the procedure.                           - Sedation was administered by an anesthesia                            professional. General anesthesia was attained.                           After obtaining informed consent, the scope was                            passed under direct vision. Throughout the                             procedure, the patient's blood pressure, pulse, and                            oxygen saturations were monitored continuously. The                            [id number]-Q180V ([id number]) Olympus duodenoscope was                            introduced through the mouth, and used to inject                            contrast into and used to inject contrast into the                            bile duct. The ERCP was accomplished without                            difficulty. The patient tolerated the procedure                            well. Scope In: Scope Out: Findings:      A biliary sphincteroplasty had been performed. The sphincteroplasty       appeared open. The minor papilla was normal. A short 0.035 inch Soft       Jagwire was passed into the biliary tree. A 4 mm biliary sphincterotomy       was made with a monofilament traction (standard) sphincterotome using       ERBE electrocautery. There was no post-sphincterotomy bleeding. The       biliary tree was swept with a 15 mm balloon starting at the bifurcation.       A few stones were removed. No stones remained.      Initial inspection of the CBD appeared to be normal, even though a prior       sphincterotomy was performed at some point. There was no spontaneous       drainage of bile. The CBD was easily cannulated and  the guidewire was       secured in the right intrahepatic ducts. With insertion of the       sphinctertome a copious amount of clear bile extruded from the CBD.       Contrast injection showed a dilated CBD at approximately 13-14 mm. It       was not clear if there was any evidence of a filling defect. Because       there was no spontaneous drainage of bile, the sphincterotomy was       augmented. The initial sweep of the CBD at 12 mm in the distal CBD       produced a soft stone. At 15 mm, and starting at the bifircation, a       large amount of soft stone(s) were removed. Several attempts to       cannulate the  left intrahepatic ducts failed. This was tried as there       was the possibility of unseen stones in this region. Despite multiple       attempts and trials cannulation of the left intrahepatics duct was not       achieved. The final occlusion cholangiogram was negative for any       retained stones. No cannulatoion of the PD occurred. Impression:               - The prior biliary sphincteroplasty appeared open.                           - The minor papilla appeared normal.                           - Choledocholithiasis was found. Complete removal                            was accomplished by biliary sphincterotomy and                            balloon extraction.                           - A biliary sphincterotomy was performed.                           - The biliary tree was swept. Moderate Sedation:      Not Applicable - Patient had care per Anesthesia. Recommendation:           - Return patient to hospital ward for ongoing care.                           - Resume regular diet.                           - ? benefit with repeating a secondary prophylactic                            ERCP in 6-8 months.                           - The use of Urosodiol may also be  considered to                            decrease, delay, or prevent the formation of stones. Procedure Code(s):        --- Professional ---                           [id number], Endoscopic retrograde                            cholangiopancreatography (ERCP); with removal of                            calculi/debris from biliary/pancreatic duct(s)                           43262, Endoscopic retrograde                            cholangiopancreatography (ERCP); with                            sphincterotomy/papillotomy Diagnosis Code(s):        --- Professional ---                           K80.50, Calculus of bile duct without cholangitis                            or cholecystitis without obstruction CPT copyright 2019  American Medical Association. All rights reserved. The codes documented in this report are preliminary and upon coder review may  be revised to meet current compliance requirements. Carol Ada, MD Carol Ada, MD 03/04/2021 2:20:42 PM This report has been signed electronically. Number of Addenda: 0

## 2021-03-04 NOTE — Plan of Care (Signed)
Care plan initiated general adult- pain management will be a focus of care concerns.

## 2021-03-04 NOTE — Consult Note (Signed)
CONSULT FOR [name]  Reason for Consult:Choledocholithiasis Referring Physician: [facility]  [name] HPI: This is a 45 year old male with a PMH of choledocholithiasis s/p ERCP on 12/30/2019, s/p cholecystectomy for gallstones, chronic pancreatitis, HTN, drug-seeking behavior, and nephrolithiasis admitted to the hospital with a possible recurrent choledocholithiasis.  His symptoms started acutely yesterday.  He thought it was a flare of his chronic pancreatitis, but his symptoms continued to progress.  He had more nausea, vomiting, and abdominal pain.  Upon presentation to the ER he was noted to be febrile at 103 and he had an elevation in his liver enzymes:  AST 512, ALT 505, AP 173, and TB 4.3.  His symptoms are the same was before when he presented with choledocholithiasis.  Today his liver enzymes decreased with the exception of the AP.  He still has pain in the RUQ/epigastric region.  Past Medical History:  Diagnosis Date   Chronic abdominal pain    Diverticulitis    Drug-seeking behavior    Hypertension    Kidney stones    Pancreatitis, chronic ([place]) 05/28/2005    Past Surgical History:  Procedure Laterality Date   APPENDECTOMY     CHOLECYSTECTOMY     ERCP N/A 12/30/2019   Procedure: ENDOSCOPIC RETROGRADE CHOLANGIOPANCREATOGRAPHY (ERCP);  Surgeon: Milus Banister, MD;  Location: [facility];  Service: Endoscopy;  Laterality: N/A;   ERCP W/ METAL STENT PLACEMENT     KIDNEY STONE SURGERY     REMOVAL OF STONES  12/30/2019   Procedure: REMOVAL OF STONES;  Surgeon: Milus Banister, MD;  Location: [facility];  Service: Endoscopy;;    Family History  Problem Relation Age of Onset   Cancer Mother    Early death Mother    Hypertension Mother    Heart failure Father    Hypertension Father    Heart disease Father    Hyperlipidemia Father    Pancreatic cancer Paternal Grandmother        possibly   Pancreatic cancer Paternal Aunt    Colon cancer Neg Hx      Social History:  reports that he has been smoking cigarettes. He has been smoking an average of .5 packs per day. He has never used smokeless tobacco. He reports that he does not currently use alcohol. He reports that he does not use drugs.  Allergies:  Allergies  Allergen Reactions   Cortisone Other (See Comments)    drops potassium level "bottoms out" potassium level drops potassium level Bottoms out potassium  "bottoms out" potassium level Other reaction(s): Other (See Comments) Bottoms out potassium   Eggs Or Egg-Derived Products Hives and Other (See Comments)    Rash  Rash   Ketorolac Hives and Other (See Comments)    Hives, slightly labored breathing  Hives, slightly labored breathing   Ketorolac Tromethamine Hives and Shortness Of Breath   Haldol [Haloperidol Lactate] Other (See Comments)    "jittery"    Haloperidol Other (See Comments)    "jittery" "jittery"    Hydrocortisone Hives    Also drops potassium level   Iodinated Diagnostic Agents Nausea And Vomiting and Other (See Comments)    Hives Hives Hives Hives   Ketorolac Tromethamine Hives   Reglan [Metoclopramide]     Face "draws" and gets figgety   Compazine [Prochlorperazine] Other (See Comments)    Dystonic reaction    Medications: Scheduled:  [MAR Hold] amLODipine  5 mg Oral Daily   [MAR Hold] enoxaparin (LOVENOX) injection  40  mg Subcutaneous Q24H   [MAR Hold] metoprolol succinate  100 mg Oral Q breakfast   Continuous:  [MAR Hold] sodium chloride Stopped (03/03/21 1619)   sodium chloride 100 mL/hr at 03/03/21 1907   sodium chloride     [MAR Hold] piperacillin-tazobactam (ZOSYN)  IV 3.375 g (03/04/21 0731)    Results for orders placed or performed during the hospital encounter of 03/03/21 (from the past 24 hour(s))  HIV Antibody (routine testing w rflx)     Status: None   Collection Time: 03/04/21  5:48 AM  Result Value Ref Range   HIV Screen 4th Generation wRfx Non Reactive Non  Reactive  CBC     Status: Abnormal   Collection Time: 03/04/21  5:48 AM  Result Value Ref Range   WBC 10.6 (H) 4.0 - 10.5 K/uL   RBC 4.57 4.22 - 5.81 MIL/uL   Hemoglobin 13.7 13.0 - 17.0 g/dL   HCT 39.3 39.0 - 52.0 %   MCV 86.0 80.0 - 100.0 fL   MCH 30.0 26.0 - 34.0 pg   MCHC 34.9 30.0 - 36.0 g/dL   RDW 13.2 11.5 - 15.5 %   Platelets 139 (L) 150 - 400 K/uL   nRBC 0.0 0.0 - 0.2 %  Comprehensive metabolic panel     Status: Abnormal   Collection Time: 03/04/21  5:48 AM  Result Value Ref Range   Sodium 135 135 - 145 mmol/L   Potassium 3.4 (L) 3.5 - 5.1 mmol/L   Chloride 103 98 - 111 mmol/L   CO2 22 22 - 32 mmol/L   Glucose, Bld 123 (H) 70 - 99 mg/dL   BUN 8 6 - 20 mg/dL   Creatinine, Ser 0.61 0.61 - 1.24 mg/dL   Calcium 8.4 (L) 8.9 - 10.3 mg/dL   Total Protein 7.1 6.5 - 8.1 g/dL   Albumin 3.8 3.5 - 5.0 g/dL   AST 242 (H) 15 - 41 U/L   ALT 443 (H) 0 - 44 U/L   Alkaline Phosphatase 205 (H) 38 - 126 U/L   Total Bilirubin 5.9 (H) 0.3 - 1.2 mg/dL   GFR, Estimated >60 >60 mL/min   Anion gap 10 5 - 15  Bilirubin, direct     Status: Abnormal   Collection Time: 03/04/21  5:48 AM  Result Value Ref Range   Bilirubin, Direct 4.1 (H) 0.0 - 0.2 mg/dL  Lipase, blood     Status: None   Collection Time: 03/04/21  5:48 AM  Result Value Ref Range   Lipase 38 11 - 51 U/L     CT ABDOMEN PELVIS WO CONTRAST  Result Date: 03/03/2021 CLINICAL DATA:  44 year old male with history of elevated liver function tests. Abdominal pain and vomiting. EXAM: CT ABDOMEN AND PELVIS WITHOUT CONTRAST TECHNIQUE: Multidetector CT imaging of the abdomen and pelvis was performed following the standard protocol without IV contrast. COMPARISON:  CT the abdomen and pelvis 12/25/2019. FINDINGS: Lower chest: Unremarkable. Hepatobiliary: No suspicious cystic or solid hepatic lesions. No definite suspicious cystic or solid hepatic lesions are confidently identified on today's noncontrast CT examination. Mild diffuse low  attenuation throughout the hepatic parenchyma, indicative of a background of hepatic steatosis. Status post cholecystectomy. Mild intra and extrahepatic biliary ductal dilatation, similar to the prior study, likely reflective of benign post cholecystectomy physiology. Small amount of pneumobilia, similar to the prior study, presumably indicative of prior sphincterotomy. Pancreas: No definite pancreatic mass or peripancreatic fluid collections or inflammatory changes are noted on today's noncontrast CT examination. Spleen:  Unremarkable. Adrenals/Urinary Tract: No calcifications are identified within the collecting system of either kidney, along the course of either ureter, or within the lumen of the urinary bladder. No hydroureteronephrosis or perinephric stranding. Bilateral kidneys and adrenal glands are normal in appearance. Unenhanced appearance of the urinary bladder is normal. Stomach/Bowel: Unenhanced appearance of the stomach is normal. There is no pathologic dilatation of small bowel or colon. The appendix is not confidently identified and may be surgically absent. Regardless, there are no inflammatory changes noted adjacent to the cecum to suggest the presence of an acute appendicitis at this time. Vascular/Lymphatic: Aortic atherosclerosis. No lymphadenopathy noted in the abdomen or pelvis. Reproductive: Prostate gland and seminal vesicles are unremarkable in appearance. Other: No significant volume of ascites.  No pneumoperitoneum. Musculoskeletal: There are no aggressive appearing lytic or blastic lesions noted in the visualized portions of the skeleton. IMPRESSION: 1. No acute findings are noted in the abdomen or pelvis to account for the patient's symptoms. 2. Hepatic steatosis. 3. Aortic atherosclerosis. 4. Additional incidental findings, similar to prior studies, as above. Electronically Signed   By: Vinnie Langton M.D.   On: 03/03/2021 07:26    ROS:  As stated above in the HPI otherwise  negative.  Blood pressure (!) 172/98, pulse 86, temperature 100 F (37.8 C), temperature source Oral, resp. rate 12, height 6\' 1"  (1.854 m), weight 92.9 kg, SpO2 97 %.    PE: Gen: NAD, Alert and Oriented HEENT:  [place]/AT, EOMI Neck: Supple, no LAD Lungs: CTA Bilaterally CV: RRR without M/G/R ABD: Soft, tender in the RUQ, +BS Ext: No C/C/E  Assessment/Plan: 1) Probable choledocholithiasis. 2) RUQ abdominal pain. 3) Obstructive liver enzyme pattern.   The patient remained hemodynamically stable overnight.  His WBC did decrease.  An ERCP will be performed today.  Plan: 1) ERCP with stone extraction.  Elener Custodio D 03/04/2021, 1:24 PM

## 2021-03-04 NOTE — Anesthesia Postprocedure Evaluation (Signed)
Anesthesia Post Note  Patient: [name]  Procedure(s) Performed: ENDOSCOPIC RETROGRADE CHOLANGIOPANCREATOGRAPHY (ERCP) SPHINCTEROTOMY REMOVAL OF STONES     Patient location during evaluation: PACU Anesthesia Type: General Level of consciousness: sedated Pain management: pain level controlled Vital Signs Assessment: post-procedure vital signs reviewed and stable Respiratory status: spontaneous breathing and respiratory function stable Cardiovascular status: stable Postop Assessment: no apparent nausea or vomiting Anesthetic complications: no   No notable events documented.  Last Vitals:  Vitals:   03/04/21 1432 03/04/21 1445  BP:  (!) 141/90  Pulse: 86 86  Resp: 19 11  Temp:    SpO2: 100% 99%    Last Pain:  Vitals:   03/04/21 1445  TempSrc:   PainSc: 2                  Jasminemarie Sherrard,Leland DANIEL

## 2021-03-04 NOTE — Anesthesia Procedure Notes (Signed)
Procedure Name: Intubation Date/Time: 03/04/2021 1:24 PM Performed by: Milford Cage, CRNA Pre-anesthesia Checklist: Patient identified, Emergency Drugs available, Suction available and Patient being monitored Patient Re-evaluated:Patient Re-evaluated prior to induction Oxygen Delivery Method: Circle system utilized Preoxygenation: Pre-oxygenation with 100% oxygen Induction Type: IV induction and Rapid sequence Laryngoscope Size: Miller and 2 Grade View: Grade I Tube type: Oral Tube size: 7.0 mm Number of attempts: 1 Airway Equipment and Method: Stylet Placement Confirmation: ETT inserted through vocal cords under direct vision, positive ETCO2 and breath sounds checked- equal and bilateral Secured at: 23 cm Tube secured with: Tape Dental Injury: Teeth and Oropharynx as per pre-operative assessment

## 2021-03-04 NOTE — Progress Notes (Signed)
Pt  has returned from ERCP. Alert and oriented.

## 2021-03-04 NOTE — Transfer of Care (Signed)
Immediate Anesthesia Transfer of Care Note  Patient: [name]  Procedure(s) Performed: ENDOSCOPIC RETROGRADE CHOLANGIOPANCREATOGRAPHY (ERCP) SPHINCTEROTOMY REMOVAL OF STONES  Patient Location: PACU  Anesthesia Type:General  Level of Consciousness: awake  Airway & Oxygen Therapy: Patient Spontanous Breathing and Patient connected to face mask oxygen  Post-op Assessment: Report given to RN and Post -op Vital signs reviewed and stable  Post vital signs: Reviewed and stable  Last Vitals:  Vitals Value Taken Time  BP    Temp    Pulse    Resp    SpO2      Last Pain:  Vitals:   03/04/21 1201  TempSrc: Oral  PainSc: 7       Patients Stated Pain Goal: 0 (69/24/93 2419)  Complications: No notable events documented.

## 2021-03-04 NOTE — Progress Notes (Signed)
PROGRESS NOTE  [name]  DOB: August 03, 1975  PCP: [place], Betty G, MD WSF:[id number]  DOA: 03/03/2021  LOS: 0 days  Hospital Day: 2   Chief Complaint  Patient presents with   Abdominal Pain    Brief narrative: [name] is a 45 y.o. male with PMH significant for chronic abdominal pain, chronic pancreatitis, diverticulosis, nephrolithiasis, hypertension, drug-seeking behavior. Patient presented to ED on 10/7 with complaint of abdominal pain with vomiting for more than 24 hours, symptoms similar to his previous episode of pancreatitis.   While in the ED, he had a fever of 102.5, tachycardic of 210, blood pressure was elevated upto 170, breathing on room air. Labs with WBC count at 13.2, hemoglobin, platelets normal, BUN/creatinine normal, lipase level slightly elevated to 53.  AST/ALT/alk phos/total bilirubin all elevated to 512/505/173/4.3.  Lactic acid level normal. CT abdomen pelvis did not show any acute findings except for hepatic steatosis. Patient was transferred to hospitalist service at [facility] for further evaluation management. See below for details  Subjective: Patient was seen and examined this morning.  Pleasant young Caucasian male. Lying down in bed.  Partially controlled pain.  Remains n.p.o. for ERCP this morning Last episode of fever was 103.5 at 9:30 last night.  Assessment/Plan: Sepsis - POA -Unclear source.  Need to rule out cholangitis. -On admission, patient was febrile up to 102.5, tachycardic with leukocytosis and elevated liver enzymes including alk phos and total and direct bilirubin level. -Blood cultures sent from ED. was empirically started on IV Zosyn. -Lactic acid level normal.  Continue IV Zosyn.  Follow-up blood culture report.  -No fever since last night.  WBC count better this morning.  Continue to trend WBC and fever. Recent Labs  Lab 03/03/21 0345 03/03/21 0845 03/04/21 0548  WBC 13.2*  --  10.6*  LATICACIDVEN  --  1.3  --      Elevated liver enzymes Obstructive jaundice -Known case of hepatic steatosis with mild baseline elevation of liver enzymes but currently patient has significant elevated liver enzymes level including alk phos and total and direct bilirubin -Acute hepatitis panel pending -GI consult appreciated.  Noted plan of ERCP this morning.  Remains n.p.o. for that. Recent Labs  Lab 03/03/21 0345 03/04/21 0548  AST 512* 242*  ALT 505* 443*  ALKPHOS 173* 205*  BILITOT 4.3* 5.9*  PROT 7.8 7.1  ALBUMIN 4.3 3.8   Hepatitis Latest Ref Rng & Units 03/03/2021  Hep B Surface Ag NON REACTIVE NON REACTIVE  Hep B IgM NON REACTIVE NON REACTIVE  Hep C Ab NON REACTIVE PENDING  Hep A IgM NON REACTIVE NON REACTIVE    Acute on chronic abdominal pain Reported history of drug-seeking behavior -Chronic pain likely related to chronic pancreatitis, nephrolithiasis, diverticulosis. -At home patient was on oxycodone as needed. -Currently patient has a true reason of pain.  I would keep him on IV Dilaudid as needed for severe pain and continue oral oxycodone as needed for moderate pain. -Patient is aware that the plan is for short-term and medicines need to be tapered down as early as possible.  Intractable nausea, vomiting and abdominal pain -Unclear etiology.  Symptomatic management with antiemetics, IV fluid.   Hypertension -Home meds include metoprolol, amlodipine, benazepril. -Currently on metoprolol and amlodipine.  Benazepril on hold.  Continue to monitor blood pressure.  Hydralazine IV as needed   Chronic pancreatitis -Only mildly increased lipase level.  CT abdomen with no evidence of acute pancreatitis Recent Labs  Lab 03/03/21  0345  LIPASE 53*   Mobility: Encourage ambulation Code Status:   Code Status: Full Code  Nutritional status: Body mass index is 27.02 kg/m.     Diet:  Diet Order             Diet NPO time specified  Diet effective now                  DVT prophylaxis:   enoxaparin (LOVENOX) injection 40 mg Start: 03/03/21 2200 SCDs Start: 03/03/21 1006   Antimicrobials: IV Zosyn Fluid: Normal saline at 100 mill per hour Consultants: GI Family Communication: None at bedside  Status is: Observation  Remains inpatient appropriate because: Septic, pending procedure  Dispo: The patient is from: Home              Anticipated d/c is to: Home in 2 to 3 days              Patient currently is not medically stable to d/c.   Difficult to place patient No     Infusions:   sodium chloride Stopped (03/03/21 1619)   sodium chloride 100 mL/hr at 03/03/21 1907   sodium chloride     piperacillin-tazobactam (ZOSYN)  IV 3.375 g (03/04/21 0731)    Scheduled Meds:  amLODipine  5 mg Oral Daily   enoxaparin (LOVENOX) injection  40 mg Subcutaneous Q24H   metoprolol succinate  100 mg Oral Q breakfast    Antimicrobials: Anti-infectives (From admission, onward)    Start     Dose/Rate Route Frequency Ordered Stop   03/03/21 1700  piperacillin-tazobactam (ZOSYN) IVPB 3.375 g        3.375 g 12.5 mL/hr over 240 Minutes Intravenous Every 8 hours 03/03/21 0827     03/03/21 0830  piperacillin-tazobactam (ZOSYN) IVPB 3.375 g        3.375 g 100 mL/hr over 30 Minutes Intravenous Once 03/03/21 0824 03/03/21 0958       PRN meds: sodium chloride, hydrALAZINE, HYDROmorphone (DILAUDID) injection, ondansetron (ZOFRAN) IV, oxyCODONE   Objective: Vitals:   03/04/21 0524 03/04/21 0930  BP: (!) 140/99 (!) 150/92  Pulse: 95 92  Resp: 20 19  Temp: 98.4 F (36.9 C) 100.2 F (37.9 C)  SpO2: 98% 97%    Intake/Output Summary (Last 24 hours) at 03/04/2021 0959 Last data filed at 03/04/2021 0000 Gross per 24 hour  Intake 445.78 ml  Output 1200 ml  Net -754.22 ml   Filed Weights   03/03/21 0232 03/03/21 1734  Weight: 93 kg 92.9 kg   Weight change: -0.087 kg Body mass index is 27.02 kg/m.   Physical Exam: General exam: Pleasant, young Caucasian male.  Skin: No  rashes, lesions or ulcers. HEENT: Atraumatic, normocephalic, no obvious bleeding Lungs: Clear to auscultation bilaterally CVS: Regular rate and rhythm, no murmur GI/Abd soft, mild to moderate tenderness in the epigastrium present, bowel sound present CNS: Alert, awake, oriented x3 Psychiatry: Mood appropriate Extremities: No pedal edema, no calf tenderness  Data Review: I have personally reviewed the laboratory data and studies available.  Recent Labs  Lab 03/03/21 0345 03/04/21 0548  WBC 13.2* 10.6*  NEUTROABS 9.8*  --   HGB 15.8 13.7  HCT 44.1 39.3  MCV 83.8 86.0  PLT 216 139*   Recent Labs  Lab 03/03/21 0345 03/04/21 0548  NA 137 135  K 3.5 3.4*  CL 107 103  CO2 21* 22  GLUCOSE 122* 123*  BUN 15 8  CREATININE 0.85 0.61  CALCIUM 9.5 8.4*  F/u labs ordered Unresulted Labs (From admission, onward)     Start     Ordered   03/05/21 0500  Lipase, blood  Daily,   R      03/04/21 0958   03/04/21 0959  Lipase, blood  Add-on,   AD        03/04/21 0958   03/04/21 0500  HIV Antibody (routine testing w rflx)  (HIV Antibody (Routine testing w reflex) panel)  Tomorrow morning,   R        03/03/21 1006   03/04/21 0500  CBC  Daily,   R      03/03/21 1730   03/04/21 0500  Comprehensive metabolic panel  Daily,   R      03/03/21 1730   03/04/21 0500  Hepatitis panel, acute  Tomorrow morning,   R        03/03/21 1956   03/03/21 0817  Culture, blood (routine x 2)  BLOOD CULTURE X 2,   STAT      03/03/21 0817            Signed, Terrilee Croak, MD [facility] 03/04/2021

## 2021-03-04 NOTE — Anesthesia Preprocedure Evaluation (Signed)
Anesthesia Evaluation    Reviewed: Allergy & Precautions, Patient's Chart, lab work & pertinent test results  History of Anesthesia Complications Negative for: history of anesthetic complications  Airway Mallampati: I  TM Distance: >3 FB Neck ROM: Full    Dental  (+) Edentulous Upper, Edentulous Lower   Pulmonary Current Smoker,    breath sounds clear to auscultation       Cardiovascular hypertension, Pt. on medications and Pt. on home beta blockers  Rhythm:Regular Rate:Normal     Neuro/Psych negative neurological ROS     GI/Hepatic negative GI ROS, Neg liver ROS,   Endo/Other  negative endocrine ROS  Renal/GU      Musculoskeletal negative musculoskeletal ROS (+)   Abdominal Normal abdominal exam  (+)   Peds  Hematology negative hematology ROS (+)   Anesthesia Other Findings   Reproductive/Obstetrics                             Anesthesia Physical  Anesthesia Plan  ASA: II  Anesthesia Plan: General   Post-op Pain Management:    Induction: Intravenous  PONV Risk Score and Plan: 2 and Ondansetron and Midazolam  Airway Management Planned: Oral ETT  Additional Equipment: None  Intra-op Plan:   Post-operative Plan:   Informed Consent: I have reviewed the patients History and Physical, chart, labs and discussed the procedure including the risks, benefits and alternatives for the proposed anesthesia with the patient or authorized representative who has indicated his/her understanding and acceptance.       Plan Discussed with: CRNA  Anesthesia Plan Comments:         Anesthesia Quick Evaluation

## 2021-03-04 NOTE — Progress Notes (Signed)
Pt left for ERCP.

## 2021-03-05 ENCOUNTER — Encounter (HOSPITAL_COMMUNITY): Payer: Self-pay | Admitting: Gastroenterology

## 2021-03-05 LAB — COMPREHENSIVE METABOLIC PANEL
ALT: 320 U/L — ABNORMAL HIGH (ref 0–44)
AST: 131 U/L — ABNORMAL HIGH (ref 15–41)
Albumin: 3.3 g/dL — ABNORMAL LOW (ref 3.5–5.0)
Alkaline Phosphatase: 180 U/L — ABNORMAL HIGH (ref 38–126)
Anion gap: 9 (ref 5–15)
BUN: 9 mg/dL (ref 6–20)
CO2: 22 mmol/L (ref 22–32)
Calcium: 9.1 mg/dL (ref 8.9–10.3)
Chloride: 109 mmol/L (ref 98–111)
Creatinine, Ser: 0.69 mg/dL (ref 0.61–1.24)
GFR, Estimated: >60 mL/min (ref >60)
Glucose, Bld: 99 mg/dL (ref 70–99)
Potassium: 3.8 mmol/L (ref 3.5–5.1)
Sodium: 140 mmol/L (ref 135–145)
Total Bilirubin: 1.9 mg/dL — ABNORMAL HIGH (ref 0.3–1.2)
Total Protein: 6.6 g/dL (ref 6.5–8.1)

## 2021-03-05 LAB — CBC
HCT: 38.6 % — ABNORMAL LOW (ref 39.0–52.0)
Hemoglobin: 13.3 g/dL (ref 13.0–17.0)
MCH: 29.7 pg (ref 26.0–34.0)
MCHC: 34.5 g/dL (ref 30.0–36.0)
MCV: 86.2 fL (ref 80.0–100.0)
Platelets: 134 10*3/uL — ABNORMAL LOW (ref 150–400)
RBC: 4.48 MIL/uL (ref 4.22–5.81)
RDW: 13.3 % (ref 11.5–15.5)
WBC: 8.8 10*3/uL (ref 4.0–10.5)
nRBC: 0 % (ref 0.0–0.2)

## 2021-03-05 LAB — LIPASE, BLOOD: Lipase: 84 U/L — ABNORMAL HIGH (ref 11–51)

## 2021-03-05 MED ORDER — OXYCODONE HCL 5 MG PO TABS
10.0000 mg | ORAL_TABLET | Freq: Four times a day (QID) | ORAL | Status: DC | PRN
Start: 2021-03-05 — End: 2021-03-07
  Administered 2021-03-05 – 2021-03-06 (×5): 10 mg via ORAL
  Filled 2021-03-05 (×6): qty 2

## 2021-03-05 NOTE — Progress Notes (Signed)
Subjective: Feeling better, but he still has some pain in the RUQ and there is some radiation to his back.  Objective: Vital signs in last 24 hours: Temp:  [99.6 F (37.6 C)-100.5 F (38.1 C)] 100.1 F (37.8 C) (10/08 2033) Pulse Rate:  [85-92] 89 (10/08 2033) Resp:  [11-20] 20 (10/08 2033) BP: (113-172)/(78-98) 144/88 (10/08 2033) SpO2:  [95 %-100 %] 95 % (10/08 2033)    Intake/Output from previous day: 10/08 0701 - 10/09 0700 In: 2478.9 [P.O.:240; I.V.:2130.5; IV Piggyback:108.4] Out: 3225 [Urine:3225] Intake/Output this shift: Total I/O In: 1178.9 [P.O.:240; I.V.:830.5; IV Piggyback:108.4] Out: 1050 [Urine:1050]  General appearance: alert and no distress GI: soft, non-tender; bowel sounds normal; no masses,  no organomegaly  Lab Results: Recent Labs    03/03/21 0345 03/04/21 0548 03/05/21 0539  WBC 13.2* 10.6* 8.8  HGB 15.8 13.7 13.3  HCT 44.1 39.3 38.6*  PLT 216 139* 134*   BMET Recent Labs    03/03/21 0345 03/04/21 0548 03/05/21 0539  NA 137 135 140  K 3.5 3.4* 3.8  CL 107 103 109  CO2 21* 22 22  GLUCOSE 122* 123* 99  BUN 15 8 9   CREATININE 0.85 0.61 0.69  CALCIUM 9.5 8.4* 9.1   LFT Recent Labs    03/04/21 0548 03/05/21 0539  PROT 7.1 6.6  ALBUMIN 3.8 3.3*  AST 242* 131*  ALT 443* 320*  ALKPHOS 205* 180*  BILITOT 5.9* 1.9*  BILIDIR 4.1*  --    PT/INR No results for input(s): LABPROT, INR in the last 72 hours. Hepatitis Panel Recent Labs    03/03/21 0507  HEPBSAG NON REACTIVE  HCVAB PENDING  HEPAIGM NON REACTIVE  HEPBIGM NON REACTIVE   C-Diff No results for input(s): CDIFFTOX in the last 72 hours. Fecal Lactopherrin No results for input(s): FECLLACTOFRN in the last 72 hours.  Studies/Results: CT ABDOMEN PELVIS WO CONTRAST  Result Date: 03/03/2021 CLINICAL DATA:  45 year old male with history of elevated liver function tests. Abdominal pain and vomiting. EXAM: CT ABDOMEN AND PELVIS WITHOUT CONTRAST TECHNIQUE: Multidetector CT  imaging of the abdomen and pelvis was performed following the standard protocol without IV contrast. COMPARISON:  CT the abdomen and pelvis 12/25/2019. FINDINGS: Lower chest: Unremarkable. Hepatobiliary: No suspicious cystic or solid hepatic lesions. No definite suspicious cystic or solid hepatic lesions are confidently identified on today's noncontrast CT examination. Mild diffuse low attenuation throughout the hepatic parenchyma, indicative of a background of hepatic steatosis. Status post cholecystectomy. Mild intra and extrahepatic biliary ductal dilatation, similar to the prior study, likely reflective of benign post cholecystectomy physiology. Small amount of pneumobilia, similar to the prior study, presumably indicative of prior sphincterotomy. Pancreas: No definite pancreatic mass or peripancreatic fluid collections or inflammatory changes are noted on today's noncontrast CT examination. Spleen: Unremarkable. Adrenals/Urinary Tract: No calcifications are identified within the collecting system of either kidney, along the course of either ureter, or within the lumen of the urinary bladder. No hydroureteronephrosis or perinephric stranding. Bilateral kidneys and adrenal glands are normal in appearance. Unenhanced appearance of the urinary bladder is normal. Stomach/Bowel: Unenhanced appearance of the stomach is normal. There is no pathologic dilatation of small bowel or colon. The appendix is not confidently identified and may be surgically absent. Regardless, there are no inflammatory changes noted adjacent to the cecum to suggest the presence of an acute appendicitis at this time. Vascular/Lymphatic: Aortic atherosclerosis. No lymphadenopathy noted in the abdomen or pelvis. Reproductive: Prostate gland and seminal vesicles are unremarkable in appearance. Other: No  significant volume of ascites.  No pneumoperitoneum. Musculoskeletal: There are no aggressive appearing lytic or blastic lesions noted in the  visualized portions of the skeleton. IMPRESSION: 1. No acute findings are noted in the abdomen or pelvis to account for the patient's symptoms. 2. Hepatic steatosis. 3. Aortic atherosclerosis. 4. Additional incidental findings, similar to prior studies, as above. Electronically Signed   By: Vinnie Langton M.Ballard.   On: 03/03/2021 07:26   DG ERCP  Result Date: 03/04/2021 CLINICAL DATA:  45 year old male with elevated LFTs EXAM: ERCP TECHNIQUE: Multiple spot images obtained with the fluoroscopic device and submitted for interpretation post-procedure. FLUOROSCOPY TIME:  Fluoroscopy Time:  7 minutes 16 seconds COMPARISON:  CT 03/03/2021 FINDINGS: Limited intraoperative fluoroscopic spot images during ERCP. Initial image demonstrates endoscope projecting over the upper abdomen with partial opacification of the extrahepatic and intrahepatic biliary ducts. Safety wire in position. Subsequently there is deployment of a retrieval balloon catheter. Surgical changes of cholecystectomy. IMPRESSION: Limited images during ERCP demonstrates deployment of a balloon retrieval catheter. Please refer to the dictated operative report for full details of intraoperative findings and procedure. Electronically Signed   By: Corrie Mckusick Ballard.O.   On: 03/04/2021 15:05    Medications: Scheduled:  amLODipine  5 mg Oral Daily   enoxaparin (LOVENOX) injection  40 mg Subcutaneous Q24H   metoprolol succinate  100 mg Oral Q breakfast   Continuous:  sodium chloride Stopped (03/03/21 1619)   sodium chloride 100 mL/hr at 03/05/21 0544   piperacillin-tazobactam (ZOSYN)  IV 3.375 g (03/04/21 2339)    Assessment/Plan: 1) Choledocholithiasis s/p ERCP with stone extraction. 2) Abnormal liver enzymes - improving. 3) Low grade fever.   It was anticipated that he was going to be back to his baseline level of pain.  He does report an improvement, but there is a lingering pain in the RUQ.  A low grade temp of 100 F was documented, but his WBC  normalized.  Again, a significant amount of stones were removed from the CBD, but the left hepatic ducts were not able to be accessed.  However, there was no obvious retention of stones in those ducts.  In retrospect, his multiple ER visits over this past several months were most likely from the build of stones in his CBD.  Plan: 1) Continue with Zosyn. 2) Monitor for another day in the hospital as he has a low grade temp. 3) Upon discharge he needs to follow up with Dr. Henrene Pastor.  LOS: 1 day   [name] 03/05/2021, 6:57 AM

## 2021-03-05 NOTE — Plan of Care (Signed)
  Problem: Education: Goal: Knowledge of General Education information will improve Description: Including pain rating scale, medication(s)/side effects and non-pharmacologic comfort measures Outcome: Progressing   Problem: Clinical Measurements: Goal: Will remain free from infection Outcome: Progressing   Problem: Nutrition: Goal: Adequate nutrition will be maintained Outcome: Progressing   Problem: Pain Managment: Goal: General experience of comfort will improve Outcome: Progressing   

## 2021-03-05 NOTE — Progress Notes (Signed)
PROGRESS NOTE  [name]  DOB: 07/17/1975  PCP: [place], Betty G, MD FUX:[id number]  DOA: 03/03/2021  LOS: 1 day  Hospital Day: 3   Chief Complaint  Patient presents with   Abdominal Pain    Brief narrative: [name] is a 45 y.o. male with PMH significant for chronic abdominal pain, chronic pancreatitis, diverticulosis, nephrolithiasis, hypertension, drug-seeking behavior. Patient presented to ED on 10/7 with complaint of abdominal pain with vomiting for more than 24 hours, symptoms similar to his previous episode of pancreatitis.   While in the ED, he had a fever of 102.5, tachycardic of 210, blood pressure was elevated upto 170, breathing on room air. Labs with WBC count at 13.2, hemoglobin, platelets normal, BUN/creatinine normal, lipase level slightly elevated to 53.  AST/ALT/alk phos/total bilirubin all elevated to 512/505/173/4.3.  Lactic acid level normal. CT abdomen pelvis did not show any acute findings except for hepatic steatosis. Patient was transferred to hospitalist service at [facility] for further evaluation management. See below for details  Subjective: Patient was seen and examined this morning.   Lying on bed.  Feels better looks better but continues to complain of pain. Last episode of fever was 100.5 yesterday afternoon. After ERCP yesterday, labs this morning improved including alk phos and bilirubin.  Lipase level slightly up.  Assessment/Plan: Sepsis likely secondary to cholangitis - POA -Presented with abdominal pain, nausea, vomiting, fever, tachycardia, leukocytosis, elevated liver enzymes and bilirubin level. -Currently on IV Zosyn.  Blood culture did not show any growth. -Clinically improving after ERCP and stone retrieval yesterday. Recent Labs  Lab 03/03/21 0345 03/03/21 0845 03/04/21 0548 03/05/21 0539  WBC 13.2*  --  10.6* 8.8  LATICACIDVEN  --  1.3  --   --    Obstructive jaundice due to choledocholithiasis -Status post  ERCP with stone extraction.  Per GI note, significant amount of stones were removed from the CBD. -Liver enzymes level and bilirubin level improving. -Continue to monitor.  Diet advanced to regular Recent Labs  Lab 03/03/21 0345 03/04/21 0548 03/05/21 0539  AST 512* 242* 131*  ALT 505* 443* 320*  ALKPHOS 173* 205* 180*  BILITOT 4.3* 5.9* 1.9*  PROT 7.8 7.1 6.6  ALBUMIN 4.3 3.8 3.3*   Acute on chronic abdominal pain Reported history of drug-seeking behavior -Chronic pain likely related to chronic pancreatitis, nephrolithiasis, diverticulosis. -At home patient was on oxycodone 10 mg as needed. -Currently on as needed IV Dilaudid for severe pain and oral oxycodone 5 mg as needed for moderate pain.  I would increase oxycodone to 10 mg which is his home requirement.  Hoping to minimize use of IV Dilaudid.   Hypertension -Home meds include metoprolol, amlodipine, benazepril. -Currently on metoprolol and amlodipine.  Benazepril on hold.  Can resume post discharge.  Continue to monitor blood pressure.  Hydralazine IV as needed   Chronic pancreatitis -CT abdomen on admission did not show any evidence of acute pancreatitis.  Slightly increased lipase level today after ERCP yesterday.  Continue to monitor. Recent Labs  Lab 03/03/21 0345 03/04/21 0548 03/05/21 0539  LIPASE 53* 38 84*   Mobility: Encourage ambulation Code Status:   Code Status: Full Code  Nutritional status: Body mass index is 27.02 kg/m.     Diet:  Diet Order             Diet regular Room service appropriate? Yes; Fluid consistency: Thin  Diet effective now  DVT prophylaxis:  enoxaparin (LOVENOX) injection 40 mg Start: 03/03/21 2200 SCDs Start: 03/03/21 1006   Antimicrobials: IV Zosyn Fluid: Oral intake improved.  Can stop IV fluid. Consultants: GI Family Communication: None at bedside  Status is: Observation  Remains inpatient appropriate because: Post ERCP day 1.  Need to monitor  inpatient  Dispo: The patient is from: Home              Anticipated d/c is to: Home in 1 to 2 days.              Patient currently is not medically stable to d/c.   Difficult to place patient No     Infusions:   sodium chloride Stopped (03/03/21 1619)   piperacillin-tazobactam (ZOSYN)  IV 3.375 g (03/05/21 1791)    Scheduled Meds:  amLODipine  5 mg Oral Daily   enoxaparin (LOVENOX) injection  40 mg Subcutaneous Q24H   metoprolol succinate  100 mg Oral Q breakfast    Antimicrobials: Anti-infectives (From admission, onward)    Start     Dose/Rate Route Frequency Ordered Stop   03/03/21 1700  piperacillin-tazobactam (ZOSYN) IVPB 3.375 g        3.375 g 12.5 mL/hr over 240 Minutes Intravenous Every 8 hours 03/03/21 0827     03/03/21 0830  piperacillin-tazobactam (ZOSYN) IVPB 3.375 g        3.375 g 100 mL/hr over 30 Minutes Intravenous Once 03/03/21 0824 03/03/21 0958       PRN meds: sodium chloride, amisulpride, fentaNYL (SUBLIMAZE) injection, hydrALAZINE, HYDROmorphone (DILAUDID) injection, ondansetron (ZOFRAN) IV, oxyCODONE   Objective: Vitals:   03/04/21 1512 03/04/21 2033  BP:  (!) 144/88  Pulse:  89  Resp:  20  Temp: 100 F (37.8 C) 100.1 F (37.8 C)  SpO2:  95%    Intake/Output Summary (Last 24 hours) at 03/05/2021 1040 Last data filed at 03/05/2021 0304 Gross per 24 hour  Intake 2478.86 ml  Output 2775 ml  Net -296.14 ml   Filed Weights   03/03/21 0232 03/03/21 1734  Weight: 93 kg 92.9 kg   Weight change:  Body mass index is 27.02 kg/m.   Physical Exam: General exam: Pleasant, young Caucasian male.  Mild to moderate abdominal pain. Skin: No rashes, lesions or ulcers. HEENT: Atraumatic, normocephalic, no obvious bleeding Lungs: Clear to auscultation bilaterally CVS: Regular rate and rhythm, no murmur GI/Abd soft, mild to moderate tenderness in the epigastrium present, bowel sound present CNS: Alert,, awake, oriented x3 Psychiatry: Mood  appropriate Extremities: No pedal edema, no calf tenderness  Data Review: I have personally reviewed the laboratory data and studies available.  Recent Labs  Lab 03/03/21 0345 03/04/21 0548 03/05/21 0539  WBC 13.2* 10.6* 8.8  NEUTROABS 9.8*  --   --   HGB 15.8 13.7 13.3  HCT 44.1 39.3 38.6*  MCV 83.8 86.0 86.2  PLT 216 139* 134*   Recent Labs  Lab 03/03/21 0345 03/04/21 0548 03/05/21 0539  NA 137 135 140  K 3.5 3.4* 3.8  CL 107 103 109  CO2 21* 22 22  GLUCOSE 122* 123* 99  BUN $Re'15 8 9  'Chs$ CREATININE 0.85 0.61 0.69  CALCIUM 9.5 8.4* 9.1    F/u labs ordered Unresulted Labs (From admission, onward)     Start     Ordered   03/05/21 0500  Lipase, blood  Daily,   R      03/04/21 0958   03/04/21 0500  CBC  Daily,   R  03/03/21 1730   03/04/21 0500  Comprehensive metabolic panel  Daily,   R      03/03/21 1730   03/04/21 0500  Hepatitis panel, acute  Tomorrow morning,   R        03/03/21 1956            Signed, Terrilee Croak, MD [facility] 03/05/2021

## 2021-03-06 ENCOUNTER — Telehealth: Payer: Self-pay | Admitting: Nurse Practitioner

## 2021-03-06 DIAGNOSIS — R7989 Other specified abnormal findings of blood chemistry: Secondary | ICD-10-CM

## 2021-03-06 DIAGNOSIS — K8050 Calculus of bile duct without cholangitis or cholecystitis without obstruction: Secondary | ICD-10-CM

## 2021-03-06 DIAGNOSIS — R1010 Upper abdominal pain, unspecified: Secondary | ICD-10-CM

## 2021-03-06 LAB — COMPREHENSIVE METABOLIC PANEL
ALT: 248 U/L — ABNORMAL HIGH (ref 0–44)
AST: 67 U/L — ABNORMAL HIGH (ref 15–41)
Albumin: 3.4 g/dL — ABNORMAL LOW (ref 3.5–5.0)
Alkaline Phosphatase: 176 U/L — ABNORMAL HIGH (ref 38–126)
Anion gap: 7 (ref 5–15)
BUN: 17 mg/dL (ref 6–20)
CO2: 27 mmol/L (ref 22–32)
Calcium: 9 mg/dL (ref 8.9–10.3)
Chloride: 102 mmol/L (ref 98–111)
Creatinine, Ser: 0.95 mg/dL (ref 0.61–1.24)
GFR, Estimated: >60 mL/min (ref >60)
Glucose, Bld: 102 mg/dL — ABNORMAL HIGH (ref 70–99)
Potassium: 3.4 mmol/L — ABNORMAL LOW (ref 3.5–5.1)
Sodium: 136 mmol/L (ref 135–145)
Total Bilirubin: 1.5 mg/dL — ABNORMAL HIGH (ref 0.3–1.2)
Total Protein: 6.9 g/dL (ref 6.5–8.1)

## 2021-03-06 LAB — CBC
HCT: 40 % (ref 39.0–52.0)
Hemoglobin: 13.9 g/dL (ref 13.0–17.0)
MCH: 29.9 pg (ref 26.0–34.0)
MCHC: 34.8 g/dL (ref 30.0–36.0)
MCV: 86 fL (ref 80.0–100.0)
Platelets: 142 10*3/uL — ABNORMAL LOW (ref 150–400)
RBC: 4.65 MIL/uL (ref 4.22–5.81)
RDW: 13.3 % (ref 11.5–15.5)
WBC: 8 10*3/uL (ref 4.0–10.5)
nRBC: 0 % (ref 0.0–0.2)

## 2021-03-06 LAB — LIPASE, BLOOD: Lipase: 32 U/L (ref 11–51)

## 2021-03-06 MED ORDER — AMOXICILLIN-POT CLAVULANATE 875-125 MG PO TABS: 1.0000 | ORAL_TABLET | Freq: Two times a day (BID) | ORAL | 0 refills | Status: AC

## 2021-03-06 MED ORDER — OXYCODONE HCL 5 MG PO TABS: 10.0000 mg | ORAL_TABLET | Freq: Two times a day (BID) | ORAL | 0 refills | Status: DC | PRN

## 2021-03-06 NOTE — Discharge Summary (Signed)
Physician Discharge Summary  [name] GXQ:[id number] DOB: 05-01-1976 DOA: 03/03/2021  PCP: [place], Betty G, MD  Admit date: 03/03/2021 Discharge date: 03/06/2021  Admitted From: Home Discharge disposition: Home   Code Status: Prior   Discharge Diagnosis:   Principal Problem:   Acute cholangitis    Chief Complaint  Patient presents with   Abdominal Pain    Brief narrative: [name] is a 45 y.o. male with PMH significant for chronic abdominal pain, chronic pancreatitis, diverticulosis, nephrolithiasis, hypertension, drug-seeking behavior. Patient presented to ED on 10/7 with complaint of abdominal pain with vomiting for more than 24 hours, symptoms similar to his previous episode of pancreatitis.   While in the ED, he had a fever of 102.5, tachycardic of 210, blood pressure was elevated upto 170, breathing on room air. Labs with WBC count at 13.2, hemoglobin, platelets normal, BUN/creatinine normal, lipase level slightly elevated to 53.  AST/ALT/alk phos/total bilirubin all elevated to 512/505/173/4.3.  Lactic acid level normal. CT abdomen pelvis did not show any acute findings except for hepatic steatosis. Patient was transferred to hospitalist service at [facility] for further evaluation management. See below for details  Subjective: Patient was seen and examined this morning.   Lying on bed. Not in distress.  No fever last 24 hours.  Lipase level improving.  Liver enzymes including alk phos and bilirubin level improving.    Assessment/Plan: Sepsis secondary to cholangitis - POA -Presented with abdominal pain, nausea, vomiting, fever, tachycardia, leukocytosis, elevated liver enzymes and bilirubin level. -Clinically improving after ERCP and stone retrieval on 10/8. -Currently on IV Zosyn.  Plan to discharge home on 3 more days of oral Augmentin.Marland Kitchen Recent Labs  Lab 03/03/21 0345 03/03/21 0845 03/04/21 0548 03/05/21 0539 03/06/21 0516  WBC 13.2*  --   10.6* 8.8 8.0  LATICACIDVEN  --  1.3  --   --   --    Obstructive jaundice due to choledocholithiasis -Status post ERCP with stone extraction.  Per GI note, significant amount of stones were removed from the CBD. -Liver enzymes level including alk phos, bilirubin level improved.  Post ERCP, lipid was slightly elevated which improved as well today.  Tolerating regular diet. -Okay to discharge today.  Follow-up with GI as an outpatient. Recent Labs  Lab 03/03/21 0345 03/04/21 0548 03/05/21 0539 03/06/21 0516  AST 512* 242* 131* 67*  ALT 505* 443* 320* 248*  ALKPHOS 173* 205* 180* 176*  BILITOT 4.3* 5.9* 1.9* 1.5*  PROT 7.8 7.1 6.6 6.9  ALBUMIN 4.3 3.8 3.3* 3.4*   Acute on chronic abdominal pain Reported history of drug-seeking behavior -Chronic pain likely related to chronic pancreatitis, nephrolithiasis, diverticulosis. -At home patient was on oxycodone 10 mg as needed. -Discharged with the prescription for few days of oxycodone 5 mg.    Hypertension -Home meds include metoprolol, amlodipine, benazepril. -Continue the same post discharge.  Chronic pancreatitis -CT abdomen on admission did not show any evidence of acute pancreatitis. Recent Labs  Lab 03/03/21 0345 03/04/21 0548 03/05/21 0539 03/06/21 0516  LIPASE 53* 38 84* 32    Allergies as of 03/06/2021       Reactions   Cortisone Other (See Comments)   drops potassium level "bottoms out" potassium level drops potassium level Bottoms out potassium "bottoms out" potassium level Other reaction(s): Other (See Comments) Bottoms out potassium   Eggs Or Egg-derived Products Hives, Other (See Comments)   Rash Rash   Ketorolac Hives, Other (See Comments)   Hives, slightly labored  breathing Hives, slightly labored breathing   Ketorolac Tromethamine Hives, Shortness Of Breath   Haldol [haloperidol Lactate] Other (See Comments)   "jittery"   Haloperidol Other (See Comments)   "jittery" "jittery"   Hydrocortisone  Hives   Also drops potassium level   Iodinated Diagnostic Agents Nausea And Vomiting, Other (See Comments)   Hives Hives Hives Hives   Ketorolac Tromethamine Hives   Reglan [metoclopramide]    Face "draws" and gets figgety   Compazine [prochlorperazine] Other (See Comments)   Dystonic reaction        Medication List     TAKE these medications    amLODipine-benazepril 5-20 MG capsule Commonly known as: LOTREL TAKE 1 CAPSULE BY MOUTH EVERY DAY What changed:  how much to take how to take this when to take this additional instructions   amoxicillin-clavulanate 875-125 MG tablet Commonly known as: Augmentin Take 1 tablet by mouth every 12 (twelve) hours for 10 days.   fluticasone 50 MCG/ACT nasal spray Commonly known as: Flonase Place 1 spray into both nostrils 2 (two) times daily. What changed:  when to take this reasons to take this   metoprolol succinate 100 MG 24 hr tablet Commonly known as: TOPROL-XL TAKE 1 TABLET (100 MG TOTAL) BY MOUTH DAILY. TAKE WITH OR IMMEDIATELY FOLLOWING A MEAL.   naloxone 4 MG/0.1ML Liqd nasal spray kit Commonly known as: NARCAN 1 spray in each nostril x 1 if opioid overdose. What changed:  how much to take how to take this when to take this reasons to take this   ondansetron 8 MG disintegrating tablet Commonly known as: Zofran ODT Take 1 tablet (8 mg total) by mouth every 8 (eight) hours as needed for nausea or vomiting.   oxyCODONE 5 MG immediate release tablet Commonly known as: Oxy IR/ROXICODONE Take 2 tablets (10 mg total) by mouth every 12 (twelve) hours as needed for up to 5 days for moderate pain. What changed:  medication strength when to take this reasons to take this   pantoprazole 40 MG tablet Commonly known as: PROTONIX Take 1 tablet (40 mg total) by mouth daily. What changed:  when to take this reasons to take this   promethazine 25 MG tablet Commonly known as: PHENERGAN TAKE 1 TABLET BY MOUTH EVERY 12  HOURS AS NEEDED What changed: reasons to take this        Discharge Instructions:  Diet Recommendation: Cardiac diet  Follow ups:    Follow-up [facility] Follow up.   Contact information: [place] [id number] [facility] Follow up.   Specialty: Gastroenterology Contact information: [place] [address] [phone]                Wound care:     Discharge Exam:   Vitals:   03/05/21 1613 03/05/21 2043 03/06/21 0527 03/06/21 1412  BP: (!) 152/97 (!) 144/97 (!) 147/99 (!) 150/102  Pulse: 65 69 (!) 58 61  Resp: _0 Temp: 98.9 F (37.2 C) 98 F (36.7 C) 98.1 F (36.7 C) 98.9 F (37.2 C)  TempSrc: Oral Oral Oral Oral  SpO2: 97% 95% 94% 96%  Weight:      Height:        Body mass index is 27.02 kg/m.  General exam: Pleasant, young Caucasian male.  Pain controlled Skin: No rashes, lesions or ulcers.  HEENT: Atraumatic, normocephalic, no obvious bleeding Lungs: Clear to auscultation bilaterally CVS: Regular rate and rhythm, no murmur GI/Abd soft, improved tenderness, bowel sound present CNS: Alert, awake, oriented x3 Psychiatry: Mood appropriate Extremities: No pedal edema, no calf tenderness  Time coordinating discharge: 35 minutes   The results of significant diagnostics from this hospitalization (including imaging, microbiology, ancillary and laboratory) are listed below for reference.    Procedures and Diagnostic Studies:   CT ABDOMEN PELVIS WO CONTRAST  Result Date: 03/03/2021 CLINICAL DATA:  45 year old male with history of elevated liver function tests. Abdominal pain and vomiting. EXAM: CT ABDOMEN AND PELVIS WITHOUT CONTRAST TECHNIQUE: Multidetector CT imaging of the abdomen and pelvis was performed following the standard protocol without IV contrast. COMPARISON:  CT the abdomen and  pelvis 12/25/2019. FINDINGS: Lower chest: Unremarkable. Hepatobiliary: No suspicious cystic or solid hepatic lesions. No definite suspicious cystic or solid hepatic lesions are confidently identified on today's noncontrast CT examination. Mild diffuse low attenuation throughout the hepatic parenchyma, indicative of a background of hepatic steatosis. Status post cholecystectomy. Mild intra and extrahepatic biliary ductal dilatation, similar to the prior study, likely reflective of benign post cholecystectomy physiology. Small amount of pneumobilia, similar to the prior study, presumably indicative of prior sphincterotomy. Pancreas: No definite pancreatic mass or peripancreatic fluid collections or inflammatory changes are noted on today's noncontrast CT examination. Spleen: Unremarkable. Adrenals/Urinary Tract: No calcifications are identified within the collecting system of either kidney, along the course of either ureter, or within the lumen of the urinary bladder. No hydroureteronephrosis or perinephric stranding. Bilateral kidneys and adrenal glands are normal in appearance. Unenhanced appearance of the urinary bladder is normal. Stomach/Bowel: Unenhanced appearance of the stomach is normal. There is no pathologic dilatation of small bowel or colon. The appendix is not confidently identified and may be surgically absent. Regardless, there are no inflammatory changes noted adjacent to the cecum to suggest the presence of an acute appendicitis at this time. Vascular/Lymphatic: Aortic atherosclerosis. No lymphadenopathy noted in the abdomen or pelvis. Reproductive: Prostate gland and seminal vesicles are unremarkable in appearance. Other: No significant volume of ascites.  No pneumoperitoneum. Musculoskeletal: There are no aggressive appearing lytic or blastic lesions noted in the visualized portions of the skeleton. IMPRESSION: 1. No acute findings are noted in the abdomen or pelvis to account for the patient's  symptoms. 2. Hepatic steatosis. 3. Aortic atherosclerosis. 4. Additional incidental findings, similar to prior studies, as above. Electronically Signed   By: Vinnie Langton M.D.   On: 03/03/2021 07:26   DG ERCP  Result Date: 03/04/2021 CLINICAL DATA:  45 year old male with elevated LFTs EXAM: ERCP TECHNIQUE: Multiple spot images obtained with the fluoroscopic device and submitted for interpretation post-procedure. FLUOROSCOPY TIME:  Fluoroscopy Time:  7 minutes 16 seconds COMPARISON:  CT 03/03/2021 FINDINGS: Limited intraoperative fluoroscopic spot images during ERCP. Initial image demonstrates endoscope projecting over the upper abdomen with partial opacification of the extrahepatic and intrahepatic biliary ducts. Safety wire in position. Subsequently there is deployment of a retrieval balloon catheter. Surgical changes of cholecystectomy. IMPRESSION: Limited images during ERCP demonstrates deployment of a balloon retrieval catheter. Please refer to the dictated operative report for full details of intraoperative findings and procedure. Electronically Signed   By: Corrie Mckusick D.O.   On: 03/04/2021 15:05     Labs:   Basic Metabolic Panel: Recent Labs  Lab 03/03/21 0345 03/04/21 0548 03/05/21 0539 03/06/21 0516  NA 137 135 140 136  K 3.5 3.4* 3.8 3.4*  CL 107  103 109 102  CO2 21* _0 GLUCOSE 122* 123* 99 102*  BUN _1 CREATININE 0.85 0.61 0.69 0.95  CALCIUM 9.5 8.4* 9.1 9.0   GFR Estimated Creatinine Clearance: 113.3 mL/min (by C-G formula based on SCr of 0.95 mg/dL). Liver Function Tests: Recent Labs  Lab 03/03/21 0345 03/04/21 0548 03/05/21 0539 03/06/21 0516  AST 512* 242* 131* 67*  ALT 505* 443* 320* 248*  ALKPHOS 173* 205* 180* 176*  BILITOT 4.3* 5.9* 1.9* 1.5*  PROT 7.8 7.1 6.6 6.9  ALBUMIN 4.3 3.8 3.3* 3.4*   Recent Labs  Lab 03/03/21 0345 03/04/21 0548 03/05/21 0539 03/06/21 0516  LIPASE 53* 38 84* 32   No results for input(s): AMMONIA in the  last 168 hours. Coagulation profile No results for input(s): INR, PROTIME in the last 168 hours.  CBC: Recent Labs  Lab 03/03/21 0345 03/04/21 0548 03/05/21 0539 03/06/21 0516  WBC 13.2* 10.6* 8.8 8.0  NEUTROABS 9.8*  --   --   --   HGB 15.8 13.7 13.3 13.9  HCT 44.1 39.3 38.6* 40.0  MCV 83.8 86.0 86.2 86.0  PLT 216 139* 134* 142*   Cardiac Enzymes: No results for input(s): CKTOTAL, CKMB, CKMBINDEX, TROPONINI in the last 168 hours. BNP: Invalid input(s): POCBNP CBG: No results for input(s): GLUCAP in the last 168 hours. D-Dimer No results for input(s): DDIMER in the last 72 hours. Hgb A1c No results for input(s): HGBA1C in the last 72 hours. Lipid Profile No results for input(s): CHOL, HDL, LDLCALC, TRIG, CHOLHDL, LDLDIRECT in the last 72 hours. Thyroid function studies No results for input(s): TSH, T4TOTAL, T3FREE, THYROIDAB in the last 72 hours.  Invalid input(s): FREET3 Anemia work up No results for input(s): VITAMINB12, FOLATE, FERRITIN, TIBC, IRON, RETICCTPCT in the last 72 hours. Microbiology Recent Results (from the past 240 hour(s))  Resp Panel by RT-PCR (Flu A&B, Covid) Nasopharyngeal Swab     Status: None   Collection Time: 03/03/21  8:37 AM   Specimen: Nasopharyngeal Swab; Nasopharyngeal(NP) swabs in vial transport medium  Result Value Ref Range Status   SARS Coronavirus 2 by RT PCR NEGATIVE NEGATIVE Final    Comment: (NOTE) SARS-CoV-2 target nucleic acids are NOT DETECTED.  The SARS-CoV-2 RNA is generally detectable in upper respiratory specimens during the acute phase of infection. The lowest concentration of SARS-CoV-2 viral copies this assay can detect is 138 copies/mL. A negative result does not preclude SARS-Cov-2 infection and should not be used as the sole basis for treatment or other patient management decisions. A negative result may occur with  improper specimen collection/handling, submission of specimen other than nasopharyngeal swab,  presence of viral mutation(s) within the areas targeted by this assay, and inadequate number of viral copies(<138 copies/mL). A negative result must be combined with clinical observations, patient history, and epidemiological information. The expected result is Negative.  Fact Sheet for Patients:  EntrepreneurPulse.com.au  Fact Sheet for Healthcare Providers:  IncredibleEmployment.be  This test is no t yet approved or cleared by the [place] FDA and  has been authorized for detection and/or diagnosis of SARS-CoV-2 by FDA under an Emergency Use Authorization (EUA). This EUA will remain  in effect (meaning this test can be used) for the duration of the COVID-19 declaration under Section 564(b)(1) of the Act, 21 U.S.C.section 360bbb-3(b)(1), unless the authorization is terminated  or revoked sooner.       Influenza A by PCR NEGATIVE NEGATIVE Final   Influenza B by  PCR NEGATIVE NEGATIVE Final    Comment: (NOTE) The Xpert Xpress SARS-CoV-2/FLU/RSV plus assay is intended as an aid in the diagnosis of influenza from Nasopharyngeal swab specimens and should not be used as a sole basis for treatment. Nasal washings and aspirates are unacceptable for Xpert Xpress SARS-CoV-2/FLU/RSV testing.  Fact Sheet for Patients: EntrepreneurPulse.com.au  Fact Sheet for Healthcare Providers: IncredibleEmployment.be  This test is not yet approved or cleared by the [place] FDA and has been authorized for detection and/or diagnosis of SARS-CoV-2 by FDA under an Emergency Use Authorization (EUA). This EUA will remain in effect (meaning this test can be used) for the duration of the COVID-19 declaration under Section 564(b)(1) of the Act, 21 U.S.C. section 360bbb-3(b)(1), unless the authorization is terminated or revoked.  Performed at [facility], [address] [place], [place] [address]   Culture,  blood (routine x 2)     Status: None (Preliminary result)   Collection Time: 03/03/21  8:45 AM   Specimen: Right Antecubital; Blood  Result Value Ref Range Status   Specimen Description   Final    RIGHT ANTECUBITAL Performed at [facility], [address] [place], [place] [address]    Special Requests   Final    BOTTLES DRAWN AEROBIC AND ANAEROBIC Blood Culture results may not be optimal due to an excessive volume of blood received in culture bottles Performed at [facility], [address] [place], [place] [address]    Culture   Final    NO GROWTH 3 DAYS Performed at [facility], [place] [address], [place], [place] [address]    Report Status PENDING  Incomplete  Culture, blood (routine x 2)     Status: None (Preliminary result)   Collection Time: 03/03/21  9:12 AM   Specimen: BLOOD RIGHT HAND  Result Value Ref Range Status   Specimen Description   Final    BLOOD RIGHT HAND Performed at [facility], [address] [place], [place] [address]    Special Requests   Final    BOTTLES DRAWN AEROBIC AND ANAEROBIC BLOOD RIGHT HAND Performed at [facility], [address] [place], [place] [address]    Culture   Final    NO GROWTH 3 DAYS Performed at [facility], [place] [address], [place], [place] [address]    Report Status PENDING  Incomplete     Signed: Marlowe Aschoff   [facility] 03/07/2021, 8:09 AM

## 2021-03-06 NOTE — Progress Notes (Addendum)
[facility] Progress Note  CC:  N/V, RUQ pain and elevated LFTs  Subjective: He had some nausea last night, no vomiting. He has mild epigastric and RUQ pain and a "catch sensation" to his mid to lower back well controlled on Oxycodone 10m Q 6 hrs.  Attempting to reduce Dilaudid use, last dose given at 8:18am. He is tolerating a regular diet. He passed a normal brown formed BM this am, no rectal bleeding or melena.   Objective:   ERCP 03/04/2021: - The prior biliary sphincteroplasty appeared open. - The minor papilla appeared normal. - Choledocholithiasis was found. Complete removal was accomplished by biliary sphincterotomy and balloon extraction. - A biliary sphincterotomy was performed. - The biliary tree was swept.  Vital signs in last 24 hours: Temp:  [98 F (36.7 C)-98.9 F (37.2 C)] 98.1 F (36.7 C) (10/10 0527) Pulse Rate:  [58-69] 58 (10/10 0527) Resp:  [16-19] 16 (10/10 0527) BP: (144-152)/(97-99) 147/99 (10/10 0527) SpO2:  [94 %-97 %] 94 % (10/10 0527) Last BM Date: 03/02/21 General:   Alert 45year old male in NAD.  Eyes: No scleral icterus.  Heart: RRR, no murmur. Pulm:  Breath sounds clear throughout. Abdomen: Soft, nondistended. Mild epigastric and RUQ tenderness without rebound or guarding.  Extremities:  Without edema. Neurologic:  Alert and  oriented x4. Grossly normal neurologically. Psych:  Alert and cooperative. Normal mood and affect.  Intake/Output from previous day: 10/09 0701 - 10/10 0700 In: 1700 [P.O.:1700] Out: 425 [Urine:425] Intake/Output this shift: No intake/output data recorded.  Lab Results: Recent Labs    03/04/21 0548 03/05/21 0539 03/06/21 0516  WBC 10.6* 8.8 8.0  HGB 13.7 13.3 13.9  HCT 39.3 38.6* 40.0  PLT 139* 134* 142*   BMET Recent Labs    03/04/21 0548 03/05/21 0539 03/06/21 0516  NA 135 140 136  K 3.4* 3.8 3.4*  CL 103 109 102  CO2 _0 GLUCOSE 123* 99 102*  BUN _1 CREATININE  0.61 0.69 0.95  CALCIUM 8.4* 9.1 9.0   LFT Recent Labs    03/04/21 0548 03/05/21 0539 03/06/21 0516  PROT 7.1   < > 6.9  ALBUMIN 3.8   < > 3.4*  AST 242*   < > 67*  ALT 443*   < > 248*  ALKPHOS 205*   < > 176*  BILITOT 5.9*   < > 1.5*  BILIDIR 4.1*  --   --    < > = values in this interval not displayed.   PT/INR No results for input(s): LABPROT, INR in the last 72 hours. Hepatitis Panel No results for input(s): HEPBSAG, HCVAB, HEPAIGM, HEPBIGM in the last 72 hours.  DG ERCP  Result Date: 03/04/2021 CLINICAL DATA:  45year old male with elevated LFTs EXAM: ERCP TECHNIQUE: Multiple spot images obtained with the fluoroscopic device and submitted for interpretation post-procedure. FLUOROSCOPY TIME:  Fluoroscopy Time:  7 minutes 16 seconds COMPARISON:  CT 03/03/2021 FINDINGS: Limited intraoperative fluoroscopic spot images during ERCP. Initial image demonstrates endoscope projecting over the upper abdomen with partial opacification of the extrahepatic and intrahepatic biliary ducts. Safety wire in position. Subsequently there is deployment of a retrieval balloon catheter. Surgical changes of cholecystectomy. IMPRESSION: Limited images during ERCP demonstrates deployment of a balloon retrieval catheter. Please refer to the dictated operative report for full details of intraoperative findings and procedure. Electronically Signed   By: JCorrie MckusickD.O.   On: 03/04/2021 15:05    Assessment /  Plan:  80) 45 year old male with a PMH of choledocholithiasis s/p ERCP on 12/30/2019, s/p cholecystectomy for gallstones, chronic pancreatitis  admitted to the hospital 03/03/2021 with N/V, RUQ pain, elevated LFTs and fever.  CTAP without contrast 10/7 showed evidence of mild intra/extrahepatic biliary duct dilatation c/w post cholecystectomy and a small amount of pneumobilia presumably likely due to past sphincterotomy. S/P ERCP by Dr. Benson Norway 03/04/2021 which identified choledocholithiasis s/p sphincterotomy  with stone extraction.Today Alk phos 189 -> 176. AST 131 -> 67. ALT 320 -> 248. T. Bili 1.9 -> 1.5. Lipase 32. WBC 8.0. Blood cx negative after 48 hrs. On Zosyn. He is afebrile. He had mild lingering RUQ pain post ERCP which has significantly improved. Minor nausea without vomiting.  -He will likely be ok to discharge home today, await further recommendations per Dr. Henrene Pastor -Repeat CBC and hepatic panel in 1 week ( our office will make arrangements). Follow up in GI clinic with Dr. Henrene Pastor on Wed Nov. 9, 2022 at 2:40pm -Pain management per the hospitalist  -Continue Zosyn IV next dose this am then transition to oral  antibiotic x 3-6 days to complete 7-10 day course  -Ondansetron 1m po one Q 6 hrs PRN, he will need RX at time of discharge   2) Thrombocytopenia. PLT 142.   3) Hepatic steatosis  -Further management as outpatient   Principal Problem:   Acute cholangitis     LOS: 2 days   CNoralyn Pick 03/06/2021, 08:44 AM  GI ATTENDING  Interval history data reviewed.  ERCP report reviewed.  Agree with interval progress note as outlined above patient has had recurrent choledocholithiasis postcholecystectomy with cholangitis/bacteremia.  His bile duct has been cleared.  I agree with plans for antibiotic therapy and outpatient follow-up as noted above.  He may benefit from ursodiol to reduce the risk of recurrent common duct stone formation.  We will sign off.  Feel free to contact uKoreaif there are any new issues or questions.  JDocia Chuck PGeri Seminole, M.D. LDigestive Health [facility] Center LLCD[facility] of [facility]

## 2021-03-06 NOTE — Telephone Encounter (Signed)
[name], pls contact the patient and provide him with a lab order to repeat a CBC and hepatic panel in one week as he will likely be discharged from the hospital today.  He has an office follow up appointment with Dr. Henrene Pastor on  04/05/2021. Thank you

## 2021-03-06 NOTE — Plan of Care (Signed)
  Problem: Education: Goal: Knowledge of General Education information will improve Description: Including pain rating scale, medication(s)/side effects and non-pharmacologic comfort measures Outcome: Progressing   Problem: Clinical Measurements: Goal: Ability to maintain clinical measurements within normal limits will improve Outcome: Progressing   Problem: Clinical Measurements: Goal: Will remain free from infection Outcome: Progressing   Problem: Nutrition: Goal: Adequate nutrition will be maintained Outcome: Progressing   Problem: Pain Managment: Goal: General experience of comfort will improve Outcome: Progressing

## 2021-03-07 LAB — HCV INTERPRETATION

## 2021-03-08 ENCOUNTER — Encounter: Payer: Self-pay | Admitting: Family Medicine

## 2021-03-08 ENCOUNTER — Other Ambulatory Visit: Payer: Self-pay

## 2021-03-08 DIAGNOSIS — G8929 Other chronic pain: Secondary | ICD-10-CM

## 2021-03-08 DIAGNOSIS — K8050 Calculus of bile duct without cholangitis or cholecystitis without obstruction: Secondary | ICD-10-CM

## 2021-03-08 LAB — CULTURE, BLOOD (ROUTINE X 2)
Culture: NO GROWTH
Culture: NO GROWTH

## 2021-03-09 ENCOUNTER — Other Ambulatory Visit: Payer: Self-pay

## 2021-03-09 ENCOUNTER — Other Ambulatory Visit (INDEPENDENT_AMBULATORY_CARE_PROVIDER_SITE_OTHER): Payer: BLUE CROSS/BLUE SHIELD

## 2021-03-09 DIAGNOSIS — K8050 Calculus of bile duct without cholangitis or cholecystitis without obstruction: Secondary | ICD-10-CM | POA: Diagnosis not present

## 2021-03-09 LAB — HEPATIC FUNCTION PANEL
ALT: 135 U/L — ABNORMAL HIGH (ref 0–53)
AST: 44 U/L — ABNORMAL HIGH (ref 0–37)
Albumin: 4.4 g/dL (ref 3.5–5.2)
Alkaline Phosphatase: 170 U/L — ABNORMAL HIGH (ref 39–117)
Bilirubin, Direct: 0.4 mg/dL — ABNORMAL HIGH (ref 0.0–0.3)
Total Bilirubin: 1.3 mg/dL — ABNORMAL HIGH (ref 0.2–1.2)
Total Protein: 7.8 g/dL (ref 6.0–8.3)

## 2021-03-09 LAB — CBC WITH DIFFERENTIAL/PLATELET
Basophils Absolute: 0.1 10*3/uL (ref 0.0–0.1)
Basophils Relative: 1.2 % (ref 0.0–3.0)
Eosinophils Absolute: 0.6 10*3/uL (ref 0.0–0.7)
Eosinophils Relative: 5.1 % — ABNORMAL HIGH (ref 0.0–5.0)
HCT: 46.9 % (ref 39.0–52.0)
Hemoglobin: 15.7 g/dL (ref 13.0–17.0)
Lymphocytes Relative: 25.5 % (ref 12.0–46.0)
Lymphs Abs: 3 10*3/uL (ref 0.7–4.0)
MCHC: 33.5 g/dL (ref 30.0–36.0)
MCV: 87.3 fl (ref 78.0–100.0)
Monocytes Absolute: 1 10*3/uL (ref 0.1–1.0)
Monocytes Relative: 8.7 % (ref 3.0–12.0)
Neutro Abs: 7 10*3/uL (ref 1.4–7.7)
Neutrophils Relative %: 59.5 % (ref 43.0–77.0)
Platelets: 281 10*3/uL (ref 150.0–400.0)
RBC: 5.37 Mil/uL (ref 4.22–5.81)
RDW: 13.7 % (ref 11.5–15.5)
WBC: 11.8 10*3/uL — ABNORMAL HIGH (ref 4.0–10.5)

## 2021-03-09 LAB — BASIC METABOLIC PANEL
BUN: 13 mg/dL (ref 6–23)
CO2: 29 mEq/L (ref 19–32)
Calcium: 9.8 mg/dL (ref 8.4–10.5)
Chloride: 102 mEq/L (ref 96–112)
Creatinine, Ser: 0.84 mg/dL (ref 0.40–1.50)
GFR: 106.8 mL/min (ref >60.00)
Glucose, Bld: 98 mg/dL (ref 70–99)
Potassium: 3.9 mEq/L (ref 3.5–5.1)
Sodium: 138 mEq/L (ref 135–145)

## 2021-03-09 LAB — LIPASE: Lipase: 18 U/L (ref 11.0–59.0)

## 2021-03-09 MED ORDER — ONDANSETRON 4 MG PO TBDP: ORAL_TABLET | ORAL | 0 refills | Status: DC

## 2021-03-09 NOTE — Telephone Encounter (Signed)
Discussed with the patient. He agrees to this plan of care. He will come today for the lab work. Orders in [place]. Rx to [facility] as requested.

## 2021-03-09 NOTE — Telephone Encounter (Signed)
Colleen I do not know who should address this.  The patient calls with complaints of constant sharp abdominal pain in the upper middle of the abdomen into the side side and wrapping into his back. He is afebrile. Reports night sweats. Taking Oxycodone 15 mg every 12 hours. Has nausea. No vomiting. Tolerating bland diet and liquids. Please advise.

## 2021-03-09 NOTE — Telephone Encounter (Signed)
Patient called stated he was just discharged from hospital  and is still feeling a lot of pain seeking further advise.

## 2021-03-09 NOTE — Telephone Encounter (Signed)
[name], pls send patient to the lab for CBC with diff, hepatic panel, lipase and BMP now. If he does not have Ondansetron RX send in for Ondansetron 4mg  ODT one tab dissolve on tongue Q 6 to 8 hrs PRN nausea. # 20, no refills. To ER if he develops severe abdominal or chest pain. Thx

## 2021-03-10 ENCOUNTER — Telehealth: Payer: Self-pay | Admitting: Internal Medicine

## 2021-03-10 LAB — HEPATITIS PANEL, ACUTE

## 2021-03-10 NOTE — Telephone Encounter (Signed)
Pt called and stated he was expecting a call regarding his bloodwork done yesterday.  Please call with results.  Thank you.

## 2021-03-10 NOTE — Telephone Encounter (Signed)
[place], pls call patient and let him know his labs were stable. Pls schedule him for an earlier follow up appointment if his abdominal pain has not improved.  Over the weekend, if he has any severe upper abdominal/back pain or nausea or vomiting to go back to the ED.  Thank

## 2021-03-11 ENCOUNTER — Emergency Department (HOSPITAL_COMMUNITY)
Admission: EM | Admit: 2021-03-11 | Discharge: 2021-03-11 | Disposition: A | Discharge status: Home / Self Care | Payer: BLUE CROSS/BLUE SHIELD | Attending: Emergency Medicine | Admitting: Emergency Medicine

## 2021-03-11 ENCOUNTER — Emergency Department (HOSPITAL_COMMUNITY): Payer: BLUE CROSS/BLUE SHIELD

## 2021-03-11 ENCOUNTER — Other Ambulatory Visit: Payer: Self-pay

## 2021-03-11 ENCOUNTER — Encounter (HOSPITAL_COMMUNITY): Payer: Self-pay

## 2021-03-11 DIAGNOSIS — R531 Weakness: Secondary | ICD-10-CM | POA: Diagnosis not present

## 2021-03-11 DIAGNOSIS — I10 Essential (primary) hypertension: Secondary | ICD-10-CM | POA: Diagnosis not present

## 2021-03-11 DIAGNOSIS — Z79899 Other long term (current) drug therapy: Secondary | ICD-10-CM | POA: Insufficient documentation

## 2021-03-11 DIAGNOSIS — R6883 Chills (without fever): Secondary | ICD-10-CM | POA: Diagnosis not present

## 2021-03-11 DIAGNOSIS — R110 Nausea: Secondary | ICD-10-CM

## 2021-03-11 DIAGNOSIS — F17210 Nicotine dependence, cigarettes, uncomplicated: Secondary | ICD-10-CM | POA: Diagnosis not present

## 2021-03-11 DIAGNOSIS — R5383 Other fatigue: Secondary | ICD-10-CM | POA: Insufficient documentation

## 2021-03-11 DIAGNOSIS — R1084 Generalized abdominal pain: Secondary | ICD-10-CM | POA: Insufficient documentation

## 2021-03-11 DIAGNOSIS — Z9049 Acquired absence of other specified parts of digestive tract: Secondary | ICD-10-CM | POA: Diagnosis not present

## 2021-03-11 DIAGNOSIS — R112 Nausea with vomiting, unspecified: Secondary | ICD-10-CM | POA: Insufficient documentation

## 2021-03-11 DIAGNOSIS — R109 Unspecified abdominal pain: Secondary | ICD-10-CM | POA: Diagnosis not present

## 2021-03-11 DIAGNOSIS — I700 Atherosclerosis of aorta: Secondary | ICD-10-CM | POA: Diagnosis not present

## 2021-03-11 LAB — CBC WITH DIFFERENTIAL/PLATELET
Abs Immature Granulocytes: 0.34 10*3/uL — ABNORMAL HIGH (ref 0.00–0.07)
Basophils Absolute: 0.1 10*3/uL (ref 0.0–0.1)
Basophils Relative: 1 %
Eosinophils Absolute: 0.4 10*3/uL (ref 0.0–0.5)
Eosinophils Relative: 3 %
HCT: 46.4 % (ref 39.0–52.0)
Hemoglobin: 15.8 g/dL (ref 13.0–17.0)
Immature Granulocytes: 3 %
Lymphocytes Relative: 22 %
Lymphs Abs: 2.8 10*3/uL (ref 0.7–4.0)
MCH: 30.2 pg (ref 26.0–34.0)
MCHC: 34.1 g/dL (ref 30.0–36.0)
MCV: 88.5 fL (ref 80.0–100.0)
Monocytes Absolute: 1 10*3/uL (ref 0.1–1.0)
Monocytes Relative: 8 %
Neutro Abs: 8.3 10*3/uL — ABNORMAL HIGH (ref 1.7–7.7)
Neutrophils Relative %: 63 %
Platelets: 323 10*3/uL (ref 150–400)
RBC: 5.24 MIL/uL (ref 4.22–5.81)
RDW: 13.2 % (ref 11.5–15.5)
WBC: 13 10*3/uL — ABNORMAL HIGH (ref 4.0–10.5)
nRBC: 0 % (ref 0.0–0.2)

## 2021-03-11 LAB — COMPREHENSIVE METABOLIC PANEL
ALT: 93 U/L — ABNORMAL HIGH (ref 0–44)
AST: 29 U/L (ref 15–41)
Albumin: 4.3 g/dL (ref 3.5–5.0)
Alkaline Phosphatase: 146 U/L — ABNORMAL HIGH (ref 38–126)
Anion gap: 8 (ref 5–15)
BUN: 17 mg/dL (ref 6–20)
CO2: 25 mmol/L (ref 22–32)
Calcium: 9.7 mg/dL (ref 8.9–10.3)
Chloride: 103 mmol/L (ref 98–111)
Creatinine, Ser: 0.92 mg/dL (ref 0.61–1.24)
GFR, Estimated: >60 mL/min (ref >60)
Glucose, Bld: 94 mg/dL (ref 70–99)
Potassium: 4 mmol/L (ref 3.5–5.1)
Sodium: 136 mmol/L (ref 135–145)
Total Bilirubin: 1.2 mg/dL (ref 0.3–1.2)
Total Protein: 7.9 g/dL (ref 6.5–8.1)

## 2021-03-11 LAB — LIPASE, BLOOD: Lipase: 32 U/L (ref 11–51)

## 2021-03-11 MED ORDER — HYDROMORPHONE HCL 1 MG/ML IJ SOLN
1.0000 mg | Freq: Once | INTRAMUSCULAR | Status: AC
  Administered 2021-03-11: 1 mg via INTRAVENOUS
  Filled 2021-03-11: qty 1

## 2021-03-11 MED ORDER — SODIUM CHLORIDE 0.9 % IV BOLUS
1000.0000 mL | Freq: Once | INTRAVENOUS | Status: AC
Start: 2021-03-11 — End: 2021-03-11
  Administered 2021-03-11: 1000 mL via INTRAVENOUS

## 2021-03-11 MED ORDER — ONDANSETRON HCL 4 MG/2ML IJ SOLN
4.0000 mg | Freq: Once | INTRAMUSCULAR | Status: AC
  Administered 2021-03-11: 4 mg via INTRAVENOUS
  Filled 2021-03-11: qty 2

## 2021-03-11 MED ORDER — OXYCODONE HCL 5 MG PO TABS: 5.0000 mg | ORAL_TABLET | Freq: Four times a day (QID) | ORAL | 0 refills | Status: DC | PRN

## 2021-03-11 MED ORDER — MORPHINE SULFATE (PF) 4 MG/ML IV SOLN
4.0000 mg | Freq: Once | INTRAVENOUS | Status: AC
  Administered 2021-03-11: 4 mg via INTRAVENOUS
  Filled 2021-03-11: qty 1

## 2021-03-11 NOTE — Discharge Instructions (Signed)
Your work-up today has been reassuring.  Continue trying to drink plenty of fluids, since you have not had much of an appetite trying to eat smaller frequent meals.  Follow-up with your PCP this week as planned and with pain management as planned.  Return for new or worsening symptoms.

## 2021-03-11 NOTE — ED Notes (Signed)
D/c paperwork reviewed with pt, incl prescription. Pt with no questions or concerns at time of d/c. Ambulatory to ED exit to meet ride.

## 2021-03-11 NOTE — ED Triage Notes (Signed)
Pt presents with c/o weakness since Monday night. Pt reports that he was recently discharged from the hospital after being admitted with elevated liver enzymes. Pt did have an ERCP done while he was here. Pt reports nausea and abdominal pain in association with the weakness.

## 2021-03-11 NOTE — ED Provider Notes (Signed)
Emergency Medicine Provider Triage Evaluation Note  [name] , a 45 y.o. male  was evaluated in triage.  Pt complains of ongoing weakness, nausea and pain.  Patient was recently admitted for cholangitis and reports since getting home he has had very poor appetite continued right upper quadrant pain, some chills and night sweats but no documented fevers.  He reports he continues to just feel very weak and like he has no energy.  Review of Systems  Positive: Weakness, nausea, abdominal pain, chills Negative: Fever, chest pain  Physical Exam  BP 133/90 (BP Location: Left Arm)   Pulse 69   Temp 98.5 F (36.9 C) (Oral)   Resp 18   SpO2 98%  Gen:   Awake, no distress   Resp:  Normal effort  MSK:   Moves extremities without difficulty  Other:  Mild right upper quadrant tenderness  Medical Decision Making  Medically screening exam initiated at 8:57 AM.  Appropriate orders placed.  Leda Gauze was informed that the remainder of the evaluation will be completed by another provider, this initial triage assessment does not replace that evaluation, and the importance of remaining in the ED until their evaluation is complete.     Jacqlyn Larsen, PA-C 03/11/21 0900    Wyvonnia Dusky, MD 03/11/21 [phone]

## 2021-03-11 NOTE — ED Notes (Signed)
Pt did well on PO challenge. Denies nausea.

## 2021-03-11 NOTE — ED Provider Notes (Signed)
[facility] DEPT Provider Note   CSN: [id number] Arrival date & time: 03/11/21  0818     History Chief Complaint  Patient presents with   Weakness    [name] is a 45 y.o. male.  [name] is a 45 y.o. male with history of hypertension, chronic abdominal pain, pancreatitis, diverticulitis, and recent admission for cholangitis, who presents to the emergency department for evaluation of ongoing weakness, nausea and abdominal pain.  Patient reports that since he was discharged on 10/10 he has continued to feel generally weak and fatigued, he has been nauseated with some occasional vomiting, and has not appetite and has been trying to drink fluids.  He has continued to have some generalized abdominal pain primarily in his epigastric region and right upper quadrant.  Patient was recently admitted for cholangitis, had an ERCP and had multiple stone within the common bile duct that were successfully removed and then patient's symptoms improved.  He completed attics after discharge.  Reports he has had some chills but no fevers at home.  Does have underlying history of chronic abdominal pain.  The history is provided by the patient and medical records.      Past Medical History:  Diagnosis Date   Chronic abdominal pain    Diverticulitis    Drug-seeking behavior    Hypertension    Kidney stones    Pancreatitis, chronic ([place]) 05/28/2005    Patient Active Problem List   Diagnosis Date Noted   Acute cholangitis 03/03/2021   Drug-seeking behavior 11/25/2020   Diverticulitis 11/25/2020   Kidney stones 11/25/2020   Hyperlipidemia, mixed 03/01/2020   Abnormal magnetic resonance cholangiopancreatography (MRCP)    Choledocholithiasis    E coli bacteremia    Bacteremia due to Enterobacter species    Bacteremia 12/27/2019   QT prolongation 12/27/2019   Nausea without vomiting    Chronic pain disorder 08/29/2018   Back pain, chronic 08/29/2018    Nausea and vomiting in adult 02/25/2018   Hypertension, essential, benign 01/18/2018   Chronic biliary pancreatitis ([place]) 01/18/2018   Chronic abdominal pain 01/14/2018    Past Surgical History:  Procedure Laterality Date   APPENDECTOMY     CHOLECYSTECTOMY     ERCP N/A 12/30/2019   Procedure: ENDOSCOPIC RETROGRADE CHOLANGIOPANCREATOGRAPHY (ERCP);  Surgeon: Milus Banister, MD;  Location: [facility];  Service: Endoscopy;  Laterality: N/A;   ERCP N/A 03/04/2021   Procedure: ENDOSCOPIC RETROGRADE CHOLANGIOPANCREATOGRAPHY (ERCP);  Surgeon: Carol Ada, MD;  Location: [facility];  Service: Endoscopy;  Laterality: N/A;   ERCP W/ METAL STENT PLACEMENT     KIDNEY STONE SURGERY     REMOVAL OF STONES  12/30/2019   Procedure: REMOVAL OF STONES;  Surgeon: Milus Banister, MD;  Location: [facility] ENDOSCOPY;  Service: Endoscopy;;   REMOVAL OF STONES  03/04/2021   Procedure: REMOVAL OF STONES;  Surgeon: Carol Ada, MD;  Location: [facility] ENDOSCOPY;  Service: Endoscopy;;   SPHINCTEROTOMY  03/04/2021   Procedure: Joan Mayans;  Surgeon: Carol Ada, MD;  Location: [facility]OS[facility];  Service: Endoscopy;;       Family History  Problem Relation Age of Onset   Cancer Mother    Early death Mother    Hypertension Mother    Heart failure Father    Hypertension Father    Heart disease Father    Hyperlipidemia Father    Pancreatic cancer Paternal Grandmother        possibly   Pancreatic cancer Paternal 19  Colon cancer Neg Hx     Social History   Tobacco Use   Smoking status: Every Day    Packs/day: 0.50    Types: Cigarettes   Smokeless tobacco: Never  Vaping Use   Vaping Use: Never used  Substance Use Topics   Alcohol use: Not Currently    Comment: rare   Drug use: No    Home Medications Prior to Admission medications   Medication Sig Start Date End Date Taking? Authorizing Provider  amLODipine-benazepril (LOTREL) 5-20 MG capsule TAKE 1 CAPSULE BY MOUTH EVERY DAY Patient taking  differently: Take 1 capsule by mouth daily. 11/12/20   [place], Betty G, MD  amoxicillin-clavulanate (AUGMENTIN) 875-125 MG tablet Take 1 tablet by mouth every 12 (twelve) hours for 10 days. 03/06/21 03/16/21  Terrilee Croak, MD  fluticasone (FLONASE) 50 MCG/ACT nasal spray Place 1 spray into both nostrils 2 (two) times daily. Patient taking differently: Place 1 spray into both nostrils daily as needed for allergies. 04/03/19   [place], Betty G, MD  metoprolol succinate (TOPROL-XL) 100 MG 24 hr tablet TAKE 1 TABLET (100 MG TOTAL) BY MOUTH DAILY. TAKE WITH OR IMMEDIATELY FOLLOWING A MEAL. 10/25/20   [place], Betty G, MD  naloxone [facility]) nasal spray 4 mg/0.1 mL 1 spray in each nostril x 1 if opioid overdose. Patient taking differently: Place 1 spray into the nose once as needed (over dose). 1 spray in each nostril x 1 if opioid overdose. 06/08/20   [place], Betty G, MD  ondansetron (ZOFRAN ODT) 4 MG disintegrating tablet Place 1 tablet under the tongue every 6 to 8 hours PRN nausea 03/09/21   Noralyn Pick, NP  oxyCODONE (OXY IR/ROXICODONE) 5 MG immediate release tablet Take 1 tablet (5 mg total) by mouth every 6 (six) hours as needed for up to 5 days for moderate pain. 03/11/21 03/16/21  Jacqlyn Larsen, PA-C  pantoprazole (PROTONIX) 40 MG tablet Take 1 tablet (40 mg total) by mouth daily. Patient taking differently: Take 40 mg by mouth daily as needed (heart burn). 12/25/20   Lajean Saver, MD  promethazine (PHENERGAN) 25 MG tablet TAKE 1 TABLET BY MOUTH EVERY 12 HOURS AS NEEDED Patient taking differently: Take 25 mg by mouth every 12 (twelve) hours as needed for nausea or vomiting. 01/16/21   [place], Betty G, MD    Allergies    Cortisone, Eggs or egg-derived products, Ketorolac, Ketorolac tromethamine, Haldol [haloperidol lactate], Haloperidol, Hydrocortisone, Iodinated diagnostic agents, Ketorolac tromethamine, Reglan [metoclopramide], and Compazine [prochlorperazine]  Review of Systems    Review of Systems  Constitutional:  Negative for chills and fever.  HENT: Negative.    Respiratory:  Negative for cough and shortness of breath.   Cardiovascular:  Negative for chest pain.  Gastrointestinal:  Positive for abdominal pain, nausea and vomiting. Negative for constipation and diarrhea.  Skin:  Negative for color change and rash.  Neurological:  Negative for weakness and light-headedness.  All other systems reviewed and are negative.  Physical Exam Updated Vital Signs BP 132/76   Pulse 62   Temp 98.5 F (36.9 C) (Oral)   Resp 12   SpO2 100%   Physical Exam Vitals and nursing note reviewed.  Constitutional:      General: He is not in acute distress.    Appearance: Normal appearance. He is well-developed and normal weight. He is not ill-appearing or diaphoretic.  HENT:     Head: Normocephalic and atraumatic.     Mouth/Throat:     Mouth: Mucous membranes  are moist.     Pharynx: Oropharynx is clear.  Eyes:     General:        Right eye: No discharge.        Left eye: No discharge.     Pupils: Pupils are equal, round, and reactive to light.  Cardiovascular:     Rate and Rhythm: Normal rate and regular rhythm.     Pulses: Normal pulses.     Heart sounds: Normal heart sounds.  Pulmonary:     Effort: Pulmonary effort is normal. No respiratory distress.     Breath sounds: Normal breath sounds. No wheezing or rales.     Comments: Respirations equal and unlabored, patient able to speak in full sentences, lungs clear to auscultation bilaterally  Abdominal:     General: Bowel sounds are normal. There is no distension.     Palpations: Abdomen is soft. There is no mass.     Tenderness: There is abdominal tenderness. There is no guarding.     Comments: Abdomen soft, nondistended, bowel sounds present. There is tenderness in the RUQ and epigastric region without guarding  Musculoskeletal:        General: No deformity.     Cervical back: Neck supple.  Skin:    General:  Skin is warm and dry.     Capillary Refill: Capillary refill takes less than 2 seconds.  Neurological:     Mental Status: He is alert and oriented to person, place, and time.     Coordination: Coordination normal.     Comments: Speech is clear, able to follow commands Moves extremities without ataxia, coordination intact  Psychiatric:        Mood and Affect: Mood normal.        Behavior: Behavior normal.    ED Results / Procedures / Treatments   Labs (all labs ordered are listed, but only abnormal results are displayed) Labs Reviewed  COMPREHENSIVE METABOLIC PANEL - Abnormal; Notable for the following components:      Result Value   ALT 93 (*)    Alkaline Phosphatase 146 (*)    All other components within normal limits  CBC WITH DIFFERENTIAL/PLATELET - Abnormal; Notable for the following components:   WBC 13.0 (*)    Neutro Abs 8.3 (*)    Abs Immature Granulocytes 0.34 (*)    All other components within normal limits  LIPASE, BLOOD    EKG EKG Interpretation  Date/Time:  Saturday March 11 2021 08:40:12 EDT Ventricular Rate:  73 PR Interval:  148 QRS Duration: 96 QT Interval:  402 QTC Calculation: 443 R Axis:   53 Text Interpretation: Sinus rhythm Confirmed by Octaviano Glow [phone]) on 03/11/2021 9:12:41 AM  Radiology CT ABDOMEN PELVIS WO CONTRAST  Result Date: 03/11/2021 CLINICAL DATA:  Right-sided abdominal pain. Recent ERCP. EXAM: CT ABDOMEN AND PELVIS WITHOUT CONTRAST TECHNIQUE: Multidetector CT imaging of the abdomen and pelvis was performed following the standard protocol without IV contrast. COMPARISON:  CT abdomen pelvis 03/03/2021 FINDINGS: Lower chest: No acute abnormality. Evaluation of the abdominal viscera somewhat limited by the lack of IV contrast. Hepatobiliary: No focal liver abnormality is seen. Gallbladder surgically absent. There is mild pneumobilia. No new intra or extrahepatic biliary duct dilation. Pancreas: Unremarkable. No surrounding  inflammatory changes. Spleen: Normal in size without focal abnormality. Adrenals/Urinary Tract: Adrenal glands are unremarkable. No renal calculi, mass lesion, or hydronephrosis. Bladder is unremarkable. Stomach/Bowel: Stomach is within normal limits. Appendix is surgically absent is. No evidence of bowel wall thickening,  distention, or inflammatory changes. Vascular/Lymphatic: Aortic atherosclerosis. Vascular patency cannot be assessed in the absence of IV contrast no enlarged abdominal or pelvic lymph nodes. Reproductive: Prostate is unremarkable. Other: No abdominal wall hernia or abnormality. No abdominopelvic ascites. Musculoskeletal: No acute or significant osseous findings. IMPRESSION: 1.  No acute finding in the abdomen or pelvis on noncontrast CT. 2. Pneumobilia, as seen on prior studies, likely secondary to prior sphincterotomy. Aortic Atherosclerosis (ICD10-I70.0). Electronically Signed   By: Audie Pinto M.D.   On: 03/11/2021 11:18    Procedures Procedures   Medications Ordered in ED Medications  sodium chloride 0.9 % bolus 1,000 mL (0 mLs Intravenous Stopped 03/11/21 1136)  ondansetron (ZOFRAN) injection 4 mg (4 mg Intravenous Given 03/11/21 1029)  morphine 4 MG/ML injection 4 mg (4 mg Intravenous Given 03/11/21 1029)  HYDROmorphone (DILAUDID) injection 1 mg (1 mg Intravenous Given 03/11/21 1105)    ED Course  I have reviewed the triage vital signs and the nursing notes.  Pertinent labs & imaging results that were available during my care of the patient were reviewed by me and considered in my medical decision making (see chart for details).    MDM Rules/Calculators/A&P                           Patient presents to the ED with complaints of abdominal pain. Patient nontoxic appearing, in no apparent distress, vitals WNL. On exam patient tender to palpation in the epigastric region and RUQ, no peritoneal signs. Will evaluate with labs and CT abdomen pelvis. Pt with recent  admission for cholangitis. Analgesics, anti-emetics, and fluids administered.   Additional history obtained:  Additional history obtained from chart review & nursing note review.   Lab Tests:  I Ordered, reviewed, and interpreted labs, which included:  CBC: Mild leukocytosis, normal Hgb CMP: No electrolyte derangements, normal renal function, AST & ALT have returned to normal, alk phos is still mildly elevated, but improving Lipase: WNL   Imaging Studies ordered:  I ordered imaging studies which included Ct Abd Pel, I independently reviewed, formal radiology impression shows:  No acute findings, pneumobilia as expected after recent ERCP with sphincterotomy  ED Course:   RE-EVAL: Pain improved and tolerated PO  On repeat abdominal exam patient remains without peritoneal signs, low suspicion for cholecystitis, pancreatitis, diverticulitis, appendicitis, bowel obstruction/perforation, or other acute surgical process. Patient tolerating PO in the emergency department. Will discharge home with supportive measures. I discussed results, treatment plan, need for PCP follow-up, and return precautions with the patient. Provided opportunity for questions, patient confirmed understanding and is in agreement with plan.    Portions of this note were generated with [employer]. Dictation errors may occur despite best attempts at proofreading.     Final Clinical Impression(s) / ED Diagnoses Final diagnoses:  Generalized weakness  Nausea  Generalized abdominal pain    Rx / DC Orders ED Discharge Orders          Ordered    oxyCODONE (OXY IR/ROXICODONE) 5 MG immediate release tablet  Every 6 hours PRN        03/11/21 1315             Janet Berlin 03/11/21 2044    Wyvonnia Dusky, MD 03/12/21 [phone]

## 2021-03-13 NOTE — Progress Notes (Signed)
Chief Complaint  Patient presents with   Form Completion    Fitness for duty & fmla. Pt went out of work on 03/03/21 - next shift scheduled for 03/17/21.   HPI: [name] is a 45 y.o. male, who is here today for hospital follow-up. Hospitalized from 03/03/2021 to 03/06/2021.  He presented to the ER complaining of abdominal pain with associated nausea and vomiting. He has history of abdominal pain, he has been  treated in the ER with similar symptoms. Symptoms were associated with fever of 102.5 and tachycardia, 210/minute. Mild elevation of WBC and abnormal LFTs. Dx'ed with sepsis secondary to cholangitis and obstructive jaundice due to choledocholithiasis. Status post ERCP with stone extraction.  Per discharged summery, significant amount of stones were removed from the CBD.  Evaluated in the ED again on 03/11/21 because weakness.  Component     Latest Ref Rng & Units 03/03/2021  Total Protein     6.5 - 8.1 g/dL 7.8  Albumin     3.5 - 5.0 g/dL 4.3  AST     15 - 41 U/L 512 (H)  ALT     0 - 44 U/L 505 (H)  Alkaline Phosphatase     38 - 126 U/L 173 (H)  Total Bilirubin     0.3 - 1.2 mg/dL 4.3 (H)  Bilirubin, Direct     0.0 - 0.3 mg/dL    Component     Latest Ref Rng & Units 03/03/2021          WBC     4.0 - 10.5 K/uL 13.2 (H)  RBC     4.22 - 5.81 MIL/uL 5.26  Hemoglobin     13.0 - 17.0 g/dL 15.8  HCT     39.0 - 52.0 % 44.1  MCV     80.0 - 100.0 fL 83.8  MCH     26.0 - 34.0 pg 30.0  MCHC     30.0 - 36.0 g/dL 35.8  RDW     11.5 - 15.5 % 13.0  Platelets     150 - 400 K/uL 216  nRBC     0.0 - 0.2 % 0.0  Neutrophils     % 75  NEUT#     1.7 - 7.7 K/uL 9.8 (H)  Lymphocytes     % 9  Lymphocyte #     0.7 - 4.0 K/uL 1.2  Monocytes Relative     % 14  Monocyte #     0.1 - 1.0 K/uL 1.8 (H)  Eosinophil     % 2  Eosinophils Absolute     0.0 - 0.5 K/uL 0.3  Basophil     % 0  Basophils Absolute     0.0 - 0.1 K/uL 0.1  Immature Granulocytes     %  0  Abs Immature Granulocytes     0.00 - 0.07 K/uL 0.05  WBC Morphology         Diagnosed with sepsis secondary to cholangitis and obstructive jaundice due to choledocholithiasis.  He was treated with IV Zosyn, discharged on Augmentin. He is tolerating abx well. BCx no growth 5 days, final 03/08/21.  Lab Results  Component Value Date   ALT 93 (H) 03/11/2021   AST 29 03/11/2021   ALKPHOS 146 (H) 03/11/2021   BILITOT 1.2 03/11/2021   CT of abdomen/pelvis did not show acute findings.  Hepatic steatosis and aortic atherosclerosis. CT of abdomen/pelvis was repeated on 03/11/2021: No acute findings in  the abdomen or pelvis on noncontrast CT.  Pneumobilia, has seen on prior studies, likely secondary to prior sphincterectomy. He underwent ERCP on 03/04/2021:  Lab Results  Component Value Date   WBC 13.0 (H) 03/11/2021   HGB 15.8 03/11/2021   HCT 46.4 03/11/2021   MCV 88.5 03/11/2021   PLT 323 03/11/2021   Chronic abdominal pain, he is on opioid treatment, last visit on 02/24/21 the plan was to start weaning off medication.  He was discharged from hospital with oxycodone 5 mg, taking tab tid. Abdominal pain is back to its baseline.  Pending appointment with pain management for 03/17/2021.  Hypertension:  Medications:Metoprolol succinate 100 mg daily and Amlodipine-Benazepril 5-20 mg daily. BP readings at home:120/90's. Side effects:None.  Negative for unusual or severe headache, visual changes, exertional chest pain, dyspnea,  focal weakness, or edema.  Lab Results  Component Value Date   CREATININE 0.92 03/11/2021   BUN 17 03/11/2021   NA 136 03/11/2021   K 4.0 03/11/2021   CL 103 03/11/2021   CO2 25 03/11/2021   Today noted mild wheezing left apex. He has not noted cough,wheezing, or SOB. + Tobacco use.  Review of Systems  Constitutional:  Positive for fatigue. Negative for chills and fever.  HENT:  Negative for mouth sores, sore throat and trouble swallowing.    Gastrointestinal:  Positive for abdominal pain and nausea. Negative for vomiting.       No changes in bowel movement.  Genitourinary:  Negative for decreased urine volume, dysuria and hematuria.  Musculoskeletal:  Negative for gait problem and myalgias.  Neurological:  Negative for syncope, facial asymmetry and weakness.  Rest of ROS, see pertinent positives sand negatives in HPI  Current Outpatient Medications on File Prior to Visit  Medication Sig Dispense Refill   amLODipine-benazepril (LOTREL) 5-20 MG capsule TAKE 1 CAPSULE BY MOUTH EVERY DAY (Patient taking differently: Take 1 capsule by mouth daily.) 90 capsule 2   amoxicillin-clavulanate (AUGMENTIN) 875-125 MG tablet Take 1 tablet by mouth every 12 (twelve) hours for 10 days. 20 tablet 0   fluticasone (FLONASE) 50 MCG/ACT nasal spray Place 1 spray into both nostrils 2 (two) times daily. (Patient taking differently: Place 1 spray into both nostrils daily as needed for allergies.) 16 g 2   metoprolol succinate (TOPROL-XL) 100 MG 24 hr tablet TAKE 1 TABLET (100 MG TOTAL) BY MOUTH DAILY. TAKE WITH OR IMMEDIATELY FOLLOWING A MEAL. 90 tablet 1   naloxone (NARCAN) nasal spray 4 mg/0.1 mL 1 spray in each nostril x 1 if opioid overdose. (Patient taking differently: Place 1 spray into the nose once as needed (over dose). 1 spray in each nostril x 1 if opioid overdose.) 1 each 1   ondansetron (ZOFRAN ODT) 4 MG disintegrating tablet Place 1 tablet under the tongue every 6 to 8 hours PRN nausea 20 tablet 0   pantoprazole (PROTONIX) 40 MG tablet Take 1 tablet (40 mg total) by mouth daily. (Patient taking differently: Take 40 mg by mouth daily as needed (heart burn).) 30 tablet 0   promethazine (PHENERGAN) 25 MG tablet TAKE 1 TABLET BY MOUTH EVERY 12 HOURS AS NEEDED (Patient taking differently: Take 25 mg by mouth every 12 (twelve) hours as needed for nausea or vomiting.) 45 tablet 1   No current facility-administered medications on file prior to visit.    Past Medical History:  Diagnosis Date   Chronic abdominal pain    Diverticulitis    Drug-seeking behavior    Hypertension  Kidney stones    Pancreatitis, chronic ([place]) 05/28/2005   Allergies  Allergen Reactions   Cortisone Other (See Comments)    drops potassium level "bottoms out" potassium level drops potassium level Bottoms out potassium  "bottoms out" potassium level Other reaction(s): Other (See Comments) Bottoms out potassium   Eggs Or Egg-Derived Products Hives and Other (See Comments)    Rash  Rash   Ketorolac Hives and Other (See Comments)    Hives, slightly labored breathing  Hives, slightly labored breathing   Ketorolac Tromethamine Hives and Shortness Of Breath   Haldol [Haloperidol Lactate] Other (See Comments)    "jittery"    Haloperidol Other (See Comments)    "jittery" "jittery"    Hydrocortisone Hives    Also drops potassium level   Iodinated Diagnostic Agents Nausea And Vomiting and Other (See Comments)    Hives Hives Hives Hives   Ketorolac Tromethamine Hives   Reglan [Metoclopramide]     Face "draws" and gets figgety   Compazine [Prochlorperazine] Other (See Comments)    Dystonic reaction    Social History   Socioeconomic History   Marital status: Married    Spouse name: Not on file   Number of children: Not on file   Years of education: Not on file   Highest education level: Not on file  Occupational History   Not on file  Tobacco Use   Smoking status: Every Day    Packs/day: 0.50    Types: Cigarettes   Smokeless tobacco: Never  Vaping Use   Vaping Use: Never used  Substance and Sexual Activity   Alcohol use: Not Currently    Comment: rare   Drug use: No   Sexual activity: Not on file  Other Topics Concern   Not on file  Social History Narrative   Not on file   Social Determinants of Health   Financial Resource Strain: Not on file  Food Insecurity: Not on file  Transportation Needs: Not on file  Physical  Activity: Not on file  Stress: Not on file  Social Connections: Not on file   Vitals:   03/14/21 1403  BP: 124/90  Pulse: 98  Resp: 16  SpO2: 99%   Body mass index is 26.65 kg/m.  Physical Exam Nursing note reviewed.  Constitutional:      General: He is not in acute distress.    Appearance: He is well-developed.  HENT:     Head: Normocephalic and atraumatic.  Eyes:     Conjunctiva/sclera: Conjunctivae normal.  Cardiovascular:     Rate and Rhythm: Normal rate and regular rhythm.     Pulses:          Dorsalis pedis pulses are 2+ on the right side and 2+ on the left side.     Heart sounds: No murmur heard. Pulmonary:     Effort: Pulmonary effort is normal. No respiratory distress.     Breath sounds: Examination of the left-upper field reveals wheezing. Wheezing present.  Abdominal:     Palpations: Abdomen is soft. There is no hepatomegaly or mass.     Tenderness: There is no abdominal tenderness.  Lymphadenopathy:     Cervical: No cervical adenopathy.  Skin:    General: Skin is warm.     Findings: No erythema or rash.  Neurological:     Mental Status: He is alert and oriented to person, place, and time.     Cranial Nerves: No cranial nerve deficit.     Gait: Gait normal.  Psychiatric:     Comments: Well groomed, good eye contact.   ASSESSMENT AND PLAN:  [name] was seen today for hospitalization months follow-up.  Orders Placed This Encounter  Procedures   DG Chest 2 View   Elevated transaminase level Choledocholithiasis s/p ERCP and stone extraction. LFT's are improving. He has appt with GI 04/05/21. FMLA completed to go back 03/17/21.  Chronic abdominal pain Abdominal pain is back to baseline. Continue Oxycodone 5 mg tid. He has an appt with pain management on 03/17/21.  Wheezing Noted on auscultation today, asymptomatic. Encouraged smoking cessation.  Instructed about warning signs.  Hypertension, essential, benign DBP mildly  elevated, I would like it < 90, ideally <80. Benazepril dose increased from 20 mg to 40 mg. No changes in Amlodipine or Metoprolol succinate.  DASH/low salt diet to continue. Continue monitoring BP at home, instructed to let me know about BP readings in 3 weeks. Eye exam recommended annually.  Return in about 3 months (around 06/14/2021).   Blake Vetrano G. Martin[place], MD  [facility]. [place] office.

## 2021-03-14 ENCOUNTER — Encounter: Payer: Self-pay | Admitting: Family Medicine

## 2021-03-14 ENCOUNTER — Ambulatory Visit (INDEPENDENT_AMBULATORY_CARE_PROVIDER_SITE_OTHER): Payer: BLUE CROSS/BLUE SHIELD

## 2021-03-14 ENCOUNTER — Other Ambulatory Visit: Payer: Self-pay

## 2021-03-14 ENCOUNTER — Ambulatory Visit (INDEPENDENT_AMBULATORY_CARE_PROVIDER_SITE_OTHER): Payer: BLUE CROSS/BLUE SHIELD | Admitting: Family Medicine

## 2021-03-14 VITALS — BP 124/90 | HR 98 | Resp 16 | Ht 73.0 in | Wt 202.0 lb

## 2021-03-14 DIAGNOSIS — R062 Wheezing: Secondary | ICD-10-CM

## 2021-03-14 DIAGNOSIS — G8929 Other chronic pain: Secondary | ICD-10-CM

## 2021-03-14 DIAGNOSIS — R7401 Elevation of levels of liver transaminase levels: Secondary | ICD-10-CM

## 2021-03-14 DIAGNOSIS — R109 Unspecified abdominal pain: Secondary | ICD-10-CM | POA: Diagnosis not present

## 2021-03-14 DIAGNOSIS — I10 Essential (primary) hypertension: Secondary | ICD-10-CM

## 2021-03-14 MED ORDER — OXYCODONE HCL 5 MG PO TABS: 5.0000 mg | ORAL_TABLET | Freq: Three times a day (TID) | ORAL | 0 refills | Status: DC | PRN

## 2021-03-14 MED ORDER — BENAZEPRIL HCL 20 MG PO TABS: 20.0000 mg | ORAL_TABLET | Freq: Every day | ORAL | 1 refills | Status: DC

## 2021-03-14 NOTE — Assessment & Plan Note (Signed)
DBP mildly elevated, I would like it < 90, ideally <80. Benazepril dose increased from 20 mg to 40 mg. No changes in Amlodipine or Metoprolol succinate.  DASH/low salt diet to continue. Continue monitoring BP at home, instructed to let me know about BP readings in 3 weeks. Eye exam recommended annually.

## 2021-03-14 NOTE — Patient Instructions (Addendum)
A few things to remember from today's visit:   Hypertension, essential, benign  Elevated transaminase level  Chronic abdominal pain  Wheezing - Plan: DG Chest 2 View  If you need refills please call your pharmacy. Do not use My Chart to request refills or for acute issues that need immediate attention.   Today benazepril was increased from 20 mg to 40 mg daily. Continue Amlodipine-Benazepril 5-20 mg, today we added one tab of benazepril 20 mg. Continue monitoring blood pressure, please let me know about reading in 3 weeks. No changes in Oxycodone dose.  Please be sure medication list is accurate. If a new problem present, please set up appointment sooner than planned today.

## 2021-03-16 ENCOUNTER — Encounter: Payer: Self-pay | Admitting: Family Medicine

## 2021-03-17 ENCOUNTER — Other Ambulatory Visit: Payer: Self-pay | Admitting: Family Medicine

## 2021-03-17 ENCOUNTER — Other Ambulatory Visit: Payer: Self-pay

## 2021-03-17 ENCOUNTER — Encounter
Payer: BLUE CROSS/BLUE SHIELD | Attending: Physical Medicine and Rehabilitation | Admitting: Physical Medicine and Rehabilitation

## 2021-03-17 ENCOUNTER — Encounter: Payer: Self-pay | Admitting: Physical Medicine and Rehabilitation

## 2021-03-17 VITALS — BP 147/96 | HR 80 | Ht 73.0 in | Wt 205.6 lb

## 2021-03-17 DIAGNOSIS — Z79891 Long term (current) use of opiate analgesic: Secondary | ICD-10-CM | POA: Insufficient documentation

## 2021-03-17 DIAGNOSIS — K861 Other chronic pancreatitis: Secondary | ICD-10-CM | POA: Insufficient documentation

## 2021-03-17 DIAGNOSIS — G894 Chronic pain syndrome: Secondary | ICD-10-CM | POA: Diagnosis not present

## 2021-03-17 DIAGNOSIS — Z5181 Encounter for therapeutic drug level monitoring: Secondary | ICD-10-CM | POA: Insufficient documentation

## 2021-03-17 DIAGNOSIS — R112 Nausea with vomiting, unspecified: Secondary | ICD-10-CM

## 2021-03-17 DIAGNOSIS — G4701 Insomnia due to medical condition: Secondary | ICD-10-CM | POA: Insufficient documentation

## 2021-03-17 MED ORDER — OXYCODONE HCL 10 MG PO TABS: 10.0000 mg | ORAL_TABLET | Freq: Four times a day (QID) | ORAL | 0 refills | Status: DC | PRN

## 2021-03-17 NOTE — Progress Notes (Signed)
Subjective:    Patient ID: [name], male    DOB: 03-18-1976, 45 y.o.   MRN: [id number]  HPI [name] is 45 year old man who presents to establish care for chronic pain secondary to pancreatitis.   He takes oxycodone 5mg  about 4 times per day. He was on 10mg  and had a hospital visit recently for [facility] for stones in his biliary duct. At first he was prescribed 15mg  and then he was weaned down. On a day to day basis his pain is 5-6/10. Today his pain is 7/10.   He works 12 hour shifts and his pain can sometimes get in the way of his job. He works as a Doctor, hospital.   He feels no negative consequences to the oxycodone- no constipation or cognitive side effects.  He has already tried [place] and this has not helped much.  He tried Gabapentin and Lyrica years ago and this caused brain fog.   Pain Inventory Average Pain 6 Pain Right Now 7 My pain is constant, sharp, and stabbing  LOCATION OF PAIN  abdomen  BOWEL Number of stools per week: 8-10 Oral laxative use No    BLADDER Normal   Mobility ability to climb steps?  yes do you drive?  yes Do you have any goals in this area?  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  no  Physicians involved in your care Any changes since last visit?  no   Family History  Problem Relation Age of Onset   Cancer Mother    Early death Mother    Hypertension Mother    Heart failure Father    Hypertension Father    Heart disease Father    Hyperlipidemia Father    Pancreatic cancer Paternal Grandmother        possibly   Pancreatic cancer Paternal Aunt    Colon cancer Neg Hx    Social History   Socioeconomic History   Marital status: Married    Spouse name: Not on file   Number of children: Not on file   Years of education: Not on file   Highest education level: Not on file  Occupational  History   Not on file  Tobacco Use   Smoking status: Every Day    Packs/day: 0.50    Types: Cigarettes   Smokeless tobacco: Never  Vaping Use   Vaping Use: Never used  Substance and Sexual Activity   Alcohol use: Not Currently    Comment: rare   Drug use: No   Sexual activity: Not on file  Other Topics Concern   Not on file  Social History Narrative   Not on file   Social Determinants of Health   Financial Resource Strain: Not on file  Food Insecurity: Not on file  Transportation Needs: Not on file  Physical Activity: Not on file  Stress: Not on file  Social Connections: Not on file   Past Surgical History:  Procedure Laterality Date   APPENDECTOMY     CHOLECYSTECTOMY     ERCP N/A 12/30/2019   Procedure: ENDOSCOPIC RETROGRADE CHOLANGIOPANCREATOGRAPHY (ERCP);  Surgeon: Milus Banister, MD;  Location: [facility];  Service: Endoscopy;  Laterality: N/A;   ERCP N/A 03/04/2021   Procedure: ENDOSCOPIC RETROGRADE CHOLANGIOPANCREATOGRAPHY (ERCP);  Surgeon: Carol Ada, MD;  Location: [facility];  Service: Endoscopy;  Laterality: N/A;   ERCP  W/ METAL STENT PLACEMENT     KIDNEY STONE SURGERY     REMOVAL OF STONES  12/30/2019   Procedure: REMOVAL OF STONES;  Surgeon: Milus Banister, MD;  Location: [facility];  Service: Endoscopy;;   REMOVAL OF STONES  03/04/2021   Procedure: REMOVAL OF STONES;  Surgeon: Carol Ada, MD;  Location: [facility];  Service: Endoscopy;;   SPHINCTEROTOMY  03/04/2021   Procedure: Joan Mayans;  Surgeon: Carol Ada, MD;  Location: [facility];  Service: Endoscopy;;   Past Medical History:  Diagnosis Date   Chronic abdominal pain    Diverticulitis    Drug-seeking behavior    Hypertension    Kidney stones    Pancreatitis, chronic ([place]) 05/28/2005   Ht 6\' 1"  (1.854 m)   Wt 205 lb 9.6 oz (93.3 kg)   BMI 27.13 kg/m   Opioid Risk Score:   Fall Risk Score:  `1  Depression screen PHQ 2/9  Depression screen [facility] 2/9 03/14/2021 06/08/2020  04/28/2019 06/03/2015  Decreased Interest 0 0 0 2  Down, Depressed, Hopeless 0 0 0 2  PHQ - 2 Score 0 0 0 4  Altered sleeping - - - 2  Tired, decreased energy - - - 2  Change in appetite - - - 2  Feeling bad or failure about yourself  - - - 1  Trouble concentrating - - - 0  Moving slowly or fidgety/restless - - - 0  Suicidal thoughts - - - 0  PHQ-9 Score - - - 11  Some recent data might be hidden     Review of Systems  Constitutional: Negative.   HENT: Negative.    Eyes: Negative.   Respiratory: Negative.    Cardiovascular: Negative.   Gastrointestinal:  Positive for abdominal pain.  Endocrine: Negative.   Genitourinary: Negative.   Musculoskeletal: Negative.   Skin: Negative.   Allergic/Immunologic: Negative.   Neurological: Negative.   Hematological: Negative.   Psychiatric/Behavioral: Negative.        Objective:   Physical Exam Gen: no distress, normal appearing HEENT: oral mucosa pink and moist, NCAT Cardio: Reg rate Chest: normal effort, normal rate of breathing Abd: soft, non-distended Ext: no edema Psych: pleasant, normal affect Skin: intact Neuro: Alert and oriented x3 Musculoskeletal: Normal gait    Assessment & Plan:   1) Chronic Pain Syndrome secondary to chronic pancreatitis  -Discussed current symptoms of pain and history of pain.  -Discussed benefits of exercise in reducing pain. -UDS and pain contract signed today.  -Encouraged continued working.  -does not like tylenol because LFTs.  -has failed gabapentin, cymbalta, lyrica -discussed that we can take over prescribing his oxycodone- will increase oxycodone 10mg  about 3-4 per day PRN.  -Discussed following foods that may reduce pain: 1) Ginger (especially studied for arthritis)- reduce leukotriene production to decrease inflammation 2) Blueberries- high in phytonutrients that decrease inflammation 3) Salmon- marine omega-3s reduce joint swelling and pain 4) Pumpkin seeds- reduce inflammation 5)  dark chocolate- reduces inflammation 6) turmeric- reduces inflammation 7) tart cherries - reduce pain and stiffness 8) extra virgin olive oil - its compound olecanthal helps to block prostaglandins  9) chili peppers- can be eaten or applied topically via capsaicin 10) mint- helpful for headache, muscle aches, joint pain, and itching 11) garlic- reduces inflammation  Link to further information on diet for chronic pain: [email or web address]   2) Insomnia: -Try to go outside near sunrise -Get exercise during the day.  -Turn off all devices an hour before bedtime.  -Teas  that can benefit: chamomile, valerian root, Brahmi (Bacopa) -Can consider over the counter melatonin or magnesium glycinate (latter can also help with constipation) -Pistachios naturally increase the production of melatonin

## 2021-03-17 NOTE — Addendum Note (Signed)
Addended by: Jasmine December T on: 03/17/2021 10:29 AM   Modules accepted: Orders

## 2021-03-17 NOTE — Patient Instructions (Signed)
Insomnia: -Try to go outside near sunrise -Get exercise during the day.  -Turn off all devices an hour before bedtime.  -Teas that can benefit: chamomile, valerian root, Brahmi (Bacopa) -Can consider over the counter melatonin or magnesium glycinate (latter can also help with constipation) -Pistachios naturally increase the production of melatonin   -Foods that may reduce pain: 1) Ginger (especially studied for arthritis)- reduce leukotriene production to decrease inflammation 2) Blueberries- high in phytonutrients that decrease inflammation 3) Salmon- marine omega-3s reduce joint swelling and pain 4) Pumpkin seeds- reduce inflammation 5) dark chocolate- reduces inflammation 6) turmeric- reduces inflammation 7) tart cherries - reduce pain and stiffness 8) extra virgin olive oil - its compound olecanthal helps to block prostaglandins  9) chili peppers- can be eaten or applied topically via capsaicin 10) mint- helpful for headache, muscle aches, joint pain, and itching 11) garlic- reduces inflammation  Link to further information on diet for chronic pain: [email or web address]

## 2021-03-18 LAB — HEPATITIS PANEL, ACUTE
Hep A IgM: NONREACTIVE
Hep B C IgM: NONREACTIVE
Hepatitis B Surface Ag: NONREACTIVE

## 2021-03-24 LAB — TOXASSURE SELECT,+ANTIDEPR,UR

## 2021-03-29 ENCOUNTER — Telehealth: Payer: Self-pay | Admitting: *Deleted

## 2021-03-29 NOTE — Telephone Encounter (Signed)
Urine drug screen for 03/17/21 shows oxycodone present as expected. He has a large amount of tramadol present and has not been prescribed tramadol in past 5 yrs per PMP.  I verified with [facility] toxicologist that the tramadol is present.

## 2021-03-30 ENCOUNTER — Other Ambulatory Visit: Payer: Self-pay

## 2021-03-30 ENCOUNTER — Emergency Department (HOSPITAL_BASED_OUTPATIENT_CLINIC_OR_DEPARTMENT_OTHER)
Admission: EM | Admit: 2021-03-30 | Discharge: 2021-03-31 | Disposition: A | Discharge status: Home / Self Care | Payer: BLUE CROSS/BLUE SHIELD | Attending: Emergency Medicine | Admitting: Emergency Medicine

## 2021-03-30 ENCOUNTER — Encounter (HOSPITAL_BASED_OUTPATIENT_CLINIC_OR_DEPARTMENT_OTHER): Payer: Self-pay | Admitting: *Deleted

## 2021-03-30 DIAGNOSIS — R1011 Right upper quadrant pain: Secondary | ICD-10-CM | POA: Insufficient documentation

## 2021-03-30 DIAGNOSIS — I10 Essential (primary) hypertension: Secondary | ICD-10-CM | POA: Insufficient documentation

## 2021-03-30 DIAGNOSIS — G8929 Other chronic pain: Secondary | ICD-10-CM | POA: Insufficient documentation

## 2021-03-30 DIAGNOSIS — F17210 Nicotine dependence, cigarettes, uncomplicated: Secondary | ICD-10-CM | POA: Insufficient documentation

## 2021-03-30 DIAGNOSIS — Z79899 Other long term (current) drug therapy: Secondary | ICD-10-CM | POA: Diagnosis not present

## 2021-03-30 DIAGNOSIS — R112 Nausea with vomiting, unspecified: Secondary | ICD-10-CM | POA: Insufficient documentation

## 2021-03-30 DIAGNOSIS — R109 Unspecified abdominal pain: Secondary | ICD-10-CM

## 2021-03-30 LAB — COMPREHENSIVE METABOLIC PANEL
ALT: 26 U/L (ref 0–44)
AST: 21 U/L (ref 15–41)
Albumin: 4.5 g/dL (ref 3.5–5.0)
Alkaline Phosphatase: 78 U/L (ref 38–126)
Anion gap: 12 (ref 5–15)
BUN: 14 mg/dL (ref 6–20)
CO2: 24 mmol/L (ref 22–32)
Calcium: 9.8 mg/dL (ref 8.9–10.3)
Chloride: 105 mmol/L (ref 98–111)
Creatinine, Ser: 0.84 mg/dL (ref 0.61–1.24)
GFR, Estimated: >60 mL/min (ref >60)
Glucose, Bld: 105 mg/dL — ABNORMAL HIGH (ref 70–99)
Potassium: 3.3 mmol/L — ABNORMAL LOW (ref 3.5–5.1)
Sodium: 141 mmol/L (ref 135–145)
Total Bilirubin: 0.7 mg/dL (ref 0.3–1.2)
Total Protein: 8 g/dL (ref 6.5–8.1)

## 2021-03-30 LAB — CBC
HCT: 46.4 % (ref 39.0–52.0)
Hemoglobin: 16.1 g/dL (ref 13.0–17.0)
MCH: 28.9 pg (ref 26.0–34.0)
MCHC: 34.7 g/dL (ref 30.0–36.0)
MCV: 83.3 fL (ref 80.0–100.0)
Platelets: 221 10*3/uL (ref 150–400)
RBC: 5.57 MIL/uL (ref 4.22–5.81)
RDW: 12.6 % (ref 11.5–15.5)
WBC: 13.6 10*3/uL — ABNORMAL HIGH (ref 4.0–10.5)
nRBC: 0 % (ref 0.0–0.2)

## 2021-03-30 LAB — LIPASE, BLOOD: Lipase: 48 U/L (ref 11–51)

## 2021-03-30 MED ORDER — HYDROMORPHONE HCL 1 MG/ML IJ SOLN
1.0000 mg | Freq: Once | INTRAMUSCULAR | Status: AC
  Administered 2021-03-30: 1 mg via INTRAVENOUS
  Filled 2021-03-30: qty 1

## 2021-03-30 MED ORDER — ONDANSETRON HCL 4 MG/2ML IJ SOLN
4.0000 mg | Freq: Once | INTRAMUSCULAR | Status: AC
  Administered 2021-03-30: 4 mg via INTRAVENOUS
  Filled 2021-03-30: qty 2

## 2021-03-30 NOTE — ED Triage Notes (Signed)
Pt is here for RUQ abdominal pain x2 days.  Pt reports hx of pancreatitis and ERCP about a month ago, pt states that he had been recovering well until pain began 2 days ago and became more severe and pt has been having n/v and reports that he has been unable to keep much down over the past 8-10 hours,can not tell me how many times he has vomited, "numerous times".

## 2021-03-30 NOTE — Telephone Encounter (Signed)
Per Dr Ranell Patrick, a letter has been sent through [facility] and [place] mail informing him of non narcotic only status and he can continue to be seen by Dr Ranell Patrick only if he would like.

## 2021-03-31 MED ORDER — HYDROMORPHONE HCL 1 MG/ML IJ SOLN
1.0000 mg | Freq: Once | INTRAMUSCULAR | Status: AC
Start: 2021-03-31 — End: 2021-03-31
  Administered 2021-03-31: 1 mg via INTRAVENOUS
  Filled 2021-03-31: qty 1

## 2021-03-31 NOTE — ED Provider Notes (Signed)
[facility] EMERGENCY DEPT Provider Note   CSN: [id number] Arrival date & time: 03/30/21  2225     History Chief Complaint  Patient presents with   Abdominal Pain    [name] is a 45 y.o. male.  The history is provided by the patient.  Abdominal Pain Pain location:  RUQ Pain quality: cramping   Pain radiates to:  Epigastric region, suprapubic region and periumbilical region Pain severity:  Severe Onset quality:  Gradual Duration:  2 days Timing:  Constant Progression:  Worsening Chronicity:  Chronic Context: not suspicious food intake and not trauma   Relieved by:  Nothing Worsened by:  Nothing Ineffective treatments: home narcotics. Associated symptoms: nausea and vomiting   Associated symptoms: no constipation, no diarrhea, no dysuria and no fever   Risk factors: no NSAID use and no recent hospitalization       Past Medical History:  Diagnosis Date   Chronic abdominal pain    Diverticulitis    Drug-seeking behavior    Hypertension    Kidney stones    Pancreatitis, chronic ([place]) 05/28/2005    Patient Active Problem List   Diagnosis Date Noted   Acute cholangitis 03/03/2021   Drug-seeking behavior 11/25/2020   Diverticulitis 11/25/2020   Kidney stones 11/25/2020   Hyperlipidemia, mixed 03/01/2020   Abnormal magnetic resonance cholangiopancreatography (MRCP)    Choledocholithiasis    E coli bacteremia    Bacteremia due to Enterobacter species    Bacteremia 12/27/2019   QT prolongation 12/27/2019   Nausea without vomiting    Chronic pain disorder 08/29/2018   Back pain, chronic 08/29/2018   Nausea and vomiting in adult 02/25/2018   Hypertension, essential, benign 01/18/2018   Chronic biliary pancreatitis ([place]) 01/18/2018   Chronic abdominal pain 01/14/2018    Past Surgical History:  Procedure Laterality Date   APPENDECTOMY     CHOLECYSTECTOMY     ERCP N/A 12/30/2019   Procedure: ENDOSCOPIC RETROGRADE CHOLANGIOPANCREATOGRAPHY  (ERCP);  Surgeon: Milus Banister, MD;  Location: [facility];  Service: Endoscopy;  Laterality: N/A;   ERCP N/A 03/04/2021   Procedure: ENDOSCOPIC RETROGRADE CHOLANGIOPANCREATOGRAPHY (ERCP);  Surgeon: Carol Ada, MD;  Location: [facility];  Service: Endoscopy;  Laterality: N/A;   ERCP W/ METAL STENT PLACEMENT     KIDNEY STONE SURGERY     REMOVAL OF STONES  12/30/2019   Procedure: REMOVAL OF STONES;  Surgeon: Milus Banister, MD;  Location: [facility];  Service: Endoscopy;;   REMOVAL OF STONES  03/04/2021   Procedure: REMOVAL OF STONES;  Surgeon: Carol Ada, MD;  Location: [facility];  Service: Endoscopy;;   SPHINCTEROTOMY  03/04/2021   Procedure: Joan Mayans;  Surgeon: Carol Ada, MD;  Location: [facility];  Service: Endoscopy;;       Family History  Problem Relation Age of Onset   Cancer Mother    Early death Mother    Hypertension Mother    Heart failure Father    Hypertension Father    Heart disease Father    Hyperlipidemia Father    Pancreatic cancer Paternal Grandmother        possibly   Pancreatic cancer Paternal Aunt    Colon cancer Neg Hx     Social History   Tobacco Use   Smoking status: Every Day    Packs/day: 0.50    Types: Cigarettes   Smokeless tobacco: Never  Vaping Use   Vaping Use: Never used  Substance Use Topics   Alcohol use: Not Currently  Comment: rare   Drug use: No    Home Medications Prior to Admission medications   Medication Sig Start Date End Date Taking? Authorizing Provider  amLODipine-benazepril (LOTREL) 5-20 MG capsule TAKE 1 CAPSULE BY MOUTH EVERY DAY Patient taking differently: Take 1 capsule by mouth daily. 11/12/20   [place], Betty G, MD  benazepril (LOTENSIN) 20 MG tablet Take 1 tablet (20 mg total) by mouth daily. 03/14/21   [place], Betty G, MD  fluticasone (FLONASE) 50 MCG/ACT nasal spray Place 1 spray into both nostrils 2 (two) times daily. Patient taking differently: Place 1 spray into both nostrils daily  as needed for allergies. 04/03/19   [place], Betty G, MD  metoprolol succinate (TOPROL-XL) 100 MG 24 hr tablet TAKE 1 TABLET (100 MG TOTAL) BY MOUTH DAILY. TAKE WITH OR IMMEDIATELY FOLLOWING A MEAL. 10/25/20   [place], Betty G, MD  naloxone [facility]) nasal spray 4 mg/0.1 mL 1 spray in each nostril x 1 if opioid overdose. Patient taking differently: Place 1 spray into the nose once as needed (over dose). 1 spray in each nostril x 1 if opioid overdose. 06/08/20   [place], Betty G, MD  ondansetron (ZOFRAN ODT) 4 MG disintegrating tablet Place 1 tablet under the tongue every 6 to 8 hours PRN nausea 03/09/21   Noralyn Pick, NP  Oxycodone HCl 10 MG TABS Take 1 tablet (10 mg total) by mouth every 6 (six) hours as needed. 03/17/21   Raulkar, Clide Deutscher, MD  pantoprazole (PROTONIX) 40 MG tablet Take 1 tablet (40 mg total) by mouth daily. Patient taking differently: Take 40 mg by mouth daily as needed (heart burn). 12/25/20   Lajean Saver, MD  promethazine (PHENERGAN) 25 MG tablet TAKE 1 TABLET BY MOUTH EVERY 12 HOURS AS NEEDED 03/17/21   [place], Betty G, MD    Allergies    Cortisone, Eggs or egg-derived products, Ketorolac, Ketorolac tromethamine, Haldol [haloperidol lactate], Haloperidol, Hydrocortisone, Iodinated diagnostic agents, Ketorolac tromethamine, Reglan [metoclopramide], and Compazine [prochlorperazine]  Review of Systems   Review of Systems  Constitutional:  Negative for fever.  HENT:  Negative for congestion.   Eyes:  Negative for redness.  Respiratory:  Negative for wheezing and stridor.   Cardiovascular:  Negative for palpitations and leg swelling.  Gastrointestinal:  Positive for abdominal pain, nausea and vomiting. Negative for constipation and diarrhea.  Genitourinary:  Negative for dysuria.  Musculoskeletal:  Negative for neck pain and neck stiffness.  Skin:  Negative for color change.  Neurological:  Negative for facial asymmetry.  Psychiatric/Behavioral:  Negative for  agitation.   All other systems reviewed and are negative.  Physical Exam Updated Vital Signs BP 138/89 (BP Location: Right Arm)   Pulse 69   Temp 99.1 F (37.3 C) (Oral)   Resp 13   Ht 6\' 1"  (1.854 m)   Wt 93.3 kg   SpO2 96%   BMI 27.14 kg/m   Physical Exam Vitals and nursing note reviewed.  Constitutional:      General: He is not in acute distress.    Appearance: Normal appearance.  HENT:     Head: Normocephalic and atraumatic.     Nose: Nose normal.  Eyes:     Conjunctiva/sclera: Conjunctivae normal.     Pupils: Pupils are equal, round, and reactive to light.  Cardiovascular:     Rate and Rhythm: Normal rate and regular rhythm.     Pulses: Normal pulses.     Heart sounds: Normal heart sounds.  Pulmonary:  Effort: Pulmonary effort is normal.     Breath sounds: Normal breath sounds.  Abdominal:     General: Abdomen is flat. Bowel sounds are normal.     Palpations: Abdomen is soft.     Tenderness: There is no abdominal tenderness. There is no guarding.  Musculoskeletal:        General: Normal range of motion.     Cervical back: Normal range of motion and neck supple.  Skin:    General: Skin is warm and dry.     Capillary Refill: Capillary refill takes less than 2 seconds.  Neurological:     General: No focal deficit present.     Mental Status: He is alert and oriented to person, place, and time.     Deep Tendon Reflexes: Reflexes normal.  Psychiatric:        Mood and Affect: Mood normal.        Behavior: Behavior normal.    ED Results / Procedures / Treatments   Labs (all labs ordered are listed, but only abnormal results are displayed) Labs Reviewed  COMPREHENSIVE METABOLIC PANEL - Abnormal; Notable for the following components:      Result Value   Potassium 3.3 (*)    Glucose, Bld 105 (*)    All other components within normal limits  CBC - Abnormal; Notable for the following components:   WBC 13.6 (*)    All other components within normal limits   LIPASE, BLOOD  URINALYSIS, ROUTINE W REFLEX MICROSCOPIC    EKG None  Radiology No results found.  Procedures Procedures   Medications Ordered in ED Medications  HYDROmorphone (DILAUDID) injection 1 mg (1 mg Intravenous Given 03/30/21 2338)  ondansetron (ZOFRAN) injection 4 mg (4 mg Intravenous Given 03/30/21 2338)  HYDROmorphone (DILAUDID) injection 1 mg (1 mg Intravenous Given 03/31/21 0032)    ED Course  I have reviewed the triage vital signs and the nursing notes.  Pertinent labs & imaging results that were available during my care of the patient were reviewed by me and considered in my medical decision making (see chart for details).   Multiple visits for same.  No emesis in the ED.  Exam, vitals are benign and reassuring.  I do not believe imaging is indicated at this time.    Follow up with your PMD.    Final Clinical Impression(s) / ED Diagnoses Final diagnoses:  Abdominal pain, unspecified abdominal location  Return for intractable cough, coughing up blood, fevers > 100.4 unrelieved by medication, shortness of breath, intractable vomiting, chest pain, shortness of breath, weakness, numbness, changes in speech, facial asymmetry, abdominal pain, passing out, Inability to tolerate liquids or food, cough, altered mental status or any concerns. No signs of systemic illness or infection. The patient is nontoxic-appearing on exam and vital signs are within normal limits.  I have reviewed the triage vital signs and the nursing notes. Pertinent labs & imaging results that were available during my care of the patient were reviewed by me and considered in my medical decision making (see chart for details). After history, exam, and medical workup I feel the patient has been appropriately medically screened and is safe for discharge home. Pertinent diagnoses were discussed with the patient. Patient was given return precautions.     Rx / DC Orders ED Discharge Orders     None         Henny Strauch, MD 03/31/21 [phone]

## 2021-03-31 NOTE — ED Notes (Signed)
D/c paperwork reviewed with pt, including f/u care. Pt with no questions or concerns at time of d/c. Ambulatory to ED exit.

## 2021-04-01 ENCOUNTER — Other Ambulatory Visit: Payer: Self-pay

## 2021-04-01 ENCOUNTER — Encounter (HOSPITAL_BASED_OUTPATIENT_CLINIC_OR_DEPARTMENT_OTHER): Payer: Self-pay

## 2021-04-01 ENCOUNTER — Emergency Department (HOSPITAL_BASED_OUTPATIENT_CLINIC_OR_DEPARTMENT_OTHER): Payer: BLUE CROSS/BLUE SHIELD

## 2021-04-01 ENCOUNTER — Emergency Department (HOSPITAL_BASED_OUTPATIENT_CLINIC_OR_DEPARTMENT_OTHER)
Admission: EM | Admit: 2021-04-01 | Discharge: 2021-04-01 | Disposition: A | Discharge status: Home / Self Care | Payer: BLUE CROSS/BLUE SHIELD | Attending: Emergency Medicine | Admitting: Emergency Medicine

## 2021-04-01 DIAGNOSIS — F17210 Nicotine dependence, cigarettes, uncomplicated: Secondary | ICD-10-CM | POA: Diagnosis not present

## 2021-04-01 DIAGNOSIS — R109 Unspecified abdominal pain: Secondary | ICD-10-CM | POA: Diagnosis not present

## 2021-04-01 DIAGNOSIS — G8929 Other chronic pain: Secondary | ICD-10-CM

## 2021-04-01 DIAGNOSIS — R1011 Right upper quadrant pain: Secondary | ICD-10-CM | POA: Diagnosis not present

## 2021-04-01 DIAGNOSIS — I10 Essential (primary) hypertension: Secondary | ICD-10-CM | POA: Insufficient documentation

## 2021-04-01 DIAGNOSIS — Z79899 Other long term (current) drug therapy: Secondary | ICD-10-CM | POA: Insufficient documentation

## 2021-04-01 DIAGNOSIS — D72829 Elevated white blood cell count, unspecified: Secondary | ICD-10-CM | POA: Insufficient documentation

## 2021-04-01 LAB — CBC WITH DIFFERENTIAL/PLATELET
Abs Immature Granulocytes: 0.08 10*3/uL — ABNORMAL HIGH (ref 0.00–0.07)
Basophils Absolute: 0.1 10*3/uL (ref 0.0–0.1)
Basophils Relative: 1 %
Eosinophils Absolute: 0.6 10*3/uL — ABNORMAL HIGH (ref 0.0–0.5)
Eosinophils Relative: 4 %
HCT: 43.3 % (ref 39.0–52.0)
Hemoglobin: 15.3 g/dL (ref 13.0–17.0)
Immature Granulocytes: 1 %
Lymphocytes Relative: 17 %
Lymphs Abs: 2.7 10*3/uL (ref 0.7–4.0)
MCH: 29.6 pg (ref 26.0–34.0)
MCHC: 35.3 g/dL (ref 30.0–36.0)
MCV: 83.8 fL (ref 80.0–100.0)
Monocytes Absolute: 1.2 10*3/uL — ABNORMAL HIGH (ref 0.1–1.0)
Monocytes Relative: 8 %
Neutro Abs: 11.6 10*3/uL — ABNORMAL HIGH (ref 1.7–7.7)
Neutrophils Relative %: 69 %
Platelets: 191 10*3/uL (ref 150–400)
RBC: 5.17 MIL/uL (ref 4.22–5.81)
RDW: 12.6 % (ref 11.5–15.5)
WBC: 16.3 10*3/uL — ABNORMAL HIGH (ref 4.0–10.5)
nRBC: 0 % (ref 0.0–0.2)

## 2021-04-01 LAB — COMPREHENSIVE METABOLIC PANEL
ALT: 22 U/L (ref 0–44)
AST: 17 U/L (ref 15–41)
Albumin: 4.1 g/dL (ref 3.5–5.0)
Alkaline Phosphatase: 68 U/L (ref 38–126)
Anion gap: 9 (ref 5–15)
BUN: 17 mg/dL (ref 6–20)
CO2: 24 mmol/L (ref 22–32)
Calcium: 9.2 mg/dL (ref 8.9–10.3)
Chloride: 106 mmol/L (ref 98–111)
Creatinine, Ser: 0.89 mg/dL (ref 0.61–1.24)
GFR, Estimated: >60 mL/min (ref >60)
Glucose, Bld: 116 mg/dL — ABNORMAL HIGH (ref 70–99)
Potassium: 3.3 mmol/L — ABNORMAL LOW (ref 3.5–5.1)
Sodium: 139 mmol/L (ref 135–145)
Total Bilirubin: 0.7 mg/dL (ref 0.3–1.2)
Total Protein: 7.2 g/dL (ref 6.5–8.1)

## 2021-04-01 LAB — LIPASE, BLOOD: Lipase: 31 U/L (ref 11–51)

## 2021-04-01 MED ORDER — HYDROMORPHONE HCL 1 MG/ML IJ SOLN
1.0000 mg | Freq: Once | INTRAMUSCULAR | Status: AC
  Administered 2021-04-01: 1 mg via INTRAVENOUS
  Filled 2021-04-01: qty 1

## 2021-04-01 MED ORDER — HYDROMORPHONE HCL 1 MG/ML IJ SOLN
1.5000 mg | Freq: Once | INTRAMUSCULAR | Status: AC
  Administered 2021-04-01: 1.5 mg via INTRAVENOUS
  Filled 2021-04-01: qty 2

## 2021-04-01 MED ORDER — HYDROMORPHONE HCL 1 MG/ML IJ SOLN: 1.0000 mg | Freq: Once | INTRAMUSCULAR | Status: DC

## 2021-04-01 MED ORDER — LACTATED RINGERS IV SOLN: INTRAVENOUS | Status: DC

## 2021-04-01 MED ORDER — LORAZEPAM 2 MG/ML IJ SOLN
0.5000 mg | Freq: Once | INTRAMUSCULAR | Status: AC
  Administered 2021-04-01: 0.5 mg via INTRAVENOUS
  Filled 2021-04-01: qty 1

## 2021-04-01 MED ORDER — LACTATED RINGERS IV BOLUS
1000.0000 mL | Freq: Once | INTRAVENOUS | Status: AC
  Administered 2021-04-01: 1000 mL via INTRAVENOUS

## 2021-04-01 NOTE — ED Provider Notes (Signed)
[facility] EMERGENCY DEPT Provider Note   CSN: [id number] Arrival date & time: 04/01/21  0759     History Chief Complaint  Patient presents with   Abdominal Pain    [name] is a 45 y.o. male.  45 year old male with history of chronic pain secondary to pancreatitis presents with worsening right upper quadrant abdominal pain.  Seen 2 days ago for similar symptoms and work-up there was negative.  Has had a ERCP about a month ago for having biliary stones.  Patient states that this feels different than his normal chronic pancreatic pain.  Notes he has had bilious emesis.  Denies any fever.  No bloody stools.  Pain unrelieved with his home opiates.  Has an appoint to see his GI doctor next week      Past Medical History:  Diagnosis Date   Chronic abdominal pain    Diverticulitis    Drug-seeking behavior    Hypertension    Kidney stones    Pancreatitis, chronic ([place]) 05/28/2005    Patient Active Problem List   Diagnosis Date Noted   Acute cholangitis 03/03/2021   Drug-seeking behavior 11/25/2020   Diverticulitis 11/25/2020   Kidney stones 11/25/2020   Hyperlipidemia, mixed 03/01/2020   Abnormal magnetic resonance cholangiopancreatography (MRCP)    Choledocholithiasis    E coli bacteremia    Bacteremia due to Enterobacter species    Bacteremia 12/27/2019   QT prolongation 12/27/2019   Nausea without vomiting    Chronic pain disorder 08/29/2018   Back pain, chronic 08/29/2018   Nausea and vomiting in adult 02/25/2018   Hypertension, essential, benign 01/18/2018   Chronic biliary pancreatitis ([place]) 01/18/2018   Chronic abdominal pain 01/14/2018    Past Surgical History:  Procedure Laterality Date   APPENDECTOMY     CHOLECYSTECTOMY     ERCP N/A 12/30/2019   Procedure: ENDOSCOPIC RETROGRADE CHOLANGIOPANCREATOGRAPHY (ERCP);  Surgeon: Milus Banister, MD;  Location: [facility];  Service: Endoscopy;  Laterality: N/A;   ERCP N/A 03/04/2021    Procedure: ENDOSCOPIC RETROGRADE CHOLANGIOPANCREATOGRAPHY (ERCP);  Surgeon: Carol Ada, MD;  Location: [facility];  Service: Endoscopy;  Laterality: N/A;   ERCP W/ METAL STENT PLACEMENT     KIDNEY STONE SURGERY     REMOVAL OF STONES  12/30/2019   Procedure: REMOVAL OF STONES;  Surgeon: Milus Banister, MD;  Location: [facility];  Service: Endoscopy;;   REMOVAL OF STONES  03/04/2021   Procedure: REMOVAL OF STONES;  Surgeon: Carol Ada, MD;  Location: [facility];  Service: Endoscopy;;   SPHINCTEROTOMY  03/04/2021   Procedure: Joan Mayans;  Surgeon: Carol Ada, MD;  Location: [facility];  Service: Endoscopy;;       Family History  Problem Relation Age of Onset   Cancer Mother    Early death Mother    Hypertension Mother    Heart failure Father    Hypertension Father    Heart disease Father    Hyperlipidemia Father    Pancreatic cancer Paternal Grandmother        possibly   Pancreatic cancer Paternal Aunt    Colon cancer Neg Hx     Social History   Tobacco Use   Smoking status: Every Day    Packs/day: 0.50    Types: Cigarettes   Smokeless tobacco: Never  Vaping Use   Vaping Use: Never used  Substance Use Topics   Alcohol use: Not Currently    Comment: rare   Drug use: No    Home Medications Prior  to Admission medications   Medication Sig Start Date End Date Taking? Authorizing Provider  amLODipine-benazepril (LOTREL) 5-20 MG capsule TAKE 1 CAPSULE BY MOUTH EVERY DAY Patient taking differently: Take 1 capsule by mouth daily. 11/12/20   [place], Betty G, MD  benazepril (LOTENSIN) 20 MG tablet Take 1 tablet (20 mg total) by mouth daily. 03/14/21   [place], Betty G, MD  fluticasone (FLONASE) 50 MCG/ACT nasal spray Place 1 spray into both nostrils 2 (two) times daily. Patient taking differently: Place 1 spray into both nostrils daily as needed for allergies. 04/03/19   [place], Betty G, MD  metoprolol succinate (TOPROL-XL) 100 MG 24 hr tablet TAKE 1 TABLET (100  MG TOTAL) BY MOUTH DAILY. TAKE WITH OR IMMEDIATELY FOLLOWING A MEAL. 10/25/20   [place], Betty G, MD  naloxone [facility]) nasal spray 4 mg/0.1 mL 1 spray in each nostril x 1 if opioid overdose. Patient taking differently: Place 1 spray into the nose once as needed (over dose). 1 spray in each nostril x 1 if opioid overdose. 06/08/20   [place], Betty G, MD  ondansetron (ZOFRAN ODT) 4 MG disintegrating tablet Place 1 tablet under the tongue every 6 to 8 hours PRN nausea 03/09/21   Noralyn Pick, NP  Oxycodone HCl 10 MG TABS Take 1 tablet (10 mg total) by mouth every 6 (six) hours as needed. 03/17/21   Raulkar, Clide Deutscher, MD  pantoprazole (PROTONIX) 40 MG tablet Take 1 tablet (40 mg total) by mouth daily. Patient taking differently: Take 40 mg by mouth daily as needed (heart burn). 12/25/20   Lajean Saver, MD  promethazine (PHENERGAN) 25 MG tablet TAKE 1 TABLET BY MOUTH EVERY 12 HOURS AS NEEDED 03/17/21   [place], Betty G, MD    Allergies    Cortisone, Eggs or egg-derived products, Ketorolac, Ketorolac tromethamine, Haldol [haloperidol lactate], Haloperidol, Hydrocortisone, Iodinated diagnostic agents, Ketorolac tromethamine, Reglan [metoclopramide], and Compazine [prochlorperazine]  Review of Systems   Review of Systems  All other systems reviewed and are negative.  Physical Exam Updated Vital Signs BP (!) 149/92 (BP Location: Right Arm)   Pulse 80   Temp 98.5 F (36.9 C) (Oral)   Resp 18   SpO2 97%   Physical Exam Vitals and nursing note reviewed.  Constitutional:      General: He is not in acute distress.    Appearance: Normal appearance. He is well-developed. He is not toxic-appearing.  HENT:     Head: Normocephalic and atraumatic.  Eyes:     General: Lids are normal.     Conjunctiva/sclera: Conjunctivae normal.     Pupils: Pupils are equal, round, and reactive to light.  Neck:     Thyroid: No thyroid mass.     Trachea: No tracheal deviation.  Cardiovascular:     Rate  and Rhythm: Normal rate and regular rhythm.     Heart sounds: Normal heart sounds. No murmur heard.   No gallop.  Pulmonary:     Effort: Pulmonary effort is normal. No respiratory distress.     Breath sounds: Normal breath sounds. No stridor. No decreased breath sounds, wheezing, rhonchi or rales.  Abdominal:     General: There is no distension.     Palpations: Abdomen is soft.     Tenderness: There is abdominal tenderness in the right upper quadrant. There is no guarding or rebound.  Musculoskeletal:        General: No tenderness. Normal range of motion.     Cervical back: Normal range of motion  and neck supple.  Skin:    General: Skin is warm and dry.     Findings: No abrasion or rash.  Neurological:     Mental Status: He is alert and oriented to person, place, and time. Mental status is at baseline.     GCS: GCS eye subscore is 4. GCS verbal subscore is 5. GCS motor subscore is 6.     Cranial Nerves: No cranial nerve deficit.     Sensory: No sensory deficit.     Motor: Motor function is intact.  Psychiatric:        Attention and Perception: Attention normal.        Speech: Speech normal.        Behavior: Behavior normal.    ED Results / Procedures / Treatments   Labs (all labs ordered are listed, but only abnormal results are displayed) Labs Reviewed  CBC WITH DIFFERENTIAL/PLATELET  COMPREHENSIVE METABOLIC PANEL  LIPASE, BLOOD    EKG None  Radiology No results found.  Procedures Procedures   Medications Ordered in ED Medications  lactated ringers bolus 1,000 mL (has no administration in time range)  lactated ringers infusion (has no administration in time range)  LORazepam (ATIVAN) injection 0.5 mg (has no administration in time range)  HYDROmorphone (DILAUDID) injection 1.5 mg (has no administration in time range)    ED Course  I have reviewed the triage vital signs and the nursing notes.  Pertinent labs & imaging results that were available during my care  of the patient were reviewed by me and considered in my medical decision making (see chart for details).    MDM Rules/Calculators/A&P                           Patient with mild leukocytosis on his CBC.  CT without acute findings.  LFTs normal.  Medicated for pain here and feels better.  Patient encouraged to keep his follow-up with his GI doctor for next week Final Clinical Impression(s) / ED Diagnoses Final diagnoses:  None    Rx / DC Orders ED Discharge Orders     None        Lacretia Leigh, MD 04/01/21 0945

## 2021-04-01 NOTE — ED Triage Notes (Signed)
/  o right-sided mid and upper abd. Pain, plus some vomiting, x 3 days. He mentions that he has had  a recent ERCP. He tells [place] that "this pain is different than my pancrease pain". His wife is with him.

## 2021-04-02 ENCOUNTER — Emergency Department (HOSPITAL_COMMUNITY)
Admission: EM | Admit: 2021-04-02 | Discharge: 2021-04-02 | Disposition: A | Discharge status: Home / Self Care | Payer: BLUE CROSS/BLUE SHIELD | Attending: Emergency Medicine | Admitting: Emergency Medicine

## 2021-04-02 ENCOUNTER — Encounter (HOSPITAL_COMMUNITY): Payer: Self-pay | Admitting: Emergency Medicine

## 2021-04-02 DIAGNOSIS — R1011 Right upper quadrant pain: Secondary | ICD-10-CM | POA: Insufficient documentation

## 2021-04-02 DIAGNOSIS — R1110 Vomiting, unspecified: Secondary | ICD-10-CM | POA: Insufficient documentation

## 2021-04-02 DIAGNOSIS — Z79899 Other long term (current) drug therapy: Secondary | ICD-10-CM | POA: Insufficient documentation

## 2021-04-02 DIAGNOSIS — I10 Essential (primary) hypertension: Secondary | ICD-10-CM | POA: Diagnosis not present

## 2021-04-02 DIAGNOSIS — F17210 Nicotine dependence, cigarettes, uncomplicated: Secondary | ICD-10-CM | POA: Diagnosis not present

## 2021-04-02 DIAGNOSIS — G8929 Other chronic pain: Secondary | ICD-10-CM

## 2021-04-02 LAB — CBC
HCT: 44.8 % (ref 39.0–52.0)
Hemoglobin: 15.9 g/dL (ref 13.0–17.0)
MCH: 30.1 pg (ref 26.0–34.0)
MCHC: 35.5 g/dL (ref 30.0–36.0)
MCV: 84.7 fL (ref 80.0–100.0)
Platelets: 200 10*3/uL (ref 150–400)
RBC: 5.29 MIL/uL (ref 4.22–5.81)
RDW: 12.6 % (ref 11.5–15.5)
WBC: 12.5 10*3/uL — ABNORMAL HIGH (ref 4.0–10.5)
nRBC: 0 % (ref 0.0–0.2)

## 2021-04-02 LAB — COMPREHENSIVE METABOLIC PANEL
ALT: 26 U/L (ref 0–44)
AST: 22 U/L (ref 15–41)
Albumin: 4.4 g/dL (ref 3.5–5.0)
Alkaline Phosphatase: 88 U/L (ref 38–126)
Anion gap: 11 (ref 5–15)
BUN: 16 mg/dL (ref 6–20)
CO2: 21 mmol/L — ABNORMAL LOW (ref 22–32)
Calcium: 9.5 mg/dL (ref 8.9–10.3)
Chloride: 107 mmol/L (ref 98–111)
Creatinine, Ser: 0.65 mg/dL (ref 0.61–1.24)
GFR, Estimated: >60 mL/min (ref >60)
Glucose, Bld: 95 mg/dL (ref 70–99)
Potassium: 3.4 mmol/L — ABNORMAL LOW (ref 3.5–5.1)
Sodium: 139 mmol/L (ref 135–145)
Total Bilirubin: 0.5 mg/dL (ref 0.3–1.2)
Total Protein: 7.7 g/dL (ref 6.5–8.1)

## 2021-04-02 LAB — LIPASE, BLOOD: Lipase: 46 U/L (ref 11–51)

## 2021-04-02 MED ORDER — LACTATED RINGERS IV SOLN: INTRAVENOUS | Status: DC

## 2021-04-02 MED ORDER — HYDROMORPHONE HCL 1 MG/ML IJ SOLN
1.0000 mg | Freq: Once | INTRAMUSCULAR | Status: AC
  Administered 2021-04-02: 1 mg via INTRAVENOUS
  Filled 2021-04-02: qty 1

## 2021-04-02 MED ORDER — ONDANSETRON HCL 4 MG/2ML IJ SOLN
4.0000 mg | Freq: Once | INTRAMUSCULAR | Status: AC
  Administered 2021-04-02: 4 mg via INTRAVENOUS
  Filled 2021-04-02: qty 2

## 2021-04-02 MED ORDER — LORAZEPAM 2 MG/ML IJ SOLN
0.5000 mg | Freq: Once | INTRAMUSCULAR | Status: AC
  Administered 2021-04-02: 0.5 mg via INTRAVENOUS
  Filled 2021-04-02: qty 1

## 2021-04-02 MED ORDER — LACTATED RINGERS IV BOLUS
1000.0000 mL | Freq: Once | INTRAVENOUS | Status: AC
  Administered 2021-04-02: 1000 mL via INTRAVENOUS

## 2021-04-02 NOTE — ED Provider Notes (Signed)
[facility] DEPT Provider Note   CSN: [id number] Arrival date & time: 04/02/21  5465     History Chief Complaint  Patient presents with   Vomiting    [name] is a 45 y.o. male.  45 year old male with history of chronic abdominal pain and was seen by me yesterday for same presents with ongoing symptoms.  States he cannot keep anything down.  Pain is been going on for about a month since he had his ERCP.  He is scheduled to see GI in a few days.  Has not been able take his home medication.  States that since I saw him, he has vomited multiple times.  Denies any fever.  Pain is similar      Past Medical History:  Diagnosis Date   Chronic abdominal pain    Diverticulitis    Drug-seeking behavior    Hypertension    Kidney stones    Pancreatitis, chronic ([place]) 05/28/2005    Patient Active Problem List   Diagnosis Date Noted   Acute cholangitis 03/03/2021   Drug-seeking behavior 11/25/2020   Diverticulitis 11/25/2020   Kidney stones 11/25/2020   Hyperlipidemia, mixed 03/01/2020   Abnormal magnetic resonance cholangiopancreatography (MRCP)    Choledocholithiasis    E coli bacteremia    Bacteremia due to Enterobacter species    Bacteremia 12/27/2019   QT prolongation 12/27/2019   Nausea without vomiting    Chronic pain disorder 08/29/2018   Back pain, chronic 08/29/2018   Nausea and vomiting in adult 02/25/2018   Hypertension, essential, benign 01/18/2018   Chronic biliary pancreatitis ([place]) 01/18/2018   Chronic abdominal pain 01/14/2018    Past Surgical History:  Procedure Laterality Date   APPENDECTOMY     CHOLECYSTECTOMY     ERCP N/A 12/30/2019   Procedure: ENDOSCOPIC RETROGRADE CHOLANGIOPANCREATOGRAPHY (ERCP);  Surgeon: Milus Banister, MD;  Location: [facility];  Service: Endoscopy;  Laterality: N/A;   ERCP N/A 03/04/2021   Procedure: ENDOSCOPIC RETROGRADE CHOLANGIOPANCREATOGRAPHY (ERCP);  Surgeon: Carol Ada, MD;   Location: [facility];  Service: Endoscopy;  Laterality: N/A;   ERCP W/ METAL STENT PLACEMENT     KIDNEY STONE SURGERY     REMOVAL OF STONES  12/30/2019   Procedure: REMOVAL OF STONES;  Surgeon: Milus Banister, MD;  Location: [facility];  Service: Endoscopy;;   REMOVAL OF STONES  03/04/2021   Procedure: REMOVAL OF STONES;  Surgeon: Carol Ada, MD;  Location: [facility];  Service: Endoscopy;;   SPHINCTEROTOMY  03/04/2021   Procedure: Joan Mayans;  Surgeon: Carol Ada, MD;  Location: [facility];  Service: Endoscopy;;       Family History  Problem Relation Age of Onset   Cancer Mother    Early death Mother    Hypertension Mother    Heart failure Father    Hypertension Father    Heart disease Father    Hyperlipidemia Father    Pancreatic cancer Paternal Grandmother        possibly   Pancreatic cancer Paternal Aunt    Colon cancer Neg Hx     Social History   Tobacco Use   Smoking status: Every Day    Packs/day: 0.50    Types: Cigarettes   Smokeless tobacco: Never  Vaping Use   Vaping Use: Never used  Substance Use Topics   Alcohol use: Not Currently    Comment: rare   Drug use: No    Home Medications Prior to Admission medications   Medication Sig Start  Date End Date Taking? Authorizing Provider  amLODipine-benazepril (LOTREL) 5-20 MG capsule TAKE 1 CAPSULE BY MOUTH EVERY DAY Patient taking differently: Take 1 capsule by mouth daily. 11/12/20   Martinique, Betty G, MD  benazepril (LOTENSIN) 20 MG tablet Take 1 tablet (20 mg total) by mouth daily. 03/14/21   Martinique, Betty G, MD  fluticasone (FLONASE) 50 MCG/ACT nasal spray Place 1 spray into both nostrils 2 (two) times daily. Patient taking differently: Place 1 spray into both nostrils daily as needed for allergies. 04/03/19   Martinique, Betty G, MD  metoprolol succinate (TOPROL-XL) 100 MG 24 hr tablet TAKE 1 TABLET (100 MG TOTAL) BY MOUTH DAILY. TAKE WITH OR IMMEDIATELY FOLLOWING A MEAL. 10/25/20   Martinique, Betty G,  MD  naloxone [facility]) nasal spray 4 mg/0.1 mL 1 spray in each nostril x 1 if opioid overdose. Patient taking differently: Place 1 spray into the nose once as needed (over dose). 1 spray in each nostril x 1 if opioid overdose. 06/08/20   Martinique, Betty G, MD  ondansetron (ZOFRAN ODT) 4 MG disintegrating tablet Place 1 tablet under the tongue every 6 to 8 hours PRN nausea 03/09/21   Noralyn Pick, NP  Oxycodone HCl 10 MG TABS Take 1 tablet (10 mg total) by mouth every 6 (six) hours as needed. 03/17/21   Raulkar, Clide Deutscher, MD  pantoprazole (PROTONIX) 40 MG tablet Take 1 tablet (40 mg total) by mouth daily. Patient taking differently: Take 40 mg by mouth daily as needed (heart burn). 12/25/20   Lajean Saver, MD  promethazine (PHENERGAN) 25 MG tablet TAKE 1 TABLET BY MOUTH EVERY 12 HOURS AS NEEDED 03/17/21   Martinique, Betty G, MD    Allergies    Cortisone, Eggs or egg-derived products, Ketorolac, Ketorolac tromethamine, Haldol [haloperidol lactate], Haloperidol, Hydrocortisone, Iodinated diagnostic agents, Ketorolac tromethamine, Reglan [metoclopramide], and Compazine [prochlorperazine]  Review of Systems   Review of Systems  All other systems reviewed and are negative.  Physical Exam Updated Vital Signs BP (!) 151/90   Pulse 68   Temp 98.4 F (36.9 C) (Oral)   Resp 16   Ht 1.854 m (6\' 1" )   Wt 93 kg   SpO2 97%   BMI 27.05 kg/m   Physical Exam Vitals and nursing note reviewed.  Constitutional:      General: He is not in acute distress.    Appearance: Normal appearance. He is well-developed. He is not toxic-appearing.  HENT:     Head: Normocephalic and atraumatic.  Eyes:     General: Lids are normal.     Conjunctiva/sclera: Conjunctivae normal.     Pupils: Pupils are equal, round, and reactive to light.  Neck:     Thyroid: No thyroid mass.     Trachea: No tracheal deviation.  Cardiovascular:     Rate and Rhythm: Normal rate and regular rhythm.     Heart sounds: Normal  heart sounds. No murmur heard.   No gallop.  Pulmonary:     Effort: Pulmonary effort is normal. No respiratory distress.     Breath sounds: Normal breath sounds. No stridor. No decreased breath sounds, wheezing, rhonchi or rales.  Abdominal:     General: There is no distension.     Palpations: Abdomen is soft.     Tenderness: There is abdominal tenderness in the right upper quadrant. There is no guarding or rebound.  Musculoskeletal:        General: No tenderness. Normal range of motion.     Cervical  back: Normal range of motion and neck supple.  Skin:    General: Skin is warm and dry.     Findings: No abrasion or rash.  Neurological:     Mental Status: He is alert and oriented to person, place, and time. Mental status is at baseline.     GCS: GCS eye subscore is 4. GCS verbal subscore is 5. GCS motor subscore is 6.     Cranial Nerves: No cranial nerve deficit.     Sensory: No sensory deficit.     Motor: Motor function is intact.  Psychiatric:        Attention and Perception: Attention normal.        Speech: Speech normal.        Behavior: Behavior normal.    ED Results / Procedures / Treatments   Labs (all labs ordered are listed, but only abnormal results are displayed) Labs Reviewed  COMPREHENSIVE METABOLIC PANEL - Abnormal; Notable for the following components:      Result Value   Potassium 3.4 (*)    CO2 21 (*)    All other components within normal limits  CBC - Abnormal; Notable for the following components:   WBC 12.5 (*)    All other components within normal limits  LIPASE, BLOOD  URINALYSIS, ROUTINE W REFLEX MICROSCOPIC    EKG None  Radiology CT Abdomen Pelvis Wo Contrast  Result Date: 04/01/2021 CLINICAL DATA:  Right-sided abdominal pain and vomiting for 3 days. Personal history of pancreatitis. EXAM: CT ABDOMEN AND PELVIS WITHOUT CONTRAST TECHNIQUE: Multidetector CT imaging of the abdomen and pelvis was performed following the standard protocol without IV  contrast. COMPARISON:  03/11/2021 FINDINGS: Lower chest: No acute findings. Hepatobiliary: No mass visualized on this unenhanced exam. Prior cholecystectomy. Pneumobilia again noted, consistent with prior sphincterotomy. Pancreas: No mass or inflammatory process visualized on this unenhanced exam. No evidence of pancreatic calcifications. Spleen:  Within normal limits in size. Adrenals/Urinary tract: No evidence of urolithiasis or hydronephrosis. Unremarkable unopacified urinary bladder. Stomach/Bowel: No evidence of obstruction, inflammatory process, or abnormal fluid collections. Vascular/Lymphatic: No pathologically enlarged lymph nodes identified. No evidence of abdominal aortic aneurysm. Aortic atherosclerotic calcification noted. Reproductive:  No mass or other significant abnormality. Other:  None. Musculoskeletal:  No suspicious bone lesions identified. IMPRESSION: No radiographic evidence of pancreatitis or other acute findings. Stable pneumobilia, consistent with prior sphincterotomy. Aortic Atherosclerosis (ICD10-I70.0). Electronically Signed   By: Marlaine Hind M.D.   On: 04/01/2021 09:32    Procedures Procedures   Medications Ordered in ED Medications  lactated ringers bolus 1,000 mL (has no administration in time range)  lactated ringers infusion (has no administration in time range)  HYDROmorphone (DILAUDID) injection 1 mg (has no administration in time range)  ondansetron (ZOFRAN) injection 4 mg (has no administration in time range)  LORazepam (ATIVAN) injection 0.5 mg (has no administration in time range)    ED Course  I have reviewed the triage vital signs and the nursing notes.  Pertinent labs & imaging results that were available during my care of the patient were reviewed by me and considered in my medical decision making (see chart for details).    MDM Rules/Calculators/A&P                           Patient treated for chronic pain here.  Labs are reassuring again.  Have  instructed him that he would need to follow-up with his pain management specialist as  we cannot treat chronic pain in the ER.  He will keep his GI referral for next week Final Clinical Impression(s) / ED Diagnoses Final diagnoses:  None    Rx / DC Orders ED Discharge Orders     None        Lacretia Leigh, MD 04/02/21 0840

## 2021-04-02 NOTE — ED Triage Notes (Signed)
Patient presents complaining of continued inability to tolerate PO. Patient states at home medications are not helping.

## 2021-04-03 ENCOUNTER — Encounter: Payer: Self-pay | Admitting: Family Medicine

## 2021-04-03 ENCOUNTER — Other Ambulatory Visit: Payer: Self-pay

## 2021-04-03 ENCOUNTER — Ambulatory Visit (INDEPENDENT_AMBULATORY_CARE_PROVIDER_SITE_OTHER): Payer: BLUE CROSS/BLUE SHIELD | Admitting: Family Medicine

## 2021-04-03 ENCOUNTER — Telehealth: Payer: Self-pay

## 2021-04-03 VITALS — BP 130/80 | HR 97 | Resp 16 | Ht 73.0 in | Wt 205.0 lb

## 2021-04-03 DIAGNOSIS — R112 Nausea with vomiting, unspecified: Secondary | ICD-10-CM

## 2021-04-03 DIAGNOSIS — D72829 Elevated white blood cell count, unspecified: Secondary | ICD-10-CM

## 2021-04-03 DIAGNOSIS — E876 Hypokalemia: Secondary | ICD-10-CM | POA: Diagnosis not present

## 2021-04-03 DIAGNOSIS — G894 Chronic pain syndrome: Secondary | ICD-10-CM

## 2021-04-03 DIAGNOSIS — I10 Essential (primary) hypertension: Secondary | ICD-10-CM

## 2021-04-03 MED ORDER — OXYCODONE HCL 5 MG PO TABS: 5.0000 mg | ORAL_TABLET | Freq: Three times a day (TID) | ORAL | 0 refills | Status: DC | PRN

## 2021-04-03 MED ORDER — ONDANSETRON 4 MG PO TBDP: ORAL_TABLET | ORAL | 0 refills | Status: DC

## 2021-04-03 NOTE — Patient Instructions (Addendum)
A few things to remember from today's visit:  Chronic pain disorder - Plan: oxyCODONE (OXY IR/ROXICODONE) 5 MG immediate release tablet, DISCONTINUED: oxyCODONE (OXY IR/ROXICODONE) 5 MG immediate release tablet  Hypertension, essential, benign  Nausea and vomiting in adult  If you need refills please call your pharmacy. Do not use My Chart to request refills or for acute issues that need immediate attention.   We are going to continue weaning off Oxycodone, resume 5 mg 3 times per day for 10 days then 2 times per day for 10 days,then once daily for 10 days,then every other day for 10 days,and every 2 days for 9 days. A list of providers for pain management has been included in summery.  Keep appt with gastro. You had a refill available for phenergan at your pharmacy.  We you ran out of Amlodipine-Benazepril let me know, for now continue taking extra Benazepril 20 mg.  Please be sure medication list is accurate. If a new problem present, please set up appointment sooner than planned today.        This is a list of pain clinics/chronic pain management in the area.  -[facility] [phone].  -[facility] [phone].  -[facility] [phone].  -[facility] [phone]  -[facility]  [phone]  Dr. Alysia Penna [phone] [place], MD [phone]  -[facility] [place], MD [facility] [phone]  Suella Broad [phone].

## 2021-04-03 NOTE — Telephone Encounter (Signed)
There was never any contact with our office about disposing of his medication.  We would not require his medication be disposed of, even though we are not going to continue to prescribe for him. He would have it to cover him until a decision could be made by his primary care provider to determine if she would prescribe for him or refer him out to another pain management clinic. [name] did not call our office to ask what to do with his medication and we would not take the medication from him. We have a strict protocol for disposal of discontinued narcotics requiring 2 staff members as well as the patient to witness the disposal in our stericycle narcotic disposal system. This did not happen. He was informed by MyChart that [name] would not prescribe after his drug screen had an unprescribed controlled medication present, and that was our only contact with him until his mychart messages this morning.

## 2021-04-03 NOTE — Assessment & Plan Note (Signed)
BP is better controlled. Continue Amlodipine 5 mg daily and Benazepril 40 mg daily. Continue monitoring BP regularly and low salt diet.

## 2021-04-03 NOTE — Assessment & Plan Note (Signed)
We discussed current guideline in regard to chronic opioid use for pain management. He states that he has no Oxycodone left, took left prescription to Dr Ranell Patrick according to pt, as he was requested to do so. He is not having withdrawal symptoms at this time despite the fact he has not taken Oxycodone for about 5 days. He understands I cannot continue managing this problem. Will continue weaning off medication,plan written on AVS. A list of chronic pain providers in the are given.  He voices understanding and agrees with plan.

## 2021-04-03 NOTE — Progress Notes (Signed)
HPI: [name] is a 45 y.o. male with history of hypertension, chronic abdominal pain, chronic nausea and vomiting, daily prolongation, choledocholithiasis s/p endoscopic retrograde cholangiopancreatography,sphincterectomy, and removal of stones here today to follow on recent ED visit. He has been evaluated in the ED on 03/30/2021, 04/01/2021, and 04/02/21 for abdominal pain. Yesterday he was treated with Lorazepam 0.5 mg , Dilaudid,and Zofran IV.  Lab Results  Component Value Date   LIPASE 46 04/02/2021   Lab Results  Component Value Date   ALT 26 04/02/2021   AST 22 04/02/2021   ALKPHOS 88 04/02/2021   BILITOT 0.5 04/02/2021   He was evaluated for chronic pain management on 03/17/2021. He is on oxycodone 10 mg every 6 hours as needed was prescribed. Unfortunately he received a letter from Dr Aline August office to let him know that Tramadol was in his urine tox, and therefore opioids will not be prescribed. He absolutely denies taking any other controlled medication that has not been prescribed. States that he may have received this medication when he was in the ER, states that he received 2 tabs, not sure what they were. Reviewing ED note prior to pain management visit, he received IV Zofran, Morphine, and Dilaudid.  States that he "may have" taken Tramadol by accident, his wife may have had it in a bottle in her purse and he may have taken it thinking it was Omeprazole.  Requesting another Rx for Oxycodone, states that he was asked to returned Oxycodone back to the office and he did, so he has no Oxycodone left.  Labs in the ED showed mild hypoK+.  Lab Results  Component Value Date   CREATININE 0.65 04/02/2021   BUN 16 04/02/2021   NA 139 04/02/2021   K 3.4 (L) 04/02/2021   CL 107 04/02/2021   CO2 21 (L) 04/02/2021   He also needs a prescription for Zofran and phenergan to treat nausea and vomiting. Needs FMLA completed. He has an appt with GI scheduled.  Concerned  about mildly elevated WBC's. Problem has been intermittent since 2018.  Lab Results  Component Value Date   WBC 12.5 (H) 04/02/2021   HGB 15.9 04/02/2021   HCT 44.8 04/02/2021   MCV 84.7 04/02/2021   PLT 200 04/02/2021  Negative for fever,chills,night sweats,or abnormal wt loss.  BP readings at home around BP we got today. In the ER was 132/76. Last visit Benazepril was increased from 20 mg to 40 mg. He is on Amlodipine -Benazepril 5-20 mg and taking Benazepril 20 mg. Side effects:None.  Negative for unusual or severe headache, visual changes, exertional chest pain, dyspnea,  focal weakness, or edema.  Review of Systems  Constitutional:  Positive for fatigue. Negative for chills and fever.  Respiratory:  Negative for cough, shortness of breath and wheezing.   Cardiovascular:  Negative for chest pain, palpitations and leg swelling.  Musculoskeletal:  Negative for gait problem and myalgias.  Skin:  Negative for pallor and rash.  Rest see pertinent positives and negatives per HPI.  Current Outpatient Medications on File Prior to Visit  Medication Sig Dispense Refill   amLODipine-benazepril (LOTREL) 5-20 MG capsule TAKE 1 CAPSULE BY MOUTH EVERY DAY (Patient taking differently: Take 1 capsule by mouth daily.) 90 capsule 2   benazepril (LOTENSIN) 20 MG tablet Take 1 tablet (20 mg total) by mouth daily. 30 tablet 1   fluticasone (FLONASE) 50 MCG/ACT nasal spray Place 1 spray into both nostrils 2 (two) times daily. (Patient taking differently: Place  1 spray into both nostrils daily as needed for allergies.) 16 g 2   metoprolol succinate (TOPROL-XL) 100 MG 24 hr tablet TAKE 1 TABLET (100 MG TOTAL) BY MOUTH DAILY. TAKE WITH OR IMMEDIATELY FOLLOWING A MEAL. 90 tablet 1   naloxone (NARCAN) nasal spray 4 mg/0.1 mL 1 spray in each nostril x 1 if opioid overdose. (Patient taking differently: Place 1 spray into the nose once as needed (over dose). 1 spray in each nostril x 1 if opioid overdose.) 1  each 1   pantoprazole (PROTONIX) 40 MG tablet Take 1 tablet (40 mg total) by mouth daily. (Patient taking differently: Take 40 mg by mouth daily as needed (heart burn).) 30 tablet 0   promethazine (PHENERGAN) 25 MG tablet TAKE 1 TABLET BY MOUTH EVERY 12 HOURS AS NEEDED 45 tablet 1   No current facility-administered medications on file prior to visit.    Past Medical History:  Diagnosis Date   Chronic abdominal pain    Diverticulitis    Drug-seeking behavior    Hypertension    Kidney stones    Pancreatitis, chronic ([place]) 05/28/2005   Allergies  Allergen Reactions   Cortisone Other (See Comments)    drops potassium level "bottoms out" potassium level drops potassium level Bottoms out potassium  "bottoms out" potassium level Other reaction(s): Other (See Comments) Bottoms out potassium   Eggs Or Egg-Derived Products Hives and Other (See Comments)    Rash  Rash   Ketorolac Hives and Other (See Comments)    Hives, slightly labored breathing  Hives, slightly labored breathing   Ketorolac Tromethamine Hives and Shortness Of Breath   Haldol [Haloperidol Lactate] Other (See Comments)    "jittery"    Haloperidol Other (See Comments)    "jittery" "jittery"    Hydrocortisone Hives    Also drops potassium level   Iodinated Diagnostic Agents Nausea And Vomiting and Other (See Comments)    Hives Hives Hives Hives   Ketorolac Tromethamine Hives   Reglan [Metoclopramide]     Face "draws" and gets figgety   Compazine [Prochlorperazine] Other (See Comments)    Dystonic reaction    Social History   Socioeconomic History   Marital status: Married    Spouse name: Not on file   Number of children: Not on file   Years of education: Not on file   Highest education level: Some college, no degree  Occupational History   Not on file  Tobacco Use   Smoking status: Every Day    Packs/day: 0.50    Types: Cigarettes   Smokeless tobacco: Never  Vaping Use   Vaping Use: Never  used  Substance and Sexual Activity   Alcohol use: Not Currently    Comment: rare   Drug use: No   Sexual activity: Not on file  Other Topics Concern   Not on file  Social History Narrative   Not on file   Social Determinants of Health   Financial Resource Strain: Low Risk    Difficulty of Paying Living Expenses: Not hard at all  Food Insecurity: No Food Insecurity   Worried About Charity fundraiser in the Last Year: Never true   Ran Out of Food in the Last Year: Never true  Transportation Needs: No Transportation Needs   Lack of Transportation (Medical): No   Lack of Transportation (Non-Medical): No  Physical Activity: Insufficiently Active   Days of Exercise per Week: 1 day   Minutes of Exercise per Session: 20 min  Stress:  No Stress Concern Present   Feeling of Stress : Only a little  Social Connections: Engineer, building services of Communication with Friends and Family: Three times a week   Frequency of Social Gatherings with Friends and Family: Once a week   Attends Religious Services: More than 4 times per year   Active Member of [employer] or Organizations: Yes   Attends Archivist Meetings: More than 4 times per year   Marital Status: Married    Vitals:   04/03/21 0810  BP: 130/80  Pulse: 97  Resp: 16  SpO2: 98%   Body mass index is 27.05 kg/m.  Physical Exam Vitals and nursing note reviewed.  Constitutional:      General: He is not in acute distress.    Appearance: He is well-developed.  HENT:     Head: Normocephalic and atraumatic.     Mouth/Throat:     Mouth: Mucous membranes are moist.     Pharynx: Oropharynx is clear.  Eyes:     Conjunctiva/sclera: Conjunctivae normal.  Cardiovascular:     Rate and Rhythm: Normal rate and regular rhythm.     Pulses:          Posterior tibial pulses are 2+ on the right side and 2+ on the left side.     Heart sounds: No murmur heard. Pulmonary:     Effort: Pulmonary effort is normal. No respiratory  distress.     Breath sounds: Normal breath sounds.  Abdominal:     Palpations: Abdomen is soft. There is no hepatomegaly or mass.     Tenderness: There is abdominal tenderness in the right upper quadrant. There is no guarding or rebound.  Lymphadenopathy:     Cervical: No cervical adenopathy.  Skin:    General: Skin is warm.     Findings: No erythema or rash.  Neurological:     Mental Status: He is alert and oriented to person, place, and time.     Cranial Nerves: No cranial nerve deficit.     Gait: Gait normal.  Psychiatric:        Mood and Affect: Mood is anxious.     Comments: Seems restless.   ASSESSMENT AND PLAN:  Mr.Yutaka was seen today for hospitalization follow-up.  Diagnoses and all orders for this visit: Orders Placed This Encounter  Procedures   Basic metabolic panel   CBC with Differential/Platelet   Hypokalemia Mild. For now recommend potassium rich diet. We can recheck BMP next visit.  Leukocytosis, unspecified type Problem has been otherwise stable. Tobacco and chronic inflammation could be some of etiologic factors. 03/11/21 he had abs immature granulocytes 0.34 and on 04/01/21 0.08. CBC was just checked yesterday, will arrange labs in 4 weeks. We can consider hematologist evaluation.  Nausea and vomiting in adult Continue Zofran and Phenergan. We discussed some side effects of medications, including that risk of aggravating QT prolongation. Keep appt with GI.  Hypertension, essential, benign BP is better controlled. Continue Amlodipine 5 mg daily and Benazepril 40 mg daily. Continue monitoring BP regularly and low salt diet.  Chronic pain disorder We discussed current guideline in regard to chronic opioid use for pain management. He states that he has no Oxycodone left, took left prescription to Dr Ranell Patrick according to pt, as he was requested to do so. He is not having withdrawal symptoms at this time despite the fact he has not taken Oxycodone for  about 5 days. He understands I cannot continue managing this problem.  Will continue weaning off medication,plan written on AVS. A list of chronic pain providers in the are given.  He voices understanding and agrees with plan.  I spent a total of 45 minutes in both face to face and non face to face activities for this visit on the date of this encounter. During this time history was obtained and documented, examination was performed, prior labs reviewed, and assessment/plan discussed.   Return in about 3 months (around 07/04/2021).  Annika Selke G. Martinique, MD  [facility]. [place] office.

## 2021-04-03 NOTE — Assessment & Plan Note (Addendum)
Continue Zofran and Phenergan. We discussed some side effects of medications, including that risk of aggravating QT prolongation. Keep appt with GI.

## 2021-04-03 NOTE — Telephone Encounter (Signed)
Devina from [facility] called and wanted to know if patient is getting Oxycodone from [place] and if we disposed of them today. Informed her that patient is no longer getting Oxycodone from [place] and he received a letter stating we would no longer prescribe it. She stated the patient told her that the office informed him that he should dispose of his Oxycodone 10 mg since his PCP prescribed 5 mg. I informed Devina that no one in the clinical staff informed the patient to dispose of his medication. She stated she would not fill the 5 mg until it was time for it.

## 2021-04-05 ENCOUNTER — Encounter: Payer: Self-pay | Admitting: Internal Medicine

## 2021-04-05 ENCOUNTER — Ambulatory Visit (INDEPENDENT_AMBULATORY_CARE_PROVIDER_SITE_OTHER): Payer: BLUE CROSS/BLUE SHIELD | Admitting: Internal Medicine

## 2021-04-05 VITALS — BP 130/78 | HR 80 | Ht 73.0 in | Wt 206.0 lb

## 2021-04-05 DIAGNOSIS — R1011 Right upper quadrant pain: Secondary | ICD-10-CM | POA: Diagnosis not present

## 2021-04-05 DIAGNOSIS — K8050 Calculus of bile duct without cholangitis or cholecystitis without obstruction: Secondary | ICD-10-CM

## 2021-04-05 NOTE — Progress Notes (Signed)
HISTORY OF PRESENT ILLNESS:  [name] is a 45 y.o. 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  The patient has a very extensive and complex past medical history.  Please see his previous assessment.  He is maintained on chronic narcotics for chronic pain syndrome.  He has been seen by multiple GI providers here in [place], tertiary care centers, elsewhere.  He does have a history of remote cholecystectomy.  He carries the diagnosis of "chronic pancreatitis".  He also has a history of kidney stones.  His most recent history is that of presenting to the hospital in early October with abdominal pain, fever, and elevated liver test.  He was found to have choledocholithiasis and underwent ERCP with Dr. Benson Norway March 04, 2021.  His bile duct was cleared.  He was treated with antibiotics.  He was told to follow-up in this clinic.  Patient states that he has had problems with right upper quadrant pain since his procedure.  He has intermittent issues with nausea and vomiting.  He continues on narcotics.  He did undergo colonoscopy and upper endoscopy January 2020 to evaluate chronic abdominal pain.  Upper endoscopy was essentially normal.  Biopsies for H. pylori were negative.  Colonoscopy was normal except for diminutive polyps.  Patient was in the emergency room twice within the past week with complaints of abdominal pain.  I have reviewed laboratories and x-rays.  Laboratories were unremarkable.  In particular, liver tests were normal.  He also underwent CT scan of the abdomen and pelvis 4 days ago.  He was noted to have pneumobilia consistent with prior sphincterotomy.  No choledocholithiasis.  Evidence of prior cholecystectomy.  No evidence for pancreatitis.  In addition to normal liver tests, his lipase level was normal.  He is maintained on chronic PPI  REVIEW OF SYSTEMS:  All non-GI ROS negative unless otherwise  stated in the HPI.  Past Medical History:  Diagnosis Date   Chronic abdominal pain    Diverticulitis    Drug-seeking behavior    Hypertension    Kidney stones    Pancreatitis, chronic ([place]) 05/28/2005    Past Surgical History:  Procedure Laterality Date   APPENDECTOMY     CHOLECYSTECTOMY     ERCP N/A 12/30/2019   Procedure: ENDOSCOPIC RETROGRADE CHOLANGIOPANCREATOGRAPHY (ERCP);  Surgeon: Milus Banister, MD;  Location: [facility];  Service: Endoscopy;  Laterality: N/A;   ERCP N/A 03/04/2021   Procedure: ENDOSCOPIC RETROGRADE CHOLANGIOPANCREATOGRAPHY (ERCP);  Surgeon: Carol Ada, MD;  Location: [facility];  Service: Endoscopy;  Laterality: N/A;   ERCP W/ METAL STENT PLACEMENT     KIDNEY STONE SURGERY     REMOVAL OF STONES  12/30/2019   Procedure: REMOVAL OF STONES;  Surgeon: Milus Banister, MD;  Location: [facility];  Service: Endoscopy;;   REMOVAL OF STONES  03/04/2021   Procedure: REMOVAL OF STONES;  Surgeon: Carol Ada, MD;  Location: [facility];  Service: Endoscopy;;   SPHINCTEROTOMY  03/04/2021   Procedure: Joan Mayans;  Surgeon: Carol Ada, MD;  Location: [facility];  Service: Endoscopy;;    Social History Leda Gauze  reports that he has been smoking cigarettes. He has been smoking an average of .5 packs per day. He has never used smokeless tobacco. He reports that he does not currently use alcohol. He reports that he does not use drugs.  family history includes Breast cancer in his mother; Early death in his mother;  Heart disease in his father; Heart failure in his father; Hyperlipidemia in his father; Hypertension in his father and mother; Pancreatic cancer in his paternal aunt and paternal grandmother.  Allergies  Allergen Reactions   Cortisone Other (See Comments)    drops potassium level "bottoms out" potassium level drops potassium level Bottoms out potassium  "bottoms out" potassium level Other reaction(s): Other (See Comments) Bottoms  out potassium   Eggs Or Egg-Derived Products Hives and Other (See Comments)    Rash  Rash   Ketorolac Hives and Other (See Comments)    Hives, slightly labored breathing  Hives, slightly labored breathing   Ketorolac Tromethamine Hives and Shortness Of Breath   Haldol [Haloperidol Lactate] Other (See Comments)    "jittery"    Haloperidol Other (See Comments)    "jittery" "jittery"    Hydrocortisone Hives    Also drops potassium level   Iodinated Diagnostic Agents Nausea And Vomiting and Other (See Comments)    Hives Hives Hives Hives   Ketorolac Tromethamine Hives   Reglan [Metoclopramide]     Face "draws" and gets figgety   Compazine [Prochlorperazine] Other (See Comments)    Dystonic reaction       PHYSICAL EXAMINATION: Vital signs: BP 130/78   Pulse 80   Ht 6\' 1"  (1.854 m)   Wt 206 lb (93.4 kg)   BMI 27.18 kg/m   Constitutional: generally well-appearing, no acute distress Psychiatric: alert and oriented x3, cooperative Eyes: extraocular movements intact, anicteric, conjunctiva pink Mouth: oral pharynx moist, no lesions Neck: supple no lymphadenopathy Cardiovascular: heart regular rate and rhythm, no murmur Lungs: clear to auscultation bilaterally Abdomen: soft, some muscle wall tenderness in the upper abdomen bilaterally, nondistended, no obvious ascites, no peritoneal signs, normal bowel sounds, no organomegaly Rectal: Omitted Extremities: no clubbing, cyanosis, or lower extremity edema bilaterally Skin: no lesions on visible extremities Neuro: No focal deficits.  Cranial nerves intact  ASSESSMENT:  1.  Recent issues with symptomatic choledocholithiasis status post ERCP with stone extraction.  Currently with normal liver tests and CT that does not show evidence of choledocholithiasis. 2.  Right upper quadrant pain.  Negative recent work-up.  Some abdominal wall tenderness.  Possibly from vomiting 3.  Chronic pain syndrome 4.  Unremarkable upper endoscopy  and colonoscopy as described   PLAN:  1.  Reassurance regarding recent work-up 2.  Continue PPI 3.  Recheck liver tests in 3 months to assure normalization 4.  If the patient develops recurrent choledocholithiasis, consider Actigall therapy post ERCP  5.  Resume general medical care with PCP and other specialists A total time of 30 minutes was spent preparing to see the patient, reviewing multiple tests, x-rays, and endoscopic reports.  Obtaining comprehensive history, performing medically appropriate physical examination, counseling the patient regarding the above listed issues, ordering laboratories, and documenting clinical information in the health record.

## 2021-04-05 NOTE — Telephone Encounter (Signed)
Can you please ask him to come to the office tomorrow for labs. [I need a urine tox before I can continue weaning off Oxycodone.] Thanks, BJ

## 2021-04-05 NOTE — Addendum Note (Signed)
Addended by: Rodrigo Ran on: 04/05/2021 04:42 PM   Modules accepted: Orders

## 2021-04-05 NOTE — Patient Instructions (Signed)
If you are age 43 or older, your body mass index should be between 23-30. Your Body mass index is 27.18 kg/m. If this is out of the aforementioned range listed, please consider follow up with your Primary Care Provider.  If you are age 70 or younger, your body mass index should be between 19-25. Your Body mass index is 27.18 kg/m. If this is out of the aformentioned range listed, please consider follow up with your Primary Care Provider.   ________________________________________________________  The [place] [facility] providers would like to encourage you to use [facility] to communicate with providers for non-urgent requests or questions.  Due to long hold times on the telephone, sending your provider a message by [facility] may be a faster and more efficient way to get a response.  Please allow 48 business hours for a response.  Please remember that this is for non-urgent requests.  _______________________________________________________  Please come to the lab any time between 7:30am and 5:00pm in about three months to have some labs drawn.

## 2021-04-05 NOTE — Addendum Note (Signed)
Addended by: Rodrigo Ran on: 04/05/2021 04:45 PM   Modules accepted: Orders

## 2021-04-07 ENCOUNTER — Other Ambulatory Visit: Payer: Self-pay | Admitting: Family Medicine

## 2021-04-10 ENCOUNTER — Encounter: Payer: Self-pay | Admitting: Family Medicine

## 2021-04-13 ENCOUNTER — Ambulatory Visit: Payer: BLUE CROSS/BLUE SHIELD | Admitting: Registered Nurse

## 2021-04-23 ENCOUNTER — Emergency Department (HOSPITAL_BASED_OUTPATIENT_CLINIC_OR_DEPARTMENT_OTHER)
Admission: EM | Admit: 2021-04-23 | Discharge: 2021-04-23 | Disposition: A | Discharge status: Home / Self Care | Payer: BLUE CROSS/BLUE SHIELD | Attending: Emergency Medicine | Admitting: Emergency Medicine

## 2021-04-23 ENCOUNTER — Other Ambulatory Visit: Payer: Self-pay

## 2021-04-23 ENCOUNTER — Encounter (HOSPITAL_BASED_OUTPATIENT_CLINIC_OR_DEPARTMENT_OTHER): Payer: Self-pay | Admitting: *Deleted

## 2021-04-23 DIAGNOSIS — Z9049 Acquired absence of other specified parts of digestive tract: Secondary | ICD-10-CM | POA: Insufficient documentation

## 2021-04-23 DIAGNOSIS — K861 Other chronic pancreatitis: Secondary | ICD-10-CM

## 2021-04-23 DIAGNOSIS — Z8719 Personal history of other diseases of the digestive system: Secondary | ICD-10-CM | POA: Insufficient documentation

## 2021-04-23 DIAGNOSIS — E876 Hypokalemia: Secondary | ICD-10-CM | POA: Diagnosis not present

## 2021-04-23 DIAGNOSIS — I10 Essential (primary) hypertension: Secondary | ICD-10-CM | POA: Diagnosis not present

## 2021-04-23 DIAGNOSIS — F17210 Nicotine dependence, cigarettes, uncomplicated: Secondary | ICD-10-CM | POA: Diagnosis not present

## 2021-04-23 DIAGNOSIS — R1012 Left upper quadrant pain: Secondary | ICD-10-CM | POA: Diagnosis not present

## 2021-04-23 DIAGNOSIS — R112 Nausea with vomiting, unspecified: Secondary | ICD-10-CM | POA: Insufficient documentation

## 2021-04-23 DIAGNOSIS — Z79899 Other long term (current) drug therapy: Secondary | ICD-10-CM | POA: Diagnosis not present

## 2021-04-23 LAB — COMPREHENSIVE METABOLIC PANEL
ALT: 20 U/L (ref 0–44)
AST: 16 U/L (ref 15–41)
Albumin: 4.4 g/dL (ref 3.5–5.0)
Alkaline Phosphatase: 75 U/L (ref 38–126)
Anion gap: 10 (ref 5–15)
BUN: 12 mg/dL (ref 6–20)
CO2: 24 mmol/L (ref 22–32)
Calcium: 9.8 mg/dL (ref 8.9–10.3)
Chloride: 106 mmol/L (ref 98–111)
Creatinine, Ser: 0.74 mg/dL (ref 0.61–1.24)
GFR, Estimated: >60 mL/min (ref >60)
Glucose, Bld: 100 mg/dL — ABNORMAL HIGH (ref 70–99)
Potassium: 2.9 mmol/L — ABNORMAL LOW (ref 3.5–5.1)
Sodium: 140 mmol/L (ref 135–145)
Total Bilirubin: 0.6 mg/dL (ref 0.3–1.2)
Total Protein: 6.8 g/dL (ref 6.5–8.1)

## 2021-04-23 LAB — CBC WITH DIFFERENTIAL/PLATELET
Abs Immature Granulocytes: 0.07 10*3/uL (ref 0.00–0.07)
Basophils Absolute: 0.1 10*3/uL (ref 0.0–0.1)
Basophils Relative: 1 %
Eosinophils Absolute: 0.4 10*3/uL (ref 0.0–0.5)
Eosinophils Relative: 3 %
HCT: 41.8 % (ref 39.0–52.0)
Hemoglobin: 15 g/dL (ref 13.0–17.0)
Immature Granulocytes: 1 %
Lymphocytes Relative: 25 %
Lymphs Abs: 3.4 10*3/uL (ref 0.7–4.0)
MCH: 29.6 pg (ref 26.0–34.0)
MCHC: 35.9 g/dL (ref 30.0–36.0)
MCV: 82.4 fL (ref 80.0–100.0)
Monocytes Absolute: 1.4 10*3/uL — ABNORMAL HIGH (ref 0.1–1.0)
Monocytes Relative: 10 %
Neutro Abs: 8.2 10*3/uL — ABNORMAL HIGH (ref 1.7–7.7)
Neutrophils Relative %: 60 %
Platelets: 230 10*3/uL (ref 150–400)
RBC: 5.07 MIL/uL (ref 4.22–5.81)
RDW: 12.7 % (ref 11.5–15.5)
WBC: 13.5 10*3/uL — ABNORMAL HIGH (ref 4.0–10.5)
nRBC: 0 % (ref 0.0–0.2)

## 2021-04-23 LAB — LIPASE, BLOOD: Lipase: 18 U/L (ref 11–51)

## 2021-04-23 LAB — MAGNESIUM: Magnesium: 1.9 mg/dL (ref 1.7–2.4)

## 2021-04-23 MED ORDER — DIPHENHYDRAMINE HCL 50 MG/ML IJ SOLN
25.0000 mg | Freq: Once | INTRAMUSCULAR | Status: AC
  Administered 2021-04-23: 04:00:00 25 mg via INTRAVENOUS
  Filled 2021-04-23: qty 1

## 2021-04-23 MED ORDER — SODIUM CHLORIDE 0.9 % IV SOLN
25.0000 mg | Freq: Four times a day (QID) | INTRAVENOUS | Status: DC | PRN
  Administered 2021-04-23: 04:00:00 25 mg via INTRAVENOUS
  Filled 2021-04-23: qty 1

## 2021-04-23 MED ORDER — LACTATED RINGERS IV BOLUS
1000.0000 mL | Freq: Once | INTRAVENOUS | Status: AC
  Administered 2021-04-23: 03:00:00 1000 mL via INTRAVENOUS

## 2021-04-23 MED ORDER — POTASSIUM CHLORIDE CRYS ER 20 MEQ PO TBCR: 20.0000 meq | EXTENDED_RELEASE_TABLET | Freq: Three times a day (TID) | ORAL | 0 refills | Status: DC

## 2021-04-23 MED ORDER — POTASSIUM CHLORIDE CRYS ER 20 MEQ PO TBCR
40.0000 meq | EXTENDED_RELEASE_TABLET | Freq: Once | ORAL | Status: AC
  Administered 2021-04-23: 05:00:00 40 meq via ORAL
  Filled 2021-04-23: qty 2

## 2021-04-23 MED ORDER — HYDROMORPHONE HCL 1 MG/ML IJ SOLN
1.0000 mg | Freq: Once | INTRAMUSCULAR | Status: AC
  Administered 2021-04-23: 05:00:00 1 mg via INTRAVENOUS
  Filled 2021-04-23: qty 1

## 2021-04-23 MED ORDER — PROMETHAZINE HCL 25 MG RE SUPP: 25.0000 mg | Freq: Four times a day (QID) | RECTAL | 0 refills | Status: DC | PRN

## 2021-04-23 MED ORDER — HYDROMORPHONE HCL 1 MG/ML IJ SOLN
1.0000 mg | Freq: Once | INTRAMUSCULAR | Status: AC
  Administered 2021-04-23: 06:00:00 1 mg via INTRAVENOUS
  Filled 2021-04-23: qty 1

## 2021-04-23 MED ORDER — POTASSIUM CHLORIDE 10 MEQ/100ML IV SOLN
10.0000 meq | Freq: Once | INTRAVENOUS | Status: AC
  Administered 2021-04-23: 05:00:00 10 meq via INTRAVENOUS
  Filled 2021-04-23: qty 100

## 2021-04-23 MED ORDER — HYDROMORPHONE HCL 1 MG/ML IJ SOLN
1.0000 mg | Freq: Once | INTRAMUSCULAR | Status: AC
  Administered 2021-04-23: 04:00:00 1 mg via INTRAVENOUS
  Filled 2021-04-23: qty 1

## 2021-04-23 MED ORDER — PROMETHAZINE HCL 25 MG/ML IJ SOLN
INTRAMUSCULAR | Status: AC
  Filled 2021-04-23: qty 1

## 2021-04-23 MED ORDER — PROMETHAZINE HCL 25 MG PO TABS: 25.0000 mg | ORAL_TABLET | Freq: Four times a day (QID) | ORAL | 1 refills | Status: DC | PRN

## 2021-04-23 NOTE — ED Triage Notes (Addendum)
C/o left upper abd pain that started last night. Describes as sharp and constant. Radiates into his back. Took oxycodone around 2130 with no relief and also one zofran with no relief.  C/o n/v times 6 since 2130. States she is concerned he has "pancreatitis" pt states he had a few sips of beer during a hockey game.

## 2021-04-23 NOTE — Discharge Instructions (Addendum)
Continue taking your home medications as prescribed.  Follow-up with your primary care provider.  Return if symptoms are getting worse.

## 2021-04-23 NOTE — ED Provider Notes (Signed)
[facility] EM[facility] DEPT Provider Note   CSN: [id number] Arrival date & time: 04/23/21  0737     History Chief Complaint  Patient presents with   Abdominal Pain    [name] is a 45 y.o. male.  The history is provided by the patient.  Abdominal Pain He has history of hypertension, hyperlipidemia, chronic pancreatitis with chronic pain and comes in with left upper quadrant pain which started about 9 PM.  Pain does radiate to the back.  There is associated nausea and vomiting.  Pain is typical of his pancreatitis flares and is rated at 7/10.  He has had some sweats but no fever or chills.  He took a dose of oxycodone and ondansetron, but vomited after taking them.  They did not help the pain.   Past Medical History:  Diagnosis Date   Chronic abdominal pain    Diverticulitis    Drug-seeking behavior    Hypertension    Kidney stones    Pancreatitis, chronic ([place]) 05/28/2005    Patient Active Problem List   Diagnosis Date Noted   Acute cholangitis 03/03/2021   Diverticulitis 11/25/2020   Kidney stones 11/25/2020   Hyperlipidemia, mixed 03/01/2020   Abnormal magnetic resonance cholangiopancreatography (MRCP)    Choledocholithiasis    E coli bacteremia    Bacteremia due to Enterobacter species    Bacteremia 12/27/2019   QT prolongation 12/27/2019   Nausea without vomiting    Chronic pain disorder 08/29/2018   Back pain, chronic 08/29/2018   Nausea and vomiting in adult 02/25/2018   Hypertension, essential, benign 01/18/2018   Chronic biliary pancreatitis ([place]) 01/18/2018   Chronic abdominal pain 01/14/2018    Past Surgical History:  Procedure Laterality Date   APPENDECTOMY     CHOLECYSTECTOMY     ERCP N/A 12/30/2019   Procedure: ENDOSCOPIC RETROGRADE CHOLANGIOPANCREATOGRAPHY (ERCP);  Surgeon: Milus Banister, MD;  Location: [facility];  Service: Endoscopy;  Laterality: N/A;   ERCP N/A 03/04/2021   Procedure: ENDOSCOPIC RETROGRADE  CHOLANGIOPANCREATOGRAPHY (ERCP);  Surgeon: Carol Ada, MD;  Location: [facility];  Service: Endoscopy;  Laterality: N/A;   ERCP W/ METAL STENT PLACEMENT     KIDNEY STONE SURGERY     REMOVAL OF STONES  12/30/2019   Procedure: REMOVAL OF STONES;  Surgeon: Milus Banister, MD;  Location: [facility];  Service: Endoscopy;;   REMOVAL OF STONES  03/04/2021   Procedure: REMOVAL OF STONES;  Surgeon: Carol Ada, MD;  Location: [facility];  Service: Endoscopy;;   SPHINCTEROTOMY  03/04/2021   Procedure: Joan Mayans;  Surgeon: Carol Ada, MD;  Location: [facility];  Service: Endoscopy;;       Family History  Problem Relation Age of Onset   Breast cancer Mother    Early death Mother    Hypertension Mother    Heart failure Father    Hypertension Father    Heart disease Father    Hyperlipidemia Father    Pancreatic cancer Paternal Grandmother        possibly   Pancreatic cancer Paternal Aunt    Colon cancer Neg Hx     Social History   Tobacco Use   Smoking status: Every Day    Packs/day: 0.50    Types: Cigarettes   Smokeless tobacco: Never  Vaping Use   Vaping Use: Never used  Substance Use Topics   Alcohol use: Not Currently    Comment: one beer tonight   Drug use: No    Home Medications Prior to Admission  medications   Medication Sig Start Date End Date Taking? Authorizing Provider  amLODipine-benazepril (LOTREL) 5-20 MG capsule TAKE 1 CAPSULE BY MOUTH EVERY DAY Patient taking differently: Take 1 capsule by mouth daily. 11/12/20   [place]ique, Betty G, MD  benazepril (LOTENSIN) 20 MG tablet TAKE 1 TABLET BY MOUTH EVERY DAY 04/07/21   [place], Betty G, MD  fluticasone [facility]) 50 MCG/ACT nasal spray Place 1 spray into both nostrils 2 (two) times daily. Patient taking differently: Place 1 spray into both nostrils daily as needed for allergies. 04/03/19   [place], Betty G, MD  metoprolol succinate (TOPROL-XL) 100 MG 24 hr tablet TAKE 1 TABLET (100 MG TOTAL) BY MOUTH DAILY.  TAKE WITH OR IMMEDIATELY FOLLOWING A MEAL. 10/25/20   [place], Betty G, MD  naloxone [facility]) nasal spray 4 mg/0.1 mL 1 spray in each nostril x 1 if opioid overdose. Patient not taking: Reported on 04/05/2021 06/08/20   [place], Betty G, MD  ondansetron [facility] ODT) 4 MG disintegrating tablet Place 1 tablet under the tongue every 12 hours PRN nausea 04/03/21   [place], Betty G, MD  oxyCODONE (OXY IR/ROXICODONE) 5 MG immediate release tablet Take 1 tablet (5 mg total) by mouth every 8 (eight) hours as needed for up to 10 days for moderate pain or severe pain. 04/03/21 04/13/21  [place], Betty G, MD  pantoprazole (PROTONIX) 40 MG tablet Take 1 tablet (40 mg total) by mouth daily. Patient taking differently: Take 40 mg by mouth daily as needed (heart burn). 12/25/20   Lajean Saver, MD  promethazine (PHENERGAN) 25 MG tablet TAKE 1 TABLET BY MOUTH EVERY 12 HOURS AS NEEDED 03/17/21   [place], Betty G, MD    Allergies    Cortisone, Eggs or egg-derived products, Ketorolac, Ketorolac tromethamine, Haldol [haloperidol lactate], Haloperidol, Hydrocortisone, Iodinated diagnostic agents, Ketorolac tromethamine, Reglan [metoclopramide], and Compazine [prochlorperazine]  Review of Systems   Review of Systems  Gastrointestinal:  Positive for abdominal pain.  All other systems reviewed and are negative.  Physical Exam Updated Vital Signs BP (!) 164/112   Pulse 76   Temp 98.3 F (36.8 C)   Resp 18   Ht 6\' 1"  (1.854 m)   Wt 93 kg   SpO2 98%   BMI 27.05 kg/m   Physical Exam Vitals and nursing note reviewed.  45 year old male, appears uncomfortable, but is in no acute distress. Vital signs are significant for elevated blood pressure. Oxygen saturation is 98%, which is normal. Head is normocephalic and atraumatic. PERRLA, EOMI. Oropharynx is clear. Neck is nontender and supple without adenopathy or JVD. Back is nontender and there is no CVA tenderness. Lungs are clear without rales, wheezes, or rhonchi. Chest  is nontender. Heart has regular rate and rhythm without murmur. Abdomen is soft, flat, with moderate left upper quadrant tenderness.  There is no rebound or guarding.  There are no masses or hepatosplenomegaly and peristalsis is normoactive. Extremities have no cyanosis or edema, full range of motion is present. Skin is warm and dry without rash. Neurologic: Mental status is normal, cranial nerves are intact, moves all extremities equally.  ED Results / Procedures / Treatments   Labs (all labs ordered are listed, but only abnormal results are displayed) Labs Reviewed  COMPREHENSIVE METABOLIC PANEL - Abnormal; Notable for the following components:      Result Value   Potassium 2.9 (*)    Glucose, Bld 100 (*)    All other components within normal limits  CBC WITH DIFFERENTIAL/PLATELET - Abnormal; Notable  for the following components:   WBC 13.5 (*)    Neutro Abs 8.2 (*)    Monocytes Absolute 1.4 (*)    All other components within normal limits  LIPASE, BLOOD  MAGNESIUM    EKG EKG Interpretation  Date/Time:  Sunday April 23 2021 03:36:07 EST Ventricular Rate:  72 PR Interval:  156 QRS Duration: 101 QT Interval:  428 QTC Calculation: 469 R Axis:   47 Text Interpretation: Sinus rhythm Normal ECG When compared with ECG of 03/30/2021, No significant change was found Confirmed by ,  ([id number]) on 04/23/2021 4:32:18 AM  Procedures Procedures   Medications Ordered in ED Medications  promethazine (PHENERGAN) 25 mg in sodium chloride 0.9 % 50 mL IVPB (0 mg Intravenous Stopped 04/23/21 0356)  promethazine (PHENERGAN) 25 MG/ML injection (  Not Given 04/23/21 0343)  HYDROmorphone (DILAUDID) injection 1 mg (1 mg Intravenous Given 04/23/21 0331)  lactated ringers bolus 1,000 mL (0 mLs Intravenous Stopped 04/23/21 0430)  diphenhydrAMINE (BENADRYL) injection 25 mg (25 mg Intravenous Given 04/23/21 0330)  HYDROmorphone (DILAUDID) injection 1 mg (1 mg Intravenous Given 04/23/21  0458)  potassium chloride SA (KLOR-CON) CR tablet 40 mEq (40 mEq Oral Given 04/23/21 0458)  potassium chloride 10 mEq in 100 mL IVPB (0 mEq Intravenous Stopped 04/23/21 0556)  HYDROmorphone (DILAUDID) injection 1 mg (1 mg Intravenous Given 04/23/21 0555)    ED Course  I have reviewed the triage vital signs and the nursing notes.  Pertinent labs & imaging results that were available during my care of the patient were reviewed by me and considered in my medical decision making (see chart for details).   MDM Rules/Calculators/A&P                         Left upper quadrant abdominal pain in patient with history of chronic pancreatitis.  Old records are reviewed, and he has numerous ED visits with similar complaints.  I have counted 39 CT scans of his abdomen and pelvis between our system and care everywhere.  At this point, abdominal exam is reasonably benign, no indication for advanced imaging.  We will check screening labs and give IV fluids, hydromorphone, promethazine.  He is reported to have a history of prolonged QT interval, so ECG is checked showing normal QT interval.  Following above-noted treatment, he is feeling somewhat better but is still in significant amount of pain.  Labs are reassuring with exception of hypokalemia with potassium 2.9.  He will be given additional hydromorphone as well as oral and intravenous potassium.  Following second dose of hydromorphone, he felt like he was almost well enough to go home.  Is given a third dose of hydromorphone and discharged with prescriptions for K-Dur as well as promethazine tablets and suppositories.  Follow-up with PCP.  Final Clinical Impression(s) / ED Diagnoses Final diagnoses:  LUQ abdominal pain  Hypokalemia  Nausea and vomiting, unspecified vomiting type    Rx / DC Orders ED Discharge Orders          Ordered    potassium chloride SA (KLOR-CON) 20 MEQ tablet  3 times daily        04/23/21 0605    promethazine (PHENERGAN)  25 MG tablet  Every 6 hours PRN        04/23/21 0606    promethazine (PHENERGAN) 25 MG suppository  Every 6 hours PRN        11 /27/22 0606  Delora Fuel, MD 85/46/27 [phone]

## 2021-04-28 ENCOUNTER — Encounter: Payer: Self-pay | Admitting: Family Medicine

## 2021-04-28 ENCOUNTER — Telehealth (INDEPENDENT_AMBULATORY_CARE_PROVIDER_SITE_OTHER): Payer: BLUE CROSS/BLUE SHIELD | Admitting: Family Medicine

## 2021-04-28 VITALS — BP 129/84 | Ht 73.0 in

## 2021-04-28 DIAGNOSIS — E876 Hypokalemia: Secondary | ICD-10-CM | POA: Diagnosis not present

## 2021-04-28 DIAGNOSIS — D72829 Elevated white blood cell count, unspecified: Secondary | ICD-10-CM | POA: Diagnosis not present

## 2021-04-28 DIAGNOSIS — G894 Chronic pain syndrome: Secondary | ICD-10-CM | POA: Diagnosis not present

## 2021-04-28 DIAGNOSIS — I10 Essential (primary) hypertension: Secondary | ICD-10-CM

## 2021-04-28 MED ORDER — OXYCODONE HCL 5 MG PO TABS: 5.0000 mg | ORAL_TABLET | Freq: Two times a day (BID) | ORAL | 0 refills | Status: DC | PRN

## 2021-04-28 NOTE — Progress Notes (Signed)
Virtual Visit via Video Note I connected with [name] on 04/28/21 by a video enabled telemedicine application and verified that I am speaking with the correct person using two identifiers.  Location patient: home Location provider:work office Persons participating in the virtual visit: patient, provider  I discussed the limitations of evaluation and management by telemedicine and the availability of in person appointments. The patient expressed understanding and agreed to proceed.  Chief Complaint  Patient presents with   Follow-up   Pain Management   HPI: [name] is a 45 year old male with history of hypertension and chronic pain following today on recent ER visit. He presented to the ER on 04/23/21 because LLQ pain radiated to the back. Associated nausea and vomiting.  Treated with IV Phenergan, Benadryl,and Hydromorphone 1 mg x 2.  HypoK+, she was placed on KCL 20 meq daily. Received Rx for phenergan tabs and suppositories. BP was elevated at 164/112. He is on Amlodipine -benazepril 5-20 mg daily and Benazepril 20 mg daily.  Lab Results  Component Value Date   CREATININE 0.74 04/23/2021   BUN 12 04/23/2021   NA 140 04/23/2021   K 2.9 (L) 04/23/2021   CL 106 04/23/2021   CO2 24 04/23/2021   Lab Results  Component Value Date   ALT 20 04/23/2021   AST 16 04/23/2021   ALKPHOS 75 04/23/2021   BILITOT 0.6 04/23/2021   WBC's have been mildly elevated. Negative for fever,night sweats,or abnormal wt loss.  Lab Results  Component Value Date   WBC 13.5 (H) 04/23/2021   HGB 15.0 04/23/2021   HCT 41.8 04/23/2021   MCV 82.4 04/23/2021   PLT 230 04/23/2021   Chronic pain: Last follow up 04/03/21. He does not have his oxycodone bottle with him, he is staying with his brother-in-law because his kitchen is being remodeled. He thinks he may have 2 to 3 tablets left. He filled his last Rx for Oxycodone 5 mg on 04/15/21, received 30 tabs. States that last time he took  medication was today morning.  Denies illicit drug use or taking other controlled medication.  He already has an appointment with pain management at [facility], 06/05/2021.  Since his last visit he has followed with GI, Dr. Henrene Pastor.  Planning on having blood work in 05/2021.  ROS: See pertinent positives and negatives per HPI.  Past Medical History:  Diagnosis Date   Chronic abdominal pain    Diverticulitis    Drug-seeking behavior    Hypertension    Kidney stones    Pancreatitis, chronic ([place]) 05/28/2005    Past Surgical History:  Procedure Laterality Date   APPENDECTOMY     CHOLECYSTECTOMY     ERCP N/A 12/30/2019   Procedure: ENDOSCOPIC RETROGRADE CHOLANGIOPANCREATOGRAPHY (ERCP);  Surgeon: Milus Banister, MD;  Location: [facility];  Service: Endoscopy;  Laterality: N/A;   ERCP N/A 03/04/2021   Procedure: ENDOSCOPIC RETROGRADE CHOLANGIOPANCREATOGRAPHY (ERCP);  Surgeon: Carol Ada, MD;  Location: [facility];  Service: Endoscopy;  Laterality: N/A;   ERCP W/ METAL STENT PLACEMENT     KIDNEY STONE SURGERY     REMOVAL OF STONES  12/30/2019   Procedure: REMOVAL OF STONES;  Surgeon: Milus Banister, MD;  Location: [facility];  Service: Endoscopy;;   REMOVAL OF STONES  03/04/2021   Procedure: REMOVAL OF STONES;  Surgeon: Carol Ada, MD;  Location: [facility];  Service: Endoscopy;;   SPHINCTEROTOMY  03/04/2021   Procedure: Joan Mayans;  Surgeon: Carol Ada, MD;  Location: [facility];  Service: Endoscopy;;    Family History  Problem Relation Age of Onset   Breast cancer Mother    Early death Mother    Hypertension Mother    Heart failure Father    Hypertension Father    Heart disease Father    Hyperlipidemia Father    Pancreatic cancer Paternal Grandmother        possibly   Pancreatic cancer Paternal Aunt    Colon cancer Neg Hx     Social History   Socioeconomic History   Marital status: Married    Spouse name: Not on file   Number of children: 1    Years of education: Not on file   Highest education level: Some college, no degree  Occupational History   Not on file  Tobacco Use   Smoking status: Every Day    Packs/day: 0.50    Types: Cigarettes   Smokeless tobacco: Never  Vaping Use   Vaping Use: Never used  Substance and Sexual Activity   Alcohol use: Not Currently    Comment: one beer tonight   Drug use: No   Sexual activity: Not on file  Other Topics Concern   Not on file  Social History Narrative   Not on file   Social Determinants of Health   Financial Resource Strain: Low Risk    Difficulty of Paying Living Expenses: Not hard at all  Food Insecurity: No Food Insecurity   Worried About Charity fundraiser in the Last Year: Never true   Ran Out of Food in the Last Year: Never true  Transportation Needs: No Transportation Needs   Lack of Transportation (Medical): No   Lack of Transportation (Non-Medical): No  Physical Activity: Insufficiently Active   Days of Exercise per Week: 1 day   Minutes of Exercise per Session: 20 min  Stress: No Stress Concern Present   Feeling of Stress : Only a little  Social Connections: Engineer, building services of Communication with Friends and Family: Three times a week   Frequency of Social Gatherings with Friends and Family: Once a week   Attends Religious Services: More than 4 times per year   Active Member of [employer] or Organizations: Yes   Attends Music therapist: More than 4 times per year   Marital Status: Married  Human resources officer Violence: Not on file    Current Outpatient Medications:    amLODipine-benazepril (LOTREL) 5-20 MG capsule, TAKE 1 CAPSULE BY MOUTH EVERY DAY (Patient taking differently: Take 1 capsule by mouth daily.), Disp: 90 capsule, Rfl: 2   benazepril (LOTENSIN) 20 MG tablet, TAKE 1 TABLET BY MOUTH EVERY DAY, Disp: 90 tablet, Rfl: 1   fluticasone (FLONASE) 50 MCG/ACT nasal spray, Place 1 spray into both nostrils 2 (two) times daily.  (Patient taking differently: Place 1 spray into both nostrils daily as needed for allergies.), Disp: 16 g, Rfl: 2   metoprolol succinate (TOPROL-XL) 100 MG 24 hr tablet, TAKE 1 TABLET (100 MG TOTAL) BY MOUTH DAILY. TAKE WITH OR IMMEDIATELY FOLLOWING A MEAL., Disp: 90 tablet, Rfl: 1   naloxone (NARCAN) nasal spray 4 mg/0.1 mL, 1 spray in each nostril x 1 if opioid overdose., Disp: 1 each, Rfl: 1   ondansetron (ZOFRAN ODT) 4 MG disintegrating tablet, Place 1 tablet under the tongue every 12 hours PRN nausea, Disp: 20 tablet, Rfl: 0   pantoprazole (PROTONIX) 40 MG tablet, Take 1 tablet (40 mg total) by mouth daily. (Patient taking differently: Take 40 mg  by mouth daily as needed (heart burn).), Disp: 30 tablet, Rfl: 0   potassium chloride SA (KLOR-CON) 20 MEQ tablet, Take 1 tablet (20 mEq total) by mouth 3 (three) times daily., Disp: 21 tablet, Rfl: 0   promethazine (PHENERGAN) 25 MG suppository, Place 1 suppository (25 mg total) rectally every 6 (six) hours as needed for nausea or vomiting., Disp: 12 each, Rfl: 0   promethazine (PHENERGAN) 25 MG tablet, Take 1 tablet (25 mg total) by mouth every 6 (six) hours as needed., Disp: 45 tablet, Rfl: 1   oxyCODONE (OXY IR/ROXICODONE) 5 MG immediate release tablet, Take 1 tablet (5 mg total) by mouth every 12 (twelve) hours as needed for up to 10 days for moderate pain or severe pain., Disp: 20 tablet, Rfl: 0  EXAM:  VITALS per patient if applicable:BP 383/33   Ht 6\' 1"  (1.854 m)   BMI 27.05 kg/m   GENERAL: alert, oriented, appears well and in no acute distress  HEENT: atraumatic, conjunctiva clear, no obvious abnormalities on inspection.  NECK: normal movements of the head and neck  LUNGS: on inspection no signs of respiratory distress, breathing rate appears normal, no obvious gross SOB, gasping or wheezing  CV: no obvious cyanosis  MS: moves all visible extremities without noticeable abnormality  PSYCH/NEURO: pleasant and cooperative, no obvious  depression or anxiety, speech and thought processing grossly intact  ASSESSMENT AND PLAN:  Discussed the following assessment and plan: Orders Placed This Encounter  Procedures   Potassium   Chronic pain disorder - Plan: oxyCODONE (OXY IR/ROXICODONE) 5 MG immediate release tablet [place] controlled subs report reviewed. We do not have a medication contract any longer. Will continue weaning off Oxycodone, decreased Oxycodone from 5 mg tid to bid x 10 days. He will keep appt with pain management at [facility]. We discussed current guidelines in regard to opioid to treat chronic pain.  Hypertension, essential, benign Home BP's have been adequate. Continue Metoprolol succinate 100 mg daily and Benazepril 40 mg daily,and Amlodipine 5 mg daily. Low salt diet to continue.  Leukocytosis, unspecified type Mild and and stable since 2018. He refers to hold on hematologic referral.  Hypokalemia - Plan: Potassium Continue KCL 20 meq daily. K+ to be re-checked next week.  I discussed the assessment and treatment plan with the patient. The patient was provided an opportunity to ask questions and all were answered. The patient agreed with the plan and demonstrated an understanding of the instructions.  Return in about 6 months (around 10/27/2021) for HTN. Marland Kitchen  Miyako Oelke G. Martin[place], MD  [facility]. [place] office.

## 2021-04-29 ENCOUNTER — Other Ambulatory Visit: Payer: Self-pay | Admitting: Family Medicine

## 2021-04-29 DIAGNOSIS — I10 Essential (primary) hypertension: Secondary | ICD-10-CM

## 2021-04-29 MED ORDER — AMLODIPINE BESYLATE 5 MG PO TABS: 5.0000 mg | ORAL_TABLET | Freq: Every day | ORAL | 1 refills | Status: DC

## 2021-04-29 MED ORDER — BENAZEPRIL HCL 40 MG PO TABS
40.0000 mg | ORAL_TABLET | Freq: Every day | ORAL | 1 refills | Status: DC
Start: 2021-04-29 — End: 2021-10-30

## 2021-05-01 ENCOUNTER — Other Ambulatory Visit: Payer: BLUE CROSS/BLUE SHIELD

## 2021-05-02 ENCOUNTER — Encounter (HOSPITAL_BASED_OUTPATIENT_CLINIC_OR_DEPARTMENT_OTHER): Payer: Self-pay | Admitting: *Deleted

## 2021-05-02 ENCOUNTER — Emergency Department (HOSPITAL_BASED_OUTPATIENT_CLINIC_OR_DEPARTMENT_OTHER)
Admission: EM | Admit: 2021-05-02 | Discharge: 2021-05-03 | Disposition: A | Discharge status: Home / Self Care | Payer: BLUE CROSS/BLUE SHIELD | Attending: Emergency Medicine | Admitting: Emergency Medicine

## 2021-05-02 ENCOUNTER — Other Ambulatory Visit: Payer: Self-pay

## 2021-05-02 DIAGNOSIS — R112 Nausea with vomiting, unspecified: Secondary | ICD-10-CM

## 2021-05-02 DIAGNOSIS — G8929 Other chronic pain: Secondary | ICD-10-CM | POA: Diagnosis not present

## 2021-05-02 DIAGNOSIS — I10 Essential (primary) hypertension: Secondary | ICD-10-CM | POA: Diagnosis not present

## 2021-05-02 DIAGNOSIS — R1013 Epigastric pain: Secondary | ICD-10-CM | POA: Diagnosis not present

## 2021-05-02 DIAGNOSIS — F17210 Nicotine dependence, cigarettes, uncomplicated: Secondary | ICD-10-CM | POA: Diagnosis not present

## 2021-05-02 DIAGNOSIS — R1012 Left upper quadrant pain: Secondary | ICD-10-CM | POA: Insufficient documentation

## 2021-05-02 DIAGNOSIS — R109 Unspecified abdominal pain: Secondary | ICD-10-CM

## 2021-05-02 DIAGNOSIS — Z79899 Other long term (current) drug therapy: Secondary | ICD-10-CM | POA: Diagnosis not present

## 2021-05-02 LAB — CBC WITH DIFFERENTIAL/PLATELET
Abs Immature Granulocytes: 0.04 10*3/uL (ref 0.00–0.07)
Basophils Absolute: 0.1 10*3/uL (ref 0.0–0.1)
Basophils Relative: 1 %
Eosinophils Absolute: 0.3 10*3/uL (ref 0.0–0.5)
Eosinophils Relative: 3 %
HCT: 43.1 % (ref 39.0–52.0)
Hemoglobin: 15.6 g/dL (ref 13.0–17.0)
Immature Granulocytes: 0 %
Lymphocytes Relative: 22 %
Lymphs Abs: 2.5 10*3/uL (ref 0.7–4.0)
MCH: 30.1 pg (ref 26.0–34.0)
MCHC: 36.2 g/dL — ABNORMAL HIGH (ref 30.0–36.0)
MCV: 83.2 fL (ref 80.0–100.0)
Monocytes Absolute: 1.2 10*3/uL — ABNORMAL HIGH (ref 0.1–1.0)
Monocytes Relative: 10 %
Neutro Abs: 7.6 10*3/uL (ref 1.7–7.7)
Neutrophils Relative %: 64 %
Platelets: 235 10*3/uL (ref 150–400)
RBC: 5.18 MIL/uL (ref 4.22–5.81)
RDW: 12.7 % (ref 11.5–15.5)
WBC: 11.7 10*3/uL — ABNORMAL HIGH (ref 4.0–10.5)
nRBC: 0 % (ref 0.0–0.2)

## 2021-05-02 MED ORDER — SODIUM CHLORIDE 0.9 % IV SOLN
25.0000 mg | Freq: Four times a day (QID) | INTRAVENOUS | Status: DC | PRN
  Administered 2021-05-02: 25 mg via INTRAVENOUS
  Filled 2021-05-02: qty 1

## 2021-05-02 MED ORDER — SODIUM CHLORIDE 0.9 % IV BOLUS
1000.0000 mL | Freq: Once | INTRAVENOUS | Status: AC
  Administered 2021-05-02: 1000 mL via INTRAVENOUS

## 2021-05-02 MED ORDER — PROMETHAZINE HCL 25 MG/ML IJ SOLN
INTRAMUSCULAR | Status: AC
  Filled 2021-05-02: qty 1

## 2021-05-02 MED ORDER — HYDROMORPHONE HCL 1 MG/ML IJ SOLN
1.0000 mg | INTRAMUSCULAR | Status: AC | PRN
  Administered 2021-05-02 – 2021-05-03 (×2): 1 mg via INTRAVENOUS
  Filled 2021-05-02 (×2): qty 1

## 2021-05-02 NOTE — ED Triage Notes (Signed)
C/o abd pain HX of same , recent labs 11/27 and PMD visit 12/2 for same

## 2021-05-02 NOTE — ED Provider Notes (Signed)
[facility] Provider Note: Georgena Spurling, MD, FACEP  CSN: [id number] MRN: [id number] ARRIVAL: 05/02/21 at 2318 ROOM: [place]  Abdominal Pain (Chronic )   HISTORY OF PRESENT ILLNESS  05/02/21 11:27 PM [name] is a 45 y.o. male with chronic abdominal pain due to chronic pancreatitis.  He has had multiple visits to the ED for the same, usually episodes of nausea and vomiting that prevent him from taking his home pain medication.  He is here with 2 days of nausea and vomiting with epigastric and left upper quadrant pain consistent with previous episodes.  He rates the pain as a 10 out of 10, worse with movement or palpation.   Past Medical History:  Diagnosis Date   Chronic abdominal pain    Diverticulitis    Drug-seeking behavior    Hypertension    Kidney stones    Pancreatitis, chronic ([place]) 05/28/2005    Past Surgical History:  Procedure Laterality Date   APPENDECTOMY     CHOLECYSTECTOMY     ERCP N/A 12/30/2019   Procedure: ENDOSCOPIC RETROGRADE CHOLANGIOPANCREATOGRAPHY (ERCP);  Surgeon: Milus Banister, MD;  Location: [facility];  Service: Endoscopy;  Laterality: N/A;   ERCP N/A 03/04/2021   Procedure: ENDOSCOPIC RETROGRADE CHOLANGIOPANCREATOGRAPHY (ERCP);  Surgeon: Carol Ada, MD;  Location: [facility];  Service: Endoscopy;  Laterality: N/A;   ERCP W/ METAL STENT PLACEMENT     KIDNEY STONE SURGERY     REMOVAL OF STONES  12/30/2019   Procedure: REMOVAL OF STONES;  Surgeon: Milus Banister, MD;  Location: [facility];  Service: Endoscopy;;   REMOVAL OF STONES  03/04/2021   Procedure: REMOVAL OF STONES;  Surgeon: Carol Ada, MD;  Location: [facility];  Service: Endoscopy;;   SPHINCTEROTOMY  03/04/2021   Procedure: Joan Mayans;  Surgeon: Carol Ada, MD;  Location: [facility];  Service: Endoscopy;;    Family History  Problem Relation Age of Onset   Breast cancer Mother    Early death Mother    Hypertension Mother     Heart failure Father    Hypertension Father    Heart disease Father    Hyperlipidemia Father    Pancreatic cancer Paternal Grandmother        possibly   Pancreatic cancer Paternal Aunt    Colon cancer Neg Hx     Social History   Tobacco Use   Smoking status: Every Day    Packs/day: 0.50    Types: Cigarettes   Smokeless tobacco: Never  Vaping Use   Vaping Use: Never used  Substance Use Topics   Alcohol use: Not Currently    Comment: one beer tonight   Drug use: No    Prior to Admission medications   Medication Sig Start Date End Date Taking? Authorizing Provider  amLODipine (NORVASC) 5 MG tablet Take 1 tablet (5 mg total) by mouth daily. 04/29/21   Martinique, Betty G, MD  benazepril (LOTENSIN) 40 MG tablet Take 1 tablet (40 mg total) by mouth daily. 04/29/21   Martinique, Betty G, MD  fluticasone (FLONASE) 50 MCG/ACT nasal spray Place 1 spray into both nostrils 2 (two) times daily. Patient taking differently: Place 1 spray into both nostrils daily as needed for allergies. 04/03/19   Martinique, Betty G, MD  metoprolol succinate (TOPROL-XL) 100 MG 24 hr tablet TAKE 1 TABLET BY MOUTH DAILY. TAKE WITH OR IMMEDIATELY FOLLOWING A MEAL. 05/01/21   Martinique, Betty G, MD  naloxone [facility]) nasal spray 4 mg/0.1 mL  1 spray in each nostril x 1 if opioid overdose. 06/08/20   Martinique, Betty G, MD  ondansetron (ZOFRAN ODT) 4 MG disintegrating tablet Place 1 tablet under the tongue every 12 hours PRN nausea 04/03/21   Martinique, Betty G, MD  oxyCODONE (OXY IR/ROXICODONE) 5 MG immediate release tablet Take 1 tablet (5 mg total) by mouth every 12 (twelve) hours as needed for up to 10 days for moderate pain or severe pain. 04/28/21 05/08/21  Martinique, Betty G, MD  pantoprazole (PROTONIX) 40 MG tablet Take 1 tablet (40 mg total) by mouth daily. Patient taking differently: Take 40 mg by mouth daily as needed (heart burn). 12/25/20   Lajean Saver, MD  potassium chloride SA (KLOR-CON) 20 MEQ tablet Take 1 tablet (20 mEq  total) by mouth 3 (three) times daily. 81/19/14   Delora Fuel, MD  promethazine (PHENERGAN) 25 MG suppository Place 1 suppository (25 mg total) rectally every 6 (six) hours as needed for nausea or vomiting. 78/29/56   Delora Fuel, MD  promethazine (PHENERGAN) 25 MG tablet Take 1 tablet (25 mg total) by mouth every 6 (six) hours as needed. 21/30/86   Delora Fuel, MD    Allergies Cortisone, Eggs or egg-derived products, Ketorolac, Ketorolac tromethamine, Haldol [haloperidol lactate], Haloperidol, Hydrocortisone, Iodinated diagnostic agents, Ketorolac tromethamine, Reglan [metoclopramide], and Compazine [prochlorperazine]   REVIEW OF SYSTEMS  Negative except as noted here or in the History of Present Illness.   PHYSICAL EXAMINATION  Initial Vital Signs Blood pressure (!) 156/113, pulse (!) 117, temperature 98.2 F (36.8 C), temperature source Oral, resp. rate 18, height 6\' 1"  (1.854 m), weight 93 kg, SpO2 99 %.  Examination General: Well-developed, well-nourished male in no acute distress; appearance consistent with age of record HENT: normocephalic; atraumatic Eyes: Normal appearance Neck: supple Heart: regular rate and rhythm Lungs: clear to auscultation bilaterally Abdomen: soft; nondistended; epigastric and left upper quadrant tenderness; bowel sounds present Extremities: No deformity; full range of motion; pulses normal Neurologic: Awake, alert and oriented; motor function intact in all extremities and symmetric; no facial droop Skin: Warm and dry Psychiatric: Normal mood and affect   RESULTS  Summary of this visit's results, reviewed and interpreted by myself:   EKG Interpretation  Date/Time:    Ventricular Rate:    PR Interval:    QRS Duration:   QT Interval:    QTC Calculation:   R Axis:     Text Interpretation:         Laboratory Studies: Results for orders placed or performed during the hospital encounter of 05/02/21 (from the past 24 hour(s))  Lipase,  blood     Status: None   Collection Time: 05/02/21 11:43 PM  Result Value Ref Range   Lipase 31 11 - 51 U/L  Comprehensive metabolic panel     Status: Abnormal   Collection Time: 05/02/21 11:43 PM  Result Value Ref Range   Sodium 137 135 - 145 mmol/L   Potassium 3.3 (L) 3.5 - 5.1 mmol/L   Chloride 107 98 - 111 mmol/L   CO2 22 22 - 32 mmol/L   Glucose, Bld 114 (H) 70 - 99 mg/dL   BUN 13 6 - 20 mg/dL   Creatinine, Ser 0.83 0.61 - 1.24 mg/dL   Calcium 9.0 8.9 - 10.3 mg/dL   Total Protein 7.1 6.5 - 8.1 g/dL   Albumin 4.1 3.5 - 5.0 g/dL   AST 21 15 - 41 U/L   ALT 23 0 - 44 U/L   Alkaline Phosphatase  77 38 - 126 U/L   Total Bilirubin 0.4 0.3 - 1.2 mg/dL   GFR, Estimated >60 >60 mL/min   Anion gap 8 5 - 15  CBC with Differential/Platelet     Status: Abnormal   Collection Time: 05/02/21 11:43 PM  Result Value Ref Range   WBC 11.7 (H) 4.0 - 10.5 K/uL   RBC 5.18 4.22 - 5.81 MIL/uL   Hemoglobin 15.6 13.0 - 17.0 g/dL   HCT 43.1 39.0 - 52.0 %   MCV 83.2 80.0 - 100.0 fL   MCH 30.1 26.0 - 34.0 pg   MCHC 36.2 (H) 30.0 - 36.0 g/dL   RDW 12.7 11.5 - 15.5 %   Platelets 235 150 - 400 K/uL   nRBC 0.0 0.0 - 0.2 %   Neutrophils Relative % 64 %   Neutro Abs 7.6 1.7 - 7.7 K/uL   Lymphocytes Relative 22 %   Lymphs Abs 2.5 0.7 - 4.0 K/uL   Monocytes Relative 10 %   Monocytes Absolute 1.2 (H) 0.1 - 1.0 K/uL   Eosinophils Relative 3 %   Eosinophils Absolute 0.3 0.0 - 0.5 K/uL   Basophils Relative 1 %   Basophils Absolute 0.1 0.0 - 0.1 K/uL   Immature Granulocytes 0 %   Abs Immature Granulocytes 0.04 0.00 - 0.07 K/uL   Imaging Studies: No results found.  ED COURSE and MDM  Nursing notes, initial and subsequent vitals signs, including pulse oximetry, reviewed and interpreted by myself.  Vitals:   05/03/21 0115 05/03/21 0130 05/03/21 0145 05/03/21 0200  BP: (!) 138/93 (!) 143/86 (!) 138/99 (!) 146/93  Pulse: 79 72 76 80  Resp:      Temp:      TempSrc:      SpO2: 93% 96% 96% 96%   Weight:      Height:       Medications  promethazine (PHENERGAN) 25 mg in sodium chloride 0.9 % 50 mL IVPB (0 mg Intravenous Stopped 05/03/21 0008)  promethazine (PHENERGAN) 25 MG/ML injection (0 mg  Hold 05/02/21 2353)  oxyCODONE (Oxy IR/ROXICODONE) immediate release tablet 10 mg (has no administration in time range)  sodium chloride 0.9 % bolus 1,000 mL (0 mLs Intravenous Stopped 05/03/21 0204)  HYDROmorphone (DILAUDID) injection 1 mg (1 mg Intravenous Given 05/03/21 0106)   2:03 AM Nausea controlled, pain, improved. No emesis in ED.   PROCEDURES  Procedures   ED DIAGNOSES     ICD-10-CM   1. Chronic abdominal pain  R10.9    G89.29     2. Nausea and vomiting in adult  R11.2          Shanon Rosser, MD 05/03/21 [id number]

## 2021-05-03 LAB — COMPREHENSIVE METABOLIC PANEL
ALT: 23 U/L (ref 0–44)
AST: 21 U/L (ref 15–41)
Albumin: 4.1 g/dL (ref 3.5–5.0)
Alkaline Phosphatase: 77 U/L (ref 38–126)
Anion gap: 8 (ref 5–15)
BUN: 13 mg/dL (ref 6–20)
CO2: 22 mmol/L (ref 22–32)
Calcium: 9 mg/dL (ref 8.9–10.3)
Chloride: 107 mmol/L (ref 98–111)
Creatinine, Ser: 0.83 mg/dL (ref 0.61–1.24)
GFR, Estimated: >60 mL/min (ref >60)
Glucose, Bld: 114 mg/dL — ABNORMAL HIGH (ref 70–99)
Potassium: 3.3 mmol/L — ABNORMAL LOW (ref 3.5–5.1)
Sodium: 137 mmol/L (ref 135–145)
Total Bilirubin: 0.4 mg/dL (ref 0.3–1.2)
Total Protein: 7.1 g/dL (ref 6.5–8.1)

## 2021-05-03 LAB — LIPASE, BLOOD: Lipase: 31 U/L (ref 11–51)

## 2021-05-03 MED ORDER — OXYCODONE HCL 5 MG PO TABS
10.0000 mg | ORAL_TABLET | Freq: Once | ORAL | Status: AC
  Administered 2021-05-03: 10 mg via ORAL
  Filled 2021-05-03: qty 2

## 2021-05-03 NOTE — ED Notes (Signed)
Patient discharged to home.  All discharge instructions reviewed.  Patient verbalized understanding via teachback method.  VS WDL.  Respirations even and unlabored.  Ambulatory out of ED.   °

## 2021-05-04 ENCOUNTER — Encounter (HOSPITAL_BASED_OUTPATIENT_CLINIC_OR_DEPARTMENT_OTHER): Payer: Self-pay

## 2021-05-04 ENCOUNTER — Emergency Department (HOSPITAL_BASED_OUTPATIENT_CLINIC_OR_DEPARTMENT_OTHER)
Admission: EM | Admit: 2021-05-04 | Discharge: 2021-05-04 | Disposition: A | Discharge status: Home / Self Care | Payer: BLUE CROSS/BLUE SHIELD | Attending: Student | Admitting: Student

## 2021-05-04 ENCOUNTER — Emergency Department (HOSPITAL_BASED_OUTPATIENT_CLINIC_OR_DEPARTMENT_OTHER): Payer: BLUE CROSS/BLUE SHIELD

## 2021-05-04 ENCOUNTER — Other Ambulatory Visit: Payer: Self-pay

## 2021-05-04 DIAGNOSIS — E876 Hypokalemia: Secondary | ICD-10-CM | POA: Diagnosis not present

## 2021-05-04 DIAGNOSIS — F17210 Nicotine dependence, cigarettes, uncomplicated: Secondary | ICD-10-CM | POA: Insufficient documentation

## 2021-05-04 DIAGNOSIS — R1012 Left upper quadrant pain: Secondary | ICD-10-CM | POA: Diagnosis not present

## 2021-05-04 DIAGNOSIS — R1111 Vomiting without nausea: Secondary | ICD-10-CM | POA: Diagnosis not present

## 2021-05-04 DIAGNOSIS — I10 Essential (primary) hypertension: Secondary | ICD-10-CM | POA: Insufficient documentation

## 2021-05-04 DIAGNOSIS — I700 Atherosclerosis of aorta: Secondary | ICD-10-CM | POA: Diagnosis not present

## 2021-05-04 DIAGNOSIS — Z9049 Acquired absence of other specified parts of digestive tract: Secondary | ICD-10-CM | POA: Diagnosis not present

## 2021-05-04 DIAGNOSIS — Z79899 Other long term (current) drug therapy: Secondary | ICD-10-CM | POA: Diagnosis not present

## 2021-05-04 LAB — URINALYSIS, ROUTINE W REFLEX MICROSCOPIC
Bilirubin Urine: NEGATIVE
Glucose, UA: NEGATIVE mg/dL
Hgb urine dipstick: NEGATIVE
Ketones, ur: NEGATIVE mg/dL
Leukocytes,Ua: NEGATIVE
Nitrite: NEGATIVE
Protein, ur: NEGATIVE mg/dL
Specific Gravity, Urine: 1.01 (ref 1.005–1.030)
pH: 5 (ref 5.0–8.0)

## 2021-05-04 LAB — CBC WITH DIFFERENTIAL/PLATELET
Abs Immature Granulocytes: 0.08 10*3/uL — ABNORMAL HIGH (ref 0.00–0.07)
Basophils Absolute: 0.1 10*3/uL (ref 0.0–0.1)
Basophils Relative: 1 %
Eosinophils Absolute: 0.4 10*3/uL (ref 0.0–0.5)
Eosinophils Relative: 4 %
HCT: 40.7 % (ref 39.0–52.0)
Hemoglobin: 14.4 g/dL (ref 13.0–17.0)
Immature Granulocytes: 1 %
Lymphocytes Relative: 31 %
Lymphs Abs: 3.1 10*3/uL (ref 0.7–4.0)
MCH: 29.8 pg (ref 26.0–34.0)
MCHC: 35.4 g/dL (ref 30.0–36.0)
MCV: 84.3 fL (ref 80.0–100.0)
Monocytes Absolute: 1.2 10*3/uL — ABNORMAL HIGH (ref 0.1–1.0)
Monocytes Relative: 12 %
Neutro Abs: 5.2 10*3/uL (ref 1.7–7.7)
Neutrophils Relative %: 51 %
Platelets: 217 10*3/uL (ref 150–400)
RBC: 4.83 MIL/uL (ref 4.22–5.81)
RDW: 12.9 % (ref 11.5–15.5)
WBC: 10 10*3/uL (ref 4.0–10.5)
nRBC: 0 % (ref 0.0–0.2)

## 2021-05-04 LAB — COMPREHENSIVE METABOLIC PANEL
ALT: 21 U/L (ref 0–44)
AST: 23 U/L (ref 15–41)
Albumin: 3.8 g/dL (ref 3.5–5.0)
Alkaline Phosphatase: 81 U/L (ref 38–126)
Anion gap: 8 (ref 5–15)
BUN: 11 mg/dL (ref 6–20)
CO2: 21 mmol/L — ABNORMAL LOW (ref 22–32)
Calcium: 9.1 mg/dL (ref 8.9–10.3)
Chloride: 112 mmol/L — ABNORMAL HIGH (ref 98–111)
Creatinine, Ser: 0.72 mg/dL (ref 0.61–1.24)
GFR, Estimated: >60 mL/min (ref >60)
Glucose, Bld: 123 mg/dL — ABNORMAL HIGH (ref 70–99)
Potassium: 3.3 mmol/L — ABNORMAL LOW (ref 3.5–5.1)
Sodium: 141 mmol/L (ref 135–145)
Total Bilirubin: 0.4 mg/dL (ref 0.3–1.2)
Total Protein: 6.6 g/dL (ref 6.5–8.1)

## 2021-05-04 LAB — LIPASE, BLOOD: Lipase: 37 U/L (ref 11–51)

## 2021-05-04 MED ORDER — HYDROMORPHONE HCL 1 MG/ML IJ SOLN
1.0000 mg | Freq: Once | INTRAMUSCULAR | Status: AC
  Administered 2021-05-04: 1 mg via INTRAVENOUS
  Filled 2021-05-04: qty 1

## 2021-05-04 MED ORDER — ONDANSETRON HCL 4 MG/2ML IJ SOLN
4.0000 mg | Freq: Once | INTRAMUSCULAR | Status: AC
  Administered 2021-05-04: 4 mg via INTRAVENOUS
  Filled 2021-05-04: qty 2

## 2021-05-04 MED ORDER — LACTATED RINGERS IV BOLUS
1000.0000 mL | Freq: Once | INTRAVENOUS | Status: AC
  Administered 2021-05-04: 1000 mL via INTRAVENOUS

## 2021-05-04 NOTE — ED Provider Notes (Signed)
[facility] Provider Note   CSN: [id number] Arrival date & time: 05/04/21  1000     History Chief Complaint  Patient presents with   Abdominal Pain    [name] is a 45 y.o. male who presents with concern for epigastric and left upper quadrant pain x5 days.  Patient has been seen in the ER 2 days ago for same symptoms with reassuring laboratory studies.  Patient has history of chronic pancreatitis secondary to complication from surgery in the past during time of sphincterotomy and pancreatic stent placement.  Additionally history of QT prolongation.  Denies any diarrhea but does endorse nausea and intractable vomiting.  Phenergan at home is not helping.  Unable to take his at home oxycodone.  States this pain feels notably different from his pancreatitis pain.  Upon chart review patient has history of hives with iodinated contrast agents; he has tolerated contrasted CTs in the last couple of years without difficulty.  Unable to speak to whether or not he required pretreatment.  He did recently undergo ERCP On 03/04/2021 with Dr. Benson Norway which revealed patent biliary sphincteroplasty.  HPI     Past Medical History:  Diagnosis Date   Chronic abdominal pain    Diverticulitis    Drug-seeking behavior    Hypertension    Kidney stones    Pancreatitis, chronic ([place]) 05/28/2005    Patient Active Problem List   Diagnosis Date Noted   Acute cholangitis 03/03/2021   Diverticulitis 11/25/2020   Kidney stones 11/25/2020   Hyperlipidemia, mixed 03/01/2020   Abnormal magnetic resonance cholangiopancreatography (MRCP)    Choledocholithiasis    E coli bacteremia    Bacteremia due to Enterobacter species    Bacteremia 12/27/2019   QT prolongation 12/27/2019   Nausea without vomiting    Chronic pain disorder 08/29/2018   Back pain, chronic 08/29/2018   Nausea and vomiting in adult 02/25/2018   Hypertension, essential, benign 01/18/2018   Chronic  biliary pancreatitis ([place]) 01/18/2018   Chronic abdominal pain 01/14/2018    Past Surgical History:  Procedure Laterality Date   APPENDECTOMY     CHOLECYSTECTOMY     ERCP N/A 12/30/2019   Procedure: ENDOSCOPIC RETROGRADE CHOLANGIOPANCREATOGRAPHY (ERCP);  Surgeon: Milus Banister, MD;  Location: [facility];  Service: Endoscopy;  Laterality: N/A;   ERCP N/A 03/04/2021   Procedure: ENDOSCOPIC RETROGRADE CHOLANGIOPANCREATOGRAPHY (ERCP);  Surgeon: Carol Ada, MD;  Location: [facility];  Service: Endoscopy;  Laterality: N/A;   ERCP W/ METAL STENT PLACEMENT     KIDNEY STONE SURGERY     REMOVAL OF STONES  12/30/2019   Procedure: REMOVAL OF STONES;  Surgeon: Milus Banister, MD;  Location: [facility];  Service: Endoscopy;;   REMOVAL OF STONES  03/04/2021   Procedure: REMOVAL OF STONES;  Surgeon: Carol Ada, MD;  Location: [facility];  Service: Endoscopy;;   SPHINCTEROTOMY  03/04/2021   Procedure: Joan Mayans;  Surgeon: Carol Ada, MD;  Location: [facility];  Service: Endoscopy;;       Family History  Problem Relation Age of Onset   Breast cancer Mother    Early death Mother    Hypertension Mother    Heart failure Father    Hypertension Father    Heart disease Father    Hyperlipidemia Father    Pancreatic cancer Paternal Grandmother        possibly   Pancreatic cancer Paternal Aunt    Colon cancer Neg Hx     Social History   Tobacco  Use   Smoking status: Every Day    Packs/day: 0.50    Types: Cigarettes   Smokeless tobacco: Never  Vaping Use   Vaping Use: Never used  Substance Use Topics   Alcohol use: Not Currently    Comment: one beer tonight   Drug use: No    Home Medications Prior to Admission medications   Medication Sig Start Date End Date Taking? Authorizing Provider  amLODipine (NORVASC) 5 MG tablet Take 1 tablet (5 mg total) by mouth daily. 04/29/21   Martinique, Betty G, MD  benazepril (LOTENSIN) 40 MG tablet Take 1 tablet (40 mg total) by mouth  daily. 04/29/21   Martin[place], Betty G, MD  fluticasone (FLONASE) 50 MCG/ACT nasal spray Place 1 spray into both nostrils 2 (two) times daily. Patient taking differently: Place 1 spray into both nostrils daily as needed for allergies. 04/03/19   [place], Betty G, MD  metoprolol succinate (TOPROL-XL) 100 MG 24 hr tablet TAKE 1 TABLET BY MOUTH DAILY. TAKE WITH OR IMMEDIATELY FOLLOWING A MEAL. 05/01/21   [place], Betty G, MD  naloxone [facility]) nasal spray 4 mg/0.1 mL 1 spray in each nostril x 1 if opioid overdose. 06/08/20   [place], Betty G, MD  ondansetron (ZOFRAN ODT) 4 MG disintegrating tablet Place 1 tablet under the tongue every 12 hours PRN nausea 04/03/21   [place], Betty G, MD  oxyCODONE (OXY IR/ROXICODONE) 5 MG immediate release tablet Take 1 tablet (5 mg total) by mouth every 12 (twelve) hours as needed for up to 10 days for moderate pain or severe pain. 04/28/21 05/08/21  [place], Betty G, MD  pantoprazole (PROTONIX) 40 MG tablet Take 1 tablet (40 mg total) by mouth daily. Patient taking differently: Take 40 mg by mouth daily as needed (heart burn). 12/25/20   Lajean Saver, MD  potassium chloride SA (KLOR-CON) 20 MEQ tablet Take 1 tablet (20 mEq total) by mouth 3 (three) times daily. 79/89/21   Delora Fuel, MD  promethazine (PHENERGAN) 25 MG suppository Place 1 suppository (25 mg total) rectally every 6 (six) hours as needed for nausea or vomiting. 19/41/74   Delora Fuel, MD  promethazine (PHENERGAN) 25 MG tablet Take 1 tablet (25 mg total) by mouth every 6 (six) hours as needed. 01/09/47   Delora Fuel, MD    Allergies    Cortisone, Eggs or egg-derived products, Ketorolac, Ketorolac tromethamine, Haldol [haloperidol lactate], Haloperidol, Hydrocortisone, Iodinated diagnostic agents, Ketorolac tromethamine, Reglan [metoclopramide], and Compazine [prochlorperazine]  Review of Systems   Review of Systems  Constitutional:  Positive for appetite change. Negative for activity change, fatigue and fever.   HENT: Negative.    Respiratory: Negative.    Cardiovascular: Negative.   Gastrointestinal:  Positive for abdominal pain, nausea and vomiting. Negative for constipation and diarrhea.  Genitourinary: Negative.   Musculoskeletal: Negative.   Skin: Negative.   Neurological: Negative.    Physical Exam Updated Vital Signs BP (!) 154/102   Pulse 73   Temp 97.9 F (36.6 C) (Oral)   Resp 17   Ht 6\' 1"  (1.854 m)   Wt 95.3 kg   SpO2 96%   BMI 27.71 kg/m   Physical Exam Vitals and nursing note reviewed.  Constitutional:      Appearance: He is not ill-appearing or toxic-appearing.  HENT:     Head: Normocephalic and atraumatic.     Mouth/Throat:     Mouth: Mucous membranes are moist.     Pharynx: No oropharyngeal exudate or posterior oropharyngeal erythema.  Eyes:  General:        Right eye: No discharge.        Left eye: No discharge.     Conjunctiva/sclera: Conjunctivae normal.  Cardiovascular:     Rate and Rhythm: Normal rate and regular rhythm.     Pulses: Normal pulses.     Heart sounds: Normal heart sounds. No murmur heard. Pulmonary:     Effort: Pulmonary effort is normal. No respiratory distress.     Breath sounds: Normal breath sounds. No wheezing or rales.  Abdominal:     General: Bowel sounds are normal. There is no distension.     Palpations: Abdomen is soft.     Tenderness: There is abdominal tenderness in the epigastric area and left upper quadrant. There is no right CVA tenderness, left CVA tenderness, guarding or rebound.     Hernia: No hernia is present.  Musculoskeletal:        General: No deformity.     Cervical back: Neck supple.  Skin:    General: Skin is warm and dry.     Capillary Refill: Capillary refill takes less than 2 seconds.  Neurological:     General: No focal deficit present.     Mental Status: He is alert. Mental status is at baseline.  Psychiatric:        Mood and Affect: Mood normal.    ED Results / Procedures / Treatments    Labs (all labs ordered are listed, but only abnormal results are displayed) Labs Reviewed  CBC WITH DIFFERENTIAL/PLATELET - Abnormal; Notable for the following components:      Result Value   Monocytes Absolute 1.2 (*)    Abs Immature Granulocytes 0.08 (*)    All other components within normal limits  COMPREHENSIVE METABOLIC PANEL - Abnormal; Notable for the following components:   Potassium 3.3 (*)    Chloride 112 (*)    CO2 21 (*)    Glucose, Bld 123 (*)    All other components within normal limits  LIPASE, BLOOD  URINALYSIS, ROUTINE W REFLEX MICROSCOPIC    EKG None  Radiology CT ABDOMEN PELVIS WO CONTRAST  Result Date: 05/04/2021 CLINICAL DATA:  45 year old male with history of left upper quadrant abdominal pain since Sunday, with some associated nausea and vomiting. EXAM: CT ABDOMEN AND PELVIS WITHOUT CONTRAST TECHNIQUE: Multidetector CT imaging of the abdomen and pelvis was performed following the standard protocol without IV contrast. COMPARISON:  CT the abdomen and pelvis 04/01/2021. FINDINGS: Lower chest: Unremarkable. Hepatobiliary: No definite suspicious cystic or solid hepatic lesions are confidently identified on today's noncontrast CT examination. Status post cholecystectomy. Extensive pneumobilia, similar to the prior study, presumably from prior sphincterotomy. Pancreas: No definite pancreatic mass or peripancreatic fluid collections or inflammatory changes are noted on today's noncontrast CT examination. Spleen: Unremarkable. Adrenals/Urinary Tract: No calcifications are identified within the collecting system of either kidney, along the course of either ureter, or within the lumen of the urinary bladder. No hydroureteronephrosis. Unenhanced appearance of the kidneys, bilateral adrenal glands and urinary bladder is normal. Stomach/Bowel: Unenhanced appearance of the stomach is normal. There is no pathologic dilatation of small bowel or colon. Status post appendectomy.  Vascular/Lymphatic: Aortic atherosclerosis. No lymphadenopathy noted in the abdomen or pelvis. Reproductive: Coarse calcifications in the prostate gland. Seminal vesicles are unremarkable in appearance. Other: No significant volume of ascites.  No pneumoperitoneum. Musculoskeletal: There are no aggressive appearing lytic or blastic lesions noted in the visualized portions of the skeleton. IMPRESSION: 1. No acute findings are  noted in the abdomen or pelvis to account for the patient's symptoms. 2. Aortic atherosclerosis. 3. Additional incidental findings, similar to the prior study, as above. Electronically Signed   By: Vinnie Langton M.D.   On: 05/04/2021 11:38    Procedures Procedures   Medications Ordered in ED Medications  HYDROmorphone (DILAUDID) injection 1 mg (1 mg Intravenous Given 05/04/21 1114)  HYDROmorphone (DILAUDID) injection 1 mg (1 mg Intravenous Given 05/04/21 1236)  ondansetron (ZOFRAN) injection 4 mg (4 mg Intravenous Given 05/04/21 1236)  lactated ringers bolus 1,000 mL (0 mLs Intravenous Stopped 05/04/21 1342)  HYDROmorphone (DILAUDID) injection 1 mg (1 mg Intravenous Given 05/04/21 1457)    ED Course  I have reviewed the triage vital signs and the nursing notes.  Pertinent labs & imaging results that were available during my care of the patient were reviewed by me and considered in my medical decision making (see chart for details).    MDM Rules/Calculators/A&P                         45 year old male with history of chronic pancreatitis and upper abdominal pain following complication of biliary sphincterotomy approximately 15 years ago.  Hypertensive intake vital signs are normal.  Abdominal exam significant for epigastric and left upper quadrant tenderness to palpation without guarding or rebound.  Patient visibly uncomfortable.  Cardiopulmonary exam is normal, patient is neurovascularly intact in all 4 extremities.  CBC unremarkable, CMP with mild hypokalemia of 3.3,  otherwise unremarkable.  Lipase is normal, 37.  UA is unremarkable.  CT of the abdomen pelvis obtained without contrast due to patient's history of hives with iodinated contrast agents ; Negative for acute abnormality in the abdomen or pelvis though there is notable pneumobilia consistent with patient's recent ERCP and stent durotomy.  Patient feeling improved after IV rehydration, pain medication, and Zofran.  The exact etiology of the patient symptoms remains unclear there is not appear to be any emergent cause at this time.  He may follow-up in the outpatient setting with his general surgery and gastroenterology team.  No further work-up warranted in the ER at this time.  [name] voiced understanding of his medical evaluation and treatment plan.  Each of his questions was answered to his expressed satisfaction.  Return precautions were given.  Patient is stable and appropriate for discharge at this time.  This chart was dictated using voice recognition software, Dragon. Despite the best efforts of this provider to proofread and correct errors, errors may still occur which can change documentation meaning.   Final Clinical Impression(s) / ED Diagnoses Final diagnoses:  Left upper quadrant abdominal pain    Rx / DC Orders ED Discharge Orders     None        Aura Dials 05/04/21 1613    Teressa Lower, MD 05/04/21 [phone]

## 2021-05-04 NOTE — ED Notes (Signed)
D/c paperwork reviewed with pt, including f/u care. Pt with no questions or concerns at time of d/c. Ambulatory to ED exit, NAD.

## 2021-05-04 NOTE — ED Triage Notes (Signed)
Pt presents with abdominal pain since Saturday night. Was seen on 12/6. States pain has worsened and unable to tolerate PO. Hx of pancreatitis but does not feel the same.

## 2021-05-04 NOTE — Discharge Instructions (Signed)
You are seen in the ER today for your abdominal pain.  Your physical exam, blood work, and CT scan were very reassuring.  Please follow-up with a general surgeon and/or gastroenterologist as previously scheduled and return to the ER with any new severe symptoms.

## 2021-05-06 ENCOUNTER — Encounter (HOSPITAL_BASED_OUTPATIENT_CLINIC_OR_DEPARTMENT_OTHER): Payer: Self-pay | Admitting: Emergency Medicine

## 2021-05-06 ENCOUNTER — Emergency Department (HOSPITAL_BASED_OUTPATIENT_CLINIC_OR_DEPARTMENT_OTHER)
Admission: EM | Admit: 2021-05-06 | Discharge: 2021-05-06 | Disposition: A | Discharge status: Home / Self Care | Payer: BLUE CROSS/BLUE SHIELD | Attending: Emergency Medicine | Admitting: Emergency Medicine

## 2021-05-06 DIAGNOSIS — K861 Other chronic pancreatitis: Secondary | ICD-10-CM | POA: Insufficient documentation

## 2021-05-06 DIAGNOSIS — R1013 Epigastric pain: Secondary | ICD-10-CM | POA: Diagnosis not present

## 2021-05-06 DIAGNOSIS — Z79899 Other long term (current) drug therapy: Secondary | ICD-10-CM | POA: Insufficient documentation

## 2021-05-06 DIAGNOSIS — F17210 Nicotine dependence, cigarettes, uncomplicated: Secondary | ICD-10-CM | POA: Insufficient documentation

## 2021-05-06 DIAGNOSIS — E876 Hypokalemia: Secondary | ICD-10-CM | POA: Diagnosis not present

## 2021-05-06 DIAGNOSIS — I10 Essential (primary) hypertension: Secondary | ICD-10-CM | POA: Diagnosis not present

## 2021-05-06 DIAGNOSIS — G8929 Other chronic pain: Secondary | ICD-10-CM | POA: Diagnosis not present

## 2021-05-06 DIAGNOSIS — R109 Unspecified abdominal pain: Secondary | ICD-10-CM

## 2021-05-06 LAB — MAGNESIUM: Magnesium: 1.9 mg/dL (ref 1.7–2.4)

## 2021-05-06 LAB — URINALYSIS, ROUTINE W REFLEX MICROSCOPIC
Bilirubin Urine: NEGATIVE
Glucose, UA: NEGATIVE mg/dL
Hgb urine dipstick: NEGATIVE
Ketones, ur: NEGATIVE mg/dL
Leukocytes,Ua: NEGATIVE
Nitrite: NEGATIVE
Protein, ur: NEGATIVE mg/dL
Specific Gravity, Urine: 1.02 (ref 1.005–1.030)
pH: 7 (ref 5.0–8.0)

## 2021-05-06 LAB — COMPREHENSIVE METABOLIC PANEL
ALT: 23 U/L (ref 0–44)
AST: 22 U/L (ref 15–41)
Albumin: 4 g/dL (ref 3.5–5.0)
Alkaline Phosphatase: 80 U/L (ref 38–126)
Anion gap: 9 (ref 5–15)
BUN: 10 mg/dL (ref 6–20)
CO2: 25 mmol/L (ref 22–32)
Calcium: 9.4 mg/dL (ref 8.9–10.3)
Chloride: 103 mmol/L (ref 98–111)
Creatinine, Ser: 0.68 mg/dL (ref 0.61–1.24)
GFR, Estimated: >60 mL/min (ref >60)
Glucose, Bld: 102 mg/dL — ABNORMAL HIGH (ref 70–99)
Potassium: 3 mmol/L — ABNORMAL LOW (ref 3.5–5.1)
Sodium: 137 mmol/L (ref 135–145)
Total Bilirubin: 0.7 mg/dL (ref 0.3–1.2)
Total Protein: 7.2 g/dL (ref 6.5–8.1)

## 2021-05-06 LAB — CBC WITH DIFFERENTIAL/PLATELET
Abs Immature Granulocytes: 0.07 10*3/uL (ref 0.00–0.07)
Basophils Absolute: 0.1 10*3/uL (ref 0.0–0.1)
Basophils Relative: 0 %
Eosinophils Absolute: 0.3 10*3/uL (ref 0.0–0.5)
Eosinophils Relative: 1 %
HCT: 44.3 % (ref 39.0–52.0)
Hemoglobin: 16.3 g/dL (ref 13.0–17.0)
Immature Granulocytes: 0 %
Lymphocytes Relative: 15 %
Lymphs Abs: 2.7 10*3/uL (ref 0.7–4.0)
MCH: 30.1 pg (ref 26.0–34.0)
MCHC: 36.8 g/dL — ABNORMAL HIGH (ref 30.0–36.0)
MCV: 81.7 fL (ref 80.0–100.0)
Monocytes Absolute: 1.4 10*3/uL — ABNORMAL HIGH (ref 0.1–1.0)
Monocytes Relative: 8 %
Neutro Abs: 13.6 10*3/uL — ABNORMAL HIGH (ref 1.7–7.7)
Neutrophils Relative %: 76 %
Platelets: 234 10*3/uL (ref 150–400)
RBC: 5.42 MIL/uL (ref 4.22–5.81)
RDW: 12.8 % (ref 11.5–15.5)
WBC: 18.1 10*3/uL — ABNORMAL HIGH (ref 4.0–10.5)
nRBC: 0 % (ref 0.0–0.2)

## 2021-05-06 LAB — LIPASE, BLOOD: Lipase: 29 U/L (ref 11–51)

## 2021-05-06 MED ORDER — HYDROMORPHONE HCL 1 MG/ML IJ SOLN
INTRAMUSCULAR | Status: AC
  Filled 2021-05-06: qty 1

## 2021-05-06 MED ORDER — LACTATED RINGERS IV BOLUS
1000.0000 mL | Freq: Once | INTRAVENOUS | Status: AC
  Administered 2021-05-06: 1000 mL via INTRAVENOUS

## 2021-05-06 MED ORDER — OXYCODONE HCL 5 MG PO TABS
10.0000 mg | ORAL_TABLET | Freq: Once | ORAL | Status: AC
  Administered 2021-05-06: 10 mg via ORAL
  Filled 2021-05-06: qty 2

## 2021-05-06 MED ORDER — ONDANSETRON HCL 4 MG/2ML IJ SOLN
4.0000 mg | Freq: Once | INTRAMUSCULAR | Status: AC
  Administered 2021-05-06: 4 mg via INTRAVENOUS
  Filled 2021-05-06: qty 2

## 2021-05-06 MED ORDER — HYDROMORPHONE HCL 1 MG/ML IJ SOLN
1.0000 mg | Freq: Once | INTRAMUSCULAR | Status: AC
  Administered 2021-05-06: 1 mg via INTRAVENOUS
  Filled 2021-05-06: qty 1

## 2021-05-06 MED ORDER — DICYCLOMINE HCL 10 MG/ML IM SOLN
20.0000 mg | Freq: Once | INTRAMUSCULAR | Status: AC
  Administered 2021-05-06: 20 mg via INTRAMUSCULAR
  Filled 2021-05-06: qty 2

## 2021-05-06 MED ORDER — POTASSIUM CHLORIDE CRYS ER 20 MEQ PO TBCR: 20.0000 meq | EXTENDED_RELEASE_TABLET | Freq: Two times a day (BID) | ORAL | 0 refills | Status: DC

## 2021-05-06 MED ORDER — SODIUM CHLORIDE 0.9 % IV BOLUS
1000.0000 mL | Freq: Once | INTRAVENOUS | Status: AC
  Administered 2021-05-06: 1000 mL via INTRAVENOUS

## 2021-05-06 MED ORDER — POTASSIUM CHLORIDE 10 MEQ/100ML IV SOLN
10.0000 meq | INTRAVENOUS | Status: DC
  Administered 2021-05-06 (×2): 10 meq via INTRAVENOUS
  Filled 2021-05-06 (×2): qty 100

## 2021-05-06 NOTE — ED Notes (Signed)
ED Provider at bedside. 

## 2021-05-06 NOTE — ED Triage Notes (Signed)
Pt states continued ABX pain with nausea and emesis since last sunday

## 2021-05-06 NOTE — ED Provider Notes (Signed)
[name]   [name] Arrival date & time: 05/06/21  9563     History Chief Complaint  Patient presents with   Abdominal Pain    [name] is a 45 y.o. male.  HPI     45yo male with history of hypertension, chronic pancreatitis, chronic abdominal pain, cholangitis 02/2021, ED visit 2 days ago with CT showing no acute findings presents with concern for continued abdominal pain, nausea and vomiting.   Sick since Sunday, came to the ED Since being discharged taking oxycodone, zofran, phenergan and still vomiting 3-4 times per day. No hematemesis. No diarrhea or constipation.  No fever. LUQ and towards left side, sharp pain, eating and drinking make it worse. Constant pain.  No etoh or drugs including marijuana, does smoke cigarettes Seeing Dr. Henrene Pastor GI, has seen multiple GI providers in [place], [place] care centers  Was under pain management, reports was being transitioned to another provider in the practice.  Past Medical History:  Diagnosis Date   Chronic abdominal pain    Diverticulitis    Drug-seeking behavior    Hypertension    Kidney stones    Pancreatitis, chronic ([place]) 05/28/2005    Patient Active Problem List   Diagnosis Date Noted   Acute cholangitis 03/03/2021   Diverticulitis 11/25/2020   Kidney stones 11/25/2020   Hyperlipidemia, mixed 03/01/2020   Abnormal magnetic resonance cholangiopancreatography (MRCP)    Choledocholithiasis    E coli bacteremia    Bacteremia due to Enterobacter species    Bacteremia 12/27/2019   QT prolongation 12/27/2019   Nausea without vomiting    Chronic pain disorder 08/29/2018   Back pain, chronic 08/29/2018   Nausea and vomiting in adult 02/25/2018   Hypertension, essential, benign 01/18/2018   Chronic biliary pancreatitis ([place]) 01/18/2018   Chronic abdominal pain 01/14/2018    Past Surgical History:  Procedure Laterality Date   APPENDECTOMY      CHOLECYSTECTOMY     ERCP N/A 12/30/2019   Procedure: ENDOSCOPIC RETROGRADE CHOLANGIOPANCREATOGRAPHY (ERCP);  Surgeon: Milus Banister, MD;  Location: [facility];  Service: Endoscopy;  Laterality: N/A;   ERCP N/A 03/04/2021   Procedure: ENDOSCOPIC RETROGRADE CHOLANGIOPANCREATOGRAPHY (ERCP);  Surgeon: Carol Ada, MD;  Location: [facility];  Service: Endoscopy;  Laterality: N/A;   ERCP W/ METAL STENT PLACEMENT     KIDNEY STONE SURGERY     REMOVAL OF STONES  12/30/2019   Procedure: REMOVAL OF STONES;  Surgeon: Milus Banister, MD;  Location: [facility];  Service: Endoscopy;;   REMOVAL OF STONES  03/04/2021   Procedure: REMOVAL OF STONES;  Surgeon: Carol Ada, MD;  Location: [facility];  Service: Endoscopy;;   SPHINCTEROTOMY  03/04/2021   Procedure: Joan Mayans;  Surgeon: Carol Ada, MD;  Location: [facility];  Service: Endoscopy;;       Family History  Problem Relation Age of Onset   Breast cancer Mother    Early death Mother    Hypertension Mother    Heart failure Father    Hypertension Father    Heart disease Father    Hyperlipidemia Father    Pancreatic cancer Paternal Grandmother        possibly   Pancreatic cancer Paternal Aunt    Colon cancer Neg Hx     Social History   Tobacco Use   Smoking status: Every Day    Packs/day: 0.50    Types: Cigarettes   Smokeless tobacco: Never  Vaping Use   Vaping Use:  Never used  Substance Use Topics   Alcohol use: Not Currently    Comment: one beer tonight   Drug use: No    Home Medications Prior to Admission medications   Medication Sig Start Date End Date Taking? Authorizing Provider  amLODipine (NORVASC) 5 MG tablet Take 1 tablet (5 mg total) by mouth daily. 04/29/21  Yes [place], Betty G, MD  benazepril (LOTENSIN) 40 MG tablet Take 1 tablet (40 mg total) by mouth daily. 04/29/21  Yes [place], Betty G, MD  metoprolol succinate (TOPROL-XL) 100 MG 24 hr tablet TAKE 1 TABLET BY MOUTH DAILY. TAKE WITH OR IMMEDIATELY  FOLLOWING A MEAL. 05/01/21  Yes [place], Betty G, MD  ondansetron (ZOFRAN ODT) 4 MG disintegrating tablet Place 1 tablet under the tongue every 12 hours PRN nausea 04/03/21  Yes [place], Betty G, MD  oxyCODONE (OXY IR/ROXICODONE) 5 MG immediate release tablet Take 1 tablet (5 mg total) by mouth every 12 (twelve) hours as needed for up to 10 days for moderate pain or severe pain. 04/28/21 05/08/21 Yes [place], Betty G, MD  potassium chloride SA (KLOR-CON M) 20 MEQ tablet Take 1 tablet (20 mEq total) by mouth 2 (two) times daily for 1 day. 05/06/21 05/07/21 Yes Gareth Morgan, MD  promethazine (PHENERGAN) 25 MG tablet Take 1 tablet (25 mg total) by mouth every 6 (six) hours as needed. 62/22/97  Yes Delora Fuel, MD  fluticasone [facility]) 50 MCG/ACT nasal spray Place 1 spray into both nostrils 2 (two) times daily. Patient taking differently: Place 1 spray into both nostrils daily as needed for allergies. 04/03/19   [place], Betty G, MD  naloxone [facility]) nasal spray 4 mg/0.1 mL 1 spray in each nostril x 1 if opioid overdose. 06/08/20   [place], Betty G, MD  pantoprazole (PROTONIX) 40 MG tablet Take 1 tablet (40 mg total) by mouth daily. Patient taking differently: Take 40 mg by mouth daily as needed (heart burn). 12/25/20   Lajean Saver, MD  promethazine (PHENERGAN) 25 MG suppository Place 1 suppository (25 mg total) rectally every 6 (six) hours as needed for nausea or vomiting. 98/92/11   Delora Fuel, MD    Allergies    Cortisone, Eggs or egg-derived products, Ketorolac, Ketorolac tromethamine, Haldol [haloperidol lactate], Haloperidol, Hydrocortisone, Iodinated diagnostic agents, Ketorolac tromethamine, Reglan [metoclopramide], and Compazine [prochlorperazine]  Review of Systems   Review of Systems  Constitutional:  Positive for appetite change. Negative for fever.  HENT:  Negative for congestion and sore throat.   Respiratory:  Negative for cough and shortness of breath.   Cardiovascular:  Negative for  chest pain.  Gastrointestinal:  Positive for abdominal pain, nausea and vomiting. Negative for blood in stool and diarrhea.  Musculoskeletal:  Positive for back pain (radiates to).  Skin:  Negative for rash.  Neurological:  Negative for syncope, light-headedness and headaches.   Physical Exam Updated Vital Signs BP (!) 155/98   Pulse 93   Temp 98.1 F (36.7 C) (Oral)   Resp 16   Ht 6\' 1"  (1.854 m)   Wt 93 kg   SpO2 98%   BMI 27.05 kg/m   Physical Exam Vitals and nursing Ballard reviewed.  Constitutional:      General: He is not in acute distress.    Appearance: He is well-developed. He is not diaphoretic.  HENT:     Head: Normocephalic and atraumatic.  Eyes:     Conjunctiva/sclera: Conjunctivae normal.  Cardiovascular:     Rate and Rhythm: Normal rate and regular rhythm.  Heart sounds: Normal heart sounds. No murmur heard.   No friction rub. No gallop.  Pulmonary:     Effort: Pulmonary effort is normal. No respiratory distress.     Breath sounds: Normal breath sounds. No wheezing or rales.  Abdominal:     General: There is no distension.     Palpations: Abdomen is soft.     Tenderness: There is abdominal tenderness in the epigastric area and left upper quadrant. There is no guarding. Negative signs include Murphy's sign and McBurney's sign.  Musculoskeletal:     Cervical back: Normal range of motion.  Skin:    General: Skin is warm and dry.  Neurological:     Mental Status: He is alert and oriented to person, place, and time.    ED Results / Procedures / Treatments   Labs (all labs ordered are listed, but only abnormal results are displayed) Labs Reviewed  CBC WITH DIFFERENTIAL/PLATELET - Abnormal; Notable for the following components:      Result Value   WBC 18.1 (*)    MCHC 36.8 (*)    Neutro Abs 13.6 (*)    Monocytes Absolute 1.4 (*)    All other components within normal limits  COMPREHENSIVE METABOLIC PANEL - Abnormal; Notable for the following  components:   Potassium 3.0 (*)    Glucose, Bld 102 (*)    All other components within normal limits  LIPASE, BLOOD  URINALYSIS, ROUTINE W REFLEX MICROSCOPIC  MAGNESIUM    EKG None  Radiology No results found.  Procedures Procedures   Medications Ordered in ED Medications  sodium chloride 0.9 % bolus 1,000 mL (0 mLs Intravenous Stopped 05/06/21 0932)  ondansetron (ZOFRAN) injection 4 mg (4 mg Intravenous Given 05/06/21 0726)  HYDROmorphone (DILAUDID) injection 1 mg (1 mg Intravenous Given 05/06/21 0735)  HYDROmorphone (DILAUDID) injection 1 mg (1 mg Intravenous Given 05/06/21 0850)  lactated ringers bolus 1,000 mL (0 mLs Intravenous Stopped 05/06/21 1001)  dicyclomine (BENTYL) injection 20 mg (20 mg Intramuscular Given 05/06/21 0900)  oxyCODONE (Oxy IR/ROXICODONE) immediate release tablet 10 mg (10 mg Oral Given 05/06/21 0850)    ED Course  I have reviewed the triage vital signs and the nursing notes.  Pertinent labs & imaging results that were available during my care of the patient were reviewed by me and considered in my medical decision making (see chart for details).    MDM Rules/Calculators/A&P                             45yo male with history of hypertension, chronic pancreatitis, chronic abdominal pain, cholangitis 02/2021, ED visit 2 days ago with CT showing no acute findings presents with concern for continued abdominal pain, nausea and vomiting.   GIven continuation of symptoms from Sunday and CT without acute findings 2 days ago, do not feel additional CT imaging indicated at this time.  Labs without signs of acute biliary obstruction or pancreatitis. Does have hypokalemia. Leukocytosis present but nonspecific.  UA without infection.. Suspect exacerbation of chronic pain/chronic pancreatitis.   Given IV fulids, zofran, dilaudid in ED. Able to tolerate po, symptoms improved. Patient discharged in stable condition with understanding of reasons to return.    Final Clinical Impression(s) / ED Diagnoses Final diagnoses:  Epigastric pain  Chronic abdominal pain  Chronic pancreatitis, unspecified pancreatitis type ([place])    Rx / DC Orders ED Discharge Orders          Ordered  potassium chloride SA (KLOR-CON M) 20 MEQ tablet  2 times daily        05/06/21 0233             Gareth Morgan, MD 05/06/21 2315

## 2021-05-08 ENCOUNTER — Encounter: Payer: Self-pay | Admitting: Family Medicine

## 2021-05-08 ENCOUNTER — Other Ambulatory Visit: Payer: BLUE CROSS/BLUE SHIELD

## 2021-05-08 ENCOUNTER — Telehealth (INDEPENDENT_AMBULATORY_CARE_PROVIDER_SITE_OTHER): Payer: BLUE CROSS/BLUE SHIELD | Admitting: Family Medicine

## 2021-05-08 VITALS — Ht 73.0 in

## 2021-05-08 DIAGNOSIS — I10 Essential (primary) hypertension: Secondary | ICD-10-CM

## 2021-05-08 DIAGNOSIS — G8929 Other chronic pain: Secondary | ICD-10-CM

## 2021-05-08 DIAGNOSIS — I700 Atherosclerosis of aorta: Secondary | ICD-10-CM | POA: Insufficient documentation

## 2021-05-08 DIAGNOSIS — E782 Mixed hyperlipidemia: Secondary | ICD-10-CM

## 2021-05-08 DIAGNOSIS — E876 Hypokalemia: Secondary | ICD-10-CM | POA: Diagnosis not present

## 2021-05-08 DIAGNOSIS — R109 Unspecified abdominal pain: Secondary | ICD-10-CM

## 2021-05-08 DIAGNOSIS — G894 Chronic pain syndrome: Secondary | ICD-10-CM

## 2021-05-08 MED ORDER — POTASSIUM CHLORIDE CRYS ER 20 MEQ PO TBCR: 20.0000 meq | EXTENDED_RELEASE_TABLET | Freq: Every day | ORAL | 0 refills | Status: DC

## 2021-05-08 MED ORDER — OXYCODONE HCL 5 MG PO TABS: 5.0000 mg | ORAL_TABLET | Freq: Every day | ORAL | 0 refills | Status: DC | PRN

## 2021-05-08 NOTE — Assessment & Plan Note (Signed)
Continue K-Lor 20 mEq daily. We will recheck potassium with next blood work.

## 2021-05-08 NOTE — Progress Notes (Signed)
Virtual Visit via Video Note I connected with [name] on 05/08/21 by a video enabled telemedicine application and verified that I am speaking with the correct person using two identifiers.  Location patient: home Location provider:work office Persons participating in the virtual visit: patient, provider  I discussed the limitations of evaluation and management by telemedicine and the availability of in person appointments. The patient expressed understanding and agreed to proceed.  Chief Complaint  Patient presents with   Hospitalization Follow-up   HPI: [name] is a 45 yo male with hx of chronic pain, HTN,and HLD following on recent ED visits. Evaluated in the ED on 05/02/2021, 05/04/2021, and 05/06/2021 for abdominal pain, nausea, and vomiting. Negative for fever,chills, changes in bowel habits or urinary symptoms.  Each visit he was treated with IV hydromorphone x2 and oxycodone 10 mg x 1 total. Currently is on oxycodone 5 mg twice daily, we are weaning off medication. States that he has been taken more often than prescribed because instructed to do so by ED provider.  Following with GI, Dr Henrene Pastor. He has his chronic upper abdominal pain but also having left-sided abdominal pain. No new associated symptoms. He has not identified exacerbating or alleviating factors.  Hypokalemia: Currently he is on K-Lor 20 mEq  daily, one tab left.  Lab Results  Component Value Date   CREATININE 0.68 05/06/2021   BUN 10 05/06/2021   NA 137 05/06/2021   K 3.0 (L) 05/06/2021   CL 103 05/06/2021   CO2 25 05/06/2021   Lab Results  Component Value Date   WBC 18.1 (H) 05/06/2021   HGB 16.3 05/06/2021   HCT 44.3 05/06/2021   MCV 81.7 05/06/2021   PLT 234 05/06/2021   He has an appointment with pain management on 06/08/2021. According to patient, it was mentioned that he would benefit from general surgeon evaluation to discuss possibility of exploratory laparoscopy.  BP was elevated  at 158/98 during his last ED visit. 05/04/21 BP was 154/103.  BP readings at home when he is more comfortable and pain improved he is usually < 140/90. Currently he is on benazepril 40 mg daily, amlodipine 5 mg daily, and metoprolol succinate 100 mg daily.  Abdominal/pelvic CT on 05/04/21: No radiographic evidence of pancreatitis or other acute findings. Stable pneumobilia, consistent with prior sphincterotomy. Aortic Atherosclerosis (ICD10-I70.0).  HLD: He is not on pharmacologic treatment. + Smoker.  Lab Results  Component Value Date   CHOL 207 (H) 06/03/2015   HDL 40 06/03/2015   LDLCALC 113 (H) 06/03/2015   TRIG 270 (H) 06/03/2015   CHOLHDL 5.2 (H) 06/03/2015   ROS: See pertinent positives and negatives per HPI.  Past Medical History:  Diagnosis Date   Chronic abdominal pain    Diverticulitis    Drug-seeking behavior    Hypertension    Kidney stones    Pancreatitis, chronic ([place]) 05/28/2005    Past Surgical History:  Procedure Laterality Date   APPENDECTOMY     CHOLECYSTECTOMY     ERCP N/A 12/30/2019   Procedure: ENDOSCOPIC RETROGRADE CHOLANGIOPANCREATOGRAPHY (ERCP);  Surgeon: Milus Banister, MD;  Location: [facility];  Service: Endoscopy;  Laterality: N/A;   ERCP N/A 03/04/2021   Procedure: ENDOSCOPIC RETROGRADE CHOLANGIOPANCREATOGRAPHY (ERCP);  Surgeon: Carol Ada, MD;  Location: [facility];  Service: Endoscopy;  Laterality: N/A;   ERCP W/ METAL STENT PLACEMENT     KIDNEY STONE SURGERY     REMOVAL OF STONES  12/30/2019   Procedure: REMOVAL OF STONES;  Surgeon: Milus Banister, MD;  Location: [facility];  Service: Endoscopy;;   REMOVAL OF STONES  03/04/2021   Procedure: REMOVAL OF STONES;  Surgeon: Carol Ada, MD;  Location: [facility];  Service: Endoscopy;;   SPHINCTEROTOMY  03/04/2021   Procedure: Joan Mayans;  Surgeon: Carol Ada, MD;  Location: [facility];  Service: Endoscopy;;    Family History  Problem Relation Age of Onset   Breast  cancer Mother    Early death Mother    Hypertension Mother    Heart failure Father    Hypertension Father    Heart disease Father    Hyperlipidemia Father    Pancreatic cancer Paternal Grandmother        possibly   Pancreatic cancer Paternal Aunt    Colon cancer Neg Hx     Social History   Socioeconomic History   Marital status: Married    Spouse name: Not on file   Number of children: 1   Years of education: Not on file   Highest education level: Some college, no degree  Occupational History   Not on file  Tobacco Use   Smoking status: Every Day    Packs/day: 0.50    Types: Cigarettes   Smokeless tobacco: Never  Vaping Use   Vaping Use: Never used  Substance and Sexual Activity   Alcohol use: Not Currently    Comment: one beer tonight   Drug use: No   Sexual activity: Not on file  Other Topics Concern   Not on file  Social History Narrative   Not on file   Social Determinants of Health   Financial Resource Strain: Low Risk    Difficulty of Paying Living Expenses: Not hard at all  Food Insecurity: No Food Insecurity   Worried About Charity fundraiser in the Last Year: Never true   Ran Out of Food in the Last Year: Never true  Transportation Needs: No Transportation Needs   Lack of Transportation (Medical): No   Lack of Transportation (Non-Medical): No  Physical Activity: Insufficiently Active   Days of Exercise per Week: 1 day   Minutes of Exercise per Session: 20 min  Stress: No Stress Concern Present   Feeling of Stress : Only a little  Social Connections: Engineer, building services of Communication with Friends and Family: Three times a week   Frequency of Social Gatherings with Friends and Family: Once a week   Attends Religious Services: More than 4 times per year   Active Member of [employer] or Organizations: Yes   Attends Music therapist: More than 4 times per year   Marital Status: Married  Human resources officer Violence: Not on file    Current Outpatient Medications:    amLODipine (NORVASC) 5 MG tablet, Take 1 tablet (5 mg total) by mouth daily., Disp: 90 tablet, Rfl: 1   benazepril (LOTENSIN) 40 MG tablet, Take 1 tablet (40 mg total) by mouth daily., Disp: 90 tablet, Rfl: 1   fluticasone (FLONASE) 50 MCG/ACT nasal spray, Place 1 spray into both nostrils 2 (two) times daily. (Patient taking differently: Place 1 spray into both nostrils daily as needed for allergies.), Disp: 16 g, Rfl: 2   metoprolol succinate (TOPROL-XL) 100 MG 24 hr tablet, TAKE 1 TABLET BY MOUTH DAILY. TAKE WITH OR IMMEDIATELY FOLLOWING A MEAL., Disp: 90 tablet, Rfl: 1   naloxone (NARCAN) nasal spray 4 mg/0.1 mL, 1 spray in each nostril x 1 if opioid overdose., Disp: 1 each, Rfl: 1  ondansetron (ZOFRAN ODT) 4 MG disintegrating tablet, Place 1 tablet under the tongue every 12 hours PRN nausea, Disp: 20 tablet, Rfl: 0   oxyCODONE (OXY IR/ROXICODONE) 5 MG immediate release tablet, Take 1 tablet (5 mg total) by mouth every 12 (twelve) hours as needed for up to 10 days for moderate pain or severe pain., Disp: 20 tablet, Rfl: 0   pantoprazole (PROTONIX) 40 MG tablet, Take 1 tablet (40 mg total) by mouth daily. (Patient taking differently: Take 40 mg by mouth daily as needed (heart burn).), Disp: 30 tablet, Rfl: 0   promethazine (PHENERGAN) 25 MG suppository, Place 1 suppository (25 mg total) rectally every 6 (six) hours as needed for nausea or vomiting., Disp: 12 each, Rfl: 0   promethazine (PHENERGAN) 25 MG tablet, Take 1 tablet (25 mg total) by mouth every 6 (six) hours as needed., Disp: 45 tablet, Rfl: 1   potassium chloride SA (KLOR-CON M) 20 MEQ tablet, Take 1 tablet (20 mEq total) by mouth 2 (two) times daily for 1 day., Disp: 2 tablet, Rfl: 0  EXAM:  VITALS per patient if applicable:Ht 6\' 1"  (1.854 m)   BMI 27.05 kg/m   GENERAL: alert, oriented, appears well and in no acute distress  HEENT: atraumatic, conjunctiva clear, no obvious abnormalities on  inspection.  NECK: normal movements of the head and neck  LUNGS: on inspection no signs of respiratory distress, breathing rate appears normal, no obvious gross SOB, gasping or wheezing  CV: no obvious cyanosis  MS: moves all visible extremities without noticeable abnormality  PSYCH/NEURO: pleasant and cooperative, no obvious depression or anxiety, speech and thought processing grossly intact  ASSESSMENT AND PLAN:  Discussed the following assessment and plan:  Hypertension, essential, benign Home BP readings when pain is better controlled usually <140/90, so continue same dose of metoprolol succinate, amlodipine, and benazepril. Continue monitoring BP regularly. Low-salt diet.  Hyperlipidemia, mixed Problem has not been well controlled, last check in 05/2015. We discussed CV benefits of statins, he prefers to hold on starting medication until labs are done. Continue nonpharmacologic treatment. Lab appointment for fasting labs for next Monday.  Chronic pain disorder Problem is not well controlled. He has followed with GI and has had extensive work up. He would like a surgical consultation, referral placed. We will continue weaning off oxycodone. He has an appointment with pain management in 05/2021. He is no longer under medication contract.  Atherosclerosis of aorta ([place]) We discussed vascular findings in recent abdominal CT. He prefers to hold on statin medication for now, recommendation will be made after lipid panel results are back. Aspirin 81 mg EC daily recommended, we discussed some side effects. Stressed the importance of smoking cessation.  Hypokalemia Continue K-Lor 20 mEq daily. We will recheck potassium with next blood work.  Chronic pain disorder - Plan: oxyCODONE (OXY IR/ROXICODONE) 5 MG immediate release tablet Keep appt with pain management. [place] controlled substance report reviewed.  I discussed the assessment and treatment plan with the patient. The  patient was provided an opportunity to ask questions and all were answered. The patient agreed with the plan and demonstrated an understanding of the instructions.  Return if symptoms worsen or fail to improve, for Keep next appt..  Betty G. Martinique, MD  [facility] [place] office.

## 2021-05-08 NOTE — Assessment & Plan Note (Signed)
Problem is not well controlled. He has followed with GI and has had extensive work up. He would like a surgical consultation, referral placed. We will continue weaning off oxycodone. He has an appointment with pain management in 05/2021. He is no longer under medication contract.

## 2021-05-08 NOTE — Assessment & Plan Note (Signed)
We discussed vascular findings in recent abdominal CT. He prefers to hold on statin medication for now, recommendation will be made after lipid panel results are back. Aspirin 81 mg EC daily recommended, we discussed some side effects. Stressed the importance of smoking cessation.

## 2021-05-08 NOTE — Assessment & Plan Note (Signed)
Home BP readings when pain is better controlled usually <140/90, so continue same dose of metoprolol succinate, amlodipine, and benazepril. Continue monitoring BP regularly. Low-salt diet.

## 2021-05-08 NOTE — Assessment & Plan Note (Signed)
Problem has not been well controlled, last check in 05/2015. We discussed CV benefits of statins, he prefers to hold on starting medication until labs are done. Continue nonpharmacologic treatment. Lab appointment for fasting labs for next Monday.

## 2021-05-11 ENCOUNTER — Other Ambulatory Visit: Payer: Self-pay

## 2021-05-11 ENCOUNTER — Emergency Department (HOSPITAL_BASED_OUTPATIENT_CLINIC_OR_DEPARTMENT_OTHER)
Admission: EM | Admit: 2021-05-11 | Discharge: 2021-05-12 | Disposition: A | Discharge status: Home / Self Care | Payer: BLUE CROSS/BLUE SHIELD | Attending: Emergency Medicine | Admitting: Emergency Medicine

## 2021-05-11 ENCOUNTER — Encounter (HOSPITAL_BASED_OUTPATIENT_CLINIC_OR_DEPARTMENT_OTHER): Payer: Self-pay | Admitting: *Deleted

## 2021-05-11 DIAGNOSIS — Z79899 Other long term (current) drug therapy: Secondary | ICD-10-CM | POA: Insufficient documentation

## 2021-05-11 DIAGNOSIS — G8929 Other chronic pain: Secondary | ICD-10-CM | POA: Diagnosis not present

## 2021-05-11 DIAGNOSIS — F17210 Nicotine dependence, cigarettes, uncomplicated: Secondary | ICD-10-CM | POA: Diagnosis not present

## 2021-05-11 DIAGNOSIS — I10 Essential (primary) hypertension: Secondary | ICD-10-CM | POA: Insufficient documentation

## 2021-05-11 DIAGNOSIS — R112 Nausea with vomiting, unspecified: Secondary | ICD-10-CM | POA: Diagnosis not present

## 2021-05-11 DIAGNOSIS — R1012 Left upper quadrant pain: Secondary | ICD-10-CM | POA: Insufficient documentation

## 2021-05-11 DIAGNOSIS — R109 Unspecified abdominal pain: Secondary | ICD-10-CM | POA: Diagnosis not present

## 2021-05-11 MED ORDER — HYDROMORPHONE HCL 1 MG/ML IJ SOLN
1.0000 mg | Freq: Once | INTRAMUSCULAR | Status: AC | PRN
  Administered 2021-05-12: 1 mg via INTRAVENOUS
  Filled 2021-05-11: qty 1

## 2021-05-11 MED ORDER — SODIUM CHLORIDE 0.9 % IV SOLN
25.0000 mg | Freq: Once | INTRAVENOUS | Status: AC
  Administered 2021-05-12: 25 mg via INTRAVENOUS
  Filled 2021-05-11: qty 1

## 2021-05-11 MED ORDER — HYDROMORPHONE HCL 1 MG/ML IJ SOLN
1.0000 mg | Freq: Once | INTRAMUSCULAR | Status: AC
  Administered 2021-05-12: 1 mg via INTRAVENOUS
  Filled 2021-05-11: qty 1

## 2021-05-11 MED ORDER — LACTATED RINGERS IV BOLUS
1000.0000 mL | Freq: Once | INTRAVENOUS | Status: AC
  Administered 2021-05-12: 1000 mL via INTRAVENOUS

## 2021-05-11 NOTE — ED Triage Notes (Signed)
C/o chronic abd pain and vomiting.

## 2021-05-11 NOTE — ED Provider Notes (Signed)
[facility] Provider Note: Georgena Spurling, MD, FACEP  CSN: [id number] MRN: [id number] ARRIVAL: 05/11/21 at 2322 ROOM: [place]  Abdominal Pain   HISTORY OF PRESENT ILLNESS  05/11/21 11:32 PM Squire Takumi Din is a 45 y.o. male with a history of chronic abdominal pain and frequent visits to the ED for same.  He is here with nausea and vomiting and an exacerbation of his pain over the past 4 days and especially the past 2 days.  His pain is located in the left upper quadrant radiating to the left flank.  He rates it as a 10 out of 10.  It is worse with movement or palpation.  His nausea and vomiting have prevented him from taking his home pain medications.   Past Medical History:  Diagnosis Date   Chronic abdominal pain    Diverticulitis    Drug-seeking behavior    Hypertension    Kidney stones    Pancreatitis, chronic ([place]) 05/28/2005    Past Surgical History:  Procedure Laterality Date   APPENDECTOMY     CHOLECYSTECTOMY     ERCP N/A 12/30/2019   Procedure: ENDOSCOPIC RETROGRADE CHOLANGIOPANCREATOGRAPHY (ERCP);  Surgeon: Milus Banister, MD;  Location: [facility];  Service: Endoscopy;  Laterality: N/A;   ERCP N/A 03/04/2021   Procedure: ENDOSCOPIC RETROGRADE CHOLANGIOPANCREATOGRAPHY (ERCP);  Surgeon: Carol Ada, MD;  Location: [facility];  Service: Endoscopy;  Laterality: N/A;   ERCP W/ METAL STENT PLACEMENT     KIDNEY STONE SURGERY     REMOVAL OF STONES  12/30/2019   Procedure: REMOVAL OF STONES;  Surgeon: Milus Banister, MD;  Location: [facility];  Service: Endoscopy;;   REMOVAL OF STONES  03/04/2021   Procedure: REMOVAL OF STONES;  Surgeon: Carol Ada, MD;  Location: [facility];  Service: Endoscopy;;   SPHINCTEROTOMY  03/04/2021   Procedure: Joan Mayans;  Surgeon: Carol Ada, MD;  Location: [facility];  Service: Endoscopy;;    Family History  Problem Relation Age of Onset   Breast cancer Mother    Early death Mother     Hypertension Mother    Heart failure Father    Hypertension Father    Heart disease Father    Hyperlipidemia Father    Pancreatic cancer Paternal Grandmother        possibly   Pancreatic cancer Paternal Aunt    Colon cancer Neg Hx     Social History   Tobacco Use   Smoking status: Every Day    Packs/day: 0.50    Types: Cigarettes   Smokeless tobacco: Never  Vaping Use   Vaping Use: Never used  Substance Use Topics   Alcohol use: Not Currently    Comment: one beer tonight   Drug use: No    Prior to Admission medications   Medication Sig Start Date End Date Taking? Authorizing Provider  amLODipine (NORVASC) 5 MG tablet Take 1 tablet (5 mg total) by mouth daily. 04/29/21   Martinique, Betty G, MD  benazepril (LOTENSIN) 40 MG tablet Take 1 tablet (40 mg total) by mouth daily. 04/29/21   Martinique, Betty G, MD  fluticasone (FLONASE) 50 MCG/ACT nasal spray Place 1 spray into both nostrils 2 (two) times daily. Patient taking differently: Place 1 spray into both nostrils daily as needed for allergies. 04/03/19   Martinique, Betty G, MD  metoprolol succinate (TOPROL-XL) 100 MG 24 hr tablet TAKE 1 TABLET BY MOUTH DAILY. TAKE WITH OR IMMEDIATELY FOLLOWING A MEAL. 05/01/21  Martinique, Betty G, MD  naloxone [facility]) nasal spray 4 mg/0.1 mL 1 spray in each nostril x 1 if opioid overdose. 06/08/20   Martinique, Betty G, MD  ondansetron (ZOFRAN ODT) 4 MG disintegrating tablet Place 1 tablet under the tongue every 12 hours PRN nausea 04/03/21   Martinique, Betty G, MD  oxyCODONE (OXY IR/ROXICODONE) 5 MG immediate release tablet Take 1 tablet (5 mg total) by mouth daily as needed for up to 20 days for moderate pain or severe pain. 05/08/21 05/28/21  Martinique, Betty G, MD  pantoprazole (PROTONIX) 40 MG tablet Take 1 tablet (40 mg total) by mouth daily. Patient taking differently: Take 40 mg by mouth daily as needed (heart burn). 12/25/20   Lajean Saver, MD  potassium chloride SA (KLOR-CON M) 20 MEQ tablet Take 1 tablet (20  mEq total) by mouth daily. 05/08/21   Martinique, Betty G, MD  promethazine (PHENERGAN) 25 MG suppository Place 1 suppository (25 mg total) rectally every 6 (six) hours as needed for nausea or vomiting. 89/21/19   Delora Fuel, MD  promethazine (PHENERGAN) 25 MG tablet Take 1 tablet (25 mg total) by mouth every 6 (six) hours as needed. 41/74/08   Delora Fuel, MD    Allergies Cortisone, Eggs or egg-derived products, Ketorolac, Ketorolac tromethamine, Haldol [haloperidol lactate], Haloperidol, Hydrocortisone, Iodinated diagnostic agents, Ketorolac tromethamine, Reglan [metoclopramide], and Compazine [prochlorperazine]   REVIEW OF SYSTEMS  Negative except as noted here or in the History of Present Illness.   PHYSICAL EXAMINATION  Initial Vital Signs Blood pressure (!) 180/120, pulse (!) 129, temperature 98.2 F (36.8 C), temperature source Oral, resp. rate 18, height 6\' 1"  (1.854 m), weight 90.7 kg, SpO2 97 %.  Examination General: Well-developed, well-nourished male in no acute distress; appearance consistent with age of record HENT: normocephalic; atraumatic Eyes: pupils equal, round and reactive to light; extraocular muscles intact Neck: supple Heart: regular rate and rhythm; tachycardia Lungs: clear to auscultation bilaterally Abdomen: soft; nondistended; left upper quadrant and left side tenderness; bowel sounds present Extremities: No deformity; full range of motion; pulses normal Neurologic: Awake, alert and oriented; motor function intact in all extremities and symmetric; no facial droop Skin: Warm and dry Psychiatric: Normal mood and affect   RESULTS  Summary of this visit's results, reviewed and interpreted by myself:   EKG Interpretation  Date/Time:    Ventricular Rate:    PR Interval:    QRS Duration:   QT Interval:    QTC Calculation:   R Axis:     Text Interpretation:         Laboratory Studies: Results for orders placed or performed during the hospital  encounter of 05/11/21 (from the past 24 hour(s))  CBC with Differential/Platelet     Status: Abnormal   Collection Time: 05/12/21 12:04 AM  Result Value Ref Range   WBC 12.0 (H) 4.0 - 10.5 K/uL   RBC 5.31 4.22 - 5.81 MIL/uL   Hemoglobin 16.2 13.0 - 17.0 g/dL   HCT 43.7 39.0 - 52.0 %   MCV 82.3 80.0 - 100.0 fL   MCH 30.5 26.0 - 34.0 pg   MCHC 37.1 (H) 30.0 - 36.0 g/dL   RDW 12.7 11.5 - 15.5 %   Platelets 256 150 - 400 K/uL   nRBC 0.0 0.0 - 0.2 %   Neutrophils Relative % 66 %   Neutro Abs 8.0 (H) 1.7 - 7.7 K/uL   Lymphocytes Relative 20 %   Lymphs Abs 2.4 0.7 - 4.0 K/uL  Monocytes Relative 9 %   Monocytes Absolute 1.1 (H) 0.1 - 1.0 K/uL   Eosinophils Relative 3 %   Eosinophils Absolute 0.3 0.0 - 0.5 K/uL   Basophils Relative 1 %   Basophils Absolute 0.1 0.0 - 0.1 K/uL   Immature Granulocytes 1 %   Abs Immature Granulocytes 0.14 (H) 0.00 - 0.07 K/uL  Comprehensive metabolic panel     Status: Abnormal   Collection Time: 05/12/21 12:04 AM  Result Value Ref Range   Sodium 138 135 - 145 mmol/L   Potassium 3.4 (L) 3.5 - 5.1 mmol/L   Chloride 107 98 - 111 mmol/L   CO2 21 (L) 22 - 32 mmol/L   Glucose, Bld 139 (H) 70 - 99 mg/dL   BUN 12 6 - 20 mg/dL   Creatinine, Ser 0.91 0.61 - 1.24 mg/dL   Calcium 9.3 8.9 - 10.3 mg/dL   Total Protein 7.1 6.5 - 8.1 g/dL   Albumin 4.0 3.5 - 5.0 g/dL   AST 30 15 - 41 U/L   ALT 34 0 - 44 U/L   Alkaline Phosphatase 105 38 - 126 U/L   Total Bilirubin 0.3 0.3 - 1.2 mg/dL   GFR, Estimated >60 >60 mL/min   Anion gap 10 5 - 15  Lipase, blood     Status: None   Collection Time: 05/12/21 12:04 AM  Result Value Ref Range   Lipase 32 11 - 51 U/L   Imaging Studies: No results found.  ED COURSE and MDM  Nursing notes, initial and subsequent vitals signs, including pulse oximetry, reviewed and interpreted by myself.  Vitals:   05/11/21 2328 05/11/21 2329 05/12/21 0000 05/12/21 0115  BP: (!) 180/120  (!) 159/102 (!) 144/119  Pulse: (!) 129  (!) 109  (!) 105  Resp: 18  15 19   Temp: 98.2 F (36.8 C)     TempSrc: Oral     SpO2: 97%  97% 97%  Weight:  90.7 kg    Height:  6\' 1"  (1.854 m)     Medications  promethazine (PHENERGAN) 25 MG/ML injection (has no administration in time range)  lactated ringers bolus 1,000 mL (0 mLs Intravenous Stopped 05/12/21 0122)  promethazine (PHENERGAN) 25 mg in sodium chloride 0.9 % 50 mL IVPB (0 mg Intravenous Stopped 05/12/21 0122)  HYDROmorphone (DILAUDID) injection 1 mg (1 mg Intravenous Given 05/12/21 0013)  HYDROmorphone (DILAUDID) injection 1 mg (1 mg Intravenous Given 05/12/21 0129)  lactated ringers bolus 1,000 mL (0 mLs Intravenous Stopped 05/12/21 0208)   2:08 AM Patient feeling better after IV medications and fluids.  Heart rate has improved.   PROCEDURES  Procedures   ED DIAGNOSES     ICD-10-CM   1. Chronic abdominal pain  R10.9    G89.29     2. Nausea and vomiting in adult  R11.2          Shanon Rosser, MD 05/12/21 [phone]

## 2021-05-12 LAB — CBC WITH DIFFERENTIAL/PLATELET
Abs Immature Granulocytes: 0.14 10*3/uL — ABNORMAL HIGH (ref 0.00–0.07)
Basophils Absolute: 0.1 10*3/uL (ref 0.0–0.1)
Basophils Relative: 1 %
Eosinophils Absolute: 0.3 10*3/uL (ref 0.0–0.5)
Eosinophils Relative: 3 %
HCT: 43.7 % (ref 39.0–52.0)
Hemoglobin: 16.2 g/dL (ref 13.0–17.0)
Immature Granulocytes: 1 %
Lymphocytes Relative: 20 %
Lymphs Abs: 2.4 10*3/uL (ref 0.7–4.0)
MCH: 30.5 pg (ref 26.0–34.0)
MCHC: 37.1 g/dL — ABNORMAL HIGH (ref 30.0–36.0)
MCV: 82.3 fL (ref 80.0–100.0)
Monocytes Absolute: 1.1 10*3/uL — ABNORMAL HIGH (ref 0.1–1.0)
Monocytes Relative: 9 %
Neutro Abs: 8 10*3/uL — ABNORMAL HIGH (ref 1.7–7.7)
Neutrophils Relative %: 66 %
Platelets: 256 10*3/uL (ref 150–400)
RBC: 5.31 MIL/uL (ref 4.22–5.81)
RDW: 12.7 % (ref 11.5–15.5)
WBC: 12 10*3/uL — ABNORMAL HIGH (ref 4.0–10.5)
nRBC: 0 % (ref 0.0–0.2)

## 2021-05-12 LAB — COMPREHENSIVE METABOLIC PANEL
ALT: 34 U/L (ref 0–44)
AST: 30 U/L (ref 15–41)
Albumin: 4 g/dL (ref 3.5–5.0)
Alkaline Phosphatase: 105 U/L (ref 38–126)
Anion gap: 10 (ref 5–15)
BUN: 12 mg/dL (ref 6–20)
CO2: 21 mmol/L — ABNORMAL LOW (ref 22–32)
Calcium: 9.3 mg/dL (ref 8.9–10.3)
Chloride: 107 mmol/L (ref 98–111)
Creatinine, Ser: 0.91 mg/dL (ref 0.61–1.24)
GFR, Estimated: >60 mL/min (ref >60)
Glucose, Bld: 139 mg/dL — ABNORMAL HIGH (ref 70–99)
Potassium: 3.4 mmol/L — ABNORMAL LOW (ref 3.5–5.1)
Sodium: 138 mmol/L (ref 135–145)
Total Bilirubin: 0.3 mg/dL (ref 0.3–1.2)
Total Protein: 7.1 g/dL (ref 6.5–8.1)

## 2021-05-12 LAB — LIPASE, BLOOD: Lipase: 32 U/L (ref 11–51)

## 2021-05-12 MED ORDER — LACTATED RINGERS IV BOLUS
1000.0000 mL | Freq: Once | INTRAVENOUS | Status: AC
  Administered 2021-05-12: 1000 mL via INTRAVENOUS

## 2021-05-12 MED ORDER — PROMETHAZINE HCL 25 MG/ML IJ SOLN
INTRAMUSCULAR | Status: AC
  Filled 2021-05-12: qty 1

## 2021-05-16 ENCOUNTER — Encounter (HOSPITAL_BASED_OUTPATIENT_CLINIC_OR_DEPARTMENT_OTHER): Payer: Self-pay | Admitting: Emergency Medicine

## 2021-05-16 ENCOUNTER — Other Ambulatory Visit (INDEPENDENT_AMBULATORY_CARE_PROVIDER_SITE_OTHER): Payer: BLUE CROSS/BLUE SHIELD

## 2021-05-16 ENCOUNTER — Other Ambulatory Visit: Payer: Self-pay

## 2021-05-16 ENCOUNTER — Emergency Department (HOSPITAL_BASED_OUTPATIENT_CLINIC_OR_DEPARTMENT_OTHER)
Admission: EM | Admit: 2021-05-16 | Discharge: 2021-05-16 | Disposition: A | Discharge status: Home / Self Care | Payer: BLUE CROSS/BLUE SHIELD | Attending: Emergency Medicine | Admitting: Emergency Medicine

## 2021-05-16 DIAGNOSIS — R1012 Left upper quadrant pain: Secondary | ICD-10-CM | POA: Diagnosis not present

## 2021-05-16 DIAGNOSIS — R1013 Epigastric pain: Secondary | ICD-10-CM | POA: Diagnosis not present

## 2021-05-16 DIAGNOSIS — E876 Hypokalemia: Secondary | ICD-10-CM | POA: Diagnosis not present

## 2021-05-16 DIAGNOSIS — Z7951 Long term (current) use of inhaled steroids: Secondary | ICD-10-CM | POA: Diagnosis not present

## 2021-05-16 DIAGNOSIS — F17210 Nicotine dependence, cigarettes, uncomplicated: Secondary | ICD-10-CM | POA: Insufficient documentation

## 2021-05-16 DIAGNOSIS — D72829 Elevated white blood cell count, unspecified: Secondary | ICD-10-CM | POA: Diagnosis not present

## 2021-05-16 DIAGNOSIS — R110 Nausea: Secondary | ICD-10-CM | POA: Insufficient documentation

## 2021-05-16 DIAGNOSIS — E782 Mixed hyperlipidemia: Secondary | ICD-10-CM | POA: Diagnosis not present

## 2021-05-16 DIAGNOSIS — I10 Essential (primary) hypertension: Secondary | ICD-10-CM | POA: Insufficient documentation

## 2021-05-16 LAB — URINALYSIS, ROUTINE W REFLEX MICROSCOPIC
Bilirubin Urine: NEGATIVE
Glucose, UA: NEGATIVE mg/dL
Hgb urine dipstick: NEGATIVE
Ketones, ur: NEGATIVE mg/dL
Leukocytes,Ua: NEGATIVE
Nitrite: NEGATIVE
Protein, ur: 30 mg/dL — AB
Specific Gravity, Urine: 1.027 (ref 1.005–1.030)
pH: 5 (ref 5.0–8.0)

## 2021-05-16 LAB — LIPID PANEL
Cholesterol: 246 mg/dL — ABNORMAL HIGH (ref 0–200)
HDL: 47.2 mg/dL (ref >39.00)
NonHDL: 199.17
Total CHOL/HDL Ratio: 5
Triglycerides: 273 mg/dL — ABNORMAL HIGH (ref 0.0–149.0)
VLDL: 54.6 mg/dL — ABNORMAL HIGH (ref 0.0–40.0)

## 2021-05-16 LAB — COMPREHENSIVE METABOLIC PANEL
ALT: 35 U/L (ref 0–44)
AST: 30 U/L (ref 15–41)
Albumin: 4.5 g/dL (ref 3.5–5.0)
Alkaline Phosphatase: 78 U/L (ref 38–126)
Anion gap: 10 (ref 5–15)
BUN: 14 mg/dL (ref 6–20)
CO2: 25 mmol/L (ref 22–32)
Calcium: 9.4 mg/dL (ref 8.9–10.3)
Chloride: 105 mmol/L (ref 98–111)
Creatinine, Ser: 0.94 mg/dL (ref 0.61–1.24)
GFR, Estimated: >60 mL/min (ref >60)
Glucose, Bld: 144 mg/dL — ABNORMAL HIGH (ref 70–99)
Potassium: 3.3 mmol/L — ABNORMAL LOW (ref 3.5–5.1)
Sodium: 140 mmol/L (ref 135–145)
Total Bilirubin: 0.5 mg/dL (ref 0.3–1.2)
Total Protein: 7.4 g/dL (ref 6.5–8.1)

## 2021-05-16 LAB — CBC
HCT: 46.2 % (ref 39.0–52.0)
Hemoglobin: 15.8 g/dL (ref 13.0–17.0)
MCH: 29.5 pg (ref 26.0–34.0)
MCHC: 34.2 g/dL (ref 30.0–36.0)
MCV: 86.4 fL (ref 80.0–100.0)
Platelets: 223 10*3/uL (ref 150–400)
RBC: 5.35 MIL/uL (ref 4.22–5.81)
RDW: 13.2 % (ref 11.5–15.5)
WBC: 12.6 10*3/uL — ABNORMAL HIGH (ref 4.0–10.5)
nRBC: 0 % (ref 0.0–0.2)

## 2021-05-16 LAB — LIPASE, BLOOD: Lipase: 44 U/L (ref 11–51)

## 2021-05-16 LAB — LDL CHOLESTEROL, DIRECT: Direct LDL: 170 mg/dL

## 2021-05-16 LAB — POTASSIUM: Potassium: 3.3 mEq/L — ABNORMAL LOW (ref 3.5–5.1)

## 2021-05-16 LAB — MAGNESIUM: Magnesium: 2.1 mg/dL (ref 1.7–2.4)

## 2021-05-16 MED ORDER — LACTATED RINGERS IV BOLUS
1000.0000 mL | Freq: Once | INTRAVENOUS | Status: AC
  Administered 2021-05-16: 13:00:00 1000 mL via INTRAVENOUS

## 2021-05-16 MED ORDER — LIDOCAINE VISCOUS HCL 2 % MT SOLN
15.0000 mL | Freq: Once | OROMUCOSAL | Status: AC
  Administered 2021-05-16: 15:00:00 15 mL via ORAL
  Filled 2021-05-16: qty 15

## 2021-05-16 MED ORDER — MORPHINE SULFATE (PF) 4 MG/ML IV SOLN
4.0000 mg | Freq: Once | INTRAVENOUS | Status: AC
  Administered 2021-05-16: 13:00:00 4 mg via INTRAVENOUS
  Filled 2021-05-16: qty 1

## 2021-05-16 MED ORDER — PROMETHAZINE HCL 25 MG/ML IJ SOLN
INTRAMUSCULAR | Status: AC
  Filled 2021-05-16: qty 1

## 2021-05-16 MED ORDER — MORPHINE SULFATE (PF) 4 MG/ML IV SOLN
4.0000 mg | Freq: Once | INTRAVENOUS | Status: AC
  Administered 2021-05-16: 14:00:00 4 mg via INTRAVENOUS
  Filled 2021-05-16: qty 1

## 2021-05-16 MED ORDER — POTASSIUM CHLORIDE CRYS ER 20 MEQ PO TBCR
40.0000 meq | EXTENDED_RELEASE_TABLET | Freq: Once | ORAL | Status: AC
  Administered 2021-05-16: 15:00:00 40 meq via ORAL
  Filled 2021-05-16: qty 2

## 2021-05-16 MED ORDER — PANTOPRAZOLE SODIUM 40 MG IV SOLR
40.0000 mg | Freq: Once | INTRAVENOUS | Status: AC
  Administered 2021-05-16: 13:00:00 40 mg via INTRAVENOUS
  Filled 2021-05-16: qty 40

## 2021-05-16 MED ORDER — SODIUM CHLORIDE 0.9 % IV SOLN
12.5000 mg | Freq: Four times a day (QID) | INTRAVENOUS | Status: DC | PRN
  Administered 2021-05-16: 15:00:00 12.5 mg via INTRAVENOUS
  Filled 2021-05-16: qty 0.5

## 2021-05-16 MED ORDER — ALUM & MAG HYDROXIDE-SIMETH 200-200-20 MG/5ML PO SUSP
30.0000 mL | Freq: Once | ORAL | Status: AC
  Administered 2021-05-16: 15:00:00 30 mL via ORAL
  Filled 2021-05-16: qty 30

## 2021-05-16 MED ORDER — POTASSIUM CHLORIDE CRYS ER 20 MEQ PO TBCR: 40.0000 meq | EXTENDED_RELEASE_TABLET | Freq: Once | ORAL | Status: DC

## 2021-05-16 NOTE — ED Triage Notes (Signed)
Pt arrives to ED with c/o abdominal pain. Pain is epigastric in region. Similar to past episodes of his chronic abd pain and chronic pancreatitis. The exacerbation started x3 days ago and worsened today. Pain 7/10.

## 2021-05-16 NOTE — ED Provider Notes (Signed)
[facility] EMERGENCY DEPT Provider Note   CSN: [id number] Arrival date & time: 05/16/21  2420     History Chief Complaint  Patient presents with   Pancreatitis    [name] is a 45 y.o. male with a history of chronic abdominal pain, chronic pancreatitis, status post appendectomy, status post cholecystectomy, hypertension.  Presents emergency department with a chief complaint of epigastric and left upper quadrant abdominal pain.  States that he has been dealing with this pain intermittently over the last 2 weeks.  Pain has become worse since Sunday 12/18.  Patient has been managing his pain over the last 2 days with home medications of Zofran and oxycodone.  Earlier this morning epigastric pain became worse after eating a biscuit.  Patient rates pain 7/10 on the pain scale.  Pain is worse with touch.  Pain radiates to left upper quadrant.  Patient has not tried any medications since increasing pain.  States that pain feels similar to chronic pancreatitis episodes he has had in the past.  Patient endorses nausea.  States that he took his prescribed Zofran and oxycodone at 0 600 this morning.  Patient is prescribed Protonix but has not been taking this medication.  Patient states that he was recently started on daily potassium by his primary care provider.  Patient denies any fever, chills, abdominal distention, vomiting, constipation, diarrhea, melena, blood in stool, dysuria, hematuria, urinary urgency, swelling or tenderness to genitals, genital sores or lesions.  HPI     Past Medical History:  Diagnosis Date   Chronic abdominal pain    Diverticulitis    Drug-seeking behavior    Hypertension    Kidney stones    Pancreatitis, chronic (HCC) 05/28/2005    Patient Active Problem List   Diagnosis Date Noted   Atherosclerosis of aorta (HCC) 05/08/2021   Hypokalemia 05/08/2021   Acute cholangitis 03/03/2021   Diverticulitis 11/25/2020   Kidney stones 11/25/2020    Hyperlipidemia, mixed 03/01/2020   Abnormal magnetic resonance cholangiopancreatography (MRCP)    Choledocholithiasis    E coli bacteremia    Bacteremia due to Enterobacter species    Bacteremia 12/27/2019   QT prolongation 12/27/2019   Nausea without vomiting    Chronic pain disorder 08/29/2018   Back pain, chronic 08/29/2018   Nausea and vomiting in adult 02/25/2018   Hypertension, essential, benign 01/18/2018   Chronic biliary pancreatitis (HCC) 01/18/2018   Chronic abdominal pain 01/14/2018    Past Surgical History:  Procedure Laterality Date   APPENDECTOMY     CHOLECYSTECTOMY     ERCP N/A 12/30/2019   Procedure: ENDOSCOPIC RETROGRADE CHOLANGIOPANCREATOGRAPHY (ERCP);  Surgeon: Rachael Fee, MD;  Location: [facility];  Service: Endoscopy;  Laterality: N/A;   ERCP N/A 03/04/2021   Procedure: ENDOSCOPIC RETROGRADE CHOLANGIOPANCREATOGRAPHY (ERCP);  Surgeon: Jeani Hawking, MD;  Location: [facility];  Service: Endoscopy;  Laterality: N/A;   ERCP W/ METAL STENT PLACEMENT     KIDNEY STONE SURGERY     REMOVAL OF STONES  12/30/2019   Procedure: REMOVAL OF STONES;  Surgeon: Rachael Fee, MD;  Location: [facility];  Service: Endoscopy;;   REMOVAL OF STONES  03/04/2021   Procedure: REMOVAL OF STONES;  Surgeon: Jeani Hawking, MD;  Location: [facility];  Service: Endoscopy;;   SPHINCTEROTOMY  03/04/2021   Procedure: Dennison Mascot;  Surgeon: Jeani Hawking, MD;  Location: [facility];  Service: Endoscopy;;       Family History  Problem Relation Age of Onset   Breast cancer Mother  Early death Mother    Hypertension Mother    Heart failure Father    Hypertension Father    Heart disease Father    Hyperlipidemia Father    Pancreatic cancer Paternal Grandmother        possibly   Pancreatic cancer Paternal Aunt    Colon cancer Neg Hx     Social History   Tobacco Use   Smoking status: Every Day    Packs/day: 0.50    Types: Cigarettes   Smokeless tobacco: Never   Vaping Use   Vaping Use: Never used  Substance Use Topics   Alcohol use: Not Currently    Comment: one beer tonight   Drug use: No    Home Medications Prior to Admission medications   Medication Sig Start Date End Date Taking? Authorizing Provider  amLODipine (NORVASC) 5 MG tablet Take 1 tablet (5 mg total) by mouth daily. 04/29/21   Martinique, Betty G, MD  benazepril (LOTENSIN) 40 MG tablet Take 1 tablet (40 mg total) by mouth daily. 04/29/21   Martinique, Betty G, MD  fluticasone (FLONASE) 50 MCG/ACT nasal spray Place 1 spray into both nostrils 2 (two) times daily. Patient taking differently: Place 1 spray into both nostrils daily as needed for allergies. 04/03/19   Martinique, Betty G, MD  metoprolol succinate (TOPROL-XL) 100 MG 24 hr tablet TAKE 1 TABLET BY MOUTH DAILY. TAKE WITH OR IMMEDIATELY FOLLOWING A MEAL. 05/01/21   Martinique, Betty G, MD  naloxone [facility]) nasal spray 4 mg/0.1 mL 1 spray in each nostril x 1 if opioid overdose. 06/08/20   Martinique, Betty G, MD  ondansetron (ZOFRAN ODT) 4 MG disintegrating tablet Place 1 tablet under the tongue every 12 hours PRN nausea 04/03/21   Martinique, Betty G, MD  oxyCODONE (OXY IR/ROXICODONE) 5 MG immediate release tablet Take 1 tablet (5 mg total) by mouth daily as needed for up to 20 days for moderate pain or severe pain. 05/08/21 05/28/21  Martinique, Betty G, MD  pantoprazole (PROTONIX) 40 MG tablet Take 1 tablet (40 mg total) by mouth daily. Patient taking differently: Take 40 mg by mouth daily as needed (heart burn). 12/25/20   Lajean Saver, MD  potassium chloride SA (KLOR-CON M) 20 MEQ tablet Take 1 tablet (20 mEq total) by mouth daily. 05/08/21   Martinique, Betty G, MD  promethazine (PHENERGAN) 25 MG suppository Place 1 suppository (25 mg total) rectally every 6 (six) hours as needed for nausea or vomiting. 91/69/45   Delora Fuel, MD  promethazine (PHENERGAN) 25 MG tablet Take 1 tablet (25 mg total) by mouth every 6 (six) hours as needed. 03/88/82   Delora Fuel,  MD    Allergies    Cortisone, Eggs or egg-derived products, Ketorolac, Ketorolac tromethamine, Haldol [haloperidol lactate], Haloperidol, Hydrocortisone, Iodinated diagnostic agents, Ketorolac tromethamine, Reglan [metoclopramide], and Compazine [prochlorperazine]  Review of Systems   Review of Systems  Constitutional:  Negative for chills and fever.  HENT:  Negative for congestion and sore throat.   Eyes:  Negative for visual disturbance.  Respiratory:  Negative for cough and shortness of breath.   Cardiovascular:  Negative for chest pain.  Gastrointestinal:  Positive for abdominal pain and nausea. Negative for abdominal distention, anal bleeding, blood in stool, constipation, diarrhea, rectal pain and vomiting.  Genitourinary:  Negative for difficulty urinating, dysuria, flank pain, frequency, genital sores, hematuria, penile discharge, penile pain, penile swelling, scrotal swelling, testicular pain and urgency.  Musculoskeletal:  Negative for back pain and neck pain.  Skin:  Negative for color change and rash.  Neurological:  Negative for dizziness, syncope, light-headedness and headaches.  Psychiatric/Behavioral:  Negative for confusion.    Physical Exam Updated Vital Signs BP (!) 164/113 (BP Location: Right Arm)    Pulse 93    Temp 98.2 F (36.8 C) (Oral)    Resp 18    SpO2 97%   Physical Exam Vitals and nursing note reviewed.  Constitutional:      General: He is not in acute distress.    Appearance: He is not ill-appearing, toxic-appearing or diaphoretic.  HENT:     Head: Normocephalic.  Eyes:     General: No scleral icterus.       Right eye: No discharge.        Left eye: No discharge.  Cardiovascular:     Rate and Rhythm: Normal rate.  Pulmonary:     Effort: Pulmonary effort is normal.  Abdominal:     General: Abdomen is flat. Bowel sounds are normal. There is no distension. There are no signs of injury.     Palpations: Abdomen is soft. There is no mass or pulsatile  mass.     Tenderness: There is abdominal tenderness in the epigastric area and left upper quadrant. There is no right CVA tenderness, left CVA tenderness, guarding or rebound.     Hernia: There is no hernia in the umbilical area or ventral area.  Skin:    General: Skin is warm and dry.  Neurological:     General: No focal deficit present.     Mental Status: He is alert.  Psychiatric:        Behavior: Behavior is cooperative.    ED Results / Procedures / Treatments   Labs (all labs ordered are listed, but only abnormal results are displayed) Labs Reviewed  COMPREHENSIVE METABOLIC PANEL - Abnormal; Notable for the following components:      Result Value   Potassium 3.3 (*)    Glucose, Bld 144 (*)    All other components within normal limits  CBC - Abnormal; Notable for the following components:   WBC 12.6 (*)    All other components within normal limits  URINALYSIS, ROUTINE W REFLEX MICROSCOPIC - Abnormal; Notable for the following components:   Protein, ur 30 (*)    All other components within normal limits  LIPASE, BLOOD  MAGNESIUM    EKG None  Radiology No results found.  Procedures Procedures   Medications Ordered in ED Medications  morphine 4 MG/ML injection 4 mg (4 mg Intravenous Given 05/16/21 1255)  lactated ringers bolus 1,000 mL (0 mLs Intravenous Stopped 05/16/21 1409)  pantoprazole (PROTONIX) injection 40 mg (40 mg Intravenous Given 05/16/21 1315)  morphine 4 MG/ML injection 4 mg (4 mg Intravenous Given 05/16/21 1408)  alum & mag hydroxide-simeth (MAALOX/MYLANTA) 200-200-20 MG/5ML suspension 30 mL (30 mLs Oral Given 05/16/21 1519)    And  lidocaine (XYLOCAINE) 2 % viscous mouth solution 15 mL (15 mLs Oral Given 05/16/21 1519)  potassium chloride SA (KLOR-CON M) CR tablet 40 mEq (40 mEq Oral Given 05/16/21 1519)    ED Course  I have reviewed the triage vital signs and the nursing notes.  Pertinent labs & imaging results that were available during my  care of the patient were reviewed by me and considered in my medical decision making (see chart for details).    MDM Rules/Calculators/A&P  Alert 45 year old male no acute distress, nontoxic-appearing.  Presents to ED with chief complaint of epigastric and left upper quadrant pain.  Patient has history of chronic pancreatitis and chronic abdominal pain.  Per chart review patient has been seen in the emergency department 4 times in the last 3 weeks with similar complaints of abdominal pain.  Patient had CT imaging of abdomen pelvis on 12/8 which showed no acute findings in the abdomen or pelvis.  CT shows slight leukocytosis at 12.6 lab work was similarly elevated 4 days prior. CMP shows slight hypokalemia with potassium at 3.3.  Will check magnesium and EKG. AST, ALT, alk phos, total bili all within normal limits, low suspicion for hepatobiliary disease at this time. Lipase within normal limits, low suspicion for acute pancreatitis at this time.  EKG shows that QTC is within normal limits.  We will give patient Phenergan, morphine, lactated Ringer's, and Protonix.  Plan to reassess patient after he receives medication.  Plan to give patient p.o. potassium once he is tolerating oral intake.  Patient has resolution of nausea after receiving Phenergan.  Able to handle p.o. intake without difficulty.  Patient has no episodes of vomiting while in the emergency department.  Patient has improvement in pain after receiving pain management.  Pain has not completely resolved.  Abdomen remains soft, nondistended, improvement in tenderness.  Patient states that he has current prescription for potassium from his PCP.  Patient reports that he has current prescription of Protonix but is not taking it.  Patient advised to continue taking prescribed potassium and to follow-up with PCP for reevaluation of lab work.  Patient advised to start taking his Protonix once daily as prescribed.   Patient advised to follow-up with his gastroenterologist.  Discussed results, findings, treatment and follow up. Patient advised of return precautions. Patient verbalized understanding and agreed with plan.      Final Clinical Impression(s) / ED Diagnoses Final diagnoses:  Epigastric pain    Rx / DC Orders ED Discharge Orders     None        Dyann Ruddle 05/16/21 2126    Godfrey Pick, MD 05/17/21 2159

## 2021-05-16 NOTE — Discharge Instructions (Signed)
[facility] to be evaluated for your nausea and abdominal pain.  Your lab work and physical exam were reassuring.  You had some improvement in your symptoms after receiving medication.  You were able to tolerate p.o. intake.  Please take your prescribed pain medication as needed.  Please take your prescribed potassium daily and follow-up with your primary care doctor to have your potassium level rechecked.  Please take your prescribed Protonix medication.  Patient importantly follow-up with your gastroenterologist due to multiple episodes of your chronic abdominal pain.  Get help right away if: Your pain does not go away as soon as your health care provider told you to expect. You cannot stop vomiting. Your pain is only in areas of the abdomen, such as the right side or the left lower portion of the abdomen. Pain on the right side could be caused by appendicitis. You have bloody or black stools, or stools that look like tar. You have severe pain, cramping, or bloating in your abdomen. You have signs of dehydration, such as: Dark urine, very little urine, or no urine. Cracked lips. Dry mouth. Sunken eyes. Sleepiness. Weakness. You have trouble breathing or chest pain.

## 2021-05-23 ENCOUNTER — Other Ambulatory Visit: Payer: Self-pay

## 2021-05-23 MED ORDER — ROSUVASTATIN CALCIUM 10 MG PO TABS: 10.0000 mg | ORAL_TABLET | Freq: Every day | ORAL | 3 refills | Status: DC

## 2021-05-24 ENCOUNTER — Telehealth: Payer: Self-pay | Admitting: Family Medicine

## 2021-05-24 NOTE — Telephone Encounter (Signed)
Patient calling in with respiratory symptoms: Shortness of breath, chest pain, palpitations or other red words send to Triage  Does the patient have a fever over 100, cough, congestion, sore throat, runny nose, lost of taste/smell (please list symptoms that patient has)?sinus congestion, runny nose  What date did symptoms start?05-17-2021- (If over 5 days ago, pt may be scheduled for in person visit)  Have you tested for Covid in the last 5 days? Yes   If yes, was it positive []  OR negative [] ? If positive in the last 5 days, please schedule virtual visit now. If negative, schedule for an in person OV with the next available provider if PCP has no openings. Please also let patient know they will be tested again (follow the script below)  "you will have to arrive 77mins prior to your appt time to be Covid tested. Please park in back of office at the [facility] & call [phone] to let the staff know you have arrived. A staff member will meet you at your car to do a rapid covid test. Once the test has resulted you will be notified by phone of your results to determine if appt will remain an in person visit or be converted to a virtual/phone visit. If you arrive less than 27mins before your appt time, your visit will be automatically converted to virtual & any recommended testing will happen AFTER the visit." Pt has office visit on 05-26-2021  THINGS TO REMEMBER  If no availability for virtual visit in office,  please schedule another [place] office  If no availability at another [place] office, please instruct patient that they can schedule an evisit or virtual visit through their mychart account. Visits up to 8pm  patients can be seen in office 5 days after positive COVID test

## 2021-05-25 NOTE — Progress Notes (Deleted)
ACUTE VISIT No chief complaint on file.  HPI: [name] is a 45 y.o. male, who is here today complaining of *** HPI  Review of Systems Rest see pertinent positives and negatives per HPI.  Current Outpatient Medications on File Prior to Visit  Medication Sig Dispense Refill   amLODipine (NORVASC) 5 MG tablet Take 1 tablet (5 mg total) by mouth daily. 90 tablet 1   benazepril (LOTENSIN) 40 MG tablet Take 1 tablet (40 mg total) by mouth daily. 90 tablet 1   fluticasone (FLONASE) 50 MCG/ACT nasal spray Place 1 spray into both nostrils 2 (two) times daily. (Patient not taking: Reported on 05/16/2021) 16 g 2   metoprolol succinate (TOPROL-XL) 100 MG 24 hr tablet TAKE 1 TABLET BY MOUTH DAILY. TAKE WITH OR IMMEDIATELY FOLLOWING A MEAL. 90 tablet 1   naloxone (NARCAN) nasal spray 4 mg/0.1 mL 1 spray in each nostril x 1 if opioid overdose. 1 each 1   ondansetron (ZOFRAN ODT) 4 MG disintegrating tablet Place 1 tablet under the tongue every 12 hours PRN nausea 20 tablet 0   oxyCODONE (OXY IR/ROXICODONE) 5 MG immediate release tablet Take 1 tablet (5 mg total) by mouth daily as needed for up to 20 days for moderate pain or severe pain. 20 tablet 0   pantoprazole (PROTONIX) 40 MG tablet Take 1 tablet (40 mg total) by mouth daily. (Patient taking differently: Take 40 mg by mouth daily as needed (heart burn).) 30 tablet 0   potassium chloride SA (KLOR-CON M) 20 MEQ tablet Take 1 tablet (20 mEq total) by mouth daily. 30 tablet 0   promethazine (PHENERGAN) 25 MG suppository Place 1 suppository (25 mg total) rectally every 6 (six) hours as needed for nausea or vomiting. 12 each 0   promethazine (PHENERGAN) 25 MG tablet Take 1 tablet (25 mg total) by mouth every 6 (six) hours as needed. 45 tablet 1   rosuvastatin (CRESTOR) 10 MG tablet Take 1 tablet (10 mg total) by mouth daily. 30 tablet 3   No current facility-administered medications on file prior to visit.     Past Medical History:   Diagnosis Date   Chronic abdominal pain    Diverticulitis    Drug-seeking behavior    Hypertension    Kidney stones    Pancreatitis, chronic ([place]) 05/28/2005   Allergies  Allergen Reactions   Cortisone Other (See Comments)    drops potassium level "bottoms out" potassium level drops potassium level Bottoms out potassium  "bottoms out" potassium level Other reaction(s): Other (See Comments) Bottoms out potassium   Eggs Or Egg-Derived Products Hives and Other (See Comments)    Rash  Rash   Ketorolac Hives and Other (See Comments)    Hives, slightly labored breathing  Hives, slightly labored breathing   Ketorolac Tromethamine Hives and Shortness Of Breath   Haldol [Haloperidol Lactate] Other (See Comments)    "jittery"    Haloperidol Other (See Comments)    "jittery" "jittery"    Hydrocortisone Hives    Also drops potassium level   Iodinated Contrast Media Nausea And Vomiting and Other (See Comments)    Hives Hives Hives Hives   Ketorolac Tromethamine Hives   Reglan [Metoclopramide]     Face "draws" and gets figgety   Compazine [Prochlorperazine] Other (See Comments)    Dystonic reaction    Social History   Socioeconomic History   Marital status: Married    Spouse name: Not on file   Number of children: 1  Years of education: Not on file   Highest education level: Some college, no degree  Occupational History   Not on file  Tobacco Use   Smoking status: Every Day    Packs/day: 0.50    Types: Cigarettes   Smokeless tobacco: Never  Vaping Use   Vaping Use: Never used  Substance and Sexual Activity   Alcohol use: Not Currently    Comment: one beer tonight   Drug use: No   Sexual activity: Not on file  Other Topics Concern   Not on file  Social History Narrative   Not on file   Social Determinants of Health   Financial Resource Strain: Low Risk    Difficulty of Paying Living Expenses: Not hard at all  Food Insecurity: No Food Insecurity    Worried About Charity fundraiser in the Last Year: Never true   Ran Out of Food in the Last Year: Never true  Transportation Needs: No Transportation Needs   Lack of Transportation (Medical): No   Lack of Transportation (Non-Medical): No  Physical Activity: Insufficiently Active   Days of Exercise per Week: 1 day   Minutes of Exercise per Session: 20 min  Stress: No Stress Concern Present   Feeling of Stress : Only a little  Social Connections: Engineer, building services of Communication with Friends and Family: Three times a week   Frequency of Social Gatherings with Friends and Family: Once a week   Attends Religious Services: More than 4 times per year   Active Member of [employer] or Organizations: Yes   Attends Music therapist: More than 4 times per year   Marital Status: Married    There were no vitals filed for this visit. There is no height or weight on file to calculate BMI.  Physical Exam  ASSESSMENT AND PLAN:  There are no diagnoses linked to this encounter.   No follow-ups on file.   Betty G. Martinique, MD  [facility]. [place] office.  Discharge Instructions   None

## 2021-05-26 ENCOUNTER — Telehealth (INDEPENDENT_AMBULATORY_CARE_PROVIDER_SITE_OTHER): Payer: BLUE CROSS/BLUE SHIELD | Admitting: Family Medicine

## 2021-05-26 ENCOUNTER — Encounter: Payer: Self-pay | Admitting: Family Medicine

## 2021-05-26 VITALS — Ht 73.0 in

## 2021-05-26 DIAGNOSIS — J3489 Other specified disorders of nose and nasal sinuses: Secondary | ICD-10-CM

## 2021-05-26 DIAGNOSIS — G894 Chronic pain syndrome: Secondary | ICD-10-CM | POA: Diagnosis not present

## 2021-05-26 DIAGNOSIS — J309 Allergic rhinitis, unspecified: Secondary | ICD-10-CM | POA: Diagnosis not present

## 2021-05-26 MED ORDER — OXYCODONE HCL 5 MG PO TABS: 5.0000 mg | ORAL_TABLET | Freq: Two times a day (BID) | ORAL | 0 refills | Status: DC | PRN

## 2021-05-26 MED ORDER — AMOXICILLIN-POT CLAVULANATE 875-125 MG PO TABS: 1.0000 | ORAL_TABLET | Freq: Two times a day (BID) | ORAL | 0 refills | Status: AC

## 2021-05-26 MED ORDER — FLUTICASONE PROPIONATE 50 MCG/ACT NA SUSP: 1.0000 | Freq: Two times a day (BID) | NASAL | 2 refills | Status: DC

## 2021-05-26 NOTE — Progress Notes (Signed)
Virtual Visit via Video Note I connected with [name] on 05/26/21 by a video enabled telemedicine application and verified that I am speaking with the correct person using two identifiers.  Location patient: home Location provider:work office Persons participating in the virtual visit: patient, provider  I discussed the limitations of evaluation and management by telemedicine and the availability of in person appointments. The patient expressed understanding and agreed to proceed.  Chief Complaint  Patient presents with   Sinus Problem    X a week   HPI: [name] is a 45 yo male with hx of chronic pain,tobacco use disorder, HTN,and HLD c/o a week of nasal congestion,postnasal drainage, "little" frontal pressure headache,and facial pain. Nose bleed x 2. Mostly non productive cough. Ears feel "stopped up", no earache or ear discharge.  Negative for fever,chills,sore throat, SOB,wheezing,body aches,or skin rash. No sick contact or recent travel.  No changes in chronic abdominal pain,nausea,or vomiting. He has taken OTC clod medications and nasal afrin.  Chronic pain, he is now on Oxycodone 5 mg daily as needed. He is requesting refills, states that he has taken med more often because his wife recently had knee surgery and did not want to go to the ER. Medication actually helps with pain. A few months ago I recommended to established with pain management, did not seem like regimen he was on was helping, he felt like IR Oxycodone needed to be increased and still having ER visits due to pain.  He has an appt for pain management in 05/2021 but he is afraid provider may not been covered under new health insurance, [employer]. Since his last visit, 05/08/21, he has been in the ED x 2.  ROS: See pertinent positives and negatives per HPI.  Past Medical History:  Diagnosis Date   Chronic abdominal pain    Diverticulitis    Drug-seeking behavior    Hypertension    Kidney stones     Pancreatitis, chronic ([place]) 05/28/2005   Past Surgical History:  Procedure Laterality Date   APPENDECTOMY     CHOLECYSTECTOMY     ERCP N/A 12/30/2019   Procedure: ENDOSCOPIC RETROGRADE CHOLANGIOPANCREATOGRAPHY (ERCP);  Surgeon: Milus Banister, MD;  Location: [facility];  Service: Endoscopy;  Laterality: N/A;   ERCP N/A 03/04/2021   Procedure: ENDOSCOPIC RETROGRADE CHOLANGIOPANCREATOGRAPHY (ERCP);  Surgeon: Carol Ada, MD;  Location: [facility];  Service: Endoscopy;  Laterality: N/A;   ERCP W/ METAL STENT PLACEMENT     KIDNEY STONE SURGERY     REMOVAL OF STONES  12/30/2019   Procedure: REMOVAL OF STONES;  Surgeon: Milus Banister, MD;  Location: [facility];  Service: Endoscopy;;   REMOVAL OF STONES  03/04/2021   Procedure: REMOVAL OF STONES;  Surgeon: Carol Ada, MD;  Location: [facility];  Service: Endoscopy;;   SPHINCTEROTOMY  03/04/2021   Procedure: Joan Mayans;  Surgeon: Carol Ada, MD;  Location: [facility];  Service: Endoscopy;;    Family History  Problem Relation Age of Onset   Breast cancer Mother    Early death Mother    Hypertension Mother    Heart failure Father    Hypertension Father    Heart disease Father    Hyperlipidemia Father    Pancreatic cancer Paternal Grandmother        possibly   Pancreatic cancer Paternal Aunt    Colon cancer Neg Hx     Social History   Socioeconomic History   Marital status: Married    Spouse name: Not on  file   Number of children: 1   Years of education: Not on file   Highest education level: Some college, no degree  Occupational History   Not on file  Tobacco Use   Smoking status: Every Day    Packs/day: 0.50    Types: Cigarettes   Smokeless tobacco: Never  Vaping Use   Vaping Use: Never used  Substance and Sexual Activity   Alcohol use: Not Currently    Comment: one beer tonight   Drug use: No   Sexual activity: Not on file  Other Topics Concern   Not on file  Social History Narrative   Not on  file   Social Determinants of Health   Financial Resource Strain: Low Risk    Difficulty of Paying Living Expenses: Not hard at all  Food Insecurity: No Food Insecurity   Worried About Charity fundraiser in the Last Year: Never true   Ran Out of Food in the Last Year: Never true  Transportation Needs: No Transportation Needs   Lack of Transportation (Medical): No   Lack of Transportation (Non-Medical): No  Physical Activity: Insufficiently Active   Days of Exercise per Week: 1 day   Minutes of Exercise per Session: 20 min  Stress: No Stress Concern Present   Feeling of Stress : Only a little  Social Connections: Engineer, building services of Communication with Friends and Family: Three times a week   Frequency of Social Gatherings with Friends and Family: Once a week   Attends Religious Services: More than 4 times per year   Active Member of [employer] or Organizations: Yes   Attends Music therapist: More than 4 times per year   Marital Status: Married  Human resources officer Violence: Not on file    Current Outpatient Medications:    amLODipine (NORVASC) 5 MG tablet, Take 1 tablet (5 mg total) by mouth daily., Disp: 90 tablet, Rfl: 1   benazepril (LOTENSIN) 40 MG tablet, Take 1 tablet (40 mg total) by mouth daily., Disp: 90 tablet, Rfl: 1   fluticasone (FLONASE) 50 MCG/ACT nasal spray, Place 1 spray into both nostrils 2 (two) times daily., Disp: 16 g, Rfl: 2   metoprolol succinate (TOPROL-XL) 100 MG 24 hr tablet, TAKE 1 TABLET BY MOUTH DAILY. TAKE WITH OR IMMEDIATELY FOLLOWING A MEAL., Disp: 90 tablet, Rfl: 1   naloxone (NARCAN) nasal spray 4 mg/0.1 mL, 1 spray in each nostril x 1 if opioid overdose., Disp: 1 each, Rfl: 1   ondansetron (ZOFRAN ODT) 4 MG disintegrating tablet, Place 1 tablet under the tongue every 12 hours PRN nausea, Disp: 20 tablet, Rfl: 0   oxyCODONE (OXY IR/ROXICODONE) 5 MG immediate release tablet, Take 1 tablet (5 mg total) by mouth daily as needed  for up to 20 days for moderate pain or severe pain., Disp: 20 tablet, Rfl: 0   pantoprazole (PROTONIX) 40 MG tablet, Take 1 tablet (40 mg total) by mouth daily. (Patient taking differently: Take 40 mg by mouth daily as needed (heart burn).), Disp: 30 tablet, Rfl: 0   potassium chloride SA (KLOR-CON M) 20 MEQ tablet, Take 1 tablet (20 mEq total) by mouth daily., Disp: 30 tablet, Rfl: 0   promethazine (PHENERGAN) 25 MG suppository, Place 1 suppository (25 mg total) rectally every 6 (six) hours as needed for nausea or vomiting., Disp: 12 each, Rfl: 0   promethazine (PHENERGAN) 25 MG tablet, Take 1 tablet (25 mg total) by mouth every 6 (six) hours as needed.,  Disp: 45 tablet, Rfl: 1   rosuvastatin (CRESTOR) 10 MG tablet, Take 1 tablet (10 mg total) by mouth daily., Disp: 30 tablet, Rfl: 3  EXAM:  VITALS per patient if applicable:Ht 6\' 1"  (1.854 m)    BMI 26.39 kg/m   GENERAL: alert, oriented, appears well and in no acute distress  HEENT: atraumatic, conjunctiva clear, no obvious abnormalities on inspection of external nose and ears No maxillary or frontal sinus pain with pressing areas.  NECK: normal movements of the head and neck  LUNGS: on inspection no signs of respiratory distress, breathing rate appears normal, no obvious gross SOB, gasping or wheezing  CV: no obvious cyanosis  MS: moves all visible extremities without noticeable abnormality  PSYCH/NEURO: pleasant and cooperative, no obvious depression or anxiety, speech and thought processing grossly intact  ASSESSMENT AND PLAN:  Discussed the following assessment and plan:  Pain of maxillary sinus - Plan: amoxicillin-clavulanate (AUGMENTIN) 875-125 MG tablet At this time I do not think there is an ongoing bacterial infectious process, recommend holding on antibiotic for 3 days and start it if he is not feeling any better or if symptoms get worse.  Allergic rhinitis, unspecified seasonality, unspecified trigger - Plan: fluticasone  (FLONASE) 50 MCG/ACT nasal spray This could be causing above symptoms. Recommend Flonase nasal spray daily for a few days and nasal saline irrigations as needed. Caution with using afrin, some side effects discussed.  Chronic pain disorder - Plan: oxyCODONE (OXY IR/ROXICODONE) 5 MG immediate release tablet He is going to keep appt with pain management. Recommend calling his new health insurance to find out about providers under his new network. Today we agreed on continuing Oxycodone 5 mg bid prn until he sees provider, at least until 06/26/21. Instructed to take medication as prescribed. [place] controlled subs report reviewed.  We discussed guidelines for opioid use for chronic pain management, he understands he may have a urine tox done as part of initial evaluation. He is not longer under med contract.  We discussed possible serious and likely etiologies, options for evaluation and workup, limitations of telemedicine visit vs in person visit, treatment, treatment risks and precautions. The patient was advised to call back or seek an in-person evaluation if the symptoms worsen or if the condition fails to improve as anticipated. I discussed the assessment and treatment plan with the patient. Mr. Barco was provided an opportunity to ask questions and all were answered. He agreed with the plan and demonstrated an understanding of the instructions.  Return if symptoms worsen or fail to improve, for Keep next appt..  Shemia Bevel G. Martinique, MD  [facility]. [place] office.

## 2021-05-30 ENCOUNTER — Other Ambulatory Visit: Payer: Self-pay | Admitting: Family Medicine

## 2021-05-30 DIAGNOSIS — E876 Hypokalemia: Secondary | ICD-10-CM

## 2021-06-01 ENCOUNTER — Other Ambulatory Visit: Payer: Self-pay

## 2021-06-01 ENCOUNTER — Encounter (HOSPITAL_BASED_OUTPATIENT_CLINIC_OR_DEPARTMENT_OTHER): Payer: Self-pay

## 2021-06-01 ENCOUNTER — Emergency Department (HOSPITAL_BASED_OUTPATIENT_CLINIC_OR_DEPARTMENT_OTHER): Payer: No Typology Code available for payment source

## 2021-06-01 ENCOUNTER — Emergency Department (HOSPITAL_BASED_OUTPATIENT_CLINIC_OR_DEPARTMENT_OTHER)
Admission: EM | Admit: 2021-06-01 | Discharge: 2021-06-01 | Disposition: A | Discharge status: Home / Self Care | Payer: No Typology Code available for payment source | Attending: Emergency Medicine | Admitting: Emergency Medicine

## 2021-06-01 DIAGNOSIS — D72829 Elevated white blood cell count, unspecified: Secondary | ICD-10-CM | POA: Diagnosis not present

## 2021-06-01 DIAGNOSIS — K529 Noninfective gastroenteritis and colitis, unspecified: Secondary | ICD-10-CM | POA: Insufficient documentation

## 2021-06-01 DIAGNOSIS — R1012 Left upper quadrant pain: Secondary | ICD-10-CM | POA: Diagnosis present

## 2021-06-01 LAB — COMPREHENSIVE METABOLIC PANEL
ALT: 27 U/L (ref 0–44)
AST: 24 U/L (ref 15–41)
Albumin: 4.4 g/dL (ref 3.5–5.0)
Alkaline Phosphatase: 88 U/L (ref 38–126)
Anion gap: 13 (ref 5–15)
BUN: 13 mg/dL (ref 6–20)
CO2: 20 mmol/L — ABNORMAL LOW (ref 22–32)
Calcium: 9.4 mg/dL (ref 8.9–10.3)
Chloride: 106 mmol/L (ref 98–111)
Creatinine, Ser: 0.77 mg/dL (ref 0.61–1.24)
GFR, Estimated: >60 mL/min (ref >60)
Glucose, Bld: 118 mg/dL — ABNORMAL HIGH (ref 70–99)
Potassium: 3.4 mmol/L — ABNORMAL LOW (ref 3.5–5.1)
Sodium: 139 mmol/L (ref 135–145)
Total Bilirubin: 0.6 mg/dL (ref 0.3–1.2)
Total Protein: 7.7 g/dL (ref 6.5–8.1)

## 2021-06-01 LAB — URINALYSIS, ROUTINE W REFLEX MICROSCOPIC
Bilirubin Urine: NEGATIVE
Glucose, UA: NEGATIVE mg/dL
Hgb urine dipstick: NEGATIVE
Ketones, ur: NEGATIVE mg/dL
Leukocytes,Ua: NEGATIVE
Nitrite: NEGATIVE
Protein, ur: NEGATIVE mg/dL
Specific Gravity, Urine: 1.015 (ref 1.005–1.030)
pH: 7 (ref 5.0–8.0)

## 2021-06-01 LAB — CBC
HCT: 44.3 % (ref 39.0–52.0)
Hemoglobin: 15.8 g/dL (ref 13.0–17.0)
MCH: 30 pg (ref 26.0–34.0)
MCHC: 35.7 g/dL (ref 30.0–36.0)
MCV: 84.1 fL (ref 80.0–100.0)
Platelets: 256 10*3/uL (ref 150–400)
RBC: 5.27 MIL/uL (ref 4.22–5.81)
RDW: 13 % (ref 11.5–15.5)
WBC: 16.1 10*3/uL — ABNORMAL HIGH (ref 4.0–10.5)
nRBC: 0 % (ref 0.0–0.2)

## 2021-06-01 LAB — LIPASE, BLOOD: Lipase: 39 U/L (ref 11–51)

## 2021-06-01 MED ORDER — ONDANSETRON HCL 4 MG/2ML IJ SOLN
4.0000 mg | Freq: Once | INTRAMUSCULAR | Status: AC
  Administered 2021-06-01: 4 mg via INTRAVENOUS
  Filled 2021-06-01: qty 2

## 2021-06-01 MED ORDER — LACTATED RINGERS IV BOLUS
500.0000 mL | Freq: Once | INTRAVENOUS | Status: AC
  Administered 2021-06-01: 500 mL via INTRAVENOUS

## 2021-06-01 MED ORDER — SUCRALFATE 1 G PO TABS: 1.0000 g | ORAL_TABLET | Freq: Three times a day (TID) | ORAL | 0 refills | Status: DC

## 2021-06-01 MED ORDER — HYDROMORPHONE HCL 1 MG/ML IJ SOLN
1.0000 mg | Freq: Once | INTRAMUSCULAR | Status: AC
  Administered 2021-06-01: 1 mg via INTRAVENOUS
  Filled 2021-06-01: qty 1

## 2021-06-01 NOTE — Discharge Instructions (Addendum)
Today you were evaluated for severe abdominal pain with nausea and vomiting.  We were able to rule out diverticulitis, acute pancreatitis, and kidney stones as possible causes of your abdominal pain.  At this time, I believe that you may be experiencing a bout of viral illness.  I recommend that you continue with your current bowel regimen including Prilosec, Zofran, Protonix; I will also send in Carafate for you to take at meals and bedtime.  Please return for reevaluation if you develop fever, severe/worsening abdominal pain, inability to tolerate food or fluids, or continue to feel progressively worse.

## 2021-06-01 NOTE — ED Notes (Signed)
Pump required manually programming 2nd 547ml bolus.  Pt. Received total of 1020ml today of LR.

## 2021-06-01 NOTE — ED Triage Notes (Signed)
Pt c/o left upper abdominal pain, nausea/vomiting since Tuesday. Hx of chronic abdominal pain and pancreatitis. Seen at [facility] on 12/20 with similar symptoms however states the pain is much worse. Unable to tolerate PO.

## 2021-06-01 NOTE — ED Provider Notes (Signed)
[facility] Provider Note   CSN: [id number] Arrival date & time: 06/01/21  0818     History  Chief Complaint  Patient presents with   Abdominal Pain    [name] is a 46 y.o. male with past medical history of diverticulosis, kidney stones, atherosclerosis, appendectomy, cholecystectomy, chronic pancreatitis who presents with a 2-day exacerbation of severe abdominal pain.  Patient states that he has been evaluated several times for abdominal pain, however over the last 2 days he has had a significant increase in his pain level.  He is unable to tolerate p.o. intake at this time.  He is still having bowel movements and passing gas.  Nausea and vomiting present, vomitus consist of content he attempted to eat or drink plus bile.  He denies bloody stools or hematochezia.  He denies urinary symptoms at this time.  No fever or chills, however diaphoresis associated with vomiting.  He does say that at times the pain is worse when he tries to eat.  The pain is sharp and stabbing in nature, over the left upper quadrant mainly with some left lower quadrant involvement as well as radiation to his back.  He denies any significant back pain now or at any point during this acute exacerbation.    Home Medications Prior to Admission medications   Medication Sig Start Date End Date Taking? Authorizing Provider  amLODipine (NORVASC) 5 MG tablet Take 1 tablet (5 mg total) by mouth daily. 04/29/21   Martinique, Betty G, MD  amoxicillin-clavulanate (AUGMENTIN) 875-125 MG tablet Take 1 tablet by mouth 2 (two) times daily for 7 days. 05/26/21 06/02/21  Martinique, Betty G, MD  benazepril (LOTENSIN) 40 MG tablet Take 1 tablet (40 mg total) by mouth daily. 04/29/21   Martinique, Betty G, MD  fluticasone (FLONASE) 50 MCG/ACT nasal spray Place 1 spray into both nostrils 2 (two) times daily. 05/26/21   Martinique, Betty G, MD  KLOR-CON M20 20 MEQ tablet TAKE 1 TABLET BY MOUTH EVERY DAY 05/30/21   Martinique,  Betty G, MD  metoprolol succinate (TOPROL-XL) 100 MG 24 hr tablet TAKE 1 TABLET BY MOUTH DAILY. TAKE WITH OR IMMEDIATELY FOLLOWING A MEAL. 05/01/21   Martinique, Betty G, MD  naloxone [facility]) nasal spray 4 mg/0.1 mL 1 spray in each nostril x 1 if opioid overdose. 06/08/20   Martinique, Betty G, MD  ondansetron (ZOFRAN ODT) 4 MG disintegrating tablet Place 1 tablet under the tongue every 12 hours PRN nausea 04/03/21   Martinique, Betty G, MD  oxyCODONE (OXY IR/ROXICODONE) 5 MG immediate release tablet Take 1 tablet (5 mg total) by mouth 2 (two) times daily as needed for up to 14 days for moderate pain or severe pain. 05/26/21 06/09/21  Martinique, Betty G, MD  pantoprazole (PROTONIX) 40 MG tablet Take 1 tablet (40 mg total) by mouth daily. Patient taking differently: Take 40 mg by mouth daily as needed (heart burn). 12/25/20   Lajean Saver, MD  promethazine (PHENERGAN) 25 MG suppository Place 1 suppository (25 mg total) rectally every 6 (six) hours as needed for nausea or vomiting. 40/98/11   Delora Fuel, MD  promethazine (PHENERGAN) 25 MG tablet Take 1 tablet (25 mg total) by mouth every 6 (six) hours as needed. 91/47/82   Delora Fuel, MD  rosuvastatin (CRESTOR) 10 MG tablet Take 1 tablet (10 mg total) by mouth daily. 05/23/21   Martinique, Betty G, MD  sucralfate (CARAFATE) 1 g tablet Take 1 tablet (1 g total) by mouth  4 (four) times daily -  with meals and at bedtime. 06/01/21 08/30/21 Yes Farrel Gordon, DO      Allergies    Cortisone, Eggs or egg-derived products, Ketorolac, Ketorolac tromethamine, Haldol [haloperidol lactate], Haloperidol, Hydrocortisone, Iodinated contrast media, Ketorolac tromethamine, Reglan [metoclopramide], and Compazine [prochlorperazine]    Review of Systems   Negative unless stated in HPI.  Physical Exam Updated Vital Signs BP (!) 149/111    Pulse (!) 101    Temp 98.5 F (36.9 C) (Oral)    Resp 16    Ht 6\' 1"  (1.854 m)    Wt 90 kg    SpO2 94%    BMI 26.18 kg/m  Constitutional: Pleasant  male, in obvious pain. Cardio: Tachycardic with regular rhythm.  No murmurs, rubs, gallops. Pulm: Normal work of breathing on room air.  Clear to auscultation bilaterally. Abdomen: Tender to palpation over left upper and lower quadrant.  Negative for fluid wave.  Abdomen is distended but soft. MSK: Negative for extremity edema.  Distal pulses present. Skin: Skin is warm and dry. Neuro: Alert and oriented x3.  No focal deficit noted. Psych: Normal mood and affect.  ED Results / Procedures / Treatments   Labs (all labs ordered are listed, but only abnormal results are displayed) Labs Reviewed  COMPREHENSIVE METABOLIC PANEL - Abnormal; Notable for the following components:      Result Value   Potassium 3.4 (*)    CO2 20 (*)    Glucose, Bld 118 (*)    All other components within normal limits  CBC - Abnormal; Notable for the following components:   WBC 16.1 (*)    All other components within normal limits  LIPASE, BLOOD  URINALYSIS, ROUTINE W REFLEX MICROSCOPIC    EKG EKG Interpretation  Date/Time:  Thursday June 01 2021 08:33:57 EST Ventricular Rate:  106 PR Interval:  153 QRS Duration: 97 QT Interval:  357 QTC Calculation: 475 R Axis:   32 Text Interpretation: Sinus tachycardia Probable left atrial enlargement Borderline T abnormalities, inferior leads Baseline wander in lead(s) V4 When compared to prior, faster rate. No STEMI Confirmed by Antony Blackbird [phone]) on 06/01/2021 9:16:16 AM  Radiology CT ABDOMEN PELVIS WO CONTRAST  Result Date: 06/01/2021 CLINICAL DATA:  Abdominal pain EXAM: CT ABDOMEN AND PELVIS WITHOUT CONTRAST TECHNIQUE: Multidetector CT imaging of the abdomen and pelvis was performed following the standard protocol without IV contrast. COMPARISON:  05/04/2021 FINDINGS: Lower chest: There are small linear densities in the lower lung fields, more so on the left side suggesting minimal subsegmental atelectasis. Hepatobiliary: Surgical clips are seen in gallbladder  fossa. There is air in the bile ducts, possibly due to previous sphincterotomy Pancreas: No focal abnormality is seen. Spleen: Unremarkable. Adrenals/Urinary Tract: Adrenals are unremarkable. There is no hydronephrosis. There are no renal or ureteral stones. Urinary bladder is unremarkable. Stomach/Bowel: Stomach is unremarkable. Small bowel loops are not dilated. Appendix is not seen. There is no pericecal inflammation. There is incomplete distention of colon. There is no significant focal bowel wall thickening. There is no pericolic stranding or fluid collection. Vascular/Lymphatic: Unremarkable. Reproductive: Coarse calcifications are seen in the prostate. Other: There is no ascites or pneumoperitoneum. Musculoskeletal: Unremarkable. IMPRESSION: No acute findings are seen in noncontrast CT of abdomen and pelvis. No significant interval changes are noted. Electronically Signed   By: Elmer Picker M.D.   On: 06/01/2021 10:01    Procedures None  Medications Ordered in ED Medications  ondansetron (ZOFRAN) injection 4 mg (4 mg Intravenous  Given 06/01/21 0953)  HYDROmorphone (DILAUDID) injection 1 mg (1 mg Intravenous Given 06/01/21 0953)  lactated ringers bolus 500 mL (0 mLs Intravenous Stopped 06/01/21 1027)  HYDROmorphone (DILAUDID) injection 1 mg (1 mg Intravenous Given 06/01/21 1143)  lactated ringers bolus 500 mL (500 mLs Intravenous New Bag/Given 06/01/21 1135)  ondansetron (ZOFRAN) injection 4 mg (4 mg Intravenous Given 06/01/21 1142)    ED Course/ Medical Decision Making/ A&P   Differential includes diverticulitis, kidney stones, mesenteric ischemia, acute on chronic pancreatitis, pancreatic pseudocyst.  Patient has history of chronic pancreatitis secondary to multiple ERCP procedures.  At this time his lipase is within normal limits.  He does have a leukocytosis of 16.1 but is afebrile.  Patient is undergone several CT abdomen/pelvis without contrast recently that have not been noted to have  abnormalities that would be causing his pain, however imaging without contrast may be less sensitive.  He denies urinary symptoms and back pain, however his left upper and lower quadrant pain does radiate to the left side of his back.  Patient has recently been diagnosed with atherosclerosis and initiated on statin therapy so there could be a component of ischemia at play though less likely.  CT abdomen/pelvis was unremarkable.  UA unremarkable for acute cystitis.  Testing ordered: UA CBC CMP Lipase  Imaging ordered: CT abdomen/pelvis without contrast  Patient was managed with Dilaudid, Zofran, IVF bolus.  Given negative work-up, I feel the patient is stable for discharge at this time with home symptomatic management.  I recommended that he follow-up with gastroenterology for recurrent severe abdominal pain.   Final Clinical Impression(s) / ED Diagnoses Final diagnoses:  Colitis    Rx / DC Orders ED Discharge Orders          Ordered    sucralfate (CARAFATE) 1 g tablet  3 times daily with meals & bedtime        06/01/21 Cameron, Reid, DO 06/01/21 1258    Tegeler, Gwenyth Allegra, MD 06/02/21 [phone]

## 2021-06-03 ENCOUNTER — Emergency Department (HOSPITAL_BASED_OUTPATIENT_CLINIC_OR_DEPARTMENT_OTHER)
Admission: EM | Admit: 2021-06-03 | Discharge: 2021-06-03 | Disposition: A | Discharge status: Home / Self Care | Payer: No Typology Code available for payment source | Attending: Emergency Medicine | Admitting: Emergency Medicine

## 2021-06-03 ENCOUNTER — Encounter (HOSPITAL_BASED_OUTPATIENT_CLINIC_OR_DEPARTMENT_OTHER): Payer: Self-pay | Admitting: *Deleted

## 2021-06-03 ENCOUNTER — Other Ambulatory Visit: Payer: Self-pay

## 2021-06-03 DIAGNOSIS — R1084 Generalized abdominal pain: Secondary | ICD-10-CM | POA: Diagnosis present

## 2021-06-03 DIAGNOSIS — Z79899 Other long term (current) drug therapy: Secondary | ICD-10-CM | POA: Insufficient documentation

## 2021-06-03 DIAGNOSIS — K861 Other chronic pancreatitis: Secondary | ICD-10-CM

## 2021-06-03 DIAGNOSIS — G8929 Other chronic pain: Secondary | ICD-10-CM

## 2021-06-03 DIAGNOSIS — R109 Unspecified abdominal pain: Secondary | ICD-10-CM

## 2021-06-03 LAB — CBC WITH DIFFERENTIAL/PLATELET
Abs Immature Granulocytes: 0.06 10*3/uL (ref 0.00–0.07)
Basophils Absolute: 0.1 10*3/uL (ref 0.0–0.1)
Basophils Relative: 1 %
Eosinophils Absolute: 0.3 10*3/uL (ref 0.0–0.5)
Eosinophils Relative: 3 %
HCT: 43.9 % (ref 39.0–52.0)
Hemoglobin: 15.4 g/dL (ref 13.0–17.0)
Immature Granulocytes: 1 %
Lymphocytes Relative: 21 %
Lymphs Abs: 2.6 10*3/uL (ref 0.7–4.0)
MCH: 29.6 pg (ref 26.0–34.0)
MCHC: 35.1 g/dL (ref 30.0–36.0)
MCV: 84.3 fL (ref 80.0–100.0)
Monocytes Absolute: 1 10*3/uL (ref 0.1–1.0)
Monocytes Relative: 8 %
Neutro Abs: 8.5 10*3/uL — ABNORMAL HIGH (ref 1.7–7.7)
Neutrophils Relative %: 66 %
Platelets: 248 10*3/uL (ref 150–400)
RBC: 5.21 MIL/uL (ref 4.22–5.81)
RDW: 12.9 % (ref 11.5–15.5)
WBC: 12.7 10*3/uL — ABNORMAL HIGH (ref 4.0–10.5)
nRBC: 0 % (ref 0.0–0.2)

## 2021-06-03 LAB — COMPREHENSIVE METABOLIC PANEL
ALT: 22 U/L (ref 0–44)
AST: 18 U/L (ref 15–41)
Albumin: 4.3 g/dL (ref 3.5–5.0)
Alkaline Phosphatase: 82 U/L (ref 38–126)
Anion gap: 10 (ref 5–15)
BUN: 11 mg/dL (ref 6–20)
CO2: 23 mmol/L (ref 22–32)
Calcium: 9.1 mg/dL (ref 8.9–10.3)
Chloride: 108 mmol/L (ref 98–111)
Creatinine, Ser: 0.81 mg/dL (ref 0.61–1.24)
GFR, Estimated: >60 mL/min (ref >60)
Glucose, Bld: 158 mg/dL — ABNORMAL HIGH (ref 70–99)
Potassium: 3.5 mmol/L (ref 3.5–5.1)
Sodium: 141 mmol/L (ref 135–145)
Total Bilirubin: 0.5 mg/dL (ref 0.3–1.2)
Total Protein: 7.1 g/dL (ref 6.5–8.1)

## 2021-06-03 LAB — URINALYSIS, ROUTINE W REFLEX MICROSCOPIC
Bilirubin Urine: NEGATIVE
Glucose, UA: NEGATIVE mg/dL
Hgb urine dipstick: NEGATIVE
Nitrite: NEGATIVE
Specific Gravity, Urine: 1.027 (ref 1.005–1.030)
pH: 5.5 (ref 5.0–8.0)

## 2021-06-03 LAB — LIPASE, BLOOD: Lipase: 30 U/L (ref 11–51)

## 2021-06-03 MED ORDER — LACTATED RINGERS IV BOLUS
1000.0000 mL | Freq: Once | INTRAVENOUS | Status: AC
  Administered 2021-06-03: 1000 mL via INTRAVENOUS

## 2021-06-03 MED ORDER — HYDROCODONE-ACETAMINOPHEN 5-325 MG PO TABS
1.0000 | ORAL_TABLET | Freq: Once | ORAL | Status: AC
  Administered 2021-06-03: 1 via ORAL
  Filled 2021-06-03: qty 1

## 2021-06-03 MED ORDER — HYDROMORPHONE HCL 1 MG/ML IJ SOLN
1.0000 mg | Freq: Once | INTRAMUSCULAR | Status: AC
  Administered 2021-06-03: 1 mg via INTRAVENOUS
  Filled 2021-06-03: qty 1

## 2021-06-03 MED ORDER — PROMETHAZINE HCL 25 MG/ML IJ SOLN
INTRAMUSCULAR | Status: AC
  Administered 2021-06-03: 12.5 mg via INTRAVENOUS
  Filled 2021-06-03: qty 1

## 2021-06-03 MED ORDER — ONDANSETRON HCL 4 MG/2ML IJ SOLN
4.0000 mg | Freq: Once | INTRAMUSCULAR | Status: AC
  Administered 2021-06-03: 4 mg via INTRAVENOUS
  Filled 2021-06-03: qty 2

## 2021-06-03 MED ORDER — SODIUM CHLORIDE 0.9 % IV SOLN
12.5000 mg | Freq: Four times a day (QID) | INTRAVENOUS | Status: DC | PRN
  Filled 2021-06-03: qty 0.5

## 2021-06-03 NOTE — ED Triage Notes (Addendum)
C/o LUQ abd pain, h/o pancreatitis, onset Tuesday, seen Thursday at [facility]. Also reports NV, and fatigue. Denies bleeding. Reports fever off and on. Verbalizes not better, and worse. Haven't been able to keep his phenergan or zofran down.

## 2021-06-03 NOTE — Discharge Instructions (Addendum)
You were seen in the emergency department today for abdominal pain.  As we discussed its incredibly important that you keep your surgery appointment.  It is important that you continue taking your prescribed nausea medicine at home, and this will help you keep down pain medicine.  You can also try suppositories, that will help with the symptoms too and I've attached some instructions about this.

## 2021-06-03 NOTE — ED Provider Notes (Signed)
[facility] EMERGENCY DEPT Provider Note   CSN: [id number] Arrival date & time: 06/03/21  1528     History  Chief Complaint  Patient presents with   Abdominal Pain    [name] is a 46 y.o. male with past medical history of diverticulosis, kidney stones, atherosclerosis, appendectomy, cholecystectomy, chronic pancreatitis secondary to complication from surgery related to ERCP with stent placement who presents to the emergency department complaining of acute exacerbation of severe abdominal pain.  He states that he was just seen on 1/5 in the emergency department for similar symptoms, and has plans to follow-up with the surgeon.  He states that since discharge from the ER, he is had a significant increase in his pain level and he is unable to tolerate p.o. intake again.  He still having bowel movements and passing gas.  He states that he is vomiting anything that he ingests.  He describes his pain as sharp and stabbing in nature, worse over his left upper quadrant and epigastric region, with some intermittent radiation to his back.  He states his pain feels like it always does, and he tried treating this at home, but could not keep any pain medicine down.   Abdominal Pain Associated symptoms: nausea and vomiting   Associated symptoms: no chest pain, no chills, no constipation, no diarrhea, no dysuria, no fever, no hematuria and no shortness of breath     Home Medications Prior to Admission medications   Medication Sig Start Date End Date Taking? Authorizing Provider  amLODipine (NORVASC) 5 MG tablet Take 1 tablet (5 mg total) by mouth daily. 04/29/21   Martin[place], Betty G, MD  benazepril (LOTENSIN) 40 MG tablet Take 1 tablet (40 mg total) by mouth daily. 04/29/21   [place], Betty G, MD  fluticasone (FLONASE) 50 MCG/ACT nasal spray Place 1 spray into both nostrils 2 (two) times daily. 05/26/21   [place], Betty G, MD  KLOR-CON M20 20 MEQ tablet TAKE 1 TABLET BY MOUTH EVERY DAY  05/30/21   [place], Betty G, MD  metoprolol succinate (TOPROL-XL) 100 MG 24 hr tablet TAKE 1 TABLET BY MOUTH DAILY. TAKE WITH OR IMMEDIATELY FOLLOWING A MEAL. 05/01/21   [place], Betty G, MD  naloxone [facility]) nasal spray 4 mg/0.1 mL 1 spray in each nostril x 1 if opioid overdose. 06/08/20   [place], Betty G, MD  ondansetron (ZOFRAN ODT) 4 MG disintegrating tablet Place 1 tablet under the tongue every 12 hours PRN nausea 04/03/21   [place], Betty G, MD  oxyCODONE (OXY IR/ROXICODONE) 5 MG immediate release tablet Take 1 tablet (5 mg total) by mouth 2 (two) times daily as needed for up to 14 days for moderate pain or severe pain. 05/26/21 06/09/21  [place], Betty G, MD  pantoprazole (PROTONIX) 40 MG tablet Take 1 tablet (40 mg total) by mouth daily. Patient taking differently: Take 40 mg by mouth daily as needed (heart burn). 12/25/20   Lajean Saver, MD  promethazine (PHENERGAN) 25 MG suppository Place 1 suppository (25 mg total) rectally every 6 (six) hours as needed for nausea or vomiting. 63/14/97   Delora Fuel, MD  promethazine (PHENERGAN) 25 MG tablet Take 1 tablet (25 mg total) by mouth every 6 (six) hours as needed. 02/63/78   Delora Fuel, MD  rosuvastatin (CRESTOR) 10 MG tablet Take 1 tablet (10 mg total) by mouth daily. 05/23/21   [place], Betty G, MD  sucralfate (CARAFATE) 1 g tablet Take 1 tablet (1 g total) by mouth 4 (four) times daily -  with meals and at bedtime. 06/01/21 08/30/21  Farrel Gordon, DO      Allergies    Cortisone, Eggs or egg-derived products, Ketorolac, Ketorolac tromethamine, Haldol [haloperidol lactate], Haloperidol, Hydrocortisone, Iodinated contrast media, Ketorolac tromethamine, Reglan [metoclopramide], and Compazine [prochlorperazine]    Review of Systems   Review of Systems  Constitutional:  Negative for chills and fever.  Respiratory:  Negative for shortness of breath.   Cardiovascular:  Negative for chest pain.  Gastrointestinal:  Positive for abdominal pain, nausea and  vomiting. Negative for constipation and diarrhea.  Genitourinary:  Negative for dysuria, flank pain, hematuria and urgency.  Musculoskeletal:  Positive for back pain.  All other systems reviewed and are negative.  Physical Exam Updated Vital Signs BP (!) 145/102    Pulse 83    Temp 98.4 F (36.9 C) (Oral)    Resp 15    Ht 6\' 1"  (1.854 m)    Wt 90.7 kg    SpO2 94%    BMI 26.39 kg/m  Physical Exam Vitals and nursing note reviewed.  Constitutional:      Appearance: Normal appearance.  HENT:     Head: Normocephalic and atraumatic.  Eyes:     Conjunctiva/sclera: Conjunctivae normal.  Cardiovascular:     Rate and Rhythm: Normal rate and regular rhythm.  Pulmonary:     Effort: Pulmonary effort is normal. No respiratory distress.     Breath sounds: Normal breath sounds.  Abdominal:     General: There is no distension.     Palpations: Abdomen is soft.     Tenderness: There is abdominal tenderness in the epigastric area and left upper quadrant. There is guarding. There is no right CVA tenderness, left CVA tenderness or rebound.  Skin:    General: Skin is warm and dry.  Neurological:     General: No focal deficit present.     Mental Status: He is alert.    ED Results / Procedures / Treatments   Labs (all labs ordered are listed, but only abnormal results are displayed) Labs Reviewed  COMPREHENSIVE METABOLIC PANEL - Abnormal; Notable for the following components:      Result Value   Glucose, Bld 158 (*)    All other components within normal limits  URINALYSIS, ROUTINE W REFLEX MICROSCOPIC - Abnormal; Notable for the following components:   Ketones, ur TRACE (*)    Protein, ur TRACE (*)    Leukocytes,Ua TRACE (*)    Bacteria, UA FEW (*)    All other components within normal limits  CBC WITH DIFFERENTIAL/PLATELET - Abnormal; Notable for the following components:   WBC 12.7 (*)    Neutro Abs 8.5 (*)    All other components within normal limits  LIPASE, BLOOD     EKG None  Radiology No results found.  Procedures Procedures    Medications Ordered in ED Medications  promethazine (PHENERGAN) 12.5 mg in sodium chloride 0.9 % 50 mL IVPB (has no administration in time range)  HYDROcodone-acetaminophen (NORCO/VICODIN) 5-325 MG per tablet 1 tablet (has no administration in time range)  lactated ringers bolus 1,000 mL (0 mLs Intravenous Stopped 06/03/21 2137)  HYDROmorphone (DILAUDID) injection 1 mg (1 mg Intravenous Given 06/03/21 1929)  promethazine (PHENERGAN) 25 MG/ML injection (12.5 mg Intravenous Given 06/03/21 1946)  HYDROmorphone (DILAUDID) injection 1 mg (1 mg Intravenous Given 06/03/21 2100)  ondansetron (ZOFRAN) injection 4 mg (4 mg Intravenous Given 06/03/21 2100)    ED Course/ Medical Decision Making/ A&P  Medical Decision Making Risk Parenteral controlled substances.  This patient presents to the ED for concern of abdominal pain, this involves an extensive number of treatment options, and is a complaint that carries with it a high risk of complications and morbidity. The emergent differential diagnosis includes, but is not limited to, AAA, mesenteric ischemia, appendicitis, diverticulitis, DKA, gastritis, gastroenteritis, AMI, nephrolithiasis, pancreatitis, peritonitis, adrenal insufficiency,lead poisoning, iron toxicity, intestinal ischemia, constipation, UTI,SBO/LBO, splenic rupture, biliary disease, IBD, IBS, PUD, or hepatitis.  Co morbidities that complicate the patient evaluation: chronic pancreatitis, diverticulitis, nephrolithiasis  Additional history obtained from chart review. External records from outside source obtained and reviewed including numerous visits for similar symptoms, patient states that he has follow-up scheduled with surgery to discuss long-term management of his symptoms.  Physical exam performed. The pertinent findings include: Overall abdomen is soft, with tenderness to palpation in the  left upper quadrant and epigastrium.  There is mild guarding, with no rigidity.  Overall abdominal exam nonsurgical.  Lab Tests: I Ordered, and personally interpreted labs.  The pertinent results include: Mild leukocytosis to 12.7, electrolytes within normal limits, normal kidney function, normal liver function, normal lipase.  Urinalysis overall unremarkable.   Imaging Studies: As patient's just had imaging of his abdomen done 2 days ago that showed no acute findings, and he states his pain feels the same without significant change in characteristic, I have low concern for acute progression of his disease process.  At this time we will defer imaging.   Medicines ordered and prescription drug management: I ordered medication including IV fluids, analgesics, antiemetics for acute abdominal pain. Reevaluation of the patient after these medicines showed that the patient improved I have reviewed the patients home medicines and have made adjustments as needed  Dispostion: After consideration of the diagnostic results and the patients response to treatment, I feel that patient is not requiring admission or inpatient treatment for his symptoms.  Advised that it is incredibly poor and that he follow-up with his primary doctor, GI doctor, and surgical team to treat his symptoms in the outpatient setting.  I have low suspicion for other acute etiology for his symptoms as listed above, the pattern of his symptoms and physical exam is most clinically correlated with flare of chronic pancreatitis.  Pain and nausea controlled with medication in the ER.  He had no episodes of vomiting while during his almost 5-hour observation.  His repeat abdominal exam remains nonsurgical.  Discussed pain management in the outpatient setting, patient is stable for discharge.  Discussed reasons to return to the emergency department, patient is agreeable to the plan.  Final Clinical Impression(s) / ED Diagnoses Final diagnoses:   Chronic pancreatitis, unspecified pancreatitis type ([place])  Chronic abdominal pain    Rx / DC Orders ED Discharge Orders     None      Portions of this report may have been transcribed using voice recognition software. Every effort was made to ensure accuracy; however, inadvertent computerized transcription errors may be present.    Estill Cotta 06/03/21 2217    Blanchie Dessert, MD 06/03/21 2337

## 2021-06-07 ENCOUNTER — Emergency Department (HOSPITAL_BASED_OUTPATIENT_CLINIC_OR_DEPARTMENT_OTHER): Payer: No Typology Code available for payment source

## 2021-06-07 ENCOUNTER — Encounter (HOSPITAL_BASED_OUTPATIENT_CLINIC_OR_DEPARTMENT_OTHER): Payer: Self-pay | Admitting: Emergency Medicine

## 2021-06-07 ENCOUNTER — Other Ambulatory Visit: Payer: Self-pay

## 2021-06-07 ENCOUNTER — Emergency Department (HOSPITAL_BASED_OUTPATIENT_CLINIC_OR_DEPARTMENT_OTHER)
Admission: EM | Admit: 2021-06-07 | Discharge: 2021-06-07 | Disposition: A | Discharge status: Home / Self Care | Payer: No Typology Code available for payment source | Attending: Emergency Medicine | Admitting: Emergency Medicine

## 2021-06-07 DIAGNOSIS — R112 Nausea with vomiting, unspecified: Secondary | ICD-10-CM | POA: Insufficient documentation

## 2021-06-07 DIAGNOSIS — I10 Essential (primary) hypertension: Secondary | ICD-10-CM | POA: Insufficient documentation

## 2021-06-07 DIAGNOSIS — Z79899 Other long term (current) drug therapy: Secondary | ICD-10-CM | POA: Insufficient documentation

## 2021-06-07 DIAGNOSIS — R109 Unspecified abdominal pain: Secondary | ICD-10-CM

## 2021-06-07 DIAGNOSIS — R197 Diarrhea, unspecified: Secondary | ICD-10-CM | POA: Insufficient documentation

## 2021-06-07 DIAGNOSIS — R1084 Generalized abdominal pain: Secondary | ICD-10-CM | POA: Diagnosis not present

## 2021-06-07 DIAGNOSIS — R1010 Upper abdominal pain, unspecified: Secondary | ICD-10-CM | POA: Diagnosis present

## 2021-06-07 LAB — URINALYSIS, ROUTINE W REFLEX MICROSCOPIC
Bilirubin Urine: NEGATIVE
Glucose, UA: NEGATIVE mg/dL
Hgb urine dipstick: NEGATIVE
Ketones, ur: NEGATIVE mg/dL
Leukocytes,Ua: NEGATIVE
Nitrite: NEGATIVE
Protein, ur: NEGATIVE mg/dL
Specific Gravity, Urine: 1.012 (ref 1.005–1.030)
pH: 6 (ref 5.0–8.0)

## 2021-06-07 LAB — COMPREHENSIVE METABOLIC PANEL
ALT: 26 U/L (ref 0–44)
AST: 21 U/L (ref 15–41)
Albumin: 4.6 g/dL (ref 3.5–5.0)
Alkaline Phosphatase: 81 U/L (ref 38–126)
Anion gap: 9 (ref 5–15)
BUN: 15 mg/dL (ref 6–20)
CO2: 27 mmol/L (ref 22–32)
Calcium: 9.7 mg/dL (ref 8.9–10.3)
Chloride: 104 mmol/L (ref 98–111)
Creatinine, Ser: 0.86 mg/dL (ref 0.61–1.24)
GFR, Estimated: >60 mL/min (ref >60)
Glucose, Bld: 104 mg/dL — ABNORMAL HIGH (ref 70–99)
Potassium: 3.3 mmol/L — ABNORMAL LOW (ref 3.5–5.1)
Sodium: 140 mmol/L (ref 135–145)
Total Bilirubin: 0.5 mg/dL (ref 0.3–1.2)
Total Protein: 7.7 g/dL (ref 6.5–8.1)

## 2021-06-07 LAB — CBC
HCT: 46.2 % (ref 39.0–52.0)
Hemoglobin: 16.1 g/dL (ref 13.0–17.0)
MCH: 29.7 pg (ref 26.0–34.0)
MCHC: 34.8 g/dL (ref 30.0–36.0)
MCV: 85.2 fL (ref 80.0–100.0)
Platelets: 250 10*3/uL (ref 150–400)
RBC: 5.42 MIL/uL (ref 4.22–5.81)
RDW: 13.2 % (ref 11.5–15.5)
WBC: 12.4 10*3/uL — ABNORMAL HIGH (ref 4.0–10.5)
nRBC: 0 % (ref 0.0–0.2)

## 2021-06-07 LAB — LIPASE, BLOOD: Lipase: 32 U/L (ref 11–51)

## 2021-06-07 MED ORDER — SODIUM CHLORIDE 0.9 % IV BOLUS
1000.0000 mL | Freq: Once | INTRAVENOUS | Status: AC
  Administered 2021-06-07: 1000 mL via INTRAVENOUS

## 2021-06-07 MED ORDER — ONDANSETRON HCL 4 MG/2ML IJ SOLN
4.0000 mg | Freq: Once | INTRAMUSCULAR | Status: AC
  Administered 2021-06-07: 4 mg via INTRAVENOUS
  Filled 2021-06-07: qty 2

## 2021-06-07 MED ORDER — HYDROMORPHONE HCL 1 MG/ML IJ SOLN
0.5000 mg | Freq: Once | INTRAMUSCULAR | Status: AC
  Administered 2021-06-07: 0.5 mg via INTRAVENOUS
  Filled 2021-06-07: qty 1

## 2021-06-07 MED ORDER — HYDROMORPHONE HCL 1 MG/ML IJ SOLN
1.0000 mg | Freq: Once | INTRAMUSCULAR | Status: AC
  Administered 2021-06-07: 1 mg via INTRAVENOUS
  Filled 2021-06-07: qty 1

## 2021-06-07 MED ORDER — MORPHINE SULFATE (PF) 4 MG/ML IV SOLN
4.0000 mg | Freq: Once | INTRAVENOUS | Status: AC
  Administered 2021-06-07: 4 mg via INTRAVENOUS
  Filled 2021-06-07: qty 1

## 2021-06-07 NOTE — ED Notes (Signed)
Patient transported to CT 

## 2021-06-07 NOTE — Discharge Instructions (Signed)
You have been seen and discharged from the emergency department.  You need to follow-up with your GI specialist for further guidance and evaluation/treatment.  Follow-up with your primary provider for further evaluation and further care. Take home medications as prescribed. If you have any worsening symptoms or further concerns for your health please return to an emergency department for further evaluation.

## 2021-06-07 NOTE — ED Notes (Signed)
Pt discharged home after verbalizing understanding of discharge instructions; nad noted. 

## 2021-06-07 NOTE — ED Provider Notes (Signed)
[facility] EMERGENCY DEPT Provider Note   CSN: [id number] Arrival date & time: 06/07/21  0750     History  Chief Complaint  Patient presents with   Abdominal Pain    [name] is a 46 y.o. male.  HPI  46 year old male with past medical history of chronic pancreatitis, cholecystectomy, appendectomy, HTN, diverticulitis, kidney stones presents emergency department concern for abdominal pain.  Patient states for the past 2 weeks he has been dealing with diffuse/upper abdominal pain associated with nausea/vomiting/diarrhea.  Patient states this is happened before when he has been diagnosed chronic pancreatitis.  He is trying to get into his PCP and GI doctor as an outpatient but has been unsuccessful.  Is been trying over-the-counter and prescription medications without any relief.  Presents today with worsening symptoms.  Denies any fever.  No genitourinary symptoms.  Home Medications Prior to Admission medications   Medication Sig Start Date End Date Taking? Authorizing Provider  amLODipine (NORVASC) 5 MG tablet Take 1 tablet (5 mg total) by mouth daily. 04/29/21   [place], Betty G, MD  benazepril (LOTENSIN) 40 MG tablet Take 1 tablet (40 mg total) by mouth daily. 04/29/21   [place], Betty G, MD  fluticasone (FLONASE) 50 MCG/ACT nasal spray Place 1 spray into both nostrils 2 (two) times daily. 05/26/21   [place], Betty G, MD  KLOR-CON M20 20 MEQ tablet TAKE 1 TABLET BY MOUTH EVERY DAY 05/30/21   [place], Betty G, MD  metoprolol succinate (TOPROL-XL) 100 MG 24 hr tablet TAKE 1 TABLET BY MOUTH DAILY. TAKE WITH OR IMMEDIATELY FOLLOWING A MEAL. 05/01/21   [place], Betty G, MD  naloxone [facility]) nasal spray 4 mg/0.1 mL 1 spray in each nostril x 1 if opioid overdose. 06/08/20   [place], Betty G, MD  ondansetron (ZOFRAN ODT) 4 MG disintegrating tablet Place 1 tablet under the tongue every 12 hours PRN nausea 04/03/21   [place], Betty G, MD  oxyCODONE (OXY IR/ROXICODONE) 5 MG immediate  release tablet Take 1 tablet (5 mg total) by mouth 2 (two) times daily as needed for up to 14 days for moderate pain or severe pain. 05/26/21 06/09/21  [place], Betty G, MD  pantoprazole (PROTONIX) 40 MG tablet Take 1 tablet (40 mg total) by mouth daily. Patient taking differently: Take 40 mg by mouth daily as needed (heart burn). 12/25/20   Lajean Saver, MD  promethazine (PHENERGAN) 25 MG suppository Place 1 suppository (25 mg total) rectally every 6 (six) hours as needed for nausea or vomiting. 99/24/26   Delora Fuel, MD  promethazine (PHENERGAN) 25 MG tablet Take 1 tablet (25 mg total) by mouth every 6 (six) hours as needed. 83/41/96   Delora Fuel, MD  rosuvastatin (CRESTOR) 10 MG tablet Take 1 tablet (10 mg total) by mouth daily. 05/23/21   [place], Betty G, MD  sucralfate (CARAFATE) 1 g tablet Take 1 tablet (1 g total) by mouth 4 (four) times daily -  with meals and at bedtime. 06/01/21 08/30/21  Farrel Gordon, DO      Allergies    Cortisone, Eggs or egg-derived products, Ketorolac, Ketorolac tromethamine, Haldol [haloperidol lactate], Haloperidol, Hydrocortisone, Iodinated contrast media, Ketorolac tromethamine, Reglan [metoclopramide], and Compazine [prochlorperazine]    Review of Systems   Review of Systems  Constitutional:  Positive for appetite change. Negative for fever.  Respiratory:  Negative for shortness of breath.   Cardiovascular:  Negative for chest pain.  Gastrointestinal:  Positive for abdominal pain, diarrhea, nausea and vomiting. Negative for blood in  stool and constipation.  Genitourinary:  Negative for dysuria and flank pain.  Musculoskeletal:  Negative for back pain.  Skin:  Negative for rash.  Neurological:  Negative for headaches.   Physical Exam Updated Vital Signs BP (!) 173/101 (BP Location: Right Arm)    Pulse 82    Temp 98.3 F (36.8 C) (Oral)    Resp 20    Ht 6\' 1"  (1.854 m)    Wt 93 kg    SpO2 99%    BMI 27.05 kg/m  Physical Exam Vitals and nursing note  reviewed.  Constitutional:      General: He is not in acute distress.    Appearance: Normal appearance.  HENT:     Head: Normocephalic.     Mouth/Throat:     Mouth: Mucous membranes are moist.  Cardiovascular:     Rate and Rhythm: Normal rate.  Pulmonary:     Effort: Pulmonary effort is normal. No respiratory distress.  Abdominal:     General: There is no distension.     Palpations: Abdomen is soft.     Tenderness: There is generalized abdominal tenderness. There is no guarding or rebound.  Skin:    General: Skin is warm.  Neurological:     Mental Status: He is alert and oriented to person, place, and time. Mental status is at baseline.  Psychiatric:        Mood and Affect: Mood normal.    ED Results / Procedures / Treatments   Labs (all labs ordered are listed, but only abnormal results are displayed) Labs Reviewed  CBC - Abnormal; Notable for the following components:      Result Value   WBC 12.4 (*)    All other components within normal limits  LIPASE, BLOOD  COMPREHENSIVE METABOLIC PANEL  URINALYSIS, ROUTINE W REFLEX MICROSCOPIC    EKG None  Radiology No results found.  Procedures Procedures    Medications Ordered in ED Medications  sodium chloride 0.9 % bolus 1,000 mL (1,000 mLs Intravenous New Bag/Given 06/07/21 0842)  morphine 4 MG/ML injection 4 mg (4 mg Intravenous Given 06/07/21 1610)    ED Course/ Medical Decision Making/ A&P                           Medical Decision Making  This patient presents to the ED for concern of abdominal pain, this involves an extensive number of treatment options, and is a complaint that carries with it a high risk of complications and morbidity.  The differential diagnosis includes abdominal pain, chronic pancreatitis, gastritis, bowel obstruction, chronic abdominal pain   Additional history obtained: -External records from outside source obtained and reviewed including: Chart review including previous notes, labs,  imaging, consultation notes   Lab Tests: -I ordered, reviewed, and interpreted labs.  The pertinent results include: No acute abnormalities   EKG -Normal QTC   Imaging Studies ordered: -I ordered imaging studies including CT of the abdomen pelvis -I independently visualized and interpreted imaging which showed no acute findings -I agree with the radiologist interpretation   Medicines ordered and prescription drug management: -I ordered medication including pain medicine, IV fluids and nausea medicine for symptom control -Reevaluation of the patient after these medicines showed that the patient improved -I have reviewed the patients home medicines and have made adjustments as needed   ED Course: 46 year old male presents emergency department with acute on chronic abdominal pain.  Of note patient has been seen multiple  times in the past for the same complaints.  He has a tender abdomen on evaluation, vitals are stable, no abdominal distention.  Blood work is reassuring, lipase is normal.  However this can be the case in chronic pancreatitis.  Proceeded with CT of the abdomen pelvis which identified no acute pathology.  Patient was treated symptomatically for his symptoms with IV medication.  He had improvement and plan is to follow-up as an outpatient with his GI specialist.  I do not appreciate any acute pathology today.   Cardiac Monitoring: The patient was maintained on a cardiac monitor.  I personally viewed and interpreted the cardiac monitored which showed an underlying rhythm of: Sinus rhythm   Reevaluation: After the interventions noted above, I reevaluated the patient and found that they have :improved   Dispostion: Patient at this time appears safe and stable for discharge and close outpatient follow up. Discharge plan and strict return to ED precautions discussed, patient verbalizes understanding and agreement.        Final Clinical Impression(s) / ED  Diagnoses Final diagnoses:  None    Rx / DC Orders ED Discharge Orders     None         Lorelle Gibbs, DO 06/07/21 1241

## 2021-06-07 NOTE — ED Triage Notes (Signed)
Pt via pov from home; states he has chronic pancreatitis, states he believes something else is going on but is unable to be seen by his pcp or GI doc. Pt reports this episode has been ongoing for 2 weeks. Pt alert & oriented, reports emesis all night while at work. NAD noted.

## 2021-06-09 ENCOUNTER — Ambulatory Visit: Payer: No Typology Code available for payment source | Admitting: Family Medicine

## 2021-06-09 ENCOUNTER — Encounter: Payer: Self-pay | Admitting: Family Medicine

## 2021-06-09 VITALS — BP 136/80 | HR 82 | Resp 16 | Ht 73.0 in | Wt 205.0 lb

## 2021-06-09 DIAGNOSIS — E782 Mixed hyperlipidemia: Secondary | ICD-10-CM | POA: Diagnosis not present

## 2021-06-09 DIAGNOSIS — G894 Chronic pain syndrome: Secondary | ICD-10-CM

## 2021-06-09 DIAGNOSIS — E876 Hypokalemia: Secondary | ICD-10-CM

## 2021-06-09 DIAGNOSIS — R112 Nausea with vomiting, unspecified: Secondary | ICD-10-CM | POA: Diagnosis not present

## 2021-06-09 DIAGNOSIS — I700 Atherosclerosis of aorta: Secondary | ICD-10-CM

## 2021-06-09 DIAGNOSIS — I10 Essential (primary) hypertension: Secondary | ICD-10-CM

## 2021-06-09 MED ORDER — OXYCODONE HCL 5 MG PO TABS: 5.0000 mg | ORAL_TABLET | Freq: Two times a day (BID) | ORAL | 0 refills | Status: AC | PRN

## 2021-06-09 NOTE — Assessment & Plan Note (Signed)
He has already established with pain management and a plan has been discussed. Today I sent the last prescription for oxycodone 5 mg to continue twice daily as needed. [name] controlled substance report reviewed. Some opioid side effects discussed. Continue to follow with pain management.

## 2021-06-09 NOTE — Patient Instructions (Signed)
A few things to remember from today's visit:  Hypokalemia  Chronic pain disorder  Nausea and vomiting in adult  If you need refills please call your pharmacy. Do not use My Chart to request refills or for acute issues that need immediate attention.   I will continue Oxycodone until your next appt with pain management, 06/18/21. I am hoping they also take over Phenergan. Keep appt with gastroenterologist. No changes in blood pressure meds or cholesterol med. Next visit will do fasting labs.  Please be sure medication list is accurate. If a new problem present, please set up appointment sooner than planned today.

## 2021-06-09 NOTE — Assessment & Plan Note (Signed)
Continue Crestor 10 mg daily. We will plan on checking fasting lipid panel next visit.

## 2021-06-09 NOTE — Progress Notes (Signed)
HPI:  [name] is a 46 y.o. male, who is here today to follow on recent ED visits. He was evaluated in the ED on 06/01/21,06/03/21,and 06/07/21. Nausea,vomiting,and left-sided pain. Vomited x 8-9 times before last ED visit. Hx of chronic abdominal pain,nausea,and vomiting with frequent ER visits. He has not identified exacerbating or alleviating factors. Hypokalemia: Currently he is on 20 mEq of K-Lor daily.  Lab Results  Component Value Date   CREATININE 0.86 06/07/2021   BUN 15 06/07/2021   NA 140 06/07/2021   K 3.3 (L) 06/07/2021   CL 104 06/07/2021   CO2 27 06/07/2021   Lab Results  Component Value Date   ALT 26 06/07/2021   AST 21 06/07/2021   ALKPHOS 81 06/07/2021   BILITOT 0.5 06/07/2021   He has an appt with GI on 07/12/21. Nausea and vomiting, currently is on Phenergan 25 mg, last prescribed by Dr Delora Fuel to take qid prn.  He is also on ondansetron 4 mg every 12 hours as needed. He has tried Reglan, has side effects.  He has already established with pain management, seen on 06/02/2021.  According to patient, medication was not started at this time, a plan was discussed, and planning on follow up 06/18/2021.  He is requesting 1 last prescription of oxycodone 5 mg, which he has been taking twice daily as needed.  + Tobacco use, she is down to  5-6 cig/day.  HLD on Crestor 10 mg daily, started it in 04/2021. He has tolerated medication well. Aortic atherosclerosis seen on imaging, abdominal CT on 05/04/2021.  Lab Results  Component Value Date   CHOL 246 (H) 05/16/2021   HDL 47.20 05/16/2021   LDLCALC 113 (H) 06/03/2015   LDLDIRECT 170.0 05/16/2021   TRIG 273.0 (H) 05/16/2021   CHOLHDL 5 05/16/2021  Hypertension on Benicar 40 mg daily and amlodipine 5 mg daily. He has tolerated medication well. Negative for unusual/frequent headache, CP, palpitations, dyspnea, focal neurologic deficit, or edema.  Review of Systems  Constitutional:  Negative for  activity change, appetite change and fever.  HENT:  Negative for nosebleeds, sore throat and trouble swallowing.   Eyes:  Negative for redness and visual disturbance.  Respiratory:  Negative for cough and wheezing.   Gastrointestinal:  Positive for abdominal pain, nausea and vomiting.  Genitourinary:  Negative for decreased urine volume, dysuria and hematuria.  Neurological:  Negative for dizziness, syncope and facial asymmetry.  Rest see pertinent positives and negatives per HPI.  Current Outpatient Medications on File Prior to Visit  Medication Sig Dispense Refill   amLODipine (NORVASC) 5 MG tablet Take 1 tablet (5 mg total) by mouth daily. 90 tablet 1   benazepril (LOTENSIN) 40 MG tablet Take 1 tablet (40 mg total) by mouth daily. 90 tablet 1   fluticasone (FLONASE) 50 MCG/ACT nasal spray Place 1 spray into both nostrils 2 (two) times daily. 16 g 2   KLOR-CON M20 20 MEQ tablet TAKE 1 TABLET BY MOUTH EVERY DAY 30 tablet 0   metoprolol succinate (TOPROL-XL) 100 MG 24 hr tablet TAKE 1 TABLET BY MOUTH DAILY. TAKE WITH OR IMMEDIATELY FOLLOWING A MEAL. 90 tablet 1   naloxone (NARCAN) nasal spray 4 mg/0.1 mL 1 spray in each nostril x 1 if opioid overdose. 1 each 1   ondansetron (ZOFRAN ODT) 4 MG disintegrating tablet Place 1 tablet under the tongue every 12 hours PRN nausea 20 tablet 0   pantoprazole (PROTONIX) 40 MG tablet Take 1 tablet (40 mg  total) by mouth daily. (Patient taking differently: Take 40 mg by mouth daily as needed (heart burn).) 30 tablet 0   promethazine (PHENERGAN) 25 MG suppository Place 1 suppository (25 mg total) rectally every 6 (six) hours as needed for nausea or vomiting. 12 each 0   promethazine (PHENERGAN) 25 MG tablet Take 1 tablet (25 mg total) by mouth every 6 (six) hours as needed. 45 tablet 1   rosuvastatin (CRESTOR) 10 MG tablet Take 1 tablet (10 mg total) by mouth daily. 30 tablet 3   sucralfate (CARAFATE) 1 g tablet Take 1 tablet (1 g total) by mouth 4 (four)  times daily -  with meals and at bedtime. 360 tablet 0   No current facility-administered medications on file prior to visit.    Past Medical History:  Diagnosis Date   Chronic abdominal pain    Diverticulitis    Drug-seeking behavior    Hypertension    Kidney stones    Pancreatitis, chronic ([place]) 05/28/2005   Allergies  Allergen Reactions   Cortisone Other (See Comments)    drops potassium level "bottoms out" potassium level drops potassium level Bottoms out potassium  "bottoms out" potassium level Other reaction(s): Other (See Comments) Bottoms out potassium   Eggs Or Egg-Derived Products Hives and Other (See Comments)    Rash  Rash   Ketorolac Hives and Other (See Comments)    Hives, slightly labored breathing  Hives, slightly labored breathing   Ketorolac Tromethamine Hives and Shortness Of Breath   Haldol [Haloperidol Lactate] Other (See Comments)    "jittery"    Haloperidol Other (See Comments)    "jittery" "jittery"    Hydrocortisone Hives    Also drops potassium level   Iodinated Contrast Media Nausea And Vomiting and Other (See Comments)    Hives Hives Hives Hives   Ketorolac Tromethamine Hives   Reglan [Metoclopramide]     Face "draws" and gets figgety   Compazine [Prochlorperazine] Other (See Comments)    Dystonic reaction    Social History   Socioeconomic History   Marital status: Married    Spouse name: Not on file   Number of children: 1   Years of education: Not on file   Highest education level: Some college, no degree  Occupational History   Not on file  Tobacco Use   Smoking status: Every Day    Packs/day: 0.50    Types: Cigarettes   Smokeless tobacco: Never  Vaping Use   Vaping Use: Never used  Substance and Sexual Activity   Alcohol use: Not Currently    Comment: rarely   Drug use: No   Sexual activity: Not on file  Other Topics Concern   Not on file  Social History Narrative   Not on file   Social Determinants of  Health   Financial Resource Strain: Low Risk    Difficulty of Paying Living Expenses: Not hard at all  Food Insecurity: No Food Insecurity   Worried About Charity fundraiser in the Last Year: Never true   Ran Out of Food in the Last Year: Never true  Transportation Needs: No Transportation Needs   Lack of Transportation (Medical): No   Lack of Transportation (Non-Medical): No  Physical Activity: Insufficiently Active   Days of Exercise per Week: 1 day   Minutes of Exercise per Session: 20 min  Stress: No Stress Concern Present   Feeling of Stress : Only a little  Social Connections: Engineer, building services of Communication  with Friends and Family: Three times a week   Frequency of Social Gatherings with Friends and Family: Once a week   Attends Religious Services: More than 4 times per year   Active Member of [employer] or Organizations: Yes   Attends Archivist Meetings: More than 4 times per year   Marital Status: Married   Vitals:   06/09/21 1143  BP: 136/80  Pulse: 82  Resp: 16  SpO2: 97%   Body mass index is 27.05 kg/m.  Physical Exam Vitals and nursing note reviewed.  Constitutional:      General: He is not in acute distress.    Appearance: He is well-developed.  HENT:     Head: Normocephalic and atraumatic.  Eyes:     Conjunctiva/sclera: Conjunctivae normal.  Cardiovascular:     Rate and Rhythm: Normal rate and regular rhythm.     Pulses:          Posterior tibial pulses are 2+ on the right side and 2+ on the left side.     Heart sounds: No murmur heard. Pulmonary:     Effort: Pulmonary effort is normal. No respiratory distress.     Breath sounds: Normal breath sounds.  Abdominal:     Palpations: Abdomen is soft. There is no hepatomegaly, splenomegaly or mass.     Tenderness: There is abdominal tenderness in the epigastric area and left upper quadrant. There is no guarding or rebound.  Lymphadenopathy:     Cervical: No cervical adenopathy.   Skin:    General: Skin is warm.     Findings: No erythema or rash.  Neurological:     Mental Status: He is alert and oriented to person, place, and time.     Cranial Nerves: No cranial nerve deficit.     Gait: Gait normal.  Psychiatric:     Comments: Well groomed, good eye contact.   ASSESSMENT AND PLAN:  Mr.Naftula was seen today for hospitalization follow-up.  Diagnoses and all orders for this visit:  Hypokalemia Mostly caused by episodes of vomiting prior to ED visits. Continue K-Lor 20 mEq daily.  Nausea and vomiting in adult We discussed possible etiologies,?  Gastroparesis. Continue Phenergan 25 mg twice daily as needed and Zofran 4 mg twice daily twice daily. Keep next appointment with GI.  Hyperlipidemia, mixed Continue Crestor 10 mg daily. We will plan on checking fasting lipid panel next visit.  Chronic pain disorder He has already established with pain management and a plan has been discussed. Today I sent the last prescription for oxycodone 5 mg to continue twice daily as needed. [place] controlled substance report reviewed. Some opioid side effects discussed. Continue to follow with pain management.  Hypertension, essential, benign BP is overall adequately controlled. Continue benazepril 40 mg daily and amlodipine 5 mg daily. Continue low-salt diet and monitoring BP at home.  Atherosclerosis of aorta ([place]) 04/2021 LDL 113. Continue Crestor 10 mg daily. Will plan on checking FLP next visit.   Return in about 5 months (around 11/07/2021).   Lanell Dubie G. Martinique, MD  [facility]. [place] office.

## 2021-06-09 NOTE — Assessment & Plan Note (Signed)
BP is overall adequately controlled. Continue benazepril 40 mg daily and amlodipine 5 mg daily. Continue low-salt diet and monitoring BP at home.

## 2021-06-09 NOTE — Assessment & Plan Note (Signed)
We discussed possible etiologies,?  Gastroparesis. Continue Phenergan 25 mg twice daily as needed and Zofran 4 mg twice daily twice daily. Keep next appointment with GI.

## 2021-06-09 NOTE — Assessment & Plan Note (Addendum)
04/2021 LDL 113. Continue Crestor 10 mg daily. Will plan on checking FLP next visit.

## 2021-06-09 NOTE — Assessment & Plan Note (Signed)
Mostly caused by episodes of vomiting prior to ED visits. Continue K-Lor 20 mEq daily.

## 2021-06-13 ENCOUNTER — Emergency Department (HOSPITAL_BASED_OUTPATIENT_CLINIC_OR_DEPARTMENT_OTHER)
Admission: EM | Admit: 2021-06-13 | Discharge: 2021-06-13 | Disposition: A | Discharge status: Home / Self Care | Payer: No Typology Code available for payment source | Attending: Emergency Medicine | Admitting: Emergency Medicine

## 2021-06-13 ENCOUNTER — Encounter (HOSPITAL_BASED_OUTPATIENT_CLINIC_OR_DEPARTMENT_OTHER): Payer: Self-pay

## 2021-06-13 ENCOUNTER — Other Ambulatory Visit: Payer: Self-pay

## 2021-06-13 DIAGNOSIS — K861 Other chronic pancreatitis: Secondary | ICD-10-CM | POA: Insufficient documentation

## 2021-06-13 DIAGNOSIS — Z79899 Other long term (current) drug therapy: Secondary | ICD-10-CM | POA: Diagnosis not present

## 2021-06-13 DIAGNOSIS — R112 Nausea with vomiting, unspecified: Secondary | ICD-10-CM

## 2021-06-13 LAB — CBC WITH DIFFERENTIAL/PLATELET
Abs Immature Granulocytes: 0.07 10*3/uL (ref 0.00–0.07)
Basophils Absolute: 0.1 10*3/uL (ref 0.0–0.1)
Basophils Relative: 1 %
Eosinophils Absolute: 0.4 10*3/uL (ref 0.0–0.5)
Eosinophils Relative: 3 %
HCT: 47.6 % (ref 39.0–52.0)
Hemoglobin: 16.4 g/dL (ref 13.0–17.0)
Immature Granulocytes: 1 %
Lymphocytes Relative: 35 %
Lymphs Abs: 4.6 10*3/uL — ABNORMAL HIGH (ref 0.7–4.0)
MCH: 29.8 pg (ref 26.0–34.0)
MCHC: 34.5 g/dL (ref 30.0–36.0)
MCV: 86.4 fL (ref 80.0–100.0)
Monocytes Absolute: 1.1 10*3/uL — ABNORMAL HIGH (ref 0.1–1.0)
Monocytes Relative: 9 %
Neutro Abs: 6.7 10*3/uL (ref 1.7–7.7)
Neutrophils Relative %: 51 %
Platelets: 244 10*3/uL (ref 150–400)
RBC: 5.51 MIL/uL (ref 4.22–5.81)
RDW: 12.9 % (ref 11.5–15.5)
WBC: 13 10*3/uL — ABNORMAL HIGH (ref 4.0–10.5)
nRBC: 0 % (ref 0.0–0.2)

## 2021-06-13 LAB — LIPASE, BLOOD: Lipase: 69 U/L — ABNORMAL HIGH (ref 11–51)

## 2021-06-13 LAB — URINALYSIS, ROUTINE W REFLEX MICROSCOPIC
Bilirubin Urine: NEGATIVE
Glucose, UA: NEGATIVE mg/dL
Hgb urine dipstick: NEGATIVE
Ketones, ur: NEGATIVE mg/dL
Leukocytes,Ua: NEGATIVE
Nitrite: NEGATIVE
Protein, ur: NEGATIVE mg/dL
Specific Gravity, Urine: 1.016 (ref 1.005–1.030)
pH: 5.5 (ref 5.0–8.0)

## 2021-06-13 LAB — COMPREHENSIVE METABOLIC PANEL
ALT: 27 U/L (ref 0–44)
AST: 24 U/L (ref 15–41)
Albumin: 4.6 g/dL (ref 3.5–5.0)
Alkaline Phosphatase: 80 U/L (ref 38–126)
Anion gap: 10 (ref 5–15)
BUN: 9 mg/dL (ref 6–20)
CO2: 26 mmol/L (ref 22–32)
Calcium: 9.4 mg/dL (ref 8.9–10.3)
Chloride: 103 mmol/L (ref 98–111)
Creatinine, Ser: 0.79 mg/dL (ref 0.61–1.24)
GFR, Estimated: >60 mL/min (ref >60)
Glucose, Bld: 151 mg/dL — ABNORMAL HIGH (ref 70–99)
Potassium: 3.3 mmol/L — ABNORMAL LOW (ref 3.5–5.1)
Sodium: 139 mmol/L (ref 135–145)
Total Bilirubin: 0.5 mg/dL (ref 0.3–1.2)
Total Protein: 7.6 g/dL (ref 6.5–8.1)

## 2021-06-13 MED ORDER — OXYCODONE HCL 5 MG PO TABS
5.0000 mg | ORAL_TABLET | Freq: Once | ORAL | Status: AC
  Administered 2021-06-13: 5 mg via ORAL
  Filled 2021-06-13: qty 1

## 2021-06-13 MED ORDER — ONDANSETRON HCL 4 MG/2ML IJ SOLN
4.0000 mg | Freq: Once | INTRAMUSCULAR | Status: AC
Start: 2021-06-13 — End: 2021-06-13
  Administered 2021-06-13: 4 mg via INTRAVENOUS
  Filled 2021-06-13: qty 2

## 2021-06-13 MED ORDER — SODIUM CHLORIDE 0.9 % IV BOLUS
1000.0000 mL | Freq: Once | INTRAVENOUS | Status: AC
  Administered 2021-06-13: 1000 mL via INTRAVENOUS

## 2021-06-13 NOTE — ED Provider Notes (Signed)
[place] EMERGENCY DEPT Provider Note   CSN: [id number] Arrival date & time: 06/13/21  2993     History  Chief Complaint  Patient presents with   Emesis    [name] is a 46 y.o. male.  Patient is a 46 year old male who presents with abdominal pain associated nausea and vomiting.  He has chronic recurrent episodes of upper abdominal pain associate with nausea and vomiting.  He reports he has a history of chronic pancreatitis.  He has previously seen gastroenterology and had multiple studies including endoscopy/colonoscopy.  He has a PCP and has an upcoming appointment with pain management.  According to chart review, he recently saw his PCP who states that he has an appointment on January 22 to start prescription medications initiated by the pain medicine doctors.  His PCP gave him a prescription for oxycodone to last him until that appointment.  He reports that he has had some worsening nausea and vomiting over the last 2 days.  No hematemesis.  No fevers.  He has upper abdominal pain similar to his prior episodes.  No change in his stools.      Home Medications Prior to Admission medications   Medication Sig Start Date End Date Taking? Authorizing Provider  amLODipine (NORVASC) 5 MG tablet Take 1 tablet (5 mg total) by mouth daily. 04/29/21   Martinique, Betty G, MD  benazepril (LOTENSIN) 40 MG tablet Take 1 tablet (40 mg total) by mouth daily. 04/29/21   Martinique, Betty G, MD  fluticasone (FLONASE) 50 MCG/ACT nasal spray Place 1 spray into both nostrils 2 (two) times daily. 05/26/21   Martinique, Betty G, MD  KLOR-CON M20 20 MEQ tablet TAKE 1 TABLET BY MOUTH EVERY DAY 05/30/21   Martinique, Betty G, MD  metoprolol succinate (TOPROL-XL) 100 MG 24 hr tablet TAKE 1 TABLET BY MOUTH DAILY. TAKE WITH OR IMMEDIATELY FOLLOWING A MEAL. 05/01/21   Martin[place], Betty G, MD  naloxone [facility]) nasal spray 4 mg/0.1 mL 1 spray in each nostril x 1 if opioid overdose. 06/08/20   Martinique, Betty G, MD   ondansetron (ZOFRAN ODT) 4 MG disintegrating tablet Place 1 tablet under the tongue every 12 hours PRN nausea 04/03/21   Martinique, Betty G, MD  oxyCODONE (OXY IR/ROXICODONE) 5 MG immediate release tablet Take 1 tablet (5 mg total) by mouth 2 (two) times daily as needed for up to 10 days for moderate pain or severe pain. 06/09/21 06/19/21  Martinique, Betty G, MD  pantoprazole (PROTONIX) 40 MG tablet Take 1 tablet (40 mg total) by mouth daily. Patient taking differently: Take 40 mg by mouth daily as needed (heart burn). 12/25/20   Lajean Saver, MD  promethazine (PHENERGAN) 25 MG suppository Place 1 suppository (25 mg total) rectally every 6 (six) hours as needed for nausea or vomiting. 71/69/67   Delora Fuel, MD  promethazine (PHENERGAN) 25 MG tablet Take 1 tablet (25 mg total) by mouth every 6 (six) hours as needed. 89/38/10   Delora Fuel, MD  rosuvastatin (CRESTOR) 10 MG tablet Take 1 tablet (10 mg total) by mouth daily. 05/23/21   Martinique, Betty G, MD  sucralfate (CARAFATE) 1 g tablet Take 1 tablet (1 g total) by mouth 4 (four) times daily -  with meals and at bedtime. 06/01/21 08/30/21  Farrel Gordon, DO      Allergies    Cortisone, Eggs or egg-derived products, Ketorolac, Ketorolac tromethamine, Haldol [haloperidol lactate], Haloperidol, Hydrocortisone, Iodinated contrast media, Ketorolac tromethamine, Reglan [metoclopramide], and Compazine [prochlorperazine]  Review of Systems   Review of Systems  Constitutional:  Negative for chills, diaphoresis, fatigue and fever.  HENT:  Negative for congestion, rhinorrhea and sneezing.   Eyes: Negative.   Respiratory:  Negative for cough, chest tightness and shortness of breath.   Cardiovascular:  Negative for chest pain and leg swelling.  Gastrointestinal:  Positive for abdominal pain, nausea and vomiting. Negative for blood in stool and diarrhea.  Genitourinary:  Negative for difficulty urinating, flank pain, frequency and hematuria.  Musculoskeletal:   Negative for arthralgias and back pain.  Skin:  Negative for rash.  Neurological:  Negative for dizziness, speech difficulty, weakness, numbness and headaches.   Physical Exam Updated Vital Signs BP (!) 138/101    Pulse 66    Temp 98.4 F (36.9 C)    Resp 17    Ht 6\' 1"  (1.854 m)    Wt 90.7 kg    SpO2 99%    BMI 26.39 kg/m  Physical Exam Constitutional:      Appearance: He is well-developed.  HENT:     Head: Normocephalic and atraumatic.  Eyes:     Pupils: Pupils are equal, round, and reactive to light.  Cardiovascular:     Rate and Rhythm: Normal rate and regular rhythm.     Heart sounds: Normal heart sounds.  Pulmonary:     Effort: Pulmonary effort is normal. No respiratory distress.     Breath sounds: Normal breath sounds. No wheezing or rales.  Chest:     Chest wall: No tenderness.  Abdominal:     General: Bowel sounds are normal.     Palpations: Abdomen is soft.     Tenderness: There is no abdominal tenderness (Tenderness across the upper abdomen). There is no guarding or rebound.  Musculoskeletal:        General: Normal range of motion.     Cervical back: Normal range of motion and neck supple.  Lymphadenopathy:     Cervical: No cervical adenopathy.  Skin:    General: Skin is warm and dry.     Findings: No rash.  Neurological:     Mental Status: He is alert and oriented to person, place, and time.    ED Results / Procedures / Treatments   Labs (all labs ordered are listed, but only abnormal results are displayed) Labs Reviewed  LIPASE, BLOOD - Abnormal; Notable for the following components:      Result Value   Lipase 69 (*)    All other components within normal limits  COMPREHENSIVE METABOLIC PANEL - Abnormal; Notable for the following components:   Potassium 3.3 (*)    Glucose, Bld 151 (*)    All other components within normal limits  CBC WITH DIFFERENTIAL/PLATELET - Abnormal; Notable for the following components:   WBC 13.0 (*)    Lymphs Abs 4.6 (*)     Monocytes Absolute 1.1 (*)    All other components within normal limits  URINALYSIS, ROUTINE W REFLEX MICROSCOPIC    EKG EKG Interpretation  Date/Time:  Tuesday June 13 2021 11:19:58 EST Ventricular Rate:  74 PR Interval:  160 QRS Duration: 96 QT Interval:  396 QTC Calculation: 440 R Axis:   47 Text Interpretation: Sinus rhythm since last tracing no significant change Confirmed by Malvin Johns [phone]) on 06/13/2021 12:19:15 PM  Radiology No results found.  Procedures Procedures    Medications Ordered in ED Medications  sodium chloride 0.9 % bolus 1,000 mL (0 mLs Intravenous Stopped 06/13/21 1503)  ondansetron (ZOFRAN)  injection 4 mg (4 mg Intravenous Given 06/13/21 1144)  oxyCODONE (Oxy IR/ROXICODONE) immediate release tablet 5 mg (5 mg Oral Given 06/13/21 1417)    ED Course/ Medical Decision Making/ A&P                           Medical Decision Making Amount and/or Complexity of Data Reviewed Labs: ordered.  Risk Prescription drug management.   Patient is a 46 year old male who presents with abdominal pain associated nausea and vomiting.  He has recurrent episodes of this related to chronic pancreatitis.  He is been evaluated by gastroenterology and pain management.  He is in today for similar symptoms and symptomatic treatment.  He was given IV fluids and Zofran.  I was reluctant to give him IV opioids as this is a chronic issue and he has been seen multiple times for similar symptoms in the ED.  He was treated with the antiemetics and IV fluids and felt better.  He took oral oxycodone.  He is tolerating fluids by mouth.  He has no ongoing vomiting.  His labs show a minimally elevated lipase but otherwise nonconcerning.  His white count is mildly elevated but similar to prior values based on chart review.  Given the chronicity of symptoms and the fact that he has had multiple evaluations in the past, I did not feel that further imaging was indicated.  Given his  improvement, hospitalization was considered but not indicated at this time.  He was discharged home in good condition.  He was encouraged to follow-up with his primary care doctor and pain management as scheduled.  Return precautions were given.  Final Clinical Impression(s) / ED Diagnoses Final diagnoses:  Chronic pancreatitis, unspecified pancreatitis type ([place])  Nausea and vomiting, unspecified vomiting type    Rx / DC Orders ED Discharge Orders     None         Malvin Johns, MD 06/13/21 1512

## 2021-06-13 NOTE — ED Triage Notes (Signed)
States 20 episodes of vomiting since Sunday, saw PCP last week, Hx of pancreatitis. Unable to tolerate food/fluids. ULQ pain that radiates to back.

## 2021-06-14 ENCOUNTER — Other Ambulatory Visit: Payer: Self-pay

## 2021-06-14 ENCOUNTER — Encounter (HOSPITAL_BASED_OUTPATIENT_CLINIC_OR_DEPARTMENT_OTHER): Payer: Self-pay

## 2021-06-14 DIAGNOSIS — G8929 Other chronic pain: Secondary | ICD-10-CM | POA: Diagnosis not present

## 2021-06-14 DIAGNOSIS — R1084 Generalized abdominal pain: Secondary | ICD-10-CM | POA: Insufficient documentation

## 2021-06-14 DIAGNOSIS — R112 Nausea with vomiting, unspecified: Secondary | ICD-10-CM | POA: Diagnosis not present

## 2021-06-14 NOTE — ED Triage Notes (Signed)
Pt. States his abdominal area is not getting any better. Was seen and treated last night. States pain is 9/10 pain scale. Pt. States this is a chronic problem.

## 2021-06-15 ENCOUNTER — Encounter (HOSPITAL_BASED_OUTPATIENT_CLINIC_OR_DEPARTMENT_OTHER): Payer: Self-pay | Admitting: Emergency Medicine

## 2021-06-15 ENCOUNTER — Emergency Department (HOSPITAL_BASED_OUTPATIENT_CLINIC_OR_DEPARTMENT_OTHER)
Admission: EM | Admit: 2021-06-15 | Discharge: 2021-06-15 | Disposition: A | Discharge status: Home / Self Care | Payer: No Typology Code available for payment source | Attending: Emergency Medicine | Admitting: Emergency Medicine

## 2021-06-15 DIAGNOSIS — G8929 Other chronic pain: Secondary | ICD-10-CM

## 2021-06-15 LAB — COMPREHENSIVE METABOLIC PANEL
ALT: 28 U/L (ref 0–44)
AST: 21 U/L (ref 15–41)
Albumin: 4.3 g/dL (ref 3.5–5.0)
Alkaline Phosphatase: 70 U/L (ref 38–126)
Anion gap: 10 (ref 5–15)
BUN: 14 mg/dL (ref 6–20)
CO2: 24 mmol/L (ref 22–32)
Calcium: 9.7 mg/dL (ref 8.9–10.3)
Chloride: 106 mmol/L (ref 98–111)
Creatinine, Ser: 0.78 mg/dL (ref 0.61–1.24)
GFR, Estimated: >60 mL/min (ref >60)
Glucose, Bld: 96 mg/dL (ref 70–99)
Potassium: 3.6 mmol/L (ref 3.5–5.1)
Sodium: 140 mmol/L (ref 135–145)
Total Bilirubin: 0.5 mg/dL (ref 0.3–1.2)
Total Protein: 7.1 g/dL (ref 6.5–8.1)

## 2021-06-15 LAB — CBC WITH DIFFERENTIAL/PLATELET
Abs Immature Granulocytes: 0.06 10*3/uL (ref 0.00–0.07)
Basophils Absolute: 0.1 10*3/uL (ref 0.0–0.1)
Basophils Relative: 1 %
Eosinophils Absolute: 0.2 10*3/uL (ref 0.0–0.5)
Eosinophils Relative: 1 %
HCT: 43.7 % (ref 39.0–52.0)
Hemoglobin: 15.5 g/dL (ref 13.0–17.0)
Immature Granulocytes: 0 %
Lymphocytes Relative: 23 %
Lymphs Abs: 3.1 10*3/uL (ref 0.7–4.0)
MCH: 30 pg (ref 26.0–34.0)
MCHC: 35.5 g/dL (ref 30.0–36.0)
MCV: 84.5 fL (ref 80.0–100.0)
Monocytes Absolute: 1 10*3/uL (ref 0.1–1.0)
Monocytes Relative: 8 %
Neutro Abs: 9.2 10*3/uL — ABNORMAL HIGH (ref 1.7–7.7)
Neutrophils Relative %: 67 %
Platelets: 212 10*3/uL (ref 150–400)
RBC: 5.17 MIL/uL (ref 4.22–5.81)
RDW: 12.8 % (ref 11.5–15.5)
WBC: 13.6 10*3/uL — ABNORMAL HIGH (ref 4.0–10.5)
nRBC: 0 % (ref 0.0–0.2)

## 2021-06-15 LAB — LIPASE, BLOOD: Lipase: 15 U/L (ref 11–51)

## 2021-06-15 MED ORDER — DROPERIDOL 2.5 MG/ML IJ SOLN: 1.2500 mg | Freq: Once | INTRAMUSCULAR | Status: DC

## 2021-06-15 MED ORDER — PROMETHAZINE HCL 25 MG/ML IJ SOLN
12.5000 mg | INTRAMUSCULAR | Status: AC
  Administered 2021-06-15: 12.5 mg via INTRAMUSCULAR
  Filled 2021-06-15: qty 1

## 2021-06-15 MED ORDER — POTASSIUM CHLORIDE CRYS ER 20 MEQ PO TBCR
40.0000 meq | EXTENDED_RELEASE_TABLET | Freq: Once | ORAL | Status: DC
  Filled 2021-06-15: qty 2

## 2021-06-15 MED ORDER — OXYCODONE HCL 5 MG PO TABS
5.0000 mg | ORAL_TABLET | ORAL | Status: AC
  Administered 2021-06-15: 5 mg via ORAL
  Filled 2021-06-15: qty 1

## 2021-06-15 NOTE — ED Provider Notes (Signed)
[facility] EMERGENCY DEPT Provider Note   CSN: [id number] Arrival date & time: 06/14/21  2212     History  Chief Complaint  Patient presents with   Abdominal Pain    [name] is a 46 y.o. male.  The history is provided by the patient.  Abdominal Pain Pain location:  Generalized Pain radiates to:  Does not radiate Pain severity:  Severe Onset quality:  Gradual Duration:  1 week Timing:  Constant Progression:  Worsening Chronicity:  Chronic Context: not trauma   Relieved by:  Nothing Worsened by:  Nothing Ineffective treatments:  Vomiting Associated symptoms: nausea and vomiting   Associated symptoms: no anorexia, no diarrhea and no fever   Risk factors: not obese   Patient who is well known to the ED for chronic pancreatitis and abdominal pain who was seen 24 hours ago for same presents with ongoing pain and nausea and emesis.  States he cannot seen pain management until February and is seeing GI next week to discuss further options for his pain.      Home Medications Prior to Admission medications   Medication Sig Start Date End Date Taking? Authorizing Provider  amLODipine (NORVASC) 5 MG tablet Take 1 tablet (5 mg total) by mouth daily. 04/29/21   [place], Betty G, MD  benazepril (LOTENSIN) 40 MG tablet Take 1 tablet (40 mg total) by mouth daily. 04/29/21   [place], Betty G, MD  fluticasone (FLONASE) 50 MCG/ACT nasal spray Place 1 spray into both nostrils 2 (two) times daily. 05/26/21   [place], Betty G, MD  KLOR-CON M20 20 MEQ tablet TAKE 1 TABLET BY MOUTH EVERY DAY 05/30/21   [place], Betty G, MD  metoprolol succinate (TOPROL-XL) 100 MG 24 hr tablet TAKE 1 TABLET BY MOUTH DAILY. TAKE WITH OR IMMEDIATELY FOLLOWING A MEAL. 05/01/21   [place], Betty G, MD  naloxone [facility]) nasal spray 4 mg/0.1 mL 1 spray in each nostril x 1 if opioid overdose. 06/08/20   [place], Betty G, MD  ondansetron (ZOFRAN ODT) 4 MG disintegrating tablet Place 1 tablet under the tongue  every 12 hours PRN nausea 04/03/21   [place], Betty G, MD  oxyCODONE (OXY IR/ROXICODONE) 5 MG immediate release tablet Take 1 tablet (5 mg total) by mouth 2 (two) times daily as needed for up to 10 days for moderate pain or severe pain. 06/09/21 06/19/21  [place], Betty G, MD  pantoprazole (PROTONIX) 40 MG tablet Take 1 tablet (40 mg total) by mouth daily. Patient taking differently: Take 40 mg by mouth daily as needed (heart burn). 12/25/20   Lajean Saver, MD  promethazine (PHENERGAN) 25 MG suppository Place 1 suppository (25 mg total) rectally every 6 (six) hours as needed for nausea or vomiting. 93/57/01   Delora Fuel, MD  promethazine (PHENERGAN) 25 MG tablet Take 1 tablet (25 mg total) by mouth every 6 (six) hours as needed. 77/93/90   Delora Fuel, MD  rosuvastatin (CRESTOR) 10 MG tablet Take 1 tablet (10 mg total) by mouth daily. 05/23/21   [place], Betty G, MD  sucralfate (CARAFATE) 1 g tablet Take 1 tablet (1 g total) by mouth 4 (four) times daily -  with meals and at bedtime. 06/01/21 08/30/21  Farrel Gordon, DO      Allergies    Cortisone, Eggs or egg-derived products, Ketorolac, Ketorolac tromethamine, Haldol [haloperidol lactate], Haloperidol, Hydrocortisone, Iodinated contrast media, Ketorolac tromethamine, Reglan [metoclopramide], Compazine [prochlorperazine], and Droperidol    Review of Systems   Review of Systems  Constitutional:  Negative for fever.  HENT:  Negative for congestion.   Eyes:  Negative for redness.  Respiratory:  Negative for wheezing.   Gastrointestinal:  Positive for abdominal pain, nausea and vomiting. Negative for anorexia and diarrhea.  Genitourinary:  Negative for difficulty urinating.  Musculoskeletal:  Negative for neck stiffness.  Skin:  Negative for rash.  Neurological:  Negative for facial asymmetry.  Psychiatric/Behavioral:  Negative for agitation.   All other systems reviewed and are negative.  Physical Exam Updated Vital Signs BP (!) 143/107 (BP  Location: Right Arm)    Pulse (!) 107    Temp 98.5 F (36.9 C) (Oral)    Resp 18    Ht 6\' 1"  (1.854 m)    Wt 90.7 kg    SpO2 99%    BMI 26.39 kg/m  Physical Exam Vitals and nursing note reviewed.  Constitutional:      General: He is not in acute distress.    Appearance: Normal appearance. He is well-developed.  HENT:     Head: Normocephalic and atraumatic.     Nose: Nose normal.  Eyes:     Conjunctiva/sclera: Conjunctivae normal.     Pupils: Pupils are equal, round, and reactive to light.  Cardiovascular:     Rate and Rhythm: Normal rate and regular rhythm.     Pulses: Normal pulses.     Heart sounds: Normal heart sounds.  Pulmonary:     Effort: Pulmonary effort is normal.     Breath sounds: Normal breath sounds.  Abdominal:     General: Abdomen is flat. Bowel sounds are normal.     Palpations: Abdomen is soft.     Tenderness: There is no abdominal tenderness. There is no guarding or rebound.     Hernia: No hernia is present.  Musculoskeletal:        General: Normal range of motion.     Cervical back: Normal range of motion and neck supple.  Skin:    General: Skin is warm and dry.     Capillary Refill: Capillary refill takes less than 2 seconds.  Neurological:     General: No focal deficit present.     Mental Status: He is alert.     Deep Tendon Reflexes: Reflexes normal.  Psychiatric:        Mood and Affect: Mood normal.    ED Results / Procedures / Treatments   Labs (all labs ordered are listed, but only abnormal results are displayed) Results for orders placed or performed during the hospital encounter of 06/15/21  CBC with Differential/Platelet  Result Value Ref Range   WBC 13.6 (H) 4.0 - 10.5 K/uL   RBC 5.17 4.22 - 5.81 MIL/uL   Hemoglobin 15.5 13.0 - 17.0 g/dL   HCT 43.7 39.0 - 52.0 %   MCV 84.5 80.0 - 100.0 fL   MCH 30.0 26.0 - 34.0 pg   MCHC 35.5 30.0 - 36.0 g/dL   RDW 12.8 11.5 - 15.5 %   Platelets 212 150 - 400 K/uL   nRBC 0.0 0.0 - 0.2 %    Neutrophils Relative % 67 %   Neutro Abs 9.2 (H) 1.7 - 7.7 K/uL   Lymphocytes Relative 23 %   Lymphs Abs 3.1 0.7 - 4.0 K/uL   Monocytes Relative 8 %   Monocytes Absolute 1.0 0.1 - 1.0 K/uL   Eosinophils Relative 1 %   Eosinophils Absolute 0.2 0.0 - 0.5 K/uL   Basophils Relative 1 %   Basophils Absolute 0.1 0.0 -  0.1 K/uL   Immature Granulocytes 0 %   Abs Immature Granulocytes 0.06 0.00 - 0.07 K/uL  Comprehensive metabolic panel  Result Value Ref Range   Sodium 140 135 - 145 mmol/L   Potassium 3.6 3.5 - 5.1 mmol/L   Chloride 106 98 - 111 mmol/L   CO2 24 22 - 32 mmol/L   Glucose, Bld 96 70 - 99 mg/dL   BUN 14 6 - 20 mg/dL   Creatinine, Ser 0.78 0.61 - 1.24 mg/dL   Calcium 9.7 8.9 - 10.3 mg/dL   Total Protein 7.1 6.5 - 8.1 g/dL   Albumin 4.3 3.5 - 5.0 g/dL   AST 21 15 - 41 U/L   ALT 28 0 - 44 U/L   Alkaline Phosphatase 70 38 - 126 U/L   Total Bilirubin 0.5 0.3 - 1.2 mg/dL   GFR, Estimated >60 >60 mL/min   Anion gap 10 5 - 15  Lipase, blood  Result Value Ref Range   Lipase 15 11 - 51 U/L   CT Abdomen Pelvis Wo Contrast  Result Date: 06/07/2021 CLINICAL DATA:  Abdominal pain, acute, nonlocalized; history of chronic pancreatitis, abdominal pain with nausea/vomiting/diarrhea EXAM: CT ABDOMEN AND PELVIS WITHOUT CONTRAST TECHNIQUE: Multidetector CT imaging of the abdomen and pelvis was performed following the standard protocol without IV contrast. RADIATION DOSE REDUCTION: This exam was performed according to the departmental dose-optimization program which includes automated exposure control, adjustment of the mA and/or kV according to patient size and/or use of iterative reconstruction technique. COMPARISON:  06/01/2021 FINDINGS: Lower chest: Bibasilar atelectasis/scarring. Hepatobiliary: No focal liver abnormality. Persistent pneumobilia presumably due to prior sphincterotomy. Dilatation of the bile ducts with tapering of the common duct likely related to post cholecystectomy state.  Pancreas: Unremarkable. No surrounding inflammatory changes or ductal dilatation. Spleen: Unremarkable. Adrenals/Urinary Tract: Adrenals are unremarkable. Kidneys and bladder are unremarkable. Stomach/Bowel: Stomach is within normal limits. Bowel is normal in caliber. Appendix appears to be surgically absent. Vascular/Lymphatic: Mild calcified plaque.  No enlarged nodes. Reproductive: Similar appearance of prostate calcifications. Other: No free fluid.  Abdominal wall is unremarkable. Musculoskeletal: No acute abnormality. IMPRESSION: No acute abnormality or findings to account for reported symptoms. Electronically Signed   By: Macy Mis M.D.   On: 06/07/2021 10:02   CT ABDOMEN PELVIS WO CONTRAST  Result Date: 06/01/2021 CLINICAL DATA:  Abdominal pain EXAM: CT ABDOMEN AND PELVIS WITHOUT CONTRAST TECHNIQUE: Multidetector CT imaging of the abdomen and pelvis was performed following the standard protocol without IV contrast. COMPARISON:  05/04/2021 FINDINGS: Lower chest: There are small linear densities in the lower lung fields, more so on the left side suggesting minimal subsegmental atelectasis. Hepatobiliary: Surgical clips are seen in gallbladder fossa. There is air in the bile ducts, possibly due to previous sphincterotomy Pancreas: No focal abnormality is seen. Spleen: Unremarkable. Adrenals/Urinary Tract: Adrenals are unremarkable. There is no hydronephrosis. There are no renal or ureteral stones. Urinary bladder is unremarkable. Stomach/Bowel: Stomach is unremarkable. Small bowel loops are not dilated. Appendix is not seen. There is no pericecal inflammation. There is incomplete distention of colon. There is no significant focal bowel wall thickening. There is no pericolic stranding or fluid collection. Vascular/Lymphatic: Unremarkable. Reproductive: Coarse calcifications are seen in the prostate. Other: There is no ascites or pneumoperitoneum. Musculoskeletal: Unremarkable. IMPRESSION: No acute  findings are seen in noncontrast CT of abdomen and pelvis. No significant interval changes are noted. Electronically Signed   By: Elmer Picker M.D.   On: 06/01/2021 10:01  Radiology No results found.  Procedures Procedures    Medications Ordered in ED Medications  potassium chloride SA (KLOR-CON M) CR tablet 40 mEq (40 mEq Oral Patient Refused/Not Given 06/15/21 0118)  droperidol (INAPSINE) 2.5 MG/ML injection 1.25 mg (1.25 mg Intramuscular Patient Refused/Not Given 06/15/21 0104)  oxyCODONE (Oxy IR/ROXICODONE) immediate release tablet 5 mg (5 mg Oral Given 06/15/21 0118)  promethazine (PHENERGAN) injection 12.5 mg (12.5 mg Intramuscular Given 06/15/21 0137)    ED Course/ Medical Decision Making/ A&P                           Medical Decision Making Amount and/or Complexity of Data Reviewed External Data Reviewed: labs, radiology and notes. Labs: ordered. Decision-making details documented in ED Course.    Details: Lipase is normal.  white count is above the top limit of normal but is average for this patient and same for the last month.  Risk Prescription drug management. Risk Details: This patient has been seen within the last 24 hours and has been given medication and IVF.  There are no signs of a surgical abdomen nor infectious cause of the patient's symptoms.  Despite the fact the patient states he can't control vomiting, there was none in the waiting room for the nearly 3 hours he waiting and none in the exam room.  Patient declines all medications except narcotics.  There are no non-verbal cues of pain. I do not believe this patient requires IV narcotics.  I have advised the patient to follow up with GI and his pain management specialist for ongoing care.      Final Clinical Impression(s) / ED Diagnoses Final diagnoses:  Other chronic pain   Return for intractable cough, coughing up blood, fevers > 100.4 unrelieved by medication, shortness of breath, intractable  vomiting, chest pain, shortness of breath, weakness, numbness, changes in speech, facial asymmetry, abdominal pain, passing out, Inability to tolerate liquids or food, cough, altered mental status or any concerns. No signs of systemic illness or infection. The patient is nontoxic-appearing on exam and vital signs are within normal limits.  I have reviewed the triage vital signs and the nursing notes. Pertinent labs & imaging results that were available during my care of the patient were reviewed by me and considered in my medical decision making (see chart for details). After history, exam, and medical workup I feel the patient has been appropriately medically screened and is safe for discharge home. Pertinent diagnoses were discussed with the patient. Patient was given return precautions.    Rx / DC Orders ED Discharge Orders     None         Shakerria Parran, MD 06/15/21 [phone]

## 2021-06-16 ENCOUNTER — Emergency Department (HOSPITAL_BASED_OUTPATIENT_CLINIC_OR_DEPARTMENT_OTHER)
Admission: EM | Admit: 2021-06-16 | Discharge: 2021-06-16 | Disposition: A | Discharge status: Home / Self Care | Payer: No Typology Code available for payment source | Attending: Emergency Medicine | Admitting: Emergency Medicine

## 2021-06-16 ENCOUNTER — Other Ambulatory Visit: Payer: Self-pay

## 2021-06-16 ENCOUNTER — Encounter (HOSPITAL_BASED_OUTPATIENT_CLINIC_OR_DEPARTMENT_OTHER): Payer: Self-pay

## 2021-06-16 DIAGNOSIS — R112 Nausea with vomiting, unspecified: Secondary | ICD-10-CM | POA: Insufficient documentation

## 2021-06-16 DIAGNOSIS — G8929 Other chronic pain: Secondary | ICD-10-CM | POA: Insufficient documentation

## 2021-06-16 DIAGNOSIS — R1084 Generalized abdominal pain: Secondary | ICD-10-CM | POA: Insufficient documentation

## 2021-06-16 LAB — CBC WITH DIFFERENTIAL/PLATELET
Abs Immature Granulocytes: 0.05 10*3/uL (ref 0.00–0.07)
Basophils Absolute: 0.1 10*3/uL (ref 0.0–0.1)
Basophils Relative: 1 %
Eosinophils Absolute: 0.3 10*3/uL (ref 0.0–0.5)
Eosinophils Relative: 2 %
HCT: 47.1 % (ref 39.0–52.0)
Hemoglobin: 17 g/dL (ref 13.0–17.0)
Immature Granulocytes: 0 %
Lymphocytes Relative: 27 %
Lymphs Abs: 3.6 10*3/uL (ref 0.7–4.0)
MCH: 30.3 pg (ref 26.0–34.0)
MCHC: 36.1 g/dL — ABNORMAL HIGH (ref 30.0–36.0)
MCV: 84 fL (ref 80.0–100.0)
Monocytes Absolute: 1.2 10*3/uL — ABNORMAL HIGH (ref 0.1–1.0)
Monocytes Relative: 9 %
Neutro Abs: 8 10*3/uL — ABNORMAL HIGH (ref 1.7–7.7)
Neutrophils Relative %: 61 %
Platelets: 228 10*3/uL (ref 150–400)
RBC: 5.61 MIL/uL (ref 4.22–5.81)
RDW: 12.8 % (ref 11.5–15.5)
WBC: 13.2 10*3/uL — ABNORMAL HIGH (ref 4.0–10.5)
nRBC: 0 % (ref 0.0–0.2)

## 2021-06-16 LAB — COMPREHENSIVE METABOLIC PANEL
ALT: 33 U/L (ref 0–44)
AST: 30 U/L (ref 15–41)
Albumin: 4.2 g/dL (ref 3.5–5.0)
Alkaline Phosphatase: 76 U/L (ref 38–126)
Anion gap: 10 (ref 5–15)
BUN: 14 mg/dL (ref 6–20)
CO2: 23 mmol/L (ref 22–32)
Calcium: 9.2 mg/dL (ref 8.9–10.3)
Chloride: 108 mmol/L (ref 98–111)
Creatinine, Ser: 0.95 mg/dL (ref 0.61–1.24)
GFR, Estimated: >60 mL/min (ref >60)
Glucose, Bld: 121 mg/dL — ABNORMAL HIGH (ref 70–99)
Potassium: 3.4 mmol/L — ABNORMAL LOW (ref 3.5–5.1)
Sodium: 141 mmol/L (ref 135–145)
Total Bilirubin: 0.7 mg/dL (ref 0.3–1.2)
Total Protein: 7.3 g/dL (ref 6.5–8.1)

## 2021-06-16 LAB — LIPASE, BLOOD: Lipase: 31 U/L (ref 11–51)

## 2021-06-16 MED ORDER — HYDROMORPHONE HCL 1 MG/ML IJ SOLN
1.0000 mg | Freq: Once | INTRAMUSCULAR | Status: AC
  Administered 2021-06-16: 1 mg via INTRAVENOUS
  Filled 2021-06-16: qty 1

## 2021-06-16 MED ORDER — ONDANSETRON 4 MG PO TBDP: ORAL_TABLET | ORAL | 0 refills | Status: DC

## 2021-06-16 MED ORDER — SODIUM CHLORIDE 0.9 % IV BOLUS
1000.0000 mL | Freq: Once | INTRAVENOUS | Status: AC
  Administered 2021-06-16: 1000 mL via INTRAVENOUS

## 2021-06-16 MED ORDER — ONDANSETRON HCL 4 MG/2ML IJ SOLN
4.0000 mg | Freq: Once | INTRAMUSCULAR | Status: AC
  Administered 2021-06-16: 4 mg via INTRAVENOUS
  Filled 2021-06-16: qty 2

## 2021-06-16 NOTE — ED Triage Notes (Signed)
Pt reports his abdominal pain is not any better. He continues to have pain and  last time he vomited was 30 minutes.

## 2021-06-16 NOTE — Discharge Instructions (Signed)
You were seen in the emergency department for worsening of your abdominal pain along with nausea and vomiting.  Your lab work was unremarkable.  You received IV fluids nausea medication and narcotics with improvement in your symptoms.  Please follow-up with your GI doctor and your pain specialist.  Continue your regular medications.

## 2021-06-16 NOTE — ED Provider Notes (Signed)
[facility] EMERGENCY DEPARTMENT Provider Note   CSN: [id number] Arrival date & time: 06/16/21  0747     History  Chief Complaint  Patient presents with   Abdominal Pain    [name] is a 46 y.o. male.  He has a history of chronic abdominal pain.  Complaining of abdominal pain worsened over the last week.  He has had multiple ED visits for same.  Work-ups have been unremarkable.  He is supposed to see general surgery, GI, pain clinic but these appointments are coming up in the next month.  Taking oxycodone without improvement.  Denies any fevers.  No hematemesis.  The history is provided by the patient.  Abdominal Pain Pain location:  Generalized Pain quality: aching   Pain radiation: Back. Pain severity:  Severe Onset quality:  Gradual Duration:  1 week Timing:  Constant Progression:  Unchanged Chronicity:  Recurrent Context: not alcohol use and not trauma   Relieved by:  Nothing Worsened by:  Nothing Ineffective treatments: Narcotics. Associated symptoms: nausea and vomiting   Associated symptoms: no chest pain, no cough, no diarrhea, no dysuria, no fever, no shortness of breath and no sore throat   Risk factors: recent hospitalization       Home Medications Prior to Admission medications   Medication Sig Start Date End Date Taking? Authorizing Provider  amLODipine (NORVASC) 5 MG tablet Take 1 tablet (5 mg total) by mouth daily. 04/29/21   [place], Betty G, MD  benazepril (LOTENSIN) 40 MG tablet Take 1 tablet (40 mg total) by mouth daily. 04/29/21   [place], Betty G, MD  fluticasone (FLONASE) 50 MCG/ACT nasal spray Place 1 spray into both nostrils 2 (two) times daily. 05/26/21   [place], Betty G, MD  KLOR-CON M20 20 MEQ tablet TAKE 1 TABLET BY MOUTH EVERY DAY 05/30/21   [place], Betty G, MD  metoprolol succinate (TOPROL-XL) 100 MG 24 hr tablet TAKE 1 TABLET BY MOUTH DAILY. TAKE WITH OR IMMEDIATELY FOLLOWING A MEAL. 05/01/21   [place], Betty G, MD  naloxone  [facility]) nasal spray 4 mg/0.1 mL 1 spray in each nostril x 1 if opioid overdose. 06/08/20   [place], Betty G, MD  ondansetron (ZOFRAN ODT) 4 MG disintegrating tablet Place 1 tablet under the tongue every 12 hours PRN nausea 04/03/21   [place], Betty G, MD  oxyCODONE (OXY IR/ROXICODONE) 5 MG immediate release tablet Take 1 tablet (5 mg total) by mouth 2 (two) times daily as needed for up to 10 days for moderate pain or severe pain. 06/09/21 06/19/21  [place], Betty G, MD  pantoprazole (PROTONIX) 40 MG tablet Take 1 tablet (40 mg total) by mouth daily. Patient taking differently: Take 40 mg by mouth daily as needed (heart burn). 12/25/20   Lajean Saver, MD  promethazine (PHENERGAN) 25 MG suppository Place 1 suppository (25 mg total) rectally every 6 (six) hours as needed for nausea or vomiting. 92/42/68   Delora Fuel, MD  promethazine (PHENERGAN) 25 MG tablet Take 1 tablet (25 mg total) by mouth every 6 (six) hours as needed. 34/19/62   Delora Fuel, MD  rosuvastatin (CRESTOR) 10 MG tablet Take 1 tablet (10 mg total) by mouth daily. 05/23/21   [place], Betty G, MD  sucralfate (CARAFATE) 1 g tablet Take 1 tablet (1 g total) by mouth 4 (four) times daily -  with meals and at bedtime. 06/01/21 08/30/21  Farrel Gordon, DO      Allergies    Cortisone, Eggs or egg-derived products, Ketorolac, Ketorolac  tromethamine, Haldol [haloperidol lactate], Haloperidol, Hydrocortisone, Iodinated contrast media, Ketorolac tromethamine, Reglan [metoclopramide], Compazine [prochlorperazine], and Droperidol    Review of Systems   Review of Systems  Constitutional:  Negative for fever.  HENT:  Negative for sore throat.   Respiratory:  Negative for cough and shortness of breath.   Cardiovascular:  Negative for chest pain.  Gastrointestinal:  Positive for abdominal pain, nausea and vomiting. Negative for diarrhea.  Genitourinary:  Negative for dysuria.  Musculoskeletal:  Negative for neck pain.  Skin:  Negative for rash.   Neurological:  Negative for headaches.   Physical Exam Updated Vital Signs BP (!) 156/113 (BP Location: Right Arm)    Pulse 95    Temp 98.2 F (36.8 C) (Oral)    Resp 18    Wt 90.7 kg    SpO2 99%    BMI 26.39 kg/m  Physical Exam Vitals and nursing note reviewed.  Constitutional:      General: He is not in acute distress.    Appearance: He is well-developed.  HENT:     Head: Normocephalic and atraumatic.  Eyes:     Conjunctiva/sclera: Conjunctivae normal.  Cardiovascular:     Rate and Rhythm: Normal rate and regular rhythm.     Heart sounds: No murmur heard. Pulmonary:     Effort: Pulmonary effort is normal. No respiratory distress.     Breath sounds: Normal breath sounds.  Abdominal:     Palpations: Abdomen is soft.     Tenderness: There is no abdominal tenderness. There is no guarding or rebound.  Musculoskeletal:        General: No swelling.     Cervical back: Neck supple.  Skin:    General: Skin is warm and dry.     Capillary Refill: Capillary refill takes less than 2 seconds.  Neurological:     General: No focal deficit present.     Mental Status: He is alert.    ED Results / Procedures / Treatments   Labs (all labs ordered are listed, but only abnormal results are displayed) Labs Reviewed  CBC WITH DIFFERENTIAL/PLATELET - Abnormal; Notable for the following components:      Result Value   WBC 13.2 (*)    MCHC 36.1 (*)    Neutro Abs 8.0 (*)    Monocytes Absolute 1.2 (*)    All other components within normal limits  COMPREHENSIVE METABOLIC PANEL - Abnormal; Notable for the following components:   Potassium 3.4 (*)    Glucose, Bld 121 (*)    All other components within normal limits  LIPASE, BLOOD    EKG None  Radiology No results found.  Procedures Procedures    Medications Ordered in ED Medications  sodium chloride 0.9 % bolus 1,000 mL (has no administration in time range)  ondansetron (ZOFRAN) injection 4 mg (has no administration in time  range)  HYDROmorphone (DILAUDID) injection 1 mg (has no administration in time range)    ED Course/ Medical Decision Making/ A&P                           Medical Decision Making Amount and/or Complexity of Data Reviewed Labs: ordered.  Risk Prescription drug management.  This patient complains of abdominal pain nausea vomiting; this involves an extensive number of treatment Options and is a complaint that carries with it a high risk of complications and Morbidity. The differential includes chronic pain, gastritis, pancreatitis, obstruction, peptic ulcer disease, metabolic derangement  I ordered, reviewed and interpreted labs, which included CBC with elevated white count similar to priors, normal hemoglobin, chemistries fairly normal other than mildly low potassium, normal LFTs and lipase I ordered medication IV fluids nausea medication pain medication with improvement in the symptoms Previous records obtained and reviewed in epic, multiple ED visits for similar presentations.  I have concerns for narcotic dependence.  After the interventions stated above, I reevaluated the patient and found patient symptoms to be improved and tolerating p.o.  Recommended close follow-up with his treating providers.          Final Clinical Impression(s) / ED Diagnoses Final diagnoses:  Chronic abdominal pain  Nausea and vomiting, unspecified vomiting type    Rx / DC Orders ED Discharge Orders     None         Hayden Rasmussen, MD 06/16/21 1924

## 2021-06-16 NOTE — ED Notes (Signed)
States has abd pain since Sunday, Nausea, vomiting, unable to keep POs down at this time.

## 2021-07-01 ENCOUNTER — Other Ambulatory Visit: Payer: Self-pay

## 2021-07-01 ENCOUNTER — Other Ambulatory Visit: Payer: Self-pay | Admitting: Family Medicine

## 2021-07-01 ENCOUNTER — Encounter (HOSPITAL_BASED_OUTPATIENT_CLINIC_OR_DEPARTMENT_OTHER): Payer: Self-pay

## 2021-07-01 ENCOUNTER — Emergency Department (HOSPITAL_BASED_OUTPATIENT_CLINIC_OR_DEPARTMENT_OTHER)
Admission: EM | Admit: 2021-07-01 | Discharge: 2021-07-01 | Disposition: A | Discharge status: Home / Self Care | Payer: No Typology Code available for payment source | Attending: Emergency Medicine | Admitting: Emergency Medicine

## 2021-07-01 DIAGNOSIS — R112 Nausea with vomiting, unspecified: Secondary | ICD-10-CM | POA: Insufficient documentation

## 2021-07-01 DIAGNOSIS — Z79899 Other long term (current) drug therapy: Secondary | ICD-10-CM | POA: Insufficient documentation

## 2021-07-01 DIAGNOSIS — G8929 Other chronic pain: Secondary | ICD-10-CM | POA: Insufficient documentation

## 2021-07-01 DIAGNOSIS — R1011 Right upper quadrant pain: Secondary | ICD-10-CM | POA: Insufficient documentation

## 2021-07-01 DIAGNOSIS — F17210 Nicotine dependence, cigarettes, uncomplicated: Secondary | ICD-10-CM | POA: Diagnosis not present

## 2021-07-01 DIAGNOSIS — I10 Essential (primary) hypertension: Secondary | ICD-10-CM | POA: Insufficient documentation

## 2021-07-01 DIAGNOSIS — E876 Hypokalemia: Secondary | ICD-10-CM

## 2021-07-01 DIAGNOSIS — R109 Unspecified abdominal pain: Secondary | ICD-10-CM

## 2021-07-01 LAB — URINALYSIS, ROUTINE W REFLEX MICROSCOPIC
Bilirubin Urine: NEGATIVE
Glucose, UA: NEGATIVE mg/dL
Ketones, ur: NEGATIVE mg/dL
Leukocytes,Ua: NEGATIVE
Nitrite: NEGATIVE
Protein, ur: NEGATIVE mg/dL
Specific Gravity, Urine: >=1.03 (ref 1.005–1.030)
pH: 5 (ref 5.0–8.0)

## 2021-07-01 LAB — CBC WITH DIFFERENTIAL/PLATELET
Abs Immature Granulocytes: 0.19 10*3/uL — ABNORMAL HIGH (ref 0.00–0.07)
Basophils Absolute: 0 10*3/uL (ref 0.0–0.1)
Basophils Relative: 0 %
Eosinophils Absolute: 0.3 10*3/uL (ref 0.0–0.5)
Eosinophils Relative: 3 %
HCT: 41.5 % (ref 39.0–52.0)
Hemoglobin: 15 g/dL (ref 13.0–17.0)
Immature Granulocytes: 2 %
Lymphocytes Relative: 29 %
Lymphs Abs: 3.4 10*3/uL (ref 0.7–4.0)
MCH: 30.5 pg (ref 26.0–34.0)
MCHC: 36.1 g/dL — ABNORMAL HIGH (ref 30.0–36.0)
MCV: 84.5 fL (ref 80.0–100.0)
Monocytes Absolute: 1.1 10*3/uL — ABNORMAL HIGH (ref 0.1–1.0)
Monocytes Relative: 9 %
Neutro Abs: 6.7 10*3/uL (ref 1.7–7.7)
Neutrophils Relative %: 57 %
Platelets: 237 10*3/uL (ref 150–400)
RBC: 4.91 MIL/uL (ref 4.22–5.81)
RDW: 12.8 % (ref 11.5–15.5)
WBC: 11.7 10*3/uL — ABNORMAL HIGH (ref 4.0–10.5)
nRBC: 0 % (ref 0.0–0.2)

## 2021-07-01 LAB — COMPREHENSIVE METABOLIC PANEL
ALT: 29 U/L (ref 0–44)
AST: 25 U/L (ref 15–41)
Albumin: 4.1 g/dL (ref 3.5–5.0)
Alkaline Phosphatase: 85 U/L (ref 38–126)
Anion gap: 11 (ref 5–15)
BUN: 19 mg/dL (ref 6–20)
CO2: 21 mmol/L — ABNORMAL LOW (ref 22–32)
Calcium: 9.4 mg/dL (ref 8.9–10.3)
Chloride: 107 mmol/L (ref 98–111)
Creatinine, Ser: 0.78 mg/dL (ref 0.61–1.24)
GFR, Estimated: >60 mL/min (ref >60)
Glucose, Bld: 114 mg/dL — ABNORMAL HIGH (ref 70–99)
Potassium: 3.5 mmol/L (ref 3.5–5.1)
Sodium: 139 mmol/L (ref 135–145)
Total Bilirubin: 0.5 mg/dL (ref 0.3–1.2)
Total Protein: 7.2 g/dL (ref 6.5–8.1)

## 2021-07-01 LAB — LIPASE, BLOOD: Lipase: 46 U/L (ref 11–51)

## 2021-07-01 LAB — URINALYSIS, MICROSCOPIC (REFLEX)

## 2021-07-01 MED ORDER — PROMETHAZINE HCL 25 MG/ML IJ SOLN
INTRAMUSCULAR | Status: AC
  Filled 2021-07-01: qty 1

## 2021-07-01 MED ORDER — HYDROMORPHONE HCL 1 MG/ML IJ SOLN
1.0000 mg | INTRAMUSCULAR | Status: AC | PRN
  Administered 2021-07-01 (×2): 1 mg via INTRAVENOUS
  Filled 2021-07-01 (×2): qty 1

## 2021-07-01 MED ORDER — SODIUM CHLORIDE 0.9 % IV BOLUS
1000.0000 mL | Freq: Once | INTRAVENOUS | Status: AC
  Administered 2021-07-01: 1000 mL via INTRAVENOUS

## 2021-07-01 MED ORDER — SODIUM CHLORIDE 0.9 % IV SOLN
25.0000 mg | Freq: Once | INTRAVENOUS | Status: AC
  Administered 2021-07-01: 25 mg via INTRAVENOUS
  Filled 2021-07-01: qty 1

## 2021-07-01 NOTE — ED Provider Notes (Signed)
[facility] Provider Note: Georgena Spurling, MD, FACEP  CSN: [id number] MRN: [id number] ARRIVAL: 07/01/21 at [place]: [place]  Abdominal Pain   HISTORY OF PRESENT ILLNESS  07/01/21 2:54 AM [name] is a 46 y.o. male with a history of chronic abdominal pain (believed to be due to chronic pancreatitis) and frequent visits to the ED for same.  He is here with 2 days of abdominal pain which is located in his right upper quadrant.  This is atypical as his pain is usually epigastric.  He is status postcholecystectomy and appendectomy.  He rates his pain currently as an 8 out of 10.  It is somewhat worse with movement or palpation.  He has had 2 days of nausea and vomiting with this which has prevented him from being able to hold down his home pain medications (oxycodone).   Past Medical History:  Diagnosis Date   Chronic abdominal pain    Diverticulitis    Drug-seeking behavior    Hypertension    Kidney stones    Pancreatitis, chronic ([place]) 05/28/2005    Past Surgical History:  Procedure Laterality Date   APPENDECTOMY     CHOLECYSTECTOMY     ERCP N/A 12/30/2019   Procedure: ENDOSCOPIC RETROGRADE CHOLANGIOPANCREATOGRAPHY (ERCP);  Surgeon: Milus Banister, MD;  Location: [facility];  Service: Endoscopy;  Laterality: N/A;   ERCP N/A 03/04/2021   Procedure: ENDOSCOPIC RETROGRADE CHOLANGIOPANCREATOGRAPHY (ERCP);  Surgeon: Carol Ada, MD;  Location: [facility];  Service: Endoscopy;  Laterality: N/A;   ERCP W/ METAL STENT PLACEMENT     KIDNEY STONE SURGERY     REMOVAL OF STONES  12/30/2019   Procedure: REMOVAL OF STONES;  Surgeon: Milus Banister, MD;  Location: [facility];  Service: Endoscopy;;   REMOVAL OF STONES  03/04/2021   Procedure: REMOVAL OF STONES;  Surgeon: Carol Ada, MD;  Location: [facility];  Service: Endoscopy;;   SPHINCTEROTOMY  03/04/2021   Procedure: Joan Mayans;  Surgeon: Carol Ada, MD;  Location: [facility];   Service: Endoscopy;;    Family History  Problem Relation Age of Onset   Breast cancer Mother    Early death Mother    Hypertension Mother    Heart failure Father    Hypertension Father    Heart disease Father    Hyperlipidemia Father    Pancreatic cancer Paternal Grandmother        possibly   Pancreatic cancer Paternal Aunt    Colon cancer Neg Hx     Social History   Tobacco Use   Smoking status: Every Day    Packs/day: 0.50    Types: Cigarettes   Smokeless tobacco: Never  Vaping Use   Vaping Use: Never used  Substance Use Topics   Alcohol use: Not Currently    Comment: rarely   Drug use: No    Prior to Admission medications   Medication Sig Start Date End Date Taking? Authorizing Provider  amLODipine (NORVASC) 5 MG tablet Take 1 tablet (5 mg total) by mouth daily. 04/29/21   [place], Betty G, MD  benazepril (LOTENSIN) 40 MG tablet Take 1 tablet (40 mg total) by mouth daily. 04/29/21   [place], Betty G, MD  fluticasone (FLONASE) 50 MCG/ACT nasal spray Place 1 spray into both nostrils 2 (two) times daily. 05/26/21   [place], Betty G, MD  KLOR-CON M20 20 MEQ tablet TAKE 1 TABLET BY MOUTH EVERY DAY 05/30/21   [place], Betty G, MD  metoprolol succinate (  TOPROL-XL) 100 MG 24 hr tablet TAKE 1 TABLET BY MOUTH DAILY. TAKE WITH OR IMMEDIATELY FOLLOWING A MEAL. 05/01/21   [place], Betty G, MD  naloxone [place]) nasal spray 4 mg/0.1 mL 1 spray in each nostril x 1 if opioid overdose. 06/08/20   [place], Betty G, MD  ondansetron (ZOFRAN ODT) 4 MG disintegrating tablet Place 1 tablet under the tongue every 12 hours PRN nausea 06/16/21   Hayden Rasmussen, MD  pantoprazole (PROTONIX) 40 MG tablet Take 1 tablet (40 mg total) by mouth daily. Patient taking differently: Take 40 mg by mouth daily as needed (heart burn). 12/25/20   Lajean Saver, MD  promethazine (PHENERGAN) 25 MG suppository Place 1 suppository (25 mg total) rectally every 6 (six) hours as needed for nausea or vomiting. 72/09/47   Delora Fuel, MD  promethazine (PHENERGAN) 25 MG tablet Take 1 tablet (25 mg total) by mouth every 6 (six) hours as needed. 09/62/83   Delora Fuel, MD  rosuvastatin (CRESTOR) 10 MG tablet Take 1 tablet (10 mg total) by mouth daily. 05/23/21   [place], Betty G, MD  sucralfate (CARAFATE) 1 g tablet Take 1 tablet (1 g total) by mouth 4 (four) times daily -  with meals and at bedtime. 06/01/21 08/30/21  Farrel Gordon, DO    Allergies Cortisone, Eggs or egg-derived products, Ketorolac, Ketorolac tromethamine, Haldol [haloperidol lactate], Haloperidol, Hydrocortisone, Iodinated contrast media, Ketorolac tromethamine, Reglan [metoclopramide], Compazine [prochlorperazine], and Droperidol   REVIEW OF SYSTEMS  Negative except as noted here or in the History of Present Illness.   PHYSICAL EXAMINATION  Initial Vital Signs Blood pressure (!) 173/111, pulse (!) 110, temperature 98 F (36.7 C), temperature source Oral, resp. rate 18, height 6\' 1"  (1.854 m), weight 90.7 kg, SpO2 99 %.  Examination General: Well-developed, well-nourished male in no acute distress; appearance consistent with age of record HENT: normocephalic; atraumatic Eyes: Normal appearance Neck: supple Heart: regular rate and rhythm; tachycardia Lungs: clear to auscultation bilaterally Abdomen: soft; nondistended; mild right upper quadrant tenderness; no masses or hepatosplenomegaly; bowel sounds present Extremities: No deformity; full range of motion; pulses normal Neurologic: Awake, alert and oriented; motor function intact in all extremities and symmetric; no facial droop Skin: Warm and dry Psychiatric: Normal mood and affect   RESULTS  Summary of this visit's results, reviewed and interpreted by myself:   EKG Interpretation  Date/Time:    Ventricular Rate:    PR Interval:    QRS Duration:   QT Interval:    QTC Calculation:   R Axis:     Text Interpretation:         Laboratory Studies: Results for orders placed or  performed during the hospital encounter of 07/01/21 (from the past 24 hour(s))  CBC with Differential/Platelet     Status: Abnormal   Collection Time: 07/01/21  2:55 AM  Result Value Ref Range   WBC 11.7 (H) 4.0 - 10.5 K/uL   RBC 4.91 4.22 - 5.81 MIL/uL   Hemoglobin 15.0 13.0 - 17.0 g/dL   HCT 41.5 39.0 - 52.0 %   MCV 84.5 80.0 - 100.0 fL   MCH 30.5 26.0 - 34.0 pg   MCHC 36.1 (H) 30.0 - 36.0 g/dL   RDW 12.8 11.5 - 15.5 %   Platelets 237 150 - 400 K/uL   nRBC 0.0 0.0 - 0.2 %   Neutrophils Relative % 57 %   Neutro Abs 6.7 1.7 - 7.7 K/uL   Lymphocytes Relative 29 %   Lymphs Abs  3.4 0.7 - 4.0 K/uL   Monocytes Relative 9 %   Monocytes Absolute 1.1 (H) 0.1 - 1.0 K/uL   Eosinophils Relative 3 %   Eosinophils Absolute 0.3 0.0 - 0.5 K/uL   Basophils Relative 0 %   Basophils Absolute 0.0 0.0 - 0.1 K/uL   Immature Granulocytes 2 %   Abs Immature Granulocytes 0.19 (H) 0.00 - 0.07 K/uL  Lipase, blood     Status: None   Collection Time: 07/01/21  2:55 AM  Result Value Ref Range   Lipase 46 11 - 51 U/L  Comprehensive metabolic panel     Status: Abnormal   Collection Time: 07/01/21  2:55 AM  Result Value Ref Range   Sodium 139 135 - 145 mmol/L   Potassium 3.5 3.5 - 5.1 mmol/L   Chloride 107 98 - 111 mmol/L   CO2 21 (L) 22 - 32 mmol/L   Glucose, Bld 114 (H) 70 - 99 mg/dL   BUN 19 6 - 20 mg/dL   Creatinine, Ser 0.78 0.61 - 1.24 mg/dL   Calcium 9.4 8.9 - 10.3 mg/dL   Total Protein 7.2 6.5 - 8.1 g/dL   Albumin 4.1 3.5 - 5.0 g/dL   AST 25 15 - 41 U/L   ALT 29 0 - 44 U/L   Alkaline Phosphatase 85 38 - 126 U/L   Total Bilirubin 0.5 0.3 - 1.2 mg/dL   GFR, Estimated >60 >60 mL/min   Anion gap 11 5 - 15  Urinalysis, Routine w reflex microscopic Urine, Clean Catch     Status: Abnormal   Collection Time: 07/01/21  2:59 AM  Result Value Ref Range   Color, Urine YELLOW YELLOW   APPearance CLEAR CLEAR   Specific Gravity, Urine >=1.030 1.005 - 1.030   pH 5.0 5.0 - 8.0   Glucose, UA NEGATIVE  NEGATIVE mg/dL   Hgb urine dipstick TRACE (A) NEGATIVE   Bilirubin Urine NEGATIVE NEGATIVE   Ketones, ur NEGATIVE NEGATIVE mg/dL   Protein, ur NEGATIVE NEGATIVE mg/dL   Nitrite NEGATIVE NEGATIVE   Leukocytes,Ua NEGATIVE NEGATIVE  Urinalysis, Microscopic (reflex)     Status: Abnormal   Collection Time: 07/01/21  2:59 AM  Result Value Ref Range   RBC / HPF 0-5 0 - 5 RBC/hpf   WBC, UA 0-5 0 - 5 WBC/hpf   Bacteria, UA RARE (A) NONE SEEN   Squamous Epithelial / LPF 0-5 0 - 5   Ca Oxalate Crys, UA PRESENT    Imaging Studies: No results found.  ED COURSE and MDM  Nursing notes, initial and subsequent vitals signs, including pulse oximetry, reviewed and interpreted by myself.  Vitals:   07/01/21 0251 07/01/21 0300 07/01/21 0330 07/01/21 0400  BP:  (!) 156/107 (!) 147/104 (!) 147/111  Pulse:  (!) 105 100 99  Resp:  12 17 17   Temp:      TempSrc:      SpO2:  96% 95% 95%  Weight: 90.7 kg     Height: 6\' 1"  (1.854 m)      Medications  sodium chloride 0.9 % bolus 1,000 mL (1,000 mLs Intravenous New Bag/Given 07/01/21 0308)  promethazine (PHENERGAN) 25 mg in sodium chloride 0.9 % 50 mL IVPB (0 mg Intravenous Stopped 07/01/21 0345)  HYDROmorphone (DILAUDID) injection 1 mg (1 mg Intravenous Given 07/01/21 0410)  promethazine (PHENERGAN) 25 MG/ML injection (  Given 07/01/21 0330)   4:23 AM Patient states his pain level is down to baseline and he is ready to go home.  He was advised of reassuring lab work.  He was concerned his right-sided pain may be due to a kidney stone but his pain is not in the flank it is up higher.  He does have some calcium oxalate crystals in his urine but no gross or microscopic hematuria and the degree of pain he is having does not appear consistent with a kidney stone.  His high urine specific gravity is consistent with dehydration, likely from reported nausea and vomiting.   PROCEDURES  Procedures   ED DIAGNOSES     ICD-10-CM   1. Chronic abdominal pain  R10.9     G89.29          Ahmya Bernick, Jenny Reichmann, MD 07/01/21 0425

## 2021-07-01 NOTE — ED Notes (Signed)
Patient verbalizes understanding of discharge instructions. Opportunity for questioning and answers were provided. Armband removed by staff, pt discharged from ED. Ambulated out to lobby with wife  

## 2021-07-01 NOTE — ED Triage Notes (Signed)
Pt c/o right sided abdominal pain, nausea and vomiting that started 3 days ago. Pain has worsened over past few days. Pt denies fevers.

## 2021-07-02 ENCOUNTER — Other Ambulatory Visit: Payer: Self-pay

## 2021-07-02 ENCOUNTER — Encounter (HOSPITAL_BASED_OUTPATIENT_CLINIC_OR_DEPARTMENT_OTHER): Payer: Self-pay | Admitting: *Deleted

## 2021-07-02 ENCOUNTER — Emergency Department (HOSPITAL_BASED_OUTPATIENT_CLINIC_OR_DEPARTMENT_OTHER)
Admission: EM | Admit: 2021-07-02 | Discharge: 2021-07-02 | Disposition: A | Discharge status: Home / Self Care | Payer: No Typology Code available for payment source | Attending: Emergency Medicine | Admitting: Emergency Medicine

## 2021-07-02 DIAGNOSIS — G8929 Other chronic pain: Secondary | ICD-10-CM | POA: Diagnosis not present

## 2021-07-02 DIAGNOSIS — R1013 Epigastric pain: Secondary | ICD-10-CM | POA: Diagnosis not present

## 2021-07-02 DIAGNOSIS — Z79899 Other long term (current) drug therapy: Secondary | ICD-10-CM | POA: Insufficient documentation

## 2021-07-02 DIAGNOSIS — R109 Unspecified abdominal pain: Secondary | ICD-10-CM | POA: Diagnosis present

## 2021-07-02 LAB — CBC WITH DIFFERENTIAL/PLATELET
Abs Immature Granulocytes: 0.22 10*3/uL — ABNORMAL HIGH (ref 0.00–0.07)
Basophils Absolute: 0.1 10*3/uL (ref 0.0–0.1)
Basophils Relative: 1 %
Eosinophils Absolute: 0.3 10*3/uL (ref 0.0–0.5)
Eosinophils Relative: 3 %
HCT: 41.4 % (ref 39.0–52.0)
Hemoglobin: 14.9 g/dL (ref 13.0–17.0)
Immature Granulocytes: 2 %
Lymphocytes Relative: 22 %
Lymphs Abs: 2.6 10*3/uL (ref 0.7–4.0)
MCH: 30.5 pg (ref 26.0–34.0)
MCHC: 36 g/dL (ref 30.0–36.0)
MCV: 84.7 fL (ref 80.0–100.0)
Monocytes Absolute: 1 10*3/uL (ref 0.1–1.0)
Monocytes Relative: 9 %
Neutro Abs: 7.5 10*3/uL (ref 1.7–7.7)
Neutrophils Relative %: 63 %
Platelets: 229 10*3/uL (ref 150–400)
RBC: 4.89 MIL/uL (ref 4.22–5.81)
RDW: 12.9 % (ref 11.5–15.5)
WBC: 11.8 10*3/uL — ABNORMAL HIGH (ref 4.0–10.5)
nRBC: 0 % (ref 0.0–0.2)

## 2021-07-02 LAB — LIPASE, BLOOD: Lipase: 42 U/L (ref 11–51)

## 2021-07-02 LAB — COMPREHENSIVE METABOLIC PANEL
ALT: 30 U/L (ref 0–44)
AST: 26 U/L (ref 15–41)
Albumin: 3.7 g/dL (ref 3.5–5.0)
Alkaline Phosphatase: 71 U/L (ref 38–126)
Anion gap: 7 (ref 5–15)
BUN: 18 mg/dL (ref 6–20)
CO2: 22 mmol/L (ref 22–32)
Calcium: 8.7 mg/dL — ABNORMAL LOW (ref 8.9–10.3)
Chloride: 110 mmol/L (ref 98–111)
Creatinine, Ser: 0.86 mg/dL (ref 0.61–1.24)
GFR, Estimated: >60 mL/min (ref >60)
Glucose, Bld: 117 mg/dL — ABNORMAL HIGH (ref 70–99)
Potassium: 3.7 mmol/L (ref 3.5–5.1)
Sodium: 139 mmol/L (ref 135–145)
Total Bilirubin: 0.4 mg/dL (ref 0.3–1.2)
Total Protein: 6.7 g/dL (ref 6.5–8.1)

## 2021-07-02 MED ORDER — LACTATED RINGERS IV BOLUS
1000.0000 mL | Freq: Once | INTRAVENOUS | Status: AC
  Administered 2021-07-02: 1000 mL via INTRAVENOUS

## 2021-07-02 MED ORDER — PROMETHAZINE HCL 25 MG/ML IJ SOLN
INTRAMUSCULAR | Status: AC
  Filled 2021-07-02: qty 1

## 2021-07-02 MED ORDER — FENTANYL CITRATE PF 50 MCG/ML IJ SOSY
50.0000 ug | PREFILLED_SYRINGE | Freq: Once | INTRAMUSCULAR | Status: AC
  Administered 2021-07-02: 50 ug via INTRAVENOUS
  Filled 2021-07-02: qty 1

## 2021-07-02 MED ORDER — SODIUM CHLORIDE 0.9 % IV SOLN
25.0000 mg | Freq: Once | INTRAVENOUS | Status: AC
  Administered 2021-07-02: 25 mg via INTRAVENOUS
  Filled 2021-07-02: qty 1

## 2021-07-02 NOTE — ED Triage Notes (Signed)
Pt c/o continued upper abd pain and n/v. Seen here last night for same. States sx are not any better

## 2021-07-02 NOTE — ED Notes (Signed)
Discharge teaching and instructions reviewed with patient.  Patient voiced understanding.  Was ready to leave and did not want to wait to sign d/c.  Verbal consent given and voiced

## 2021-07-02 NOTE — ED Provider Notes (Signed)
[facility]  Provider Note  CSN: [id number] Arrival date & time: 07/02/21 1928  History Chief Complaint  Patient presents with   Abdominal Pain    [name] is a 46 y.o. male with chronic abdominal pain and frequent ED visits was here last night for same. ED workup was unremarkable and symptoms improved at discharge but he reports persistent vomiting unable to keep anything down all day. He was previously getting oxycodone Rx from PCP but she wrote her last Rx in mid Jan with the understanding he would be seeing Pain management a week later. Patient reports he is scheduled to see them in about 2 weeks. He is also scheduled to see GI in 10 days. No fever. No blood in vomit. Pain is typical for him.    Home Medications Prior to Admission medications   Medication Sig Start Date End Date Taking? Authorizing Provider  amLODipine (NORVASC) 5 MG tablet Take 1 tablet (5 mg total) by mouth daily. 04/29/21   [place], Betty G, MD  benazepril (LOTENSIN) 40 MG tablet Take 1 tablet (40 mg total) by mouth daily. 04/29/21   [place], Betty G, MD  fluticasone (FLONASE) 50 MCG/ACT nasal spray Place 1 spray into both nostrils 2 (two) times daily. 05/26/21   [place], Betty G, MD  KLOR-CON M20 20 MEQ tablet TAKE 1 TABLET BY MOUTH EVERY DAY 05/30/21   [place], Betty G, MD  metoprolol succinate (TOPROL-XL) 100 MG 24 hr tablet TAKE 1 TABLET BY MOUTH DAILY. TAKE WITH OR IMMEDIATELY FOLLOWING A MEAL. 05/01/21   [place], Betty G, MD  naloxone [facility]) nasal spray 4 mg/0.1 mL 1 spray in each nostril x 1 if opioid overdose. 06/08/20   [place], Betty G, MD  ondansetron (ZOFRAN ODT) 4 MG disintegrating tablet Place 1 tablet under the tongue every 12 hours PRN nausea 06/16/21   Hayden Rasmussen, MD  pantoprazole (PROTONIX) 40 MG tablet Take 1 tablet (40 mg total) by mouth daily. Patient taking differently: Take 40 mg by mouth daily as needed (heart burn). 12/25/20   Lajean Saver, MD   promethazine (PHENERGAN) 25 MG suppository Place 1 suppository (25 mg total) rectally every 6 (six) hours as needed for nausea or vomiting. 20/25/42   Delora Fuel, MD  promethazine (PHENERGAN) 25 MG tablet Take 1 tablet (25 mg total) by mouth every 6 (six) hours as needed. 70/62/37   Delora Fuel, MD  rosuvastatin (CRESTOR) 10 MG tablet Take 1 tablet (10 mg total) by mouth daily. 05/23/21   [place], Betty G, MD  sucralfate (CARAFATE) 1 g tablet Take 1 tablet (1 g total) by mouth 4 (four) times daily -  with meals and at bedtime. 06/01/21 08/30/21  Farrel Gordon, DO     Allergies    Cortisone, Eggs or egg-derived products, Ketorolac, Ketorolac tromethamine, Haldol [haloperidol lactate], Haloperidol, Hydrocortisone, Iodinated contrast media, Ketorolac tromethamine, Reglan [metoclopramide], Compazine [prochlorperazine], and Droperidol   Review of Systems   Review of Systems Please see HPI for pertinent positives and negatives  Physical Exam BP (!) 139/106    Pulse 89    Temp 98.5 F (36.9 C) (Oral)    Resp 15    Ht 6\' 1"  (1.854 m)    Wt 90.7 kg    SpO2 97%    BMI 26.39 kg/m   Physical Exam Vitals and nursing note reviewed.  Constitutional:      Appearance: Normal appearance.  HENT:     Head: Normocephalic and atraumatic.  Nose: Nose normal.     Mouth/Throat:     Mouth: Mucous membranes are moist.  Eyes:     Extraocular Movements: Extraocular movements intact.     Conjunctiva/sclera: Conjunctivae normal.  Cardiovascular:     Rate and Rhythm: Normal rate.  Pulmonary:     Effort: Pulmonary effort is normal.     Breath sounds: Normal breath sounds.  Abdominal:     General: Abdomen is flat.     Palpations: Abdomen is soft.     Tenderness: There is abdominal tenderness in the epigastric area.  Musculoskeletal:        General: No swelling. Normal range of motion.     Cervical back: Neck supple.  Skin:    General: Skin is warm and dry.  Neurological:     General: No focal deficit  present.     Mental Status: He is alert.  Psychiatric:        Mood and Affect: Mood normal.    ED Results / Procedures / Treatments   EKG EKG Interpretation  Date/Time:  Sunday July 02 2021 20:59:57 EST Ventricular Rate:  84 PR Interval:  153 QRS Duration: 95 QT Interval:  375 QTC Calculation: 444 R Axis:   30 Text Interpretation: Sinus rhythm Normal ECG No significant change since last tracing Confirmed by Calvert Cantor [phone]) on 07/02/2021 9:34:12 PM  Procedures Procedures  Medications Ordered in the ED Medications  promethazine (PHENERGAN) 25 MG/ML injection (has no administration in time range)  promethazine (PHENERGAN) 25 mg in sodium chloride 0.9 % 50 mL IVPB (0 mg Intravenous Stopped 07/02/21 2219)  fentaNYL (SUBLIMAZE) injection 50 mcg (50 mcg Intravenous Given 07/02/21 2110)  lactated ringers bolus 1,000 mL (0 mLs Intravenous Stopped 07/02/21 2219)    Initial Impression and Plan  Patient with reported intractable vomiting from chronic abdominal pain since ED visit last night. Will recheck labs to compare for signs of dehydration. Pain/nausea meds for comfort. No indication for imaging. Consider admission if any significant lab abnormalities.   ED Course   Clinical Course as of 07/02/21 2222  Nancy Fetter Jul 02, 2021  2133 CBC, CMP and Lipase unchanged from baseline.  [CS]  2220 Patient resting comfortably. Has not had any vomiting since arrival. Plan discharge with continued outpatient management of his chronic issues. No indication for further ED workup or admission at this time.  [CS]    Clinical Course User Index [CS] Truddie Hidden, MD     MDM Rules/Calculators/A&P Medical Decision Making Problems Addressed: Chronic abdominal pain: chronic illness or injury with exacerbation, progression, or side effects of treatment  Amount and/or Complexity of Data Reviewed Labs: ordered. Decision-making details documented in ED Course.  Risk Prescription drug  management. Parenteral controlled substances. Decision regarding hospitalization.    Final Clinical Impression(s) / ED Diagnoses Final diagnoses:  Chronic abdominal pain    Rx / DC Orders ED Discharge Orders     None        Truddie Hidden, MD 07/02/21 2222

## 2021-07-03 NOTE — Progress Notes (Signed)
HPI: [name] is a 46 y.o. male, who is here today to follow on recent ED visit. Since his last visit he has been evaluated in the ED 5 times: 1/17,1/19,1/20,2/4,and 2/5.  Left-sided abdominal pain that has been chronic. RUQ pain for the past few weeks. Pain is severe, not radiated. He has not identified exacerbating or alleviating factors. Appt with general surgeon is pending.  He has not noted fever,chills,melena,blood in stool,or urinary symptoms.  Lab Results  Component Value Date   WBC 11.8 (H) 07/02/2021   HGB 14.9 07/02/2021   HCT 41.4 07/02/2021   MCV 84.7 07/02/2021   PLT 229 07/02/2021   Lab Results  Component Value Date   ALT 30 07/02/2021   AST 26 07/02/2021   ALKPHOS 71 07/02/2021   BILITOT 0.4 07/02/2021   Lab Results  Component Value Date   CREATININE 0.86 07/02/2021   BUN 18 07/02/2021   NA 139 07/02/2021   K 3.7 07/02/2021   CL 110 07/02/2021   CO2 22 07/02/2021   Abdominal CT done on 04/01/21,05/04/21,06/01/21,and 06/07/21.  Abdomen and pelvis CT  06/07/21 was negative for acute abnormality or findings to count for reported symptoms. ERCP in 12/2020 and 03/04/21: The prior biliary sphincteroplasty appeared open. - The minor papilla appeared normal. - Choledocholithiasis was found. Complete removal was accomplished by biliary sphincterotomy and balloon extraction. - A biliary sphincterotomy was performed. - The biliary tree was swept.  07/19/21 at 10 Am for pain management,he does not remember name of provider.  He is not eating much because nausea and vomiting. She is on Phenergan and Zofran. Has not tolerated Reglan in the past.  Intermittent episodes of diarrhea , which has happened before associated with pain. 3-4 stools per day. Night sweats when pain is "bad." He has an appt with Dr Henrene Pastor, GI, on 07/12/21.  HTN on Amlodipine 5 mg daily and Benazepril 40 mg daily. BP is usually < 140/90 when pain is better controlled. Negative for  CP,SOB,palpitations,or focal neurologic deficit.  WBC's have been mildly elevated for a while now. He has not noted enlarged glands,ecchymosis, skin rash,or abnormal wt loss.  Review of Systems  Constitutional:  Positive for appetite change and fatigue. Negative for activity change.  HENT:  Negative for nosebleeds, sore throat and trouble swallowing.   Eyes:  Negative for redness and visual disturbance.  Respiratory:  Negative for cough and wheezing.   Cardiovascular:  Negative for leg swelling.  Genitourinary:  Negative for decreased urine volume, dysuria and hematuria.  Musculoskeletal:  Positive for back pain (Chronic). Negative for gait problem and myalgias.  Neurological:  Negative for syncope, weakness and headaches.  Psychiatric/Behavioral:  Negative for confusion. The patient is nervous/anxious.   Rest see pertinent positives and negatives per HPI.  Current Outpatient Medications on File Prior to Visit  Medication Sig Dispense Refill   amLODipine (NORVASC) 5 MG tablet Take 1 tablet (5 mg total) by mouth daily. 90 tablet 1   benazepril (LOTENSIN) 40 MG tablet Take 1 tablet (40 mg total) by mouth daily. 90 tablet 1   fluticasone (FLONASE) 50 MCG/ACT nasal spray Place 1 spray into both nostrils 2 (two) times daily. 16 g 2   KLOR-CON M20 20 MEQ tablet TAKE 1 TABLET BY MOUTH EVERY DAY 30 tablet 0   metoprolol succinate (TOPROL-XL) 100 MG 24 hr tablet TAKE 1 TABLET BY MOUTH DAILY. TAKE WITH OR IMMEDIATELY FOLLOWING A MEAL. 90 tablet 1   naloxone (NARCAN) nasal spray 4 mg/0.1 mL  1 spray in each nostril x 1 if opioid overdose. 1 each 1   ondansetron (ZOFRAN ODT) 4 MG disintegrating tablet Place 1 tablet under the tongue every 12 hours PRN nausea 20 tablet 0   pantoprazole (PROTONIX) 40 MG tablet Take 1 tablet (40 mg total) by mouth daily. (Patient taking differently: Take 40 mg by mouth daily as needed (heart burn).) 30 tablet 0   promethazine (PHENERGAN) 25 MG suppository Place 1  suppository (25 mg total) rectally every 6 (six) hours as needed for nausea or vomiting. 12 each 0   promethazine (PHENERGAN) 25 MG tablet Take 1 tablet (25 mg total) by mouth every 6 (six) hours as needed. 45 tablet 1   rosuvastatin (CRESTOR) 10 MG tablet Take 1 tablet (10 mg total) by mouth daily. 30 tablet 3   sucralfate (CARAFATE) 1 g tablet Take 1 tablet (1 g total) by mouth 4 (four) times daily -  with meals and at bedtime. 360 tablet 0   No current facility-administered medications on file prior to visit.   Past Medical History:  Diagnosis Date   Chronic abdominal pain    Diverticulitis    Drug-seeking behavior    Hypertension    Kidney stones    Pancreatitis, chronic ([place]) 05/28/2005   Allergies  Allergen Reactions   Cortisone Other (See Comments)    drops potassium level "bottoms out" potassium level drops potassium level Bottoms out potassium  "bottoms out" potassium level Other reaction(s): Other (See Comments) Bottoms out potassium   Eggs Or Egg-Derived Products Hives and Other (See Comments)    Rash  Rash   Ketorolac Hives and Other (See Comments)    Hives, slightly labored breathing  Hives, slightly labored breathing   Ketorolac Tromethamine Hives and Shortness Of Breath   Haldol [Haloperidol Lactate] Other (See Comments)    "jittery"    Haloperidol Other (See Comments)    "jittery" "jittery"    Hydrocortisone Hives    Also drops potassium level   Iodinated Contrast Media Nausea And Vomiting and Other (See Comments)    Hives Hives Hives Hives   Ketorolac Tromethamine Hives   Reglan [Metoclopramide]     Face "draws" and gets figgety   Compazine [Prochlorperazine] Other (See Comments)    Dystonic reaction   Droperidol Other (See Comments)    Makes face draw up    Social History   Socioeconomic History   Marital status: Married    Spouse name: Not on file   Number of children: 1   Years of education: Not on file   Highest education level:  Some college, no degree  Occupational History   Not on file  Tobacco Use   Smoking status: Every Day    Packs/day: 0.50    Types: Cigarettes   Smokeless tobacco: Never  Vaping Use   Vaping Use: Never used  Substance and Sexual Activity   Alcohol use: Not Currently    Comment: rarely   Drug use: No   Sexual activity: Not on file  Other Topics Concern   Not on file  Social History Narrative   Not on file   Social Determinants of Health   Financial Resource Strain: Low Risk    Difficulty of Paying Living Expenses: Not hard at all  Food Insecurity: No Food Insecurity   Worried About Charity fundraiser in the Last Year: Never true   Ran Out of Food in the Last Year: Never true  Transportation Needs: No Transportation Needs  Lack of Transportation (Medical): No   Lack of Transportation (Non-Medical): No  Physical Activity: Insufficiently Active   Days of Exercise per Week: 1 day   Minutes of Exercise per Session: 20 min  Stress: No Stress Concern Present   Feeling of Stress : Only a little  Social Connections: Engineer, building services of Communication with Friends and Family: Three times a week   Frequency of Social Gatherings with Friends and Family: Once a week   Attends Religious Services: More than 4 times per year   Active Member of Clubs or Organizations: Yes   Attends Archivist Meetings: More than 4 times per year   Marital Status: Married    Vitals:   07/04/21 0708  BP: 140/80  Pulse: (!) 104  Resp: 16  SpO2: 97%   Body mass index is 26.38 kg/m.  Physical Exam Vitals and nursing note reviewed.  Constitutional:      General: He is not in acute distress (He seems to be in pain).    Appearance: He is well-developed. He is not ill-appearing.  HENT:     Head: Normocephalic and atraumatic.     Mouth/Throat:     Mouth: Mucous membranes are moist.     Dentition: Has dentures.  Eyes:     Conjunctiva/sclera: Conjunctivae normal.   Cardiovascular:     Rate and Rhythm: Regular rhythm. Tachycardia present.     Pulses:          Posterior tibial pulses are 2+ on the right side and 2+ on the left side.     Heart sounds: No murmur heard. Pulmonary:     Effort: Pulmonary effort is normal. No respiratory distress.     Breath sounds: Normal breath sounds.  Abdominal:     Palpations: Abdomen is soft. There is no hepatomegaly or mass.     Tenderness: There is abdominal tenderness in the right upper quadrant and left upper quadrant. There is no guarding or rebound.  Lymphadenopathy:     Cervical: No cervical adenopathy.  Skin:    General: Skin is warm.     Findings: No erythema or rash.  Neurological:     Mental Status: He is alert and oriented to person, place, and time.     Cranial Nerves: No cranial nerve deficit.     Gait: Gait normal.  Psychiatric:     Comments: Well groomed, good eye contact.   ASSESSMENT AND PLAN:  Mr.Bijan was seen today for follow-up.  Diagnoses and all orders for this visit:  Chronic abdominal pain Unknown etiology, thought to be caused by chronic pancreatitis. Work-up otherwise negative. Instructed about warning signs. Keep appt with GI and pain management. Pending appt with general surgeon to discuss possible surgical dx options, I am not sure if exploratory laparoscopic procedure would be an options.  -     oxyCODONE-acetaminophen (PERCOCET/ROXICET) 5-325 MG tablet; Take 1 tablet by mouth 2 (two) times daily as needed for up to 15 days for severe pain.  Chronic pain disorder Today I agree with one last Rx for oxycodone, this time I sent Oxycodone-Acetaminophen 5-325 mg to take bid prn. He understands current recommendations in regard to chronic opioid use and side effects. He is to keep appt with pain management on 07/19/21.  -     oxyCODONE-acetaminophen (PERCOCET/ROXICET) 5-325 MG tablet; Take 1 tablet by mouth 2 (two) times daily as needed for up to 15 days for severe  pain.  Nausea and vomiting in adult  Problem is not well controlled. Gastroparesis is most likely a contributing factor. EGD 05/2018:Essentially normal EGD. Mild duodenal erythema status post CLO biopsy,no primary GI cause for chronic abdominal pain identified or suspected Continue Zofran and Phenergan. He has npt tolerated Reglan in the past. Keep appt with GI.  Leukocytosis, unspecified type Problem has been otherwise stable. We discussed possible causes. He is not interested in hematologic consultation at this time, which I agree, it does not seem to be caused by a serious process at this time. We will continue following.  Hypertension, essential, benign Mildly elevated, pain can be a contributing factor. Continue monitoring BP regularly. No changes in amlodipine or benazepril dose.  I spent a total of 41 minutes in both face to face and non face to face activities for this visit on the date of this encounter. During this time history was obtained and documented, examination was performed, prior labs/imaging reviewed, and assessment/plan discussed.  Return in about 4 months (around 11/01/2021).  Para Cossey G. Martinique, MD  [facility]. [place] office.

## 2021-07-04 ENCOUNTER — Ambulatory Visit: Payer: No Typology Code available for payment source | Admitting: Family Medicine

## 2021-07-04 ENCOUNTER — Encounter: Payer: Self-pay | Admitting: Family Medicine

## 2021-07-04 VITALS — BP 140/80 | HR 104 | Resp 16 | Ht 73.0 in | Wt 200.0 lb

## 2021-07-04 DIAGNOSIS — G8929 Other chronic pain: Secondary | ICD-10-CM

## 2021-07-04 DIAGNOSIS — R109 Unspecified abdominal pain: Secondary | ICD-10-CM | POA: Diagnosis not present

## 2021-07-04 DIAGNOSIS — I10 Essential (primary) hypertension: Secondary | ICD-10-CM

## 2021-07-04 DIAGNOSIS — G894 Chronic pain syndrome: Secondary | ICD-10-CM | POA: Diagnosis not present

## 2021-07-04 DIAGNOSIS — D72829 Elevated white blood cell count, unspecified: Secondary | ICD-10-CM

## 2021-07-04 DIAGNOSIS — R112 Nausea with vomiting, unspecified: Secondary | ICD-10-CM

## 2021-07-04 MED ORDER — OXYCODONE-ACETAMINOPHEN 5-325 MG PO TABS: 1.0000 | ORAL_TABLET | Freq: Two times a day (BID) | ORAL | 0 refills | Status: AC | PRN

## 2021-07-04 NOTE — Patient Instructions (Addendum)
A few things to remember from today's visit:  Chronic pain disorder - Plan: oxyCODONE-acetaminophen (PERCOCET/ROXICET) 5-325 MG tablet  Nausea and vomiting in adult  Chronic abdominal pain - Plan: oxyCODONE-acetaminophen (PERCOCET/ROXICET) 5-325 MG tablet  Leukocytosis, unspecified type  If you need refills please call your pharmacy. Do not use My Chart to request refills or for acute issues that need immediate attention.   Keep appt with gastroenterologists and pain management on 07/19/21.  Please be sure medication list is accurate. If a new problem present, please set up appointment sooner than planned today. Continue monitoring blood pressure. White blood cells elevation seems stable. I sent another prescription for pain med, this time combine with acetaminophen to continue 2 times daily as needed.

## 2021-07-12 ENCOUNTER — Encounter: Payer: Self-pay | Admitting: Internal Medicine

## 2021-07-12 ENCOUNTER — Ambulatory Visit: Payer: No Typology Code available for payment source | Admitting: Internal Medicine

## 2021-07-12 VITALS — BP 150/90 | HR 76 | Ht 71.25 in | Wt 217.1 lb

## 2021-07-12 DIAGNOSIS — R110 Nausea: Secondary | ICD-10-CM | POA: Diagnosis not present

## 2021-07-12 DIAGNOSIS — R109 Unspecified abdominal pain: Secondary | ICD-10-CM | POA: Diagnosis not present

## 2021-07-12 DIAGNOSIS — K8050 Calculus of bile duct without cholangitis or cholecystitis without obstruction: Secondary | ICD-10-CM | POA: Diagnosis not present

## 2021-07-12 DIAGNOSIS — G8929 Other chronic pain: Secondary | ICD-10-CM | POA: Diagnosis not present

## 2021-07-12 MED ORDER — PROMETHAZINE HCL 25 MG PO TABS: 25.0000 mg | ORAL_TABLET | Freq: Four times a day (QID) | ORAL | 1 refills | Status: DC | PRN

## 2021-07-12 NOTE — Patient Instructions (Signed)
If you are age 44 or older, your body mass index should be between 23-30. Your Body mass index is 30.07 kg/m. If this is out of the aforementioned range listed, please consider follow up with your Primary Care Provider.  If you are age 55 or younger, your body mass index should be between 19-25. Your Body mass index is 30.07 kg/m. If this is out of the aformentioned range listed, please consider follow up with your Primary Care Provider.   ________________________________________________________  The [facility] providers would like to encourage you to use [facility] to communicate with providers for non-urgent requests or questions.  Due to long hold times on the telephone, sending your provider a message by [facility] may be a faster and more efficient way to get a response.  Please allow 48 business hours for a response.  Please remember that this is for non-urgent requests.  _______________________________________________________  We have sent the following medications to your pharmacy for you to pick up at your convenience:  Phenergan

## 2021-07-12 NOTE — Progress Notes (Signed)
HISTORY OF PRESENT ILLNESS:  [name] is a 46 y.o. 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  Patient presents today with his significant other regarding ongoing chronic abdominal pain.  He has been to the emergency room on multiple occasions.  Most recent evaluations have revealed normal liver tests.  CT scans revealing pneumobilia consistent with patent sphincterotomy.  No choledocho lithiasis.  Recently saw his PCP.  GI assessment recommended.  Patient did undergo complete colonoscopy and upper endoscopy January 2020.  No significant abnormalities.  Last ERCP with stone extraction March 04, 2021 with Dr. Benson Norway.  Patient tells me that he has chronic nausea.  Will vomit once every other day.  Generally bile.  Tells me that he is out of nausea medicine.  They will not prescribe Zofran due to prolonged QT, per patient.  He has had Phenergan previously.  Would like a refill.  Apparently to see the pain clinic for chronic pain management.  REVIEW OF SYSTEMS:  All non-GI ROS negative unless otherwise stated in the HPI except for sinus and allergy trouble  Past Medical History:  Diagnosis Date   Chronic abdominal pain    Diverticulitis    Drug-seeking behavior    Hypertension    Kidney stones    Pancreatitis, chronic ([place]) 05/28/2005    Past Surgical History:  Procedure Laterality Date   APPENDECTOMY     CHOLECYSTECTOMY     ERCP N/A 12/30/2019   Procedure: ENDOSCOPIC RETROGRADE CHOLANGIOPANCREATOGRAPHY (ERCP);  Surgeon: Milus Banister, MD;  Location: [facility];  Service: Endoscopy;  Laterality: N/A;   ERCP N/A 03/04/2021   Procedure: ENDOSCOPIC RETROGRADE CHOLANGIOPANCREATOGRAPHY (ERCP);  Surgeon: Carol Ada, MD;  Location: [facility];  Service: Endoscopy;   Laterality: N/A;   ERCP W/ METAL STENT PLACEMENT     KIDNEY STONE SURGERY     REMOVAL OF STONES  12/30/2019   Procedure: REMOVAL OF STONES;  Surgeon: Milus Banister, MD;  Location: [facility] END[facility];  Service: Endoscopy;;   REMOVAL OF STONES  03/04/2021   Procedure: REMOVAL OF STONES;  Surgeon: Carol Ada, MD;  Location: [facility] END[facility];  Service: Endoscopy;;   SPHINCTEROTOMY  03/04/2021   Procedure: Joan Mayans;  Surgeon: Carol Ada, MD;  Location: [facility];  Service: Endoscopy;;    Social History Leda Gauze  reports that he has been smoking cigarettes. He has been smoking an average of .5 packs per day. He has never used smokeless tobacco. He reports that he does not currently use alcohol. He reports that he does not use drugs.  family history includes Breast cancer in his mother; Early death in his mother; Heart disease in his father; Heart failure in his father; Hyperlipidemia in his father; Hypertension in his father and mother; Pancreatic cancer in his paternal aunt and paternal grandmother.  Allergies  Allergen Reactions   Cortisone Other (See Comments)    drops potassium level "bottoms out" potassium level drops potassium level Bottoms out potassium  "bottoms out" potassium level Other reaction(s): Other (See Comments) Bottoms out potassium   Eggs Or Egg-Derived Products Hives and Other (See Comments)    Rash  Rash   Ketorolac Hives and Other (See Comments)    Hives, slightly labored breathing  Hives, slightly labored breathing   Ketorolac Tromethamine Hives and Shortness Of Breath   Haldol [Haloperidol Lactate] Other (See Comments)    "  jittery"    Haloperidol Other (See Comments)    "jittery" "jittery"    Hydrocortisone Hives    Also drops potassium level   Iodinated Contrast Media Nausea And Vomiting and Other (See Comments)    Hives Hives Hives Hives   Ketorolac Tromethamine Hives   Reglan [Metoclopramide]     Face "draws" and gets figgety    Compazine [Prochlorperazine] Other (See Comments)    Dystonic reaction   Droperidol Other (See Comments)    Makes face draw up       PHYSICAL EXAMINATION: Vital signs: BP (!) 150/90 (BP Location: Left Arm, Patient Position: Sitting, Cuff Size: Normal)    Pulse 76    Ht 5' 11.25" (1.81 m) Comment: height measured without shoes   Wt 217 lb 2 oz (98.5 kg)    BMI 30.07 kg/m   Constitutional: generally well-appearing, no acute distress Psychiatric: alert and oriented x3, cooperative Eyes: extraocular movements intact, anicteric, conjunctiva pink Mouth: oral pharynx moist, no lesions Neck: supple no lymphadenopathy Cardiovascular: heart regular rate and rhythm, no murmur Lungs: clear to auscultation bilaterally Abdomen: soft, tenderness with mild palpation over the superficial abdominal wall in the upper abdomen, nondistended, no obvious ascites, no peritoneal signs, normal bowel sounds, no organomegaly Rectal: Omitted Extremities: no clubbing, cyanosis, or lower extremity edema bilaterally Skin: no lesions on visible extremities Neuro: No focal deficits.  Cranial nerves intact  ASSESSMENT:  1.  Chronic pain syndrome.  Ongoing.  No evidence for objective problems at this time.  Multiple negative recent ER evaluations. 2.  History of choledocholithiasis postcholecystectomy.  No evidence for recurrence at present. 3.  Chronic pancreatitis.  Ill-defined.  Multiple caregivers in multiple locations over time. 4.  Chronic nausea.  Likely pain medication related. 5.  GERD.  On pantoprazole.  No active complaints. 6.  Upper endoscopy January 2020 was normal 7.  Colonoscopy January 2020 with diminutive adenoma.  Follow-up in 5 years recommended   PLAN:  1.  Establish with pain clinic ASAP 2.  Prescribed Phenergan 25 mg tablets.  #30.  1 refill.  1 p.o. every 6 hours as needed severe nausea/vomiting.  No further refills from this office.  He understands.  Medication risks reviewed 3.  Reflux  precautions 4.  Continue PPI 5.  Surveillance colonoscopy around 2025-2027 6.  Ongoing general medical care with PCP A total time of 30 minutes was spent preparing to see the patient, reviewing a myriad of tests and x-rays as well as ER visits, obtaining comprehensive history, performing medically appropriate physical exam, counseling the patient and his wife regarding his above listed issues, ordering medication, and documenting clinical information in the health record

## 2021-07-13 ENCOUNTER — Encounter: Payer: No Typology Code available for payment source | Admitting: Physical Medicine and Rehabilitation

## 2021-07-14 ENCOUNTER — Ambulatory Visit: Payer: No Typology Code available for payment source | Admitting: Family Medicine

## 2021-07-25 ENCOUNTER — Emergency Department (HOSPITAL_BASED_OUTPATIENT_CLINIC_OR_DEPARTMENT_OTHER): Payer: No Typology Code available for payment source

## 2021-07-25 ENCOUNTER — Encounter (HOSPITAL_BASED_OUTPATIENT_CLINIC_OR_DEPARTMENT_OTHER): Payer: Self-pay

## 2021-07-25 ENCOUNTER — Other Ambulatory Visit: Payer: Self-pay

## 2021-07-25 ENCOUNTER — Emergency Department (HOSPITAL_BASED_OUTPATIENT_CLINIC_OR_DEPARTMENT_OTHER)
Admission: EM | Admit: 2021-07-25 | Discharge: 2021-07-25 | Disposition: A | Discharge status: Home / Self Care | Payer: No Typology Code available for payment source | Attending: Emergency Medicine | Admitting: Emergency Medicine

## 2021-07-25 DIAGNOSIS — R1013 Epigastric pain: Secondary | ICD-10-CM | POA: Insufficient documentation

## 2021-07-25 DIAGNOSIS — R1011 Right upper quadrant pain: Secondary | ICD-10-CM | POA: Insufficient documentation

## 2021-07-25 DIAGNOSIS — R140 Abdominal distension (gaseous): Secondary | ICD-10-CM | POA: Insufficient documentation

## 2021-07-25 DIAGNOSIS — Z79899 Other long term (current) drug therapy: Secondary | ICD-10-CM | POA: Diagnosis not present

## 2021-07-25 LAB — CBC WITH DIFFERENTIAL/PLATELET
Abs Immature Granulocytes: 0.07 10*3/uL (ref 0.00–0.07)
Basophils Absolute: 0.1 10*3/uL (ref 0.0–0.1)
Basophils Relative: 1 %
Eosinophils Absolute: 0.5 10*3/uL (ref 0.0–0.5)
Eosinophils Relative: 4 %
HCT: 45.4 % (ref 39.0–52.0)
Hemoglobin: 16.3 g/dL (ref 13.0–17.0)
Immature Granulocytes: 1 %
Lymphocytes Relative: 32 %
Lymphs Abs: 3.6 10*3/uL (ref 0.7–4.0)
MCH: 30.7 pg (ref 26.0–34.0)
MCHC: 35.9 g/dL (ref 30.0–36.0)
MCV: 85.5 fL (ref 80.0–100.0)
Monocytes Absolute: 1.1 10*3/uL — ABNORMAL HIGH (ref 0.1–1.0)
Monocytes Relative: 10 %
Neutro Abs: 6.1 10*3/uL (ref 1.7–7.7)
Neutrophils Relative %: 52 %
Platelets: 204 10*3/uL (ref 150–400)
RBC: 5.31 MIL/uL (ref 4.22–5.81)
RDW: 12.7 % (ref 11.5–15.5)
WBC: 11.4 10*3/uL — ABNORMAL HIGH (ref 4.0–10.5)
nRBC: 0 % (ref 0.0–0.2)

## 2021-07-25 LAB — COMPREHENSIVE METABOLIC PANEL
ALT: 36 U/L (ref 0–44)
AST: 28 U/L (ref 15–41)
Albumin: 4.3 g/dL (ref 3.5–5.0)
Alkaline Phosphatase: 67 U/L (ref 38–126)
Anion gap: 9 (ref 5–15)
BUN: 15 mg/dL (ref 6–20)
CO2: 26 mmol/L (ref 22–32)
Calcium: 9.1 mg/dL (ref 8.9–10.3)
Chloride: 103 mmol/L (ref 98–111)
Creatinine, Ser: 0.81 mg/dL (ref 0.61–1.24)
GFR, Estimated: >60 mL/min (ref >60)
Glucose, Bld: 104 mg/dL — ABNORMAL HIGH (ref 70–99)
Potassium: 3.4 mmol/L — ABNORMAL LOW (ref 3.5–5.1)
Sodium: 138 mmol/L (ref 135–145)
Total Bilirubin: 0.7 mg/dL (ref 0.3–1.2)
Total Protein: 7.6 g/dL (ref 6.5–8.1)

## 2021-07-25 LAB — URINALYSIS, ROUTINE W REFLEX MICROSCOPIC
Bilirubin Urine: NEGATIVE
Glucose, UA: NEGATIVE mg/dL
Hgb urine dipstick: NEGATIVE
Ketones, ur: NEGATIVE mg/dL
Leukocytes,Ua: NEGATIVE
Nitrite: NEGATIVE
Protein, ur: NEGATIVE mg/dL
Specific Gravity, Urine: >=1.03 (ref 1.005–1.030)
pH: 5.5 (ref 5.0–8.0)

## 2021-07-25 LAB — LIPASE, BLOOD: Lipase: 44 U/L (ref 11–51)

## 2021-07-25 MED ORDER — SUCRALFATE 1 G PO TABS: 1.0000 g | ORAL_TABLET | Freq: Three times a day (TID) | ORAL | 0 refills | Status: DC

## 2021-07-25 MED ORDER — FENTANYL CITRATE PF 50 MCG/ML IJ SOSY
50.0000 ug | PREFILLED_SYRINGE | Freq: Once | INTRAMUSCULAR | Status: AC
  Administered 2021-07-25: 50 ug via INTRAVENOUS
  Filled 2021-07-25: qty 1

## 2021-07-25 MED ORDER — SODIUM CHLORIDE 0.9 % IV BOLUS
1000.0000 mL | Freq: Once | INTRAVENOUS | Status: AC
  Administered 2021-07-25: 1000 mL via INTRAVENOUS

## 2021-07-25 NOTE — ED Triage Notes (Signed)
Pt to er, pt states that he is here for RUQ pain, states that it is a constant pain that it worse with cough or movement.  Pt states that he has a hx of pancreatitis, but this feels different.  Pt states that it was a gradual onset over the weekend.  Pt states that he has been having bm, but feels distended.

## 2021-07-25 NOTE — ED Provider Notes (Signed)
[facility] Provider Note   CSN: [id number] Arrival date & time: 07/25/21  0744     History  Chief Complaint  Patient presents with   Abdominal Pain    [name] is a 46 y.o. male.  The history is provided by the patient.  Abdominal Pain Pain location:  RUQ and R flank Pain quality: aching   Pain radiates to:  Does not radiate Pain severity:  Mild Onset quality:  Gradual Duration:  3 days Timing:  Constant Progression:  Unchanged Chronicity:  Recurrent Context comment:  Hx of kidney stones, pancreatitis, chronic abdoinal pain Relieved by:  Nothing Worsened by:  Nothing Associated symptoms: nausea and vomiting   Associated symptoms: no anorexia, no belching, no chest pain, no chills, no constipation, no cough, no diarrhea, no dysuria, no melena, no shortness of breath and no sore throat       Home Medications Prior to Admission medications   Medication Sig Start Date End Date Taking? Authorizing Provider  sucralfate (CARAFATE) 1 g tablet Take 1 tablet (1 g total) by mouth 4 (four) times daily -  with meals and at bedtime for 14 days. 07/25/21 08/08/21 Yes Eliyohu Class, DO  amLODipine (NORVASC) 5 MG tablet Take 1 tablet (5 mg total) by mouth daily. 04/29/21   [place], Betty G, MD  benazepril (LOTENSIN) 40 MG tablet Take 1 tablet (40 mg total) by mouth daily. 04/29/21   [place], Betty G, MD  fluticasone (FLONASE) 50 MCG/ACT nasal spray Place 1 spray into both nostrils 2 (two) times daily. 05/26/21   [place], Betty G, MD  KLOR-CON M20 20 MEQ tablet TAKE 1 TABLET BY MOUTH EVERY DAY 07/04/21   [place], Betty G, MD  metoprolol succinate (TOPROL-XL) 100 MG 24 hr tablet TAKE 1 TABLET BY MOUTH DAILY. TAKE WITH OR IMMEDIATELY FOLLOWING A MEAL. 05/01/21   [place], Betty G, MD  naloxone [facility]) nasal spray 4 mg/0.1 mL 1 spray in each nostril x 1 if opioid overdose. 06/08/20   [place], Betty G, MD  ondansetron (ZOFRAN ODT) 4 MG disintegrating tablet Place 1  tablet under the tongue every 12 hours PRN nausea 06/16/21   Hayden Rasmussen, MD  pantoprazole (PROTONIX) 40 MG tablet Take 1 tablet (40 mg total) by mouth daily. Patient taking differently: Take 40 mg by mouth daily as needed (heart burn). 12/25/20   Lajean Saver, MD  promethazine (PHENERGAN) 25 MG suppository Place 1 suppository (25 mg total) rectally every 6 (six) hours as needed for nausea or vomiting. 62/56/38   Delora Fuel, MD  promethazine (PHENERGAN) 25 MG tablet Take 1 tablet (25 mg total) by mouth every 6 (six) hours as needed. 93/73/42   Delora Fuel, MD  promethazine (PHENERGAN) 25 MG tablet Take 1 tablet (25 mg total) by mouth every 6 (six) hours as needed for nausea or vomiting. 07/12/21   Irene Shipper, MD  rosuvastatin (CRESTOR) 10 MG tablet Take 1 tablet (10 mg total) by mouth daily. 05/23/21   [place], Betty G, MD      Allergies    Cortisone, Eggs or egg-derived products, Ketorolac, Ketorolac tromethamine, Haldol [haloperidol lactate], Haloperidol, Hydrocortisone, Iodinated contrast media, Ketorolac tromethamine, Reglan [metoclopramide], Compazine [prochlorperazine], and Droperidol    Review of Systems   Review of Systems  Constitutional:  Negative for chills.  HENT:  Negative for sore throat.   Respiratory:  Negative for cough and shortness of breath.   Cardiovascular:  Negative for chest pain.  Gastrointestinal:  Positive for  abdominal pain, nausea and vomiting. Negative for anorexia, constipation, diarrhea and melena.  Genitourinary:  Negative for dysuria.   Physical Exam Updated Vital Signs BP (!) 179/115 (BP Location: Right Arm)    Pulse 80    Temp 98.3 F (36.8 C) (Oral)    Resp 17    Ht 6\' 1"  (1.854 m)    Wt 95.3 kg    SpO2 100%    BMI 27.71 kg/m  Physical Exam Vitals and nursing note reviewed.  Constitutional:      General: He is not in acute distress.    Appearance: He is well-developed. He is not ill-appearing.  HENT:     Head: Normocephalic and atraumatic.   Eyes:     Extraocular Movements: Extraocular movements intact.     Conjunctiva/sclera: Conjunctivae normal.  Cardiovascular:     Rate and Rhythm: Normal rate and regular rhythm.     Heart sounds: Normal heart sounds. No murmur heard. Pulmonary:     Effort: Pulmonary effort is normal. No respiratory distress.     Breath sounds: Normal breath sounds.  Abdominal:     General: There is distension.     Palpations: Abdomen is soft.     Tenderness: There is abdominal tenderness in the right upper quadrant. There is right CVA tenderness.  Musculoskeletal:        General: No swelling.     Cervical back: Neck supple.  Skin:    General: Skin is warm and dry.     Capillary Refill: Capillary refill takes less than 2 seconds.  Neurological:     Mental Status: He is alert.  Psychiatric:        Mood and Affect: Mood normal.    ED Results / Procedures / Treatments   Labs (all labs ordered are listed, but only abnormal results are displayed) Labs Reviewed  CBC WITH DIFFERENTIAL/PLATELET - Abnormal; Notable for the following components:      Result Value   WBC 11.4 (*)    Monocytes Absolute 1.1 (*)    All other components within normal limits  COMPREHENSIVE METABOLIC PANEL - Abnormal; Notable for the following components:   Potassium 3.4 (*)    Glucose, Bld 104 (*)    All other components within normal limits  LIPASE, BLOOD  URINALYSIS, ROUTINE W REFLEX MICROSCOPIC    EKG None  Radiology CT Renal Stone Study  Result Date: 07/25/2021 CLINICAL DATA:  Right flank pain, kidney stone suspected. History of pancreatitis. EXAM: CT ABDOMEN AND PELVIS WITHOUT CONTRAST TECHNIQUE: Multidetector CT imaging of the abdomen and pelvis was performed following the standard protocol without IV contrast. RADIATION DOSE REDUCTION: This exam was performed according to the departmental dose-optimization program which includes automated exposure control, adjustment of the mA and/or kV according to patient  size and/or use of iterative reconstruction technique. COMPARISON:  Abdominopelvic CT 06/07/2021 and 06/01/2021. FINDINGS: Lower chest: Clear lung bases. No significant pleural or pericardial effusion. Hepatobiliary: There is decreased hepatic density consistent with steatosis. No focal abnormalities are identified on noncontrast imaging. Persistent pneumobilia status post cholecystectomy. No evidence of significant biliary dilatation. Pancreas: Unremarkable. No pancreatic ductal dilatation or surrounding inflammatory changes. Spleen: Normal in size without focal abnormality. Adrenals/Urinary Tract: Both adrenal glands appear normal. Possible punctate renal calculi bilaterally. No evidence of ureteral calculus, hydronephrosis or perinephric soft tissue stranding. The bladder appears unremarkable for its degree of distention. Stomach/Bowel: No enteric contrast administered. The stomach appears unremarkable for its degree of distension. No evidence of bowel wall thickening,  distention or surrounding inflammatory change. There are surgical clips adjacent to the cecum consistent with previous appendectomy. Vascular/Lymphatic: There are no enlarged abdominal or pelvic lymph nodes. Small ileocolonic mesenteric lymph nodes are stable, likely reactive. Minimal aortic atherosclerosis without evidence of acute vascular findings on noncontrast imaging. Reproductive: Prostatic calcifications are noted without surrounding inflammation. Other: No evidence of abdominal wall mass or hernia. No ascites. Musculoskeletal: No acute or significant osseous findings. IMPRESSION: 1. No evidence of ureteral calculus or hydronephrosis. Possible punctate renal calculi bilaterally. 2. No evidence of acute pancreatitis or other acute findings to account for the patient's symptoms. 3. Pneumobilia status post cholecystectomy. Electronically Signed   By: Richardean Sale M.D.   On: 07/25/2021 08:23    Procedures Procedures    Medications  Ordered in ED Medications  sodium chloride 0.9 % bolus 1,000 mL (1,000 mLs Intravenous New Bag/Given 07/25/21 0837)  fentaNYL (SUBLIMAZE) injection 50 mcg (50 mcg Intravenous Given 07/25/21 9767)    ED Course/ Medical Decision Making/ A&P                           Medical Decision Making Amount and/or Complexity of Data Reviewed Labs: ordered. Radiology: ordered.  Risk Prescription drug management.   Leda Gauze is here with right-sided abdominal pain.  History of pancreatitis, kidney stones, chronic pain in abdomen.  Unremarkable vitals.  No fever.  Pain for the last 3 days.  Tender in the right flank and right upper quadrant.  Differential diagnosis includes pancreatitis versus cholecystitis versus kidney stone versus less likely bowel obstruction.  We will get CBC, CMP, lipase, urinalysis, CT scan abdomen and pelvis.  Will give IV fentanyl, IV Zofran, IV fluids and reevaluate.  Per my review and interpretation of labs there are no significant anemia, electrolyte abnormality, kidney injury.  Gallbladder and liver enzymes within normal limits.  CT report per my review shows no acute findings.  No kidney stones.  Patient feeling better.  Will treat with Carafate.  Discharged in good condition.  This chart was dictated using voice recognition software.  Despite best efforts to proofread,  errors can occur which can change the documentation meaning.         Final Clinical Impression(s) / ED Diagnoses Final diagnoses:  Epigastric pain    Rx / DC Orders ED Discharge Orders          Ordered    sucralfate (CARAFATE) 1 g tablet  3 times daily with meals & bedtime        07/25/21 [place], [place], DO 07/25/21 3419

## 2021-07-25 NOTE — ED Notes (Signed)
Pt in bed, pt reports decreased pain, pt states that he is ready to go home

## 2021-07-26 ENCOUNTER — Other Ambulatory Visit: Payer: Self-pay | Admitting: Family Medicine

## 2021-07-26 DIAGNOSIS — J309 Allergic rhinitis, unspecified: Secondary | ICD-10-CM

## 2021-07-30 IMAGING — CR DG CHEST 2V
2 series · 2 of 2 positions shown · non-contrast
Comparison: September 10, 2017

CLINICAL DATA: Fever of unknown origin, admitted for pancreatitis

EXAM:
CHEST - 2 VIEW

[w chest pa]
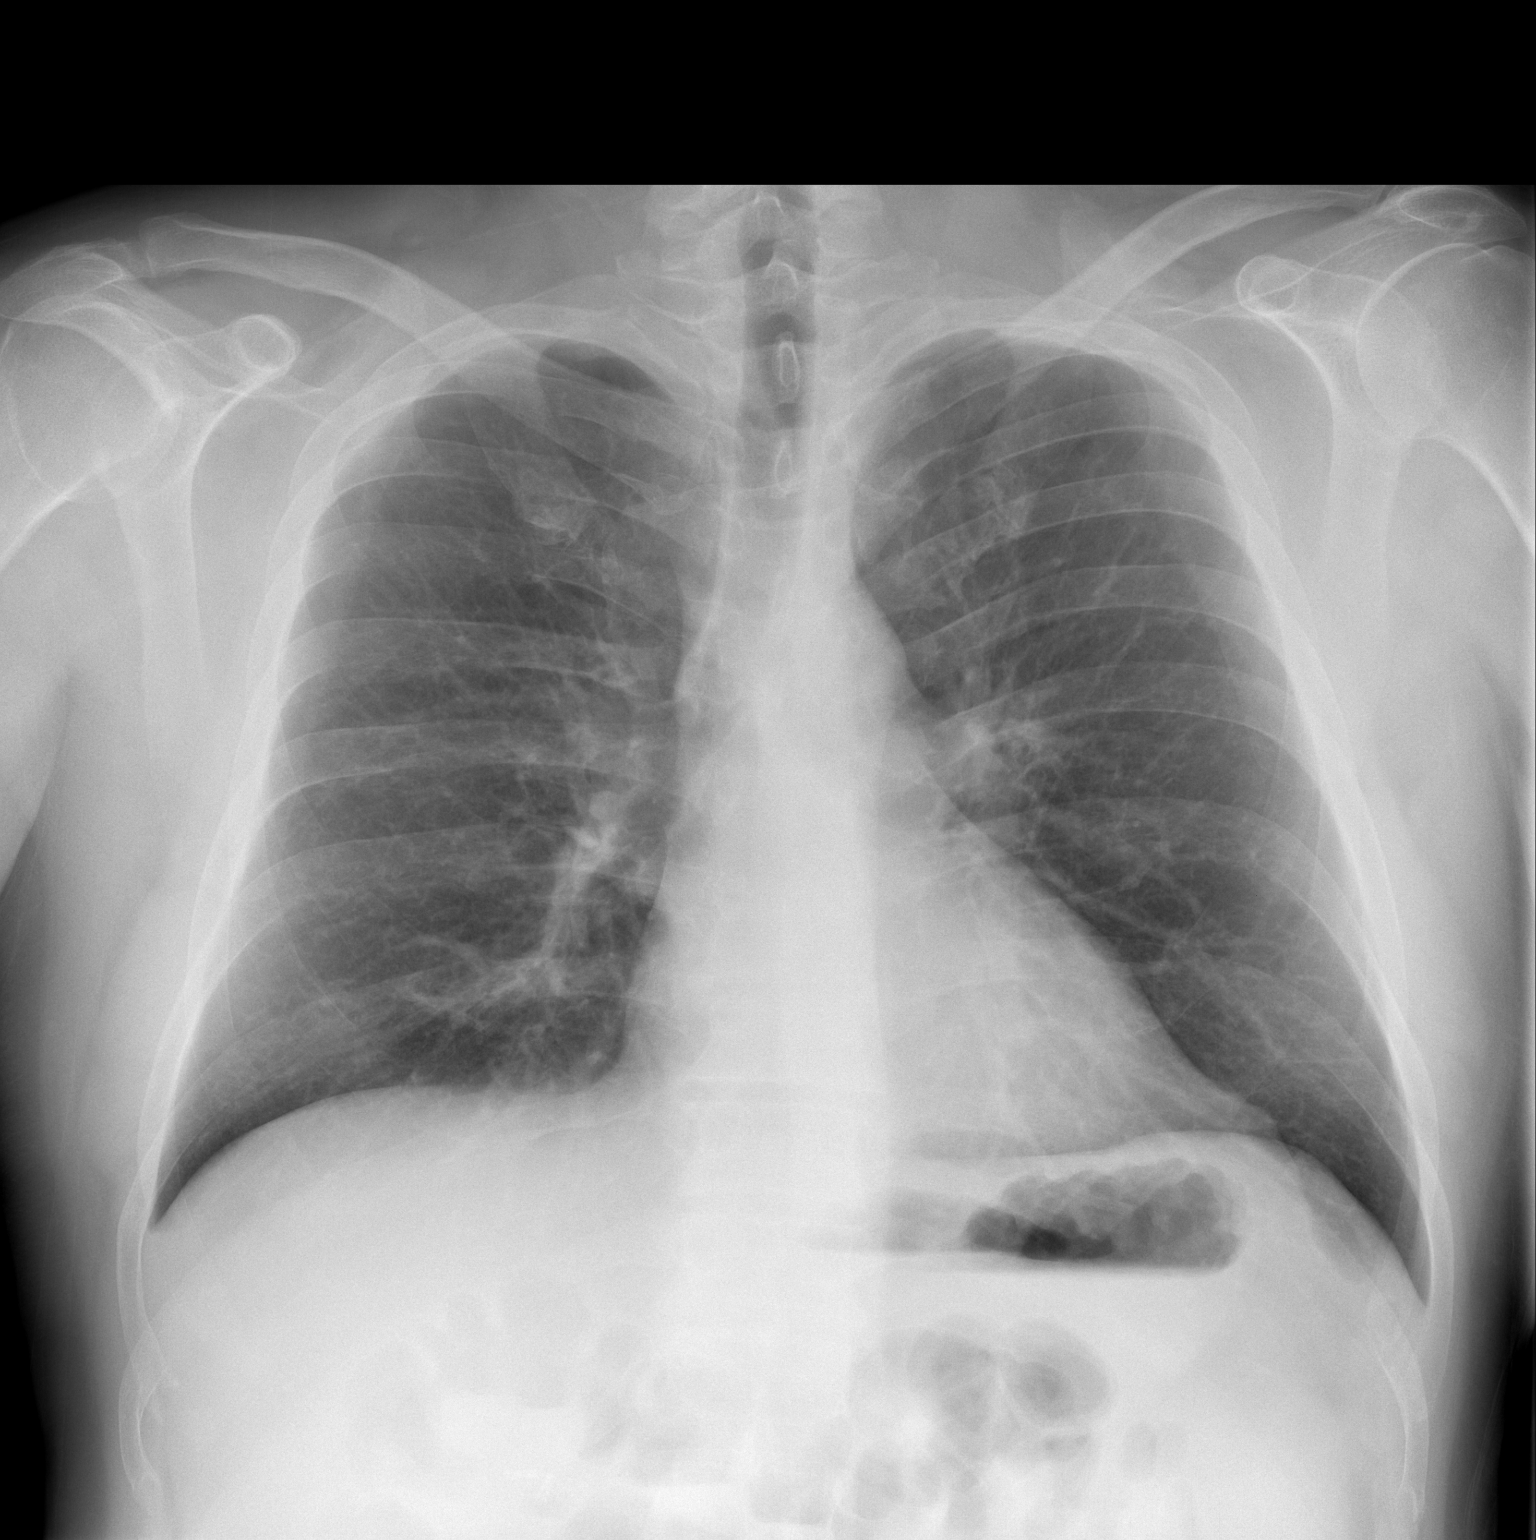

[w chest lat]
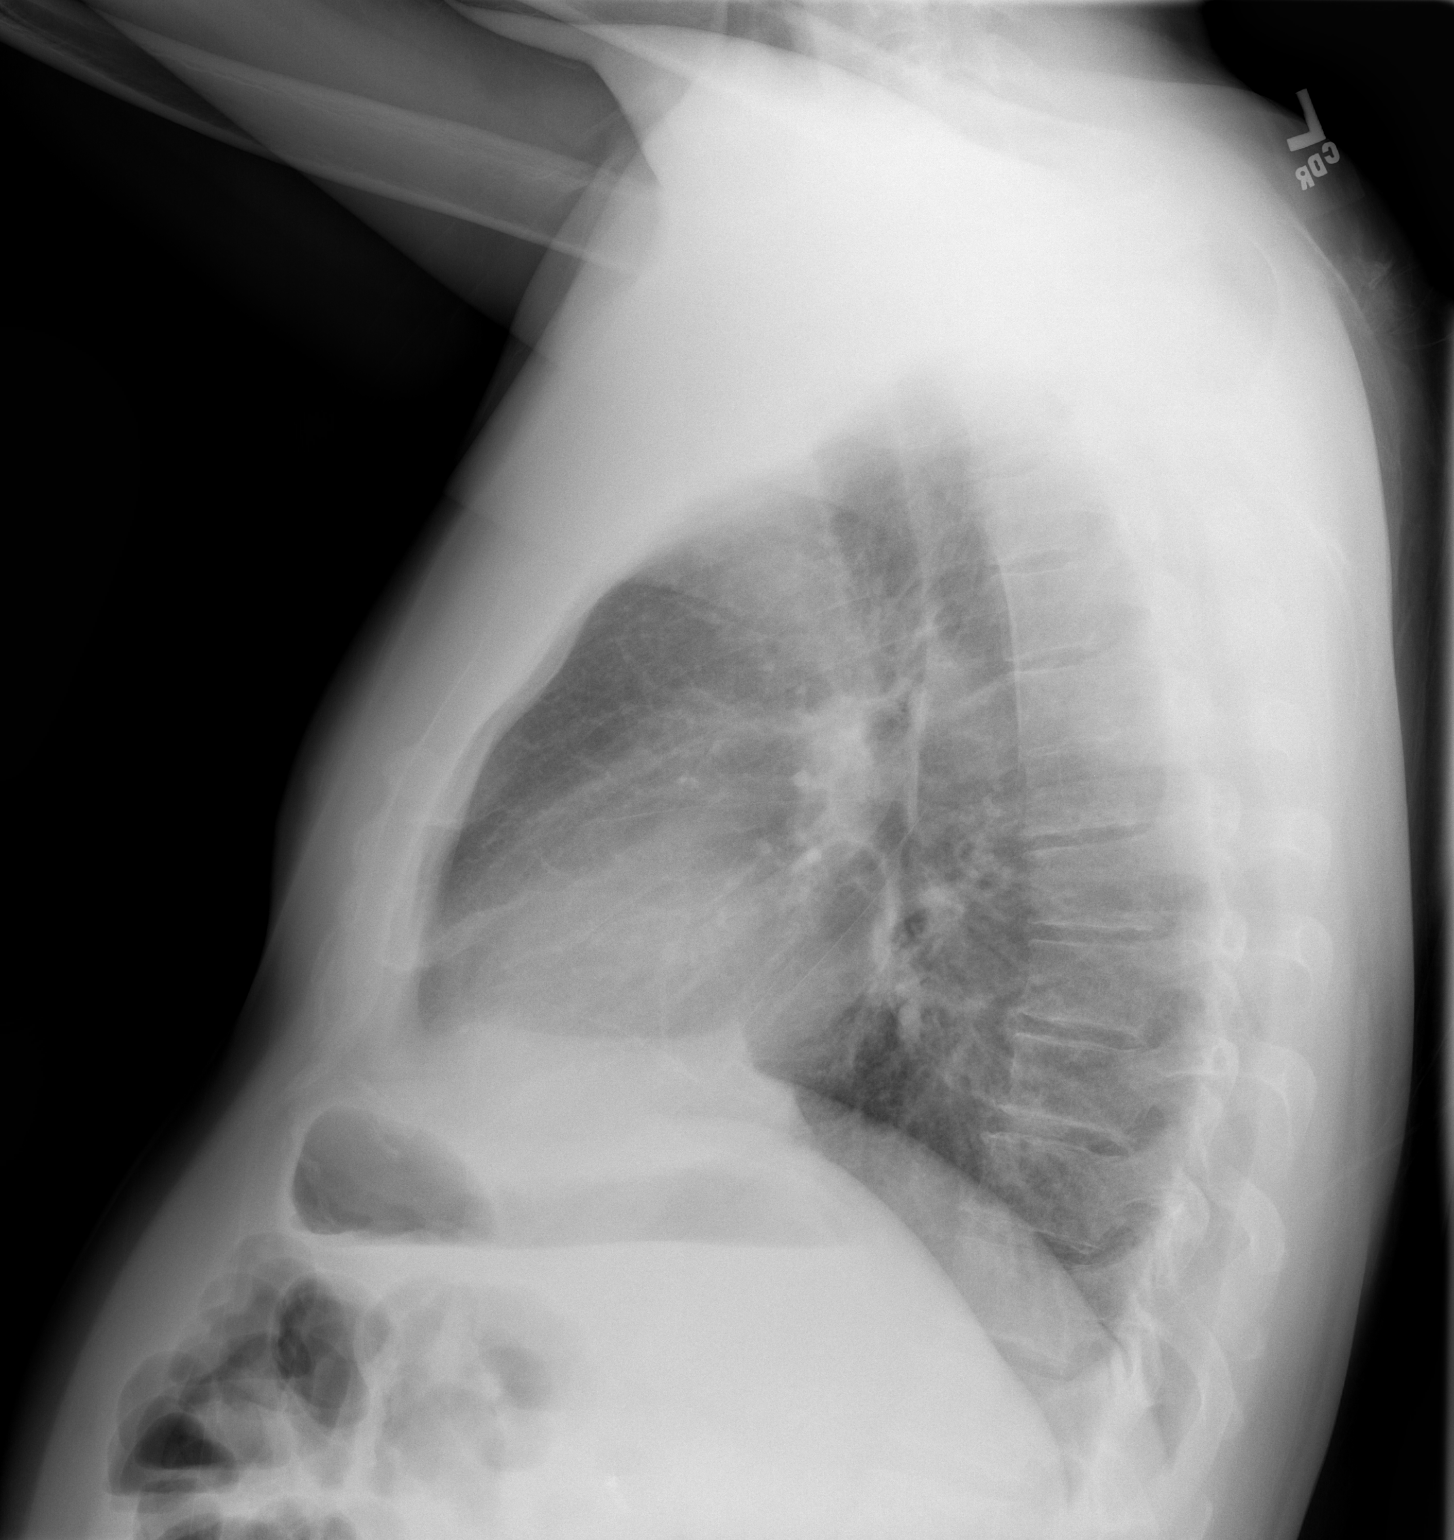

[2 of 2 positions shown; findings below may reference images not displayed]

FINDINGS: Trachea midline. Cardiomediastinal contours and hilar structures are
normal.

Lungs are clear.  No signs of pleural effusion.

Visualized skeletal structures on limited assessment are
unremarkable.
IMPRESSION: No acute cardiopulmonary disease.

## 2021-08-04 ENCOUNTER — Emergency Department (HOSPITAL_BASED_OUTPATIENT_CLINIC_OR_DEPARTMENT_OTHER)
Admission: EM | Admit: 2021-08-04 | Discharge: 2021-08-04 | Disposition: A | Discharge status: Home / Self Care | Payer: No Typology Code available for payment source | Attending: Emergency Medicine | Admitting: Emergency Medicine

## 2021-08-04 ENCOUNTER — Encounter (HOSPITAL_BASED_OUTPATIENT_CLINIC_OR_DEPARTMENT_OTHER): Payer: Self-pay

## 2021-08-04 ENCOUNTER — Other Ambulatory Visit: Payer: Self-pay

## 2021-08-04 DIAGNOSIS — R682 Dry mouth, unspecified: Secondary | ICD-10-CM | POA: Insufficient documentation

## 2021-08-04 DIAGNOSIS — G8929 Other chronic pain: Secondary | ICD-10-CM | POA: Insufficient documentation

## 2021-08-04 DIAGNOSIS — R110 Nausea: Secondary | ICD-10-CM | POA: Diagnosis not present

## 2021-08-04 DIAGNOSIS — R109 Unspecified abdominal pain: Secondary | ICD-10-CM

## 2021-08-04 DIAGNOSIS — R1012 Left upper quadrant pain: Secondary | ICD-10-CM | POA: Insufficient documentation

## 2021-08-04 MED ORDER — ONDANSETRON 4 MG PO TBDP
4.0000 mg | ORAL_TABLET | Freq: Once | ORAL | Status: AC
  Administered 2021-08-04: 4 mg via ORAL
  Filled 2021-08-04: qty 1

## 2021-08-04 MED ORDER — HYDROMORPHONE HCL 1 MG/ML IJ SOLN
1.0000 mg | Freq: Once | INTRAMUSCULAR | Status: AC
  Administered 2021-08-04: 1 mg via INTRAMUSCULAR
  Filled 2021-08-04: qty 1

## 2021-08-04 NOTE — ED Triage Notes (Signed)
Patient complains of LUQ abdominal pain which began about 12 hours ago.  States he was able to eat dinner around 1930 and then began experiencing nausea and vomiting x4-5 episodes around 2130.  Has appointment set up with pain management specialist but has not been seen by them yet.  No medications taken prior to arrival. ?

## 2021-08-04 NOTE — ED Provider Notes (Signed)
? ?[facility]  ?Provider Note ? ?CSN: [id number] ?Arrival date & time: 08/04/21 0139 ? ?History ?Chief Complaint  ?Patient presents with  ? Abdominal Pain  ? ? ?[name] is a 46 y.o. male with history of chronic abdominal pain with frequent ED visits is here for same. He reports onset of pain about 12 hours ago, has had several episodes of vomiting during the evening. Similar to previous presentations. He was previously getting opiate Rx from PCP but she has stopped writing those with the expectation he would be establishing with a pain management clinic however he reports his initial appointment has been rescheduled several times and is now to be done later this month. He has phenergan PO at home.  ? ? ?Home Medications ?Prior to Admission medications   ?Medication Sig Start Date End Date Taking? Authorizing Provider  ?amLODipine (NORVASC) 5 MG tablet Take 1 tablet (5 mg total) by mouth daily. 04/29/21   [place], Betty G, MD  ?benazepril (LOTENSIN) 40 MG tablet Take 1 tablet (40 mg total) by mouth daily. 04/29/21   [place], Betty G, MD  ?fluticasone (FLONASE) 50 MCG/ACT nasal spray PLACE 1 SPRAY INTO BOTH NOSTRILS 2 (TWO) TIMES DAILY 07/26/21   [place], Betty G, MD  ?KLOR-CON M20 20 MEQ tablet TAKE 1 TABLET BY MOUTH EVERY DAY 07/04/21   [place], Betty G, MD  ?metoprolol succinate (TOPROL-XL) 100 MG 24 hr tablet TAKE 1 TABLET BY MOUTH DAILY. TAKE WITH OR IMMEDIATELY FOLLOWING A MEAL. 05/01/21   [place], Betty G, MD  ?naloxone [facility]) nasal spray 4 mg/0.1 mL 1 spray in each nostril x 1 if opioid overdose. 06/08/20   [place], Betty G, MD  ?ondansetron (ZOFRAN ODT) 4 MG disintegrating tablet Place 1 tablet under the tongue every 12 hours PRN nausea 06/16/21   Hayden Rasmussen, MD  ?pantoprazole (PROTONIX) 40 MG tablet Take 1 tablet (40 mg total) by mouth daily. ?Patient taking differently: Take 40 mg by mouth daily as needed (heart burn). 12/25/20   Lajean Saver, MD  ?promethazine (PHENERGAN)  25 MG suppository Place 1 suppository (25 mg total) rectally every 6 (six) hours as needed for nausea or vomiting. 96/29/52   Delora Fuel, MD  ?promethazine (PHENERGAN) 25 MG tablet Take 1 tablet (25 mg total) by mouth every 6 (six) hours as needed. 84/13/24   Delora Fuel, MD  ?promethazine (PHENERGAN) 25 MG tablet Take 1 tablet (25 mg total) by mouth every 6 (six) hours as needed for nausea or vomiting. 07/12/21   Irene Shipper, MD  ?rosuvastatin (CRESTOR) 10 MG tablet Take 1 tablet (10 mg total) by mouth daily. 05/23/21   [place], Betty G, MD  ?sucralfate (CARAFATE) 1 g tablet Take 1 tablet (1 g total) by mouth 4 (four) times daily -  with meals and at bedtime for 14 days. 07/25/21 08/08/21  Lennice Sites, DO  ? ? ? ?Allergies    ?Cortisone, Eggs or egg-derived products, Ketorolac, Ketorolac tromethamine, Haldol [haloperidol lactate], Haloperidol, Hydrocortisone, Iodinated contrast media, Ketorolac tromethamine, Reglan [metoclopramide], Compazine [prochlorperazine], and Droperidol ? ? ?Review of Systems   ?Review of Systems ?Please see HPI for pertinent positives and negatives ? ?Physical Exam ?BP (!) 171/114 (BP Location: Right Arm)   Pulse 90   Temp 98.5 ?F (36.9 ?C) (Oral)   Resp 18   Ht '6\' 1"'$  (1.854 m)   Wt 95.3 kg   SpO2 96%   BMI 27.72 kg/m?  ? ?Physical Exam ?Vitals and nursing note reviewed.  ?  Constitutional:   ?   Appearance: Normal appearance.  ?HENT:  ?   Head: Normocephalic and atraumatic.  ?   Nose: Nose normal.  ?   Mouth/Throat:  ?   Mouth: Mucous membranes are dry.  ?Eyes:  ?   Extraocular Movements: Extraocular movements intact.  ?   Conjunctiva/sclera: Conjunctivae normal.  ?Cardiovascular:  ?   Rate and Rhythm: Normal rate.  ?Pulmonary:  ?   Effort: Pulmonary effort is normal.  ?   Breath sounds: Normal breath sounds.  ?Abdominal:  ?   General: Abdomen is flat.  ?   Palpations: Abdomen is soft.  ?   Tenderness: There is abdominal tenderness in the left upper quadrant. There is no guarding.  Negative signs include Murphy's sign and McBurney's sign.  ?Musculoskeletal:     ?   General: No swelling. Normal range of motion.  ?   Cervical back: Neck supple.  ?Skin: ?   General: Skin is warm and dry.  ?Neurological:  ?   General: No focal deficit present.  ?   Mental Status: He is alert.  ?Psychiatric:     ?   Mood and Affect: Mood normal.  ? ? ?ED Results / Procedures / Treatments   ?EKG ?None ? ?Procedures ?Procedures ? ?Medications Ordered in the ED ?Medications  ?HYDROmorphone (DILAUDID) injection 1 mg (1 mg Intramuscular Given 08/04/21 0203)  ?ondansetron (ZOFRAN-ODT) disintegrating tablet 4 mg (4 mg Oral Given 08/04/21 0159)  ? ? ?Initial Impression and Plan ? Patient here with exacerbation of his chronic pain. He has had numerous lab tests and CT scans (7 in the last 6 months) all of which have been unremarkable. No indication to repeat those tonight. Offered IM pain medications and ODT zofran for symptom control.  ? ?ED Course  ? ?Clinical Course as of 08/04/21 0235  ?Fri Aug 04, 2021  ?0233 Patient reports he is feeling better, no vomiting while in the ED and he is tolerating PO fluids. He is comfortable going home and managing his symptoms there.  [CS]  ?  ?Clinical Course User Index ?[CS] Truddie Hidden, MD  ? ? ? ?MDM Rules/Calculators/A&P ?Medical Decision Making ?Problems Addressed: ?Chronic abdominal pain: chronic illness or injury ? ?Amount and/or Complexity of Data Reviewed ?External Data Reviewed: labs, radiology and notes. ? ?Risk ?Prescription drug management. ?Parenteral controlled substances. ? ? ? ?Final Clinical Impression(s) / ED Diagnoses ?Final diagnoses:  ?Chronic abdominal pain  ? ? ?Rx / DC Orders ?ED Discharge Orders   ? ? None  ? ?  ? ?  ?Truddie Hidden, MD ?08/04/21 [phone] ? ?

## 2021-08-07 ENCOUNTER — Encounter: Payer: Self-pay | Admitting: Family Medicine

## 2021-08-07 ENCOUNTER — Ambulatory Visit (INDEPENDENT_AMBULATORY_CARE_PROVIDER_SITE_OTHER): Payer: No Typology Code available for payment source | Admitting: Family Medicine

## 2021-08-07 VITALS — BP 128/80 | HR 87 | Resp 16 | Ht 73.0 in | Wt 210.1 lb

## 2021-08-07 DIAGNOSIS — I10 Essential (primary) hypertension: Secondary | ICD-10-CM

## 2021-08-07 DIAGNOSIS — G894 Chronic pain syndrome: Secondary | ICD-10-CM

## 2021-08-07 DIAGNOSIS — E876 Hypokalemia: Secondary | ICD-10-CM

## 2021-08-07 NOTE — Assessment & Plan Note (Addendum)
Chronic abdominal pain. ?He has weaned off Oxycodone. ?Provider he recently saw is not in his health insurance net work, so new referral placed. ?

## 2021-08-07 NOTE — Assessment & Plan Note (Signed)
BP adequately controlled. ?Continue Benazepril 40 mg daily, Amlodipine 5 mg daily,and Metoprolol succinate 100 mg daily. ?Continue monitoring BP at home. ?Low salt/DASH diet recommended. ?

## 2021-08-07 NOTE — Assessment & Plan Note (Signed)
Mild. Usually when he presents to the ER with nausea and vomiting. ?Continue KLOR 20 meq daily. ?

## 2021-08-07 NOTE — Progress Notes (Signed)
? ?[name] is a 46 y.o.male, who is here today to follow on HTN. ? ?Last follow up visit: 07/04/21 for ER follow-up. ?Since his last visit he has followed with pain management, chronic abdominal pain.Unfortunately provider is not under his health insurance network.  He has a list of providers that are covered and needs a new referral. ? ?He is no longer on oxycodone. ?Last ER visit on 08/04/2021 for abdominal pain,nausea,and vomiting. ?He has also seen his GI, Dr [name] on 07/12/21. ? ?Hypertension:  ?Medications: Amlodipine 5 mg daily and Metoprolol Succinate 100 mg daily. ?BP readings at home:If he is not in pain BP readings are 120's/70-80's. If in pain BP is elevated, last night it was 150/98 while he was at work and having abdominal pain. ? ?Side effects:none ?Negative for unusual or severe headache, visual changes, exertional chest pain, dyspnea,  focal weakness, or edema. ? ?Hypokalemia aggravated by vomiting. ?He is on K-Lor 20 mEq daily. ? ?Lab Results  ?Component Value Date  ? CREATININE 0.81 07/25/2021  ? BUN 15 07/25/2021  ? NA 138 07/25/2021  ? K 3.4 (L) 07/25/2021  ? CL 103 07/25/2021  ? CO2 26 07/25/2021  ? ?He has gained some wt since his last visit. ?He has not changed his dietary habits, wonders if Crestor could be causing wt gain. ?Just started walking at work, around campus if weather permits it and for about 30 min. ? ?Review of Systems  ?Constitutional:  Negative for activity change, appetite change and fever.  ?HENT:  Negative for nosebleeds and sore throat.   ?Respiratory:  Negative for cough and wheezing.   ?Gastrointestinal:   ?     Negative for changes in bowel habits.  ?Genitourinary:  Negative for decreased urine volume and hematuria.  ?Musculoskeletal:  Negative for gait problem and myalgias.  ?Neurological:  Negative for syncope, facial asymmetry and weakness.  ?Rest see pertinent positives and negatives per HPI. ? ?Current Outpatient Medications on File Prior to Visit   ?Medication Sig Dispense Refill  ? amLODipine (NORVASC) 5 MG tablet Take 1 tablet (5 mg total) by mouth daily. 90 tablet 1  ? benazepril (LOTENSIN) 40 MG tablet Take 1 tablet (40 mg total) by mouth daily. 90 tablet 1  ? fluticasone (FLONASE) 50 MCG/ACT nasal spray PLACE 1 SPRAY INTO BOTH NOSTRILS 2 (TWO) TIMES DAILY 16 mL 2  ? KLOR-CON M20 20 MEQ tablet TAKE 1 TABLET BY MOUTH EVERY DAY 90 tablet 0  ? metoprolol succinate (TOPROL-XL) 100 MG 24 hr tablet TAKE 1 TABLET BY MOUTH DAILY. TAKE WITH OR IMMEDIATELY FOLLOWING A MEAL. 90 tablet 1  ? ondansetron (ZOFRAN ODT) 4 MG disintegrating tablet Place 1 tablet under the tongue every 12 hours PRN nausea 20 tablet 0  ? promethazine (PHENERGAN) 25 MG tablet Take 1 tablet (25 mg total) by mouth every 6 (six) hours as needed for nausea or vomiting. 30 tablet 1  ? rosuvastatin (CRESTOR) 10 MG tablet Take 1 tablet (10 mg total) by mouth daily. 30 tablet 3  ? ?No current facility-administered medications on file prior to visit.  ? ?Past Medical History:  ?Diagnosis Date  ? Chronic abdominal pain   ? Diverticulitis   ? Drug-seeking behavior   ? Hypertension   ? Kidney stones   ? Pancreatitis, chronic ([place]) 05/28/2005  ? ?Allergies  ?Allergen Reactions  ? Cortisone Other (See Comments)  ?  drops potassium level ?"bottoms out" potassium level ?drops potassium level ?Bottoms out potassium ? ?"bottoms  out" potassium level ?Other reaction(s): Other (See Comments) ?Bottoms out potassium  ? Eggs Or Egg-Derived Products Hives and Other (See Comments)  ?  Rash ? ?Rash  ? Ketorolac Hives and Other (See Comments)  ?  Hives, slightly labored breathing ? ?Hives, slightly labored breathing  ? Ketorolac Tromethamine Hives and Shortness Of Breath  ? Haldol [Haloperidol Lactate] Other (See Comments)  ?  "jittery" ?  ? Haloperidol Other (See Comments)  ?  "jittery" ?"jittery" ?  ? Hydrocortisone Hives  ?  Also drops potassium level  ? Iodinated Contrast Media Nausea And Vomiting and Other (See  Comments)  ?  Hives ?Hives ?Hives ?Hives  ? Ketorolac Tromethamine Hives  ? Reglan [Metoclopramide]   ?  Face "draws" and gets figgety  ? Compazine [Prochlorperazine] Other (See Comments)  ?  Dystonic reaction  ? Droperidol Other (See Comments)  ?  Makes face draw up  ? ?Social History  ? ?Socioeconomic History  ? Marital status: Married  ?  Spouse name: Not on file  ? Number of children: 1  ? Years of education: Not on file  ? Highest education level: Some college, no degree  ?Occupational History  ? Not on file  ?Tobacco Use  ? Smoking status: Every Day  ?  Packs/day: 0.50  ?  Types: Cigarettes  ? Smokeless tobacco: Never  ?Vaping Use  ? Vaping Use: Never used  ?Substance and Sexual Activity  ? Alcohol use: Not Currently  ? Drug use: No  ? Sexual activity: Not on file  ?Other Topics Concern  ? Not on file  ?Social History Narrative  ? Not on file  ? ?Social Determinants of Health  ? ?Financial Resource Strain: Low Risk   ? Difficulty of Paying Living Expenses: Not hard at all  ?Food Insecurity: No Food Insecurity  ? Worried About Charity fundraiser in the Last Year: Never true  ? Ran Out of Food in the Last Year: Never true  ?Transportation Needs: No Transportation Needs  ? Lack of Transportation (Medical): No  ? Lack of Transportation (Non-Medical): No  ?Physical Activity: Insufficiently Active  ? Days of Exercise per Week: 1 day  ? Minutes of Exercise per Session: 20 min  ?Stress: No Stress Concern Present  ? Feeling of Stress : Only a little  ?Social Connections: Socially Integrated  ? Frequency of Communication with Friends and Family: Three times a week  ? Frequency of Social Gatherings with Friends and Family: Once a week  ? Attends Religious Services: More than 4 times per year  ? Active Member of Clubs or Organizations: Yes  ? Attends Archivist Meetings: More than 4 times per year  ? Marital Status: Married  ? ?Vitals:  ? 08/07/21 0913  ?BP: 128/80  ?Pulse: 87  ?Resp: 16  ?SpO2: 97%  ? ?Wt  Readings from Last 3 Encounters:  ?08/07/21 210 lb 1.6 oz (95.3 kg)  ?08/04/21 210 lb 1.6 oz (95.3 kg)  ?07/25/21 210 lb (95.3 kg)  ? ?Body mass index is 27.72 kg/m?. ? ?Physical Exam ?Nursing note reviewed.  ?Constitutional:   ?   General: He is not in acute distress. ?   Appearance: He is well-developed.  ?HENT:  ?   Head: Normocephalic and atraumatic.  ?Eyes:  ?   Conjunctiva/sclera: Conjunctivae normal.  ?Cardiovascular:  ?   Rate and Rhythm: Normal rate and regular rhythm.  ?   Pulses:     ?     Dorsalis pedis  pulses are 2+ on the right side and 2+ on the left side.  ?   Heart sounds: No murmur heard. ?Pulmonary:  ?   Effort: Pulmonary effort is normal. No respiratory distress.  ?   Breath sounds: Normal breath sounds.  ?Abdominal:  ?   Palpations: Abdomen is soft. There is no hepatomegaly or mass.  ?   Tenderness: There is no abdominal tenderness.  ?Lymphadenopathy:  ?   Cervical: No cervical adenopathy.  ?Skin: ?   General: Skin is warm.  ?   Findings: No erythema or rash.  ?Neurological:  ?   Mental Status: He is alert and oriented to person, place, and time.  ?   Cranial Nerves: No cranial nerve deficit.  ?   Gait: Gait normal.  ?Psychiatric:  ?   Comments: Well groomed, good eye contact.  ? ?ASSESSMENT AND PLAN:  ? ?Mr.Dallon was seen today for follow-up. ? ?Diagnoses and all orders for this visit: ? ?Orders Placed This Encounter  ?Procedures  ? Ambulatory referral to Pain Clinic  ? ?Hypokalemia ?Mild. Usually when he presents to the ER with nausea and vomiting. ?Continue KLOR 20 meq daily. ? ?Hypertension, essential, benign ?BP adequately controlled. ?Continue Benazepril 40 mg daily, Amlodipine 5 mg daily,and Metoprolol succinate 100 mg daily. ?Continue monitoring BP at home. ?Low salt/DASH diet recommended. ? ?Chronic pain disorder ?Chronic abdominal pain. ?He has weaned off Oxycodone. ?Provider he recently saw is not in his health insurance net work, so new referral placed. ? ?Return in about 5 months  (around 01/07/2022) for HTN and HLD. ? ?Jalayne Ganesh G. Martinique, MD ? ?[place]. ?[place] office. ? ?

## 2021-08-07 NOTE — Patient Instructions (Addendum)
A few things to remember from today's visit:  Hypertension, essential, benign  Chronic pain disorder - Plan: Ambulatory referral to Pain Clinic  If you need refills please call your pharmacy. Do not use My Chart to request refills or for acute issues that need immediate attention.   Please be sure medication list is accurate. If a new problem present, please set up appointment sooner than planned today. No changes today. Continue daily walking 15-30 min. DASH Eating Plan DASH stands for Dietary Approaches to Stop Hypertension. The DASH eating plan is a healthy eating plan that has been shown to: Reduce high blood pressure (hypertension). Reduce your risk for type 2 diabetes, heart disease, and stroke. Help with weight loss. What are tips for following this plan? Reading food labels Check food labels for the amount of salt (sodium) per serving. Choose foods with less than 5 percent of the Daily Value of sodium. Generally, foods with less than 300 milligrams (mg) of sodium per serving fit into this eating plan. To find whole grains, look for the word "whole" as the first word in the ingredient list. Shopping Buy products labeled as "low-sodium" or "no salt added." Buy fresh foods. Avoid canned foods and pre-made or frozen meals. Cooking Avoid adding salt when cooking. Use salt-free seasonings or herbs instead of table salt or sea salt. Check with your health care provider or pharmacist before using salt substitutes. Do not fry foods. Cook foods using healthy methods such as baking, boiling, grilling, roasting, and broiling instead. Cook with heart-healthy oils, such as olive, canola, avocado, soybean, or sunflower oil. Meal planning  Eat a balanced diet that includes: 4 or more servings of fruits and 4 or more servings of vegetables each day. Try to fill one-half of your plate with fruits and vegetables. 6-8 servings of whole grains each day. Less than 6 oz (170 g) of lean meat,  poultry, or fish each day. A 3-oz (85-g) serving of meat is about the same size as a deck of cards. One egg equals 1 oz (28 g). 2-3 servings of low-fat dairy each day. One serving is 1 cup (237 mL). 1 serving of nuts, seeds, or beans 5 times each week. 2-3 servings of heart-healthy fats. Healthy fats called omega-3 fatty acids are found in foods such as walnuts, flaxseeds, fortified milks, and eggs. These fats are also found in cold-water fish, such as sardines, salmon, and mackerel. Limit how much you eat of: Canned or prepackaged foods. Food that is high in trans fat, such as some fried foods. Food that is high in saturated fat, such as fatty meat. Desserts and other sweets, sugary drinks, and other foods with added sugar. Full-fat dairy products. Do not salt foods before eating. Do not eat more than 4 egg yolks a week. Try to eat at least 2 vegetarian meals a week. Eat more home-cooked food and less restaurant, buffet, and fast food. Lifestyle When eating at a restaurant, ask that your food be prepared with less salt or no salt, if possible. If you drink alcohol: Limit how much you use to: 0-1 drink a day for women who are not pregnant. 0-2 drinks a day for men. Be aware of how much alcohol is in your drink. In the [place] one drink equals one 12 oz bottle of beer (355 mL), one 5 oz glass of wine (148 mL), or one 1 oz glass of hard liquor (44 mL). General information Avoid eating more than 2,300 mg of salt a day.  If you have hypertension, you may need to reduce your sodium intake to 1,500 mg a day. Work with your health care provider to maintain a healthy body weight or to lose weight. Ask what an ideal weight is for you. Get at least 30 minutes of exercise that causes your heart to beat faster (aerobic exercise) most days of the week. Activities may include walking, swimming, or biking. Work with your health care provider or dietitian to adjust your eating plan to your individual calorie  needs. What foods should I eat? Fruits All fresh, dried, or frozen fruit. Canned fruit in natural juice (without added sugar). Vegetables Fresh or frozen vegetables (raw, steamed, roasted, or grilled). Low-sodium or reduced-sodium tomato and vegetable juice. Low-sodium or reduced-sodium tomato sauce and tomato paste. Low-sodium or reduced-sodium canned vegetables. Grains Whole-grain or whole-wheat bread. Whole-grain or whole-wheat pasta. Brown rice. [name]. Bulgur. Whole-grain and low-sodium cereals. Pita bread. Low-fat, low-sodium crackers. Whole-wheat flour tortillas. Meats and other proteins Skinless chicken or [place]. Ground chicken or [place]. Pork with fat trimmed off. Fish and seafood. Egg whites. Dried beans, peas, or lentils. Unsalted nuts, nut butters, and seeds. Unsalted canned beans. Lean cuts of beef with fat trimmed off. Low-sodium, lean precooked or cured meat, such as sausages or meat loaves. Dairy Low-fat (1%) or fat-free (skim) milk. Reduced-fat, low-fat, or fat-free cheeses. Nonfat, low-sodium ricotta or cottage cheese. Low-fat or nonfat yogurt. Low-fat, low-sodium cheese. Fats and oils Soft margarine without trans fats. Vegetable oil. Reduced-fat, low-fat, or light mayonnaise and salad dressings (reduced-sodium). Canola, safflower, olive, avocado, soybean, and sunflower oils. Avocado. Seasonings and condiments Herbs. Spices. Seasoning mixes without salt. Other foods Unsalted popcorn and pretzels. Fat-free sweets. The items listed above may not be a complete list of foods and beverages you can eat. Contact a dietitian for more information. What foods should I avoid? Fruits Canned fruit in a light or heavy syrup. Fried fruit. Fruit in cream or butter sauce. Vegetables Creamed or fried vegetables. Vegetables in a cheese sauce. Regular canned vegetables (not low-sodium or reduced-sodium). Regular canned tomato sauce and paste (not low-sodium or reduced-sodium). Regular  tomato and vegetable juice (not low-sodium or reduced-sodium). [name]. Olives. Grains Baked goods made with fat, such as croissants, muffins, or some breads. Dry pasta or rice meal packs. Meats and other proteins Fatty cuts of meat. Ribs. Fried meat. Berniece Salines. Bologna, salami, and other precooked or cured meats, such as sausages or meat loaves. Fat from the back of a pig (fatback). Bratwurst. Salted nuts and seeds. Canned beans with added salt. Canned or smoked fish. Whole eggs or egg yolks. Chicken or [place] with skin. Dairy Whole or 2% milk, cream, and half-and-half. Whole or full-fat cream cheese. Whole-fat or sweetened yogurt. Full-fat cheese. Nondairy creamers. Whipped toppings. Processed cheese and cheese spreads. Fats and oils Butter. Stick margarine. Lard. Shortening. Ghee. Bacon fat. Tropical oils, such as coconut, palm kernel, or palm oil. Seasonings and condiments Onion salt, garlic salt, seasoned salt, table salt, and sea salt. Worcestershire sauce. Tartar sauce. Barbecue sauce. Teriyaki sauce. Soy sauce, including reduced-sodium. Steak sauce. Canned and packaged gravies. Fish sauce. Oyster sauce. Cocktail sauce. Store-bought horseradish. Ketchup. Mustard. Meat flavorings and tenderizers. Bouillon cubes. Hot sauces. Pre-made or packaged marinades. Pre-made or packaged taco seasonings. Relishes. Regular salad dressings. Other foods Salted popcorn and pretzels. The items listed above may not be a complete list of foods and beverages you should avoid. Contact a dietitian for more information. Where to find more information National Heart, Lung,  and Blood Institute: https://wilson-eaton.com/ American Heart Association: www.heart.org Academy of Nutrition and Dietetics: www.eatright.[place]: www.kidney.org Summary The DASH eating plan is a healthy eating plan that has been shown to reduce high blood pressure (hypertension). It may also reduce your risk for type 2 diabetes,  heart disease, and stroke. When on the DASH eating plan, aim to eat more fresh fruits and vegetables, whole grains, lean proteins, low-fat dairy, and heart-healthy fats. With the DASH eating plan, you should limit salt (sodium) intake to 2,300 mg a day. If you have hypertension, you may need to reduce your sodium intake to 1,500 mg a day. Work with your health care provider or dietitian to adjust your eating plan to your individual calorie needs. This information is not intended to replace advice given to you by your health care provider. Make sure you discuss any questions you have with your health care provider. Document Revised: 04/17/2019 Document Reviewed: 04/17/2019 Elsevier Patient Education  2022 [employer].

## 2021-08-08 ENCOUNTER — Telehealth: Payer: Self-pay | Admitting: Family Medicine

## 2021-08-08 DIAGNOSIS — G894 Chronic pain syndrome: Secondary | ICD-10-CM

## 2021-08-08 NOTE — Telephone Encounter (Signed)
[name] with [facility] is calling to let office know they are not in network with pt insurance. They will not be able to see pt ?

## 2021-09-14 ENCOUNTER — Other Ambulatory Visit: Payer: Self-pay

## 2021-09-14 ENCOUNTER — Encounter (HOSPITAL_BASED_OUTPATIENT_CLINIC_OR_DEPARTMENT_OTHER): Payer: Self-pay | Admitting: Emergency Medicine

## 2021-09-14 ENCOUNTER — Emergency Department (HOSPITAL_BASED_OUTPATIENT_CLINIC_OR_DEPARTMENT_OTHER)
Admission: EM | Admit: 2021-09-14 | Discharge: 2021-09-15 | Disposition: A | Discharge status: Home / Self Care | Payer: No Typology Code available for payment source | Attending: Emergency Medicine | Admitting: Emergency Medicine

## 2021-09-14 DIAGNOSIS — R1013 Epigastric pain: Secondary | ICD-10-CM | POA: Insufficient documentation

## 2021-09-14 DIAGNOSIS — R110 Nausea: Secondary | ICD-10-CM | POA: Insufficient documentation

## 2021-09-14 NOTE — ED Triage Notes (Signed)
Pt is c/o abd pain with nausea   Pt states pain started yesterday and has gotten worse since lunch today  Pain is in the mid to upper left side   ?

## 2021-09-15 ENCOUNTER — Encounter (HOSPITAL_BASED_OUTPATIENT_CLINIC_OR_DEPARTMENT_OTHER): Payer: Self-pay | Admitting: Emergency Medicine

## 2021-09-15 LAB — CBC WITH DIFFERENTIAL/PLATELET
Abs Immature Granulocytes: 0.05 10*3/uL (ref 0.00–0.07)
Basophils Absolute: 0.1 10*3/uL (ref 0.0–0.1)
Basophils Relative: 1 %
Eosinophils Absolute: 0.3 10*3/uL (ref 0.0–0.5)
Eosinophils Relative: 3 %
HCT: 40.5 % (ref 39.0–52.0)
Hemoglobin: 14.4 g/dL (ref 13.0–17.0)
Immature Granulocytes: 1 %
Lymphocytes Relative: 32 %
Lymphs Abs: 3.4 10*3/uL (ref 0.7–4.0)
MCH: 30.8 pg (ref 26.0–34.0)
MCHC: 35.6 g/dL (ref 30.0–36.0)
MCV: 86.5 fL (ref 80.0–100.0)
Monocytes Absolute: 1.2 10*3/uL — ABNORMAL HIGH (ref 0.1–1.0)
Monocytes Relative: 11 %
Neutro Abs: 5.6 10*3/uL (ref 1.7–7.7)
Neutrophils Relative %: 52 %
Platelets: 210 10*3/uL (ref 150–400)
RBC: 4.68 MIL/uL (ref 4.22–5.81)
RDW: 12.8 % (ref 11.5–15.5)
WBC: 10.7 10*3/uL — ABNORMAL HIGH (ref 4.0–10.5)
nRBC: 0 % (ref 0.0–0.2)

## 2021-09-15 LAB — COMPREHENSIVE METABOLIC PANEL
ALT: 29 U/L (ref 0–44)
AST: 25 U/L (ref 15–41)
Albumin: 3.9 g/dL (ref 3.5–5.0)
Alkaline Phosphatase: 60 U/L (ref 38–126)
Anion gap: 7 (ref 5–15)
BUN: 19 mg/dL (ref 6–20)
CO2: 23 mmol/L (ref 22–32)
Calcium: 9 mg/dL (ref 8.9–10.3)
Chloride: 111 mmol/L (ref 98–111)
Creatinine, Ser: 0.88 mg/dL (ref 0.61–1.24)
GFR, Estimated: >60 mL/min (ref >60)
Glucose, Bld: 96 mg/dL (ref 70–99)
Potassium: 3.6 mmol/L (ref 3.5–5.1)
Sodium: 141 mmol/L (ref 135–145)
Total Bilirubin: 0.5 mg/dL (ref 0.3–1.2)
Total Protein: 6.9 g/dL (ref 6.5–8.1)

## 2021-09-15 LAB — LIPASE, BLOOD: Lipase: 38 U/L (ref 11–51)

## 2021-09-15 MED ORDER — TRIMETHOBENZAMIDE HCL 100 MG/ML IM SOLN: 200.0000 mg | Freq: Once | INTRAMUSCULAR | Status: DC

## 2021-09-15 MED ORDER — OXYCODONE HCL 5 MG PO TABS
10.0000 mg | ORAL_TABLET | Freq: Once | ORAL | Status: AC
  Administered 2021-09-15: 10 mg via ORAL
  Filled 2021-09-15: qty 2

## 2021-09-15 MED ORDER — SODIUM CHLORIDE 0.9 % IV BOLUS
1000.0000 mL | Freq: Once | INTRAVENOUS | Status: AC
  Administered 2021-09-15: 1000 mL via INTRAVENOUS

## 2021-09-15 MED ORDER — SODIUM CHLORIDE 0.9 % IV SOLN
6.2500 mg | Freq: Once | INTRAVENOUS | Status: AC
  Administered 2021-09-15: 6.25 mg via INTRAVENOUS
  Filled 2021-09-15: qty 0.25

## 2021-09-15 MED ORDER — ACETAMINOPHEN 325 MG PO TABS
650.0000 mg | ORAL_TABLET | Freq: Once | ORAL | Status: AC
  Administered 2021-09-15: 650 mg via ORAL
  Filled 2021-09-15: qty 2

## 2021-09-15 NOTE — ED Provider Notes (Addendum)
?[facility] ?Provider Note ? ? ?CSN: [id number] ?Arrival date & time: 09/14/21  2317 ? ?  ? ?History ? ?Chief Complaint  ?Patient presents with  ? Abdominal Pain  ? ? ?[name] is a 46 y.o. male. ? ?Pt is a 46 yo male with pmh of chronic pancreatitis, chronic abdominal pain, drug-seeking behavior, nephrolithiasis, and opiate dependence presenting for complaints of abdominal pain.  Patient with epigastric abdominal pain that started yesterday, radiating to the back, and associated with nausea without vomiting.  Patient denies any fevers, chills, diarrhea, or vomiting.  States " this feels like my typical pancreatitis".  ? ?The history is provided by the patient. No language interpreter was used.  ?Abdominal Pain ?Associated symptoms: nausea   ?Associated symptoms: no chest pain, no chills, no cough, no dysuria, no fever, no hematuria, no shortness of breath, no sore throat and no vomiting   ? ?  ? ?Home Medications ?Prior to Admission medications   ?Medication Sig Start Date End Date Taking? Authorizing Provider  ?amLODipine (NORVASC) 5 MG tablet Take 1 tablet (5 mg total) by mouth daily. 04/29/21   Martinique, Betty G, MD  ?benazepril (LOTENSIN) 40 MG tablet Take 1 tablet (40 mg total) by mouth daily. 04/29/21   Martinique, Betty G, MD  ?fluticasone (FLONASE) 50 MCG/ACT nasal spray PLACE 1 SPRAY INTO BOTH NOSTRILS 2 (TWO) TIMES DAILY 07/26/21   Martinique, Betty G, MD  ?KLOR-CON M20 20 MEQ tablet TAKE 1 TABLET BY MOUTH EVERY DAY 07/04/21   Martinique, Betty G, MD  ?metoprolol succinate (TOPROL-XL) 100 MG 24 hr tablet TAKE 1 TABLET BY MOUTH DAILY. TAKE WITH OR IMMEDIATELY FOLLOWING A MEAL. 05/01/21   Martinique, Betty G, MD  ?ondansetron (ZOFRAN ODT) 4 MG disintegrating tablet Place 1 tablet under the tongue every 12 hours PRN nausea 06/16/21   Hayden Rasmussen, MD  ?promethazine (PHENERGAN) 25 MG tablet Take 1 tablet (25 mg total) by mouth every 6 (six) hours as needed for nausea or vomiting. 07/12/21    Irene Shipper, MD  ?rosuvastatin (CRESTOR) 10 MG tablet Take 1 tablet (10 mg total) by mouth daily. 05/23/21   Martinique, Betty G, MD  ?   ? ?Allergies    ?Cortisone, Eggs or egg-derived products, Ketorolac, Ketorolac tromethamine, Haldol [haloperidol lactate], Haloperidol, Hydrocortisone, Iodinated contrast media, Ketorolac tromethamine, Reglan [metoclopramide], Compazine [prochlorperazine], and Droperidol   ? ?Review of Systems   ?Review of Systems  ?Constitutional:  Negative for chills and fever.  ?HENT:  Negative for ear pain and sore throat.   ?Eyes:  Negative for pain and visual disturbance.  ?Respiratory:  Negative for cough and shortness of breath.   ?Cardiovascular:  Negative for chest pain and palpitations.  ?Gastrointestinal:  Positive for abdominal pain and nausea. Negative for vomiting.  ?Genitourinary:  Negative for dysuria and hematuria.  ?Musculoskeletal:  Positive for back pain. Negative for arthralgias.  ?Skin:  Negative for color change and rash.  ?Neurological:  Negative for seizures and syncope.  ?All other systems reviewed and are negative. ? ?Physical Exam ?Updated Vital Signs ?BP (!) 157/101 (BP Location: Left Arm)   Pulse 84   Temp 98.2 ?F (36.8 ?C) (Oral)   Resp 20   Ht '6\' 1"'$  (1.854 m)   Wt 97.5 kg   SpO2 96%   BMI 28.37 kg/m?  ?Physical Exam ?Vitals and nursing note reviewed.  ?Constitutional:   ?   General: He is not in acute distress. ?   Appearance: He  is well-developed.  ?HENT:  ?   Head: Normocephalic and atraumatic.  ?Eyes:  ?   Conjunctiva/sclera: Conjunctivae normal.  ?Cardiovascular:  ?   Rate and Rhythm: Normal rate and regular rhythm.  ?   Heart sounds: No murmur heard. ?Pulmonary:  ?   Effort: Pulmonary effort is normal. No respiratory distress.  ?   Breath sounds: Normal breath sounds.  ?Abdominal:  ?   Palpations: Abdomen is soft.  ?   Tenderness: There is abdominal tenderness in the epigastric area. There is no guarding or rebound.  ?Musculoskeletal:     ?   General: No  swelling.  ?   Cervical back: Neck supple.  ?Skin: ?   General: Skin is warm and dry.  ?   Capillary Refill: Capillary refill takes less than 2 seconds.  ?Neurological:  ?   Mental Status: He is alert.  ?Psychiatric:     ?   Mood and Affect: Mood normal.  ? ? ?ED Results / Procedures / Treatments   ?Labs ?(all labs ordered are listed, but only abnormal results are displayed) ?Labs Reviewed  ?CBC WITH DIFFERENTIAL/PLATELET  ?COMPREHENSIVE METABOLIC PANEL  ?LIPASE, BLOOD  ? ? ?EKG ?None ? ?Radiology ?No results found. ? ?Procedures ?Procedures  ? ? ?Medications Ordered in ED ?Medications  ?trimethobenzamide (TIGAN) injection 200 mg (has no administration in time range)  ?acetaminophen (TYLENOL) tablet 650 mg (has no administration in time range)  ? ? ?ED Course/ Medical Decision Making/ A&P ?  ?                        ?Medical Decision Making ?Amount and/or Complexity of Data Reviewed ?Labs: ordered. ?ECG/medicine tests: ordered. ? ?Risk ?OTC drugs. ?Prescription drug management. ? ? ?1:01 AM ?46 yo male with pmh of chronic pancreatitis, chronic abdominal pain, drug-seeking behavior, nephrolithiasis, and opiate dependence presenting for complaints of abdominal pain.  Patient is alert and oriented x3, no acute distress, afebrile, stable vital signs.  Physical exam demonstrates tenderness to palpation of epigastric stomach without guarding or rigidity. ? ?I independently interpreted patient's labs and EKG.  ? ?ECG stable with no ST segment elevation or depressions.  Stable intervals.  Normal sinus rhythm with a rate of 65 bpm. ? ?Stable laboratory studies and lipase within normal limits. IV fluids, Tylenol, oxycodone given. Pt admits to nausea. Allergies to zofran and reglan. No tigan at location. Phenergan given.  At this time patient's abdomen is soft with minimal tenderness and has admitted to significant improvement of symptoms.  No signs or symptoms of sepsis.  Heart review demonstrates that patient has had 7  abdominal CT scans  in the past year.  Low suspicion for any acute intra-abdominal processes at this time.  No further imaging at this time is recommended.  ? ?Chart review demonstrates patient has had multiple emergency department valuations for chronic pancreatitis.  Patient's chart is also flagged for controlled medication caution for drug-seeking behavior. ? ?Patient in no distress and overall condition improved here in the ED. Detailed discussions were had with the patient regarding current findings, and need for close f/u with PCP or on call doctor. The patient has been instructed to return immediately if the symptoms worsen in any way for re-evaluation. Patient verbalized understanding and is in agreement with current care plan. All questions answered prior to discharge. ? ? ? ? ? ? ? ? ?Final Clinical Impression(s) / ED Diagnoses ?Final diagnoses:  ?Epigastric abdominal pain  ? ? ?  Rx / DC Orders ?ED Discharge Orders   ? ? None  ? ?  ? ? ?  ?Lianne Cure, DO ?42/68/34 0356 ? ?  ?Lianne Cure, DO ?19/62/22 0406 ? ?

## 2021-09-15 NOTE — ED Notes (Signed)
Pt hooked up to the monitor with a 5 lead, BP cuff and pulse ox ?

## 2021-09-18 ENCOUNTER — Other Ambulatory Visit: Payer: Self-pay | Admitting: Family Medicine

## 2021-10-10 ENCOUNTER — Other Ambulatory Visit: Payer: Self-pay | Admitting: Family Medicine

## 2021-10-10 DIAGNOSIS — E876 Hypokalemia: Secondary | ICD-10-CM

## 2021-10-29 ENCOUNTER — Other Ambulatory Visit: Payer: Self-pay | Admitting: Family Medicine

## 2021-10-29 DIAGNOSIS — I10 Essential (primary) hypertension: Secondary | ICD-10-CM

## 2021-10-30 ENCOUNTER — Other Ambulatory Visit: Payer: Self-pay | Admitting: Family Medicine

## 2021-10-30 DIAGNOSIS — J309 Allergic rhinitis, unspecified: Secondary | ICD-10-CM

## 2021-11-01 ENCOUNTER — Other Ambulatory Visit: Payer: Self-pay

## 2021-11-01 ENCOUNTER — Encounter (HOSPITAL_BASED_OUTPATIENT_CLINIC_OR_DEPARTMENT_OTHER): Payer: Self-pay | Admitting: Emergency Medicine

## 2021-11-01 ENCOUNTER — Telehealth: Payer: Self-pay

## 2021-11-01 ENCOUNTER — Emergency Department (HOSPITAL_BASED_OUTPATIENT_CLINIC_OR_DEPARTMENT_OTHER)
Admission: EM | Admit: 2021-11-01 | Discharge: 2021-11-01 | Disposition: A | Discharge status: Home / Self Care | Payer: No Typology Code available for payment source | Attending: Emergency Medicine | Admitting: Emergency Medicine

## 2021-11-01 DIAGNOSIS — Z79899 Other long term (current) drug therapy: Secondary | ICD-10-CM | POA: Diagnosis not present

## 2021-11-01 DIAGNOSIS — R1010 Upper abdominal pain, unspecified: Secondary | ICD-10-CM | POA: Insufficient documentation

## 2021-11-01 DIAGNOSIS — R112 Nausea with vomiting, unspecified: Secondary | ICD-10-CM | POA: Insufficient documentation

## 2021-11-01 DIAGNOSIS — R1013 Epigastric pain: Secondary | ICD-10-CM | POA: Diagnosis not present

## 2021-11-01 DIAGNOSIS — F17210 Nicotine dependence, cigarettes, uncomplicated: Secondary | ICD-10-CM | POA: Diagnosis not present

## 2021-11-01 DIAGNOSIS — G8929 Other chronic pain: Secondary | ICD-10-CM | POA: Insufficient documentation

## 2021-11-01 DIAGNOSIS — I10 Essential (primary) hypertension: Secondary | ICD-10-CM | POA: Insufficient documentation

## 2021-11-01 LAB — CBC WITH DIFFERENTIAL/PLATELET
Abs Immature Granulocytes: 0.04 10*3/uL (ref 0.00–0.07)
Basophils Absolute: 0.1 10*3/uL (ref 0.0–0.1)
Basophils Relative: 1 %
Eosinophils Absolute: 0.3 10*3/uL (ref 0.0–0.5)
Eosinophils Relative: 3 %
HCT: 43.2 % (ref 39.0–52.0)
Hemoglobin: 15.5 g/dL (ref 13.0–17.0)
Immature Granulocytes: 0 %
Lymphocytes Relative: 32 %
Lymphs Abs: 3.6 10*3/uL (ref 0.7–4.0)
MCH: 30.6 pg (ref 26.0–34.0)
MCHC: 35.9 g/dL (ref 30.0–36.0)
MCV: 85.4 fL (ref 80.0–100.0)
Monocytes Absolute: 1.2 10*3/uL — ABNORMAL HIGH (ref 0.1–1.0)
Monocytes Relative: 11 %
Neutro Abs: 6.1 10*3/uL (ref 1.7–7.7)
Neutrophils Relative %: 53 %
Platelets: 210 10*3/uL (ref 150–400)
RBC: 5.06 MIL/uL (ref 4.22–5.81)
RDW: 12.3 % (ref 11.5–15.5)
WBC: 11.4 10*3/uL — ABNORMAL HIGH (ref 4.0–10.5)
nRBC: 0 % (ref 0.0–0.2)

## 2021-11-01 LAB — BASIC METABOLIC PANEL
Anion gap: 8 (ref 5–15)
BUN: 16 mg/dL (ref 6–20)
CO2: 21 mmol/L — ABNORMAL LOW (ref 22–32)
Calcium: 9.1 mg/dL (ref 8.9–10.3)
Chloride: 110 mmol/L (ref 98–111)
Creatinine, Ser: 0.89 mg/dL (ref 0.61–1.24)
GFR, Estimated: >60 mL/min (ref >60)
Glucose, Bld: 108 mg/dL — ABNORMAL HIGH (ref 70–99)
Potassium: 3.6 mmol/L (ref 3.5–5.1)
Sodium: 139 mmol/L (ref 135–145)

## 2021-11-01 MED ORDER — PROMETHAZINE HCL 25 MG/ML IJ SOLN
INTRAMUSCULAR | Status: AC
  Filled 2021-11-01: qty 1

## 2021-11-01 MED ORDER — HYDROMORPHONE HCL 1 MG/ML IJ SOLN
1.0000 mg | INTRAMUSCULAR | Status: AC | PRN
  Administered 2021-11-01 (×2): 1 mg via INTRAVENOUS
  Filled 2021-11-01 (×2): qty 1

## 2021-11-01 MED ORDER — SODIUM CHLORIDE 0.9 % IV BOLUS
1000.0000 mL | Freq: Once | INTRAVENOUS | Status: AC
  Administered 2021-11-01: 1000 mL via INTRAVENOUS

## 2021-11-01 MED ORDER — SODIUM CHLORIDE 0.9 % IV SOLN
25.0000 mg | Freq: Once | INTRAVENOUS | Status: AC
  Administered 2021-11-01: 25 mg via INTRAVENOUS
  Filled 2021-11-01: qty 1

## 2021-11-01 NOTE — Telephone Encounter (Signed)
Transition Care Management Unsuccessful Follow-up Telephone Call  Date of discharge and from where:  11/01/21 from [facility]  Attempts:  1st Attempt  Reason for unsuccessful TCM follow-up call:  Left voice message  If pt calls back, please schedule ED f/u if needed. Pt also needs Return in about 5 months (around 01/07/2022) for HTN and HLD.

## 2021-11-01 NOTE — ED Provider Notes (Signed)
[place] DEPT [facility] Provider Note: Georgena Spurling, MD, FACEP  CSN: [id number] MRN: [id number] ARRIVAL: 11/01/21 at 0420 ROOM: [place]  Abdominal Pain   HISTORY OF PRESENT ILLNESS  11/01/21 4:36 AM [name] is a 46 y.o. male with chronic epigastric pain related to chronic pancreatitis.  He has episodes of nausea and vomiting which prevent him from taking his pain medications and has been seen in the ED for multiple times for these episodes.  His last visit for such an episode was 09/14/2021.  He is normally on chronic oxycodone.  He is here with another episode of nausea and vomiting that began 3 days ago.  He has attempted a clear liquid/Gatorade diet but continues to vomit and has not been able to hold down his oxycodone.  He rates his pain as a 7 out of 10, sharp and constant in nature.  It is worse with movement or palpation.   Past Medical History:  Diagnosis Date   Chronic abdominal pain    Diverticulitis    Drug-seeking behavior    Hypertension    Kidney stones    Pancreatitis, chronic ([place]) 05/28/2005    Past Surgical History:  Procedure Laterality Date   APPENDECTOMY     CHOLECYSTECTOMY     ERCP N/A 12/30/2019   Procedure: ENDOSCOPIC RETROGRADE CHOLANGIOPANCREATOGRAPHY (ERCP);  Surgeon: Milus Banister, MD;  Location: [facility];  Service: Endoscopy;  Laterality: N/A;   ERCP N/A 03/04/2021   Procedure: ENDOSCOPIC RETROGRADE CHOLANGIOPANCREATOGRAPHY (ERCP);  Surgeon: Carol Ada, MD;  Location: [facility];  Service: Endoscopy;  Laterality: N/A;   ERCP W/ METAL STENT PLACEMENT     KIDNEY STONE SURGERY     REMOVAL OF STONES  12/30/2019   Procedure: REMOVAL OF STONES;  Surgeon: Milus Banister, MD;  Location: [facility];  Service: Endoscopy;;   REMOVAL OF STONES  03/04/2021   Procedure: REMOVAL OF STONES;  Surgeon: Carol Ada, MD;  Location: [facility];  Service: Endoscopy;;   SPHINCTEROTOMY  03/04/2021   Procedure:  Joan Mayans;  Surgeon: Carol Ada, MD;  Location: [facility];  Service: Endoscopy;;    Family History  Problem Relation Age of Onset   Breast cancer Mother    Early death Mother    Hypertension Mother    Heart failure Father    Hypertension Father    Heart disease Father    Hyperlipidemia Father    Pancreatic cancer Paternal Grandmother        possibly   Pancreatic cancer Paternal Aunt    Colon cancer Neg Hx     Social History   Tobacco Use   Smoking status: Every Day    Packs/day: 0.50    Types: Cigarettes   Smokeless tobacco: Never  Vaping Use   Vaping Use: Never used  Substance Use Topics   Alcohol use: Not Currently   Drug use: No    Prior to Admission medications   Medication Sig Start Date End Date Taking? Authorizing Provider  amLODipine (NORVASC) 5 MG tablet TAKE 1 TABLET (5 MG TOTAL) BY MOUTH DAILY. 10/30/21   Martinique, Betty G, MD  benazepril (LOTENSIN) 40 MG tablet TAKE 1 TABLET BY MOUTH EVERY DAY 10/30/21   [place], Betty G, MD  fluticasone [facility]) 50 MCG/ACT nasal spray PLACE 1 SPRAY INTO BOTH NOSTRILS 2 (TWO) TIMES DAILY 10/30/21   [place], Betty G, MD  KLOR-CON M20 20 MEQ tablet TAKE 1 TABLET BY MOUTH EVERY DAY 10/10/21   [place], Betty G,  MD  metoprolol succinate (TOPROL-XL) 100 MG 24 hr tablet TAKE 1 TABLET BY MOUTH EVERY DAY WITH OR IMMEDIATELY FOLLOWING A MEAL 10/30/21   [place], Betty G, MD  ondansetron (ZOFRAN ODT) 4 MG disintegrating tablet Place 1 tablet under the tongue every 12 hours PRN nausea 06/16/21   Hayden Rasmussen, MD  promethazine (PHENERGAN) 25 MG tablet Take 1 tablet (25 mg total) by mouth every 6 (six) hours as needed for nausea or vomiting. 07/12/21   Irene Shipper, MD  rosuvastatin (CRESTOR) 10 MG tablet TAKE 1 TABLET BY MOUTH EVERY DAY 09/18/21   [place], Betty G, MD    Allergies Cortisone, Eggs or egg-derived products, Ketorolac, Ketorolac tromethamine, Haldol [haloperidol lactate], Haloperidol, Hydrocortisone, Iodinated contrast media,  Ketorolac tromethamine, Reglan [metoclopramide], Compazine [prochlorperazine], and Droperidol   REVIEW OF SYSTEMS  Negative except as noted here or in the History of Present Illness.   PHYSICAL EXAMINATION  Initial Vital Signs Blood pressure (!) 161/104, pulse 78, temperature 97.8 F (36.6 C), temperature source Oral, resp. rate 16, height _0  (1.854 m), weight 95.3 kg, SpO2 94 %.  Examination General: Well-developed, well-nourished male in no acute distress; appearance consistent with age of record HENT: normocephalic; atraumatic Eyes: Normal appearance Neck: supple Heart: regular rate and rhythm Lungs: clear to auscultation bilaterally Abdomen: soft; nondistended; epigastric tenderness; bowel sounds present Extremities: No deformity; full range of motion; pulses normal Neurologic: Awake, alert and oriented; motor function intact in all extremities and symmetric; no facial droop Skin: Warm and dry Psychiatric: Normal mood and affect   RESULTS  Summary of this visit's results, reviewed and interpreted by myself:   EKG Interpretation  Date/Time:    Ventricular Rate:    PR Interval:    QRS Duration:   QT Interval:    QTC Calculation:   R Axis:     Text Interpretation:         Laboratory Studies: Results for orders placed or performed during the hospital encounter of 11/01/21 (from the past 24 hour(s))  CBC with Differential     Status: Abnormal   Collection Time: 11/01/21  4:45 AM  Result Value Ref Range   WBC 11.4 (H) 4.0 - 10.5 K/uL   RBC 5.06 4.22 - 5.81 MIL/uL   Hemoglobin 15.5 13.0 - 17.0 g/dL   HCT 43.2 39.0 - 52.0 %   MCV 85.4 80.0 - 100.0 fL   MCH 30.6 26.0 - 34.0 pg   MCHC 35.9 30.0 - 36.0 g/dL   RDW 12.3 11.5 - 15.5 %   Platelets 210 150 - 400 K/uL   nRBC 0.0 0.0 - 0.2 %   Neutrophils Relative % 53 %   Neutro Abs 6.1 1.7 - 7.7 K/uL   Lymphocytes Relative 32 %   Lymphs Abs 3.6 0.7 - 4.0 K/uL   Monocytes Relative 11 %   Monocytes Absolute 1.2 (H)  0.1 - 1.0 K/uL   Eosinophils Relative 3 %   Eosinophils Absolute 0.3 0.0 - 0.5 K/uL   Basophils Relative 1 %   Basophils Absolute 0.1 0.0 - 0.1 K/uL   Immature Granulocytes 0 %   Abs Immature Granulocytes 0.04 0.00 - 0.07 K/uL  Basic metabolic panel     Status: Abnormal   Collection Time: 11/01/21  4:45 AM  Result Value Ref Range   Sodium 139 135 - 145 mmol/L   Potassium 3.6 3.5 - 5.1 mmol/L   Chloride 110 98 - 111 mmol/L   CO2 21 (L) 22 - 32  mmol/L   Glucose, Bld 108 (H) 70 - 99 mg/dL   BUN 16 6 - 20 mg/dL   Creatinine, Ser 0.89 0.61 - 1.24 mg/dL   Calcium 9.1 8.9 - 10.3 mg/dL   GFR, Estimated >60 >60 mL/min   Anion gap 8 5 - 15   Imaging Studies: No results found.  ED COURSE and MDM  Nursing notes, initial and subsequent vitals signs, including pulse oximetry, reviewed and interpreted by myself.  Vitals:   11/01/21 0430 11/01/21 0433 11/01/21 0600  BP:  (!) 161/104 (!) 138/95  Pulse:  78 65  Resp:  16 16  Temp:  97.8 F (36.6 C)   TempSrc:  Oral   SpO2:  94% 95%  Weight: 95.3 kg    Height: _0  (1.854 m)     Medications  promethazine (PHENERGAN) 25 MG/ML injection (has no administration in time range)  sodium chloride 0.9 % bolus 1,000 mL (1,000 mLs Intravenous New Bag/Given 11/01/21 0448)  promethazine (PHENERGAN) 25 mg in sodium chloride 0.9 % 50 mL IVPB (25 mg Intravenous New Bag/Given 11/01/21 0448)  HYDROmorphone (DILAUDID) injection 1 mg (1 mg Intravenous Given 11/01/21 0538)   6:33 AM Patient feeling better after IV fluids and medications.  His CBC and be met are reassuring.  I did normal liver LFTs or lipase as these are almost always within normal limits.   PROCEDURES  Procedures   ED DIAGNOSES     ICD-10-CM   1. Chronic upper abdominal pain  R10.10    G89.29     2. Nausea and vomiting in adult  R11.2          Shanon Rosser, MD 11/01/21 [phone]

## 2021-11-01 NOTE — ED Triage Notes (Signed)
Pt is c/o upper abd pain with nausea and vomiting that started Sunday night Monday morning

## 2021-11-03 NOTE — Telephone Encounter (Signed)
Transition Care Management Unsuccessful Follow-up Telephone Call  Date of discharge and from where:  11/01/21 from [facility]  Attempts:  2nd Attempt  Reason for unsuccessful TCM follow-up call:  Left voice message  If pt calls back, please schedule ED f/u if needed. Pt also needs Return in about 5 months (around 01/07/2022) for HTN and HLD.

## 2021-11-09 ENCOUNTER — Encounter (HOSPITAL_BASED_OUTPATIENT_CLINIC_OR_DEPARTMENT_OTHER): Payer: Self-pay | Admitting: Urology

## 2021-11-09 ENCOUNTER — Emergency Department (HOSPITAL_BASED_OUTPATIENT_CLINIC_OR_DEPARTMENT_OTHER)
Admission: EM | Admit: 2021-11-09 | Discharge: 2021-11-10 | Disposition: A | Discharge status: Home / Self Care | Payer: No Typology Code available for payment source | Attending: Emergency Medicine | Admitting: Emergency Medicine

## 2021-11-09 ENCOUNTER — Other Ambulatory Visit: Payer: Self-pay

## 2021-11-09 DIAGNOSIS — R1030 Lower abdominal pain, unspecified: Secondary | ICD-10-CM | POA: Insufficient documentation

## 2021-11-09 DIAGNOSIS — R1013 Epigastric pain: Secondary | ICD-10-CM

## 2021-11-09 DIAGNOSIS — R1010 Upper abdominal pain, unspecified: Secondary | ICD-10-CM | POA: Diagnosis present

## 2021-11-09 DIAGNOSIS — Z8719 Personal history of other diseases of the digestive system: Secondary | ICD-10-CM

## 2021-11-09 DIAGNOSIS — R110 Nausea: Secondary | ICD-10-CM | POA: Insufficient documentation

## 2021-11-09 LAB — CBC
HCT: 45.7 % (ref 39.0–52.0)
Hemoglobin: 16.8 g/dL (ref 13.0–17.0)
MCH: 31.1 pg (ref 26.0–34.0)
MCHC: 36.8 g/dL — ABNORMAL HIGH (ref 30.0–36.0)
MCV: 84.5 fL (ref 80.0–100.0)
Platelets: 236 10*3/uL (ref 150–400)
RBC: 5.41 MIL/uL (ref 4.22–5.81)
RDW: 12.2 % (ref 11.5–15.5)
WBC: 11.8 10*3/uL — ABNORMAL HIGH (ref 4.0–10.5)
nRBC: 0 % (ref 0.0–0.2)

## 2021-11-09 MED ORDER — ONDANSETRON HCL 4 MG/2ML IJ SOLN
4.0000 mg | Freq: Once | INTRAMUSCULAR | Status: AC | PRN
  Administered 2021-11-09: 4 mg via INTRAVENOUS
  Filled 2021-11-09: qty 2

## 2021-11-09 NOTE — ED Triage Notes (Signed)
Abdominal pain with N/V x 2 days  States h/o chronic pancreatitis  Unable to tolerate fluids at this time

## 2021-11-09 NOTE — ED Notes (Signed)
Pt unable to urinate at this time, spec cup given while waiting 

## 2021-11-10 LAB — COMPREHENSIVE METABOLIC PANEL
ALT: 23 U/L (ref 0–44)
AST: 24 U/L (ref 15–41)
Albumin: 4.5 g/dL (ref 3.5–5.0)
Alkaline Phosphatase: 80 U/L (ref 38–126)
Anion gap: 7 (ref 5–15)
BUN: 14 mg/dL (ref 6–20)
CO2: 25 mmol/L (ref 22–32)
Calcium: 9.6 mg/dL (ref 8.9–10.3)
Chloride: 109 mmol/L (ref 98–111)
Creatinine, Ser: 0.88 mg/dL (ref 0.61–1.24)
GFR, Estimated: >60 mL/min (ref >60)
Glucose, Bld: 133 mg/dL — ABNORMAL HIGH (ref 70–99)
Potassium: 3.5 mmol/L (ref 3.5–5.1)
Sodium: 141 mmol/L (ref 135–145)
Total Bilirubin: 0.7 mg/dL (ref 0.3–1.2)
Total Protein: 7.7 g/dL (ref 6.5–8.1)

## 2021-11-10 LAB — LIPASE, BLOOD: Lipase: 43 U/L (ref 11–51)

## 2021-11-10 MED ORDER — ONDANSETRON HCL 4 MG/2ML IJ SOLN
4.0000 mg | Freq: Once | INTRAMUSCULAR | Status: AC
  Administered 2021-11-10: 4 mg via INTRAVENOUS
  Filled 2021-11-10: qty 2

## 2021-11-10 MED ORDER — HYDROMORPHONE HCL 1 MG/ML IJ SOLN
1.0000 mg | Freq: Once | INTRAMUSCULAR | Status: AC
  Administered 2021-11-10: 1 mg via INTRAVENOUS
  Filled 2021-11-10: qty 1

## 2021-11-10 MED ORDER — SODIUM CHLORIDE 0.9 % IV BOLUS
1000.0000 mL | Freq: Once | INTRAVENOUS | Status: AC
  Administered 2021-11-10: 1000 mL via INTRAVENOUS

## 2021-11-10 NOTE — ED Provider Notes (Signed)
[place] [facility] Provider Note   CSN: [id number] Arrival date & time: 11/09/21  2324     History  Chief Complaint  Patient presents with   Abdominal Pain    [name] is a 46 y.o. male.  Patient is a 46 year old male with history of chronic pancreatitis.  Patient well-known to the emergency department for frequent visits involving flareups of this.  Presents today with complaints of upper abdominal pain and "not keeping anything down for the past 3 days".  He denies diarrhea.  He denies any bloody stool or vomit.  He denies fevers or chills.  There are no aggravating or alleviating factors.  The history is provided by the patient.       Home Medications Prior to Admission medications   Medication Sig Start Date End Date Taking? Authorizing Provider  amLODipine (NORVASC) 5 MG tablet TAKE 1 TABLET (5 MG TOTAL) BY MOUTH DAILY. 10/30/21   Martinique, Betty G, MD  benazepril (LOTENSIN) 40 MG tablet TAKE 1 TABLET BY MOUTH EVERY DAY 10/30/21   [place], Betty G, MD  fluticasone [facility]) 50 MCG/ACT nasal spray PLACE 1 SPRAY INTO BOTH NOSTRILS 2 (TWO) TIMES DAILY 10/30/21   [place], Betty G, MD  KLOR-CON M20 20 MEQ tablet TAKE 1 TABLET BY MOUTH EVERY DAY 10/10/21   [place], Betty G, MD  metoprolol succinate (TOPROL-XL) 100 MG 24 hr tablet TAKE 1 TABLET BY MOUTH EVERY DAY WITH OR IMMEDIATELY FOLLOWING A MEAL 10/30/21   [place], Betty G, MD  ondansetron (ZOFRAN ODT) 4 MG disintegrating tablet Place 1 tablet under the tongue every 12 hours PRN nausea 06/16/21   Hayden Rasmussen, MD  promethazine (PHENERGAN) 25 MG tablet Take 1 tablet (25 mg total) by mouth every 6 (six) hours as needed for nausea or vomiting. 07/12/21   Irene Shipper, MD  rosuvastatin (CRESTOR) 10 MG tablet TAKE 1 TABLET BY MOUTH EVERY DAY 09/18/21   [place], Betty G, MD      Allergies    Cortisone, Eggs or egg-derived products, Ketorolac, Ketorolac tromethamine, Haldol [haloperidol lactate], Haloperidol,  Hydrocortisone, Iodinated contrast media, Ketorolac tromethamine, Reglan [metoclopramide], Compazine [prochlorperazine], and Droperidol    Review of Systems   Review of Systems  All other systems reviewed and are negative.   Physical Exam Updated Vital Signs BP (!) 155/104 (BP Location: Right Arm)   Pulse 90   Temp 98.6 F (37 C) (Oral)   Resp 20   Ht '6\' 1"'$  (1.854 m)   Wt 92.3 kg   SpO2 95%   BMI 26.83 kg/m  Physical Exam Vitals and nursing note reviewed.  Constitutional:      General: He is not in acute distress.    Appearance: He is well-developed. He is not diaphoretic.  HENT:     Head: Normocephalic and atraumatic.  Cardiovascular:     Rate and Rhythm: Normal rate and regular rhythm.     Heart sounds: No murmur heard.    No friction rub.  Pulmonary:     Effort: Pulmonary effort is normal. No respiratory distress.     Breath sounds: Normal breath sounds. No wheezing or rales.  Abdominal:     General: Bowel sounds are normal. There is no distension.     Palpations: Abdomen is soft.     Tenderness: There is abdominal tenderness in the suprapubic area. There is no right CVA tenderness, left CVA tenderness, guarding or rebound.  Musculoskeletal:        General: Normal  range of motion.     Cervical back: Normal range of motion and neck supple.  Skin:    General: Skin is warm and dry.  Neurological:     Mental Status: He is alert and oriented to person, place, and time.     Coordination: Coordination normal.     ED Results / Procedures / Treatments   Labs (all labs ordered are listed, but only abnormal results are displayed) Labs Reviewed  COMPREHENSIVE METABOLIC PANEL - Abnormal; Notable for the following components:      Result Value   Glucose, Bld 133 (*)    All other components within normal limits  CBC - Abnormal; Notable for the following components:   WBC 11.8 (*)    MCHC 36.8 (*)    All other components within normal limits  LIPASE, BLOOD  URINALYSIS,  ROUTINE W REFLEX MICROSCOPIC    EKG None  Radiology No results found.  Procedures Procedures    Medications Ordered in ED Medications  sodium chloride 0.9 % bolus 1,000 mL (has no administration in time range)  ondansetron (ZOFRAN) injection 4 mg (has no administration in time range)  HYDROmorphone (DILAUDID) injection 1 mg (has no administration in time range)  ondansetron (ZOFRAN) injection 4 mg (4 mg Intravenous Given 11/09/21 2340)    ED Course/ Medical Decision Making/ A&P  Patient presenting here with complaints of abdominal discomfort.  He has history of chronic pancreatitis and frequents the ER with flareups of this.  Patient's laboratory studies are normal and his lipase is once again, normal.  He was given IV fluids and medicine for nausea and pain.  Patient to be discharged with follow-up with his GI doctor.  Final Clinical Impression(s) / ED Diagnoses Final diagnoses:  None    Rx / DC Orders ED Discharge Orders     None         Veryl Speak, MD 11/10/21 [phone]

## 2021-11-10 NOTE — Discharge Instructions (Signed)
Follow-up with your primary doctor/gastroenterologist for further recommendations on managing your chronic pain.

## 2021-11-13 NOTE — Progress Notes (Unsigned)
Chief Complaint  Patient presents with   Follow-up    From hospital visit    HPI: [name] is a 46 y.o. male with hx of HTN,HLD,and chronic pain here today to follow on recent ED visit. Evaluated for abdominal pain on 11/09/21 and 11/01/21. No new associated symptoms. He has not identified exacerbating or alleviating factors. I am not longer managing his pain, last Rx for Percocet given in 06/2021 #30 tabs. Referred to pain management, states that he has not heard from pain management office. Requesting Rx for Oxycodone to have while waiting to be seen by pain management provider.  Upper abdominal pain, constant,6/10, not radiated. + Nausea and vomiting . Negative for fever,chills, changes in bowel habits, blood in stool,or urinary symptoms. He takes Phenergan and Zofran. Follows with GI, he does not have a f/u appt, last seen 07/12/21.  HTN:His BP during ED visit elevated at 155/104. He is on Metoprolol succinate 100 mg daily, Amlodipine 5 mg daily,and Benazepril 40 mg daily. Home BP's 120's/70's. Negative for severe/frequent headache, visual changes, chest pain, dyspnea, palpitation, focal weakness, or edema.  Lab Results  Component Value Date   CREATININE 0.88 11/09/2021   BUN 14 11/09/2021   NA 141 11/09/2021   K 3.5 11/09/2021   CL 109 11/09/2021   CO2 25 11/09/2021   Lab Results  Component Value Date   ALT 23 11/09/2021   AST 24 11/09/2021   ALKPHOS 80 11/09/2021   BILITOT 0.7 11/09/2021   WBC's have been mildly elevated for several months and stable. Negative for abnormal wt loss or night sweats.  Lab Results  Component Value Date   WBC 11.8 (H) 11/09/2021   HGB 16.8 11/09/2021   HCT 45.7 11/09/2021   MCV 84.5 11/09/2021   PLT 236 11/09/2021   Last time he had abdominal/pelvic imaging done 07/25/21 (CT renal stone) and 06/07/21 abdomen/pelvis CT.  Review of Systems  Constitutional:  Positive for fatigue. Negative for activity change, appetite  change and fever.  HENT:  Negative for nosebleeds, sore throat and trouble swallowing.   Respiratory:  Negative for cough and wheezing.   Genitourinary:  Negative for decreased urine volume, dysuria and hematuria.  Neurological:  Negative for syncope, facial asymmetry and weakness.  Rest see pertinent positives and negatives per HPI.  Current Outpatient Medications on File Prior to Visit  Medication Sig Dispense Refill   amLODipine (NORVASC) 5 MG tablet TAKE 1 TABLET (5 MG TOTAL) BY MOUTH DAILY. 90 tablet 1   benazepril (LOTENSIN) 40 MG tablet TAKE 1 TABLET BY MOUTH EVERY DAY 90 tablet 1   fluticasone (FLONASE) 50 MCG/ACT nasal spray PLACE 1 SPRAY INTO BOTH NOSTRILS 2 (TWO) TIMES DAILY 48 mL 1   KLOR-CON M20 20 MEQ tablet TAKE 1 TABLET BY MOUTH EVERY DAY 90 tablet 0   metoprolol succinate (TOPROL-XL) 100 MG 24 hr tablet TAKE 1 TABLET BY MOUTH EVERY DAY WITH OR IMMEDIATELY FOLLOWING A MEAL 90 tablet 1   ondansetron (ZOFRAN ODT) 4 MG disintegrating tablet Place 1 tablet under the tongue every 12 hours PRN nausea 20 tablet 0   promethazine (PHENERGAN) 25 MG tablet Take 1 tablet (25 mg total) by mouth every 6 (six) hours as needed for nausea or vomiting. 30 tablet 1   rosuvastatin (CRESTOR) 10 MG tablet TAKE 1 TABLET BY MOUTH EVERY DAY 90 tablet 1   No current facility-administered medications on file prior to visit.    Past Medical History:  Diagnosis Date   Chronic  abdominal pain    Diverticulitis    Drug-seeking behavior    Hypertension    Kidney stones    Pancreatitis, chronic ([place]) 05/28/2005   Allergies  Allergen Reactions   Cortisone Other (See Comments)    drops potassium level "bottoms out" potassium level drops potassium level Bottoms out potassium  "bottoms out" potassium level Other reaction(s): Other (See Comments) Bottoms out potassium   Eggs Or Egg-Derived Products Hives and Other (See Comments)    Rash  Rash   Ketorolac Hives and Other (See Comments)     Hives, slightly labored breathing  Hives, slightly labored breathing   Ketorolac Tromethamine Hives and Shortness Of Breath   Haldol [Haloperidol Lactate] Other (See Comments)    "jittery"    Haloperidol Other (See Comments)    "jittery" "jittery"    Hydrocortisone Hives    Also drops potassium level   Iodinated Contrast Media Nausea And Vomiting and Other (See Comments)    Hives Hives Hives Hives   Ketorolac Tromethamine Hives   Reglan [Metoclopramide]     Face "draws" and gets figgety   Compazine [Prochlorperazine] Other (See Comments)    Dystonic reaction   Droperidol Other (See Comments)    Makes face draw up    Social History   Socioeconomic History   Marital status: Married    Spouse name: Not on file   Number of children: 1   Years of education: Not on file   Highest education level: Some college, no degree  Occupational History   Not on file  Tobacco Use   Smoking status: Every Day    Packs/day: 0.50    Types: Cigarettes   Smokeless tobacco: Never  Vaping Use   Vaping Use: Never used  Substance and Sexual Activity   Alcohol use: Not Currently   Drug use: No   Sexual activity: Not on file  Other Topics Concern   Not on file  Social History Narrative   Not on file   Social Determinants of Health   Financial Resource Strain: Low Risk  (04/03/2021)   Overall Financial Resource Strain (CARDIA)    Difficulty of Paying Living Expenses: Not hard at all  Food Insecurity: No Food Insecurity (04/03/2021)   Hunger Vital Sign    Worried About Running Out of Food in the Last Year: Never true    Ran Out of Food in the Last Year: Never true  Transportation Needs: No Transportation Needs (04/03/2021)   PRAPARE - Hydrologist (Medical): No    Lack of Transportation (Non-Medical): No  Physical Activity: Insufficiently Active (04/03/2021)   Exercise Vital Sign    Days of Exercise per Week: 1 day    Minutes of Exercise per Session: 20  min  Stress: No Stress Concern Present (04/03/2021)   [place]    Feeling of Stress : Only a little  Social Connections: Socially Integrated (04/03/2021)   Social Connection and Isolation Panel [NHANES]    Frequency of Communication with Friends and Family: Three times a week    Frequency of Social Gatherings with Friends and Family: Once a week    Attends Religious Services: More than 4 times per year    Active Member of [employer] or Organizations: Yes    Attends Archivist Meetings: More than 4 times per year    Marital Status: Married   Vitals:   11/14/21 0725  BP: 128/70  Pulse: 74  Resp: 16  Temp: 99.1 F (37.3 C)  SpO2: 93%   Body mass index is 28.68 kg/m.  Physical Exam Vitals and nursing note reviewed.  Constitutional:      General: He is not in acute distress.    Appearance: He is well-developed.  HENT:     Head: Normocephalic and atraumatic.     Mouth/Throat:     Mouth: Mucous membranes are dry.     Dentition: Has dentures.  Eyes:     Conjunctiva/sclera: Conjunctivae normal.  Cardiovascular:     Rate and Rhythm: Normal rate and regular rhythm.     Pulses:          Dorsalis pedis pulses are 2+ on the right side and 2+ on the left side.     Heart sounds: No murmur heard. Pulmonary:     Effort: Pulmonary effort is normal. No respiratory distress.     Breath sounds: Normal breath sounds.  Abdominal:     Palpations: Abdomen is soft. There is no mass.     Tenderness: There is generalized abdominal tenderness. There is no guarding or rebound.  Lymphadenopathy:     Cervical: No cervical adenopathy.  Skin:    General: Skin is warm.     Findings: No erythema or rash.  Neurological:     Mental Status: He is alert and oriented to person, place, and time.     Cranial Nerves: No cranial nerve deficit.     Gait: Gait normal.  Psychiatric:        Mood and Affect: Affect normal.     Comments:  Well groomed, good eye contact.   ASSESSMENT AND PLAN:  [name] was seen today for follow-up.  Diagnoses and all orders for this visit:  Hypertension, essential, benign BP today adequately controlled and reporting similar readings at home when pain is not as severe. Continue Metoprolol succinate 100 mg daily, Amlodipine 5 mg daily,and Benazepril 40 mg daily. Continue monitoring BP regularly and following low salt/DASH diet.  Chronic abdominal pain No new associated symptoms. Work-up has been otherwise negative. He does not have an appt with GI scheduled yet, he is supposed to follow around 03/2022.  Chronic pain disorder States that he has not received a call about appt information. My understanding was that he was already established with pain management. We discussed current guidelines, he understands I can not prescribe opioid medications at this time. We will try to contact office and inquire about appt, referral was placed in 07/2021 and according to pt, his insurance already approved visits.  Return if symptoms worsen or fail to improve, for Keep next f/u appt.  Elver Stadler G. Martinique, MD  [facility]. [place] office.

## 2021-11-14 ENCOUNTER — Encounter: Payer: Self-pay | Admitting: Family Medicine

## 2021-11-14 ENCOUNTER — Ambulatory Visit (INDEPENDENT_AMBULATORY_CARE_PROVIDER_SITE_OTHER): Payer: No Typology Code available for payment source | Admitting: Family Medicine

## 2021-11-14 VITALS — BP 128/70 | HR 74 | Temp 99.1°F | Resp 16 | Ht 73.0 in | Wt 217.4 lb

## 2021-11-14 DIAGNOSIS — G8929 Other chronic pain: Secondary | ICD-10-CM

## 2021-11-14 DIAGNOSIS — R109 Unspecified abdominal pain: Secondary | ICD-10-CM | POA: Diagnosis not present

## 2021-11-14 DIAGNOSIS — I10 Essential (primary) hypertension: Secondary | ICD-10-CM

## 2021-11-14 DIAGNOSIS — G894 Chronic pain syndrome: Secondary | ICD-10-CM | POA: Diagnosis not present

## 2021-11-14 NOTE — Assessment & Plan Note (Signed)
BP today adequately controlled and reporting similar readings at home when pain is not as severe. Continue Metoprolol succinate 100 mg daily, Amlodipine 5 mg daily,and Benazepril 40 mg daily. Continue monitoring BP regularly and following low salt/DASH diet.

## 2021-11-14 NOTE — Assessment & Plan Note (Addendum)
No new associated symptoms. Work-up has been otherwise negative. He does not have an appt with GI scheduled yet, he is supposed to follow around 03/2022.

## 2021-11-14 NOTE — Patient Instructions (Addendum)
A few things to remember from today's visit:  Hypertension, essential, benign  Chronic abdominal pain  If you need refills please call your pharmacy. Do not use My Chart to request refills or for acute issues that need immediate attention.   We will try to contact pain management office to find out about appt. Continue monitoring blood pressure. No changes today.  Please be sure medication list is accurate. If a new problem present, please set up appointment sooner than planned today.

## 2021-11-14 NOTE — Assessment & Plan Note (Signed)
States that he has not received a call about appt information. My understanding was that he was already established with pain management. We discussed current guidelines, he understands I can not prescribe opioid medications at this time. We will try to contact office and inquire about appt, referral was placed in 07/2021 and according to pt, his insurance already approved visits.

## 2021-12-03 ENCOUNTER — Emergency Department (HOSPITAL_BASED_OUTPATIENT_CLINIC_OR_DEPARTMENT_OTHER)
Admission: EM | Admit: 2021-12-03 | Discharge: 2021-12-03 | Disposition: A | Discharge status: Home / Self Care | Payer: No Typology Code available for payment source | Attending: Emergency Medicine | Admitting: Emergency Medicine

## 2021-12-03 ENCOUNTER — Other Ambulatory Visit: Payer: Self-pay

## 2021-12-03 ENCOUNTER — Telehealth: Payer: Self-pay | Admitting: Gastroenterology

## 2021-12-03 ENCOUNTER — Encounter (HOSPITAL_BASED_OUTPATIENT_CLINIC_OR_DEPARTMENT_OTHER): Payer: Self-pay

## 2021-12-03 ENCOUNTER — Emergency Department (HOSPITAL_BASED_OUTPATIENT_CLINIC_OR_DEPARTMENT_OTHER): Payer: No Typology Code available for payment source

## 2021-12-03 DIAGNOSIS — D72829 Elevated white blood cell count, unspecified: Secondary | ICD-10-CM | POA: Insufficient documentation

## 2021-12-03 DIAGNOSIS — R748 Abnormal levels of other serum enzymes: Secondary | ICD-10-CM | POA: Diagnosis not present

## 2021-12-03 DIAGNOSIS — R1013 Epigastric pain: Secondary | ICD-10-CM | POA: Insufficient documentation

## 2021-12-03 LAB — COMPREHENSIVE METABOLIC PANEL
ALT: 21 U/L (ref 0–44)
AST: 18 U/L (ref 15–41)
Albumin: 4.6 g/dL (ref 3.5–5.0)
Alkaline Phosphatase: 71 U/L (ref 38–126)
Anion gap: 8 (ref 5–15)
BUN: 11 mg/dL (ref 6–20)
CO2: 26 mmol/L (ref 22–32)
Calcium: 9.9 mg/dL (ref 8.9–10.3)
Chloride: 103 mmol/L (ref 98–111)
Creatinine, Ser: 0.87 mg/dL (ref 0.61–1.24)
GFR, Estimated: >60 mL/min (ref >60)
Glucose, Bld: 94 mg/dL (ref 70–99)
Potassium: 3.7 mmol/L (ref 3.5–5.1)
Sodium: 137 mmol/L (ref 135–145)
Total Bilirubin: 0.5 mg/dL (ref 0.3–1.2)
Total Protein: 7.4 g/dL (ref 6.5–8.1)

## 2021-12-03 LAB — CBC
HCT: 45.2 % (ref 39.0–52.0)
Hemoglobin: 15.9 g/dL (ref 13.0–17.0)
MCH: 29.9 pg (ref 26.0–34.0)
MCHC: 35.2 g/dL (ref 30.0–36.0)
MCV: 85 fL (ref 80.0–100.0)
Platelets: 265 10*3/uL (ref 150–400)
RBC: 5.32 MIL/uL (ref 4.22–5.81)
RDW: 12.2 % (ref 11.5–15.5)
WBC: 17.2 10*3/uL — ABNORMAL HIGH (ref 4.0–10.5)
nRBC: 0 % (ref 0.0–0.2)

## 2021-12-03 LAB — LIPASE, BLOOD: Lipase: 104 U/L — ABNORMAL HIGH (ref 11–51)

## 2021-12-03 MED ORDER — ONDANSETRON HCL 4 MG/2ML IJ SOLN
4.0000 mg | INTRAMUSCULAR | Status: AC | PRN
  Administered 2021-12-03: 4 mg via INTRAVENOUS
  Filled 2021-12-03: qty 2

## 2021-12-03 MED ORDER — ACETAMINOPHEN 325 MG PO TABS: 650.0000 mg | ORAL_TABLET | Freq: Four times a day (QID) | ORAL | 0 refills | Status: DC | PRN

## 2021-12-03 MED ORDER — MORPHINE SULFATE (PF) 4 MG/ML IV SOLN
6.0000 mg | Freq: Once | INTRAVENOUS | Status: AC
  Administered 2021-12-03: 6 mg via INTRAVENOUS
  Filled 2021-12-03: qty 2

## 2021-12-03 MED ORDER — SODIUM CHLORIDE 0.9 % IV BOLUS
1000.0000 mL | Freq: Once | INTRAVENOUS | Status: AC
  Administered 2021-12-03: 1000 mL via INTRAVENOUS

## 2021-12-03 MED ORDER — ONDANSETRON 4 MG PO TBDP: 4.0000 mg | ORAL_TABLET | Freq: Three times a day (TID) | ORAL | 0 refills | Status: DC | PRN

## 2021-12-03 MED ORDER — HYDROMORPHONE HCL 1 MG/ML IJ SOLN
1.0000 mg | Freq: Once | INTRAMUSCULAR | Status: AC
  Administered 2021-12-03: 1 mg via INTRAVENOUS
  Filled 2021-12-03: qty 1

## 2021-12-03 MED ORDER — OXYCODONE-ACETAMINOPHEN 5-325 MG PO TABS: 1.0000 | ORAL_TABLET | Freq: Four times a day (QID) | ORAL | 0 refills | Status: DC | PRN

## 2021-12-03 MED ORDER — IOHEXOL 300 MG/ML  SOLN
100.0000 mL | Freq: Once | INTRAMUSCULAR | Status: AC | PRN
  Administered 2021-12-03: 100 mL via INTRAVENOUS

## 2021-12-03 MED ORDER — ONDANSETRON HCL 4 MG/2ML IJ SOLN
4.0000 mg | Freq: Once | INTRAMUSCULAR | Status: AC
  Administered 2021-12-03: 4 mg via INTRAVENOUS
  Filled 2021-12-03: qty 2

## 2021-12-03 NOTE — ED Triage Notes (Signed)
Patient here POV from Home.  Endorses ABD Pain and N/V for approximately 2-3 Days. Worsening since. Pain is Mid/Left ABD and radiates through to Back.   No Diarrhea. History of Pancreatitis. No Known Fevers.   NAD Noted during Triage. A&Ox4. GCS 15. Ambulatory.

## 2021-12-03 NOTE — Discharge Instructions (Signed)
I discussed the abnormal CT scan down today with one of the [facility] gastroenterologist.  They will be in touch with Dr. Henrene Pastor.  Please call the [facility] office tomorrow morning to touch base with them about close follow-up.  Maintain a full liquid diet through tomorrow.  I am sending you home with some pain medicine.  Take as prescribed as needed.  Make sure you are drinking lots of water and use Zofran as needed for nausea

## 2021-12-03 NOTE — Telephone Encounter (Signed)
JP,    Call from [facility] this evening on this patient of yours with chronic upper abdominal pain, last seen in office Feb 2023    Similar pain flare, WBC 17k without fever or reported SIRS. Prior ERCP CT with progressive bil dil, maybe stone or debris in duct. LFTs nml, lipase 104  ED sending home with pain medicine and 24 hrs liquid diet, requesting close outpatient follow-up.  No Abx recommended.  - HD

## 2021-12-03 NOTE — ED Provider Notes (Signed)
[facility] EMERGENCY DEPT Provider Note   CSN: [id number] Arrival date & time: 12/03/21  1700     History  Chief Complaint  Patient presents with   Abdominal Pain    [name] is a 46 y.o. male.   Abdominal Pain Patient is a 46 year old male with a past medical history significant for pancreatitis presented emergency room today with epigastric pain that radiates to his back he states it is constant achy sharp stabbing states that it has been ongoing for the past 3 days.  Endorses nausea with 3 episodes of nonbloody nonbilious emesis today.  Denies any diarrhea.     Home Medications Prior to Admission medications   Medication Sig Start Date End Date Taking? Authorizing Provider  acetaminophen (TYLENOL) 325 MG tablet Take 2 tablets (650 mg total) by mouth every 6 (six) hours as needed. 12/03/21  Yes Michi Herrmann S, PA  ondansetron (ZOFRAN-ODT) 4 MG disintegrating tablet Take 1 tablet (4 mg total) by mouth every 8 (eight) hours as needed for nausea or vomiting. 12/03/21  Yes Genavive Kubicki, Ova Freshwater S, PA  oxyCODONE-acetaminophen (PERCOCET/ROXICET) 5-325 MG tablet Take 1 tablet by mouth every 6 (six) hours as needed for severe pain. 12/03/21  Yes Jamin Humphries S, PA  amLODipine (NORVASC) 5 MG tablet TAKE 1 TABLET (5 MG TOTAL) BY MOUTH DAILY. 10/30/21   [place], Betty G, MD  benazepril (LOTENSIN) 40 MG tablet TAKE 1 TABLET BY MOUTH EVERY DAY 10/30/21   [place], Betty G, MD  fluticasone [facility]) 50 MCG/ACT nasal spray PLACE 1 SPRAY INTO BOTH NOSTRILS 2 (TWO) TIMES DAILY 10/30/21   [place], Betty G, MD  KLOR-CON M20 20 MEQ tablet TAKE 1 TABLET BY MOUTH EVERY DAY 10/10/21   [place], Betty G, MD  metoprolol succinate (TOPROL-XL) 100 MG 24 hr tablet TAKE 1 TABLET BY MOUTH EVERY DAY WITH OR IMMEDIATELY FOLLOWING A MEAL 10/30/21   [place], Betty G, MD  promethazine (PHENERGAN) 25 MG tablet Take 1 tablet (25 mg total) by mouth every 6 (six) hours as needed for nausea or vomiting. 07/12/21   Irene Shipper, MD  rosuvastatin (CRESTOR) 10 MG tablet TAKE 1 TABLET BY MOUTH EVERY DAY 09/18/21   [place], Betty G, MD      Allergies    Cortisone, Eggs or egg-derived products, Ketorolac, Ketorolac tromethamine, Haldol [haloperidol lactate], Haloperidol, Hydrocortisone, Iodinated contrast media, Ketorolac tromethamine, Reglan [metoclopramide], Compazine [prochlorperazine], and Droperidol    Review of Systems   Review of Systems  Gastrointestinal:  Positive for abdominal pain.    Physical Exam Updated Vital Signs BP (!) 149/108 (BP Location: Right Arm)   Pulse 66   Temp 98.3 F (36.8 C)   Resp 18   Ht '6\' 1"'$  (1.854 m)   Wt 98.6 kg   SpO2 96%   BMI 28.68 kg/m  Physical Exam Vitals and nursing note reviewed.  Constitutional:      General: He is in acute distress.     Comments: Uncomfortable appearing 46 year old male.  Pleasant, able answer questions appropriately follow command  HENT:     Head: Normocephalic and atraumatic.     Nose: Nose normal.  Eyes:     General: No scleral icterus. Cardiovascular:     Rate and Rhythm: Normal rate and regular rhythm.     Pulses: Normal pulses.     Heart sounds: Normal heart sounds.  Pulmonary:     Effort: Pulmonary effort is normal. No respiratory distress.     Breath sounds: No wheezing.  Abdominal:     Palpations: Abdomen is soft.     Tenderness: There is abdominal tenderness.     Comments: Patient has epigastric and left upper quadrant tenderness.  No right upper quadrant tenderness.  There is some voluntary guarding with epigastric palpation.  No rebound.  No lower abdominal tenderness  Musculoskeletal:     Cervical back: Normal range of motion.     Right lower leg: No edema.     Left lower leg: No edema.  Skin:    General: Skin is warm and dry.     Capillary Refill: Capillary refill takes less than 2 seconds.  Neurological:     Mental Status: He is alert. Mental status is at baseline.  Psychiatric:        Mood and Affect: Mood  normal.        Behavior: Behavior normal.     ED Results / Procedures / Treatments   Labs (all labs ordered are listed, but only abnormal results are displayed) Labs Reviewed  LIPASE, BLOOD - Abnormal; Notable for the following components:      Result Value   Lipase 104 (*)    All other components within normal limits  CBC - Abnormal; Notable for the following components:   WBC 17.2 (*)    All other components within normal limits  COMPREHENSIVE METABOLIC PANEL  URINALYSIS, ROUTINE W REFLEX MICROSCOPIC    EKG None  Radiology CT ABDOMEN PELVIS W CONTRAST  Result Date: 12/03/2021 CLINICAL DATA:  Upper abdominal and epigastric pain EXAM: CT ABDOMEN AND PELVIS WITH CONTRAST TECHNIQUE: Multidetector CT imaging of the abdomen and pelvis was performed using the standard protocol following bolus administration of intravenous contrast. RADIATION DOSE REDUCTION: This exam was performed according to the departmental dose-optimization program which includes automated exposure control, adjustment of the mA and/or kV according to patient size and/or use of iterative reconstruction technique. CONTRAST:  153m OMNIPAQUE IOHEXOL 300 MG/ML  SOLN COMPARISON:  CT 07/25/2021, 06/07/2021, MRCP 12/28/2019 FINDINGS: Lower chest: Lung bases demonstrate no acute airspace disease. Hepatobiliary: Pneumobilia. Interval increased intra and extrahepatic biliary dilatation. Common bile duct dilated up to 15 mm. Possible noncalcified stone or debris within the distal common bile duct, series 2, image 35. Proximal pancreatic duct also appears slightly enlarged compared to prior. Pancreas: No inflammatory change Spleen: Normal in size without focal abnormality. Adrenals/Urinary Tract: Adrenal glands are unremarkable. Kidneys are normal, without renal calculi, focal lesion, or hydronephrosis. Bladder is unremarkable. Stomach/Bowel: Stomach is within normal limits. Status post appendectomy. No evidence of bowel wall thickening,  distention, or inflammatory changes. Vascular/Lymphatic: No significant vascular findings are present. No enlarged abdominal or pelvic lymph nodes. Mild atherosclerosis. Reproductive: Prostate is unremarkable. Other: Negative for pelvic effusion or free air. Musculoskeletal: No acute osseous abnormality IMPRESSION: 1. Pneumobilia felt related to prior history of sphincterotomy. Interval increase in intra and extrahepatic biliary dilatation with common bile duct dilatation up to 15 mm, raising concern for acute obstruction. Possible debris or noncalcified stone in the distal common bile duct. There is mild dilatation of the proximal pancreatic duct as well. Consider correlation with MR or ERCP. Electronically Signed   By: KDonavan FoilM.D.   On: 12/03/2021 20:38    Procedures Procedures    Medications Ordered in ED Medications  morphine (PF) 4 MG/ML injection 6 mg (6 mg Intravenous Given 12/03/21 1934)  ondansetron (ZOFRAN) injection 4 mg (4 mg Intravenous Given 12/03/21 1932)  ondansetron (ZOFRAN) injection 4 mg (4 mg Intravenous Given 12/03/21  2143)  iohexol (OMNIPAQUE) 300 MG/ML solution 100 mL (100 mLs Intravenous Contrast Given 12/03/21 2011)  HYDROmorphone (DILAUDID) injection 1 mg (1 mg Intravenous Given 12/03/21 2135)  sodium chloride 0.9 % bolus 1,000 mL (1,000 mLs Intravenous New Bag/Given 12/03/21 2136)    ED Course/ Medical Decision Making/ A&P Clinical Course as of 12/03/21 2204  Sun Dec 03, 2021  1926 4/24 ECG w nml QT.  [WF]  2130 Liquid diet, pain medicine, Henrene Pastor follow up (MRCP FU).  [WF]    Clinical Course User Index [WF] Tedd Sias, [place]                           Medical Decision Making Amount and/or Complexity of Data Reviewed Labs: ordered.   This patient presents to the ED for concern of abdominal pain nausea and vomiting, this involves a number of treatment options, and is a complaint that carries with it a moderate to high risk of complications and morbidity.  The  differential diagnosis includes pancreatitis.   The causes of generalized abdominal pain include but are not limited to AAA, mesenteric ischemia, appendicitis, diverticulitis, DKA, gastritis, gastroenteritis, AMI, nephrolithiasis, pancreatitis, peritonitis, adrenal insufficiency,lead poisoning, iron toxicity, intestinal ischemia, constipation, UTI,SBO/LBO, splenic rupture, biliary disease, IBD, IBS, PUD, or hepatitis. Testicular torsion less likely   Co morbidities: Discussed in HPI   Brief History:  Patient is a 46 year old male with a past medical history significant for pancreatitis presented emergency room today with epigastric pain that radiates to his back he states it is constant achy sharp stabbing states that it has been ongoing for the past 3 days.  Endorses nausea with 3 episodes of nonbloody nonbilious emesis today.  Denies any diarrhea.   Physical exam with significant epigastric and left upper quadrant tenderness.   EMR reviewed including pt PMHx, past surgical history and past visits to ER.   See HPI for more details   Lab Tests:   I ordered and independently interpreted labs. Labs notable for significant leukocytosis of 17.2   Lipase elevated at 104, CMP unremarkable no transaminitis.   Imaging Studies:  Abnormal findings. I personally reviewed all imaging studies. Imaging notable for IMPRESSION:  1. Pneumobilia felt related to prior history of sphincterotomy.  Interval increase in intra and extrahepatic biliary dilatation with  common bile duct dilatation up to 15 mm, raising concern for acute  obstruction. Possible debris or noncalcified stone in the distal  common bile duct. There is mild dilatation of the proximal  pancreatic duct as well. Consider correlation with MR or ERCP.      Electronically Signed    By: Donavan Foil M.D.    On: 12/03/2021 20:38    I reviewed patient's prior imaging.  His last CT abdomen pelvis was a noncontrasted study done  06/07/2021 IMPRESSION: No acute abnormality or findings to account for reported symptoms.  Cardiac Monitoring:  The patient was maintained on a cardiac monitor.  I personally viewed and interpreted the cardiac monitored which showed an underlying rhythm of: NSR NA   Medicines ordered:  I ordered medication including morphine 6 mg, Zofran, Dilaudid for pain and nausea Reevaluation of the patient after these medicines showed that the patient improved I have reviewed the patients home medicines and have made adjustments as needed   Critical Interventions:     Consults/Attending Physician   I requested consultation with Dr. Simona Huh,  and discussed lab and imaging findings as well as pertinent plan -  they recommend: Liquid diet, pain medicine, will be in touch with Dr. Henrene Pastor who sees patient primarily at [facility].  I will provide the patient with recommendations and will prescribe patient Percocet to go home with as well as Zofran and Tylenol.  I also recommended to the patient that he call [facility] in the morning to close the loop.  Reevaluation:  After the interventions noted above I re-evaluated patient and found that they have :improved   Social Determinants of Health:      Problem List / ED Course:     Dispostion:  After consideration of the diagnostic results and the patients response to treatment, I feel that the patent would benefit from very close outpatient follow-up.       Final Clinical Impression(s) / ED Diagnoses Final diagnoses:  Epigastric abdominal pain    Rx / DC Orders ED Discharge Orders          Ordered    ondansetron (ZOFRAN-ODT) 4 MG disintegrating tablet  Every 8 hours PRN        12/03/21 2203    oxyCODONE-acetaminophen (PERCOCET/ROXICET) 5-325 MG tablet  Every 6 hours PRN        12/03/21 2203    acetaminophen (TYLENOL) 325 MG tablet  Every 6 hours PRN        12/03/21 2203              Tedd Sias, [place] 12/03/21 2205     Davonna Belling, MD 12/06/21 1424

## 2021-12-04 ENCOUNTER — Encounter: Payer: Self-pay | Admitting: Internal Medicine

## 2021-12-04 ENCOUNTER — Other Ambulatory Visit: Payer: Self-pay

## 2021-12-04 DIAGNOSIS — R110 Nausea: Secondary | ICD-10-CM

## 2021-12-04 DIAGNOSIS — G8929 Other chronic pain: Secondary | ICD-10-CM

## 2021-12-04 DIAGNOSIS — K8050 Calculus of bile duct without cholangitis or cholecystitis without obstruction: Secondary | ICD-10-CM

## 2021-12-04 DIAGNOSIS — R1011 Right upper quadrant pain: Secondary | ICD-10-CM

## 2021-12-04 MED ORDER — AMOXICILLIN-POT CLAVULANATE 875-125 MG PO TABS: 1.0000 | ORAL_TABLET | Freq: Two times a day (BID) | ORAL | 0 refills | Status: DC

## 2021-12-04 NOTE — Telephone Encounter (Signed)
When I entered the order for Cipro a warning comes up that states it causes a prolonged QT wave. Please advise.

## 2021-12-04 NOTE — Telephone Encounter (Signed)
Then Augmentin 875 mg twice daily for 1 week

## 2021-12-04 NOTE — Telephone Encounter (Signed)
[name], Reviewed. It looks like he has acute on chronic pancreatitis.  Also, progressive biliary dilation with questionable choledocholithiasis.  Normal liver tests. 1.  Given his history of cholangitis with cirrhosis and white count of 17,000, please empirically place him on ciprofloxacin 500 mg twice daily for 1 week 2.  Liver tests, lipase, and CBC in 1 week 3.  Schedule MRCP.  Rule out choledocholithiasis 4.  Schedule him to see me in the office July 25 at 11:40 AM (my next available) 5.  In the interim, if he has progressive pain or any other worrisome issues such as fever, proceed to the emergency room ASAP. Thanks Dr. Henrene Pastor

## 2021-12-04 NOTE — Telephone Encounter (Signed)
See mychart note. Pt aware and script sent to pharmacy. MRCP ordered, rad scheduling to notify pt. Labs ordered.

## 2021-12-07 ENCOUNTER — Encounter (HOSPITAL_BASED_OUTPATIENT_CLINIC_OR_DEPARTMENT_OTHER): Payer: Self-pay | Admitting: Emergency Medicine

## 2021-12-07 ENCOUNTER — Other Ambulatory Visit: Payer: Self-pay

## 2021-12-07 DIAGNOSIS — R1011 Right upper quadrant pain: Secondary | ICD-10-CM | POA: Insufficient documentation

## 2021-12-07 DIAGNOSIS — I10 Essential (primary) hypertension: Secondary | ICD-10-CM | POA: Diagnosis not present

## 2021-12-07 DIAGNOSIS — G8929 Other chronic pain: Secondary | ICD-10-CM | POA: Insufficient documentation

## 2021-12-07 DIAGNOSIS — Z79899 Other long term (current) drug therapy: Secondary | ICD-10-CM | POA: Diagnosis not present

## 2021-12-07 LAB — CBC
HCT: 45.6 % (ref 39.0–52.0)
Hemoglobin: 16.1 g/dL (ref 13.0–17.0)
MCH: 30.2 pg (ref 26.0–34.0)
MCHC: 35.3 g/dL (ref 30.0–36.0)
MCV: 85.6 fL (ref 80.0–100.0)
Platelets: 241 10*3/uL (ref 150–400)
RBC: 5.33 MIL/uL (ref 4.22–5.81)
RDW: 12.1 % (ref 11.5–15.5)
WBC: 9.9 10*3/uL (ref 4.0–10.5)
nRBC: 0 % (ref 0.0–0.2)

## 2021-12-07 NOTE — ED Triage Notes (Signed)
Recently (Sunday) seen for mild pancreatitis. Now endorsing increase abd pain  n/v  Reports fevers at home.

## 2021-12-08 ENCOUNTER — Emergency Department (HOSPITAL_BASED_OUTPATIENT_CLINIC_OR_DEPARTMENT_OTHER)
Admission: EM | Admit: 2021-12-08 | Discharge: 2021-12-08 | Disposition: A | Discharge status: Home / Self Care | Payer: No Typology Code available for payment source | Attending: Emergency Medicine | Admitting: Emergency Medicine

## 2021-12-08 DIAGNOSIS — G8929 Other chronic pain: Secondary | ICD-10-CM

## 2021-12-08 DIAGNOSIS — R1011 Right upper quadrant pain: Secondary | ICD-10-CM

## 2021-12-08 LAB — COMPREHENSIVE METABOLIC PANEL
ALT: 23 U/L (ref 0–44)
AST: 18 U/L (ref 15–41)
Albumin: 4.7 g/dL (ref 3.5–5.0)
Alkaline Phosphatase: 70 U/L (ref 38–126)
Anion gap: 11 (ref 5–15)
BUN: 11 mg/dL (ref 6–20)
CO2: 20 mmol/L — ABNORMAL LOW (ref 22–32)
Calcium: 9.8 mg/dL (ref 8.9–10.3)
Chloride: 105 mmol/L (ref 98–111)
Creatinine, Ser: 0.92 mg/dL (ref 0.61–1.24)
GFR, Estimated: >60 mL/min (ref >60)
Glucose, Bld: 95 mg/dL (ref 70–99)
Potassium: 3.5 mmol/L (ref 3.5–5.1)
Sodium: 136 mmol/L (ref 135–145)
Total Bilirubin: 0.8 mg/dL (ref 0.3–1.2)
Total Protein: 8 g/dL (ref 6.5–8.1)

## 2021-12-08 LAB — LIPASE, BLOOD: Lipase: 53 U/L — ABNORMAL HIGH (ref 11–51)

## 2021-12-08 MED ORDER — LACTATED RINGERS IV BOLUS
1000.0000 mL | Freq: Once | INTRAVENOUS | Status: AC
  Administered 2021-12-08: 1000 mL via INTRAVENOUS

## 2021-12-08 MED ORDER — SODIUM CHLORIDE 0.9 % IV SOLN
25.0000 mg | Freq: Four times a day (QID) | INTRAVENOUS | Status: DC | PRN
  Administered 2021-12-08: 25 mg via INTRAVENOUS
  Filled 2021-12-08: qty 1

## 2021-12-08 MED ORDER — HYDROMORPHONE HCL 1 MG/ML IJ SOLN
1.0000 mg | Freq: Once | INTRAMUSCULAR | Status: AC
  Administered 2021-12-08: 1 mg via INTRAVENOUS
  Filled 2021-12-08: qty 1

## 2021-12-08 MED ORDER — PROMETHAZINE HCL 25 MG RE SUPP: 25.0000 mg | Freq: Four times a day (QID) | RECTAL | 0 refills | Status: DC | PRN

## 2021-12-08 MED ORDER — OXYCODONE HCL 5 MG PO TABS: 5.0000 mg | ORAL_TABLET | ORAL | 0 refills | Status: DC | PRN

## 2021-12-08 NOTE — Discharge Instructions (Addendum)
I had recommended hospitalization, but she wanted to try to manage your pain at home.  You have been given a prescription for small number of oxycodone tablets.  You may take this as needed.  I have also given you a prescription for promethazine (Phenergan) suppository so that you can get the medicine in your system even if you are throwing up.  Should pain not be adequately controlled at home, please return to the emergency department so that we can arrange to admit you to the hospital.  Otherwise, follow-up with your gastroenterologist.

## 2021-12-08 NOTE — ED Provider Notes (Signed)
[facility] EMERGENCY DEPT Provider Note   CSN: [id number] Arrival date & time: 12/07/21  2250     History  Chief Complaint  Patient presents with   Abdominal Pain    [name] is a 46 y.o. male.  The history is provided by the patient.  Abdominal Pain He has history of hypertension, hyperlipidemia, chronic pancreatitis and comes in with recurrent right upper quadrant pain with some radiation to the back with associated nausea and vomiting.  He had been in the ED 4 days ago with an exacerbation of his pancreatitis and felt better upon discharge.  He had recurrence of pain and nausea and vomiting yesterday which got worse today.  He denies fever, chills, sweats.  Denies any ethanol consumption.  His CT scan during the ED visit did show dilated bile ducts and his gastroenterologist has put him on antibiotics for that and scheduled an MRCP for 7/21.   Home Medications Prior to Admission medications   Medication Sig Start Date End Date Taking? Authorizing Provider  acetaminophen (TYLENOL) 325 MG tablet Take 2 tablets (650 mg total) by mouth every 6 (six) hours as needed. 12/03/21   Tedd Sias, PA  amLODipine (NORVASC) 5 MG tablet TAKE 1 TABLET (5 MG TOTAL) BY MOUTH DAILY. 10/30/21   [place], Betty G, MD  amoxicillin-clavulanate (AUGMENTIN) 875-125 MG tablet Take 1 tablet by mouth 2 (two) times daily. 12/04/21   Irene Shipper, MD  benazepril (LOTENSIN) 40 MG tablet TAKE 1 TABLET BY MOUTH EVERY DAY 10/30/21   [place], Betty G, MD  fluticasone [facility]) 50 MCG/ACT nasal spray PLACE 1 SPRAY INTO BOTH NOSTRILS 2 (TWO) TIMES DAILY 10/30/21   [place], Betty G, MD  KLOR-CON M20 20 MEQ tablet TAKE 1 TABLET BY MOUTH EVERY DAY 10/10/21   [place], Betty G, MD  metoprolol succinate (TOPROL-XL) 100 MG 24 hr tablet TAKE 1 TABLET BY MOUTH EVERY DAY WITH OR IMMEDIATELY FOLLOWING A MEAL 10/30/21   [place], Betty G, MD  ondansetron (ZOFRAN-ODT) 4 MG disintegrating tablet Take 1 tablet (4 mg total)  by mouth every 8 (eight) hours as needed for nausea or vomiting. 12/03/21   Tedd Sias, PA  oxyCODONE-acetaminophen (PERCOCET/ROXICET) 5-325 MG tablet Take 1 tablet by mouth every 6 (six) hours as needed for severe pain. 12/03/21   Tedd Sias, PA  promethazine (PHENERGAN) 25 MG tablet Take 1 tablet (25 mg total) by mouth every 6 (six) hours as needed for nausea or vomiting. 07/12/21   Irene Shipper, MD  rosuvastatin (CRESTOR) 10 MG tablet TAKE 1 TABLET BY MOUTH EVERY DAY 09/18/21   [place], Betty G, MD      Allergies    Cortisone, Eggs or egg-derived products, Ketorolac, Ketorolac tromethamine, Haldol [haloperidol lactate], Haloperidol, Hydrocortisone, Iodinated contrast media, Ketorolac tromethamine, Reglan [metoclopramide], Compazine [prochlorperazine], and Droperidol    Review of Systems   Review of Systems  Gastrointestinal:  Positive for abdominal pain.  All other systems reviewed and are negative.   Physical Exam Updated Vital Signs BP 134/89   Pulse (!) 52   Temp 98.5 F (36.9 C)   Resp 16   SpO2 96%  Physical Exam Vitals and nursing note reviewed.   46 year old male, resting comfortably and in no acute distress. Vital signs are significant for slightly slow heart rate. Oxygen saturation is 96%, which is normal. Head is normocephalic and atraumatic. PERRLA, EOMI. Oropharynx is clear. Neck is nontender and supple without adenopathy or JVD. Back is nontender  and there is no CVA tenderness. Lungs are clear without rales, wheezes, or rhonchi. Chest is nontender. Heart has regular rate and rhythm without murmur. Abdomen is soft, flat, with moderate right upper quadrant tenderness.  There is no rebound or guarding.  Peristalsis is normoactive Extremities have no cyanosis or edema, full range of motion is present. Skin is warm and dry without rash. Neurologic: Mental status is normal, cranial nerves are intact, moves all extremities equally.  ED Results / Procedures /  Treatments   Labs (all labs ordered are listed, but only abnormal results are displayed) Labs Reviewed  LIPASE, BLOOD - Abnormal; Notable for the following components:      Result Value   Lipase 53 (*)    All other components within normal limits  COMPREHENSIVE METABOLIC PANEL - Abnormal; Notable for the following components:   CO2 20 (*)    All other components within normal limits  CBC    EKG Interpretation  Date/Time:  Friday December 08 2021 02:18:42 EDT Ventricular Rate:  56 PR Interval:  172 QRS Duration: 97 QT Interval:  447 QTC Calculation: 432 R Axis:   41 Text Interpretation: Sinus rhythm Low voltage, precordial leads When compared with ECG of 09/15/2021, No significant change was found Confirmed by Delora Fuel ([id number]) on 12/08/2021 6:38:42 AM        Procedures Procedures    Medications Ordered in ED Medications  promethazine (PHENERGAN) 25 mg in sodium chloride 0.9 % 50 mL IVPB (0 mg Intravenous Stopped 12/08/21 0303)  lactated ringers bolus 1,000 mL (0 mLs Intravenous Stopped 12/08/21 0334)  HYDROmorphone (DILAUDID) injection 1 mg (1 mg Intravenous Given 12/08/21 0225)  HYDROmorphone (DILAUDID) injection 1 mg (1 mg Intravenous Given 12/08/21 0339)  HYDROmorphone (DILAUDID) injection 1 mg (1 mg Intravenous Given 12/08/21 3546)    ED Course/ Medical Decision Making/ A&P                           Medical Decision Making Amount and/or Complexity of Data Reviewed Labs: ordered.  Risk Prescription drug management.   Right upper quadrant pain with vomiting in patient with history of chronic pancreatitis as well as chronic abdominal pain.  Differential diagnosis includes, but is not limited to, exacerbation of chronic pancreatitis, diverticulitis, cholangitis, chronic pain syndrome.  I have reviewed his laboratory tests and my interpretation is minimal elevation of lipase which is probably not clinically significant, borderline low total CO2 which is also not felt to be  clinically significant.  Anion gap is normal.  Transaminases and bilirubin are normal.  Old records are reviewed, and he has had 12 ED visits in the last 6 months for abdominal pain.  On 12/03/2021 he had an ED visit for abdominal pain which was felt to be an exacerbation of chronic pancreatitis with lipase elevated to 103.  CT scan did show common bile duct dilatation up to 15 mm.  On 12/04/2021, his gastroenterologist gave him a prescription for amoxicillin-clavulanate and scheduled the MRCP.  I have ordered IV fluids, IV hydromorphone for pain, IV promethazine for nausea and vomiting.  He apparently does have history of prolonged QT interval, I have ordered a twelve-lead ECG to evaluate the QT interval.  Last ECG on record was on 09/18/2021, and QTc was normal at that time.  I have reviewed and interpreted the ECG, and my interpretation is normal QT interval, low voltage.  He did not get sufficient pain relief with hydromorphone, but nausea  has improved following promethazine.  He states that he wishes to go home if possible.  I have given him prescriptions for promethazine suppository and a small number of oxycodone tablets.  He is to follow-up with his gastroenterologist and primary care provider, return to the emergency department if symptoms or not being adequately controlled at home.  Final Clinical Impression(s) / ED Diagnoses Final diagnoses:  Right upper quadrant pain  Chronic abdominal pain    Rx / DC Orders ED Discharge Orders          Ordered    oxyCODONE (ROXICODONE) 5 MG immediate release tablet  Every 4 hours PRN        12/08/21 0635    promethazine (PHENERGAN) 25 MG suppository  Every 6 hours PRN        12/08/21 9169              Delora Fuel, MD 45/03/88 [phone]

## 2021-12-11 ENCOUNTER — Observation Stay (HOSPITAL_COMMUNITY): Payer: No Typology Code available for payment source

## 2021-12-11 ENCOUNTER — Other Ambulatory Visit: Payer: Self-pay

## 2021-12-11 ENCOUNTER — Inpatient Hospital Stay (HOSPITAL_BASED_OUTPATIENT_CLINIC_OR_DEPARTMENT_OTHER)
Admission: EM | Admit: 2021-12-11 | Discharge: 2021-12-14 | DRG: 445 | Disposition: A | Discharge status: Home / Self Care | Payer: No Typology Code available for payment source | Attending: Internal Medicine | Admitting: Internal Medicine

## 2021-12-11 ENCOUNTER — Encounter (HOSPITAL_BASED_OUTPATIENT_CLINIC_OR_DEPARTMENT_OTHER): Payer: Self-pay

## 2021-12-11 DIAGNOSIS — F17210 Nicotine dependence, cigarettes, uncomplicated: Secondary | ICD-10-CM | POA: Diagnosis present

## 2021-12-11 DIAGNOSIS — E876 Hypokalemia: Secondary | ICD-10-CM | POA: Diagnosis present

## 2021-12-11 DIAGNOSIS — Z8249 Family history of ischemic heart disease and other diseases of the circulatory system: Secondary | ICD-10-CM

## 2021-12-11 DIAGNOSIS — K861 Other chronic pancreatitis: Secondary | ICD-10-CM

## 2021-12-11 DIAGNOSIS — K8051 Calculus of bile duct without cholangitis or cholecystitis with obstruction: Principal | ICD-10-CM | POA: Diagnosis present

## 2021-12-11 DIAGNOSIS — R109 Unspecified abdominal pain: Secondary | ICD-10-CM | POA: Diagnosis present

## 2021-12-11 DIAGNOSIS — Z91041 Radiographic dye allergy status: Secondary | ICD-10-CM | POA: Diagnosis not present

## 2021-12-11 DIAGNOSIS — E785 Hyperlipidemia, unspecified: Secondary | ICD-10-CM | POA: Diagnosis not present

## 2021-12-11 DIAGNOSIS — Z9049 Acquired absence of other specified parts of digestive tract: Secondary | ICD-10-CM | POA: Diagnosis not present

## 2021-12-11 DIAGNOSIS — K7460 Unspecified cirrhosis of liver: Secondary | ICD-10-CM | POA: Diagnosis not present

## 2021-12-11 DIAGNOSIS — Z83438 Family history of other disorder of lipoprotein metabolism and other lipidemia: Secondary | ICD-10-CM

## 2021-12-11 DIAGNOSIS — Z91012 Allergy to eggs: Secondary | ICD-10-CM

## 2021-12-11 DIAGNOSIS — R7989 Other specified abnormal findings of blood chemistry: Secondary | ICD-10-CM

## 2021-12-11 DIAGNOSIS — Z888 Allergy status to other drugs, medicaments and biological substances status: Secondary | ICD-10-CM | POA: Diagnosis not present

## 2021-12-11 DIAGNOSIS — Z87442 Personal history of urinary calculi: Secondary | ICD-10-CM | POA: Diagnosis not present

## 2021-12-11 DIAGNOSIS — I10 Essential (primary) hypertension: Secondary | ICD-10-CM | POA: Diagnosis not present

## 2021-12-11 DIAGNOSIS — Z886 Allergy status to analgesic agent status: Secondary | ICD-10-CM

## 2021-12-11 DIAGNOSIS — F40240 Claustrophobia: Secondary | ICD-10-CM | POA: Diagnosis present

## 2021-12-11 DIAGNOSIS — R112 Nausea with vomiting, unspecified: Secondary | ICD-10-CM | POA: Diagnosis present

## 2021-12-11 DIAGNOSIS — Z79899 Other long term (current) drug therapy: Secondary | ICD-10-CM

## 2021-12-11 LAB — CBC WITH DIFFERENTIAL/PLATELET
Abs Immature Granulocytes: 0.03 10*3/uL (ref 0.00–0.07)
Basophils Absolute: 0.1 10*3/uL (ref 0.0–0.1)
Basophils Relative: 1 %
Eosinophils Absolute: 0.3 10*3/uL (ref 0.0–0.5)
Eosinophils Relative: 4 %
HCT: 42.9 % (ref 39.0–52.0)
Hemoglobin: 15.5 g/dL (ref 13.0–17.0)
Immature Granulocytes: 0 %
Lymphocytes Relative: 30 %
Lymphs Abs: 2.6 10*3/uL (ref 0.7–4.0)
MCH: 30.9 pg (ref 26.0–34.0)
MCHC: 36.1 g/dL — ABNORMAL HIGH (ref 30.0–36.0)
MCV: 85.5 fL (ref 80.0–100.0)
Monocytes Absolute: 1 10*3/uL (ref 0.1–1.0)
Monocytes Relative: 11 %
Neutro Abs: 4.8 10*3/uL (ref 1.7–7.7)
Neutrophils Relative %: 54 %
Platelets: 236 10*3/uL (ref 150–400)
RBC: 5.02 MIL/uL (ref 4.22–5.81)
RDW: 12.1 % (ref 11.5–15.5)
WBC: 8.8 10*3/uL (ref 4.0–10.5)
nRBC: 0 % (ref 0.0–0.2)

## 2021-12-11 LAB — COMPREHENSIVE METABOLIC PANEL
ALT: 20 U/L (ref 0–44)
AST: 18 U/L (ref 15–41)
Albumin: 5 g/dL (ref 3.5–5.0)
Alkaline Phosphatase: 83 U/L (ref 38–126)
Anion gap: 9 (ref 5–15)
BUN: 14 mg/dL (ref 6–20)
CO2: 25 mmol/L (ref 22–32)
Calcium: 9.8 mg/dL (ref 8.9–10.3)
Chloride: 104 mmol/L (ref 98–111)
Creatinine, Ser: 0.96 mg/dL (ref 0.61–1.24)
GFR, Estimated: >60 mL/min (ref >60)
Glucose, Bld: 108 mg/dL — ABNORMAL HIGH (ref 70–99)
Potassium: 3.4 mmol/L — ABNORMAL LOW (ref 3.5–5.1)
Sodium: 138 mmol/L (ref 135–145)
Total Bilirubin: 0.6 mg/dL (ref 0.3–1.2)
Total Protein: 7.7 g/dL (ref 6.5–8.1)

## 2021-12-11 LAB — LIPASE, BLOOD: Lipase: 34 U/L (ref 11–51)

## 2021-12-11 MED ORDER — LORAZEPAM 2 MG/ML IJ SOLN
0.5000 mg | INTRAMUSCULAR | Status: DC | PRN
Start: 2021-12-11 — End: 2021-12-14
  Administered 2021-12-11 – 2021-12-14 (×13): 0.5 mg via INTRAVENOUS
  Filled 2021-12-11 (×13): qty 1

## 2021-12-11 MED ORDER — HYDROMORPHONE HCL 1 MG/ML IJ SOLN
1.0000 mg | Freq: Once | INTRAMUSCULAR | Status: AC
  Administered 2021-12-11: 1 mg via INTRAVENOUS
  Filled 2021-12-11: qty 1

## 2021-12-11 MED ORDER — GADOBUTROL 1 MMOL/ML IV SOLN
10.0000 mL | Freq: Once | INTRAVENOUS | Status: AC | PRN
  Administered 2021-12-11: 10 mL via INTRAVENOUS

## 2021-12-11 MED ORDER — DIPHENHYDRAMINE HCL 50 MG/ML IJ SOLN
50.0000 mg | Freq: Once | INTRAMUSCULAR | Status: AC
  Administered 2021-12-11: 50 mg via INTRAVENOUS
  Filled 2021-12-11: qty 1

## 2021-12-11 MED ORDER — ORAL CARE MOUTH RINSE
15.0000 mL | OROMUCOSAL | Status: DC | PRN
Start: 2021-12-11 — End: 2021-12-14

## 2021-12-11 MED ORDER — SODIUM CHLORIDE 0.9 % IV SOLN: INTRAVENOUS | Status: DC

## 2021-12-11 MED ORDER — POTASSIUM CHLORIDE IN NACL 40-0.9 MEQ/L-% IV SOLN
INTRAVENOUS | Status: DC
  Filled 2021-12-11 (×2): qty 1000

## 2021-12-11 MED ORDER — ALPRAZOLAM 0.5 MG PO TABS: 1.0000 mg | ORAL_TABLET | Freq: Once | ORAL | Status: DC

## 2021-12-11 MED ORDER — LORAZEPAM 2 MG/ML IJ SOLN
0.5000 mg | Freq: Four times a day (QID) | INTRAMUSCULAR | Status: DC | PRN
Start: 2021-12-11 — End: 2021-12-11
  Administered 2021-12-11: 0.5 mg via INTRAVENOUS
  Filled 2021-12-11: qty 1

## 2021-12-11 MED ORDER — POTASSIUM CHLORIDE IN NACL 20-0.9 MEQ/L-% IV SOLN
INTRAVENOUS | Status: AC
  Filled 2021-12-11 (×2): qty 1000

## 2021-12-11 MED ORDER — ONDANSETRON HCL 4 MG/2ML IJ SOLN
4.0000 mg | Freq: Four times a day (QID) | INTRAMUSCULAR | Status: DC | PRN
  Administered 2021-12-12 – 2021-12-14 (×5): 4 mg via INTRAVENOUS
  Filled 2021-12-11 (×4): qty 2

## 2021-12-11 MED ORDER — HYDROMORPHONE HCL 1 MG/ML IJ SOLN
1.0000 mg | INTRAMUSCULAR | Status: DC | PRN
  Administered 2021-12-11 – 2021-12-13 (×10): 1 mg via INTRAVENOUS
  Filled 2021-12-11 (×11): qty 1

## 2021-12-11 MED ORDER — METOCLOPRAMIDE HCL 5 MG/ML IJ SOLN
10.0000 mg | Freq: Once | INTRAMUSCULAR | Status: AC
  Administered 2021-12-11: 10 mg via INTRAVENOUS
  Filled 2021-12-11: qty 2

## 2021-12-11 NOTE — ED Notes (Addendum)
Report given to [facility] Report sent secure chat to floor nurse

## 2021-12-11 NOTE — Plan of Care (Signed)

## 2021-12-11 NOTE — H&P (Signed)
History and Physical    [name] FBP:[id number] DOB: [birth year] DOA: 12/11/2021  PCP: Martinique, Betty G, MD Patient coming from: Home  Chief Complaint: Abdominal pain and nausea for the past 1 week  HPI: [name] is a 46 y.o. male with medical history significant of chronic biliary pancreatitis, cholangitis, cirrhosis, hypertension admitted with severe abdominal upper abdominal pain for the past 1 week associated with nausea and vomiting.  Denies diarrhea.  Patient has seen [facility] as an outpatient and was planning to have MRCP done. Patient had appointment to see Dr. Henrene Pastor at the office on July 25. He denies fever chills dysuria chest pain shortness of breath or cough. No complaints of GERD.  No history of smoking alcohol use or illegal drug use. CT abdomen 12/03/2021 pneumobilia felt related to prior history of sphincterotomy interval increase in intra and extrahepatic biliary dilatation with common bile duct dilatation up to 15 mm, raising concern for acute obstruction.  Possible debris's or noncalcified stone in the distal common bile duct.  Mild dilatation of the proximal pancreatic duct. He reports he is very claustrophobic and requesting something to decrease anxiety prior to MRI.  ED Course: He received Dilaudid, Zofran, IV fluids.  Review of Systems: As per HPI otherwise all other systems reviewed and are negative  Ambulatory Status: Ambulatory at baseline  Past Medical History:  Diagnosis Date   Chronic abdominal pain    Diverticulitis    Drug-seeking behavior    Hypertension    Kidney stones    Pancreatitis, chronic ([place]) 05/28/2005    Past Surgical History:  Procedure Laterality Date   APPENDECTOMY     CHOLECYSTECTOMY     ERCP N/A 12/30/2019   Procedure: ENDOSCOPIC RETROGRADE CHOLANGIOPANCREATOGRAPHY (ERCP);  Surgeon: Milus Banister, MD;  Location: [facility];  Service: Endoscopy;  Laterality: N/A;   ERCP N/A 03/04/2021   Procedure: ENDOSCOPIC  RETROGRADE CHOLANGIOPANCREATOGRAPHY (ERCP);  Surgeon: Carol Ada, MD;  Location: [facility];  Service: Endoscopy;  Laterality: N/A;   ERCP W/ METAL STENT PLACEMENT     KIDNEY STONE SURGERY     REMOVAL OF STONES  12/30/2019   Procedure: REMOVAL OF STONES;  Surgeon: Milus Banister, MD;  Location: [facility];  Service: Endoscopy;;   REMOVAL OF STONES  03/04/2021   Procedure: REMOVAL OF STONES;  Surgeon: Carol Ada, MD;  Location: [facility];  Service: Endoscopy;;   SPHINCTEROTOMY  03/04/2021   Procedure: Joan Mayans;  Surgeon: Carol Ada, MD;  Location: [facility];  Service: Endoscopy;;    Social History   Socioeconomic History   Marital status: Married    Spouse name: Not on file   Number of children: 1   Years of education: Not on file   Highest education level: Some college, no degree  Occupational History   Not on file  Tobacco Use   Smoking status: Every Day    Packs/day: 0.50    Types: Cigarettes   Smokeless tobacco: Never  Vaping Use   Vaping Use: Never used  Substance and Sexual Activity   Alcohol use: Not Currently   Drug use: No   Sexual activity: Not on file  Other Topics Concern   Not on file  Social History Narrative   Not on file   Social Determinants of Health   Financial Resource Strain: Low Risk  (04/03/2021)   Overall Financial Resource Strain (CARDIA)    Difficulty of Paying Living Expenses: Not hard at all  Food Insecurity: No Food Insecurity (04/03/2021)  Hunger Vital Sign    Worried About Running Out of Food in the Last Year: Never true    Ran Out of Food in the Last Year: Never true  Transportation Needs: No Transportation Needs (04/03/2021)   PRAPARE - Hydrologist (Medical): No    Lack of Transportation (Non-Medical): No  Physical Activity: Insufficiently Active (04/03/2021)   Exercise Vital Sign    Days of Exercise per Week: 1 day    Minutes of Exercise per Session: 20 min  Stress: No Stress  Concern Present (04/03/2021)   [place]    Feeling of Stress : Only a little  Social Connections: Socially Integrated (04/03/2021)   Social Connection and Isolation Panel [NHANES]    Frequency of Communication with Friends and Family: Three times a week    Frequency of Social Gatherings with Friends and Family: Once a week    Attends Religious Services: More than 4 times per year    Active Member of [employer] or Organizations: Yes    Attends Music therapist: More than 4 times per year    Marital Status: Married  Human resources officer Violence: Not on file    Allergies  Allergen Reactions   Cortisone Other (See Comments)    drops potassium level "bottoms out" potassium level drops potassium level Bottoms out potassium  "bottoms out" potassium level Other reaction(s): Other (See Comments) Bottoms out potassium   Eggs Or Egg-Derived Products Hives and Other (See Comments)    Rash  Rash   Ketorolac Hives and Other (See Comments)    Hives, slightly labored breathing  Hives, slightly labored breathing   Ketorolac Tromethamine Hives and Shortness Of Breath   Haldol [Haloperidol Lactate] Other (See Comments)    "jittery"    Haloperidol Other (See Comments)    "jittery" "jittery"    Hydrocortisone Hives    Also drops potassium level   Iodinated Contrast Media Nausea And Vomiting and Other (See Comments)    Hives Hives Hives Hives   Ketorolac Tromethamine Hives   Reglan [Metoclopramide]     Face "draws" and gets figgety   Compazine [Prochlorperazine] Other (See Comments)    Dystonic reaction   Droperidol Other (See Comments)    Makes face draw up    Family History  Problem Relation Age of Onset   Breast cancer Mother    Early death Mother    Hypertension Mother    Heart failure Father    Hypertension Father    Heart disease Father    Hyperlipidemia Father    Pancreatic cancer Paternal  Grandmother        possibly   Pancreatic cancer Paternal Aunt    Colon cancer Neg Hx       Prior to Admission medications   Medication Sig Start Date End Date Taking? Authorizing Provider  acetaminophen (TYLENOL) 325 MG tablet Take 2 tablets (650 mg total) by mouth every 6 (six) hours as needed. 12/03/21   Tedd Sias, PA  amLODipine (NORVASC) 5 MG tablet TAKE 1 TABLET (5 MG TOTAL) BY MOUTH DAILY. 10/30/21   Martinique, Betty G, MD  amoxicillin-clavulanate (AUGMENTIN) 875-125 MG tablet Take 1 tablet by mouth 2 (two) times daily. 12/04/21   Irene Shipper, MD  benazepril (LOTENSIN) 40 MG tablet TAKE 1 TABLET BY MOUTH EVERY DAY 10/30/21   Martinique, Betty G, MD  fluticasone (FLONASE) 50 MCG/ACT nasal spray PLACE 1 SPRAY INTO BOTH NOSTRILS 2 (  TWO) TIMES DAILY 10/30/21   Martinique, Betty G, MD  KLOR-CON M20 20 MEQ tablet TAKE 1 TABLET BY MOUTH EVERY DAY 10/10/21   Martinique, Betty G, MD  metoprolol succinate (TOPROL-XL) 100 MG 24 hr tablet TAKE 1 TABLET BY MOUTH EVERY DAY WITH OR IMMEDIATELY FOLLOWING A MEAL 10/30/21   Martinique, Betty G, MD  ondansetron (ZOFRAN-ODT) 4 MG disintegrating tablet Take 1 tablet (4 mg total) by mouth every 8 (eight) hours as needed for nausea or vomiting. 12/03/21   Tedd Sias, PA  oxyCODONE (ROXICODONE) 5 MG immediate release tablet Take 1 tablet (5 mg total) by mouth every 4 (four) hours as needed for severe pain. 0/27/25   Delora Fuel, MD  oxyCODONE-acetaminophen (PERCOCET/ROXICET) 5-325 MG tablet Take 1 tablet by mouth every 6 (six) hours as needed for severe pain. 12/03/21   Tedd Sias, PA  promethazine (PHENERGAN) 25 MG suppository Place 1 suppository (25 mg total) rectally every 6 (six) hours as needed for nausea or vomiting. 3/66/44   Delora Fuel, MD  promethazine (PHENERGAN) 25 MG tablet Take 1 tablet (25 mg total) by mouth every 6 (six) hours as needed for nausea or vomiting. 07/12/21   Irene Shipper, MD  rosuvastatin (CRESTOR) 10 MG tablet TAKE 1 TABLET BY MOUTH EVERY DAY  09/18/21   Martinique, Betty G, MD    Physical Exam: Vitals:   12/11/21 1145 12/11/21 1500 12/11/21 1515 12/11/21 1642  BP: (!) 126/95 (!) 144/92  (!) 151/95  Pulse: 61 61  (!) 59  Resp: '17 15  17  '$ Temp:   97.7 F (36.5 C) 98.2 F (36.8 C)  TempSrc:   Oral Oral  SpO2: 94% 93%  94%  Weight:      Height:         General:  Appears in mild distress due to pain and nausea Eyes:  PERRL, EOMI, normal lids, iris ENT:  grossly normal hearing, lips & tongue, mmm Neck:  no LAD, masses or thyromegaly Cardiovascular:  RRR, no m/r/g. No LE edema.  Respiratory:  CTA bilaterally, no w/r/r. Normal respiratory effort. Abdomen: Soft tender in the upper abdomen  skin:  no rash or induration seen on limited exam Musculoskeletal: grossly normal tone BUE/BLE, good ROM, no bony abnormality Psychiatric:  grossly normal mood and affect, speech fluent and appropriate, AOx3 Neurologic:  CN 2-12 grossly intact, moves all extremities in coordinated fashion, sensation intact  Labs on Admission: I have personally reviewed following labs and imaging studies  CBC: Recent Labs  Lab 12/07/21 2312 12/11/21 1000  WBC 9.9 8.8  NEUTROABS  --  4.8  HGB 16.1 15.5  HCT 45.6 42.9  MCV 85.6 85.5  PLT 241 034   Basic Metabolic Panel: Recent Labs  Lab 12/07/21 2312 12/11/21 1000  NA 136 138  K 3.5 3.4*  CL 105 104  CO2 20* 25  GLUCOSE 95 108*  BUN 11 14  CREATININE 0.92 0.96  CALCIUM 9.8 9.8   GFR: Estimated Creatinine Clearance: 120.7 mL/min (by C-G formula based on SCr of 0.96 mg/dL). Liver Function Tests: Recent Labs  Lab 12/07/21 2312 12/11/21 1000  AST 18 18  ALT 23 20  ALKPHOS 70 83  BILITOT 0.8 0.6  PROT 8.0 7.7  ALBUMIN 4.7 5.0   Recent Labs  Lab 12/07/21 2312 12/11/21 1000  LIPASE 53* 34   No results for input(s): "AMMONIA" in the last 168 hours. Coagulation Profile: No results for input(s): "INR", "PROTIME" in the last 168 hours. Cardiac  Enzymes: No results for input(s):  "CKTOTAL", "CKMB", "CKMBINDEX", "TROPONINI" in the last 168 hours. BNP (last 3 results) No results for input(s): "PROBNP" in the last 8760 hours. HbA1C: No results for input(s): "HGBA1C" in the last 72 hours. CBG: No results for input(s): "GLUCAP" in the last 168 hours. Lipid Profile: No results for input(s): "CHOL", "HDL", "LDLCALC", "TRIG", "CHOLHDL", "LDLDIRECT" in the last 72 hours. Thyroid Function Tests: No results for input(s): "TSH", "T4TOTAL", "FREET4", "T3FREE", "THYROIDAB" in the last 72 hours. Anemia Panel: No results for input(s): "VITAMINB12", "FOLATE", "FERRITIN", "TIBC", "IRON", "RETICCTPCT" in the last 72 hours. Urine analysis:    Component Value Date/Time   COLORURINE YELLOW 07/25/2021 0801   APPEARANCEUR CLEAR 07/25/2021 0801   LABSPEC >=1.030 07/25/2021 0801   PHURINE 5.5 07/25/2021 0801   GLUCOSEU NEGATIVE 07/25/2021 0801   GLUCOSEU NEGATIVE 04/18/2018 1150   HGBUR NEGATIVE 07/25/2021 0801   BILIRUBINUR NEGATIVE 07/25/2021 0801   KETONESUR NEGATIVE 07/25/2021 0801   PROTEINUR NEGATIVE 07/25/2021 0801   UROBILINOGEN 0.2 04/18/2018 1150   NITRITE NEGATIVE 07/25/2021 0801   LEUKOCYTESUR NEGATIVE 07/25/2021 0801    Creatinine Clearance: Estimated Creatinine Clearance: 120.7 mL/min (by C-G formula based on SCr of 0.96 mg/dL).  Sepsis Labs: '@LABRCNTIP'$ (procalcitonin:4,lacticidven:4) )No results found for this or any previous visit (from the past 240 hour(s)).   Radiological Exams on Admission: No results found.    Assessment/Plan Principal Problem:   Abdominal pain #1 abdominal pain with nausea-patient with history of chronic biliary pancreatitis and sphincterotomy admitted with recurrent abdominal pain.  CT concerning for CBD dilatation and gallstones. MRCP ordered. GI consulted. N.p.o Zofran/IV fluids/Dilaudid for pain control. Ativan for claustrophobia as well as nausea if needed.(Since he has QT prolongation.)  #2 hypertension I will keep him on  hydralazine as needed and restart his home meds when he is able to take p.o. He is takes Norvasc, Lotensin, Toprol at home.  #3 hyperlipidemia on Crestor at home.  #4 hypokalemia replete potassium and check magnesium  Estimated body mass index is 28.37 kg/m as calculated from the following:   Height as of this encounter: '6\' 1"'$  (1.854 m).   Weight as of this encounter: 97.5 kg.   DVT prophylaxis: SCD Code Status: Full code Family Communication: None at bedside Disposition Plan: Pending improvement Consults called: GI Admission status: Observation  Georgette Shell MD  12/11/2021, 5:23 PM

## 2021-12-11 NOTE — ED Notes (Signed)
ED TO INPATIENT HANDOFF REPORT  ED Nurse Name and Phone #: Alfonse Flavors RN  S Name/Age/Gender [name] 46 y.o. male Room/Bed: MH12/MH12  Code Status   Code Status: Prior  Home/SNF/Other  Patient oriented to: self, place, time, and situation Is this baseline? Yes   Triage Complete: Triage complete  Chief Complaint Abdominal pain [R10.9]  Triage Note Seen recently for pancreatitis, finished course of abx at home. Continues to have abdominal pain, N/V. States MRCP scheduled for Friday and GI appt next week.   Allergies Allergies  Allergen Reactions   Cortisone Other (See Comments)    drops potassium level "bottoms out" potassium level drops potassium level Bottoms out potassium  "bottoms out" potassium level Other reaction(s): Other (See Comments) Bottoms out potassium   Eggs Or Egg-Derived Products Hives and Other (See Comments)    Rash  Rash   Ketorolac Hives and Other (See Comments)    Hives, slightly labored breathing  Hives, slightly labored breathing   Ketorolac Tromethamine Hives and Shortness Of Breath   Haldol [Haloperidol Lactate] Other (See Comments)    "jittery"    Haloperidol Other (See Comments)    "jittery" "jittery"    Hydrocortisone Hives    Also drops potassium level   Iodinated Contrast Media Nausea And Vomiting and Other (See Comments)    Hives Hives Hives Hives   Ketorolac Tromethamine Hives   Reglan [Metoclopramide]     Face "draws" and gets figgety   Compazine [Prochlorperazine] Other (See Comments)    Dystonic reaction   Droperidol Other (See Comments)    Makes face draw up    Level of Care/Admitting Diagnosis ED Disposition     ED Disposition  Admit   Condition  --   [place]: [facility] [[id number]]  Level of Care: Med-Surg [16]  Interfacility transfer: Yes  May place patient in observation at [facility] or [place] if equivalent level of care is available:: Yes  Covid  Evaluation: Recent COVID positive no isolation required infection day 21-90  Diagnosis: Abdominal pain [[id number]]  Admitting Physician: Reubin Milan [[id number]]  Attending Physician: Reubin Milan [[id number]]          B Medical/Surgery History Past Medical History:  Diagnosis Date   Chronic abdominal pain    Diverticulitis    Drug-seeking behavior    Hypertension    Kidney stones    Pancreatitis, chronic ([place]) 05/28/2005   Past Surgical History:  Procedure Laterality Date   APPENDECTOMY     CHOLECYSTECTOMY     ERCP N/A 12/30/2019   Procedure: ENDOSCOPIC RETROGRADE CHOLANGIOPANCREATOGRAPHY (ERCP);  Surgeon: Milus Banister, MD;  Location: [facility];  Service: Endoscopy;  Laterality: N/A;   ERCP N/A 03/04/2021   Procedure: ENDOSCOPIC RETROGRADE CHOLANGIOPANCREATOGRAPHY (ERCP);  Surgeon: Carol Ada, MD;  Location: [facility];  Service: Endoscopy;  Laterality: N/A;   ERCP W/ METAL STENT PLACEMENT     KIDNEY STONE SURGERY     REMOVAL OF STONES  12/30/2019   Procedure: REMOVAL OF STONES;  Surgeon: Milus Banister, MD;  Location: [facility];  Service: Endoscopy;;   REMOVAL OF STONES  03/04/2021   Procedure: REMOVAL OF STONES;  Surgeon: Carol Ada, MD;  Location: [facility];  Service: Endoscopy;;   SPHINCTEROTOMY  03/04/2021   Procedure: Joan Mayans;  Surgeon: Carol Ada, MD;  Location: [facility];  Service: Endoscopy;;     A IV Location/Drains/Wounds Patient Lines/Drains/Airways Status     Active Line/Drains/Airways  Name Placement date Placement time Site Days   Peripheral IV 12/11/21 20 G Anterior;Left;Upper Arm 12/11/21  1000  Arm  less than 1            Intake/Output Last 24 hours No intake or output data in the 24 hours ending 12/11/21 1531  Labs/Imaging Results for orders placed or performed during the hospital encounter of 12/11/21 (from the past 48 hour(s))  CBC with Differential     Status: Abnormal   Collection Time: 12/11/21  10:00 AM  Result Value Ref Range   WBC 8.8 4.0 - 10.5 K/uL   RBC 5.02 4.22 - 5.81 MIL/uL   Hemoglobin 15.5 13.0 - 17.0 g/dL   HCT 42.9 39.0 - 52.0 %   MCV 85.5 80.0 - 100.0 fL   MCH 30.9 26.0 - 34.0 pg   MCHC 36.1 (H) 30.0 - 36.0 g/dL   RDW 12.1 11.5 - 15.5 %   Platelets 236 150 - 400 K/uL   nRBC 0.0 0.0 - 0.2 %   Neutrophils Relative % 54 %   Neutro Abs 4.8 1.7 - 7.7 K/uL   Lymphocytes Relative 30 %   Lymphs Abs 2.6 0.7 - 4.0 K/uL   Monocytes Relative 11 %   Monocytes Absolute 1.0 0.1 - 1.0 K/uL   Eosinophils Relative 4 %   Eosinophils Absolute 0.3 0.0 - 0.5 K/uL   Basophils Relative 1 %   Basophils Absolute 0.1 0.0 - 0.1 K/uL   Immature Granulocytes 0 %   Abs Immature Granulocytes 0.03 0.00 - 0.07 K/uL    Comment: Performed at [facility], [address] [place], [place] [address]  Comprehensive metabolic panel     Status: Abnormal   Collection Time: 12/11/21 10:00 AM  Result Value Ref Range   Sodium 138 135 - 145 mmol/L   Potassium 3.4 (L) 3.5 - 5.1 mmol/L   Chloride 104 98 - 111 mmol/L   CO2 25 22 - 32 mmol/L   Glucose, Bld 108 (H) 70 - 99 mg/dL    Comment: Glucose reference range applies only to samples taken after fasting for at least 8 hours.   BUN 14 6 - 20 mg/dL   Creatinine, Ser 0.96 0.61 - 1.24 mg/dL   Calcium 9.8 8.9 - 10.3 mg/dL   Total Protein 7.7 6.5 - 8.1 g/dL   Albumin 5.0 3.5 - 5.0 g/dL   AST 18 15 - 41 U/L   ALT 20 0 - 44 U/L   Alkaline Phosphatase 83 38 - 126 U/L   Total Bilirubin 0.6 0.3 - 1.2 mg/dL   GFR, Estimated >60 >60 mL/min    Comment: (NOTE) Calculated using the CKD-EPI Creatinine Equation (2021)    Anion gap 9 5 - 15    Comment: Performed at [facility], [address], [place], [place] [address]  Lipase, blood     Status: None   Collection Time: 12/11/21 10:00 AM  Result Value Ref Range   Lipase 34 11 - 51 U/L    Comment: Performed at [facility], [address] [place], [place] [address]   No results found.  Pending Labs Unresulted Labs (From admission, onward)    None       Vitals/Pain Today's Vitals   12/11/21 1458 12/11/21 1500 12/11/21 1513 12/11/21 1515  BP:  (!) 144/92    Pulse:  61    Resp:  15  Temp:    97.7 F (36.5 C)  TempSrc:    Oral  SpO2:  93%    Weight:      Height:      PainSc: 6   6      Isolation Precautions No active isolations  Medications Medications  0.9 % NaCl with KCl 20 mEq/ L  infusion ( Intravenous New Bag/Given 12/11/21 1237)  HYDROmorphone (DILAUDID) injection 1 mg (1 mg Intravenous Given 12/11/21 1002)  metoCLOPramide (REGLAN) injection 10 mg (10 mg Intravenous Given 12/11/21 1002)  diphenhydrAMINE (BENADRYL) injection 50 mg (50 mg Intravenous Given 12/11/21 1002)  HYDROmorphone (DILAUDID) injection 1 mg (1 mg Intravenous Given 12/11/21 1158)  HYDROmorphone (DILAUDID) injection 1 mg (1 mg Intravenous Given 12/11/21 1435)    Mobility walks Low fall risk   Focused Assessments @   R Recommendations: See Admitting Provider Note  Report given to:   Additional Notes:

## 2021-12-11 NOTE — ED Triage Notes (Signed)
Seen recently for pancreatitis, finished course of abx at home. Continues to have abdominal pain, N/V. States MRCP scheduled for Friday and GI appt next week.

## 2021-12-11 NOTE — ED Provider Notes (Signed)
[facility] EMERGENCY DEPARTMENT Provider Note   CSN: [id number] Arrival date & time: 12/11/21  3329     History  Chief Complaint  Patient presents with   Abdominal Pain    [name] is a 46 y.o. male.  46 yo M with a chief complaints of recurrent upper abdominal discomfort.  The patient had recently been seen by his gastroenterologist and has plans for an MRCP in 4 days time.  He tells me that the pain has been unrelenting and he has been unable to really eat and drink well at home.  No fevers or chills.  Feels similar to the pain he had in the past.   Abdominal Pain      Home Medications Prior to Admission medications   Medication Sig Start Date End Date Taking? Authorizing Provider  acetaminophen (TYLENOL) 325 MG tablet Take 2 tablets (650 mg total) by mouth every 6 (six) hours as needed. 12/03/21   Tedd Sias, PA  amLODipine (NORVASC) 5 MG tablet TAKE 1 TABLET (5 MG TOTAL) BY MOUTH DAILY. 10/30/21   Martinique, Betty G, MD  amoxicillin-clavulanate (AUGMENTIN) 875-125 MG tablet Take 1 tablet by mouth 2 (two) times daily. 12/04/21   Irene Shipper, MD  benazepril (LOTENSIN) 40 MG tablet TAKE 1 TABLET BY MOUTH EVERY DAY 10/30/21   Martinique, Betty G, MD  fluticasone [facility]) 50 MCG/ACT nasal spray PLACE 1 SPRAY INTO BOTH NOSTRILS 2 (TWO) TIMES DAILY 10/30/21   Martin[place], Betty G, MD  KLOR-CON M20 20 MEQ tablet TAKE 1 TABLET BY MOUTH EVERY DAY 10/10/21   Martin[place], Betty G, MD  metoprolol succinate (TOPROL-XL) 100 MG 24 hr tablet TAKE 1 TABLET BY MOUTH EVERY DAY WITH OR IMMEDIATELY FOLLOWING A MEAL 10/30/21   Martin[place], Betty G, MD  ondansetron (ZOFRAN-ODT) 4 MG disintegrating tablet Take 1 tablet (4 mg total) by mouth every 8 (eight) hours as needed for nausea or vomiting. 12/03/21   Tedd Sias, PA  oxyCODONE (ROXICODONE) 5 MG immediate release tablet Take 1 tablet (5 mg total) by mouth every 4 (four) hours as needed for severe pain. 10/13/82   Delora Fuel, MD   oxyCODONE-acetaminophen (PERCOCET/ROXICET) 5-325 MG tablet Take 1 tablet by mouth every 6 (six) hours as needed for severe pain. 12/03/21   Tedd Sias, PA  promethazine (PHENERGAN) 25 MG suppository Place 1 suppository (25 mg total) rectally every 6 (six) hours as needed for nausea or vomiting. 1/66/06   Delora Fuel, MD  promethazine (PHENERGAN) 25 MG tablet Take 1 tablet (25 mg total) by mouth every 6 (six) hours as needed for nausea or vomiting. 07/12/21   Irene Shipper, MD  rosuvastatin (CRESTOR) 10 MG tablet TAKE 1 TABLET BY MOUTH EVERY DAY 09/18/21   Martinique, Betty G, MD      Allergies    Cortisone, Eggs or egg-derived products, Ketorolac, Ketorolac tromethamine, Haldol [haloperidol lactate], Haloperidol, Hydrocortisone, Iodinated contrast media, Ketorolac tromethamine, Reglan [metoclopramide], Compazine [prochlorperazine], and Droperidol    Review of Systems   Review of Systems  Gastrointestinal:  Positive for abdominal pain.    Physical Exam Updated Vital Signs BP (!) 126/95   Pulse 61   Temp 98.6 F (37 C)   Resp 17   Ht '6\' 1"'$  (1.854 m)   Wt 97.5 kg   SpO2 94%   BMI 28.37 kg/m  Physical Exam Vitals and nursing note reviewed.  Constitutional:      Appearance: He is well-developed.  HENT:  Head: Normocephalic and atraumatic.  Eyes:     Pupils: Pupils are equal, round, and reactive to light.  Neck:     Vascular: No JVD.  Cardiovascular:     Rate and Rhythm: Normal rate and regular rhythm.     Heart sounds: No murmur heard.    No friction rub. No gallop.  Pulmonary:     Effort: No respiratory distress.     Breath sounds: No wheezing.  Abdominal:     General: There is no distension.     Tenderness: There is abdominal tenderness (mild diffuse upper abdomen). There is no guarding or rebound.  Musculoskeletal:        General: Normal range of motion.     Cervical back: Normal range of motion and neck supple.  Skin:    Coloration: Skin is not pale.      Findings: No rash.  Neurological:     Mental Status: He is alert and oriented to person, place, and time.  Psychiatric:        Behavior: Behavior normal.     ED Results / Procedures / Treatments   Labs (all labs ordered are listed, but only abnormal results are displayed) Labs Reviewed  CBC WITH DIFFERENTIAL/PLATELET - Abnormal; Notable for the following components:      Result Value   MCHC 36.1 (*)    All other components within normal limits  COMPREHENSIVE METABOLIC PANEL - Abnormal; Notable for the following components:   Potassium 3.4 (*)    Glucose, Bld 108 (*)    All other components within normal limits  LIPASE, BLOOD    EKG None  Radiology No results found.  Procedures Procedures    Medications Ordered in ED Medications  0.9 % NaCl with KCl 20 mEq/ L  infusion ( Intravenous New Bag/Given 12/11/21 1237)  HYDROmorphone (DILAUDID) injection 1 mg (1 mg Intravenous Given 12/11/21 1002)  metoCLOPramide (REGLAN) injection 10 mg (10 mg Intravenous Given 12/11/21 1002)  diphenhydrAMINE (BENADRYL) injection 50 mg (50 mg Intravenous Given 12/11/21 1002)  HYDROmorphone (DILAUDID) injection 1 mg (1 mg Intravenous Given 12/11/21 1158)    ED Course/ Medical Decision Making/ A&P                           Medical Decision Making Amount and/or Complexity of Data Reviewed Labs: ordered. Radiology: ordered.  Risk Prescription drug management. Decision regarding hospitalization.   46 yo M with a significant past medical history of chronic pancreatitis comes in with a chief complaints of upper abdominal discomfort.  Patient had been recently seen in the emergency department on my record review and had CT imaging with biliary duct dilatation.  Plan by GI for MRCP in 4 days.  Patient with reported intractable pain came back to the ED a couple days ago had symptom control but is back today he said with unrelenting pain.  We will treat pain and nausea here.  Check blood work.  Will  discuss with GI.   I discussed case with [facility], Amy Esterwood.  Based on the history I provided in the patient's exam felt would be most reasonable to have them come into the hospital for MRCP and GI evaluation.  The patients results and plan were reviewed and discussed.   Any x-rays performed were independently reviewed by myself.   Differential diagnosis were considered with the presenting HPI.  Medications  0.9 % NaCl with KCl 20 mEq/ L  infusion ( Intravenous  New Bag/Given 12/11/21 1237)  HYDROmorphone (DILAUDID) injection 1 mg (1 mg Intravenous Given 12/11/21 1002)  metoCLOPramide (REGLAN) injection 10 mg (10 mg Intravenous Given 12/11/21 1002)  diphenhydrAMINE (BENADRYL) injection 50 mg (50 mg Intravenous Given 12/11/21 1002)  HYDROmorphone (DILAUDID) injection 1 mg (1 mg Intravenous Given 12/11/21 1158)    Vitals:   12/11/21 0946 12/11/21 1050 12/11/21 1057 12/11/21 1145  BP: (!) 159/110 (!) 137/92  (!) 126/95  Pulse: 78 69  61  Resp: '16 16  17  '$ Temp: 98.5 F (36.9 C)  98.6 F (37 C)   TempSrc: Oral     SpO2: 99% 96%  94%  Weight:      Height:        Final diagnoses:  Chronic biliary pancreatitis (HCC)    Admission/ observation were discussed with the admitting physician, patient and/or family and they are comfortable with the plan.         Final Clinical Impression(s) / ED Diagnoses Final diagnoses:  Chronic biliary pancreatitis [facility])    Rx / DC Orders ED Discharge Orders     None         Deno Etienne, DO 12/11/21 1409

## 2021-12-11 NOTE — Progress Notes (Signed)
Plan of Care Note for accepted transfer   Patient: [name] MRN: [id number]   [place]: 12/11/2021  Facility requesting transfer: [facility].  Requesting Provider: Deno Etienne, DO.  Reason for transfer:  Abdominal pain/chronic biliary pancreatitis. Facility course:  Per Dr. Tyrone Nine: "  Abdominal Pain      [name] is a 46 y.o. male.   46 yo M with a chief complaints of recurrent upper abdominal discomfort.  The patient had recently been seen by his gastroenterologist and has plans for an MRCP in 4 days time.  He tells me that the pain has been unrelenting and he has been unable to really eat and drink well at home.  No fevers or chills.  Feels similar to the pain he had in the past."  Dr. Tyrone Nine spoke to [facility] who would like to see him in the hospital, treat his symptoms and obtain already planned MRCP.  I started IV hydration with potassium supplementation.  Lab work:  Lipase, blood [id number]   Collected: 12/11/21 1000   Updated: 12/11/21 1129   Specimen Type: Blood   Specimen Source: Vein    Lipase 34 U/L  Comprehensive metabolic panel [[id number]] (Abnormal)   Collected: 12/11/21 1000   Updated: 12/11/21 1050   Specimen Type: Blood   Specimen Source: Vein    Sodium 138 mmol/L   Potassium 3.4 Low  mmol/L   Chloride 104 mmol/L   CO2 25 mmol/L   Glucose, Bld 108 High  mg/dL   BUN 14 mg/dL   Creatinine, Ser 0.96 mg/dL   Calcium 9.8 mg/dL   Total Protein 7.7 g/dL   Albumin 5.0 g/dL   AST 18 U/L   ALT 20 U/L   Alkaline Phosphatase 83 U/L   Total Bilirubin 0.6 mg/dL   GFR, Estimated >60 mL/min   Anion gap 9  CBC with Differential [[id number]] (Abnormal)   Collected: 12/11/21 1000   Updated: 12/11/21 1012   Specimen Type: Blood   Specimen Source: Vein    WBC 8.8 K/uL   RBC 5.02 MIL/uL   Hemoglobin 15.5 g/dL   HCT 42.9 %   MCV 85.5 fL   MCH 30.9 pg   MCHC 36.1 High  g/dL   RDW 12.1 %   Platelets 236 K/uL   nRBC 0.0 %   Neutrophils  Relative % 54 %   Neutro Abs 4.8 K/uL   Lymphocytes Relative 30 %   Lymphs Abs 2.6 K/uL   Monocytes Relative 11 %   Monocytes Absolute 1.0 K/uL   Eosinophils Relative 4 %   Eosinophils Absolute 0.3 K/uL   Basophils Relative 1 %   Basophils Absolute 0.1 K/uL   Immature Granulocytes 0 %   Abs Immature Granulocytes 0.03 K/uL    Plan of care: The patient is accepted for admission to [place]  unit, at [facility]..  Please notify [facility] of the patient's arrival to the facility.  Author: Reubin Milan, MD 12/11/2021  Check [email or web address] for on-call coverage.  Nursing staff, Please call [facility] number on Amion as soon as patient's arrival, so appropriate admitting provider can evaluate the pt.

## 2021-12-12 DIAGNOSIS — K8051 Calculus of bile duct without cholangitis or cholecystitis with obstruction: Secondary | ICD-10-CM | POA: Diagnosis present

## 2021-12-12 DIAGNOSIS — E785 Hyperlipidemia, unspecified: Secondary | ICD-10-CM | POA: Diagnosis present

## 2021-12-12 DIAGNOSIS — Z87442 Personal history of urinary calculi: Secondary | ICD-10-CM | POA: Diagnosis not present

## 2021-12-12 DIAGNOSIS — Z83438 Family history of other disorder of lipoprotein metabolism and other lipidemia: Secondary | ICD-10-CM | POA: Diagnosis not present

## 2021-12-12 DIAGNOSIS — Z8249 Family history of ischemic heart disease and other diseases of the circulatory system: Secondary | ICD-10-CM | POA: Diagnosis not present

## 2021-12-12 DIAGNOSIS — Z888 Allergy status to other drugs, medicaments and biological substances status: Secondary | ICD-10-CM | POA: Diagnosis not present

## 2021-12-12 DIAGNOSIS — R1011 Right upper quadrant pain: Secondary | ICD-10-CM | POA: Diagnosis not present

## 2021-12-12 DIAGNOSIS — R933 Abnormal findings on diagnostic imaging of other parts of digestive tract: Secondary | ICD-10-CM | POA: Diagnosis not present

## 2021-12-12 DIAGNOSIS — Z91012 Allergy to eggs: Secondary | ICD-10-CM | POA: Diagnosis not present

## 2021-12-12 DIAGNOSIS — Z91041 Radiographic dye allergy status: Secondary | ICD-10-CM | POA: Diagnosis not present

## 2021-12-12 DIAGNOSIS — F40240 Claustrophobia: Secondary | ICD-10-CM | POA: Diagnosis present

## 2021-12-12 DIAGNOSIS — R1010 Upper abdominal pain, unspecified: Secondary | ICD-10-CM | POA: Diagnosis not present

## 2021-12-12 DIAGNOSIS — R109 Unspecified abdominal pain: Secondary | ICD-10-CM | POA: Diagnosis present

## 2021-12-12 DIAGNOSIS — Z9049 Acquired absence of other specified parts of digestive tract: Secondary | ICD-10-CM | POA: Diagnosis not present

## 2021-12-12 DIAGNOSIS — E876 Hypokalemia: Secondary | ICD-10-CM | POA: Diagnosis present

## 2021-12-12 DIAGNOSIS — N289 Disorder of kidney and ureter, unspecified: Secondary | ICD-10-CM | POA: Diagnosis not present

## 2021-12-12 DIAGNOSIS — I10 Essential (primary) hypertension: Secondary | ICD-10-CM | POA: Diagnosis present

## 2021-12-12 DIAGNOSIS — Z886 Allergy status to analgesic agent status: Secondary | ICD-10-CM | POA: Diagnosis not present

## 2021-12-12 DIAGNOSIS — Z79899 Other long term (current) drug therapy: Secondary | ICD-10-CM | POA: Diagnosis not present

## 2021-12-12 DIAGNOSIS — K861 Other chronic pancreatitis: Secondary | ICD-10-CM | POA: Diagnosis present

## 2021-12-12 DIAGNOSIS — K7460 Unspecified cirrhosis of liver: Secondary | ICD-10-CM | POA: Diagnosis present

## 2021-12-12 DIAGNOSIS — F17210 Nicotine dependence, cigarettes, uncomplicated: Secondary | ICD-10-CM | POA: Diagnosis present

## 2021-12-12 DIAGNOSIS — R7989 Other specified abnormal findings of blood chemistry: Secondary | ICD-10-CM | POA: Diagnosis not present

## 2021-12-12 LAB — COMPREHENSIVE METABOLIC PANEL
ALT: 23 U/L (ref 0–44)
AST: 21 U/L (ref 15–41)
Albumin: 4 g/dL (ref 3.5–5.0)
Alkaline Phosphatase: 67 U/L (ref 38–126)
Anion gap: 7 (ref 5–15)
BUN: 10 mg/dL (ref 6–20)
CO2: 25 mmol/L (ref 22–32)
Calcium: 9.4 mg/dL (ref 8.9–10.3)
Chloride: 107 mmol/L (ref 98–111)
Creatinine, Ser: 0.86 mg/dL (ref 0.61–1.24)
GFR, Estimated: >60 mL/min (ref >60)
Glucose, Bld: 84 mg/dL (ref 70–99)
Potassium: 3.9 mmol/L (ref 3.5–5.1)
Sodium: 139 mmol/L (ref 135–145)
Total Bilirubin: 1 mg/dL (ref 0.3–1.2)
Total Protein: 7.1 g/dL (ref 6.5–8.1)

## 2021-12-12 LAB — MAGNESIUM: Magnesium: 2.2 mg/dL (ref 1.7–2.4)

## 2021-12-12 LAB — CBC
HCT: 43.5 % (ref 39.0–52.0)
Hemoglobin: 14.9 g/dL (ref 13.0–17.0)
MCH: 30.3 pg (ref 26.0–34.0)
MCHC: 34.3 g/dL (ref 30.0–36.0)
MCV: 88.4 fL (ref 80.0–100.0)
Platelets: 198 10*3/uL (ref 150–400)
RBC: 4.92 MIL/uL (ref 4.22–5.81)
RDW: 12.3 % (ref 11.5–15.5)
WBC: 11.8 10*3/uL — ABNORMAL HIGH (ref 4.0–10.5)
nRBC: 0 % (ref 0.0–0.2)

## 2021-12-12 LAB — PROTIME-INR
INR: 1 (ref 0.8–1.2)
Prothrombin Time: 13.1 seconds (ref 11.4–15.2)

## 2021-12-12 MED ORDER — AMLODIPINE BESYLATE 5 MG PO TABS
5.0000 mg | ORAL_TABLET | Freq: Every day | ORAL | Status: DC
  Administered 2021-12-12 – 2021-12-14 (×3): 5 mg via ORAL
  Filled 2021-12-12 (×3): qty 1

## 2021-12-12 MED ORDER — SODIUM CHLORIDE 0.9 % IV SOLN: INTRAVENOUS | Status: DC

## 2021-12-12 MED ORDER — BENAZEPRIL HCL 20 MG PO TABS
40.0000 mg | ORAL_TABLET | Freq: Every day | ORAL | Status: DC
  Administered 2021-12-12 – 2021-12-14 (×3): 40 mg via ORAL
  Filled 2021-12-12 (×3): qty 2

## 2021-12-12 NOTE — Consult Note (Addendum)
Consultation  Referring Provider:  [facility]/ Rodena Piety Primary Care Physician:  Martinique, Betty G, MD Primary Gastroenterologist:  Dr. Scarlette Shorts  Reason for Consultation: Acute on chronic abdominal pain, choledocholithiasis on MRCP  HPI: [name] is a 46 y.o. male, established with Dr. Henrene Pastor, with history of chronic abdominal pain, chronic narcotic use, history of chronic pancreatitis, prior history of drug-seeking behavior.  He is status post cholecystectomy, and has history of choledocholithiasis for which he underwent ERCP sphincterotomy and stone extraction October 2022 by Dr. Benson Norway. He was last seen by Dr. Henrene Pastor in February 2023 for follow-up.  He has also had prior EGD and colonoscopy January 2020 unrevealing with the exception of a diminutive adenoma.  Patient has had 5 ER visits since November 01, 2021 all with complaints of progressive severe abdominal pain despite narcotics.  With ER visit on 12/03/2021 he had undergone CT of the abdomen pelvis which showed pneumobilia consistent with prior sphincterotomy but interval increase in intra and extrahepatic biliary ductal dilation with CBD measuring 15 mm, there was question of stone versus debris in the distal common bile duct, also noted proximal pancreatic ductal dilation, no pancreatic inflammation.  Dr. Henrene Pastor had been notified and he had been started on Cipro 500 mg twice daily for 1 week, and was scheduled for MRCP which was to be done on 12/19/2021.  He has had normal LFTs with each of these ER visits.  He presented back to the emergency room on 12/08/2021 and again yesterday, stating that he had had worsening pain over the past week and a half and increase in nausea and vomiting which is now occurring on a daily basis.  No documented fever or chills, no complaints of diarrhea.  Patient states that he is supposed to be getting referred to pain management by his PCP but has not heard back from them at this time.  Labs yesterday again with  normal LFTs Normal CBC Lipase 34  Decision was made to admit him to the hospital, and pursue MRCP.  This was done last night and shows a normal-appearing liver, status postcholecystectomy, there is intra and extrahepatic ductal dilation with CBD of 13 mm, and filling defects measuring up to 9 mm in the distal common bile duct.  Pancreatic duct measures 6 mm, no pancreatic mass or pancreatic inflammatory changes noted.    Past Medical History:  Diagnosis Date   Chronic abdominal pain    Diverticulitis    Drug-seeking behavior    Hypertension    Kidney stones    Pancreatitis, chronic ([place]) 05/28/2005    Past Surgical History:  Procedure Laterality Date   APPENDECTOMY     CHOLECYSTECTOMY     ERCP N/A 12/30/2019   Procedure: ENDOSCOPIC RETROGRADE CHOLANGIOPANCREATOGRAPHY (ERCP);  Surgeon: Milus Banister, MD;  Location: [facility];  Service: Endoscopy;  Laterality: N/A;   ERCP N/A 03/04/2021   Procedure: ENDOSCOPIC RETROGRADE CHOLANGIOPANCREATOGRAPHY (ERCP);  Surgeon: Carol Ada, MD;  Location: [facility]OS[facility];  Service: Endoscopy;  Laterality: N/A;   ERCP W/ METAL STENT PLACEMENT     KIDNEY STONE SURGERY     REMOVAL OF STONES  12/30/2019   Procedure: REMOVAL OF STONES;  Surgeon: Milus Banister, MD;  Location: [facility] ENDOSCOPY;  Service: Endoscopy;;   REMOVAL OF STONES  03/04/2021   Procedure: REMOVAL OF STONES;  Surgeon: Carol Ada, MD;  Location: [facility] ENDOSCOPY;  Service: Endoscopy;;   SPHINCTEROTOMY  03/04/2021   Procedure: Joan Mayans;  Surgeon: Carol Ada, MD;  Location: [facility]  ENDOSCOPY;  Service: Endoscopy;;    Prior to Admission medications   Medication Sig Start Date End Date Taking? Authorizing Provider  acetaminophen (TYLENOL) 325 MG tablet Take 2 tablets (650 mg total) by mouth every 6 (six) hours as needed. Patient taking differently: Take 650 mg by mouth every 6 (six) hours as needed for moderate pain. 12/03/21  Yes [place], Wylder S, PA  amLODipine (NORVASC) 5 MG tablet  TAKE 1 TABLET (5 MG TOTAL) BY MOUTH DAILY. 10/30/21  Yes Martinique, Betty G, MD  benazepril (LOTENSIN) 40 MG tablet TAKE 1 TABLET BY MOUTH EVERY DAY 10/30/21  Yes Martinique, Betty G, MD  fluticasone (FLONASE) 50 MCG/ACT nasal spray PLACE 1 SPRAY INTO BOTH NOSTRILS 2 (TWO) TIMES DAILY Patient taking differently: Place 1 spray into both nostrils 2 (two) times daily as needed for allergies. 10/30/21  Yes Martinique, Betty G, MD  KLOR-CON M20 20 MEQ tablet TAKE 1 TABLET BY MOUTH EVERY DAY 10/10/21  Yes Martinique, Betty G, MD  metoprolol succinate (TOPROL-XL) 100 MG 24 hr tablet TAKE 1 TABLET BY MOUTH EVERY DAY WITH OR IMMEDIATELY FOLLOWING A MEAL 10/30/21  Yes Martinique, Betty G, MD  ondansetron (ZOFRAN-ODT) 4 MG disintegrating tablet Take 1 tablet (4 mg total) by mouth every 8 (eight) hours as needed for nausea or vomiting. 12/03/21  Yes [place], Wylder S, PA  rosuvastatin (CRESTOR) 10 MG tablet TAKE 1 TABLET BY MOUTH EVERY DAY 09/18/21  Yes Martinique, Betty G, MD  amoxicillin-clavulanate (AUGMENTIN) 875-125 MG tablet Take 1 tablet by mouth 2 (two) times daily. Patient not taking: Reported on 12/11/2021 12/04/21   Irene Shipper, MD  oxyCODONE (ROXICODONE) 5 MG immediate release tablet Take 1 tablet (5 mg total) by mouth every 4 (four) hours as needed for severe pain. Patient not taking: Reported on 3/33/5456 2/56/38   Delora Fuel, MD  oxyCODONE-acetaminophen (PERCOCET/ROXICET) 5-325 MG tablet Take 1 tablet by mouth every 6 (six) hours as needed for severe pain. Patient not taking: Reported on 12/11/2021 12/03/21   Tedd Sias, PA  promethazine (PHENERGAN) 25 MG suppository Place 1 suppository (25 mg total) rectally every 6 (six) hours as needed for nausea or vomiting. 9/37/34   Delora Fuel, MD  promethazine (PHENERGAN) 25 MG tablet Take 1 tablet (25 mg total) by mouth every 6 (six) hours as needed for nausea or vomiting. 07/12/21   Irene Shipper, MD    Current Facility-Administered Medications  Medication Dose Route Frequency  Provider Last Rate Last Admin   0.9 % NaCl with KCl 40 mEq / L  infusion   Intravenous Continuous Georgette Shell, MD 100 mL/hr at 12/12/21 0717 New Bag at 12/12/21 0717   HYDROmorphone (DILAUDID) injection 1 mg  1 mg Intravenous Q4H PRN Georgette Shell, MD   1 mg at 12/12/21 0817   LORazepam (ATIVAN) injection 0.5 mg  0.5 mg Intravenous Q4H PRN Kathryne Eriksson, NP   0.5 mg at 12/12/21 0817   ondansetron (ZOFRAN) injection 4 mg  4 mg Intravenous Q6H PRN Georgette Shell, MD       Oral care mouth rinse  15 mL Mouth Rinse PRN Georgette Shell, MD        Allergies as of 12/11/2021 - Review Complete 12/11/2021  Allergen Reaction Noted   Cortisone Other (See Comments) 04/03/2011   Eggs or egg-derived products Hives and Other (See Comments) 07/29/2011   Ketorolac Hives and Other (See Comments) 11/14/2011   Ketorolac tromethamine Hives and Shortness Of Breath 12/24/2011  Haldol [haloperidol lactate] Other (See Comments) 08/18/2014   Haloperidol Other (See Comments) 02/08/2015   Hydrocortisone Hives 12/24/2011   Iodinated contrast media Nausea And Vomiting and Other (See Comments) 12/10/2011   Ketorolac tromethamine Hives 04/03/2011   Reglan [metoclopramide]  06/13/2018   Compazine [prochlorperazine] Other (See Comments) 03/03/2021   Droperidol Other (See Comments) 06/15/2021    Family History  Problem Relation Age of Onset   Breast cancer Mother    Early death Mother    Hypertension Mother    Heart failure Father    Hypertension Father    Heart disease Father    Hyperlipidemia Father    Pancreatic cancer Paternal Grandmother        possibly   Pancreatic cancer Paternal Aunt    Colon cancer Neg Hx     Social History   Socioeconomic History   Marital status: Married    Spouse name: Not on file   Number of children: 1   Years of education: Not on file   Highest education level: Some college, no degree  Occupational History   Not on file  Tobacco Use    Smoking status: Every Day    Packs/day: 0.50    Types: Cigarettes   Smokeless tobacco: Never  Vaping Use   Vaping Use: Never used  Substance and Sexual Activity   Alcohol use: Not Currently   Drug use: No   Sexual activity: Not on file  Other Topics Concern   Not on file  Social History Narrative   Not on file   Social Determinants of Health   Financial Resource Strain: Low Risk  (04/03/2021)   Overall Financial Resource Strain (CARDIA)    Difficulty of Paying Living Expenses: Not hard at all  Food Insecurity: No Food Insecurity (04/03/2021)   Hunger Vital Sign    Worried About Running Out of Food in the Last Year: Never true    Ran Out of Food in the Last Year: Never true  Transportation Needs: No Transportation Needs (04/03/2021)   PRAPARE - Hydrologist (Medical): No    Lack of Transportation (Non-Medical): No  Physical Activity: Insufficiently Active (04/03/2021)   Exercise Vital Sign    Days of Exercise per Week: 1 day    Minutes of Exercise per Session: 20 min  Stress: No Stress Concern Present (04/03/2021)   [place]    Feeling of Stress : Only a little  Social Connections: Socially Integrated (04/03/2021)   Social Connection and Isolation Panel [NHANES]    Frequency of Communication with Friends and Family: Three times a week    Frequency of Social Gatherings with Friends and Family: Once a week    Attends Religious Services: More than 4 times per year    Active Member of [employer] or Organizations: Yes    Attends Music therapist: More than 4 times per year    Marital Status: Married  Human resources officer Violence: Not on file    Review of Systems: Pertinent positive and negative review of systems were noted in the above HPI section.  All other review of systems was otherwise negative.   Physical Exam: Vital signs in last 24 hours: Temp:  [97.7 F (36.5 C)-98.6  F (37 C)] 98.4 F (36.9 C) (07/18 0404) Pulse Rate:  [49-78] 56 (07/18 0404) Resp:  [15-18] 18 (07/18 0404) BP: (124-159)/(87-110) 144/98 (07/18 0404) SpO2:  [93 %-99 %] 96 % (07/18 0404) Weight:  [  97.5 kg] 97.5 kg (07/17 0945) Last BM Date : 12/10/21 General:   Alert,  Well-developed, well-nourished, white male pleasant and cooperative in NAD Head:  Normocephalic and atraumatic. Eyes:  Sclera clear, no icterus.   Conjunctiva pink. Ears:  Normal auditory acuity. Nose:  No deformity, discharge,  or lesions. Mouth:  No deformity or lesions.   Neck:  Supple; no masses or thyromegaly. Lungs:  Clear throughout to auscultation.   No wheezes, crackles, or rhonchi.  Heart:  Regular rate and rhythm; no murmurs, clicks, rubs,  or gallops. Abdomen:  Soft, BS active, he is tender across the upper abdomen, no rebound,nonpalp mass or HSM.   Rectal: Not done Msk:  Symmetrical without gross deformities. . Pulses:  Normal pulses noted. Extremities:  Without clubbing or edema. Neurologic:  Alert and  oriented x4;  grossly normal neurologically. Skin:  Intact without significant lesions or rashes.. Psych:  Alert and cooperative. Normal mood and affect.  Intake/Output from previous day: 07/17 0701 - 07/18 0700 In: 1999.9 [I.V.:1999.9] Out: -  Intake/Output this shift: No intake/output data recorded.  Lab Results: Recent Labs    12/11/21 1000 12/12/21 0549  WBC 8.8 11.8*  HGB 15.5 14.9  HCT 42.9 43.5  PLT 236 198   BMET Recent Labs    12/11/21 1000 12/12/21 0549  NA 138 139  K 3.4* 3.9  CL 104 107  CO2 25 25  GLUCOSE 108* 84  BUN 14 10  CREATININE 0.96 0.86  CALCIUM 9.8 9.4   LFT Recent Labs    12/12/21 0549  PROT 7.1  ALBUMIN 4.0  AST 21  ALT 23  ALKPHOS 73  BILITOT 1.0    IMPRESSION:   #34 46 year old white male with chronic abdominal pain, chronic pancreatitis, history of choledocholithiasis, status post ERCP and stone extraction October 2022, and prior  cholecystectomy and now with worsening of abdominal pain over the past month with multiple ER visits.  He has not had any evidence of acute pancreatitis, last CT on 12/03/2021 showed progressive intra and extrahepatic ductal dilation and possible debris in the distal common bile duct, also noted to have pancreatic ductal dilation.  LFTs have been entirely normal  MRCP yesterday confirms distal CBD stones, one measuring at least 9 mm, and CBD of 13 mm, pancreatic duct dilated to 6 mm in the head of the pancreas near the ampulla no pancreatic mass or inflammatory changes identified.  #2 chronic narcotic use/chronic abdominal pain  #3 hypertension   PLAN: Full liquid diet today, n.p.o. after midnight Patient has been scheduled for ERCP, probable extension of sphincterotomy and stone extraction with Dr. Fuller Plan tomorrow 12/13/2021 at 1 PM.  Procedure has been discussed in detail with the patient including indications risks and benefits and he is agreeable to proceed. IV Unasyn preprocedure, patient allergic to NSAIDs and therefore no indomethacin suppository to be given postprocedure GI will follow with you  Amy EsterwoodPA-C  12/12/2021, 8:54 AM    Attending Physician Note   I have taken a history, reviewed the chart and examined the patient. I performed a substantive portion of this encounter, including complete performance of at least one of the key components, in conjunction with the APP. I agree with the APP's note, impression and recommendations with my edits. My additional impressions and recommendations are as follows.   Chronic abdominal pain, chronic pancreatitis. Recent increase in upper abdominal pain with MRCP yesterday showing distal CBD stones, one measuring at least 9 mm, CBD dilation to 13 mm,  pancreatic duct dilated to 6 mm in the head of the pancreas near the ampulla. No pancreatic mass or inflammatory changes identified. LFTs are normal. ERCP with possible sphincterotomy  extension, stone extraction tomorrow. No NSAIDs at ERCP d/t allergy. Outpatient GI follow up with Dr. Henrene Pastor.   Lucio Edward, MD [facility] See AMION, [facility], for our on call provider

## 2021-12-12 NOTE — Progress Notes (Addendum)
PROGRESS NOTE    [name]  JGO:[id number] DOB: [birth year] DOA: 12/11/2021 PCP: Martinique, Betty G, MD   Brief Narrative:  [name] is a 46 y.o. male with medical history significant of chronic biliary pancreatitis, cholangitis, history of ERCP and stone extraction in October 2022, cholecystectomy cirrhosis, hypertension admitted with severe abdominal upper abdominal pain for the past 1 week associated with nausea and vomiting.  Denies diarrhea.  Patient has seen [facility] as an outpatient and was planning to have MRCP done. Patient had appointment to see Dr. Henrene Pastor at the office on July 25. He denies fever chills dysuria chest pain shortness of breath or cough. No complaints of GERD.  No history of smoking alcohol use or illegal drug use. He had 5 ER visits since November 01, 2021. CT abdomen 12/03/2021 pneumobilia felt related to prior history of sphincterotomy interval increase in intra and extrahepatic biliary dilatation with common bile duct dilatation up to 15 mm, raising concern for acute obstruction.  Possible debris's or noncalcified stone in the distal common bile duct.  Mild dilatation of the proximal pancreatic duct.  MRCP 12/11/2021 status postcholecystectomy, intrahepatic and extrahepatic ductal dilatation with CBD of 13 mm and filling defects measuring up to 9 mm in the distal common bile duct.  Pancreatic duct measures 6 mm.  No pancreatic mass or inflammation.  Normal appearing liver.   ED Course: He received Dilaudid, Zofran, IV fluids.  Assessment & Plan:   Principal Problem:   Abdominal pain Active Problems:   Intractable nausea and vomiting   #1 abdominal pain with nausea-patient with history of chronic biliary pancreatitis and sphincterotomy admitted with recurrent abdominal pain.   CT concerning for CBD dilatation and gallstones. MRCP 12/11/2021 with distal CBD stones and CBD dilatation and pancreatic duct dilatation.  Patient scheduled to have ERCP in a.m.   N.p.o. after midnight.  Clear liquids for today.   Zofran/IV fluids/Dilaudid for pain control. Ativan for claustrophobia as well as nausea if needed.(Since he has QT prolongation.) Normal LFTs  #2 hypertension restart Norvasc and Lotensin. Toprol at home may restart prn  #3 hyperlipidemia on Crestor at home.   #4 hypokalemia-resolved mag 2.2     Estimated body mass index is 28.37 kg/m as calculated from the following:   Height as of this encounter: '6\' 1"'$  (1.854 m).   Weight as of this encounter: 97.5 kg.  DVT prophylaxis: None for ERCP tomorrow  code Status: Full code  family Communication: None at bedside  disposition Plan:  Status is: Inpatient Remains inpatient appropriate because: Abdominal pain with CBD dilatation for ERCP tomorrow   Consultants:  GI  Procedures: MRCP 12/11/2021  antimicrobials: None Subjective: Continues to complain of upper abdominal pain and nausea Has been n.p.o.  Objective: Vitals:   12/11/21 1850 12/11/21 2006 12/12/21 0055 12/12/21 0404  BP: (!) 146/99 (!) 124/95 139/87 (!) 144/98  Pulse: 64 (!) 54 (!) 49 (!) 56  Resp: '17 16 16 18  '$ Temp: 98.4 F (36.9 C) 98.4 F (36.9 C) 98.3 F (36.8 C) 98.4 F (36.9 C)  TempSrc: Oral Oral Oral Oral  SpO2: 95% 94% 93% 96%  Weight:      Height:        Intake/Output Summary (Last 24 hours) at 12/12/2021 1421 Last data filed at 12/11/2021 2019 Gross per 24 hour  Intake 1999.89 ml  Output --  Net 1999.89 ml   Filed Weights   12/11/21 0945  Weight: 97.5 kg    Examination:  General exam: Appears  in nad  Respiratory system: Clear to auscultation. Respiratory effort normal. Cardiovascular system: S1 & S2 heard, RRR. No JVD, murmurs, rubs, gallops or clicks. No pedal edema. Gastrointestinal system: Abdomen is nondistended, soft and tender. No organomegaly or masses felt. Normal bowel sounds heard. Central nervous system: Alert and oriented. No focal neurological deficits. Extremities: Symmetric 5  x 5 power. Skin: No rashes, lesions or ulcers Psychiatry: Judgement and insight appear normal. Mood & affect appropriate.     Data Reviewed: I have personally reviewed following labs and imaging studies  CBC: Recent Labs  Lab 12/07/21 2312 12/11/21 1000 12/12/21 0549  WBC 9.9 8.8 11.8*  NEUTROABS  --  4.8  --   HGB 16.1 15.5 14.9  HCT 45.6 42.9 43.5  MCV 85.6 85.5 88.4  PLT 241 236 128   Basic Metabolic Panel: Recent Labs  Lab 12/07/21 2312 12/11/21 1000 12/12/21 0549  NA 136 138 139  K 3.5 3.4* 3.9  CL 105 104 107  CO2 20* 25 25  GLUCOSE 95 108* 84  BUN '11 14 10  '$ CREATININE 0.92 0.96 0.86  CALCIUM 9.8 9.8 9.4  MG  --   --  2.2   GFR: Estimated Creatinine Clearance: 134.7 mL/min (by C-G formula based on SCr of 0.86 mg/dL). Liver Function Tests: Recent Labs  Lab 12/07/21 2312 12/11/21 1000 12/12/21 0549  AST '18 18 21  '$ ALT '23 20 23  '$ ALKPHOS 70 83 67  BILITOT 0.8 0.6 1.0  PROT 8.0 7.7 7.1  ALBUMIN 4.7 5.0 4.0   Recent Labs  Lab 12/07/21 2312 12/11/21 1000  LIPASE 53* 34   No results for input(s): "AMMONIA" in the last 168 hours. Coagulation Profile: Recent Labs  Lab 12/12/21 0919  INR 1.0   Cardiac Enzymes: No results for input(s): "CKTOTAL", "CKMB", "CKMBINDEX", "TROPONINI" in the last 168 hours. BNP (last 3 results) No results for input(s): "PROBNP" in the last 8760 hours. HbA1C: No results for input(s): "HGBA1C" in the last 72 hours. CBG: No results for input(s): "GLUCAP" in the last 168 hours. Lipid Profile: No results for input(s): "CHOL", "HDL", "LDLCALC", "TRIG", "CHOLHDL", "LDLDIRECT" in the last 72 hours. Thyroid Function Tests: No results for input(s): "TSH", "T4TOTAL", "FREET4", "T3FREE", "THYROIDAB" in the last 72 hours. Anemia Panel: No results for input(s): "VITAMINB12", "FOLATE", "FERRITIN", "TIBC", "IRON", "RETICCTPCT" in the last 72 hours. Sepsis Labs: No results for input(s): "PROCALCITON", "LATICACIDVEN" in the last 168  hours.  No results found for this or any previous visit (from the past 240 hour(s)).       Radiology Studies: MR ABDOMEN MRCP W WO CONTAST  Result Date: 12/11/2021 CLINICAL DATA:  Biliary obstruction suspected EXAM: MRI ABDOMEN WITHOUT AND WITH CONTRAST (INCLUDING MRCP) TECHNIQUE: Multiplanar multisequence MR imaging of the abdomen was performed both before and after the administration of intravenous contrast. Heavily T2-weighted images of the biliary and pancreatic ducts were obtained, and three-dimensional MRCP images were rendered by post processing. CONTRAST:  87m GADAVIST GADOBUTROL 1 MMOL/ML IV SOLN COMPARISON:  CT abdomen and pelvis 12/03/2021 FINDINGS: Lower chest: No acute findings. Hepatobiliary: Liver is normal in size and contour with no suspicious mass identified. There is evidence of hepatic steatosis. Gallbladder is surgically absent. Moderate intra and extrahepatic biliary ductal dilatation with the common bile duct measuring up to 13 mm in diameter. There are filling defects visualized in the distal common bile duct measuring up to 9 mm in size consistent with choledocholithiasis and debris. Pancreas: Mild dilatation of  the pancreatic duct measuring up to 6 mm in diameter in the head of the pancreas near the ampulla. No pancreatic mass or inflammatory changes identified. Spleen:  Within normal limits in size and appearance. Adrenals/Urinary Tract: No masses identified. No evidence of hydronephrosis. Stomach/Bowel: Visualized portions within the abdomen are unremarkable. Vascular/Lymphatic: No pathologically enlarged lymph nodes identified. No abdominal aortic aneurysm demonstrated. Other:  No ascites. Musculoskeletal: No suspicious bone lesions identified. IMPRESSION: 1. Choledocholithiasis with moderate intra and extrahepatic biliary ductal dilatation. 2. Hepatic steatosis. 3. Mild dilatation of the pancreatic duct. Electronically Signed   By: Ofilia Neas M.D.   On: 12/11/2021  19:12   MR 3D Recon At Scanner  Result Date: 12/11/2021 CLINICAL DATA:  Biliary obstruction suspected EXAM: MRI ABDOMEN WITHOUT AND WITH CONTRAST (INCLUDING MRCP) TECHNIQUE: Multiplanar multisequence MR imaging of the abdomen was performed both before and after the administration of intravenous contrast. Heavily T2-weighted images of the biliary and pancreatic ducts were obtained, and three-dimensional MRCP images were rendered by post processing. CONTRAST:  22m GADAVIST GADOBUTROL 1 MMOL/ML IV SOLN COMPARISON:  CT abdomen and pelvis 12/03/2021 FINDINGS: Lower chest: No acute findings. Hepatobiliary: Liver is normal in size and contour with no suspicious mass identified. There is evidence of hepatic steatosis. Gallbladder is surgically absent. Moderate intra and extrahepatic biliary ductal dilatation with the common bile duct measuring up to 13 mm in diameter. There are filling defects visualized in the distal common bile duct measuring up to 9 mm in size consistent with choledocholithiasis and debris. Pancreas: Mild dilatation of the pancreatic duct measuring up to 6 mm in diameter in the head of the pancreas near the ampulla. No pancreatic mass or inflammatory changes identified. Spleen:  Within normal limits in size and appearance. Adrenals/Urinary Tract: No masses identified. No evidence of hydronephrosis. Stomach/Bowel: Visualized portions within the abdomen are unremarkable. Vascular/Lymphatic: No pathologically enlarged lymph nodes identified. No abdominal aortic aneurysm demonstrated. Other:  No ascites. Musculoskeletal: No suspicious bone lesions identified. IMPRESSION: 1. Choledocholithiasis with moderate intra and extrahepatic biliary ductal dilatation. 2. Hepatic steatosis. 3. Mild dilatation of the pancreatic duct. Electronically Signed   By: DOfilia NeasM.D.   On: 12/11/2021 19:12        Scheduled Meds:  Continuous Infusions:  0.9 % NaCl with KCl 40 mEq / L 100 mL/hr at 12/12/21  0717     LOS: 0 days    Time spent: 30 min   EGeorgette Shell MD 12/12/2021, 2:21 PM

## 2021-12-13 ENCOUNTER — Encounter (HOSPITAL_COMMUNITY): Payer: Self-pay | Admitting: Internal Medicine

## 2021-12-13 ENCOUNTER — Inpatient Hospital Stay (HOSPITAL_COMMUNITY): Payer: No Typology Code available for payment source | Admitting: Anesthesiology

## 2021-12-13 ENCOUNTER — Inpatient Hospital Stay (HOSPITAL_COMMUNITY): Payer: No Typology Code available for payment source

## 2021-12-13 ENCOUNTER — Encounter (HOSPITAL_COMMUNITY)
Admission: EM | Disposition: A | Discharge status: Home / Self Care | Payer: Self-pay | Source: Home / Self Care | Attending: Internal Medicine

## 2021-12-13 DIAGNOSIS — F17210 Nicotine dependence, cigarettes, uncomplicated: Secondary | ICD-10-CM

## 2021-12-13 DIAGNOSIS — K861 Other chronic pancreatitis: Secondary | ICD-10-CM | POA: Diagnosis not present

## 2021-12-13 DIAGNOSIS — K8051 Calculus of bile duct without cholangitis or cholecystitis with obstruction: Principal | ICD-10-CM

## 2021-12-13 DIAGNOSIS — Z9049 Acquired absence of other specified parts of digestive tract: Secondary | ICD-10-CM

## 2021-12-13 DIAGNOSIS — N289 Disorder of kidney and ureter, unspecified: Secondary | ICD-10-CM

## 2021-12-13 DIAGNOSIS — I10 Essential (primary) hypertension: Secondary | ICD-10-CM

## 2021-12-13 HISTORY — PX: ERCP: SHX5425

## 2021-12-13 HISTORY — PX: REMOVAL OF STONES: SHX5545

## 2021-12-13 LAB — COMPREHENSIVE METABOLIC PANEL
ALT: 19 U/L (ref 0–44)
AST: 19 U/L (ref 15–41)
Albumin: 3.9 g/dL (ref 3.5–5.0)
Alkaline Phosphatase: 68 U/L (ref 38–126)
Anion gap: 7 (ref 5–15)
BUN: 8 mg/dL (ref 6–20)
CO2: 26 mmol/L (ref 22–32)
Calcium: 8.9 mg/dL (ref 8.9–10.3)
Chloride: 101 mmol/L (ref 98–111)
Creatinine, Ser: 0.74 mg/dL (ref 0.61–1.24)
GFR, Estimated: >60 mL/min (ref >60)
Glucose, Bld: 92 mg/dL (ref 70–99)
Potassium: 3.6 mmol/L (ref 3.5–5.1)
Sodium: 134 mmol/L — ABNORMAL LOW (ref 135–145)
Total Bilirubin: 0.7 mg/dL (ref 0.3–1.2)
Total Protein: 6.9 g/dL (ref 6.5–8.1)

## 2021-12-13 LAB — CBC
HCT: 42.1 % (ref 39.0–52.0)
Hemoglobin: 14.6 g/dL (ref 13.0–17.0)
MCH: 30.2 pg (ref 26.0–34.0)
MCHC: 34.7 g/dL (ref 30.0–36.0)
MCV: 87.2 fL (ref 80.0–100.0)
Platelets: 189 10*3/uL (ref 150–400)
RBC: 4.83 MIL/uL (ref 4.22–5.81)
RDW: 12 % (ref 11.5–15.5)
WBC: 11 10*3/uL — ABNORMAL HIGH (ref 4.0–10.5)
nRBC: 0 % (ref 0.0–0.2)

## 2021-12-13 SURGERY — ERCP, WITH INTERVENTION IF INDICATED
Anesthesia: General

## 2021-12-13 MED ORDER — HYDROMORPHONE HCL 1 MG/ML IJ SOLN
1.0000 mg | INTRAMUSCULAR | Status: DC | PRN
  Administered 2021-12-13 – 2021-12-14 (×7): 1 mg via INTRAVENOUS
  Filled 2021-12-13 (×7): qty 1

## 2021-12-13 MED ORDER — CIPROFLOXACIN IN D5W 400 MG/200ML IV SOLN
INTRAVENOUS | Status: DC | PRN
  Administered 2021-12-13: 400 mg via INTRAVENOUS

## 2021-12-13 MED ORDER — CIPROFLOXACIN IN D5W 400 MG/200ML IV SOLN
INTRAVENOUS | Status: AC
  Filled 2021-12-13: qty 200

## 2021-12-13 MED ORDER — PROPOFOL 10 MG/ML IV BOLUS
INTRAVENOUS | Status: DC | PRN
  Administered 2021-12-13: 200 mg via INTRAVENOUS

## 2021-12-13 MED ORDER — LABETALOL HCL 5 MG/ML IV SOLN
INTRAVENOUS | Status: DC | PRN
  Administered 2021-12-13 (×2): 5 mg via INTRAVENOUS

## 2021-12-13 MED ORDER — SODIUM CHLORIDE 0.9 % IV SOLN
INTRAVENOUS | Status: DC | PRN
  Administered 2021-12-13: 20 mL

## 2021-12-13 MED ORDER — LIDOCAINE HCL (CARDIAC) PF 100 MG/5ML IV SOSY
PREFILLED_SYRINGE | INTRAVENOUS | Status: DC | PRN
  Administered 2021-12-13: 80 mg via INTRAVENOUS

## 2021-12-13 MED ORDER — LACTATED RINGERS IV SOLN: INTRAVENOUS | Status: DC | PRN

## 2021-12-13 MED ORDER — GLUCAGON HCL RDNA (DIAGNOSTIC) 1 MG IJ SOLR
INTRAMUSCULAR | Status: DC | PRN
  Administered 2021-12-13: 1 mg via INTRAVENOUS

## 2021-12-13 MED ORDER — GLUCAGON HCL RDNA (DIAGNOSTIC) 1 MG IJ SOLR
INTRAMUSCULAR | Status: AC
  Filled 2021-12-13: qty 1

## 2021-12-13 MED ORDER — SUGAMMADEX SODIUM 200 MG/2ML IV SOLN
INTRAVENOUS | Status: DC | PRN
  Administered 2021-12-13: 200 mg via INTRAVENOUS

## 2021-12-13 MED ORDER — ROCURONIUM BROMIDE 100 MG/10ML IV SOLN
INTRAVENOUS | Status: DC | PRN
  Administered 2021-12-13: 40 mg via INTRAVENOUS

## 2021-12-13 NOTE — Progress Notes (Signed)
  Transition of Care [facility]) Screening Note   Patient Details  Name: [name] Date of Birth: 1976/05/19   Transition of Care [facility]) CM/SW Contact:    Vassie Moselle, LCSW Phone Number: 12/13/2021, 9:23 AM    Transition of Care Department [facility]) has reviewed patient and no TOC needs have been identified at this time. We will continue to monitor patient advancement through interdisciplinary progression rounds. If new patient transition needs arise, please place a TOC consult.

## 2021-12-13 NOTE — Progress Notes (Signed)
PROGRESS NOTE  [name] ZJQ:[id number] DOB: Dec 29, 1975 DOA: 12/11/2021 PCP: Martinique, Betty G, MD   LOS: 1 day   Brief Narrative / Interim history: 46 y.o. male with medical history significant of chronic biliary pancreatitis, cholangitis, history of ERCP and stone extraction in October 2022, cholecystectomy cirrhosis, hypertension admitted with severe abdominal upper abdominal pain for the past 1 week associated with nausea and vomiting.  Denies diarrhea.  Patient has seen [facility] as an outpatient and was planning to have MRCP done.  An MRI done during this admission showed CBD of 13 mm and filling defect measuring up to 9 mm in the distal CBD.  GI consulted, will have ERCP this afternoon  Subjective / 24h Interval events: Complains of persistent abdominal pain this morning.  Awaiting ERCP  Assesement and Plan: Principal Problem:   Abdominal pain Active Problems:   Intractable nausea and vomiting   Principal problem Abdominal pain with nausea due to choledocholithiasis-patient with history of chronic biliary pancreatitis and sphincterotomy admitted with recurrent abdominal pain.  Imaging on admission concerning for CBD dilatation as well as choledocholithiasis.  GI consulted, will undergo ERCP this afternoon.  Active problems HTN-on amlodipine, benazepril  Hyperlipidemia-on Crestor at home  Hypokalemia-replete as indicated and continue monitoring   Scheduled Meds:  [MAR Hold] amLODipine  5 mg Oral Daily   [MAR Hold] benazepril  40 mg Oral Daily   Continuous Infusions:  sodium chloride 75 mL/hr at 12/13/21 0723   PRN Meds:.[MAR Hold]  HYDROmorphone (DILAUDID) injection, [MAR Hold] LORazepam, [MAR Hold] ondansetron (ZOFRAN) IV, [MAR Hold] mouth rinse  Diet Orders (From admission, onward)     Start     Ordered   12/13/21 0001  Diet NPO time specified  Diet effective midnight        12/12/21 0832            DVT prophylaxis: SCDs Start: 12/11/21 1725   Lab  Results  Component Value Date   PLT 189 12/13/2021      Code Status: Full Code  Family Communication: No family at bedside  Status is: Inpatient  Remains inpatient appropriate because: ERCP this afternoon  Level of care: Med-Surg  Consultants:  GI  Objective: Vitals:   12/12/21 2003 12/13/21 0305 12/13/21 0830 12/13/21 1222  BP: (!) 147/96 (!) 125/92 (!) 133/103 (!) 151/103  Pulse: 71 77 93 99  Resp: '16 16  16  '$ Temp: 98.8 F (37.1 C) 98.3 F (36.8 C)  98.6 F (37 C)  TempSrc: Oral Oral  Temporal  SpO2: 98% 98%  95%  Weight:      Height:        Intake/Output Summary (Last 24 hours) at 12/13/2021 1306 Last data filed at 12/13/2021 1014 Gross per 24 hour  Intake 3280.12 ml  Output 1400 ml  Net 1880.12 ml   Wt Readings from Last 3 Encounters:  12/11/21 97.5 kg  12/03/21 98.6 kg  11/14/21 98.6 kg    Examination:  Constitutional: NAD Eyes: no scleral icterus ENMT: Mucous membranes are moist.  Neck: normal, supple Respiratory: clear to auscultation bilaterally, no wheezing, no crackles. Normal respiratory effort. No accessory muscle use.  Cardiovascular: Regular rate and rhythm, no murmurs / rubs / gallops. No LE edema.  Abdomen: non distended, no tenderness. Bowel sounds positive.  Musculoskeletal: no clubbing / cyanosis.  Skin: no rashes Neurologic: non focal    Data Reviewed: I have independently reviewed following labs and imaging studies  CBC Recent Labs  Lab 12/07/21 2312  12/11/21 1000 12/12/21 0549 12/13/21 0551  WBC 9.9 8.8 11.8* 11.0*  HGB 16.1 15.5 14.9 14.6  HCT 45.6 42.9 43.5 42.1  PLT 241 236 198 189  MCV 85.6 85.5 88.4 87.2  MCH 30.2 30.9 30.3 30.2  MCHC 35.3 36.1* 34.3 34.7  RDW 12.1 12.1 12.3 12.0  LYMPHSABS  --  2.6  --   --   MONOABS  --  1.0  --   --   EOSABS  --  0.3  --   --   BASOSABS  --  0.1  --   --     Recent Labs  Lab 12/07/21 2312 12/11/21 1000 12/12/21 0549 12/12/21 0919 12/13/21 0551  NA 136 138 139  --   134*  K 3.5 3.4* 3.9  --  3.6  CL 105 104 107  --  101  CO2 20* 25 25  --  26  GLUCOSE 95 108* 84  --  92  BUN '11 14 10  '$ --  8  CREATININE 0.92 0.96 0.86  --  0.74  CALCIUM 9.8 9.8 9.4  --  8.9  AST '18 18 21  '$ --  19  ALT '23 20 23  '$ --  19  ALKPHOS 70 83 67  --  68  BILITOT 0.8 0.6 1.0  --  0.7  ALBUMIN 4.7 5.0 4.0  --  3.9  MG  --   --  2.2  --   --   INR  --   --   --  1.0  --     ------------------------------------------------------------------------------------------------------------------ No results for input(s): "CHOL", "HDL", "LDLCALC", "TRIG", "CHOLHDL", "LDLDIRECT" in the last 72 hours.  No results found for: "HGBA1C" ------------------------------------------------------------------------------------------------------------------ No results for input(s): "TSH", "T4TOTAL", "T3FREE", "THYROIDAB" in the last 72 hours.  Invalid input(s): "FREET3"  Cardiac Enzymes No results for input(s): "CKMB", "TROPONINI", "MYOGLOBIN" in the last 168 hours.  Invalid input(s): "CK" ------------------------------------------------------------------------------------------------------------------ No results found for: "BNP"  CBG: No results for input(s): "GLUCAP" in the last 168 hours.  No results found for this or any previous visit (from the past 240 hour(s)).   Radiology Studies: No results found.   Marzetta Board, MD, PhD [facility]  Between 7 am - 7 pm I am available, please contact me via Amion (for emergencies) or Securechat (non urgent messages)  Between 7 pm - 7 am I am not available, please contact night coverage MD/APP via Amion

## 2021-12-13 NOTE — Interval H&P Note (Signed)
History and Physical Interval Note:  12/13/2021 1:01 PM  [name]  has presented today for surgery, with the diagnosis of CBD stones.  The various methods of treatment have been discussed with the patient and family. After consideration of risks, benefits and other options for treatment, the patient has consented to  Procedure(s): ENDOSCOPIC RETROGRADE CHOLANGIOPANCREATOGRAPHY (ERCP) (N/A) as a surgical intervention.  The patient's history has been reviewed, patient examined, no change in status, stable for surgery.  I have reviewed the patient's chart and labs.  Questions were answered to the patient's satisfaction.     Pricilla Riffle. Fuller Plan

## 2021-12-13 NOTE — H&P (View-Only) (Signed)
Patient ID: [name], male   DOB: 08-05-1975, 46 y.o.   MRN: [id number]    Progress Note   Subjective  Day #2 CC: Acute on chronic abdominal pain, choledocholithiasis  WBC 11.0/hemoglobin 14.6/platelets 189 Potassium 3.6 LFTs remain normal  Patient says his pain is better controlled since being in the hospital-more comfortable today says he still had a rough night No nausea or vomiting Remains afebrile    Objective   Vital signs in last 24 hours: Temp:  [98.3 F (36.8 C)-98.8 F (37.1 C)] 98.3 F (36.8 C) (07/19 0305) Pulse Rate:  [71-93] 93 (07/19 0830) Resp:  [16] 16 (07/19 0305) BP: (125-147)/(92-103) 133/103 (07/19 0830) SpO2:  [98 %] 98 % (07/19 0305) Last BM Date : 12/10/21 General:    White male in NAD Heart:  Regular rate and rhythm; no murmurs Lungs: Respirations even and unlabored, lungs CTA bilaterally Abdomen:  Soft, nondistended.  Mild tenderness across the upper abdomen/epigastric ,normal bowel sounds. Extremities:  Without edema. Neurologic:  Alert and oriented,  grossly normal neurologically. Psych:  Cooperative. Normal mood and affect.  Intake/Output from previous day: 07/18 0701 - 07/19 0700 In: 3280.1 [P.O.:590; I.V.:2690.1] Out: 900 [Urine:900] Intake/Output this shift: Total I/O In: -  Out: 350 [Urine:350]  Lab Results: Recent Labs    12/11/21 1000 12/12/21 0549 12/13/21 0551  WBC 8.8 11.8* 11.0*  HGB 15.5 14.9 14.6  HCT 42.9 43.5 42.1  PLT 236 198 189   BMET Recent Labs    12/11/21 1000 12/12/21 0549 12/13/21 0551  NA 138 139 134*  K 3.4* 3.9 3.6  CL 104 107 101  CO2 '25 25 26  '$ GLUCOSE 108* 84 92  BUN '14 10 8  '$ CREATININE 0.96 0.86 0.74  CALCIUM 9.8 9.4 8.9   LFT Recent Labs    12/13/21 0551  PROT 6.9  ALBUMIN 3.9  AST 19  ALT 19  ALKPHOS 68  BILITOT 0.7   PT/INR Recent Labs    12/12/21 0919  LABPROT 13.1  INR 1.0    Studies/Results: MR ABDOMEN MRCP W WO CONTAST  Result Date: 12/11/2021 CLINICAL  DATA:  Biliary obstruction suspected EXAM: MRI ABDOMEN WITHOUT AND WITH CONTRAST (INCLUDING MRCP) TECHNIQUE: Multiplanar multisequence MR imaging of the abdomen was performed both before and after the administration of intravenous contrast. Heavily T2-weighted images of the biliary and pancreatic ducts were obtained, and three-dimensional MRCP images were rendered by post processing. CONTRAST:  64m GADAVIST GADOBUTROL 1 MMOL/ML IV SOLN COMPARISON:  CT abdomen and pelvis 12/03/2021 FINDINGS: Lower chest: No acute findings. Hepatobiliary: Liver is normal in size and contour with no suspicious mass identified. There is evidence of hepatic steatosis. Gallbladder is surgically absent. Moderate intra and extrahepatic biliary ductal dilatation with the common bile duct measuring up to 13 mm in diameter. There are filling defects visualized in the distal common bile duct measuring up to 9 mm in size consistent with choledocholithiasis and debris. Pancreas: Mild dilatation of the pancreatic duct measuring up to 6 mm in diameter in the head of the pancreas near the ampulla. No pancreatic mass or inflammatory changes identified. Spleen:  Within normal limits in size and appearance. Adrenals/Urinary Tract: No masses identified. No evidence of hydronephrosis. Stomach/Bowel: Visualized portions within the abdomen are unremarkable. Vascular/Lymphatic: No pathologically enlarged lymph nodes identified. No abdominal aortic aneurysm demonstrated. Other:  No ascites. Musculoskeletal: No suspicious bone lesions identified. IMPRESSION: 1. Choledocholithiasis with moderate intra and extrahepatic biliary ductal dilatation. 2. Hepatic steatosis. 3. Mild dilatation of  the pancreatic duct. Electronically Signed   By: Ofilia Neas M.D.   On: 12/11/2021 19:12   MR 3D Recon At Scanner  Result Date: 12/11/2021 CLINICAL DATA:  Biliary obstruction suspected EXAM: MRI ABDOMEN WITHOUT AND WITH CONTRAST (INCLUDING MRCP) TECHNIQUE:  Multiplanar multisequence MR imaging of the abdomen was performed both before and after the administration of intravenous contrast. Heavily T2-weighted images of the biliary and pancreatic ducts were obtained, and three-dimensional MRCP images were rendered by post processing. CONTRAST:  30m GADAVIST GADOBUTROL 1 MMOL/ML IV SOLN COMPARISON:  CT abdomen and pelvis 12/03/2021 FINDINGS: Lower chest: No acute findings. Hepatobiliary: Liver is normal in size and contour with no suspicious mass identified. There is evidence of hepatic steatosis. Gallbladder is surgically absent. Moderate intra and extrahepatic biliary ductal dilatation with the common bile duct measuring up to 13 mm in diameter. There are filling defects visualized in the distal common bile duct measuring up to 9 mm in size consistent with choledocholithiasis and debris. Pancreas: Mild dilatation of the pancreatic duct measuring up to 6 mm in diameter in the head of the pancreas near the ampulla. No pancreatic mass or inflammatory changes identified. Spleen:  Within normal limits in size and appearance. Adrenals/Urinary Tract: No masses identified. No evidence of hydronephrosis. Stomach/Bowel: Visualized portions within the abdomen are unremarkable. Vascular/Lymphatic: No pathologically enlarged lymph nodes identified. No abdominal aortic aneurysm demonstrated. Other:  No ascites. Musculoskeletal: No suspicious bone lesions identified. IMPRESSION: 1. Choledocholithiasis with moderate intra and extrahepatic biliary ductal dilatation. 2. Hepatic steatosis. 3. Mild dilatation of the pancreatic duct. Electronically Signed   By: DOfilia NeasM.D.   On: 12/11/2021 19:12       Assessment / Plan:    [age]year old white male with chronic abdominal pain/chronic pancreatitis, history of choledocholithiasis status post prior ERCP stone extraction October 2022, status post cholecystectomy now with worsening abdominal pain over the past month and multiple  ER visits  Is not had any evidence of acute pancreatitis, last CT 12/03/2021 did show progressive intra and extrahepatic ductal dilation and possible debris in the distal common bile duct, also noted to have pancreatic duct dilation  LFTs have been repeatedly normal  MRCP confirms distal CBD stones 1 measuring at least 9 mm and CBD of 13 mm, PD dilated to 6 mm in the head of the pancreas near the ampulla, no inflammatory changes identified  #2 chronic narcotic use/chronic abdominal pain  #3 hypertension  Plan:  Patient is scheduled for ERCP, possible extension of sphincterotomy and stone extraction with Dr. SFuller Plantoday. IV Cipro preprocedure,  No postprocedure NSAIDs suppository secondary to allergy  Other recommendations pending ERCP    LOS: 1 day   Amy Esterwood PA-C 12/13/2021, 8:44 AM    Attending Physician Note   I have taken an interval history, reviewed the chart and examined the patient. I performed a substantive portion of this encounter, including complete performance of at least one of the key components, in conjunction with the APP. I agree with the APP's note, impression and recommendations with my edits.   MLucio Edward MD FStat Specialty HospitalSee AMION, [facility], for our on call provider

## 2021-12-13 NOTE — Transfer of Care (Signed)
Immediate Anesthesia Transfer of Care Note  Patient: Choice Kleinsasser  Procedure(s) Performed: ENDOSCOPIC RETROGRADE CHOLANGIOPANCREATOGRAPHY (ERCP) REMOVAL OF STONES  Patient Location: PACU  Anesthesia Type:General  Level of Consciousness: awake  Airway & Oxygen Therapy: Patient Spontanous Breathing  Post-op Assessment: Report given to RN  Post vital signs: stable  Last Vitals:  Vitals Value Taken Time  BP 130/88 12/13/21 1400  Temp    Pulse 98 12/13/21 1408  Resp 14 12/13/21 1408  SpO2 94 % 12/13/21 1408  Vitals shown include unvalidated device data.  Last Pain:  Vitals:   12/13/21 1400  TempSrc:   PainSc: 0-No pain      Patients Stated Pain Goal: 5 (71/21/97 5883)  Complications: No notable events documented.

## 2021-12-13 NOTE — Op Note (Signed)
[facility] Patient Name: [name] Procedure Date: 12/13/2021 MRN: [id number] Attending MD: Ladene Artist , MD Date of Birth: January 26, 1976 CSN: [id number] Age: 46 Admit Type: Inpatient Procedure:                ERCP Indications:              Bile duct stone(s), Abdominal pain of suspected                            biliary or pancreatic origin, Abnormal MRCP Providers:                Pricilla Riffle. Fuller Plan, MD, Glori Bickers, RN, Cherylynn Ridges, Technician,Jeff Yonger CRNA Referring MD:             [facility] Medicines:                General Anesthesia Complications:            No immediate complications. Estimated Blood Loss:     Estimated blood loss: none. Procedure:                Pre-Anesthesia Assessment:                           - Prior to the procedure, a History and Physical                            was performed, and patient medications and                            allergies were reviewed. The patient's tolerance of                            previous anesthesia was also reviewed. The risks                            and benefits of the procedure and the sedation                            options and risks were discussed with the patient.                            All questions were answered, and informed consent                            was obtained. Prior Anticoagulants: The patient has                            taken no previous anticoagulant or antiplatelet                            agents. ASA Grade Assessment: III - A patient with  severe systemic disease. After reviewing the risks                            and benefits, the patient was deemed in                            satisfactory condition to undergo the procedure.                           After obtaining informed consent, the scope was                            passed under direct vision. Throughout the                            procedure,  the patient's blood pressure, pulse, and                            oxygen saturations were monitored continuously. The                            [id number]-Q190V ([id number] Olympus duodenoscope was                            introduced through the mouth, and used to inject                            contrast into and used to inject contrast into the                            bile duct. The ERCP was accomplished without                            difficulty. The patient tolerated the procedure                            well. Scope In: Scope Out: Findings:      A scout film of the abdomen was obtained. Surgical clips, consistent       with a previous cholecystectomy, were seen in the area of the right       upper quadrant of the abdomen. The scope was advanced to the major       papilla in the descending duodenum. Limited examination of the pharynx,       larynx and associated structures, and upper GI tract was normal. The       minor papilla was not seen. The major papilla had a prior       sphincterotomy, widely patent. A 0.035 inch x 260 cm straight Hydra       Jagwire was passed into the biliary tree. The short-nosed traction       sphincterotome was passed over the guidewire and the bile duct was then       deeply cannulated. Contrast was injected. I personally interpreted the       bile duct images. There was appropriate flow of contrast through the  ducts. The entire biliary tree was diffusely dilated, with stones sludge       causing an obstruction. The largest CBD diameter was 14 mm. A       cholecystectomy had been performed. The common bile duct contained       filling defects thought to be stones and sludge. The biliary tree was       swept with a 12-15 mm balloon starting at the bifurcation several times.       Sludge was swept from the duct. All stones were removed. The final       occlusion cholangiogram showed no filling defects. Air refluxed into the       CBD. Good  biliary drainage was noted. The 12 mm balloon pulled throught       the sphincterotomy. The PD was not cannulated or injected by intention. Impression:               - Prior biliary sphincterotomy, widely patent.                           - Filling defects consistent with stones and sludge                            were noted on the cholangiogram.                           - The entire biliary tree was dilated, with stones,                            sludge causing an obstruction.                           - Prior cholecystectomy.                           - Choledocholithiasis, sludge was found. Complete                            removal was accomplished by balloon extraction.                           - The biliary tree was swept and the CBD was                            cleared. Moderate Sedation:      Not Applicable - Patient had care per Anesthesia. Recommendation:           - Return patient to hospital ward for ongoing care.                           - Observe patient's clinical course following                            today's ERCP with therapeutic intervention.                           - Outpatient GI follow up with Dr. Scarlette Shorts. Procedure Code(s):        ---  Professional ---                           [id number], Endoscopic retrograde                            cholangiopancreatography (ERCP); with removal of                            calculi/debris from biliary/pancreatic duct(s) Diagnosis Code(s):        --- Professional ---                           R93.2, Abnormal findings on diagnostic imaging of                            liver and biliary tract                           K80.51, Calculus of bile duct without cholangitis                            or cholecystitis with obstruction                           Z90.49, Acquired absence of other specified parts                            of digestive tract                           R10.9, Unspecified abdominal pain CPT  copyright 2019 [employer] Medical Association. All rights reserved. The codes documented in this report are preliminary and upon coder review may  be revised to meet current compliance requirements. Ladene Artist, MD 12/13/2021 1:57:57 PM This report has been signed electronically. Number of Addenda: 0

## 2021-12-13 NOTE — Progress Notes (Addendum)
Patient ID: [name], male   DOB: August 02, 1975, 46 y.o.   MRN: [id number]    Progress Note   Subjective  Day #2 CC: Acute on chronic abdominal pain, choledocholithiasis  WBC 11.0/hemoglobin 14.6/platelets 189 Potassium 3.6 LFTs remain normal  Patient says his pain is better controlled since being in the hospital-more comfortable today says he still had a rough night No nausea or vomiting Remains afebrile    Objective   Vital signs in last 24 hours: Temp:  [98.3 F (36.8 C)-98.8 F (37.1 C)] 98.3 F (36.8 C) (07/19 0305) Pulse Rate:  [71-93] 93 (07/19 0830) Resp:  [16] 16 (07/19 0305) BP: (125-147)/(92-103) 133/103 (07/19 0830) SpO2:  [98 %] 98 % (07/19 0305) Last BM Date : 12/10/21 General:    White male in NAD Heart:  Regular rate and rhythm; no murmurs Lungs: Respirations even and unlabored, lungs CTA bilaterally Abdomen:  Soft, nondistended.  Mild tenderness across the upper abdomen/epigastric ,normal bowel sounds. Extremities:  Without edema. Neurologic:  Alert and oriented,  grossly normal neurologically. Psych:  Cooperative. Normal mood and affect.  Intake/Output from previous day: 07/18 0701 - 07/19 0700 In: 3280.1 [P.O.:590; I.V.:2690.1] Out: 900 [Urine:900] Intake/Output this shift: Total I/O In: -  Out: 350 [Urine:350]  Lab Results: Recent Labs    12/11/21 1000 12/12/21 0549 12/13/21 0551  WBC 8.8 11.8* 11.0*  HGB 15.5 14.9 14.6  HCT 42.9 43.5 42.1  PLT 236 198 189   BMET Recent Labs    12/11/21 1000 12/12/21 0549 12/13/21 0551  NA 138 139 134*  K 3.4* 3.9 3.6  CL 104 107 101  CO2 '25 25 26  '$ GLUCOSE 108* 84 92  BUN '14 10 8  '$ CREATININE 0.96 0.86 0.74  CALCIUM 9.8 9.4 8.9   LFT Recent Labs    12/13/21 0551  PROT 6.9  ALBUMIN 3.9  AST 19  ALT 19  ALKPHOS 68  BILITOT 0.7   PT/INR Recent Labs    12/12/21 0919  LABPROT 13.1  INR 1.0    Studies/Results: MR ABDOMEN MRCP W WO CONTAST  Result Date: 12/11/2021 CLINICAL  DATA:  Biliary obstruction suspected EXAM: MRI ABDOMEN WITHOUT AND WITH CONTRAST (INCLUDING MRCP) TECHNIQUE: Multiplanar multisequence MR imaging of the abdomen was performed both before and after the administration of intravenous contrast. Heavily T2-weighted images of the biliary and pancreatic ducts were obtained, and three-dimensional MRCP images were rendered by post processing. CONTRAST:  12m GADAVIST GADOBUTROL 1 MMOL/ML IV SOLN COMPARISON:  CT abdomen and pelvis 12/03/2021 FINDINGS: Lower chest: No acute findings. Hepatobiliary: Liver is normal in size and contour with no suspicious mass identified. There is evidence of hepatic steatosis. Gallbladder is surgically absent. Moderate intra and extrahepatic biliary ductal dilatation with the common bile duct measuring up to 13 mm in diameter. There are filling defects visualized in the distal common bile duct measuring up to 9 mm in size consistent with choledocholithiasis and debris. Pancreas: Mild dilatation of the pancreatic duct measuring up to 6 mm in diameter in the head of the pancreas near the ampulla. No pancreatic mass or inflammatory changes identified. Spleen:  Within normal limits in size and appearance. Adrenals/Urinary Tract: No masses identified. No evidence of hydronephrosis. Stomach/Bowel: Visualized portions within the abdomen are unremarkable. Vascular/Lymphatic: No pathologically enlarged lymph nodes identified. No abdominal aortic aneurysm demonstrated. Other:  No ascites. Musculoskeletal: No suspicious bone lesions identified. IMPRESSION: 1. Choledocholithiasis with moderate intra and extrahepatic biliary ductal dilatation. 2. Hepatic steatosis. 3. Mild dilatation of  the pancreatic duct. Electronically Signed   By: Ofilia Neas M.D.   On: 12/11/2021 19:12   MR 3D Recon At Scanner  Result Date: 12/11/2021 CLINICAL DATA:  Biliary obstruction suspected EXAM: MRI ABDOMEN WITHOUT AND WITH CONTRAST (INCLUDING MRCP) TECHNIQUE:  Multiplanar multisequence MR imaging of the abdomen was performed both before and after the administration of intravenous contrast. Heavily T2-weighted images of the biliary and pancreatic ducts were obtained, and three-dimensional MRCP images were rendered by post processing. CONTRAST:  76m GADAVIST GADOBUTROL 1 MMOL/ML IV SOLN COMPARISON:  CT abdomen and pelvis 12/03/2021 FINDINGS: Lower chest: No acute findings. Hepatobiliary: Liver is normal in size and contour with no suspicious mass identified. There is evidence of hepatic steatosis. Gallbladder is surgically absent. Moderate intra and extrahepatic biliary ductal dilatation with the common bile duct measuring up to 13 mm in diameter. There are filling defects visualized in the distal common bile duct measuring up to 9 mm in size consistent with choledocholithiasis and debris. Pancreas: Mild dilatation of the pancreatic duct measuring up to 6 mm in diameter in the head of the pancreas near the ampulla. No pancreatic mass or inflammatory changes identified. Spleen:  Within normal limits in size and appearance. Adrenals/Urinary Tract: No masses identified. No evidence of hydronephrosis. Stomach/Bowel: Visualized portions within the abdomen are unremarkable. Vascular/Lymphatic: No pathologically enlarged lymph nodes identified. No abdominal aortic aneurysm demonstrated. Other:  No ascites. Musculoskeletal: No suspicious bone lesions identified. IMPRESSION: 1. Choledocholithiasis with moderate intra and extrahepatic biliary ductal dilatation. 2. Hepatic steatosis. 3. Mild dilatation of the pancreatic duct. Electronically Signed   By: DOfilia NeasM.D.   On: 12/11/2021 19:12       Assessment / Plan:    #[age]year old white male with chronic abdominal pain/chronic pancreatitis, history of choledocholithiasis status post prior ERCP stone extraction October 2022, status post cholecystectomy now with worsening abdominal pain over the past month and multiple  ER visits  Is not had any evidence of acute pancreatitis, last CT 12/03/2021 did show progressive intra and extrahepatic ductal dilation and possible debris in the distal common bile duct, also noted to have pancreatic duct dilation  LFTs have been repeatedly normal  MRCP confirms distal CBD stones 1 measuring at least 9 mm and CBD of 13 mm, PD dilated to 6 mm in the head of the pancreas near the ampulla, no inflammatory changes identified  #2 chronic narcotic use/chronic abdominal pain  #3 hypertension  Plan:  Patient is scheduled for ERCP, possible extension of sphincterotomy and stone extraction with Dr. SFuller Plantoday. IV Cipro preprocedure,  No postprocedure NSAIDs suppository secondary to allergy  Other recommendations pending ERCP    LOS: 1 day   Amy Esterwood PA-C 12/13/2021, 8:44 AM    Attending Physician Note   I have taken an interval history, reviewed the chart and examined the patient. I performed a substantive portion of this encounter, including complete performance of at least one of the key components, in conjunction with the APP. I agree with the APP's note, impression and recommendations with my edits.   MLucio Edward MD [facility]ics[facility], [facility], for our on call provider

## 2021-12-13 NOTE — Anesthesia Preprocedure Evaluation (Addendum)
Anesthesia Evaluation  Patient identified by MRN, date of birth, ID band Patient awake    Reviewed: Allergy & Precautions, NPO status , Patient's Chart, lab work & pertinent test results  History of Anesthesia Complications Negative for: history of anesthetic complications  Airway Mallampati: II  TM Distance: >3 FB Neck ROM: Full    Dental  (+) Dental Advisory Given, Edentulous Upper, Edentulous Lower   Pulmonary Current Smoker,    Pulmonary exam normal breath sounds clear to auscultation       Cardiovascular hypertension, Pt. on medications and Pt. on home beta blockers Normal cardiovascular exam Rhythm:Regular Rate:Normal     Neuro/Psych negative neurological ROS     GI/Hepatic negative GI ROS, Neg liver ROS,   Endo/Other  negative endocrine ROS  Renal/GU Renal disease     Musculoskeletal negative musculoskeletal ROS (+)   Abdominal   Peds  Hematology negative hematology ROS (+)   Anesthesia Other Findings   Reproductive/Obstetrics                           Anesthesia Physical  Anesthesia Plan  ASA: 3  Anesthesia Plan: General   Post-op Pain Management: Minimal or no pain anticipated   Induction: Intravenous  PONV Risk Score and Plan: 2 and Ondansetron and Midazolam  Airway Management Planned: Oral ETT  Additional Equipment: None  Intra-op Plan:   Post-operative Plan: Extubation in OR  Informed Consent: I have reviewed the patients History and Physical, chart, labs and discussed the procedure including the risks, benefits and alternatives for the proposed anesthesia with the patient or authorized representative who has indicated his/her understanding and acceptance.       Plan Discussed with: CRNA  Anesthesia Plan Comments:       Anesthesia Quick Evaluation

## 2021-12-13 NOTE — Progress Notes (Signed)
Pt resting comfortably and tolerating fluids.

## 2021-12-13 NOTE — Anesthesia Procedure Notes (Signed)
Procedure Name: Intubation Date/Time: 12/13/2021 1:14 PM  Performed by: Dondrell Loudermilk, Forest Gleason, CRNAPre-anesthesia Checklist: Patient identified, Emergency Drugs available, Suction available, Patient being monitored and Timeout performed Patient Re-evaluated:Patient Re-evaluated prior to induction Oxygen Delivery Method: Circle system utilized Preoxygenation: Pre-oxygenation with 100% oxygen Induction Type: IV induction Ventilation: Mask ventilation without difficulty Laryngoscope Size: Mac and 4 Grade View: Grade I Tube type: Oral Tube size: 7.5 mm Number of attempts: 1 Placement Confirmation: ETT inserted through vocal cords under direct vision, positive ETCO2, CO2 detector and breath sounds checked- equal and bilateral Secured at: 22 cm Tube secured with: Tape Dental Injury: Teeth and Oropharynx as per pre-operative assessment

## 2021-12-14 DIAGNOSIS — R7989 Other specified abnormal findings of blood chemistry: Secondary | ICD-10-CM | POA: Diagnosis not present

## 2021-12-14 DIAGNOSIS — R1010 Upper abdominal pain, unspecified: Secondary | ICD-10-CM

## 2021-12-14 DIAGNOSIS — K8051 Calculus of bile duct without cholangitis or cholecystitis with obstruction: Secondary | ICD-10-CM | POA: Diagnosis not present

## 2021-12-14 LAB — CBC
HCT: 40 % (ref 39.0–52.0)
Hemoglobin: 13.7 g/dL (ref 13.0–17.0)
MCH: 30.2 pg (ref 26.0–34.0)
MCHC: 34.3 g/dL (ref 30.0–36.0)
MCV: 88.3 fL (ref 80.0–100.0)
Platelets: 160 10*3/uL (ref 150–400)
RBC: 4.53 MIL/uL (ref 4.22–5.81)
RDW: 12.2 % (ref 11.5–15.5)
WBC: 10.2 10*3/uL (ref 4.0–10.5)
nRBC: 0 % (ref 0.0–0.2)

## 2021-12-14 LAB — HEPATIC FUNCTION PANEL
ALT: 123 U/L — ABNORMAL HIGH (ref 0–44)
AST: 119 U/L — ABNORMAL HIGH (ref 15–41)
Albumin: 3.7 g/dL (ref 3.5–5.0)
Alkaline Phosphatase: 130 U/L — ABNORMAL HIGH (ref 38–126)
Bilirubin, Direct: 0.2 mg/dL (ref 0.0–0.2)
Indirect Bilirubin: 0.4 mg/dL (ref 0.3–0.9)
Total Bilirubin: 0.6 mg/dL (ref 0.3–1.2)
Total Protein: 6.8 g/dL (ref 6.5–8.1)

## 2021-12-14 LAB — BASIC METABOLIC PANEL
Anion gap: 7 (ref 5–15)
BUN: 8 mg/dL (ref 6–20)
CO2: 25 mmol/L (ref 22–32)
Calcium: 8.8 mg/dL — ABNORMAL LOW (ref 8.9–10.3)
Chloride: 108 mmol/L (ref 98–111)
Creatinine, Ser: 0.84 mg/dL (ref 0.61–1.24)
GFR, Estimated: >60 mL/min (ref >60)
Glucose, Bld: 96 mg/dL (ref 70–99)
Potassium: 3.7 mmol/L (ref 3.5–5.1)
Sodium: 140 mmol/L (ref 135–145)

## 2021-12-14 LAB — LIPASE, BLOOD: Lipase: 26 U/L (ref 11–51)

## 2021-12-14 MED ORDER — OXYCODONE HCL 5 MG PO TABS
10.0000 mg | ORAL_TABLET | Freq: Four times a day (QID) | ORAL | Status: DC | PRN
  Administered 2021-12-14: 10 mg via ORAL
  Filled 2021-12-14: qty 2

## 2021-12-14 MED ORDER — OXYCODONE HCL 10 MG PO TABS: 10.0000 mg | ORAL_TABLET | Freq: Four times a day (QID) | ORAL | 0 refills | Status: DC | PRN

## 2021-12-14 MED ORDER — HYDROMORPHONE HCL 1 MG/ML IJ SOLN
1.0000 mg | Freq: Four times a day (QID) | INTRAMUSCULAR | Status: DC | PRN
  Administered 2021-12-14: 1 mg via INTRAVENOUS
  Filled 2021-12-14: qty 1

## 2021-12-14 NOTE — Progress Notes (Addendum)
Progress Note   Subjective  Day #3 Chief Complaint: Acute on chronic abdominal pain and choledocholithiasis  Status post ERCP 12/13/2021 with removal of bile duct stones.  Today, patient describes that he is a little bit more uncomfortable this morning.  He tolerated his clear liquid diet which did not increase his pain or nausea but he does have baseline nausea and feels like he has epigastric pain very similar to his previous pancreatitis attacks.  This started just this morning.  No vomiting.   Objective   Vital signs in last 24 hours: Temp:  [98.2 F (36.8 C)-99.1 F (37.3 C)] 99.1 F (37.3 C) (07/20 0311) Pulse Rate:  [95-103] 96 (07/20 0311) Resp:  [14-20] 16 (07/20 0311) BP: (123-151)/(77-103) 146/95 (07/20 0311) SpO2:  [91 %-99 %] 91 % (07/20 0311) Last BM Date : 12/11/21 General:    white male in NAD Heart:  Regular rate and rhythm; no murmurs Lungs: Respirations even and unlabored, lungs CTA bilaterally Abdomen:  Soft, moderate/marked epigastric ttp and nondistended. Normal bowel sounds. Psych:  Cooperative. Normal mood and affect.  Intake/Output from previous day: 07/19 0701 - 07/20 0700 In: 2270 [P.O.:370; I.V.:1700; IV Piggyback:200] Out: 700 [Urine:700]  Lab Results: Recent Labs    12/12/21 0549 12/13/21 0551 12/14/21 0523  WBC 11.8* 11.0* 10.2  HGB 14.9 14.6 13.7  HCT 43.5 42.1 40.0  PLT 198 189 160   BMET Recent Labs    12/12/21 0549 12/13/21 0551 12/14/21 0523  NA 139 134* 140  K 3.9 3.6 3.7  CL 107 101 108  CO2 '25 26 25  '$ GLUCOSE 84 92 96  BUN '10 8 8  '$ CREATININE 0.86 0.74 0.84  CALCIUM 9.4 8.9 8.8*   LFT Recent Labs    12/13/21 0551  PROT 6.9  ALBUMIN 3.9  AST 19  ALT 19  ALKPHOS 68  BILITOT 0.7   PT/INR Recent Labs    12/12/21 0919  LABPROT 13.1  INR 1.0    Studies/Results: DG ERCP  Result Date: 12/13/2021 CLINICAL DATA:  ERCP with stone removal. EXAM: ERCP TECHNIQUE: Multiple spot images obtained with the  fluoroscopic device and submitted for interpretation post-procedure. FLUOROSCOPY TIME: FLUOROSCOPY TIME 5 minutes, 22 seconds (103.1 mGy) COMPARISON:  MRCP-12/11/2021; CT abdomen pelvis-12/03/2021 FINDINGS: 22 spot intra operative fluoroscopic images of the right upper abdominal quadrant during ERCP are provided for review. Initial image demonstrates an ERCP probe overlying the right upper abdominal quadrant. Cholecystectomy clips overlies expected location of the gallbladder fossa. Subsequent images demonstrate selective cannulation and opacification of the common bile duct which appears moderately dilated. Subsequent images demonstrate insufflation of a balloon within the central aspect of the CBD with subsequent biliary sweeping with presumed sphincterotomy. There is minimal opacification of the intrahepatic biliary tree which appears moderately dilated. There is minimal opacification of the residual cystic duct. No definitive opacification of the pancreatic duct. IMPRESSION: ERCP with biliary sweeping and presumed sphincterotomy as above. These images were submitted for radiologic interpretation only. Please see the procedural report for the amount of contrast and the fluoroscopy time utilized. Electronically Signed   By: Sandi Mariscal M.D.   On: 12/13/2021 16:29      Assessment / Plan:   Assessment: 1.  Chronic abdominal pain/chronic pancreatitis: History of choledocholithiasis status post ERCP and stone extraction October 2022, status postcholecystectomy now admitted with worsening abdominal pain over the past month, repeat ERCP 12/13/2021 with stone removal, this morning some increase in epigastric pain similar to his previous "pancreatitis"  per him (no evidence of acute pancreatitis and on MRCP at admission and lipase was normal) 2.  Chronic narcotic use/chronic abdominal pain  Plan: 1.  Added on a recheck of hepatic function panel today and added on a lipase given increase in epigastric pain this  morning and nausea with concern for post ERCP pancreatitis, especially since patient was unable to have NSAIDs suppository secondary to allergy.  He also does have a fair amount of chronic abdominal pain though. 2.  Continue clear liquid diet for now. 3.  Continue antiemetics and analgesics as needed  Thank you for your kind consultation, we will await labs from today and possibly sign off later this evening pending results.    LOS: 2 days   Levin Erp  12/14/2021, 11:33 AM    Attending Physician Note   I have taken an interval history, reviewed the chart and examined the patient. I performed a substantive portion of this encounter, including complete performance of at least one of the key components, in conjunction with the APP. I agree with the APP's note, impression and recommendations with my edits. My additional impressions and recommendations are as follows.   *Choledocholithiasis, biliary sludge removed at ERCP. Increase in LFTs due to ERCP which will improve over the next few weeks. No evidence of post ERCP pancreatitis.   *Chronic pancreatitis, chronic pain is likely the cause of his current abdominal pain. Continue chronic mgmt.   OK for discharge from GI standpoint. Outpatient GI follow up with Dr. Scarlette Shorts as planned.   Lucio Edward, MD [facility] See AMION,  GI, for our on call provider

## 2021-12-14 NOTE — Progress Notes (Deleted)
PROGRESS NOTE  [name] WEX:[id number] DOB: Feb 27, 1976 DOA: 12/11/2021 PCP: [place], Betty G, MD   LOS: 2 days   Brief Narrative / Interim history: 46 y.o. male with medical history significant of chronic biliary pancreatitis, cholangitis, history of ERCP and stone extraction in October 2022, cholecystectomy cirrhosis, hypertension admitted with severe abdominal upper abdominal pain for the past 1 week associated with nausea and vomiting.  Denies diarrhea.  Patient has seen [facility] as an outpatient and was planning to have MRCP done.  An MRI done during this admission showed CBD of 13 mm and filling defect measuring up to 9 mm in the distal CBD.  GI consulted, will have ERCP this afternoon  Subjective / 24h Interval events: Feels like his pancreas pain is quite significant today.  Assesement and Plan: Principal Problem:   Abdominal pain Active Problems:   Intractable nausea and vomiting   Calculus of bile duct without cholangitis with obstruction   Principal problem Abdominal pain with nausea due to choledocholithiasis-patient with history of chronic biliary pancreatitis and sphincterotomy admitted with recurrent abdominal pain.  Imaging on admission concerning for CBD dilatation as well as choledocholithiasis.  GI consulted, status post ERCP on 7/19 -With increased abdominal pain raising concern about post ERCP pancreatitis.  Has slight LFT elevation but lipase fortunately is normal.  Stay on clear liquids, GI following  Active problems HTN-on amlodipine, benazepril  Hyperlipidemia-on Crestor at home  Hypokalemia-potassium normalized this morning   Scheduled Meds:  amLODipine  5 mg Oral Daily   benazepril  40 mg Oral Daily   Continuous Infusions:  sodium chloride 75 mL/hr at 12/14/21 0514   PRN Meds:.HYDROmorphone (DILAUDID) injection, LORazepam, ondansetron (ZOFRAN) IV, mouth rinse, oxyCODONE  Diet Orders (From admission, onward)     Start     Ordered    12/13/21 1540  Diet clear liquid Room service appropriate? Yes; Fluid consistency: Thin  Diet effective now       Question Answer Comment  Room service appropriate? Yes   Fluid consistency: Thin      12/13/21 1439            DVT prophylaxis: SCDs Start: 12/11/21 1725   Lab Results  Component Value Date   PLT 160 12/14/2021      Code Status: Full Code  Family Communication: No family at bedside  Status is: Inpatient  Remains inpatient appropriate because: Worsening abdominal pain  Level of care: Med-Surg  Consultants:  GI  Objective: Vitals:   12/13/21 1420 12/13/21 1430 12/13/21 2044 12/14/21 0311  BP: (!) 126/97 (!) 130/93 (!) 147/91 (!) 146/95  Pulse: 99 100 99 96  Resp: '18 20 18 16  '$ Temp:  98.7 F (37.1 C) 99 F (37.2 C) 99.1 F (37.3 C)  TempSrc:  Oral  Oral  SpO2: 93% 95% 95% 91%  Weight:      Height:        Intake/Output Summary (Last 24 hours) at 12/14/2021 1308 Last data filed at 12/14/2021 0600 Gross per 24 hour  Intake 2270.01 ml  Output 200 ml  Net 2070.01 ml    Wt Readings from Last 3 Encounters:  12/11/21 97.5 kg  12/03/21 98.6 kg  11/14/21 98.6 kg    Examination:  Constitutional: NAD Eyes: lids and conjunctivae normal, no scleral icterus ENMT: mmm Neck: normal, supple Respiratory: clear to auscultation bilaterally, no wheezing, no crackles. Normal respiratory effort.  Cardiovascular: Regular rate and rhythm, no murmurs / rubs / gallops. No LE edema. Abdomen: Tender  to palpation in the epigastric area, no guarding or rebound.  Skin: no rashes Neurologic: no focal deficits, equal strength   Data Reviewed: I have independently reviewed following labs and imaging studies  CBC Recent Labs  Lab 12/07/21 2312 12/11/21 1000 12/12/21 0549 12/13/21 0551 12/14/21 0523  WBC 9.9 8.8 11.8* 11.0* 10.2  HGB 16.1 15.5 14.9 14.6 13.7  HCT 45.6 42.9 43.5 42.1 40.0  PLT 241 236 198 189 160  MCV 85.6 85.5 88.4 87.2 88.3  MCH 30.2 30.9  30.3 30.2 30.2  MCHC 35.3 36.1* 34.3 34.7 34.3  RDW 12.1 12.1 12.3 12.0 12.2  LYMPHSABS  --  2.6  --   --   --   MONOABS  --  1.0  --   --   --   EOSABS  --  0.3  --   --   --   BASOSABS  --  0.1  --   --   --      Recent Labs  Lab 12/07/21 2312 12/11/21 1000 12/12/21 0549 12/12/21 0919 12/13/21 0551 12/14/21 0523  NA 136 138 139  --  134* 140  K 3.5 3.4* 3.9  --  3.6 3.7  CL 105 104 107  --  101 108  CO2 20* 25 25  --  26 25  GLUCOSE 95 108* 84  --  92 96  BUN '11 14 10  '$ --  8 8  CREATININE 0.92 0.96 0.86  --  0.74 0.84  CALCIUM 9.8 9.8 9.4  --  8.9 8.8*  AST '18 18 21  '$ --  19 119*  ALT '23 20 23  '$ --  19 123*  ALKPHOS 70 83 67  --  68 130*  BILITOT 0.8 0.6 1.0  --  0.7 0.6  ALBUMIN 4.7 5.0 4.0  --  3.9 3.7  MG  --   --  2.2  --   --   --   INR  --   --   --  1.0  --   --      ------------------------------------------------------------------------------------------------------------------ No results for input(s): "CHOL", "HDL", "LDLCALC", "TRIG", "CHOLHDL", "LDLDIRECT" in the last 72 hours.  No results found for: "HGBA1C" ------------------------------------------------------------------------------------------------------------------ No results for input(s): "TSH", "T4TOTAL", "T3FREE", "THYROIDAB" in the last 72 hours.  Invalid input(s): "FREET3"  Cardiac Enzymes No results for input(s): "CKMB", "TROPONINI", "MYOGLOBIN" in the last 168 hours.  Invalid input(s): "CK" ------------------------------------------------------------------------------------------------------------------ No results found for: "BNP"  CBG: No results for input(s): "GLUCAP" in the last 168 hours.  No results found for this or any previous visit (from the past 240 hour(s)).   Radiology Studies: DG ERCP  Result Date: 12/13/2021 CLINICAL DATA:  ERCP with stone removal. EXAM: ERCP TECHNIQUE: Multiple spot images obtained with the fluoroscopic device and submitted for interpretation  post-procedure. FLUOROSCOPY TIME: FLUOROSCOPY TIME 5 minutes, 22 seconds (103.1 mGy) COMPARISON:  MRCP-12/11/2021; CT abdomen pelvis-12/03/2021 FINDINGS: 22 spot intra operative fluoroscopic images of the right upper abdominal quadrant during ERCP are provided for review. Initial image demonstrates an ERCP probe overlying the right upper abdominal quadrant. Cholecystectomy clips overlies expected location of the gallbladder fossa. Subsequent images demonstrate selective cannulation and opacification of the common bile duct which appears moderately dilated. Subsequent images demonstrate insufflation of a balloon within the central aspect of the CBD with subsequent biliary sweeping with presumed sphincterotomy. There is minimal opacification of the intrahepatic biliary tree which appears moderately dilated. There is minimal opacification of the residual cystic duct. No definitive opacification  of the pancreatic duct. IMPRESSION: ERCP with biliary sweeping and presumed sphincterotomy as above. These images were submitted for radiologic interpretation only. Please see the procedural report for the amount of contrast and the fluoroscopy time utilized. Electronically Signed   By: Sandi Mariscal M.D.   On: 12/13/2021 16:29     Marzetta Board, MD, PhD [facility]  Between 7 am - 7 pm I am available, please contact me via Amion (for emergencies) or Securechat (non urgent messages)  Between 7 pm - 7 am I am not available, please contact night coverage MD/APP via Amion

## 2021-12-14 NOTE — Discharge Summary (Signed)
Physician Discharge Summary  [name] TKW:[id number] DOB: [birth year] DOA: 12/11/2021  PCP: [place], Betty G, MD  Admit date: 12/11/2021 Discharge date: 12/14/2021  Admitted From: home Disposition:  home  Recommendations for Outpatient Follow-up:  Follow up with PCP in 1-2 weeks Please obtain BMP/CBC in one week  Home Health: none Equipment/Devices: none  Discharge Condition: stable CODE STATUS: Full code  HPI: Per admitting MD, [name] is a 46 y.o. male with medical history significant of chronic biliary pancreatitis, cholangitis, cirrhosis, hypertension admitted with severe abdominal upper abdominal pain for the past 1 week associated with nausea and vomiting.  Denies diarrhea.  Patient has seen [facility] as an outpatient and was planning to have MRCP done. Patient had appointment to see Dr. Henrene Pastor at the office on July 25. He denies fever chills dysuria chest pain shortness of breath or cough. No complaints of GERD.  No history of smoking alcohol use or illegal drug use. CT abdomen 12/03/2021 pneumobilia felt related to prior history of sphincterotomy interval increase in intra and extrahepatic biliary dilatation with common bile duct dilatation up to 15 mm, raising concern for acute obstruction.  Possible debris's or noncalcified stone in the distal common bile duct.  Mild dilatation of the proximal pancreatic duct. He reports he is very claustrophobic and requesting something to decrease anxiety prior to [place] / Discharge diagnoses: Principal Problem:   Abdominal pain Active Problems:   Intractable nausea and vomiting   Calculus of bile duct without cholangitis with obstruction   Elevated LFTs   Principal problem Abdominal pain with nausea due to choledocholithiasis-patient with history of chronic biliary pancreatitis and sphincterotomy admitted with recurrent abdominal pain.  Imaging on admission concerning for CBD dilatation as well as  choledocholithiasis.  Gastroenterology was consulted and followed patient while hospitalized.  He underwent an ERCP on 7/19.  He had slight LFT elevation following the ERCP but no evidence of pancreatitis with normal lipase.  He recovered well, tolerating a diet, GI cleared patient for discharge and will be sent home in stable condition.  Due to chronic pain caused by his chronic pancreatitis he will be given a limited course of pain medications on discharge.  Active problems HTN-continue home medications on discharge  Hyperlipidemia-on Crestor at home Hypokalemia-potassium normalized this morning  Sepsis ruled out   Discharge Instructions   Allergies as of 12/14/2021       Reactions   Cortisone Other (See Comments)   drops potassium level "bottoms out" potassium level drops potassium level Bottoms out potassium "bottoms out" potassium level Other reaction(s): Other (See Comments) Bottoms out potassium   Eggs Or Egg-derived Products Hives, Other (See Comments)   Rash Rash   Ketorolac Hives, Other (See Comments)   Hives, slightly labored breathing Hives, slightly labored breathing   Haldol [haloperidol Lactate] Other (See Comments)   "jittery"   Hydrocortisone Hives   Also drops potassium level   Iodinated Contrast Media Nausea And Vomiting, Other (See Comments)   Hives Hives Hives Hives   Ketorolac Tromethamine Hives   Reglan [metoclopramide]    Face "draws" and gets figgety   Compazine [prochlorperazine] Other (See Comments)   Dystonic reaction   Droperidol Other (See Comments)   Makes face draw up        Medication List     STOP taking these medications    amoxicillin-clavulanate 875-125 MG tablet Commonly known as: AUGMENTIN   oxyCODONE-acetaminophen 5-325 MG tablet Commonly known as: PERCOCET/ROXICET  TAKE these medications    acetaminophen 325 MG tablet Commonly known as: Tylenol Take 2 tablets (650 mg total) by mouth every 6 (six) hours as  needed. What changed: reasons to take this   amLODipine 5 MG tablet Commonly known as: NORVASC TAKE 1 TABLET (5 MG TOTAL) BY MOUTH DAILY.   benazepril 40 MG tablet Commonly known as: LOTENSIN TAKE 1 TABLET BY MOUTH EVERY DAY   fluticasone 50 MCG/ACT nasal spray Commonly known as: FLONASE PLACE 1 SPRAY INTO BOTH NOSTRILS 2 (TWO) TIMES DAILY What changed:  when to take this reasons to take this   Klor-Con M20 20 MEQ tablet Generic drug: potassium chloride SA TAKE 1 TABLET BY MOUTH EVERY DAY   metoprolol succinate 100 MG 24 hr tablet Commonly known as: TOPROL-XL TAKE 1 TABLET BY MOUTH EVERY DAY WITH OR IMMEDIATELY FOLLOWING A MEAL   ondansetron 4 MG disintegrating tablet Commonly known as: ZOFRAN-ODT Take 1 tablet (4 mg total) by mouth every 8 (eight) hours as needed for nausea or vomiting.   Oxycodone HCl 10 MG Tabs Take 1 tablet (10 mg total) by mouth every 6 (six) hours as needed for breakthrough pain or moderate pain. What changed:  medication strength how much to take when to take this reasons to take this   promethazine 25 MG tablet Commonly known as: PHENERGAN Take 1 tablet (25 mg total) by mouth every 6 (six) hours as needed for nausea or vomiting.   promethazine 25 MG suppository Commonly known as: PHENERGAN Place 1 suppository (25 mg total) rectally every 6 (six) hours as needed for nausea or vomiting.   rosuvastatin 10 MG tablet Commonly known as: CRESTOR TAKE 1 TABLET BY MOUTH EVERY DAY         Consultations: GI  Procedures/Studies:  DG ERCP  Result Date: 12/13/2021 CLINICAL DATA:  ERCP with stone removal. EXAM: ERCP TECHNIQUE: Multiple spot images obtained with the fluoroscopic device and submitted for interpretation post-procedure. FLUOROSCOPY TIME: FLUOROSCOPY TIME 5 minutes, 22 seconds (103.1 mGy) COMPARISON:  MRCP-12/11/2021; CT abdomen pelvis-12/03/2021 FINDINGS: 22 spot intra operative fluoroscopic images of the right upper abdominal  quadrant during ERCP are provided for review. Initial image demonstrates an ERCP probe overlying the right upper abdominal quadrant. Cholecystectomy clips overlies expected location of the gallbladder fossa. Subsequent images demonstrate selective cannulation and opacification of the common bile duct which appears moderately dilated. Subsequent images demonstrate insufflation of a balloon within the central aspect of the CBD with subsequent biliary sweeping with presumed sphincterotomy. There is minimal opacification of the intrahepatic biliary tree which appears moderately dilated. There is minimal opacification of the residual cystic duct. No definitive opacification of the pancreatic duct. IMPRESSION: ERCP with biliary sweeping and presumed sphincterotomy as above. These images were submitted for radiologic interpretation only. Please see the procedural report for the amount of contrast and the fluoroscopy time utilized. Electronically Signed   By: Sandi Mariscal M.D.   On: 12/13/2021 16:29   MR ABDOMEN MRCP W WO CONTAST  Result Date: 12/11/2021 CLINICAL DATA:  Biliary obstruction suspected EXAM: MRI ABDOMEN WITHOUT AND WITH CONTRAST (INCLUDING MRCP) TECHNIQUE: Multiplanar multisequence MR imaging of the abdomen was performed both before and after the administration of intravenous contrast. Heavily T2-weighted images of the biliary and pancreatic ducts were obtained, and three-dimensional MRCP images were rendered by post processing. CONTRAST:  84m GADAVIST GADOBUTROL 1 MMOL/ML IV SOLN COMPARISON:  CT abdomen and pelvis 12/03/2021 FINDINGS: Lower chest: No acute findings. Hepatobiliary: Liver is normal in size and  contour with no suspicious mass identified. There is evidence of hepatic steatosis. Gallbladder is surgically absent. Moderate intra and extrahepatic biliary ductal dilatation with the common bile duct measuring up to 13 mm in diameter. There are filling defects visualized in the distal common bile  duct measuring up to 9 mm in size consistent with choledocholithiasis and debris. Pancreas: Mild dilatation of the pancreatic duct measuring up to 6 mm in diameter in the head of the pancreas near the ampulla. No pancreatic mass or inflammatory changes identified. Spleen:  Within normal limits in size and appearance. Adrenals/Urinary Tract: No masses identified. No evidence of hydronephrosis. Stomach/Bowel: Visualized portions within the abdomen are unremarkable. Vascular/Lymphatic: No pathologically enlarged lymph nodes identified. No abdominal aortic aneurysm demonstrated. Other:  No ascites. Musculoskeletal: No suspicious bone lesions identified. IMPRESSION: 1. Choledocholithiasis with moderate intra and extrahepatic biliary ductal dilatation. 2. Hepatic steatosis. 3. Mild dilatation of the pancreatic duct. Electronically Signed   By: Ofilia Neas M.D.   On: 12/11/2021 19:12   MR 3D Recon At Scanner  Result Date: 12/11/2021 CLINICAL DATA:  Biliary obstruction suspected EXAM: MRI ABDOMEN WITHOUT AND WITH CONTRAST (INCLUDING MRCP) TECHNIQUE: Multiplanar multisequence MR imaging of the abdomen was performed both before and after the administration of intravenous contrast. Heavily T2-weighted images of the biliary and pancreatic ducts were obtained, and three-dimensional MRCP images were rendered by post processing. CONTRAST:  16m GADAVIST GADOBUTROL 1 MMOL/ML IV SOLN COMPARISON:  CT abdomen and pelvis 12/03/2021 FINDINGS: Lower chest: No acute findings. Hepatobiliary: Liver is normal in size and contour with no suspicious mass identified. There is evidence of hepatic steatosis. Gallbladder is surgically absent. Moderate intra and extrahepatic biliary ductal dilatation with the common bile duct measuring up to 13 mm in diameter. There are filling defects visualized in the distal common bile duct measuring up to 9 mm in size consistent with choledocholithiasis and debris. Pancreas: Mild dilatation of the  pancreatic duct measuring up to 6 mm in diameter in the head of the pancreas near the ampulla. No pancreatic mass or inflammatory changes identified. Spleen:  Within normal limits in size and appearance. Adrenals/Urinary Tract: No masses identified. No evidence of hydronephrosis. Stomach/Bowel: Visualized portions within the abdomen are unremarkable. Vascular/Lymphatic: No pathologically enlarged lymph nodes identified. No abdominal aortic aneurysm demonstrated. Other:  No ascites. Musculoskeletal: No suspicious bone lesions identified. IMPRESSION: 1. Choledocholithiasis with moderate intra and extrahepatic biliary ductal dilatation. 2. Hepatic steatosis. 3. Mild dilatation of the pancreatic duct. Electronically Signed   By: DOfilia NeasM.D.   On: 12/11/2021 19:12   CT ABDOMEN PELVIS W CONTRAST  Result Date: 12/03/2021 CLINICAL DATA:  Upper abdominal and epigastric pain EXAM: CT ABDOMEN AND PELVIS WITH CONTRAST TECHNIQUE: Multidetector CT imaging of the abdomen and pelvis was performed using the standard protocol following bolus administration of intravenous contrast. RADIATION DOSE REDUCTION: This exam was performed according to the departmental dose-optimization program which includes automated exposure control, adjustment of the mA and/or kV according to patient size and/or use of iterative reconstruction technique. CONTRAST:  1036mOMNIPAQUE IOHEXOL 300 MG/ML  SOLN COMPARISON:  CT 07/25/2021, 06/07/2021, MRCP 12/28/2019 FINDINGS: Lower chest: Lung bases demonstrate no acute airspace disease. Hepatobiliary: Pneumobilia. Interval increased intra and extrahepatic biliary dilatation. Common bile duct dilated up to 15 mm. Possible noncalcified stone or debris within the distal common bile duct, series 2, image 35. Proximal pancreatic duct also appears slightly enlarged compared to prior. Pancreas: No inflammatory change Spleen: Normal in size without focal abnormality. Adrenals/Urinary Tract:  Adrenal glands  are unremarkable. Kidneys are normal, without renal calculi, focal lesion, or hydronephrosis. Bladder is unremarkable. Stomach/Bowel: Stomach is within normal limits. Status post appendectomy. No evidence of bowel wall thickening, distention, or inflammatory changes. Vascular/Lymphatic: No significant vascular findings are present. No enlarged abdominal or pelvic lymph nodes. Mild atherosclerosis. Reproductive: Prostate is unremarkable. Other: Negative for pelvic effusion or free air. Musculoskeletal: No acute osseous abnormality IMPRESSION: 1. Pneumobilia felt related to prior history of sphincterotomy. Interval increase in intra and extrahepatic biliary dilatation with common bile duct dilatation up to 15 mm, raising concern for acute obstruction. Possible debris or noncalcified stone in the distal common bile duct. There is mild dilatation of the proximal pancreatic duct as well. Consider correlation with MR or ERCP. Electronically Signed   By: Donavan Foil M.D.   On: 12/03/2021 20:38     Subjective: - no chest pain, shortness of breath, no abdominal pain, nausea or vomiting.   Discharge Exam: BP 140/61 (BP Location: Right Arm)   Pulse 85   Temp 99 F (37.2 C) (Oral)   Resp 20   Ht '6\' 1"'$  (1.854 m)   Wt 97.5 kg   SpO2 96%   BMI 28.37 kg/m   General: Pt is alert, awake, not in acute distress Cardiovascular: RRR, S1/S2 +, no rubs, no gallops Respiratory: CTA bilaterally, no wheezing, no rhonchi Abdominal: Soft, NT, ND, bowel sounds + Extremities: no edema, no cyanosis    The results of significant diagnostics from this hospitalization (including imaging, microbiology, ancillary and laboratory) are listed below for reference.     Microbiology: No results found for this or any previous visit (from the past 240 hour(s)).   Labs: Basic Metabolic Panel: Recent Labs  Lab 12/07/21 2312 12/11/21 1000 12/12/21 0549 12/13/21 0551 12/14/21 0523  NA 136 138 139 134* 140  K 3.5 3.4* 3.9  3.6 3.7  CL 105 104 107 101 108  CO2 20* '25 25 26 25  '$ GLUCOSE 95 108* 84 92 96  BUN '11 14 10 8 8  '$ CREATININE 0.92 0.96 0.86 0.74 0.84  CALCIUM 9.8 9.8 9.4 8.9 8.8*  MG  --   --  2.2  --   --    Liver Function Tests: Recent Labs  Lab 12/07/21 2312 12/11/21 1000 12/12/21 0549 12/13/21 0551 12/14/21 0523  AST '18 18 21 19 '$ 119*  ALT '23 20 23 19 '$ 123*  ALKPHOS 70 83 67 68 130*  BILITOT 0.8 0.6 1.0 0.7 0.6  PROT 8.0 7.7 7.1 6.9 6.8  ALBUMIN 4.7 5.0 4.0 3.9 3.7   CBC: Recent Labs  Lab 12/07/21 2312 12/11/21 1000 12/12/21 0549 12/13/21 0551 12/14/21 0523  WBC 9.9 8.8 11.8* 11.0* 10.2  NEUTROABS  --  4.8  --   --   --   HGB 16.1 15.5 14.9 14.6 13.7  HCT 45.6 42.9 43.5 42.1 40.0  MCV 85.6 85.5 88.4 87.2 88.3  PLT 241 236 198 189 160   CBG: No results for input(s): "GLUCAP" in the last 168 hours. Hgb A1c No results for input(s): "HGBA1C" in the last 72 hours. Lipid Profile No results for input(s): "CHOL", "HDL", "LDLCALC", "TRIG", "CHOLHDL", "LDLDIRECT" in the last 72 hours. Thyroid function studies No results for input(s): "TSH", "T4TOTAL", "T3FREE", "THYROIDAB" in the last 72 hours.  Invalid input(s): "FREET3" Urinalysis    Component Value Date/Time   COLORURINE YELLOW 07/25/2021 0801   APPEARANCEUR CLEAR 07/25/2021 0801   LABSPEC >=1.030 07/25/2021 0801   PHURINE 5.5 07/25/2021  0801   GLUCOSEU NEGATIVE 07/25/2021 0801   GLUCOSEU NEGATIVE 04/18/2018 1150   HGBUR NEGATIVE 07/25/2021 0801   BILIRUBINUR NEGATIVE 07/25/2021 0801   KETONESUR NEGATIVE 07/25/2021 0801   PROTEINUR NEGATIVE 07/25/2021 0801   UROBILINOGEN 0.2 04/18/2018 1150   NITRITE NEGATIVE 07/25/2021 0801   LEUKOCYTESUR NEGATIVE 07/25/2021 0801    FURTHER DISCHARGE INSTRUCTIONS:   Get Medicines reviewed and adjusted: Please take all your medications with you for your next visit with your Primary MD   Laboratory/radiological data: Please request your Primary MD to go over all hospital tests and  procedure/radiological results at the follow up, please ask your Primary MD to get all Hospital records sent to his/her office.   In some cases, they will be blood work, cultures and biopsy results pending at the time of your discharge. Please request that your primary care M.D. goes through all the records of your hospital data and follows up on these results.   Also Note the following: If you experience worsening of your admission symptoms, develop shortness of breath, life threatening emergency, suicidal or homicidal thoughts you must seek medical attention immediately by calling 911 or calling your MD immediately  if symptoms less severe.   You must read complete instructions/literature along with all the possible adverse reactions/side effects for all the Medicines you take and that have been prescribed to you. Take any new Medicines after you have completely understood and accpet all the possible adverse reactions/side effects.    Do not drive when taking Pain medications or sleeping medications (Benzodaizepines)   Do not take more than prescribed Pain, Sleep and Anxiety Medications. It is not advisable to combine anxiety,sleep and pain medications without talking with your primary care practitioner   Special Instructions: If you have smoked or chewed Tobacco  in the last 2 yrs please stop smoking, stop any regular Alcohol  and or any Recreational drug use.   Wear Seat belts while driving.   Please note: You were cared for by a hospitalist during your hospital stay. Once you are discharged, your primary care physician will handle any further medical issues. Please note that NO REFILLS for any discharge medications will be authorized once you are discharged, as it is imperative that you return to your primary care physician (or establish a relationship with a primary care physician if you do not have one) for your post hospital discharge needs so that they can reassess your need for  medications and monitor your lab values.  Time coordinating discharge: 35 minutes  SIGNED:  Marzetta Board, MD, PhD 12/14/2021, 3:29 PM

## 2021-12-14 NOTE — Anesthesia Postprocedure Evaluation (Signed)
Anesthesia Post Note  Patient: [name]  Procedure(s) Performed: ENDOSCOPIC RETROGRADE CHOLANGIOPANCREATOGRAPHY (ERCP) REMOVAL OF STONES     Patient location during evaluation: Endoscopy Anesthesia Type: General Level of consciousness: sedated and patient cooperative Pain management: pain level controlled Vital Signs Assessment: post-procedure vital signs reviewed and stable Respiratory status: spontaneous breathing Cardiovascular status: stable Anesthetic complications: no   No notable events documented.  Last Vitals:  Vitals:   12/14/21 0311 12/14/21 1521  BP: (!) 146/95 140/61  Pulse: 96 85  Resp: 16 20  Temp: 37.3 C 37.2 C  SpO2: 91% 96%    Last Pain:  Vitals:   12/14/21 1521  TempSrc: Oral  PainSc:    Pain Goal: Patients Stated Pain Goal: 2 (12/14/21 0302)                 Nolon Nations

## 2021-12-15 ENCOUNTER — Telehealth: Payer: Self-pay

## 2021-12-15 ENCOUNTER — Ambulatory Visit (HOSPITAL_COMMUNITY): Payer: No Typology Code available for payment source

## 2021-12-15 NOTE — Telephone Encounter (Signed)
Transition Care Management Unsuccessful Follow-up Telephone Call  Date of discharge and from where:  12/14/2021 [name]  Attempts:  1st Attempt  Reason for unsuccessful TCM follow-up call:  Left voice message

## 2021-12-18 NOTE — Progress Notes (Signed)
HPI: [name] is a 46 y.o. male with hx of HTN,chronic pain, tobacco use,and HLD here today to follow on recent ED visit. He was seen here in the office on 11/14/21 for follow up. Since his last visit he has undergone ERCP, 12/13/21. CBD stones and sludge were removed. Pain has been worse after procedure. He has been in the ED for pain on 7/7,7/14,7/17,and today. Lipase was mildly elevated. Today she received oral Oxycodone 15 mg x 1 and phenergan IV. Zofran was not given because concerns about QT prolongation and Ketamine IV was recommended but he declined due to past hx of allergic reaction.  He follows with gastroenterologist regularly.  Lab Results  Component Value Date   LIPASE 55 (H) 12/19/2021   Mild hypoK+, usually present after episodes of vomiting. He is on KLOR 20 meq daily. BP was elevated during ED evaluation, 174/107. Reporting BP's < 140/90 when pain is not severe. He is on Amlodipine 5 mg daily and Benazepril 40 mg daily. Negative for severe/frequent headache, visual changes, chest pain, dyspnea,focal weakness, or edema.  Lab Results  Component Value Date   CREATININE 0.80 12/19/2021   BUN 15 12/19/2021   NA 140 12/19/2021   K 3.2 (L) 12/19/2021   CL 108 12/19/2021   CO2 25 12/19/2021   Lab Results  Component Value Date   WBC 8.6 12/19/2021   HGB 14.9 12/19/2021   HCT 41.6 12/19/2021   MCV 84.2 12/19/2021   PLT 186 12/19/2021   Lab Results  Component Value Date   ALT 38 12/19/2021   AST 19 12/19/2021   ALKPHOS 93 12/19/2021   BILITOT 0.5 12/19/2021   Review of Systems  Constitutional:  Positive for fatigue. Negative for appetite change and fever.  HENT:  Negative for facial swelling, mouth sores, nosebleeds and sore throat.   Eyes:  Negative for redness and visual disturbance.  Respiratory:  Negative for cough and wheezing.   Gastrointestinal:  Positive for abdominal pain, nausea and vomiting. Negative for blood in stool.   Genitourinary:  Negative for decreased urine volume, dysuria and hematuria.  Musculoskeletal:  Positive for back pain (chronic). Negative for gait problem.  Skin:  Negative for pallor and rash.  Neurological:  Negative for syncope, facial asymmetry and weakness.  Psychiatric/Behavioral:  Negative for confusion and hallucinations.   Rest see pertinent positives and negatives per HPI.  Current Outpatient Medications on File Prior to Visit  Medication Sig Dispense Refill   acetaminophen (TYLENOL) 325 MG tablet Take 2 tablets (650 mg total) by mouth every 6 (six) hours as needed. (Patient taking differently: Take 650 mg by mouth every 6 (six) hours as needed for moderate pain.) 30 tablet 0   amLODipine (NORVASC) 5 MG tablet TAKE 1 TABLET (5 MG TOTAL) BY MOUTH DAILY. 90 tablet 1   benazepril (LOTENSIN) 40 MG tablet TAKE 1 TABLET BY MOUTH EVERY DAY 90 tablet 1   fluticasone (FLONASE) 50 MCG/ACT nasal spray PLACE 1 SPRAY INTO BOTH NOSTRILS 2 (TWO) TIMES DAILY (Patient taking differently: Place 1 spray into both nostrils 2 (two) times daily as needed for allergies.) 48 mL 1   KLOR-CON M20 20 MEQ tablet TAKE 1 TABLET BY MOUTH EVERY DAY 90 tablet 0   metoprolol succinate (TOPROL-XL) 100 MG 24 hr tablet TAKE 1 TABLET BY MOUTH EVERY DAY WITH OR IMMEDIATELY FOLLOWING A MEAL 90 tablet 1   ondansetron (ZOFRAN-ODT) 4 MG disintegrating tablet Take 1 tablet (4 mg total) by mouth every 8 (eight)  hours as needed for nausea or vomiting. 20 tablet 0   oxyCODONE 10 MG TABS Take 1 tablet (10 mg total) by mouth every 6 (six) hours as needed for breakthrough pain or moderate pain. 20 tablet 0   promethazine (PHENERGAN) 25 MG suppository Place 1 suppository (25 mg total) rectally every 6 (six) hours as needed for nausea or vomiting. 12 each 0   promethazine (PHENERGAN) 25 MG tablet Take 1 tablet (25 mg total) by mouth every 6 (six) hours as needed for nausea or vomiting. 30 tablet 1   rosuvastatin (CRESTOR) 10 MG tablet  TAKE 1 TABLET BY MOUTH EVERY DAY 90 tablet 1   No current facility-administered medications on file prior to visit.   Past Medical History:  Diagnosis Date   Chronic abdominal pain    Diverticulitis    Drug-seeking behavior    Hypertension    Kidney stones    Pancreatitis, chronic ([place]) 05/28/2005   Allergies  Allergen Reactions   Cortisone Other (See Comments)    drops potassium level "bottoms out" potassium level drops potassium level Bottoms out potassium  "bottoms out" potassium level Other reaction(s): Other (See Comments) Bottoms out potassium   Eggs Or Egg-Derived Products Hives and Other (See Comments)    Rash  Rash   Ketorolac Hives and Other (See Comments)    Hives, slightly labored breathing  Hives, slightly labored breathing   Haldol [Haloperidol Lactate] Other (See Comments)    "jittery"    Hydrocortisone Hives    Also drops potassium level   Iodinated Contrast Media Nausea And Vomiting and Other (See Comments)    Hives Hives Hives Hives   Ketorolac Tromethamine Hives   Reglan [Metoclopramide]     Face "draws" and gets figgety   Compazine [Prochlorperazine] Other (See Comments)    Dystonic reaction   Droperidol Other (See Comments)    Makes face draw up   Ketamine     Patient reports " I am allergic to it."    Social History   Socioeconomic History   Marital status: Married    Spouse name: Not on file   Number of children: 1   Years of education: Not on file   Highest education level: Some college, no degree  Occupational History   Not on file  Tobacco Use   Smoking status: Every Day    Packs/day: 0.50    Types: Cigarettes   Smokeless tobacco: Never  Vaping Use   Vaping Use: Never used  Substance and Sexual Activity   Alcohol use: Not Currently   Drug use: No   Sexual activity: Not on file  Other Topics Concern   Not on file  Social History Narrative   Not on file   Social Determinants of Health   Financial Resource Strain:  Low Risk  (04/03/2021)   Overall Financial Resource Strain (CARDIA)    Difficulty of Paying Living Expenses: Not hard at all  Food Insecurity: No Food Insecurity (04/03/2021)   Hunger Vital Sign    Worried About Running Out of Food in the Last Year: Never true    Ran Out of Food in the Last Year: Never true  Transportation Needs: No Transportation Needs (04/03/2021)   PRAPARE - Hydrologist (Medical): No    Lack of Transportation (Non-Medical): No  Physical Activity: Insufficiently Active (04/03/2021)   Exercise Vital Sign    Days of Exercise per Week: 1 day    Minutes of Exercise per Session: 20  min  Stress: No Stress Concern Present (04/03/2021)   [place]    Feeling of Stress : Only a little  Social Connections: Socially Integrated (04/03/2021)   Social Connection and Isolation Panel [NHANES]    Frequency of Communication with Friends and Family: Three times a week    Frequency of Social Gatherings with Friends and Family: Once a week    Attends Religious Services: More than 4 times per year    Active Member of [employer] or Organizations: Yes    Attends Archivist Meetings: More than 4 times per year    Marital Status: Married   Vitals:   12/19/21 1039  BP: 136/80  Pulse: 93  Resp: 12  SpO2: 97%   Body mass index is 27.08 kg/m.  Physical Exam Vitals and nursing note reviewed.  Constitutional:      General: He is not in acute distress.    Appearance: He is well-developed and well-groomed.  HENT:     Head: Normocephalic and atraumatic.     Mouth/Throat:     Mouth: Mucous membranes are dry.  Eyes:     Conjunctiva/sclera: Conjunctivae normal.  Cardiovascular:     Rate and Rhythm: Normal rate and regular rhythm.     Heart sounds: No murmur heard. Pulmonary:     Effort: Pulmonary effort is normal. No respiratory distress.     Breath sounds: Normal breath sounds.   Abdominal:     Palpations: Abdomen is soft. There is no mass.     Tenderness: There is abdominal tenderness. There is no guarding or rebound.  Skin:    General: Skin is warm.     Findings: No erythema.  Neurological:     General: No focal deficit present.     Mental Status: He is alert and oriented to person, place, and time.  Psychiatric:        Mood and Affect: Affect normal.   ASSESSMENT AND PLAN:  [name] was seen today for follow-up.  Diagnoses and all orders for this visit:  Nausea and vomiting in adult Chronic. He had side effects with Reglan years ago. Continue Phenergan and Zofran. Continue following with GI.  Chronic abdominal pain He has been referred to pain management multiple times. Pain was not adequately controlled on ER and IR opioid, still having ED visits. My assistance called to and was told their office will contact pt within the next couple days.  Excuse letter for work given.  Elevated lipase Mild, it has been elevated intermittently in the past and s/p ERCP. Continue advancing diet as tolerated. Instructed about warning signs. He had appt with GI today but appt was cancelled and re-scheduled for next week.   Hypokalemia Mild. Continue KLOR 20 meq daily.  Hypertension, essential, benign Today adequately controlled. BP elevated when he has severe pain. Continue same dose of Amlodipine and Benazepril.  Return if symptoms worsen or fail to improve.  Reiley Bertagnolli G. [place], MD  [facility]. [place] office.

## 2021-12-18 NOTE — Telephone Encounter (Signed)
Magda Paganini I am working in [employer] and this pt sent a My Chart question about having an appt with him on 7/25 at 1140 am.  Per Dr Henrene Pastor he said to set him up for that day but I don't see a schedule for him in the office.  Vaughan Basta does not mention it in her message. Can you help me with this?  Was he suppose to be in the office?

## 2021-12-18 NOTE — Telephone Encounter (Signed)
Transition Care Management Unsuccessful Follow-up Telephone Call  Date of discharge and from where:  12/14/21 from [facility]  Attempts:  2nd Attempt  Reason for unsuccessful TCM follow-up call:  Left voice message

## 2021-12-19 ENCOUNTER — Emergency Department (HOSPITAL_BASED_OUTPATIENT_CLINIC_OR_DEPARTMENT_OTHER)
Admission: EM | Admit: 2021-12-19 | Discharge: 2021-12-19 | Disposition: A | Discharge status: Home / Self Care | Payer: No Typology Code available for payment source | Attending: Emergency Medicine | Admitting: Emergency Medicine

## 2021-12-19 ENCOUNTER — Encounter: Payer: Self-pay | Admitting: Family Medicine

## 2021-12-19 ENCOUNTER — Encounter (HOSPITAL_BASED_OUTPATIENT_CLINIC_OR_DEPARTMENT_OTHER): Payer: Self-pay | Admitting: Emergency Medicine

## 2021-12-19 ENCOUNTER — Ambulatory Visit (INDEPENDENT_AMBULATORY_CARE_PROVIDER_SITE_OTHER): Payer: No Typology Code available for payment source | Admitting: Family Medicine

## 2021-12-19 ENCOUNTER — Other Ambulatory Visit: Payer: Self-pay

## 2021-12-19 VITALS — BP 136/80 | HR 93 | Resp 12 | Ht 73.0 in | Wt 205.2 lb

## 2021-12-19 DIAGNOSIS — Z79899 Other long term (current) drug therapy: Secondary | ICD-10-CM | POA: Insufficient documentation

## 2021-12-19 DIAGNOSIS — R748 Abnormal levels of other serum enzymes: Secondary | ICD-10-CM

## 2021-12-19 DIAGNOSIS — G8929 Other chronic pain: Secondary | ICD-10-CM | POA: Diagnosis not present

## 2021-12-19 DIAGNOSIS — E876 Hypokalemia: Secondary | ICD-10-CM | POA: Diagnosis not present

## 2021-12-19 DIAGNOSIS — R1084 Generalized abdominal pain: Secondary | ICD-10-CM | POA: Diagnosis present

## 2021-12-19 DIAGNOSIS — R112 Nausea with vomiting, unspecified: Secondary | ICD-10-CM | POA: Diagnosis not present

## 2021-12-19 DIAGNOSIS — I10 Essential (primary) hypertension: Secondary | ICD-10-CM

## 2021-12-19 DIAGNOSIS — R109 Unspecified abdominal pain: Secondary | ICD-10-CM

## 2021-12-19 LAB — CBC WITH DIFFERENTIAL/PLATELET
Abs Immature Granulocytes: 0.04 10*3/uL (ref 0.00–0.07)
Basophils Absolute: 0.1 10*3/uL (ref 0.0–0.1)
Basophils Relative: 1 %
Eosinophils Absolute: 0.2 10*3/uL (ref 0.0–0.5)
Eosinophils Relative: 3 %
HCT: 41.6 % (ref 39.0–52.0)
Hemoglobin: 14.9 g/dL (ref 13.0–17.0)
Immature Granulocytes: 1 %
Lymphocytes Relative: 35 %
Lymphs Abs: 3 10*3/uL (ref 0.7–4.0)
MCH: 30.2 pg (ref 26.0–34.0)
MCHC: 35.8 g/dL (ref 30.0–36.0)
MCV: 84.2 fL (ref 80.0–100.0)
Monocytes Absolute: 1.1 10*3/uL — ABNORMAL HIGH (ref 0.1–1.0)
Monocytes Relative: 12 %
Neutro Abs: 4.2 10*3/uL (ref 1.7–7.7)
Neutrophils Relative %: 48 %
Platelets: 186 10*3/uL (ref 150–400)
RBC: 4.94 MIL/uL (ref 4.22–5.81)
RDW: 12.2 % (ref 11.5–15.5)
WBC: 8.6 10*3/uL (ref 4.0–10.5)
nRBC: 0 % (ref 0.0–0.2)

## 2021-12-19 LAB — COMPREHENSIVE METABOLIC PANEL
ALT: 38 U/L (ref 0–44)
AST: 19 U/L (ref 15–41)
Albumin: 3.9 g/dL (ref 3.5–5.0)
Alkaline Phosphatase: 93 U/L (ref 38–126)
Anion gap: 7 (ref 5–15)
BUN: 15 mg/dL (ref 6–20)
CO2: 25 mmol/L (ref 22–32)
Calcium: 8.9 mg/dL (ref 8.9–10.3)
Chloride: 108 mmol/L (ref 98–111)
Creatinine, Ser: 0.8 mg/dL (ref 0.61–1.24)
GFR, Estimated: >60 mL/min (ref >60)
Glucose, Bld: 100 mg/dL — ABNORMAL HIGH (ref 70–99)
Potassium: 3.2 mmol/L — ABNORMAL LOW (ref 3.5–5.1)
Sodium: 140 mmol/L (ref 135–145)
Total Bilirubin: 0.5 mg/dL (ref 0.3–1.2)
Total Protein: 7 g/dL (ref 6.5–8.1)

## 2021-12-19 LAB — LIPASE, BLOOD: Lipase: 55 U/L — ABNORMAL HIGH (ref 11–51)

## 2021-12-19 MED ORDER — PROMETHAZINE HCL 25 MG/ML IJ SOLN
INTRAMUSCULAR | Status: AC
  Filled 2021-12-19: qty 1

## 2021-12-19 MED ORDER — KETAMINE HCL 10 MG/ML IJ SOLN
20.0000 mg | Freq: Once | INTRAMUSCULAR | Status: DC
  Filled 2021-12-19: qty 1

## 2021-12-19 MED ORDER — ACETAMINOPHEN 500 MG PO TABS
1000.0000 mg | ORAL_TABLET | Freq: Once | ORAL | Status: AC
  Administered 2021-12-19: 1000 mg via ORAL
  Filled 2021-12-19: qty 2

## 2021-12-19 MED ORDER — ONDANSETRON HCL 4 MG/2ML IJ SOLN: 4.0000 mg | Freq: Once | INTRAMUSCULAR | Status: DC

## 2021-12-19 MED ORDER — SODIUM CHLORIDE 0.9 % IV SOLN: INTRAVENOUS | Status: DC | PRN

## 2021-12-19 MED ORDER — SODIUM CHLORIDE 0.9 % IV SOLN
12.5000 mg | INTRAVENOUS | Status: AC
  Administered 2021-12-19: 12.5 mg via INTRAVENOUS
  Filled 2021-12-19: qty 0.5

## 2021-12-19 MED ORDER — OXYCODONE HCL 5 MG PO TABS
15.0000 mg | ORAL_TABLET | ORAL | Status: AC
  Administered 2021-12-19: 15 mg via ORAL
  Filled 2021-12-19: qty 3

## 2021-12-19 NOTE — ED Triage Notes (Signed)
Patient reports discharged from hospital on Thursday. Patient here today for increase pain and nausea over the weekend. Patient has appointment with primary care provider today. Patient had an appointment with the GI provider but it was cancelled. Patient has been on liquid diet and has not drank any alcohol.

## 2021-12-19 NOTE — Patient Instructions (Addendum)
A few things to remember from today's visit:   Chronic abdominal pain  Elevated lipase  Nausea and vomiting in adult  Hypokalemia  If you need refills please call your pharmacy. Do not use My Chart to request refills or for acute issues that need immediate attention.   Pending appt with pain management. Keep appt with Dr Henrene Pastor. You have medications for nausea at home. Continue liquid/soft diet and advance as tolerated. Potassium rich diet.  Please be sure medication list is accurate. If a new problem present, please set up appointment sooner than planned today.

## 2021-12-19 NOTE — ED Provider Notes (Addendum)
[facility] EMERGENCY DEPARTMENT Provider Note   CSN: [id number] Arrival date & time: 12/19/21  0327     History  Chief Complaint  Patient presents with   Abdominal Pain    [name] is a 46 y.o. male.  The history is provided by the patient.  Abdominal Pain Pain location:  Generalized Pain radiates to:  Does not radiate Pain severity:  Severe Onset quality:  Gradual Duration: since discharge. Timing:  Constant Progression:  Unchanged Chronicity:  Chronic Context: not recent travel   Relieved by:  Nothing Worsened by:  Nothing Ineffective treatments:  None tried Associated symptoms: nausea and vomiting   Associated symptoms: no diarrhea and no fever   Risk factors: no alcohol abuse   Patient with chronic abdominal pain presents with ongoing pain since the week post discharge.  He reports he had an appointment with GI today and it was cancelled.       Home Medications Prior to Admission medications   Medication Sig Start Date End Date Taking? Authorizing Provider  acetaminophen (TYLENOL) 325 MG tablet Take 2 tablets (650 mg total) by mouth every 6 (six) hours as needed. Patient taking differently: Take 650 mg by mouth every 6 (six) hours as needed for moderate pain. 12/03/21   Tedd Sias, PA  amLODipine (NORVASC) 5 MG tablet TAKE 1 TABLET (5 MG TOTAL) BY MOUTH DAILY. 10/30/21   [place], Betty G, MD  benazepril (LOTENSIN) 40 MG tablet TAKE 1 TABLET BY MOUTH EVERY DAY 10/30/21   [place], Betty G, MD  fluticasone (FLONASE) 50 MCG/ACT nasal spray PLACE 1 SPRAY INTO BOTH NOSTRILS 2 (TWO) TIMES DAILY Patient taking differently: Place 1 spray into both nostrils 2 (two) times daily as needed for allergies. 10/30/21   [place], Betty G, MD  KLOR-CON M20 20 MEQ tablet TAKE 1 TABLET BY MOUTH EVERY DAY 10/10/21   [place], Betty G, MD  metoprolol succinate (TOPROL-XL) 100 MG 24 hr tablet TAKE 1 TABLET BY MOUTH EVERY DAY WITH OR IMMEDIATELY FOLLOWING A MEAL 10/30/21   [place],  Betty G, MD  ondansetron (ZOFRAN-ODT) 4 MG disintegrating tablet Take 1 tablet (4 mg total) by mouth every 8 (eight) hours as needed for nausea or vomiting. 12/03/21   Tedd Sias, PA  oxyCODONE 10 MG TABS Take 1 tablet (10 mg total) by mouth every 6 (six) hours as needed for breakthrough pain or moderate pain. 12/14/21   Caren Griffins, MD  promethazine (PHENERGAN) 25 MG suppository Place 1 suppository (25 mg total) rectally every 6 (six) hours as needed for nausea or vomiting. 07/27/58   Delora Fuel, MD  promethazine (PHENERGAN) 25 MG tablet Take 1 tablet (25 mg total) by mouth every 6 (six) hours as needed for nausea or vomiting. 07/12/21   Irene Shipper, MD  rosuvastatin (CRESTOR) 10 MG tablet TAKE 1 TABLET BY MOUTH EVERY DAY 09/18/21   [place], Betty G, MD      Allergies    Cortisone, Eggs or egg-derived products, Ketorolac, Haldol [haloperidol lactate], Hydrocortisone, Iodinated contrast media, Ketorolac tromethamine, Reglan [metoclopramide], Compazine [prochlorperazine], Droperidol, and Ketamine    Review of Systems   Review of Systems  Constitutional:  Negative for fever.  HENT:  Negative for facial swelling.   Eyes:  Negative for redness.  Respiratory:  Negative for wheezing and stridor.   Gastrointestinal:  Positive for abdominal pain, nausea and vomiting. Negative for diarrhea.  All other systems reviewed and are negative.   Physical Exam Updated Vital Signs  BP (!) 174/107 (BP Location: Right Arm)   Pulse 66   Temp 98.6 F (37 C) (Oral)   Resp 20   Ht '6\' 1"'$  (1.854 m)   Wt 97.5 kg   SpO2 100%   BMI 28.37 kg/m  Physical Exam Vitals and nursing note reviewed.  Constitutional:      General: He is not in acute distress.    Appearance: He is well-developed. He is not diaphoretic.  HENT:     Head: Normocephalic and atraumatic.     Nose: Nose normal.  Eyes:     Conjunctiva/sclera: Conjunctivae normal.     Pupils: Pupils are equal, round, and reactive to light.   Cardiovascular:     Rate and Rhythm: Normal rate and regular rhythm.  Pulmonary:     Effort: Pulmonary effort is normal.     Breath sounds: Normal breath sounds. No wheezing or rales.  Abdominal:     General: Bowel sounds are normal.     Palpations: Abdomen is soft.     Tenderness: There is no abdominal tenderness. There is no guarding or rebound.     Hernia: No hernia is present.  Musculoskeletal:        General: Normal range of motion.     Cervical back: Normal range of motion and neck supple.  Skin:    General: Skin is warm and dry.     Capillary Refill: Capillary refill takes less than 2 seconds.  Neurological:     General: No focal deficit present.     Mental Status: He is alert and oriented to person, place, and time.     ED Results / Procedures / Treatments   Labs (all labs ordered are listed, but only abnormal results are displayed) Labs Reviewed  CBC WITH DIFFERENTIAL/PLATELET - Abnormal; Notable for the following components:      Result Value   Monocytes Absolute 1.1 (*)    All other components within normal limits  COMPREHENSIVE METABOLIC PANEL - Abnormal; Notable for the following components:   Potassium 3.2 (*)    Glucose, Bld 100 (*)    All other components within normal limits  LIPASE, BLOOD - Abnormal; Notable for the following components:   Lipase 55 (*)    All other components within normal limits    EKG EKG Interpretation  Date/Time:  Tuesday December 19 2021 03:47:05 EDT Ventricular Rate:  63 PR Interval:  155 QRS Duration: 99 QT Interval:  465 QTC Calculation: 476 R Axis:   52 Text Interpretation: Sinus rhythm Borderline prolonged QT interval Confirmed by Dory Horn) on 12/19/2021 4:14:01 AM  Radiology No results found.  Procedures Procedures    Medications Ordered in ED Medications  ondansetron (ZOFRAN) injection 4 mg (4 mg Intravenous Not Given 12/19/21 0432)  ketamine (KETALAR) injection 20 mg (20 mg Intravenous Patient  Refused/Not Given 12/19/21 0433)  oxyCODONE (Oxy IR/ROXICODONE) immediate release tablet 15 mg (has no administration in time range)  acetaminophen (TYLENOL) tablet 1,000 mg (has no administration in time range)    ED Course/ Medical Decision Making/ A&P                           Medical Decision Making Patient s/p ERCP on 7/20 for ongoing pain presents with ongoing pain   Amount and/or Complexity of Data Reviewed External Data Reviewed: notes.    Details: previous notes reviewed Labs: ordered.    Details: all labs reviewed:  Normal white count  and hemoglobin.  Normal LFTs. Sodium is normal potassium slightly low at 3.2 normal creatinine.  lipase is 55. Discussion of management or test interpretation with external provider(s): Attempted secure chat with Dr. Silverio Decamp of GI.  Will set up appointment with patient   Risk OTC drugs. Prescription drug management. Risk Details: I attempted to given ketamine for chronic pain however when nurse went to administer it patient stated he was allergic.  There is no allergy listed in our system or care everywhere to this medication.  I find this concerning for drug seeking.  Exam is benign and reassuring.  No white count.  LFTs are back to normal.  I am not prescribing IV narcotics for ongoing pain.  Follow up with your pain management specialist and GI for ongoing care.     Final Clinical Impression(s) / ED Diagnoses Final diagnoses:  None   Return for intractable cough, coughing up blood, fevers > 100.4 unrelieved by medication, shortness of breath, intractable vomiting, chest pain, shortness of breath, weakness, numbness, changes in speech, facial asymmetry, abdominal pain, passing out, Inability to tolerate liquids or food, cough, altered mental status or any concerns. No signs of systemic illness or infection. The patient is nontoxic-appearing on exam and vital signs are within normal limits.  I have reviewed the triage vital signs and the nursing  notes. Pertinent labs & imaging results that were available during my care of the patient were reviewed by me and considered in my medical decision making (see chart for details). After history, exam, and medical workup I feel the patient has been appropriately medically screened and is safe for discharge home. Pertinent diagnoses were discussed with the patient. Patient was given return precautions.      Sangeeta Youse, MD 12/19/21 [phone]

## 2021-12-21 ENCOUNTER — Encounter (HOSPITAL_COMMUNITY): Payer: Self-pay

## 2021-12-21 ENCOUNTER — Emergency Department (HOSPITAL_COMMUNITY)
Admission: EM | Admit: 2021-12-21 | Discharge: 2021-12-21 | Disposition: A | Discharge status: Home / Self Care | Payer: No Typology Code available for payment source | Attending: Emergency Medicine | Admitting: Emergency Medicine

## 2021-12-21 ENCOUNTER — Other Ambulatory Visit: Payer: Self-pay

## 2021-12-21 DIAGNOSIS — G8929 Other chronic pain: Secondary | ICD-10-CM | POA: Diagnosis not present

## 2021-12-21 DIAGNOSIS — R1012 Left upper quadrant pain: Secondary | ICD-10-CM | POA: Insufficient documentation

## 2021-12-21 DIAGNOSIS — E876 Hypokalemia: Secondary | ICD-10-CM | POA: Diagnosis not present

## 2021-12-21 DIAGNOSIS — Z79899 Other long term (current) drug therapy: Secondary | ICD-10-CM | POA: Diagnosis not present

## 2021-12-21 DIAGNOSIS — R112 Nausea with vomiting, unspecified: Secondary | ICD-10-CM | POA: Diagnosis not present

## 2021-12-21 DIAGNOSIS — I10 Essential (primary) hypertension: Secondary | ICD-10-CM | POA: Insufficient documentation

## 2021-12-21 DIAGNOSIS — D72829 Elevated white blood cell count, unspecified: Secondary | ICD-10-CM | POA: Insufficient documentation

## 2021-12-21 DIAGNOSIS — R1013 Epigastric pain: Secondary | ICD-10-CM | POA: Diagnosis not present

## 2021-12-21 LAB — CBC
HCT: 46.6 % (ref 39.0–52.0)
Hemoglobin: 16.6 g/dL (ref 13.0–17.0)
MCH: 30.2 pg (ref 26.0–34.0)
MCHC: 35.6 g/dL (ref 30.0–36.0)
MCV: 84.9 fL (ref 80.0–100.0)
Platelets: 194 10*3/uL (ref 150–400)
RBC: 5.49 MIL/uL (ref 4.22–5.81)
RDW: 12.3 % (ref 11.5–15.5)
WBC: 12.3 10*3/uL — ABNORMAL HIGH (ref 4.0–10.5)
nRBC: 0 % (ref 0.0–0.2)

## 2021-12-21 LAB — COMPREHENSIVE METABOLIC PANEL
ALT: 34 U/L (ref 0–44)
AST: 25 U/L (ref 15–41)
Albumin: 4.4 g/dL (ref 3.5–5.0)
Alkaline Phosphatase: 93 U/L (ref 38–126)
Anion gap: 10 (ref 5–15)
BUN: 18 mg/dL (ref 6–20)
CO2: 24 mmol/L (ref 22–32)
Calcium: 9.9 mg/dL (ref 8.9–10.3)
Chloride: 108 mmol/L (ref 98–111)
Creatinine, Ser: 0.78 mg/dL (ref 0.61–1.24)
GFR, Estimated: >60 mL/min (ref >60)
Glucose, Bld: 98 mg/dL (ref 70–99)
Potassium: 3.3 mmol/L — ABNORMAL LOW (ref 3.5–5.1)
Sodium: 142 mmol/L (ref 135–145)
Total Bilirubin: 0.7 mg/dL (ref 0.3–1.2)
Total Protein: 7.7 g/dL (ref 6.5–8.1)

## 2021-12-21 LAB — URINALYSIS, ROUTINE W REFLEX MICROSCOPIC
Bilirubin Urine: NEGATIVE
Glucose, UA: NEGATIVE mg/dL
Hgb urine dipstick: NEGATIVE
Ketones, ur: NEGATIVE mg/dL
Leukocytes,Ua: NEGATIVE
Nitrite: NEGATIVE
Protein, ur: NEGATIVE mg/dL
Specific Gravity, Urine: 1.024 (ref 1.005–1.030)
pH: 5 (ref 5.0–8.0)

## 2021-12-21 LAB — LIPASE, BLOOD: Lipase: 42 U/L (ref 11–51)

## 2021-12-21 MED ORDER — SODIUM CHLORIDE 0.9 % IV BOLUS
1000.0000 mL | Freq: Once | INTRAVENOUS | Status: AC
  Administered 2021-12-21: 1000 mL via INTRAVENOUS

## 2021-12-21 MED ORDER — OXYCODONE HCL 5 MG PO TABS
10.0000 mg | ORAL_TABLET | Freq: Once | ORAL | Status: AC
  Administered 2021-12-21: 10 mg via ORAL
  Filled 2021-12-21: qty 2

## 2021-12-21 MED ORDER — HYDROMORPHONE HCL 1 MG/ML IJ SOLN
1.0000 mg | Freq: Once | INTRAMUSCULAR | Status: AC
  Administered 2021-12-21: 1 mg via INTRAVENOUS
  Filled 2021-12-21: qty 1

## 2021-12-21 MED ORDER — ONDANSETRON HCL 4 MG/2ML IJ SOLN
4.0000 mg | Freq: Once | INTRAMUSCULAR | Status: AC
Start: 2021-12-21 — End: 2021-12-21
  Administered 2021-12-21: 4 mg via INTRAVENOUS
  Filled 2021-12-21: qty 2

## 2021-12-21 NOTE — ED Provider Notes (Addendum)
[facility] DEPT Provider Note   CSN: [id number] Arrival date & time: 12/21/21  0430     History  Chief Complaint  Patient presents with   Abdominal Pain    [name] is a 46 y.o. male.  Patient with medical history significant of chronic abdominal pain, chronic biliary pancreatitis, cholangitis, cirrhosis, hypertension, ERCP 12/13/2021 during which CBD stones and sludge were removed --presents to the emergency department for continued acute chronic abdominal pain.  Patient was most recently seen in the emergency department 2 days ago.  He reports that he has been unable to keep down solids, liquids, medications over the past 36 hours.  Describes epigastric and left upper quadrant abdominal pain that radiates to the back.  This is his typical pain pattern.  Currently using Phenergan suppositories for nausea and vomiting.  Currently taking oxycodone for abdominal pain.  States that he has a GI appointment in 5 days.  No fevers, chest pain, shortness of breath.  No dysuria.  No diarrhea.       Home Medications Prior to Admission medications   Medication Sig Start Date End Date Taking? Authorizing Provider  acetaminophen (TYLENOL) 325 MG tablet Take 2 tablets (650 mg total) by mouth every 6 (six) hours as needed. Patient taking differently: Take 650 mg by mouth every 6 (six) hours as needed for moderate pain. 12/03/21   Tedd Sias, PA  amLODipine (NORVASC) 5 MG tablet TAKE 1 TABLET (5 MG TOTAL) BY MOUTH DAILY. 10/30/21   [place], Betty G, MD  benazepril (LOTENSIN) 40 MG tablet TAKE 1 TABLET BY MOUTH EVERY DAY 10/30/21   [place], Betty G, MD  fluticasone (FLONASE) 50 MCG/ACT nasal spray PLACE 1 SPRAY INTO BOTH NOSTRILS 2 (TWO) TIMES DAILY Patient taking differently: Place 1 spray into both nostrils 2 (two) times daily as needed for allergies. 10/30/21   [place], Betty G, MD  KLOR-CON M20 20 MEQ tablet TAKE 1 TABLET BY MOUTH EVERY DAY 10/10/21   [place], Betty  G, MD  metoprolol succinate (TOPROL-XL) 100 MG 24 hr tablet TAKE 1 TABLET BY MOUTH EVERY DAY WITH OR IMMEDIATELY FOLLOWING A MEAL 10/30/21   [place], Betty G, MD  ondansetron (ZOFRAN-ODT) 4 MG disintegrating tablet Take 1 tablet (4 mg total) by mouth every 8 (eight) hours as needed for nausea or vomiting. 12/03/21   Tedd Sias, PA  oxyCODONE 10 MG TABS Take 1 tablet (10 mg total) by mouth every 6 (six) hours as needed for breakthrough pain or moderate pain. 12/14/21   Caren Griffins, MD  promethazine (PHENERGAN) 25 MG suppository Place 1 suppository (25 mg total) rectally every 6 (six) hours as needed for nausea or vomiting. 08/03/63   Delora Fuel, MD  promethazine (PHENERGAN) 25 MG tablet Take 1 tablet (25 mg total) by mouth every 6 (six) hours as needed for nausea or vomiting. 07/12/21   Irene Shipper, MD  rosuvastatin (CRESTOR) 10 MG tablet TAKE 1 TABLET BY MOUTH EVERY DAY 09/18/21   [place], Betty G, MD      Allergies    Cortisone, Eggs or egg-derived products, Ketorolac, Haldol [haloperidol lactate], Hydrocortisone, Iodinated contrast media, Ketorolac tromethamine, Reglan [metoclopramide], Compazine [prochlorperazine], Droperidol, and Ketamine    Review of Systems   Review of Systems  Physical Exam Updated Vital Signs BP (!) 147/111   Pulse 66   Temp 98.3 F (36.8 C) (Oral)   Resp 18   Ht '6\' 1"'$  (1.854 m)   Wt 93 kg  SpO2 97%   BMI 27.05 kg/m   Physical Exam Vitals and nursing note reviewed.  Constitutional:      General: He is not in acute distress.    Appearance: He is well-developed.  HENT:     Head: Normocephalic and atraumatic.  Eyes:     General:        Right eye: No discharge.        Left eye: No discharge.     Conjunctiva/sclera: Conjunctivae normal.  Cardiovascular:     Rate and Rhythm: Normal rate and regular rhythm.     Heart sounds: Normal heart sounds.  Pulmonary:     Effort: Pulmonary effort is normal.     Breath sounds: Normal breath sounds.   Abdominal:     Palpations: Abdomen is soft.     Tenderness: There is abdominal tenderness (winces in pain with palpation) in the epigastric area and left upper quadrant.  Musculoskeletal:     Cervical back: Normal range of motion and neck supple.  Skin:    General: Skin is warm and dry.  Neurological:     Mental Status: He is alert.     ED Results / Procedures / Treatments   Labs (all labs ordered are listed, but only abnormal results are displayed) Labs Reviewed  COMPREHENSIVE METABOLIC PANEL - Abnormal; Notable for the following components:      Result Value   Potassium 3.3 (*)    All other components within normal limits  CBC - Abnormal; Notable for the following components:   WBC 12.3 (*)    All other components within normal limits  LIPASE, BLOOD  URINALYSIS, ROUTINE W REFLEX MICROSCOPIC    EKG None  Radiology No results found.  Procedures Procedures    Medications Ordered in ED Medications  HYDROmorphone (DILAUDID) injection 1 mg (has no administration in time range)  sodium chloride 0.9 % bolus 1,000 mL (has no administration in time range)  ondansetron (ZOFRAN) injection 4 mg (has no administration in time range)    ED Course/ Medical Decision Making/ A&P    Patient seen and examined. History obtained directly from patient. Work-up including labs, imaging, EKG ordered in triage, if performed, were reviewed.    Labs/EKG: Independently reviewed and interpreted.  This included: CBC with leukocytosis 12.3, normal hemoglobin otherwise unremarkable; CMP with mild hypokalemia 3.3, normal blood sugar, creatinine, transaminases; lipase normal today.  UA pending.  Imaging: Patient has had CT imaging, MRCP, ERCP in the past month.  Given reassuring labs today do not feel that additional imaging is indicated currently.  Medications/Fluids: Ordered: Dilaudid 1 mg x 1, Zofran 4 mg IV, IV fluid bolus.  Most recent vital signs reviewed and are as follows: BP (!)  147/111   Pulse 66   Temp 98.3 F (36.8 C) (Oral)   Resp 18   Ht '6\' 1"'$  (1.854 m)   Wt 93 kg   SpO2 97%   BMI 27.05 kg/m   Initial impression: Acute on chronic abdominal pain, no acute pancreatitis per lipase today.  8:47 AM Reassessment performed. Patient appears comfortable.  He has had a few sips of water, some nausea but no vomiting.  Labs personally reviewed and interpreted including: UA without signs of infection or severe concentration.  Reviewed pertinent lab work and imaging with patient at bedside. Questions answered.   Most current vital signs reviewed and are as follows: BP (!) 146/88   Pulse 65   Temp 97.8 F (36.6 C) (Oral)   Resp  18   Ht '6\' 1"'$  (1.854 m)   Wt 93 kg   SpO2 96%   BMI 27.05 kg/m   Plan: Discharge to home.   Prescriptions written for: None, patient reports that he has Phenergan at home.   Other home care instructions discussed: Nausea medicine on a scheduled basis, clear liquid to brat diet.  ED return instructions discussed: The patient was urged to return to the Emergency Department immediately with worsening of current symptoms, worsening abdominal pain, persistent vomiting, blood noted in stools, fever, or any other concerns. The patient verbalized understanding.   Follow-up instructions discussed: Patient encouraged to follow-up with their PCP in 2 days.  He has an appointment with Dr. Henrene Pastor of GI on 12/27/2021.                          Medical Decision Making Amount and/or Complexity of Data Reviewed Labs: ordered.  Risk Prescription drug management.   For this patient's complaint of abdominal pain, the following conditions were considered on the differential diagnosis: complication from recent ERCP, gastritis/PUD, enteritis/duodenitis, appendicitis, cholelithiasis/cholecystitis, cholangitis, pancreatitis, ruptured viscus, colitis, diverticulitis, small/large bowel obstruction, proctitis, cystitis, pyelonephritis, ureteral colic, aortic  dissection, aortic aneurysm. Atypical chest etiologies were also considered including ACS, PE, and pneumonia.   Low concern for recurrent pancreatitis today.  Suspect flare of patient's chronic symptoms today.  The patient's vital signs, pertinent lab work and imaging were reviewed and interpreted as discussed in the ED course. Hospitalization was considered for further testing, treatments, or serial exams/observation. However as patient is well-appearing, has a stable exam, and reassuring studies today, I do not feel that they warrant admission at this time. This plan was discussed with the patient who verbalizes agreement and comfort with this plan and seems reliable and able to return to the Emergency Department with worsening or changing symptoms.     Final Clinical Impression(s) / ED Diagnoses Final diagnoses:  Chronic abdominal pain    Rx / DC Orders ED Discharge Orders     None        Carlisle Cater, PA-C 12/21/21 [place], [place], DO 12/21/21 [phone]

## 2021-12-21 NOTE — ED Triage Notes (Signed)
Upper abd pain radiating through to back since d/c from hospital on Thursday s/p ERCP with gallstone removal. Patient also complaining of N/V/D. Oxycodone @ home with no relief. Denies ETOH use. GI appt on Tuesday.

## 2021-12-21 NOTE — Discharge Instructions (Signed)
Please read and follow all provided instructions.  Your diagnoses today include:  1. Chronic abdominal pain     Tests performed today include: Blood cell counts and platelets Kidney and liver function tests Pancreas function test (called lipase): was normal today Urine test to look for infection Vital signs. See below for your results today.   Medications prescribed:  None  Take any prescribed medications only as directed.  Home care instructions:  Follow any educational materials contained in this packet.  Follow-up instructions: Please follow-up with your primary care provider in the next 2 days for further evaluation of your symptoms.    Return instructions:  SEEK IMMEDIATE MEDICAL ATTENTION IF: The pain does not go away or becomes severe  A temperature above 101F develops  Repeated vomiting occurs (multiple episodes)  The pain becomes localized to portions of the abdomen. The right side could possibly be appendicitis. In an adult, the left lower portion of the abdomen could be colitis or diverticulitis.  Blood is being passed in stools or vomit (bright red or black tarry stools)  You develop chest pain, difficulty breathing, dizziness or fainting, or become confused, poorly responsive, or inconsolable (young children) If you have any other emergent concerns regarding your health  Additional Information: Abdominal (belly) pain can be caused by many things. Your caregiver performed an examination and possibly ordered blood/urine tests and imaging (CT scan, x-rays, ultrasound). Many cases can be observed and treated at home after initial evaluation in the emergency department. Even though you are being discharged home, abdominal pain can be unpredictable. Therefore, you need a repeated exam if your pain does not resolve, returns, or worsens. Most patients with abdominal pain don't have to be admitted to the hospital or have surgery, but serious problems like appendicitis and  gallbladder attacks can start out as nonspecific pain. Many abdominal conditions cannot be diagnosed in one visit, so follow-up evaluations are very important.  Your vital signs today were: BP (!) 146/88   Pulse 65   Temp 97.8 F (36.6 C) (Oral)   Resp 18   Ht '6\' 1"'$  (1.854 m)   Wt 93 kg   SpO2 96%   BMI 27.05 kg/m  If your blood pressure (bp) was elevated above 135/85 this visit, please have this repeated by your doctor within one month. --------------

## 2021-12-21 NOTE — ED Notes (Signed)
Ice water provided for fluid challenge

## 2021-12-27 ENCOUNTER — Ambulatory Visit: Payer: No Typology Code available for payment source | Admitting: Internal Medicine

## 2022-01-14 ENCOUNTER — Emergency Department (HOSPITAL_BASED_OUTPATIENT_CLINIC_OR_DEPARTMENT_OTHER)
Admission: EM | Admit: 2022-01-14 | Discharge: 2022-01-14 | Disposition: A | Discharge status: Home / Self Care | Payer: No Typology Code available for payment source | Attending: Emergency Medicine | Admitting: Emergency Medicine

## 2022-01-14 ENCOUNTER — Encounter (HOSPITAL_BASED_OUTPATIENT_CLINIC_OR_DEPARTMENT_OTHER): Payer: Self-pay

## 2022-01-14 ENCOUNTER — Other Ambulatory Visit: Payer: Self-pay

## 2022-01-14 DIAGNOSIS — R1013 Epigastric pain: Secondary | ICD-10-CM | POA: Diagnosis present

## 2022-01-14 DIAGNOSIS — I10 Essential (primary) hypertension: Secondary | ICD-10-CM | POA: Insufficient documentation

## 2022-01-14 DIAGNOSIS — Z79899 Other long term (current) drug therapy: Secondary | ICD-10-CM | POA: Insufficient documentation

## 2022-01-14 DIAGNOSIS — K861 Other chronic pancreatitis: Secondary | ICD-10-CM

## 2022-01-14 LAB — COMPREHENSIVE METABOLIC PANEL
ALT: 17 U/L (ref 0–44)
AST: 16 U/L (ref 15–41)
Albumin: 4.6 g/dL (ref 3.5–5.0)
Alkaline Phosphatase: 77 U/L (ref 38–126)
Anion gap: 8 (ref 5–15)
BUN: 13 mg/dL (ref 6–20)
CO2: 29 mmol/L (ref 22–32)
Calcium: 9.4 mg/dL (ref 8.9–10.3)
Chloride: 101 mmol/L (ref 98–111)
Creatinine, Ser: 0.84 mg/dL (ref 0.61–1.24)
GFR, Estimated: >60 mL/min (ref >60)
Glucose, Bld: 101 mg/dL — ABNORMAL HIGH (ref 70–99)
Potassium: 2.8 mmol/L — ABNORMAL LOW (ref 3.5–5.1)
Sodium: 138 mmol/L (ref 135–145)
Total Bilirubin: 0.5 mg/dL (ref 0.3–1.2)
Total Protein: 7.5 g/dL (ref 6.5–8.1)

## 2022-01-14 LAB — URINALYSIS, ROUTINE W REFLEX MICROSCOPIC
Bilirubin Urine: NEGATIVE
Glucose, UA: NEGATIVE mg/dL
Hgb urine dipstick: NEGATIVE
Ketones, ur: NEGATIVE mg/dL
Leukocytes,Ua: NEGATIVE
Nitrite: NEGATIVE
Protein, ur: NEGATIVE mg/dL
Specific Gravity, Urine: 1.012 (ref 1.005–1.030)
pH: 6 (ref 5.0–8.0)

## 2022-01-14 LAB — CBC
HCT: 44.8 % (ref 39.0–52.0)
Hemoglobin: 16.1 g/dL (ref 13.0–17.0)
MCH: 30.5 pg (ref 26.0–34.0)
MCHC: 35.9 g/dL (ref 30.0–36.0)
MCV: 84.8 fL (ref 80.0–100.0)
Platelets: 208 10*3/uL (ref 150–400)
RBC: 5.28 MIL/uL (ref 4.22–5.81)
RDW: 12.7 % (ref 11.5–15.5)
WBC: 11.5 10*3/uL — ABNORMAL HIGH (ref 4.0–10.5)
nRBC: 0 % (ref 0.0–0.2)

## 2022-01-14 LAB — LIPASE, BLOOD: Lipase: 18 U/L (ref 11–51)

## 2022-01-14 MED ORDER — HYDROMORPHONE HCL 1 MG/ML IJ SOLN
1.0000 mg | Freq: Once | INTRAMUSCULAR | Status: AC
  Administered 2022-01-14: 1 mg via INTRAVENOUS
  Filled 2022-01-14: qty 1

## 2022-01-14 MED ORDER — ONDANSETRON HCL 4 MG/2ML IJ SOLN
4.0000 mg | Freq: Once | INTRAMUSCULAR | Status: AC
  Administered 2022-01-14: 4 mg via INTRAVENOUS
  Filled 2022-01-14: qty 2

## 2022-01-14 MED ORDER — SODIUM CHLORIDE 0.9 % IV BOLUS
1000.0000 mL | Freq: Once | INTRAVENOUS | Status: AC
  Administered 2022-01-14: 1000 mL via INTRAVENOUS

## 2022-01-14 NOTE — Discharge Instructions (Signed)
Follow-up with your gastroenterologist and pain management.

## 2022-01-14 NOTE — ED Provider Notes (Signed)
[facility] EMERGENCY DEPT Provider Note   CSN: [id number] Arrival date & time: 01/14/22  0444     History  Chief Complaint  Patient presents with   Abdominal Pain    [name] is a 46 y.o. male.  Patient is a 46 year old male with past medical history of chronic pancreatitis with frequent ED visits, hypertension.  Patient presenting today with complaints of epigastric pain.  This feels similar to his prior flareups.  He denies fevers or chills.  He denies bloody stool or vomit.  Pain worse with movement and palpation with no alleviating factors.    The history is provided by the patient.       Home Medications Prior to Admission medications   Medication Sig Start Date End Date Taking? Authorizing Provider  acetaminophen (TYLENOL) 325 MG tablet Take 2 tablets (650 mg total) by mouth every 6 (six) hours as needed. Patient taking differently: Take 650 mg by mouth every 6 (six) hours as needed for moderate pain. 12/03/21   Tedd Sias, PA  amLODipine (NORVASC) 5 MG tablet TAKE 1 TABLET (5 MG TOTAL) BY MOUTH DAILY. 10/30/21   [place], Betty G, MD  benazepril (LOTENSIN) 40 MG tablet TAKE 1 TABLET BY MOUTH EVERY DAY 10/30/21   [place], Betty G, MD  fluticasone (FLONASE) 50 MCG/ACT nasal spray PLACE 1 SPRAY INTO BOTH NOSTRILS 2 (TWO) TIMES DAILY Patient taking differently: Place 1 spray into both nostrils 2 (two) times daily as needed for allergies. 10/30/21   [place], Betty G, MD  KLOR-CON M20 20 MEQ tablet TAKE 1 TABLET BY MOUTH EVERY DAY 10/10/21   [place], Betty G, MD  metoprolol succinate (TOPROL-XL) 100 MG 24 hr tablet TAKE 1 TABLET BY MOUTH EVERY DAY WITH OR IMMEDIATELY FOLLOWING A MEAL 10/30/21   [place], Betty G, MD  ondansetron (ZOFRAN-ODT) 4 MG disintegrating tablet Take 1 tablet (4 mg total) by mouth every 8 (eight) hours as needed for nausea or vomiting. 12/03/21   Tedd Sias, PA  oxyCODONE 10 MG TABS Take 1 tablet (10 mg total) by mouth every 6 (six) hours as  needed for breakthrough pain or moderate pain. 12/14/21   Caren Griffins, MD  promethazine (PHENERGAN) 25 MG suppository Place 1 suppository (25 mg total) rectally every 6 (six) hours as needed for nausea or vomiting. 9/51/88   Delora Fuel, MD  promethazine (PHENERGAN) 25 MG tablet Take 1 tablet (25 mg total) by mouth every 6 (six) hours as needed for nausea or vomiting. 07/12/21   Irene Shipper, MD  rosuvastatin (CRESTOR) 10 MG tablet TAKE 1 TABLET BY MOUTH EVERY DAY 09/18/21   [place], Betty G, MD      Allergies    Cortisone, Eggs or egg-derived products, Ketorolac, Haldol [haloperidol lactate], Hydrocortisone, Iodinated contrast media, Ketorolac tromethamine, Reglan [metoclopramide], Compazine [prochlorperazine], Droperidol, and Ketamine    Review of Systems   Review of Systems  All other systems reviewed and are negative.   Physical Exam Updated Vital Signs BP (!) 169/96 (BP Location: Left Arm)   Pulse 71   Temp 98.2 F (36.8 C) (Oral)   Resp 20   Ht '6\' 1"'$  (1.854 m)   Wt 93 kg   SpO2 99%   BMI 27.05 kg/m  Physical Exam Vitals and nursing note reviewed.  Constitutional:      General: He is not in acute distress.    Appearance: He is well-developed. He is not diaphoretic.  HENT:     Head: Normocephalic and  atraumatic.  Cardiovascular:     Rate and Rhythm: Normal rate and regular rhythm.     Heart sounds: No murmur heard.    No friction rub.  Pulmonary:     Effort: Pulmonary effort is normal. No respiratory distress.     Breath sounds: Normal breath sounds. No wheezing or rales.  Abdominal:     General: Bowel sounds are normal. There is no distension.     Palpations: Abdomen is soft.     Tenderness: There is abdominal tenderness in the epigastric area. There is no right CVA tenderness, left CVA tenderness, guarding or rebound.  Musculoskeletal:        General: Normal range of motion.     Cervical back: Normal range of motion and neck supple.  Skin:    General: Skin is  warm and dry.  Neurological:     Mental Status: He is alert and oriented to person, place, and time.     Coordination: Coordination normal.     ED Results / Procedures / Treatments   Labs (all labs ordered are listed, but only abnormal results are displayed) Labs Reviewed  LIPASE, BLOOD  COMPREHENSIVE METABOLIC PANEL  CBC  URINALYSIS, ROUTINE W REFLEX MICROSCOPIC    EKG EKG Interpretation  Date/Time:  Sunday January 14 2022 04:59:34 EDT Ventricular Rate:  69 PR Interval:  164 QRS Duration: 103 QT Interval:  448 QTC Calculation: 480 R Axis:   140 Text Interpretation: Sinus or ectopic atrial rhythm Right axis deviation Nonspecific T-wave abnormality Confirmed by Veryl Speak [phone]) on 01/14/2022 5:35:45 AM  Radiology No results found.  Procedures Procedures    Medications Ordered in ED Medications  sodium chloride 0.9 % bolus 1,000 mL (has no administration in time range)  ondansetron (ZOFRAN) injection 4 mg (has no administration in time range)  HYDROmorphone (DILAUDID) injection 1 mg (has no administration in time range)    ED Course/ Medical Decision Making/ A&P  Patient well-known to the emergency department with frequent visits involving chronic abdominal pain/chronic pancreatitis.  He returns today with a flareup of his pain.  Patient arrives here afebrile with stable vital signs.  Laboratory studies reveal white count of 11.5, but are otherwise unremarkable.  His lipase is 18 and LFTs are normal.  Patient appears in no significant discomfort after receiving 2 doses of Dilaudid and IV fluids.  Patient to be discharged with follow-up with his gastroenterologist.  Patient counseled that chronic pain needs to be dealt with in the outpatient setting, not to the emergency department, and that I am may be more reluctant in the future to give him additional Dilaudid.  Final Clinical Impression(s) / ED Diagnoses Final diagnoses:  None    Rx / DC Orders ED  Discharge Orders     None         Veryl Speak, MD 01/14/22 [phone]

## 2022-01-14 NOTE — ED Triage Notes (Signed)
LUQ abd pain radiating to back since Friday night. Pt reports h/x pancreatitis.

## 2022-02-05 ENCOUNTER — Encounter: Payer: Self-pay | Admitting: Family Medicine

## 2022-02-10 ENCOUNTER — Other Ambulatory Visit: Payer: Self-pay | Admitting: Family Medicine

## 2022-02-10 DIAGNOSIS — E876 Hypokalemia: Secondary | ICD-10-CM

## 2022-02-27 ENCOUNTER — Ambulatory Visit: Payer: No Typology Code available for payment source | Admitting: Internal Medicine

## 2022-03-03 ENCOUNTER — Other Ambulatory Visit: Payer: Self-pay | Admitting: Family Medicine

## 2022-03-03 DIAGNOSIS — I10 Essential (primary) hypertension: Secondary | ICD-10-CM

## 2022-03-14 NOTE — Progress Notes (Deleted)
ACUTE VISIT No chief complaint on file.  HPI: [name] is a 46 y.o. male, who is here today complaining of *** HPI  Review of Systems Rest see pertinent positives and negatives per HPI.  Current Outpatient Medications on File Prior to Visit  Medication Sig Dispense Refill  . acetaminophen (TYLENOL) 325 MG tablet Take 2 tablets (650 mg total) by mouth every 6 (six) hours as needed. (Patient taking differently: Take 650 mg by mouth every 6 (six) hours as needed for moderate pain.) 30 tablet 0  . amLODipine (NORVASC) 5 MG tablet TAKE 1 TABLET (5 MG TOTAL) BY MOUTH DAILY. 90 tablet 1  . benazepril (LOTENSIN) 40 MG tablet TAKE 1 TABLET BY MOUTH EVERY DAY 90 tablet 1  . fluticasone (FLONASE) 50 MCG/ACT nasal spray PLACE 1 SPRAY INTO BOTH NOSTRILS 2 (TWO) TIMES DAILY (Patient taking differently: Place 1 spray into both nostrils 2 (two) times daily as needed for allergies.) 48 mL 1  . KLOR-CON M20 20 MEQ tablet TAKE 1 TABLET BY MOUTH EVERY DAY 90 tablet 1  . metoprolol succinate (TOPROL-XL) 100 MG 24 hr tablet TAKE 1 TABLET BY MOUTH EVERY DAY WITH OR IMMEDIATELY FOLLOWING A MEAL 90 tablet 1  . ondansetron (ZOFRAN-ODT) 4 MG disintegrating tablet Take 1 tablet (4 mg total) by mouth every 8 (eight) hours as needed for nausea or vomiting. 20 tablet 0  . oxyCODONE 10 MG TABS Take 1 tablet (10 mg total) by mouth every 6 (six) hours as needed for breakthrough pain or moderate pain. 20 tablet 0  . promethazine (PHENERGAN) 25 MG suppository Place 1 suppository (25 mg total) rectally every 6 (six) hours as needed for nausea or vomiting. 12 each 0  . promethazine (PHENERGAN) 25 MG tablet Take 1 tablet (25 mg total) by mouth every 6 (six) hours as needed for nausea or vomiting. 30 tablet 1  . rosuvastatin (CRESTOR) 10 MG tablet TAKE 1 TABLET BY MOUTH EVERY DAY 90 tablet 1   No current facility-administered medications on file prior to visit.     Past Medical History:  Diagnosis Date  .  Chronic abdominal pain   . Diverticulitis   . Drug-seeking behavior   . Hypertension   . Kidney stones   . Pancreatitis, chronic ([place]) 05/28/2005   Allergies  Allergen Reactions  . Cortisone Other (See Comments)    drops potassium level "bottoms out" potassium level drops potassium level Bottoms out potassium  "bottoms out" potassium level Other reaction(s): Other (See Comments) Bottoms out potassium  . Eggs Or Egg-Derived Products Hives and Other (See Comments)    Rash  Rash  . Ketorolac Hives and Other (See Comments)    Hives, slightly labored breathing  Hives, slightly labored breathing  . Haldol [Haloperidol Lactate] Other (See Comments)    "jittery"   . Hydrocortisone Hives    Also drops potassium level  . Iodinated Contrast Media Nausea And Vomiting and Other (See Comments)    Hives Hives Hives Hives  . Ketorolac Tromethamine Hives  . Reglan [Metoclopramide]     Face "draws" and gets figgety  . Compazine [Prochlorperazine] Other (See Comments)    Dystonic reaction  . Droperidol Other (See Comments)    Makes face draw up  . Ketamine     Patient reports " I am allergic to it."    Social History   Socioeconomic History  . Marital status: Married    Spouse name: Not on file  . Number of children: 1  .  Years of education: Not on file  . Highest education level: Some college, no degree  Occupational History  . Not on file  Tobacco Use  . Smoking status: Every Day    Packs/day: 0.50    Types: Cigarettes  . Smokeless tobacco: Never  Vaping Use  . Vaping Use: Never used  Substance and Sexual Activity  . Alcohol use: Not Currently  . Drug use: No  . Sexual activity: Not on file  Other Topics Concern  . Not on file  Social History Narrative  . Not on file   Social Determinants of Health   Financial Resource Strain: Low Risk  (04/03/2021)   Overall Financial Resource Strain (CARDIA)   . Difficulty of Paying Living Expenses: Not hard at all  Food  Insecurity: No Food Insecurity (04/03/2021)   Hunger Vital Sign   . Worried About Charity fundraiser in the Last Year: Never true   . Ran Out of Food in the Last Year: Never true  Transportation Needs: No Transportation Needs (04/03/2021)   PRAPARE - Transportation   . Lack of Transportation (Medical): No   . Lack of Transportation (Non-Medical): No  Physical Activity: Insufficiently Active (04/03/2021)   Exercise Vital Sign   . Days of Exercise per Week: 1 day   . Minutes of Exercise per Session: 20 min  Stress: No Stress Concern Present (04/03/2021)   [place]   . Feeling of Stress : Only a little  Social Connections: Socially Integrated (04/03/2021)   Social Connection and Isolation Panel [NHANES]   . Frequency of Communication with Friends and Family: Three times a week   . Frequency of Social Gatherings with Friends and Family: Once a week   . Attends Religious Services: More than 4 times per year   . Active Member of Clubs or Organizations: Yes   . Attends Archivist Meetings: More than 4 times per year   . Marital Status: Married    There were no vitals filed for this visit. There is no height or weight on file to calculate BMI.  Physical Exam  ASSESSMENT AND PLAN:  There are no diagnoses linked to this encounter.   No follow-ups on file.   Betty G. Martinique, MD  [facility]. [place] office.  Discharge Instructions   None

## 2022-03-16 ENCOUNTER — Ambulatory Visit: Payer: No Typology Code available for payment source | Admitting: Family Medicine

## 2022-03-19 NOTE — Progress Notes (Addendum)
ACUTE VISIT Chief Complaint  Patient presents with   Anxiety   HPI: [name] is a 46 y.o. male with hx of chronic abdominal pain,HTN, tobacco use disorder,and HLD here today with above complaint. He reports a history of anxiety and has experienced it on and off for several years. Currently, he feels overwhelmed by life stressors, which have exacerbated his anxiety. He has a history of PTSD and has seen a psychiatrist in the past, but it has been over 10 years since his last visit.  His father with history of anxiety and depression, but there is no family history of bipolar disorder.  He also reports difficulty sleeping, averaging 2 to 3 hours of sleep per night.  He denies any thoughts of self-harm or harm to others.   In the past, he has tried various medications for anxiety, including Zoloft, Wellbutrin, Xanax, and another medication similar to Zoloft, but these medications either did not help or made his anxiety worse.  Anxiety Presents for initial visit. The problem has been gradually worsening. Symptoms include decreased concentration, depressed mood, excessive worry, insomnia, nausea (chronic), nervous/anxious behavior, panic and restlessness. Patient reports no chest pain, compulsions, confusion, dizziness, dry mouth, feeling of choking, hyperventilation, malaise, muscle tension, obsessions, palpitations, shortness of breath or suicidal ideas. Symptoms occur most days. The symptoms are aggravated by work stress. The quality of sleep is fair.    He has undergone psychotherapy in the past, which helped him manage his PTSD symptoms, still sometimes waking up with cold sweats and experiencing vivid dreams related to prior trauma.     03/20/2022    8:23 AM 12/19/2021   10:58 AM 11/14/2021    7:29 AM 08/07/2021    9:28 AM 06/09/2021   11:53 AM  Depression screen PHQ 2/9  Decreased Interest 2 1 0 0 0  Down, Depressed, Hopeless 1 1 0 0 0  PHQ - 2 Score 3 2 0 0 0  Altered  sleeping '2 1 2 1 1  '$ Tired, decreased energy '2 1 1 1 1  '$ Change in appetite '2 3 2 1 1  '$ Feeling bad or failure about yourself  1 0 0 0 0  Trouble concentrating 1 1 0 0 0  Moving slowly or fidgety/restless 3 0 0 0 0  Suicidal thoughts 0 0 0 0 0  PHQ-9 Score '14 8 5 3 3  '$ Difficult doing work/chores Very difficult Very difficult Somewhat difficult Somewhat difficult Not difficult at all      03/20/2022    3:04 PM  GAD 7 : Generalized Anxiety Score  Nervous, Anxious, on Edge 3  Control/stop worrying 2  Worry too much - different things 2  Trouble relaxing 3  Restless 3  Easily annoyed or irritable 3  Afraid - awful might happen 1  Total GAD 7 Score 17  Anxiety Difficulty Very difficult   During prior hospitalization QT prolongation was noted on EKG. He usually has an EKG in the ER before IV Zofran is administer. HTN on Metoprolol Succinate 100 mg daily, Amlodipine 5 mg daily, and Benazepril 40 mg daily.  Review of Systems  Respiratory:  Negative for cough, shortness of breath and wheezing.   Cardiovascular:  Negative for chest pain, palpitations and leg swelling.  Gastrointestinal:  Positive for abdominal pain (no more than usual) and nausea (chronic).  Endocrine: Negative for cold intolerance and heat intolerance.  Genitourinary:  Negative for decreased urine volume and hematuria.  Neurological:  Negative for dizziness, syncope,  facial asymmetry and weakness.  Psychiatric/Behavioral:  Positive for decreased concentration. Negative for confusion and suicidal ideas. The patient is nervous/anxious and has insomnia.   Rest see pertinent positives and negatives per HPI.  Current Outpatient Medications on File Prior to Visit  Medication Sig Dispense Refill   amLODipine (NORVASC) 5 MG tablet TAKE 1 TABLET (5 MG TOTAL) BY MOUTH DAILY. 90 tablet 1   benazepril (LOTENSIN) 40 MG tablet TAKE 1 TABLET BY MOUTH EVERY DAY 90 tablet 1   fluticasone (FLONASE) 50 MCG/ACT nasal spray PLACE 1 SPRAY  INTO BOTH NOSTRILS 2 (TWO) TIMES DAILY (Patient taking differently: Place 1 spray into both nostrils 2 (two) times daily as needed for allergies.) 48 mL 1   KLOR-CON M20 20 MEQ tablet TAKE 1 TABLET BY MOUTH EVERY DAY 90 tablet 1   metoprolol succinate (TOPROL-XL) 100 MG 24 hr tablet TAKE 1 TABLET BY MOUTH EVERY DAY WITH OR IMMEDIATELY FOLLOWING A MEAL 90 tablet 1   ondansetron (ZOFRAN-ODT) 4 MG disintegrating tablet Take 1 tablet (4 mg total) by mouth every 8 (eight) hours as needed for nausea or vomiting. 20 tablet 0   oxyCODONE 10 MG TABS Take 1 tablet (10 mg total) by mouth every 6 (six) hours as needed for breakthrough pain or moderate pain. 20 tablet 0   promethazine (PHENERGAN) 25 MG suppository Place 1 suppository (25 mg total) rectally every 6 (six) hours as needed for nausea or vomiting. 12 each 0   rosuvastatin (CRESTOR) 10 MG tablet TAKE 1 TABLET BY MOUTH EVERY DAY 90 tablet 1   No current facility-administered medications on file prior to visit.   Past Medical History:  Diagnosis Date   Chronic abdominal pain    Diverticulitis    Drug-seeking behavior    Hypertension    Kidney stones    Pancreatitis, chronic ([place]) 05/28/2005   Allergies  Allergen Reactions   Cortisone Other (See Comments)    drops potassium level "bottoms out" potassium level drops potassium level Bottoms out potassium  "bottoms out" potassium level Other reaction(s): Other (See Comments) Bottoms out potassium   Eggs Or Egg-Derived Products Hives and Other (See Comments)    Rash  Rash   Ketorolac Hives and Other (See Comments)    Hives, slightly labored breathing  Hives, slightly labored breathing   Haldol [Haloperidol Lactate] Other (See Comments)    "jittery"    Hydrocortisone Hives    Also drops potassium level   Iodinated Contrast Media Nausea And Vomiting and Other (See Comments)    Hives Hives Hives Hives   Ketorolac Tromethamine Hives   Reglan [Metoclopramide]     Face "draws" and  gets figgety   Compazine [Prochlorperazine] Other (See Comments)    Dystonic reaction   Droperidol Other (See Comments)    Makes face draw up   Ketamine     Patient reports " I am allergic to it."    Social History   Socioeconomic History   Marital status: Married    Spouse name: Not on file   Number of children: 1   Years of education: Not on file   Highest education level: Some college, no degree  Occupational History   Not on file  Tobacco Use   Smoking status: Every Day    Packs/day: 0.50    Types: Cigarettes   Smokeless tobacco: Never  Vaping Use   Vaping Use: Never used  Substance and Sexual Activity   Alcohol use: Not Currently   Drug use: No  Sexual activity: Not on file  Other Topics Concern   Not on file  Social History Narrative   Not on file   Social Determinants of Health   Financial Resource Strain: Low Risk  (04/03/2021)   Overall Financial Resource Strain (CARDIA)    Difficulty of Paying Living Expenses: Not hard at all  Food Insecurity: No Food Insecurity (04/03/2021)   Hunger Vital Sign    Worried About Running Out of Food in the Last Year: Never true    Ran Out of Food in the Last Year: Never true  Transportation Needs: No Transportation Needs (04/03/2021)   PRAPARE - Hydrologist (Medical): No    Lack of Transportation (Non-Medical): No  Physical Activity: Insufficiently Active (04/03/2021)   Exercise Vital Sign    Days of Exercise per Week: 1 day    Minutes of Exercise per Session: 20 min  Stress: No Stress Concern Present (04/03/2021)   [place]    Feeling of Stress : Only a little  Social Connections: Socially Integrated (04/03/2021)   Social Connection and Isolation Panel [NHANES]    Frequency of Communication with Friends and Family: Three times a week    Frequency of Social Gatherings with Friends and Family: Once a week    Attends Religious  Services: More than 4 times per year    Active Member of [employer] or Organizations: Yes    Attends Archivist Meetings: More than 4 times per year    Marital Status: Married   Vitals:   03/20/22 0805  BP: 128/80  Pulse: 76  Resp: 12  Temp: 98.3 F (36.8 C)  SpO2: 95%   Body mass index is 28.93 kg/m.  Physical Exam Nursing note reviewed.  Constitutional:      General: He is not in acute distress.    Appearance: He is well-developed.  HENT:     Head: Normocephalic and atraumatic.  Eyes:     Conjunctiva/sclera: Conjunctivae normal.  Cardiovascular:     Rate and Rhythm: Normal rate and regular rhythm.     Pulses:          Dorsalis pedis pulses are 2+ on the right side and 2+ on the left side.     Heart sounds: No murmur heard. Pulmonary:     Effort: Pulmonary effort is normal. No respiratory distress.     Breath sounds: Normal breath sounds.  Abdominal:     Palpations: Abdomen is soft. There is no hepatomegaly or mass.     Tenderness: There is no abdominal tenderness.  Lymphadenopathy:     Cervical: No cervical adenopathy.  Skin:    General: Skin is warm.     Findings: No erythema or rash.  Neurological:     Mental Status: He is alert and oriented to person, place, and time.     Cranial Nerves: No cranial nerve deficit.     Gait: Gait normal.  Psychiatric:     Comments: Well groomed, good eye contact.   ASSESSMENT AND PLAN:  [name] was seen today for anxiety.  Diagnoses and all orders for this visit:  Insomnia, unspecified type Good sleep hygiene. Mirtazapine 15 mg recommended, he will start medication Friday. We discussed side effects. CBT also recommended.  -     mirtazapine (REMERON SOL-TAB) 15 MG disintegrating tablet; Take 0.5-1 tablets (7.5-15 mg total) by mouth at bedtime.  Anxiety disorder, unspecified type We discussed treatment options, he has  failed a couple of SSRI's in the past. Xanax has helped in the past but I do not recommend it.   He agrees with trying Mirtazapine 15 mg , starting 7.5 mg at bedtime and can increase to the whole tab in 10-14 days of well tolerated. EKG 48-72 hours after starting medication. Hydralazine 25 mg for acute anxiety. Sided effects of medications discussed. List of psychotherapist at [facility] health given, so he can arrange an appt.  -     mirtazapine (REMERON SOL-TAB) 15 MG disintegrating tablet; Take 0.5-1 tablets (7.5-15 mg total) by mouth at bedtime. -     hydrOXYzine (ATARAX) 25 MG tablet; Take 1 tablet (25 mg total) by mouth 2 (two) times daily as needed for anxiety.  QT prolongation I do not appreciate abnormalities of recent prior EKG's. EKG order placed to be done after starting Mirtazapine. Instructed about warning signs.  Hypertension, essential, benign BP adequately controlled. No changes in Metoprolol succinate, Amlodipine,or Benazepril dose. Continue monitoring BP at home and following low salt diet.  Return in about 2 months (around 05/20/2022) for Monday for EKG..  Tanaka Gillen G. Martin[place], MD  [facility]. [place] office.

## 2022-03-20 ENCOUNTER — Ambulatory Visit (INDEPENDENT_AMBULATORY_CARE_PROVIDER_SITE_OTHER): Payer: No Typology Code available for payment source | Admitting: Family Medicine

## 2022-03-20 ENCOUNTER — Encounter: Payer: Self-pay | Admitting: Family Medicine

## 2022-03-20 VITALS — BP 128/80 | HR 76 | Temp 98.3°F | Resp 12 | Ht 73.0 in | Wt 219.2 lb

## 2022-03-20 DIAGNOSIS — G4700 Insomnia, unspecified: Secondary | ICD-10-CM

## 2022-03-20 DIAGNOSIS — R9431 Abnormal electrocardiogram [ECG] [EKG]: Secondary | ICD-10-CM

## 2022-03-20 DIAGNOSIS — F419 Anxiety disorder, unspecified: Secondary | ICD-10-CM | POA: Diagnosis not present

## 2022-03-20 DIAGNOSIS — I10 Essential (primary) hypertension: Secondary | ICD-10-CM

## 2022-03-20 MED ORDER — HYDROXYZINE HCL 25 MG PO TABS: 25.0000 mg | ORAL_TABLET | Freq: Two times a day (BID) | ORAL | 1 refills | Status: DC | PRN

## 2022-03-20 MED ORDER — MIRTAZAPINE 15 MG PO TBDP: 7.5000 mg | ORAL_TABLET | Freq: Every day | ORAL | 1 refills | Status: DC

## 2022-03-20 NOTE — Patient Instructions (Addendum)
A few things to remember from today's visit:   Insomnia, unspecified type - Plan: mirtazapine (REMERON SOL-TAB) 15 MG disintegrating tablet  Anxiety disorder, unspecified type - Plan: mirtazapine (REMERON SOL-TAB) 15 MG disintegrating tablet, hydrOXYzine (ATARAX) 25 MG tablet  Today 2 meds started. Mirtazapine, start 1/2 tab at bedtime and can increase to 1 tab in 10 days if well tolerated. We need an EKG in a few days after starting medication because your hx of QT prolongation. Last EKG has been stable. Hydroxyzine for acute anxiety, it also causes drowsiness. If you need refills for medications you take chronically, please call your pharmacy. Do not use My Chart to request refills or for acute issues that need immediate attention. If you send a my chart message, it may take a few days to be addressed, specially if I am not in the office.  Please be sure medication list is accurate. If a new problem present, please set up appointment sooner than planned today.  What is long QT syndrome? Long QT syndrome is a condition that affects the heart's electrical system. It sometimes leads to serious heart rhythm problems that can be life-threatening.  There are 2 main types of long QT syndrome:  ?A type that people are born with - This is caused by a specific change in a gene.  ?A type that happens later in life - People are not born with this type. Certain medicines can cause long QT syndrome. Mineral imbalances in the body, such as having too little potassium or magnesium, can also cause long QT syndrome.  What are the symptoms of long QT syndrome? Many people with long QT syndrome have no symptoms. They find out that they have it after they have a test called an electrocardiogram ("ECG") done for another reason. An ECG measures the electrical activity in the heart (figure 1).  When long QT syndrome causes symptoms, they can include:  ?Heartbeat changes, called "palpitations" - These can  feel like your heart is beating hard or fast, or skipping beats.  ?Fainting, or feeling like you are going to faint  ?Seizures - Seizures are waves of abnormal electrical activity in the brain that can make people pass out or move or behave strangely.  ?Sudden cardiac arrest - This is when the heart suddenly stops beating. It is a medical emergency that needs to be treated right away.  Is there a test for long QT syndrome? Yes. An ECG usually shows whether someone has long QT syndrome. But some people will have other tests, too. These can include:  ?Longer-term heart monitoring - There are several devices that can be used for this. A "Holter" monitor (figure 2) is a small, portable machine you wear that records all of your heart's electrical activity over 1 or 2 days. There are also newer types of monitors called "patch" monitors. These go directly on the skin, without wires, and can be worn for up to 30 days. You wear these monitors all of the time while you do your usual activities.  ?Stress test - During this test, a doctor, nurse, or physician assistant records your ECG while you exercise on a treadmill or bike, or while you get medicine to make your heart pump faster (figure 3).  ?Tests to see how your heartbeat changes when you get certain medicines  ?Blood tests - These include tests to check mineral levels and to look for specific changes in the gene that causes long QT syndrome.  How is long QT  syndrome treated? Treatment depends mostly on whether you were born with long QT syndrome or developed it later in life.  For people who were born with long QT syndrome, the main treatment includes medicines called beta blockers. These medicines help keep the heart from beating too fast. Some people need other treatments, too. These can include:  ?Other types of heart medicines  ?Pacemaker - This is a device that goes under the skin near a person's heart (figure 4). It sends electrical  signals to the heart to control the heartbeat.  ?Implantable cardioverter-defibrillator ("ICD") - This is a device that goes under a person's skin near their heart (figure 5). It can sense abnormal heartbeats and then treat them with an electrical shock.  For people who develop long QT syndrome later in life, treatment can include:  ?Stopping any medicine that could be causing the long QT syndrome  ?Fixing the mineral imbalance that is causing the long QT syndrome  ?A pacemaker (figure 4)  ?Medicines that control the speed or rhythm of the heartbeat  All people with long QT syndrome who have a sudden cardiac arrest should be treated with defibrillation. This involves using a device to send an electrical shock to the heart. It sometimes works to get a normal heart rhythm started again.  What else should I do? If you have long QT syndrome, you should:  ?Follow all of your doctor's instructions about follow-up tests, so they can monitor your condition.  ?Avoid taking medicines that are likely to cause long QT syndrome. Your doctor can give you a list of these medicines or refer you to a website that has updated lists of medicines.  ?Let your family members know. Because long QT syndrome can run in families, they might need to be tested for the condition, too.  ?Ask your doctor or nurse whether you need to make any lifestyle changes. With some types of long QT syndrome that people are born with, abnormal heart rhythms are triggered by certain things. These might include intense exercise, loud or sudden noises, or diving into cold water. If you have this type of long QT syndrome, avoid these triggers as much as possible.

## 2022-03-25 ENCOUNTER — Other Ambulatory Visit: Payer: Self-pay | Admitting: Family Medicine

## 2022-03-26 ENCOUNTER — Ambulatory Visit: Payer: No Typology Code available for payment source | Admitting: Family Medicine

## 2022-04-20 ENCOUNTER — Other Ambulatory Visit: Payer: Self-pay

## 2022-04-20 ENCOUNTER — Emergency Department (HOSPITAL_BASED_OUTPATIENT_CLINIC_OR_DEPARTMENT_OTHER)
Admission: EM | Admit: 2022-04-20 | Discharge: 2022-04-20 | Disposition: A | Discharge status: Home / Self Care | Payer: No Typology Code available for payment source | Attending: Emergency Medicine | Admitting: Emergency Medicine

## 2022-04-20 ENCOUNTER — Encounter (HOSPITAL_BASED_OUTPATIENT_CLINIC_OR_DEPARTMENT_OTHER): Payer: Self-pay | Admitting: Urology

## 2022-04-20 DIAGNOSIS — R109 Unspecified abdominal pain: Secondary | ICD-10-CM | POA: Insufficient documentation

## 2022-04-20 LAB — COMPREHENSIVE METABOLIC PANEL
ALT: 24 U/L (ref 0–44)
AST: 22 U/L (ref 15–41)
Albumin: 4 g/dL (ref 3.5–5.0)
Alkaline Phosphatase: 79 U/L (ref 38–126)
Anion gap: 8 (ref 5–15)
BUN: 14 mg/dL (ref 6–20)
CO2: 22 mmol/L (ref 22–32)
Calcium: 9.5 mg/dL (ref 8.9–10.3)
Chloride: 110 mmol/L (ref 98–111)
Creatinine, Ser: 0.86 mg/dL (ref 0.61–1.24)
GFR, Estimated: >60 mL/min (ref >60)
Glucose, Bld: 110 mg/dL — ABNORMAL HIGH (ref 70–99)
Potassium: 3.1 mmol/L — ABNORMAL LOW (ref 3.5–5.1)
Sodium: 140 mmol/L (ref 135–145)
Total Bilirubin: 0.5 mg/dL (ref 0.3–1.2)
Total Protein: 7.3 g/dL (ref 6.5–8.1)

## 2022-04-20 LAB — CBC WITH DIFFERENTIAL/PLATELET
Abs Immature Granulocytes: 0.11 10*3/uL — ABNORMAL HIGH (ref 0.00–0.07)
Basophils Absolute: 0.1 10*3/uL (ref 0.0–0.1)
Basophils Relative: 1 %
Eosinophils Absolute: 0.3 10*3/uL (ref 0.0–0.5)
Eosinophils Relative: 3 %
HCT: 44 % (ref 39.0–52.0)
Hemoglobin: 15.7 g/dL (ref 13.0–17.0)
Immature Granulocytes: 1 %
Lymphocytes Relative: 27 %
Lymphs Abs: 3.4 10*3/uL (ref 0.7–4.0)
MCH: 30.4 pg (ref 26.0–34.0)
MCHC: 35.7 g/dL (ref 30.0–36.0)
MCV: 85.1 fL (ref 80.0–100.0)
Monocytes Absolute: 1.3 10*3/uL — ABNORMAL HIGH (ref 0.1–1.0)
Monocytes Relative: 10 %
Neutro Abs: 7.5 10*3/uL (ref 1.7–7.7)
Neutrophils Relative %: 58 %
Platelets: 227 10*3/uL (ref 150–400)
RBC: 5.17 MIL/uL (ref 4.22–5.81)
RDW: 12.9 % (ref 11.5–15.5)
WBC: 12.7 10*3/uL — ABNORMAL HIGH (ref 4.0–10.5)
nRBC: 0 % (ref 0.0–0.2)

## 2022-04-20 LAB — LIPASE, BLOOD: Lipase: 48 U/L (ref 11–51)

## 2022-04-20 MED ORDER — HYDROMORPHONE HCL 1 MG/ML IJ SOLN
1.0000 mg | Freq: Once | INTRAMUSCULAR | Status: AC
  Administered 2022-04-20: 1 mg via INTRAVENOUS
  Filled 2022-04-20: qty 1

## 2022-04-20 MED ORDER — LACTATED RINGERS IV BOLUS
1000.0000 mL | Freq: Once | INTRAVENOUS | Status: AC
  Administered 2022-04-20: 1000 mL via INTRAVENOUS

## 2022-04-20 MED ORDER — SODIUM CHLORIDE 0.9 % IV SOLN
12.5000 mg | Freq: Four times a day (QID) | INTRAVENOUS | Status: DC | PRN
  Administered 2022-04-20: 12.5 mg via INTRAVENOUS
  Filled 2022-04-20: qty 0.5

## 2022-04-20 NOTE — ED Provider Notes (Signed)
[place] EMERGENCY DEPARTMENT Provider Note   CSN: [id number] Arrival date & time: 04/20/22  0202     History  Chief Complaint  Patient presents with   Abdominal Pain    [name] is a 46 y.o. male.  History of recurrent pancreatitis episodes. Has had pain for the last few days. No home pain meds. No exacerbating factors. Associated with n/v. No fevers. No radaition but seems to be across the top of his abdomen. No diarrhea or constipation. No other associated symptoms.    Abdominal Pain      Home Medications Prior to Admission medications   Medication Sig Start Date End Date Taking? Authorizing Provider  amLODipine (NORVASC) 5 MG tablet TAKE 1 TABLET (5 MG TOTAL) BY MOUTH DAILY. 03/05/22   Martinique, Betty G, MD  benazepril (LOTENSIN) 40 MG tablet TAKE 1 TABLET BY MOUTH EVERY DAY 03/05/22   [place], Betty G, MD  fluticasone (FLONASE) 50 MCG/ACT nasal spray PLACE 1 SPRAY INTO BOTH NOSTRILS 2 (TWO) TIMES DAILY Patient taking differently: Place 1 spray into both nostrils 2 (two) times daily as needed for allergies. 10/30/21   [place], Betty G, MD  hydrOXYzine (ATARAX) 25 MG tablet Take 1 tablet (25 mg total) by mouth 2 (two) times daily as needed for anxiety. 03/20/22   [place], Betty G, MD  KLOR-CON M20 20 MEQ tablet TAKE 1 TABLET BY MOUTH EVERY DAY 02/12/22   [place], Betty G, MD  metoprolol succinate (TOPROL-XL) 100 MG 24 hr tablet TAKE 1 TABLET BY MOUTH EVERY DAY WITH OR IMMEDIATELY FOLLOWING A MEAL 10/30/21   [place], Betty G, MD  mirtazapine (REMERON SOL-TAB) 15 MG disintegrating tablet Take 0.5-1 tablets (7.5-15 mg total) by mouth at bedtime. 03/20/22   [place], Betty G, MD  ondansetron (ZOFRAN-ODT) 4 MG disintegrating tablet Take 1 tablet (4 mg total) by mouth every 8 (eight) hours as needed for nausea or vomiting. 12/03/21   Tedd Sias, PA  oxyCODONE 10 MG TABS Take 1 tablet (10 mg total) by mouth every 6 (six) hours as needed for breakthrough pain or moderate  pain. 12/14/21   Caren Griffins, MD  promethazine (PHENERGAN) 25 MG suppository Place 1 suppository (25 mg total) rectally every 6 (six) hours as needed for nausea or vomiting. 1/44/31   Delora Fuel, MD  rosuvastatin (CRESTOR) 10 MG tablet TAKE 1 TABLET BY MOUTH EVERY DAY 03/26/22   [place], Betty G, MD      Allergies    Cortisone, Eggs or egg-derived products, Ketorolac, Haldol [haloperidol lactate], Hydrocortisone, Iodinated contrast media, Ketorolac tromethamine, Reglan [metoclopramide], Compazine [prochlorperazine], Droperidol, and Ketamine    Review of Systems   Review of Systems  Gastrointestinal:  Positive for abdominal pain.    Physical Exam Updated Vital Signs BP (!) 133/90 (BP Location: Left Arm)   Pulse 67   Temp 98 F (36.7 C) (Oral)   Resp 16   Ht '6\' 1"'$  (1.854 m)   Wt 93.9 kg   SpO2 95%   BMI 27.31 kg/m  Physical Exam Vitals and nursing note reviewed.  Constitutional:      Appearance: He is well-developed.  HENT:     Head: Normocephalic and atraumatic.  Cardiovascular:     Rate and Rhythm: Normal rate.  Pulmonary:     Effort: Pulmonary effort is normal. No respiratory distress.  Abdominal:     General: There is no distension.     Tenderness: There is generalized abdominal tenderness. There is no guarding or rebound.  Musculoskeletal:        General: Normal range of motion.     Cervical back: Normal range of motion.  Neurological:     Mental Status: He is alert.     ED Results / Procedures / Treatments   Labs (all labs ordered are listed, but only abnormal results are displayed) Labs Reviewed  CBC WITH DIFFERENTIAL/PLATELET - Abnormal; Notable for the following components:      Result Value   WBC 12.7 (*)    Monocytes Absolute 1.3 (*)    Abs Immature Granulocytes 0.11 (*)    All other components within normal limits  COMPREHENSIVE METABOLIC PANEL - Abnormal; Notable for the following components:   Potassium 3.1 (*)    Glucose, Bld 110 (*)     All other components within normal limits  LIPASE, BLOOD    EKG None  Radiology No results found.  Procedures Procedures    Medications Ordered in ED Medications  promethazine (PHENERGAN) 12.5 mg in sodium chloride 0.9 % 50 mL IVPB (0 mg Intravenous Stopped 04/20/22 0418)  lactated ringers bolus 1,000 mL (0 mLs Intravenous Stopped 04/20/22 0417)  HYDROmorphone (DILAUDID) injection 1 mg (1 mg Intravenous Given 04/20/22 0309)  HYDROmorphone (DILAUDID) injection 1 mg (1 mg Intravenous Given 04/20/22 0453)    ED Course/ Medical Decision Making/ A&P                           Medical Decision Making Amount and/or Complexity of Data Reviewed Labs: ordered. ECG/medicine tests: ordered.  Risk Prescription drug management.   Labs reassuring. Symptoms improved. No vomiting. Low suspicion for emergent abdominal issues requiring ct scan or other imgaing modalities. Will fu w/ pcp for further management of chronic abdominal pain.    Final Clinical Impression(s) / ED Diagnoses Final diagnoses:  Abdominal pain, unspecified abdominal location    Rx / DC Orders ED Discharge Orders     None         Brazen Domangue, Corene Cornea, MD 04/20/22 [phone]

## 2022-04-20 NOTE — ED Triage Notes (Signed)
Pt states he thinks he is having a pancreatitis flare up, started about 6 days ago and gotten worse  Reports LUQ pain radiating into his back  Denies fever, but states N/V today x 3   Has not taken anything for pain today, states is out of pain meds

## 2022-04-22 ENCOUNTER — Emergency Department (HOSPITAL_BASED_OUTPATIENT_CLINIC_OR_DEPARTMENT_OTHER)
Admission: EM | Admit: 2022-04-22 | Discharge: 2022-04-22 | Disposition: A | Discharge status: Home / Self Care | Payer: No Typology Code available for payment source | Attending: Emergency Medicine | Admitting: Emergency Medicine

## 2022-04-22 ENCOUNTER — Other Ambulatory Visit: Payer: Self-pay

## 2022-04-22 ENCOUNTER — Encounter (HOSPITAL_BASED_OUTPATIENT_CLINIC_OR_DEPARTMENT_OTHER): Payer: Self-pay | Admitting: Emergency Medicine

## 2022-04-22 ENCOUNTER — Emergency Department (HOSPITAL_BASED_OUTPATIENT_CLINIC_OR_DEPARTMENT_OTHER): Payer: No Typology Code available for payment source

## 2022-04-22 DIAGNOSIS — K861 Other chronic pancreatitis: Secondary | ICD-10-CM | POA: Insufficient documentation

## 2022-04-22 DIAGNOSIS — R1013 Epigastric pain: Secondary | ICD-10-CM | POA: Diagnosis present

## 2022-04-22 LAB — CBC WITH DIFFERENTIAL/PLATELET
Abs Immature Granulocytes: 0.08 10*3/uL — ABNORMAL HIGH (ref 0.00–0.07)
Basophils Absolute: 0.1 10*3/uL (ref 0.0–0.1)
Basophils Relative: 1 %
Eosinophils Absolute: 0.4 10*3/uL (ref 0.0–0.5)
Eosinophils Relative: 3 %
HCT: 46.2 % (ref 39.0–52.0)
Hemoglobin: 16.3 g/dL (ref 13.0–17.0)
Immature Granulocytes: 1 %
Lymphocytes Relative: 24 %
Lymphs Abs: 3.2 10*3/uL (ref 0.7–4.0)
MCH: 30.1 pg (ref 26.0–34.0)
MCHC: 35.3 g/dL (ref 30.0–36.0)
MCV: 85.2 fL (ref 80.0–100.0)
Monocytes Absolute: 1.3 10*3/uL — ABNORMAL HIGH (ref 0.1–1.0)
Monocytes Relative: 10 %
Neutro Abs: 8.5 10*3/uL — ABNORMAL HIGH (ref 1.7–7.7)
Neutrophils Relative %: 61 %
Platelets: 227 10*3/uL (ref 150–400)
RBC: 5.42 MIL/uL (ref 4.22–5.81)
RDW: 13.2 % (ref 11.5–15.5)
WBC: 13.5 10*3/uL — ABNORMAL HIGH (ref 4.0–10.5)
nRBC: 0 % (ref 0.0–0.2)

## 2022-04-22 LAB — COMPREHENSIVE METABOLIC PANEL
ALT: 24 U/L (ref 0–44)
AST: 23 U/L (ref 15–41)
Albumin: 4.2 g/dL (ref 3.5–5.0)
Alkaline Phosphatase: 78 U/L (ref 38–126)
Anion gap: 9 (ref 5–15)
BUN: 13 mg/dL (ref 6–20)
CO2: 24 mmol/L (ref 22–32)
Calcium: 9.4 mg/dL (ref 8.9–10.3)
Chloride: 107 mmol/L (ref 98–111)
Creatinine, Ser: 0.95 mg/dL (ref 0.61–1.24)
GFR, Estimated: >60 mL/min (ref >60)
Glucose, Bld: 103 mg/dL — ABNORMAL HIGH (ref 70–99)
Potassium: 3.5 mmol/L (ref 3.5–5.1)
Sodium: 140 mmol/L (ref 135–145)
Total Bilirubin: 0.5 mg/dL (ref 0.3–1.2)
Total Protein: 7.5 g/dL (ref 6.5–8.1)

## 2022-04-22 LAB — LIPASE, BLOOD: Lipase: 34 U/L (ref 11–51)

## 2022-04-22 MED ORDER — PROMETHAZINE HCL 25 MG/ML IJ SOLN
INTRAMUSCULAR | Status: AC
  Filled 2022-04-22: qty 1

## 2022-04-22 MED ORDER — OXYCODONE HCL 5 MG PO TABS: 5.0000 mg | ORAL_TABLET | ORAL | 0 refills | Status: DC | PRN

## 2022-04-22 MED ORDER — HYDROMORPHONE HCL 1 MG/ML IJ SOLN
1.0000 mg | Freq: Once | INTRAMUSCULAR | Status: AC
  Administered 2022-04-22: 1 mg via INTRAVENOUS
  Filled 2022-04-22: qty 1

## 2022-04-22 MED ORDER — SODIUM CHLORIDE 0.9 % IV BOLUS
1000.0000 mL | Freq: Once | INTRAVENOUS | Status: AC
  Administered 2022-04-22: 1000 mL via INTRAVENOUS

## 2022-04-22 MED ORDER — SODIUM CHLORIDE 0.9 % IV SOLN
25.0000 mg | Freq: Once | INTRAVENOUS | Status: AC
  Administered 2022-04-22: 25 mg via INTRAVENOUS
  Filled 2022-04-22: qty 1

## 2022-04-22 MED ORDER — PROMETHAZINE HCL 25 MG PO TABS: 25.0000 mg | ORAL_TABLET | Freq: Four times a day (QID) | ORAL | 0 refills | Status: DC | PRN

## 2022-04-22 NOTE — Discharge Instructions (Addendum)
Take oxicodone for pain  Take Phenergan for nausea  Stay hydrated.    You need to get a pain management doctor  Return to ER if you have worse abdominal pain, vomiting, dehydration

## 2022-04-22 NOTE — ED Notes (Signed)
Pt care taken, complaining of upper abdominal pain that has been going on for a week.

## 2022-04-22 NOTE — ED Triage Notes (Signed)
LUQ abdominal pain and n/v x 1 week. Reports hx of chronic pancreatitis. Flare up x 1 week. Unable to control sx at home.

## 2022-04-22 NOTE — ED Provider Notes (Signed)
[facility]ERGENCY DEPARTMENT Provider Note   CSN: [id number] Arrival date & time: 04/22/22  1730     History  Chief Complaint  Patient presents with   Abdominal Pain    [name] is a 46 y.o. male here presenting with abdominal pain.  Patient has history of chronic pancreatitis.  Patient was seen in the ED 2 days ago for epigastric pain.  Patient had normal lipase at that time.  He was given pain medicine and IV fluids and Phenergan and felt better.  He states that he started vomiting again and has worsening epigastric pain.  Patient had several ED visits since July for pancreatitis.  Patient's last CT scan and MRI was in July of this year.  The history is provided by the patient.       Home Medications Prior to Admission medications   Medication Sig Start Date End Date Taking? Authorizing Provider  amLODipine (NORVASC) 5 MG tablet TAKE 1 TABLET (5 MG TOTAL) BY MOUTH DAILY. 03/05/22   [place], Betty G, MD  benazepril (LOTENSIN) 40 MG tablet TAKE 1 TABLET BY MOUTH EVERY DAY 03/05/22   [place], Betty G, MD  fluticasone (FLONASE) 50 MCG/ACT nasal spray PLACE 1 SPRAY INTO BOTH NOSTRILS 2 (TWO) TIMES DAILY Patient taking differently: Place 1 spray into both nostrils 2 (two) times daily as needed for allergies. 10/30/21   [place], Betty G, MD  hydrOXYzine (ATARAX) 25 MG tablet Take 1 tablet (25 mg total) by mouth 2 (two) times daily as needed for anxiety. 03/20/22   [place], Betty G, MD  KLOR-CON M20 20 MEQ tablet TAKE 1 TABLET BY MOUTH EVERY DAY 02/12/22   [place], Betty G, MD  metoprolol succinate (TOPROL-XL) 100 MG 24 hr tablet TAKE 1 TABLET BY MOUTH EVERY DAY WITH OR IMMEDIATELY FOLLOWING A MEAL 10/30/21   [place], Betty G, MD  mirtazapine (REMERON SOL-TAB) 15 MG disintegrating tablet Take 0.5-1 tablets (7.5-15 mg total) by mouth at bedtime. 03/20/22   [place], Betty G, MD  ondansetron (ZOFRAN-ODT) 4 MG disintegrating tablet Take 1 tablet (4 mg total) by mouth every 8 (eight)  hours as needed for nausea or vomiting. 12/03/21   Tedd Sias, PA  oxyCODONE 10 MG TABS Take 1 tablet (10 mg total) by mouth every 6 (six) hours as needed for breakthrough pain or moderate pain. 12/14/21   Caren Griffins, MD  promethazine (PHENERGAN) 25 MG suppository Place 1 suppository (25 mg total) rectally every 6 (six) hours as needed for nausea or vomiting. 9/89/21   Delora Fuel, MD  rosuvastatin (CRESTOR) 10 MG tablet TAKE 1 TABLET BY MOUTH EVERY DAY 03/26/22   [place], Betty G, MD      Allergies    Cortisone, Eggs or egg-derived products, Ketorolac, Haldol [haloperidol lactate], Hydrocortisone, Iodinated contrast media, Ketorolac tromethamine, Reglan [metoclopramide], Compazine [prochlorperazine], Droperidol, and Ketamine    Review of Systems   Review of Systems  Gastrointestinal:  Positive for abdominal pain.  All other systems reviewed and are negative.   Physical Exam Updated Vital Signs BP (!) 131/91   Pulse 67   Temp 98.7 F (37.1 C) (Oral)   Resp 18   Ht '6\' 1"'$  (1.854 m)   Wt 93.9 kg   SpO2 94%   BMI 27.31 kg/m  Physical Exam Vitals and nursing note reviewed.  Constitutional:      Comments: Uncomfortable  HENT:     Head: Normocephalic.     Mouth/Throat:     Pharynx: Oropharynx is  clear.  Eyes:     Extraocular Movements: Extraocular movements intact.     Pupils: Pupils are equal, round, and reactive to light.  Cardiovascular:     Rate and Rhythm: Normal rate and regular rhythm.     Heart sounds: Normal heart sounds.  Pulmonary:     Effort: Pulmonary effort is normal.     Breath sounds: Normal breath sounds.  Abdominal:     Comments: + Epigastric tenderness  Skin:    General: Skin is warm.     Capillary Refill: Capillary refill takes less than 2 seconds.  Neurological:     General: No focal deficit present.     Mental Status: He is alert and oriented to person, place, and time.  Psychiatric:        Mood and Affect: Mood normal.        Behavior:  Behavior normal.     ED Results / Procedures / Treatments   Labs (all labs ordered are listed, but only abnormal results are displayed) Labs Reviewed  CBC WITH DIFFERENTIAL/PLATELET - Abnormal; Notable for the following components:      Result Value   WBC 13.5 (*)    Neutro Abs 8.5 (*)    Monocytes Absolute 1.3 (*)    Abs Immature Granulocytes 0.08 (*)    All other components within normal limits  COMPREHENSIVE METABOLIC PANEL - Abnormal; Notable for the following components:   Glucose, Bld 103 (*)    All other components within normal limits  LIPASE, BLOOD    EKG None  Radiology CT ABDOMEN PELVIS WO CONTRAST  Result Date: 04/22/2022 CLINICAL DATA:  Left upper quadrant pain, nausea, vomiting EXAM: CT ABDOMEN AND PELVIS WITHOUT CONTRAST TECHNIQUE: Multidetector CT imaging of the abdomen and pelvis was performed following the standard protocol without IV contrast. RADIATION DOSE REDUCTION: This exam was performed according to the departmental dose-optimization program which includes automated exposure control, adjustment of the mA and/or kV according to patient size and/or use of iterative reconstruction technique. COMPARISON:  12/03/2021 FINDINGS: Lower chest: No pleural or pericardial effusion. Visualized lung bases clear. Hepatobiliary: Post cholecystectomy. Persistent pneumobilia suggesting previous sphincterotomy, with some decrease in the central biliary ductal dilatation. No focal liver lesion. Pancreas: Unremarkable. No pancreatic ductal dilatation or surrounding inflammatory changes. Spleen: Normal in size without focal abnormality. Adrenals/Urinary Tract: Adrenal glands are unremarkable. Kidneys are normal, without renal calculi, focal lesion, or hydronephrosis. Bladder is unremarkable. Stomach/Bowel: Stomach is partially distended, unremarkable. Small bowel decompressed. Surgical clip at the base of the cecum. The colon is nondilated , unremarkable. Vascular/Lymphatic: Mild  calcified aortic atheromatous plaque. No abdominal or pelvic adenopathy. Reproductive: Prostate enlargement with coarse central calcifications. Other: Bilateral pelvic phleboliths.  No ascites.  No free air. Musculoskeletal: No acute or significant osseous findings. IMPRESSION: 1. No acute findings. 2. Persistent pneumobilia suggesting previous sphincterotomy, with some decrease in the central biliary ductal dilatation. Aortic Atherosclerosis (ICD10-I70.0). Electronically Signed   By: Lucrezia Europe M.D.   On: 04/22/2022 19:46    Procedures Procedures    Medications Ordered in ED Medications  promethazine (PHENERGAN) 25 MG/ML injection (  Not Given 04/22/22 1942)  sodium chloride 0.9 % bolus 1,000 mL (1,000 mLs Intravenous New Bag/Given 04/22/22 1931)  HYDROmorphone (DILAUDID) injection 1 mg (1 mg Intravenous Given 04/22/22 1938)  promethazine (PHENERGAN) 25 mg in sodium chloride 0.9 % 50 mL IVPB (25 mg Intravenous New Bag/Given 04/22/22 1941)    ED Course/ Medical Decision Making/ A&P  Medical Decision Making [name] is a 46 y.o. male here presenting with epigastric pain.  This is a recurrent problem for him.  He was just seen here 2 days ago for pancreatitis and had normal lipase.  Will repeat his CBC CMP and lipase.  Since his last imaging study was over 3 months ago, we will get CT abdomen pelvis to assess for pseudocyst.  Will give pain medicine and Phenergan and reassess.  9:18 PM I reviewed patient's labs and independently interpreted CT scan.  Patient CBC CMP and lipase are normal.  CT showed persistent pneumobilia that is unchanged.  Patient was given Phenergan and Dilaudid and felt better.  Patient actually does not have a pain management doctor right now.  I checked the controlled substance database and he filled only several pills of oxycodone about a week ago.  He is trying to get into pain management and I will prescribe short course of pain medicine  for now.  Problems Addressed: Chronic pancreatitis, unspecified pancreatitis type [facility]): chronic illness or injury  Amount and/or Complexity of Data Reviewed Labs: ordered. Decision-making details documented in ED Course. Radiology: ordered and independent interpretation performed. Decision-making details documented in ED Course. ECG/medicine tests: ordered.  Risk Prescription drug management.    Final Clinical Impression(s) / ED Diagnoses Final diagnoses:  None    Rx / DC Orders ED Discharge Orders     None         Drenda Freeze, MD 04/22/22 2119

## 2022-05-01 ENCOUNTER — Other Ambulatory Visit: Payer: Self-pay | Admitting: Family Medicine

## 2022-05-01 DIAGNOSIS — I10 Essential (primary) hypertension: Secondary | ICD-10-CM

## 2022-05-01 DIAGNOSIS — J309 Allergic rhinitis, unspecified: Secondary | ICD-10-CM

## 2022-05-10 ENCOUNTER — Encounter (HOSPITAL_BASED_OUTPATIENT_CLINIC_OR_DEPARTMENT_OTHER): Payer: Self-pay

## 2022-05-10 ENCOUNTER — Other Ambulatory Visit: Payer: Self-pay

## 2022-05-10 ENCOUNTER — Emergency Department (HOSPITAL_BASED_OUTPATIENT_CLINIC_OR_DEPARTMENT_OTHER)
Admission: EM | Admit: 2022-05-10 | Discharge: 2022-05-10 | Disposition: A | Discharge status: Home / Self Care | Payer: No Typology Code available for payment source | Attending: Emergency Medicine | Admitting: Emergency Medicine

## 2022-05-10 DIAGNOSIS — R109 Unspecified abdominal pain: Secondary | ICD-10-CM

## 2022-05-10 DIAGNOSIS — R112 Nausea with vomiting, unspecified: Secondary | ICD-10-CM | POA: Diagnosis not present

## 2022-05-10 DIAGNOSIS — E876 Hypokalemia: Secondary | ICD-10-CM | POA: Insufficient documentation

## 2022-05-10 DIAGNOSIS — R1013 Epigastric pain: Secondary | ICD-10-CM | POA: Diagnosis not present

## 2022-05-10 DIAGNOSIS — D72829 Elevated white blood cell count, unspecified: Secondary | ICD-10-CM | POA: Insufficient documentation

## 2022-05-10 DIAGNOSIS — R1010 Upper abdominal pain, unspecified: Secondary | ICD-10-CM | POA: Diagnosis present

## 2022-05-10 LAB — CBC WITH DIFFERENTIAL/PLATELET
Abs Immature Granulocytes: 0.08 10*3/uL — ABNORMAL HIGH (ref 0.00–0.07)
Basophils Absolute: 0.1 10*3/uL (ref 0.0–0.1)
Basophils Relative: 1 %
Eosinophils Absolute: 0.2 10*3/uL (ref 0.0–0.5)
Eosinophils Relative: 2 %
HCT: 45.1 % (ref 39.0–52.0)
Hemoglobin: 16 g/dL (ref 13.0–17.0)
Immature Granulocytes: 1 %
Lymphocytes Relative: 25 %
Lymphs Abs: 2.9 10*3/uL (ref 0.7–4.0)
MCH: 30.2 pg (ref 26.0–34.0)
MCHC: 35.5 g/dL (ref 30.0–36.0)
MCV: 85.3 fL (ref 80.0–100.0)
Monocytes Absolute: 1.1 10*3/uL — ABNORMAL HIGH (ref 0.1–1.0)
Monocytes Relative: 9 %
Neutro Abs: 7.2 10*3/uL (ref 1.7–7.7)
Neutrophils Relative %: 62 %
Platelets: 238 10*3/uL (ref 150–400)
RBC: 5.29 MIL/uL (ref 4.22–5.81)
RDW: 12.6 % (ref 11.5–15.5)
WBC: 11.5 10*3/uL — ABNORMAL HIGH (ref 4.0–10.5)
nRBC: 0 % (ref 0.0–0.2)

## 2022-05-10 LAB — COMPREHENSIVE METABOLIC PANEL
ALT: 23 U/L (ref 0–44)
AST: 26 U/L (ref 15–41)
Albumin: 4.4 g/dL (ref 3.5–5.0)
Alkaline Phosphatase: 72 U/L (ref 38–126)
Anion gap: 7 (ref 5–15)
BUN: 11 mg/dL (ref 6–20)
CO2: 24 mmol/L (ref 22–32)
Calcium: 9.3 mg/dL (ref 8.9–10.3)
Chloride: 108 mmol/L (ref 98–111)
Creatinine, Ser: 0.8 mg/dL (ref 0.61–1.24)
GFR, Estimated: >60 mL/min (ref >60)
Glucose, Bld: 104 mg/dL — ABNORMAL HIGH (ref 70–99)
Potassium: 3.2 mmol/L — ABNORMAL LOW (ref 3.5–5.1)
Sodium: 139 mmol/L (ref 135–145)
Total Bilirubin: 0.6 mg/dL (ref 0.3–1.2)
Total Protein: 7.6 g/dL (ref 6.5–8.1)

## 2022-05-10 LAB — LIPASE, BLOOD: Lipase: 37 U/L (ref 11–51)

## 2022-05-10 MED ORDER — HYDROMORPHONE HCL 1 MG/ML IJ SOLN
1.0000 mg | Freq: Once | INTRAMUSCULAR | Status: AC
  Administered 2022-05-10: 1 mg via INTRAVENOUS
  Filled 2022-05-10: qty 1

## 2022-05-10 MED ORDER — SODIUM CHLORIDE 0.9 % IV BOLUS
1000.0000 mL | Freq: Once | INTRAVENOUS | Status: AC
  Administered 2022-05-10: 1000 mL via INTRAVENOUS

## 2022-05-10 MED ORDER — LORAZEPAM 2 MG/ML IJ SOLN
1.0000 mg | Freq: Once | INTRAMUSCULAR | Status: AC
  Administered 2022-05-10: 1 mg via INTRAVENOUS
  Filled 2022-05-10: qty 1

## 2022-05-10 NOTE — Discharge Instructions (Signed)
Return to the ED if the pain becomes different or worse.  Follow-up with your main doctor for reevaluation later this week.  Follow-up with pain management in January as planned.

## 2022-05-10 NOTE — ED Triage Notes (Signed)
N/V and abd pain x3 days. Pt with hx of pancreatitis and thinks it is a flare-up.

## 2022-05-10 NOTE — ED Provider Notes (Signed)
[facility] Provider Note   CSN: [id number] Arrival date & time: 05/10/22  2108     History  Chief Complaint  Patient presents with   Abdominal Pain    [name] is a 46 y.o. male.   Abdominal Pain    Patient presents due to upper abdominal pain.  Started 3 days ago, associate with nausea and vomiting.  Worsened 6 hours ago.  No chest pain or shortness of breath.  Home Medications Prior to Admission medications   Medication Sig Start Date End Date Taking? Authorizing Provider  amLODipine (NORVASC) 5 MG tablet TAKE 1 TABLET (5 MG TOTAL) BY MOUTH DAILY. 03/05/22   [place], Betty G, MD  benazepril (LOTENSIN) 40 MG tablet TAKE 1 TABLET BY MOUTH EVERY DAY 03/05/22   [place], Betty G, MD  fluticasone [facility]) 50 MCG/ACT nasal spray PLACE 1 SPRAY INTO BOTH NOSTRILS 2 (TWO) TIMES DAILY 05/01/22   [place], Betty G, MD  hydrOXYzine (ATARAX) 25 MG tablet Take 1 tablet (25 mg total) by mouth 2 (two) times daily as needed for anxiety. 03/20/22   [place], Betty G, MD  KLOR-CON M20 20 MEQ tablet TAKE 1 TABLET BY MOUTH EVERY DAY 02/12/22   [place], Betty G, MD  metoprolol succinate (TOPROL-XL) 100 MG 24 hr tablet TAKE 1 TABLET BY MOUTH EVERY DAY WITH OR IMMEDIATELY FOLLOWING A MEAL 05/01/22   [place], Betty G, MD  mirtazapine (REMERON SOL-TAB) 15 MG disintegrating tablet Take 0.5-1 tablets (7.5-15 mg total) by mouth at bedtime. 03/20/22   [place], Betty G, MD  ondansetron (ZOFRAN-ODT) 4 MG disintegrating tablet Take 1 tablet (4 mg total) by mouth every 8 (eight) hours as needed for nausea or vomiting. 12/03/21   Tedd Sias, PA  oxyCODONE (ROXICODONE) 5 MG immediate release tablet Take 1 tablet (5 mg total) by mouth every 4 (four) hours as needed for severe pain. 04/22/22   Drenda Freeze, MD  promethazine (PHENERGAN) 25 MG tablet Take 1 tablet (25 mg total) by mouth every 6 (six) hours as needed for nausea or vomiting. 04/22/22   Drenda Freeze, MD   rosuvastatin (CRESTOR) 10 MG tablet TAKE 1 TABLET BY MOUTH EVERY DAY 03/26/22   [place], Betty G, MD      Allergies    Cortisone, Eggs or egg-derived products, Ketorolac, Haldol [haloperidol lactate], Hydrocortisone, Iodinated contrast media, Ketorolac tromethamine, Reglan [metoclopramide], Compazine [prochlorperazine], Droperidol, and Ketamine    Review of Systems   Review of Systems  Gastrointestinal:  Positive for abdominal pain.    Physical Exam Updated Vital Signs BP (!) 150/105   Pulse 72   Temp 98.7 F (37.1 C) (Oral)   Resp 18   Ht '6\' 1"'$  (1.854 m)   Wt 95.3 kg   SpO2 94%   BMI 27.71 kg/m  Physical Exam Vitals and nursing note reviewed. Exam conducted with a chaperone present.  Constitutional:      Appearance: Normal appearance.  HENT:     Head: Normocephalic and atraumatic.  Eyes:     General: No scleral icterus.       Right eye: No discharge.        Left eye: No discharge.     Extraocular Movements: Extraocular movements intact.     Pupils: Pupils are equal, round, and reactive to light.  Cardiovascular:     Rate and Rhythm: Normal rate and regular rhythm.     Pulses: Normal pulses.     Heart sounds: Normal heart sounds. No  murmur heard.    No friction rub. No gallop.  Pulmonary:     Effort: Pulmonary effort is normal. No respiratory distress.     Breath sounds: Normal breath sounds.  Abdominal:     General: Abdomen is flat. Bowel sounds are normal. There is no distension.     Palpations: Abdomen is soft.     Tenderness: There is abdominal tenderness in the epigastric area.  Skin:    General: Skin is warm and dry.     Coloration: Skin is not jaundiced.  Neurological:     Mental Status: He is alert. Mental status is at baseline.     Coordination: Coordination normal.     ED Results / Procedures / Treatments   Labs (all labs ordered are listed, but only abnormal results are displayed) Labs Reviewed  CBC WITH DIFFERENTIAL/PLATELET - Abnormal; Notable  for the following components:      Result Value   WBC 11.5 (*)    Monocytes Absolute 1.1 (*)    Abs Immature Granulocytes 0.08 (*)    All other components within normal limits  COMPREHENSIVE METABOLIC PANEL - Abnormal; Notable for the following components:   Potassium 3.2 (*)    Glucose, Bld 104 (*)    All other components within normal limits  LIPASE, BLOOD    EKG EKG Interpretation  Date/Time:  Thursday May 10 2022 21:51:53 EST Ventricular Rate:  67 PR Interval:  164 QRS Duration: 98 QT Interval:  424 QTC Calculation: 448 R Axis:   56 Text Interpretation: Sinus rhythm No significant change since last tracing Confirmed by Wandra Arthurs [phone]) on 05/10/2022 9:54:05 PM  Radiology No results found.  Procedures Procedures    Medications Ordered in ED Medications  sodium chloride 0.9 % bolus 1,000 mL (0 mLs Intravenous Stopped 05/10/22 2229)  HYDROmorphone (DILAUDID) injection 1 mg (1 mg Intravenous Given 05/10/22 2154)  LORazepam (ATIVAN) injection 1 mg (1 mg Intravenous Given 05/10/22 2153)  HYDROmorphone (DILAUDID) injection 1 mg (1 mg Intravenous Given 05/10/22 2234)    ED Course/ Medical Decision Making/ A&P                           Medical Decision Making Amount and/or Complexity of Data Reviewed Labs: ordered.  Risk Prescription drug management.   Patient presents due to upper abdominal pain.  Differential  He has a history of chronic pancreatitis and states this feels very similar to previous flareup.  On exam he does have epigastric tenderness, there is no rigidity or guarding.  Abdomen is overall soft, he has a regular rate and rhythm without any tachycardia.  Reviewed external medical records, also saw patient's most recent ED visit where he had a CT scan on 04/22/2022.  CT scan at that time was not particularly remarkable.  I ordered, viewed and interpreted laboratory workup. CBC with slight leukocytosis with a white count of 11.5 but no left  shift. Lipase within normal limits. CMP with slight hypokalemia with a potassium of 3.2.  No transaminitis or AKI.  I ordered 1 L normal saline, 2 mg of Dilaudid.  EKG is with sinus rhythm.  No ischemic changes.  Patient is on cardiac monitoring in sinus rhythm.  On reassessment patient's pain is improved.  Stable vitals, repeat abdominal exam is benign.  I do not think there is indication for repeat CT.  He has pain management clinic coming up in January. Precaution discussed with patient and his significant  other in agreement with the plan.        Final Clinical Impression(s) / ED Diagnoses Final diagnoses:  Abdominal pain, unspecified abdominal location    Rx / DC Orders ED Discharge Orders     None         Sherrill Raring, Hershal Coria 05/10/22 2239    Drenda Freeze, MD 05/10/22 [phone]

## 2022-05-12 ENCOUNTER — Encounter (HOSPITAL_BASED_OUTPATIENT_CLINIC_OR_DEPARTMENT_OTHER): Payer: Self-pay | Admitting: Emergency Medicine

## 2022-05-12 ENCOUNTER — Emergency Department (HOSPITAL_BASED_OUTPATIENT_CLINIC_OR_DEPARTMENT_OTHER)
Admission: EM | Admit: 2022-05-12 | Discharge: 2022-05-12 | Disposition: A | Discharge status: Home / Self Care | Payer: No Typology Code available for payment source | Attending: Emergency Medicine | Admitting: Emergency Medicine

## 2022-05-12 ENCOUNTER — Other Ambulatory Visit: Payer: Self-pay

## 2022-05-12 DIAGNOSIS — F17210 Nicotine dependence, cigarettes, uncomplicated: Secondary | ICD-10-CM | POA: Insufficient documentation

## 2022-05-12 DIAGNOSIS — I10 Essential (primary) hypertension: Secondary | ICD-10-CM | POA: Diagnosis not present

## 2022-05-12 DIAGNOSIS — R1013 Epigastric pain: Secondary | ICD-10-CM | POA: Diagnosis present

## 2022-05-12 LAB — CBC
HCT: 40.2 % (ref 39.0–52.0)
Hemoglobin: 14.2 g/dL (ref 13.0–17.0)
MCH: 30.2 pg (ref 26.0–34.0)
MCHC: 35.3 g/dL (ref 30.0–36.0)
MCV: 85.5 fL (ref 80.0–100.0)
Platelets: 220 10*3/uL (ref 150–400)
RBC: 4.7 MIL/uL (ref 4.22–5.81)
RDW: 12.6 % (ref 11.5–15.5)
WBC: 9.4 10*3/uL (ref 4.0–10.5)
nRBC: 0 % (ref 0.0–0.2)

## 2022-05-12 LAB — COMPREHENSIVE METABOLIC PANEL
ALT: 22 U/L (ref 0–44)
AST: 22 U/L (ref 15–41)
Albumin: 4 g/dL (ref 3.5–5.0)
Alkaline Phosphatase: 74 U/L (ref 38–126)
Anion gap: 6 (ref 5–15)
BUN: 16 mg/dL (ref 6–20)
CO2: 24 mmol/L (ref 22–32)
Calcium: 8.9 mg/dL (ref 8.9–10.3)
Chloride: 107 mmol/L (ref 98–111)
Creatinine, Ser: 0.77 mg/dL (ref 0.61–1.24)
GFR, Estimated: >60 mL/min (ref >60)
Glucose, Bld: 91 mg/dL (ref 70–99)
Potassium: 3.3 mmol/L — ABNORMAL LOW (ref 3.5–5.1)
Sodium: 137 mmol/L (ref 135–145)
Total Bilirubin: 0.4 mg/dL (ref 0.3–1.2)
Total Protein: 6.8 g/dL (ref 6.5–8.1)

## 2022-05-12 LAB — LIPASE, BLOOD: Lipase: 36 U/L (ref 11–51)

## 2022-05-12 MED ORDER — HYDROMORPHONE HCL 1 MG/ML IJ SOLN: 1.0000 mg | Freq: Once | INTRAMUSCULAR | Status: DC

## 2022-05-12 MED ORDER — ONDANSETRON HCL 4 MG/2ML IJ SOLN
4.0000 mg | Freq: Once | INTRAMUSCULAR | Status: AC
  Administered 2022-05-12: 4 mg via INTRAVENOUS
  Filled 2022-05-12: qty 2

## 2022-05-12 MED ORDER — OXYCODONE HCL 5 MG PO TABS: 5.0000 mg | ORAL_TABLET | ORAL | 0 refills | Status: DC | PRN

## 2022-05-12 MED ORDER — HYDROMORPHONE HCL 1 MG/ML IJ SOLN
2.0000 mg | Freq: Once | INTRAMUSCULAR | Status: AC
  Administered 2022-05-12: 2 mg via INTRAVENOUS
  Filled 2022-05-12: qty 2

## 2022-05-12 MED ORDER — LACTATED RINGERS IV BOLUS
1000.0000 mL | Freq: Once | INTRAVENOUS | Status: AC
  Administered 2022-05-12: 1000 mL via INTRAVENOUS

## 2022-05-12 NOTE — ED Provider Notes (Signed)
[facility] Emergency Department Provider Note MRN:  [id number]  Arrival date & time: 05/12/22     Chief Complaint   Abdominal Pain   History of Present Illness   [name] is a 46 y.o. year-old male with a history of pancreatitis, chronic abdominal pain presenting to the ED with chief complaint of abdominal pain.  Severe epigastric abdominal pain for the past 4 days, vomiting any food that HSD.  No fever.  Review of Systems  A thorough review of systems was obtained and all systems are negative except as noted in the HPI and PMH.   Patient's Health History    Past Medical History:  Diagnosis Date   Chronic abdominal pain    Diverticulitis    Drug-seeking behavior    Hypertension    Kidney stones    Pancreatitis, chronic ([place]) 05/28/2005    Past Surgical History:  Procedure Laterality Date   APPENDECTOMY     CHOLECYSTECTOMY     ERCP N/A 12/30/2019   Procedure: ENDOSCOPIC RETROGRADE CHOLANGIOPANCREATOGRAPHY (ERCP);  Surgeon: Milus Banister, MD;  Location: [facility];  Service: Endoscopy;  Laterality: N/A;   ERCP N/A 03/04/2021   Procedure: ENDOSCOPIC RETROGRADE CHOLANGIOPANCREATOGRAPHY (ERCP);  Surgeon: Carol Ada, MD;  Location: [facility];  Service: Endoscopy;  Laterality: N/A;   ERCP N/A 12/13/2021   Procedure: ENDOSCOPIC RETROGRADE CHOLANGIOPANCREATOGRAPHY (ERCP);  Surgeon: Ladene Artist, MD;  Location: [facility];  Service: Gastroenterology;  Laterality: N/A;   ERCP W/ METAL STENT PLACEMENT     KIDNEY STONE SURGERY     REMOVAL OF STONES  12/30/2019   Procedure: REMOVAL OF STONES;  Surgeon: Milus Banister, MD;  Location: [facility];  Service: Endoscopy;;   REMOVAL OF STONES  03/04/2021   Procedure: REMOVAL OF STONES;  Surgeon: Carol Ada, MD;  Location: [facility];  Service: Endoscopy;;   REMOVAL OF STONES  12/13/2021   Procedure: REMOVAL OF STONES;  Surgeon: Ladene Artist, MD;  Location: [facility];  Service:  Gastroenterology;;   Joan Mayans  03/04/2021   Procedure: Joan Mayans;  Surgeon: Carol Ada, MD;  Location: [facility];  Service: Endoscopy;;    Family History  Problem Relation Age of Onset   Breast cancer Mother    Early death Mother    Hypertension Mother    Heart failure Father    Hypertension Father    Heart disease Father    Hyperlipidemia Father    Pancreatic cancer Paternal Grandmother        possibly   Pancreatic cancer Paternal Aunt    Colon cancer Neg Hx     Social History   Socioeconomic History   Marital status: Married    Spouse name: Not on file   Number of children: 1   Years of education: Not on file   Highest education level: Some college, no degree  Occupational History   Not on file  Tobacco Use   Smoking status: Every Day    Packs/day: 0.50    Types: Cigarettes   Smokeless tobacco: Never  Vaping Use   Vaping Use: Never used  Substance and Sexual Activity   Alcohol use: Not Currently   Drug use: No   Sexual activity: Not on file  Other Topics Concern   Not on file  Social History Narrative   Not on file   Social Determinants of Health   Financial Resource Strain: Low Risk  (04/03/2021)   Overall Financial Resource Strain (CARDIA)    Difficulty of  Paying Living Expenses: Not hard at all  Food Insecurity: No Food Insecurity (04/03/2021)   Hunger Vital Sign    Worried About Running Out of Food in the Last Year: Never true    Ran Out of Food in the Last Year: Never true  Transportation Needs: No Transportation Needs (04/03/2021)   PRAPARE - Hydrologist (Medical): No    Lack of Transportation (Non-Medical): No  Physical Activity: Insufficiently Active (04/03/2021)   Exercise Vital Sign    Days of Exercise per Week: 1 day    Minutes of Exercise per Session: 20 min  Stress: No Stress Concern Present (04/03/2021)   [place]    Feeling of  Stress : Only a little  Social Connections: Socially Integrated (04/03/2021)   Social Connection and Isolation Panel [NHANES]    Frequency of Communication with Friends and Family: Three times a week    Frequency of Social Gatherings with Friends and Family: Once a week    Attends Religious Services: More than 4 times per year    Active Member of Clubs or Organizations: Yes    Attends Music therapist: More than 4 times per year    Marital Status: Married  Human resources officer Violence: Not on file     Physical Exam   Vitals:   05/12/22 0442 05/12/22 0600  BP: (!) 148/103 123/87  Pulse: 73 (!) 56  Resp: 16 16  Temp: 98.2 F (36.8 C)   SpO2: 97% 94%    CONSTITUTIONAL: Well-appearing, NAD NEURO/PSYCH:  Alert and oriented x 3, no focal deficits EYES:  eyes equal and reactive ENT/NECK:  no LAD, no JVD CARDIO: Regular rate, well-perfused, normal S1 and S2 PULM:  CTAB no wheezing or rhonchi GI/GU:  non-distended, non-tender MSK/SPINE:  No gross deformities, no edema SKIN:  no rash, atraumatic   *Additional and/or pertinent findings included in MDM below  Diagnostic and Interventional Summary    EKG Interpretation  Date/Time:    Ventricular Rate:    PR Interval:    QRS Duration:   QT Interval:    QTC Calculation:   R Axis:     Text Interpretation:         Labs Reviewed  COMPREHENSIVE METABOLIC PANEL - Abnormal; Notable for the following components:      Result Value   Potassium 3.3 (*)    All other components within normal limits  CBC  LIPASE, BLOOD    No orders to display    Medications  lactated ringers bolus 1,000 mL (0 mLs Intravenous Stopped 05/12/22 0617)  ondansetron (ZOFRAN) injection 4 mg (4 mg Intravenous Given 05/12/22 0519)  HYDROmorphone (DILAUDID) injection 2 mg (2 mg Intravenous Given 05/12/22 0519)     Procedures  /  Critical Care Procedures  ED Course and Medical Decision Making  Initial Impression and Ddx Acute on chronic  pain, pancreatitis history.  Will recheck labs.  He is a frequent visitor recently, currently I do not see indication to repeat CT scan, had one a few weeks ago.  Past medical/surgical history that increases complexity of ED encounter: Chronic abdominal pain, chronic pancreatitis  Interpretation of Diagnostics I personally reviewed the laboratory assessment and my interpretation is as follows: No significant blood count or electrolyte disturbance    Patient Reassessment and Ultimate Disposition/Management     Patient feeling much better, interested in going home.  He has follow-up with pain management in the middle  of next month.  Patient management required discussion with the following services or consulting groups:  None  Complexity of Problems Addressed Acute illness or injury that poses threat of life of bodily function  Additional Data Reviewed and Analyzed Further history obtained from: Further history from spouse/family member  Additional Factors Impacting ED Encounter Risk Prescriptions  Barth Kirks. [place]onia Small, [place] [email or web address]  Final Clinical Impressions(s) / ED Diagnoses     ICD-10-CM   1. Epigastric pain  R10.13       ED Discharge Orders          Ordered    oxyCODONE (ROXICODONE) 5 MG immediate release tablet  Every 4 hours PRN        05/12/22 8850             Discharge Instructions Discussed with and Provided to Patient:     Discharge Instructions      You were evaluated in the Emergency Department and after careful evaluation, we did not find any emergent condition requiring admission or further testing in the hospital.  Your exam/testing today was overall reassuring.  Keep your follow-up with pain management.   Please return to the Emergency Department if you experience any worsening of your condition.  Thank you for allowing [place] to be a part of your care.        Maudie Flakes,  MD 05/12/22 [phone]

## 2022-05-12 NOTE — Discharge Instructions (Signed)
You were evaluated in the Emergency Department and after careful evaluation, we did not find any emergent condition requiring admission or further testing in the hospital.  Your exam/testing today was overall reassuring.  Keep your follow-up with pain management.   Please return to the Emergency Department if you experience any worsening of your condition.  Thank you for allowing [place] to be a part of your care.

## 2022-05-12 NOTE — ED Triage Notes (Signed)
Pt is having a pancreatitis flare up for the past 4 days  Pt states when he eats he vomits  Pt is c/o upper mid abdomen that radiates to his back

## 2022-05-14 ENCOUNTER — Other Ambulatory Visit: Payer: Self-pay

## 2022-05-14 ENCOUNTER — Emergency Department (HOSPITAL_BASED_OUTPATIENT_CLINIC_OR_DEPARTMENT_OTHER)
Admission: EM | Admit: 2022-05-14 | Discharge: 2022-05-14 | Disposition: A | Discharge status: Home / Self Care | Payer: No Typology Code available for payment source | Attending: Emergency Medicine | Admitting: Emergency Medicine

## 2022-05-14 DIAGNOSIS — R1013 Epigastric pain: Secondary | ICD-10-CM | POA: Diagnosis present

## 2022-05-14 DIAGNOSIS — Z79899 Other long term (current) drug therapy: Secondary | ICD-10-CM | POA: Diagnosis not present

## 2022-05-14 DIAGNOSIS — F17210 Nicotine dependence, cigarettes, uncomplicated: Secondary | ICD-10-CM | POA: Insufficient documentation

## 2022-05-14 DIAGNOSIS — G8929 Other chronic pain: Secondary | ICD-10-CM | POA: Insufficient documentation

## 2022-05-14 DIAGNOSIS — I10 Essential (primary) hypertension: Secondary | ICD-10-CM | POA: Diagnosis not present

## 2022-05-14 DIAGNOSIS — R112 Nausea with vomiting, unspecified: Secondary | ICD-10-CM | POA: Insufficient documentation

## 2022-05-14 LAB — CBC WITH DIFFERENTIAL/PLATELET
Abs Immature Granulocytes: 0.04 10*3/uL (ref 0.00–0.07)
Basophils Absolute: 0.1 10*3/uL (ref 0.0–0.1)
Basophils Relative: 1 %
Eosinophils Absolute: 0.3 10*3/uL (ref 0.0–0.5)
Eosinophils Relative: 3 %
HCT: 40.1 % (ref 39.0–52.0)
Hemoglobin: 14.2 g/dL (ref 13.0–17.0)
Immature Granulocytes: 0 %
Lymphocytes Relative: 31 %
Lymphs Abs: 2.9 10*3/uL (ref 0.7–4.0)
MCH: 30.5 pg (ref 26.0–34.0)
MCHC: 35.4 g/dL (ref 30.0–36.0)
MCV: 86.1 fL (ref 80.0–100.0)
Monocytes Absolute: 1 10*3/uL (ref 0.1–1.0)
Monocytes Relative: 11 %
Neutro Abs: 5.1 10*3/uL (ref 1.7–7.7)
Neutrophils Relative %: 54 %
Platelets: 215 10*3/uL (ref 150–400)
RBC: 4.66 MIL/uL (ref 4.22–5.81)
RDW: 12.5 % (ref 11.5–15.5)
WBC: 9.3 10*3/uL (ref 4.0–10.5)
nRBC: 0 % (ref 0.0–0.2)

## 2022-05-14 LAB — COMPREHENSIVE METABOLIC PANEL
ALT: 19 U/L (ref 0–44)
AST: 20 U/L (ref 15–41)
Albumin: 4 g/dL (ref 3.5–5.0)
Alkaline Phosphatase: 73 U/L (ref 38–126)
Anion gap: 6 (ref 5–15)
BUN: 19 mg/dL (ref 6–20)
CO2: 29 mmol/L (ref 22–32)
Calcium: 9.3 mg/dL (ref 8.9–10.3)
Chloride: 107 mmol/L (ref 98–111)
Creatinine, Ser: 1.03 mg/dL (ref 0.61–1.24)
GFR, Estimated: >60 mL/min (ref >60)
Glucose, Bld: 97 mg/dL (ref 70–99)
Potassium: 3.5 mmol/L (ref 3.5–5.1)
Sodium: 142 mmol/L (ref 135–145)
Total Bilirubin: 0.5 mg/dL (ref 0.3–1.2)
Total Protein: 7 g/dL (ref 6.5–8.1)

## 2022-05-14 LAB — LIPASE, BLOOD: Lipase: 37 U/L (ref 11–51)

## 2022-05-14 MED ORDER — SODIUM CHLORIDE 0.9 % IV SOLN
25.0000 mg | Freq: Once | INTRAVENOUS | Status: AC
  Administered 2022-05-14: 25 mg via INTRAVENOUS
  Filled 2022-05-14: qty 1

## 2022-05-14 MED ORDER — HYDROMORPHONE HCL 1 MG/ML IJ SOLN
2.0000 mg | Freq: Once | INTRAMUSCULAR | Status: AC
  Administered 2022-05-14: 2 mg via INTRAVENOUS
  Filled 2022-05-14: qty 2

## 2022-05-14 MED ORDER — PROMETHAZINE HCL 25 MG/ML IJ SOLN
INTRAMUSCULAR | Status: AC
  Filled 2022-05-14: qty 1

## 2022-05-14 MED ORDER — SODIUM CHLORIDE 0.9 % IV BOLUS
1000.0000 mL | Freq: Once | INTRAVENOUS | Status: AC
  Administered 2022-05-14: 1000 mL via INTRAVENOUS

## 2022-05-14 MED ORDER — OXYCODONE-ACETAMINOPHEN 5-325 MG PO TABS
2.0000 | ORAL_TABLET | Freq: Once | ORAL | Status: AC
  Administered 2022-05-14: 2 via ORAL
  Filled 2022-05-14: qty 2

## 2022-05-14 MED ORDER — PROMETHAZINE HCL 25 MG PO TABS: 25.0000 mg | ORAL_TABLET | Freq: Four times a day (QID) | ORAL | 0 refills | Status: AC | PRN

## 2022-05-14 NOTE — ED Provider Notes (Signed)
[facility] [facility] DEPT [facility] Provider Note: Georgena Spurling, MD, FACEP  CSN: [id number] MRN: [id number] ARRIVAL: 05/14/22 at [place]: [place]  Abdominal Pain   HISTORY OF PRESENT ILLNESS  05/14/22 1:46 AM [name] is a 46 y.o. male with chronic epigastric pain due to chronic pancreatitis multiple visits to the ED for same.  He was seen most recently on 05/12/2022.  He states he has had persistent nausea and vomiting and pain since then.  The vomiting is preventing him from taking his home antiemetics.  He states he is not currently on home narcotics but has an appointment with a new pain management doctor next month.  He rates his pain as a 7 out of 10.  It is constant and sharp in nature.    Past Medical History:  Diagnosis Date   Chronic abdominal pain    Diverticulitis    Drug-seeking behavior    Hypertension    Kidney stones    Pancreatitis, chronic ([place]) 05/28/2005    Past Surgical History:  Procedure Laterality Date   APPENDECTOMY     CHOLECYSTECTOMY     ERCP N/A 12/30/2019   Procedure: ENDOSCOPIC RETROGRADE CHOLANGIOPANCREATOGRAPHY (ERCP);  Surgeon: Milus Banister, MD;  Location: [facility];  Service: Endoscopy;  Laterality: N/A;   ERCP N/A 03/04/2021   Procedure: ENDOSCOPIC RETROGRADE CHOLANGIOPANCREATOGRAPHY (ERCP);  Surgeon: Carol Ada, MD;  Location: [facility];  Service: Endoscopy;  Laterality: N/A;   ERCP N/A 12/13/2021   Procedure: ENDOSCOPIC RETROGRADE CHOLANGIOPANCREATOGRAPHY (ERCP);  Surgeon: Ladene Artist, MD;  Location: [facility];  Service: Gastroenterology;  Laterality: N/A;   ERCP W/ METAL STENT PLACEMENT     KIDNEY STONE SURGERY     REMOVAL OF STONES  12/30/2019   Procedure: REMOVAL OF STONES;  Surgeon: Milus Banister, MD;  Location: [facility];  Service: Endoscopy;;   REMOVAL OF STONES  03/04/2021   Procedure: REMOVAL OF STONES;  Surgeon: Carol Ada, MD;  [facility]: [facility];  Service: Endoscopy;;   REMOVAL  OF STONES  12/13/2021   Procedure: REMOVAL OF STONES;  Surgeon: Ladene Artist, MD;  Location: [facility];  Service: Gastroenterology;;   Joan Mayans  03/04/2021   Procedure: Joan Mayans;  Surgeon: Carol Ada, MD;  [facility]: [facility];  Service: Endoscopy;;    Family History  Problem Relation Age of Onset   Breast cancer Mother    Early death Mother    Hypertension Mother    Heart failure Father    Hypertension Father    Heart disease Father    Hyperlipidemia Father    Pancreatic cancer Paternal Grandmother        possibly   Pancreatic cancer Paternal Aunt    Colon cancer Neg Hx     Social History   Tobacco Use   Smoking status: Every Day    Packs/day: 0.50    Types: Cigarettes   Smokeless tobacco: Never  Vaping Use   Vaping Use: Never used  Substance Use Topics   Alcohol use: Not Currently   Drug use: No    Prior to Admission medications   Medication Sig Start Date End Date Taking? Authorizing Provider  amLODipine (NORVASC) 5 MG tablet TAKE 1 TABLET (5 MG TOTAL) BY MOUTH DAILY. 03/05/22   [place], Betty G, MD  benazepril (LOTENSIN) 40 MG tablet TAKE 1 TABLET BY MOUTH EVERY DAY 03/05/22   [place], Betty G, MD  fluticasone (FLONASE) 50 MCG/ACT nasal spray PLACE 1 SPRAY INTO BOTH NOSTRILS 2 (TWO)  TIMES DAILY 05/01/22   [place], Betty G, MD  hydrOXYzine (ATARAX) 25 MG tablet Take 1 tablet (25 mg total) by mouth 2 (two) times daily as needed for anxiety. 03/20/22   [place], Betty G, MD  KLOR-CON M20 20 MEQ tablet TAKE 1 TABLET BY MOUTH EVERY DAY 02/12/22   [place], Betty G, MD  metoprolol succinate (TOPROL-XL) 100 MG 24 hr tablet TAKE 1 TABLET BY MOUTH EVERY DAY WITH OR IMMEDIATELY FOLLOWING A MEAL 05/01/22   [place], Betty G, MD  mirtazapine (REMERON SOL-TAB) 15 MG disintegrating tablet Take 0.5-1 tablets (7.5-15 mg total) by mouth at bedtime. 03/20/22   [place], Betty G, MD  ondansetron (ZOFRAN-ODT) 4 MG disintegrating tablet Take 1 tablet (4 mg total) by mouth every 8  (eight) hours as needed for nausea or vomiting. 12/03/21   Tedd Sias, PA  oxyCODONE (ROXICODONE) 5 MG immediate release tablet Take 1 tablet (5 mg total) by mouth every 4 (four) hours as needed for severe pain. 05/12/22   Maudie Flakes, MD  promethazine (PHENERGAN) 25 MG tablet Take 1 tablet (25 mg total) by mouth every 6 (six) hours as needed for nausea or vomiting. 05/14/22   Leandra Vanderweele, MD  rosuvastatin (CRESTOR) 10 MG tablet TAKE 1 TABLET BY MOUTH EVERY DAY 03/26/22   [place], Betty G, MD    Allergies Cortisone, Eggs or egg-derived products, Ketorolac, Haldol [haloperidol lactate], Hydrocortisone, Iodinated contrast media, Ketorolac tromethamine, Reglan [metoclopramide], Compazine [prochlorperazine], Droperidol, and Ketamine   REVIEW OF SYSTEMS  Negative except as noted here or in the History of Present Illness.   PHYSICAL EXAMINATION  Initial Vital Signs Blood pressure (!) 153/96, pulse 85, temperature 98.5 F (36.9 C), temperature source Oral, resp. rate 18, height '6\' 1"'$  (1.854 m), weight 95 kg, SpO2 95 %.  Examination General: Well-developed, well-nourished male in no acute distress; appearance consistent with age of record HENT: normocephalic; atraumatic Eyes: Normal appearance Neck: supple Heart: regular rate and rhythm Lungs: clear to auscultation bilaterally Abdomen: soft; nondistended; gastric tenderness; bowel sounds hypoactive Extremities: No deformity; full range of motion Neurologic: Awake, alert and oriented; motor function intact in all extremities and symmetric; no facial droop Skin: Warm and dry Psychiatric: Flat affect   RESULTS  Summary of this visit's results, reviewed and interpreted by myself:   EKG Interpretation  Date/Time:    Ventricular Rate:    PR Interval:    QRS Duration:   QT Interval:    QTC Calculation:   R Axis:     Text Interpretation:         Laboratory Studies: Results for orders placed or performed during the hospital  encounter of 05/14/22 (from the past 24 hour(s))  CBC with Differential     Status: None   Collection Time: 05/14/22  1:52 AM  Result Value Ref Range   WBC 9.3 4.0 - 10.5 K/uL   RBC 4.66 4.22 - 5.81 MIL/uL   Hemoglobin 14.2 13.0 - 17.0 g/dL   HCT 40.1 39.0 - 52.0 %   MCV 86.1 80.0 - 100.0 fL   MCH 30.5 26.0 - 34.0 pg   MCHC 35.4 30.0 - 36.0 g/dL   RDW 12.5 11.5 - 15.5 %   Platelets 215 150 - 400 K/uL   nRBC 0.0 0.0 - 0.2 %   Neutrophils Relative % 54 %   Neutro Abs 5.1 1.7 - 7.7 K/uL   Lymphocytes Relative 31 %   Lymphs Abs 2.9 0.7 - 4.0 K/uL   Monocytes Relative 11 %  Monocytes Absolute 1.0 0.1 - 1.0 K/uL   Eosinophils Relative 3 %   Eosinophils Absolute 0.3 0.0 - 0.5 K/uL   Basophils Relative 1 %   Basophils Absolute 0.1 0.0 - 0.1 K/uL   Immature Granulocytes 0 %   Abs Immature Granulocytes 0.04 0.00 - 0.07 K/uL  Comprehensive metabolic panel     Status: None   Collection Time: 05/14/22  1:52 AM  Result Value Ref Range   Sodium 142 135 - 145 mmol/L   Potassium 3.5 3.5 - 5.1 mmol/L   Chloride 107 98 - 111 mmol/L   CO2 29 22 - 32 mmol/L   Glucose, Bld 97 70 - 99 mg/dL   BUN 19 6 - 20 mg/dL   Creatinine, Ser 1.03 0.61 - 1.24 mg/dL   Calcium 9.3 8.9 - 10.3 mg/dL   Total Protein 7.0 6.5 - 8.1 g/dL   Albumin 4.0 3.5 - 5.0 g/dL   AST 20 15 - 41 U/L   ALT 19 0 - 44 U/L   Alkaline Phosphatase 73 38 - 126 U/L   Total Bilirubin 0.5 0.3 - 1.2 mg/dL   GFR, Estimated >60 >60 mL/min   Anion gap 6 5 - 15  Lipase, blood     Status: None   Collection Time: 05/14/22  1:52 AM  Result Value Ref Range   Lipase 37 11 - 51 U/L   Imaging Studies: No results found.  ED COURSE and MDM  Nursing notes, initial and subsequent vitals signs, including pulse oximetry, reviewed and interpreted by myself.  Vitals:   05/14/22 0133 05/14/22 0230 05/14/22 0315 05/14/22 0400  BP:  (!) 138/98 (!) 151/107 (!) 155/105  Pulse:  80 78 72  Resp:  '18 18 18  '$ Temp:      TempSrc:      SpO2:  92% 96%  97%  Weight: 95 kg     Height: '6\' 1"'$  (1.854 m)      Medications  promethazine (PHENERGAN) 25 MG/ML injection (25 mg  Not Given 05/14/22 0218)  oxyCODONE-acetaminophen (PERCOCET/ROXICET) 5-325 MG per tablet 2 tablet (has no administration in time range)  promethazine (PHENERGAN) 25 mg in sodium chloride 0.9 % 50 mL IVPB (has no administration in time range)  sodium chloride 0.9 % bolus 1,000 mL (0 mLs Intravenous Stopped 05/14/22 0447)  promethazine (PHENERGAN) 25 mg in sodium chloride 0.9 % 50 mL IVPB (0 mg Intravenous Stopped 05/14/22 0234)  HYDROmorphone (DILAUDID) injection 2 mg (2 mg Intravenous Given 05/14/22 0213)   4:51 AM Pain and nausea improved but still present.  Will avoid any additional parenteral narcotics as patient was quite somnolent for about 2 hours and had oxygen desaturation.  Will repeat a dose of Phenergan and give a dose of oral oxycodone which is what he usually takes at home.   PROCEDURES  Procedures   ED DIAGNOSES     ICD-10-CM   1. Chronic abdominal pain  R10.9    G89.29     2. Nausea and vomiting in adult  R11.2          Shanon Rosser, MD 05/14/22 [phone]

## 2022-05-14 NOTE — ED Triage Notes (Signed)
Pt here for continued upper abd pain x 5 days. Pt has hx of pancreatitis and states he usually feels better after meds, pt was seen here 12/16 for the same and reports pain, N/V has worsened.

## 2022-05-15 ENCOUNTER — Encounter (HOSPITAL_BASED_OUTPATIENT_CLINIC_OR_DEPARTMENT_OTHER): Payer: Self-pay | Admitting: Emergency Medicine

## 2022-05-15 ENCOUNTER — Emergency Department (HOSPITAL_BASED_OUTPATIENT_CLINIC_OR_DEPARTMENT_OTHER)
Admission: EM | Admit: 2022-05-15 | Discharge: 2022-05-15 | Disposition: A | Discharge status: Home / Self Care | Payer: No Typology Code available for payment source | Attending: Emergency Medicine | Admitting: Emergency Medicine

## 2022-05-15 DIAGNOSIS — X088XXA Exposure to other specified smoke, fire and flames, initial encounter: Secondary | ICD-10-CM | POA: Diagnosis not present

## 2022-05-15 DIAGNOSIS — I10 Essential (primary) hypertension: Secondary | ICD-10-CM | POA: Insufficient documentation

## 2022-05-15 DIAGNOSIS — T22011A Burn of unspecified degree of right forearm, initial encounter: Secondary | ICD-10-CM | POA: Diagnosis present

## 2022-05-15 DIAGNOSIS — F17210 Nicotine dependence, cigarettes, uncomplicated: Secondary | ICD-10-CM | POA: Insufficient documentation

## 2022-05-15 DIAGNOSIS — Z79899 Other long term (current) drug therapy: Secondary | ICD-10-CM | POA: Diagnosis not present

## 2022-05-15 DIAGNOSIS — T310 Burns involving less than 10% of body surface: Secondary | ICD-10-CM | POA: Diagnosis not present

## 2022-05-15 DIAGNOSIS — T22111A Burn of first degree of right forearm, initial encounter: Secondary | ICD-10-CM

## 2022-05-15 DIAGNOSIS — T22211A Burn of second degree of right forearm, initial encounter: Secondary | ICD-10-CM | POA: Diagnosis not present

## 2022-05-15 MED ORDER — OXYCODONE-ACETAMINOPHEN 5-325 MG PO TABS
2.0000 | ORAL_TABLET | Freq: Once | ORAL | Status: AC
  Administered 2022-05-15: 2 via ORAL
  Filled 2022-05-15: qty 2
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
## 2022-05-16 ENCOUNTER — Encounter (HOSPITAL_BASED_OUTPATIENT_CLINIC_OR_DEPARTMENT_OTHER): Payer: Self-pay

## 2022-05-16 ENCOUNTER — Other Ambulatory Visit: Payer: Self-pay

## 2022-05-16 ENCOUNTER — Emergency Department (HOSPITAL_BASED_OUTPATIENT_CLINIC_OR_DEPARTMENT_OTHER)
Admission: EM | Admit: 2022-05-16 | Discharge: 2022-05-16 | Disposition: A | Discharge status: Home / Self Care | Payer: No Typology Code available for payment source | Attending: Emergency Medicine | Admitting: Emergency Medicine

## 2022-05-16 DIAGNOSIS — I10 Essential (primary) hypertension: Secondary | ICD-10-CM | POA: Diagnosis not present

## 2022-05-16 DIAGNOSIS — Z7952 Long term (current) use of systemic steroids: Secondary | ICD-10-CM | POA: Diagnosis not present

## 2022-05-16 DIAGNOSIS — Z79899 Other long term (current) drug therapy: Secondary | ICD-10-CM | POA: Diagnosis not present

## 2022-05-16 DIAGNOSIS — G8929 Other chronic pain: Secondary | ICD-10-CM | POA: Insufficient documentation

## 2022-05-16 DIAGNOSIS — R1084 Generalized abdominal pain: Secondary | ICD-10-CM | POA: Diagnosis present

## 2022-05-16 DIAGNOSIS — F17210 Nicotine dependence, cigarettes, uncomplicated: Secondary | ICD-10-CM | POA: Diagnosis not present

## 2022-05-16 MED ORDER — PROMETHAZINE HCL 25 MG/ML IJ SOLN
INTRAMUSCULAR | Status: AC
  Filled 2022-05-16: qty 1

## 2022-05-16 MED ORDER — SODIUM CHLORIDE 0.9 % IV BOLUS
1000.0000 mL | Freq: Once | INTRAVENOUS | Status: AC
  Administered 2022-05-16: 1000 mL via INTRAVENOUS

## 2022-05-16 MED ORDER — HYDROMORPHONE HCL 1 MG/ML IJ SOLN
1.0000 mg | Freq: Once | INTRAMUSCULAR | Status: AC
  Administered 2022-05-16: 1 mg via INTRAVENOUS
  Filled 2022-05-16: qty 1

## 2022-05-16 MED ORDER — SODIUM CHLORIDE 0.9 % IV SOLN
25.0000 mg | Freq: Once | INTRAVENOUS | Status: AC
  Administered 2022-05-16: 25 mg via INTRAVENOUS
  Filled 2022-05-16: qty 1

## 2022-05-16 NOTE — ED Triage Notes (Signed)
Complaining of upper abdominal pain that is not being relieved with treatment. Has been vomiting today

## 2022-05-16 NOTE — ED Provider Notes (Signed)
[place] DEPT [facility] Provider Note: Georgena Spurling, MD, FACEP  CSN: [id number] MRN: [id number] ARRIVAL: 05/16/22 at [place]: [place]  Abdominal Pain   HISTORY OF PRESENT ILLNESS  05/16/22 2:52 AM [name] is a 46 y.o. male with chronic abdominal pain and frequent visits to the ED for same.  He is here with epigastric pain consistent with his chronic pancreatitis pain.  He rates his pain as an 8 out of 10, sharp and stabbing in nature.  It is worse with movement or palpation.  He has had associated nausea and vomiting, about 5 episodes since yesterday.   Past Medical History:  Diagnosis Date   Chronic abdominal pain    Diverticulitis    Drug-seeking behavior    Hypertension    Kidney stones    Pancreatitis, chronic ([place]) 05/28/2005    Past Surgical History:  Procedure Laterality Date   APPENDECTOMY     CHOLECYSTECTOMY     ERCP N/A 12/30/2019   Procedure: ENDOSCOPIC RETROGRADE CHOLANGIOPANCREATOGRAPHY (ERCP);  Surgeon: Milus Banister, MD;  Location: [facility];  Service: Endoscopy;  Laterality: N/A;   ERCP N/A 03/04/2021   Procedure: ENDOSCOPIC RETROGRADE CHOLANGIOPANCREATOGRAPHY (ERCP);  Surgeon: Carol Ada, MD;  Location: [facility];  Service: Endoscopy;  Laterality: N/A;   ERCP N/A 12/13/2021   Procedure: ENDOSCOPIC RETROGRADE CHOLANGIOPANCREATOGRAPHY (ERCP);  Surgeon: Ladene Artist, MD;  Location: [facility];  Service: Gastroenterology;  Laterality: N/A;   ERCP W/ METAL STENT PLACEMENT     KIDNEY STONE SURGERY     REMOVAL OF STONES  12/30/2019   Procedure: REMOVAL OF STONES;  Surgeon: Milus Banister, MD;  [facility];  Service: Endoscopy;;   REMOVAL OF STONES  03/04/2021   Procedure: REMOVAL OF STONES;  Surgeon: Carol Ada, MD;  [facility];  Service: Endoscopy;;   REMOVAL OF STONES  12/13/2021   Procedure: REMOVAL OF STONES;  Surgeon: Ladene Artist, MD;  Location: [facility];  Service:  Gastroenterology;;   Joan Mayans  03/04/2021   Procedure: Joan Mayans;  Surgeon: Carol Ada, MD;  [facility];  Service: Endoscopy;;    Family History  Problem Relation Age of Onset   Breast cancer Mother    Early death Mother    Hypertension Mother    Heart failure Father    Hypertension Father    Heart disease Father    Hyperlipidemia Father    Pancreatic cancer Paternal Grandmother        possibly   Pancreatic cancer Paternal Aunt    Colon cancer Neg Hx     Social History   Tobacco Use   Smoking status: Every Day    Packs/day: 0.50    Types: Cigarettes   Smokeless tobacco: Never  Vaping Use   Vaping Use: Never used  Substance Use Topics   Alcohol use: Not Currently   Drug use: No    Prior to Admission medications   Medication Sig Start Date End Date Taking? Authorizing Provider  amLODipine (NORVASC) 5 MG tablet TAKE 1 TABLET (5 MG TOTAL) BY MOUTH DAILY. 03/05/22   [place], Betty G, MD  benazepril (LOTENSIN) 40 MG tablet TAKE 1 TABLET BY MOUTH EVERY DAY 03/05/22   [place], Betty G, MD  fluticasone [facility]) 50 MCG/ACT nasal spray PLACE 1 SPRAY INTO BOTH NOSTRILS 2 (TWO) TIMES DAILY 05/01/22   [place], Betty G, MD  hydrOXYzine (ATARAX) 25 MG tablet Take 1 tablet (25 mg total) by mouth 2 (two) times daily as  needed for anxiety. 03/20/22   [place], Betty G, MD  KLOR-CON M20 20 MEQ tablet TAKE 1 TABLET BY MOUTH EVERY DAY 02/12/22   [place], Betty G, MD  metoprolol succinate (TOPROL-XL) 100 MG 24 hr tablet TAKE 1 TABLET BY MOUTH EVERY DAY WITH OR IMMEDIATELY FOLLOWING A MEAL 05/01/22   [place], Betty G, MD  mirtazapine (REMERON SOL-TAB) 15 MG disintegrating tablet Take 0.5-1 tablets (7.5-15 mg total) by mouth at bedtime. 03/20/22   [place], Betty G, MD  ondansetron (ZOFRAN-ODT) 4 MG disintegrating tablet Take 1 tablet (4 mg total) by mouth every 8 (eight) hours as needed for nausea or vomiting. 12/03/21   Tedd Sias, PA  oxyCODONE (ROXICODONE) 5 MG immediate  release tablet Take 1 tablet (5 mg total) by mouth every 4 (four) hours as needed for severe pain. 05/12/22   Maudie Flakes, MD  promethazine (PHENERGAN) 25 MG tablet Take 1 tablet (25 mg total) by mouth every 6 (six) hours as needed for nausea or vomiting. 05/14/22   Kimothy Kishimoto, MD  rosuvastatin (CRESTOR) 10 MG tablet TAKE 1 TABLET BY MOUTH EVERY DAY 03/26/22   [place], Betty G, MD    Allergies Cortisone, Eggs or egg-derived products, Ketorolac, Haldol [haloperidol lactate], Hydrocortisone, Iodinated contrast media, Ketorolac tromethamine, Reglan [metoclopramide], Compazine [prochlorperazine], Droperidol, and Ketamine   REVIEW OF SYSTEMS  Negative except as noted here or in the History of Present Illness.   PHYSICAL EXAMINATION  Initial Vital Signs Blood pressure (!) 167/102, pulse (!) 101, temperature 98.2 F (36.8 C), temperature source Oral, resp. rate 20, height '6\' 1"'$  (1.854 m), weight 95.3 kg, SpO2 98 %.  Examination General: Well-developed, well-nourished male in no acute distress; appearance consistent with age of record HENT: normocephalic; atraumatic Eyes: Normal appearance Neck: supple Heart: regular rate and rhythm Lungs: clear to auscultation bilaterally Abdomen: soft; nondistended; epigastric tenderness; bowel sounds present Extremities: No deformity; full range of motion; pulses normal Neurologic: Awake, alert and oriented; motor function intact in all extremities and symmetric; no facial droop Skin: Warm and dry Psychiatric: Normal mood and affect   RESULTS  Summary of this visit's results, reviewed and interpreted by myself:   EKG Interpretation  Date/Time:    Ventricular Rate:    PR Interval:    QRS Duration:   QT Interval:    QTC Calculation:   R Axis:     Text Interpretation:         Laboratory Studies: No results found for this or any previous visit (from the past 24 hour(s)). Imaging Studies: No results found.  ED COURSE and MDM  Nursing  notes, initial and subsequent vitals signs, including pulse oximetry, reviewed and interpreted by myself.  Vitals:   05/16/22 0300 05/16/22 0330 05/16/22 0400 05/16/22 0430  BP: (!) 155/99 (!) 160/110 (!) 152/109 (!) 157/96  Pulse: 79 72 81 70  Resp: '18 18 18   '$ Temp:   98.2 F (36.8 C)   TempSrc:      SpO2: 92% 92% 92% 95%  Weight:      Height:       Medications  promethazine (PHENERGAN) 25 MG/ML injection (  Not Given 05/16/22 0251)  promethazine (PHENERGAN) 25 MG/ML injection (  Not Given 05/16/22 0446)  sodium chloride 0.9 % bolus 1,000 mL (0 mLs Intravenous Stopped 05/16/22 0401)  promethazine (PHENERGAN) 25 mg in sodium chloride 0.9 % 50 mL IVPB (0 mg Intravenous Stopped 05/16/22 0258)  HYDROmorphone (DILAUDID) injection 1 mg (1 mg Intravenous Given 05/16/22 0242)  promethazine (PHENERGAN) 25 mg in sodium chloride 0.9 % 50 mL IVPB (25 mg Intravenous New Bag/Given 05/16/22 0445)  HYDROmorphone (DILAUDID) injection 1 mg (1 mg Intravenous Given 05/16/22 0446)   5:25 AM Patient feeling better, states he is ready to go home.  He has had no vomiting while in the ED.   PROCEDURES  Procedures   ED DIAGNOSES     ICD-10-CM   1. Chronic abdominal pain  R10.9    G89.29          Buel Molder, Jenny Reichmann, MD 05/16/22 [phone]

## 2022-05-27 ENCOUNTER — Other Ambulatory Visit: Payer: Self-pay | Admitting: Family Medicine

## 2022-05-27 DIAGNOSIS — G4700 Insomnia, unspecified: Secondary | ICD-10-CM

## 2022-05-27 DIAGNOSIS — F419 Anxiety disorder, unspecified: Secondary | ICD-10-CM

## 2022-05-28 NOTE — Telephone Encounter (Signed)
Last OV 03/20/22 notes: Anxiety disorder, unspecified type We discussed treatment options, he has failed a couple of SSRI's in the past. Xanax has helped in the past but I do not recommend it.  He agrees with trying Mirtazapine 15 mg , starting 7.5 mg at bedtime and can increase to the whole tab in 10-14 days of well tolerated. EKG 48-72 hours after starting medication. Hydralazine 25 mg for acute anxiety. Sided effects of medications discussed. List of psychotherapist at [facility] health given, so he can arrange an appt. Return in about 2 months (around 05/20/2022) for Monday for EKG..   Pt does not have future visit scheduled

## 2022-06-01 ENCOUNTER — Other Ambulatory Visit: Payer: Self-pay

## 2022-06-01 ENCOUNTER — Emergency Department (HOSPITAL_BASED_OUTPATIENT_CLINIC_OR_DEPARTMENT_OTHER)
Admission: EM | Admit: 2022-06-01 | Discharge: 2022-06-01 | Disposition: A | Discharge status: Home / Self Care | Payer: No Typology Code available for payment source | Attending: Emergency Medicine | Admitting: Emergency Medicine

## 2022-06-01 ENCOUNTER — Other Ambulatory Visit (HOSPITAL_BASED_OUTPATIENT_CLINIC_OR_DEPARTMENT_OTHER): Payer: Self-pay

## 2022-06-01 ENCOUNTER — Encounter (HOSPITAL_BASED_OUTPATIENT_CLINIC_OR_DEPARTMENT_OTHER): Payer: Self-pay | Admitting: Emergency Medicine

## 2022-06-01 DIAGNOSIS — R1013 Epigastric pain: Secondary | ICD-10-CM | POA: Diagnosis not present

## 2022-06-01 DIAGNOSIS — R197 Diarrhea, unspecified: Secondary | ICD-10-CM | POA: Insufficient documentation

## 2022-06-01 DIAGNOSIS — R109 Unspecified abdominal pain: Secondary | ICD-10-CM | POA: Diagnosis present

## 2022-06-01 DIAGNOSIS — R112 Nausea with vomiting, unspecified: Secondary | ICD-10-CM | POA: Insufficient documentation

## 2022-06-01 DIAGNOSIS — I10 Essential (primary) hypertension: Secondary | ICD-10-CM | POA: Diagnosis not present

## 2022-06-01 DIAGNOSIS — F17210 Nicotine dependence, cigarettes, uncomplicated: Secondary | ICD-10-CM | POA: Diagnosis not present

## 2022-06-01 DIAGNOSIS — Z79899 Other long term (current) drug therapy: Secondary | ICD-10-CM | POA: Insufficient documentation

## 2022-06-01 LAB — COMPREHENSIVE METABOLIC PANEL
ALT: 27 U/L (ref 0–44)
AST: 24 U/L (ref 15–41)
Albumin: 4.2 g/dL (ref 3.5–5.0)
Alkaline Phosphatase: 75 U/L (ref 38–126)
Anion gap: 8 (ref 5–15)
BUN: 15 mg/dL (ref 6–20)
CO2: 21 mmol/L — ABNORMAL LOW (ref 22–32)
Calcium: 9.3 mg/dL (ref 8.9–10.3)
Chloride: 108 mmol/L (ref 98–111)
Creatinine, Ser: 0.72 mg/dL (ref 0.61–1.24)
GFR, Estimated: >60 mL/min (ref >60)
Glucose, Bld: 104 mg/dL — ABNORMAL HIGH (ref 70–99)
Potassium: 3.8 mmol/L (ref 3.5–5.1)
Sodium: 137 mmol/L (ref 135–145)
Total Bilirubin: 0.6 mg/dL (ref 0.3–1.2)
Total Protein: 7.4 g/dL (ref 6.5–8.1)

## 2022-06-01 LAB — CBC
HCT: 43.3 % (ref 39.0–52.0)
Hemoglobin: 15.2 g/dL (ref 13.0–17.0)
MCH: 30 pg (ref 26.0–34.0)
MCHC: 35.1 g/dL (ref 30.0–36.0)
MCV: 85.4 fL (ref 80.0–100.0)
Platelets: 249 10*3/uL (ref 150–400)
RBC: 5.07 MIL/uL (ref 4.22–5.81)
RDW: 12.7 % (ref 11.5–15.5)
WBC: 10.5 10*3/uL (ref 4.0–10.5)
nRBC: 0 % (ref 0.0–0.2)

## 2022-06-01 LAB — URINALYSIS, ROUTINE W REFLEX MICROSCOPIC
Bilirubin Urine: NEGATIVE
Glucose, UA: NEGATIVE mg/dL
Ketones, ur: NEGATIVE mg/dL
Leukocytes,Ua: NEGATIVE
Nitrite: NEGATIVE
Protein, ur: NEGATIVE mg/dL
Specific Gravity, Urine: 1.02 (ref 1.005–1.030)
pH: 5 (ref 5.0–8.0)

## 2022-06-01 LAB — URINALYSIS, MICROSCOPIC (REFLEX)

## 2022-06-01 LAB — LIPASE, BLOOD: Lipase: 54 U/L — ABNORMAL HIGH (ref 11–51)

## 2022-06-01 MED ORDER — SODIUM CHLORIDE 0.9 % IV BOLUS
1000.0000 mL | Freq: Once | INTRAVENOUS | Status: AC
  Administered 2022-06-01: 1000 mL via INTRAVENOUS

## 2022-06-01 MED ORDER — ONDANSETRON HCL 4 MG/2ML IJ SOLN
4.0000 mg | Freq: Once | INTRAMUSCULAR | Status: AC
  Administered 2022-06-01: 4 mg via INTRAVENOUS
  Filled 2022-06-01: qty 2

## 2022-06-01 MED ORDER — ONDANSETRON HCL 4 MG PO TABS
4.0000 mg | ORAL_TABLET | ORAL | 0 refills | Status: AC | PRN
  Filled 2022-06-01: qty 5, 1d supply, fill #0

## 2022-06-01 MED ORDER — OXYCODONE HCL 5 MG PO TABS
5.0000 mg | ORAL_TABLET | ORAL | 0 refills | Status: DC | PRN
  Filled 2022-06-01: qty 5, 1d supply, fill #0

## 2022-06-01 MED ORDER — FAMOTIDINE IN NACL 20-0.9 MG/50ML-% IV SOLN
20.0000 mg | Freq: Once | INTRAVENOUS | Status: AC
  Administered 2022-06-01: 20 mg via INTRAVENOUS
  Filled 2022-06-01: qty 50

## 2022-06-01 MED ORDER — OXYCODONE-ACETAMINOPHEN 5-325 MG PO TABS
1.0000 | ORAL_TABLET | Freq: Once | ORAL | Status: AC
  Administered 2022-06-01: 1 via ORAL
  Filled 2022-06-01: qty 1

## 2022-06-01 MED ORDER — HYDROMORPHONE HCL 1 MG/ML IJ SOLN
1.0000 mg | Freq: Once | INTRAMUSCULAR | Status: AC
  Administered 2022-06-01: 1 mg via INTRAVENOUS
  Filled 2022-06-01: qty 1

## 2022-06-01 NOTE — ED Notes (Signed)
Pt provided Gatorade per request but reports is has not tried to drink yet.  Pt encouraged to take a few sips.

## 2022-06-01 NOTE — ED Notes (Signed)
Pt verbalized understanding of discharge instructions. Opportunity for questions provided.  

## 2022-06-01 NOTE — ED Triage Notes (Signed)
Pt arrives pov, slow gait, endorses epigastric pain radiating to back with n/v x 2 days. No otc meds today

## 2022-06-01 NOTE — ED Provider Notes (Signed)
[facility] EMERGENCY DEPARTMENT Provider Note  CSN: [id number] Arrival date & time: 06/01/22 1009  Chief Complaint(s) Abdominal Pain  HPI [name] is a 47 y.o. male with past medical history as below, significant for chronic abd pain, chronic pancreatitis,  HTN, diverticulitis who presents to the ED with complaint of abd pain w/ n and v. Chronic pancreatitis, feels similar to prior episodes of pain. Pain began around 2-3 days ago, having nausea and vomiting and unable to take his oral pain medication. Reduced po intake last 24 hours. No diarrhea. No fevers or chills. Abd pain is epigastric radiating to his back similar to prior. No etoh use reported, no frequent THC usage, no excessive NSAID or apap use.   Past Medical History Past Medical History:  Diagnosis Date   Chronic abdominal pain    Diverticulitis    Drug-seeking behavior    Hypertension    Kidney stones    Pancreatitis, chronic ([place]) 05/28/2005   Patient Active Problem List   Diagnosis Date Noted   Elevated LFTs    Calculus of bile duct without cholangitis with obstruction    Abdominal pain 12/11/2021   Intractable nausea and vomiting 12/11/2021   Atherosclerosis of aorta ([place]) 05/08/2021   Hypokalemia 05/08/2021   Acute cholangitis 03/03/2021   Diverticulitis 11/25/2020   Kidney stones 11/25/2020   Hyperlipidemia, mixed 03/01/2020   Abnormal magnetic resonance cholangiopancreatography (MRCP)    Choledocholithiasis    E coli bacteremia    Bacteremia due to Enterobacter species    Bacteremia 12/27/2019   QT prolongation 12/27/2019   Nausea without vomiting    Chronic pain disorder 08/29/2018   Back pain, chronic 08/29/2018   Nausea and vomiting in adult 02/25/2018   Hypertension, essential, benign 01/18/2018   Chronic biliary pancreatitis ([place]) 01/18/2018   Chronic abdominal pain 01/14/2018   Home Medication(s) Prior to Admission medications   Medication Sig Start Date End Date Taking?  Authorizing Provider  ondansetron (ZOFRAN) 4 MG tablet Take 1 tablet (4 mg total) by mouth every 4 (four) hours as needed for nausea or vomiting. 06/01/22  Yes Wynona Dove A, DO  oxyCODONE (ROXICODONE) 5 MG immediate release tablet Take 1 tablet (5 mg total) by mouth every 4 (four) hours as needed for up to 5 doses for severe pain. 06/01/22  Yes Wynona Dove A, DO  amLODipine (NORVASC) 5 MG tablet TAKE 1 TABLET (5 MG TOTAL) BY MOUTH DAILY. 03/05/22   Martinique, Betty G, MD  benazepril (LOTENSIN) 40 MG tablet TAKE 1 TABLET BY MOUTH EVERY DAY 03/05/22   Martin[place], Betty G, MD  fluticasone [facility]) 50 MCG/ACT nasal spray PLACE 1 SPRAY INTO BOTH NOSTRILS 2 (TWO) TIMES DAILY 05/01/22   Martin[place], Betty G, MD  hydrOXYzine (ATARAX) 25 MG tablet Take 1 tablet (25 mg total) by mouth 2 (two) times daily as needed for anxiety. 03/20/22   [place], Betty G, MD  KLOR-CON M20 20 MEQ tablet TAKE 1 TABLET BY MOUTH EVERY DAY 02/12/22   [place], Betty G, MD  metoprolol succinate (TOPROL-XL) 100 MG 24 hr tablet TAKE 1 TABLET BY MOUTH EVERY DAY WITH OR IMMEDIATELY FOLLOWING A MEAL 05/01/22   Martin[place], Betty G, MD  mirtazapine (REMERON SOL-TAB) 15 MG disintegrating tablet TAKE 0.5-1 TABLETS (7.5-15 MG TOTAL) BY MOUTH AT BEDTIME. 06/01/22   [place], Betty G, MD  ondansetron (ZOFRAN-ODT) 4 MG disintegrating tablet Take 1 tablet (4 mg total) by mouth every 8 (eight) hours as needed for nausea or vomiting. 12/03/21  Pati Gallo S, PA  oxyCODONE (ROXICODONE) 5 MG immediate release tablet Take 1 tablet (5 mg total) by mouth every 4 (four) hours as needed for severe pain. 05/12/22   Maudie Flakes, MD  promethazine (PHENERGAN) 25 MG tablet Take 1 tablet (25 mg total) by mouth every 6 (six) hours as needed for nausea or vomiting. 05/14/22   Molpus, Jenny Reichmann, MD  rosuvastatin (CRESTOR) 10 MG tablet TAKE 1 TABLET BY MOUTH EVERY DAY 03/26/22   Martinique, Betty G, MD                                                                                                                                     Past Surgical History Past Surgical History:  Procedure Laterality Date   APPENDECTOMY     CHOLECYSTECTOMY     ERCP N/A 12/30/2019   Procedure: ENDOSCOPIC RETROGRADE CHOLANGIOPANCREATOGRAPHY (ERCP);  Surgeon: Milus Banister, MD;  Location: [facility];  Service: Endoscopy;  Laterality: N/A;   ERCP N/A 03/04/2021   Procedure: ENDOSCOPIC RETROGRADE CHOLANGIOPANCREATOGRAPHY (ERCP);  Surgeon: Carol Ada, MD;  Location: [facility];  Service: Endoscopy;  Laterality: N/A;   ERCP N/A 12/13/2021   Procedure: ENDOSCOPIC RETROGRADE CHOLANGIOPANCREATOGRAPHY (ERCP);  Surgeon: Ladene Artist, MD;  Location: [facility];  Service: Gastroenterology;  Laterality: N/A;   ERCP W/ METAL STENT PLACEMENT     KIDNEY STONE SURGERY     REMOVAL OF STONES  12/30/2019   Procedure: REMOVAL OF STONES;  Surgeon: Milus Banister, MD;  Location: [facility];  Service: Endoscopy;;   REMOVAL OF STONES  03/04/2021   Procedure: REMOVAL OF STONES;  Surgeon: Carol Ada, MD;  Location: [facility];  Service: Endoscopy;;   REMOVAL OF STONES  12/13/2021   Procedure: REMOVAL OF STONES;  Surgeon: Ladene Artist, MD;  Location: [facility];  Service: Gastroenterology;;   Joan Mayans  03/04/2021   Procedure: Joan Mayans;  Surgeon: Carol Ada, MD;  Location: [facility];  Service: Endoscopy;;   Family History Family History  Problem Relation Age of Onset   Breast cancer Mother    Early death Mother    Hypertension Mother    Heart failure Father    Hypertension Father    Heart disease Father    Hyperlipidemia Father    Pancreatic cancer Paternal Grandmother        possibly   Pancreatic cancer Paternal Aunt    Colon cancer Neg Hx     Social History Social History   Tobacco Use   Smoking status: Every Day    Packs/day: 0.50    Types: Cigarettes   Smokeless tobacco: Never  Vaping Use   Vaping Use: Never used  Substance Use Topics   Alcohol use: Not Currently    Drug use: No   Allergies Cortisone, Eggs or egg-derived products, Ketorolac, Haldol [haloperidol lactate], Hydrocortisone, Iodinated contrast media, Ketorolac tromethamine, Reglan [metoclopramide], Compazine [prochlorperazine], Droperidol, and Ketamine  Review of Systems  Review of Systems  Constitutional:  Negative for chills and fever.  HENT:  Negative for facial swelling and trouble swallowing.   Eyes:  Negative for photophobia and visual disturbance.  Respiratory:  Negative for cough and shortness of breath.   Cardiovascular:  Negative for chest pain and palpitations.  Gastrointestinal:  Positive for abdominal pain, nausea and vomiting.  Endocrine: Negative for polydipsia and polyuria.  Genitourinary:  Negative for difficulty urinating and hematuria.  Musculoskeletal:  Negative for gait problem and joint swelling.  Skin:  Negative for pallor and rash.  Neurological:  Negative for syncope and headaches.  Psychiatric/Behavioral:  Negative for agitation and confusion.     Physical Exam Vital Signs  I have reviewed the triage vital signs BP (!) 160/99   Pulse 75   Temp 97.9 F (36.6 C) (Oral)   Resp 13   Ht '6\' 1"'$  (1.854 m)   Wt 97.5 kg   SpO2 97%   BMI 28.37 kg/m  Physical Exam Vitals and nursing note reviewed.  Constitutional:      General: He is not in acute distress.    Appearance: He is well-developed. He is not ill-appearing or diaphoretic.  HENT:     Head: Normocephalic and atraumatic.     Right Ear: External ear normal.     Left Ear: External ear normal.     Mouth/Throat:     Mouth: Mucous membranes are moist.  Eyes:     General: No scleral icterus. Cardiovascular:     Rate and Rhythm: Normal rate and regular rhythm.     Pulses: Normal pulses.     Heart sounds: Normal heart sounds.  Pulmonary:     Effort: Pulmonary effort is normal. No respiratory distress.     Breath sounds: Normal breath sounds.  Abdominal:     General: Abdomen is flat.     Palpations:  Abdomen is soft.     Tenderness: There is abdominal tenderness in the epigastric area. There is no guarding or rebound. Negative signs include Murphy's sign.  Musculoskeletal:        General: Normal range of motion.     Cervical back: Normal range of motion.     Right lower leg: No edema.     Left lower leg: No edema.  Skin:    General: Skin is warm and dry.     Capillary Refill: Capillary refill takes less than 2 seconds.  Neurological:     Mental Status: He is alert and oriented to person, place, and time.  Psychiatric:        Mood and Affect: Mood normal.        Behavior: Behavior normal.     ED Results and Treatments Labs (all labs ordered are listed, but only abnormal results are displayed) Labs Reviewed  LIPASE, BLOOD - Abnormal; Notable for the following components:      Result Value   Lipase 54 (*)    All other components within normal limits  COMPREHENSIVE METABOLIC PANEL - Abnormal; Notable for the following components:   CO2 21 (*)    Glucose, Bld 104 (*)    All other components within normal limits  URINALYSIS, ROUTINE W REFLEX MICROSCOPIC - Abnormal; Notable for the following components:   Hgb urine dipstick TRACE (*)    All other components within normal limits  URINALYSIS, MICROSCOPIC (REFLEX) - Abnormal; Notable for the following components:   Bacteria, UA RARE (*)    All other components within normal limits  CBC  Radiology No results found.  Pertinent labs & imaging results that were available during my care of the patient were reviewed by me and considered in my medical decision making (see MDM for details).  Medications Ordered in ED Medications  sodium chloride 0.9 % bolus 1,000 mL (1,000 mLs Intravenous New Bag/Given 06/01/22 1412)  HYDROmorphone (DILAUDID) injection 1 mg (1 mg Intravenous Given 06/01/22 1412)  ondansetron (ZOFRAN)  injection 4 mg (4 mg Intravenous Given 06/01/22 1411)  famotidine (PEPCID) IVPB 20 mg premix (0 mg Intravenous Stopped 06/01/22 1448)  oxyCODONE-acetaminophen (PERCOCET/ROXICET) 5-325 MG per tablet 1 tablet (1 tablet Oral Given 06/01/22 1517)                                                                                                                                     Procedures Procedures  (including critical care time)  Medical Decision Making / ED Course   MDM:  [name] is a 47 y.o. male  with past medical history as below, significant for chronic abd pain, chronic pancreatitis,  HTN, diverticulitis who presents to the ED with complaint of abd pain w/ n and v.. The complaint involves an extensive differential diagnosis and also carries with it a high risk of complications and morbidity.  Serious etiology was considered. Ddx includes but is not limited to: Differential diagnosis includes but is not exclusive to acute cholecystitis, intrathoracic causes for epigastric abdominal pain, gastritis, duodenitis, pancreatitis, small bowel or large bowel obstruction, abdominal aortic aneurysm, hernia, gastritis, etc.   On initial assessment the patient is: HDS< pain to epigastrium w/ palpation. HDS. Will give ivf analgesia and anti-emetic, screening labs ordered Vital signs and nursing notes were reviewed    Labs reviewed, lipase is mildly elevated c/w chronic pancreatitis. He had CTAP around 1 month ago, feel it is not necessary to repeat imaging at this time given stable labs and hds, not surgical abdomen and improvement in his symptoms with medications in the ED  Lipase minimally elevated, concern for chronic pancreatitis. HDS, not septic.   Feeling much better on recheck, tolerating PO, abd is soft/nontender, HDS  Stable for discharge home with close o/p GI f/u  The patient improved significantly and was discharged in stable condition. Detailed discussions were had with the patient  regarding current findings, and need for close f/u with PCP or on call doctor. The patient has been instructed to return immediately if the symptoms worsen in any way for re-evaluation. Patient verbalized understanding and is in agreement with current care plan. All questions answered prior to discharge.    Additional history obtained: -Additional history obtained from na -External records from outside source obtained and reviewed including: Chart review including previous notes, labs, imaging, consultation notes including prior ed visits, prior labs/imaging/home medications   Lab Tests: -I ordered, reviewed, and interpreted labs.   The pertinent results include:   Labs Reviewed  LIPASE, BLOOD - Abnormal;  Notable for the following components:      Result Value   Lipase 54 (*)    All other components within normal limits  COMPREHENSIVE METABOLIC PANEL - Abnormal; Notable for the following components:   CO2 21 (*)    Glucose, Bld 104 (*)    All other components within normal limits  URINALYSIS, ROUTINE W REFLEX MICROSCOPIC - Abnormal; Notable for the following components:   Hgb urine dipstick TRACE (*)    All other components within normal limits  URINALYSIS, MICROSCOPIC (REFLEX) - Abnormal; Notable for the following components:   Bacteria, UA RARE (*)    All other components within normal limits  CBC    Notable for lipase mildly elevated  EKG   EKG Interpretation  Date/Time:  Friday June 01 2022 11:06:19 EST Ventricular Rate:  71 PR Interval:  152 QRS Duration: 97 QT Interval:  415 QTC Calculation: 451 R Axis:   58 Text Interpretation: Sinus rhythm Confirmed by Wynona Dove ([id number]) on 06/01/2022 1:54:11 PM         Imaging Studies ordered: I ordered imaging studies including na   Medicines ordered and prescription drug management: Meds ordered this encounter  Medications   sodium chloride 0.9 % bolus 1,000 mL   HYDROmorphone (DILAUDID) injection 1 mg    ondansetron (ZOFRAN) injection 4 mg   famotidine (PEPCID) IVPB 20 mg premix   oxyCODONE-acetaminophen (PERCOCET/ROXICET) 5-325 MG per tablet 1 tablet   oxyCODONE (ROXICODONE) 5 MG immediate release tablet    Sig: Take 1 tablet (5 mg total) by mouth every 4 (four) hours as needed for up to 5 doses for severe pain.    Dispense:  5 tablet    Refill:  0   ondansetron (ZOFRAN) 4 MG tablet    Sig: Take 1 tablet (4 mg total) by mouth every 4 (four) hours as needed for nausea or vomiting.    Dispense:  5 tablet    Refill:  0    -I have reviewed the patients home medicines and have made adjustments as needed   Consultations Obtained: I requested consultation with the na,  and discussed lab and imaging findings as well as pertinent plan - they recommend: na   Cardiac Monitoring: The patient was maintained on a cardiac monitor.  I personally viewed and interpreted the cardiac monitored which showed an underlying rhythm of: NSR  Social Determinants of Health:  Diagnosis or treatment significantly limited by social determinants of health: current smoker Counseled patient for approximately 3 minutes regarding smoking cessation. Discussed risks of smoking and how they applied and affected their visit here today. Patient not ready to quit at this time, however will follow up with their primary doctor when they are.   CPT code: [phone]: intermediate counseling for smoking cessation    Reevaluation: After the interventions noted above, I reevaluated the patient and found that they have improved  Co morbidities that complicate the patient evaluation  Past Medical History:  Diagnosis Date   Chronic abdominal pain    Diverticulitis    Drug-seeking behavior    Hypertension    Kidney stones    Pancreatitis, chronic ([place]) 05/28/2005      Dispostion: Disposition decision including need for hospitalization was considered, and patient discharged from emergency department.    Final Clinical  Impression(s) / ED Diagnoses Final diagnoses:  Epigastric pain     This chart was dictated using voice recognition software.  Despite best efforts to proofread,  errors can occur which can  change the documentation meaning.    Jeanell Sparrow, DO 06/01/22 1527

## 2022-06-01 NOTE — Discharge Instructions (Addendum)
It was a pleasure caring for you today in the emergency department.  Please return to the emergency department for any worsening or worrisome symptoms.  Please follow up gi specialist, please see PCP

## 2022-06-01 NOTE — Telephone Encounter (Signed)
He was supposed to have an EKG after starting medication and did not do so as recommended but had an EKG done on 05/14/22 in the ED with no significant changes. So refill for Mirtazapine sent. Sylvestre Rathgeber [place], MD

## 2022-06-01 NOTE — ED Notes (Signed)
ED Provider at bedside. 

## 2022-06-03 ENCOUNTER — Other Ambulatory Visit: Payer: Self-pay

## 2022-06-03 ENCOUNTER — Emergency Department (HOSPITAL_BASED_OUTPATIENT_CLINIC_OR_DEPARTMENT_OTHER)
Admission: EM | Admit: 2022-06-03 | Discharge: 2022-06-03 | Disposition: A | Discharge status: Home / Self Care | Payer: No Typology Code available for payment source | Attending: Emergency Medicine | Admitting: Emergency Medicine

## 2022-06-03 ENCOUNTER — Encounter (HOSPITAL_BASED_OUTPATIENT_CLINIC_OR_DEPARTMENT_OTHER): Payer: Self-pay

## 2022-06-03 DIAGNOSIS — E876 Hypokalemia: Secondary | ICD-10-CM | POA: Diagnosis not present

## 2022-06-03 DIAGNOSIS — K861 Other chronic pancreatitis: Secondary | ICD-10-CM | POA: Diagnosis not present

## 2022-06-03 DIAGNOSIS — D72829 Elevated white blood cell count, unspecified: Secondary | ICD-10-CM | POA: Diagnosis not present

## 2022-06-03 DIAGNOSIS — R109 Unspecified abdominal pain: Secondary | ICD-10-CM | POA: Diagnosis present

## 2022-06-03 LAB — COMPREHENSIVE METABOLIC PANEL
ALT: 21 U/L (ref 0–44)
AST: 24 U/L (ref 15–41)
Albumin: 3.9 g/dL (ref 3.5–5.0)
Alkaline Phosphatase: 78 U/L (ref 38–126)
Anion gap: 11 (ref 5–15)
BUN: 12 mg/dL (ref 6–20)
CO2: 21 mmol/L — ABNORMAL LOW (ref 22–32)
Calcium: 9.1 mg/dL (ref 8.9–10.3)
Chloride: 107 mmol/L (ref 98–111)
Creatinine, Ser: 0.77 mg/dL (ref 0.61–1.24)
GFR, Estimated: >60 mL/min (ref >60)
Glucose, Bld: 118 mg/dL — ABNORMAL HIGH (ref 70–99)
Potassium: 2.8 mmol/L — ABNORMAL LOW (ref 3.5–5.1)
Sodium: 139 mmol/L (ref 135–145)
Total Bilirubin: 0.3 mg/dL (ref 0.3–1.2)
Total Protein: 7.2 g/dL (ref 6.5–8.1)

## 2022-06-03 LAB — CBC WITH DIFFERENTIAL/PLATELET
Abs Immature Granulocytes: 0.05 10*3/uL (ref 0.00–0.07)
Basophils Absolute: 0.1 10*3/uL (ref 0.0–0.1)
Basophils Relative: 1 %
Eosinophils Absolute: 0.3 10*3/uL (ref 0.0–0.5)
Eosinophils Relative: 2 %
HCT: 42.6 % (ref 39.0–52.0)
Hemoglobin: 15.1 g/dL (ref 13.0–17.0)
Immature Granulocytes: 0 %
Lymphocytes Relative: 24 %
Lymphs Abs: 3 10*3/uL (ref 0.7–4.0)
MCH: 30.1 pg (ref 26.0–34.0)
MCHC: 35.4 g/dL (ref 30.0–36.0)
MCV: 84.9 fL (ref 80.0–100.0)
Monocytes Absolute: 1.1 10*3/uL — ABNORMAL HIGH (ref 0.1–1.0)
Monocytes Relative: 9 %
Neutro Abs: 8 10*3/uL — ABNORMAL HIGH (ref 1.7–7.7)
Neutrophils Relative %: 64 %
Platelets: 251 10*3/uL (ref 150–400)
RBC: 5.02 MIL/uL (ref 4.22–5.81)
RDW: 12.5 % (ref 11.5–15.5)
WBC: 12.5 10*3/uL — ABNORMAL HIGH (ref 4.0–10.5)
nRBC: 0 % (ref 0.0–0.2)

## 2022-06-03 LAB — LIPASE, BLOOD: Lipase: 46 U/L (ref 11–51)

## 2022-06-03 MED ORDER — LACTATED RINGERS IV BOLUS
1000.0000 mL | Freq: Once | INTRAVENOUS | Status: AC
  Administered 2022-06-03: 1000 mL via INTRAVENOUS

## 2022-06-03 MED ORDER — SODIUM CHLORIDE 0.9 % IV SOLN
12.5000 mg | Freq: Once | INTRAVENOUS | Status: AC
  Administered 2022-06-03: 12.5 mg via INTRAVENOUS
  Filled 2022-06-03: qty 0.5

## 2022-06-03 MED ORDER — POTASSIUM CHLORIDE CRYS ER 20 MEQ PO TBCR
40.0000 meq | EXTENDED_RELEASE_TABLET | Freq: Once | ORAL | Status: AC
  Administered 2022-06-03: 40 meq via ORAL
  Filled 2022-06-03: qty 2

## 2022-06-03 MED ORDER — HYDROMORPHONE HCL 1 MG/ML IJ SOLN
1.0000 mg | Freq: Once | INTRAMUSCULAR | Status: AC
  Administered 2022-06-03: 1 mg via INTRAVENOUS
  Filled 2022-06-03: qty 1

## 2022-06-03 MED ORDER — FENTANYL CITRATE PF 50 MCG/ML IJ SOSY
50.0000 ug | PREFILLED_SYRINGE | Freq: Once | INTRAMUSCULAR | Status: DC
  Filled 2022-06-03: qty 1

## 2022-06-03 MED ORDER — ONDANSETRON HCL 4 MG/2ML IJ SOLN
4.0000 mg | Freq: Once | INTRAMUSCULAR | Status: AC
  Administered 2022-06-03: 4 mg via INTRAVENOUS
  Filled 2022-06-03: qty 2

## 2022-06-03 MED ORDER — LACTATED RINGERS IV BOLUS: 1000.0000 mL | Freq: Once | INTRAVENOUS | 0 refills | Status: DC

## 2022-06-03 MED ORDER — MAGNESIUM SULFATE 2 GM/50ML IV SOLN
2.0000 g | Freq: Once | INTRAVENOUS | Status: AC
  Administered 2022-06-03: 2 g via INTRAVENOUS
  Filled 2022-06-03: qty 50

## 2022-06-03 MED ORDER — FENTANYL CITRATE PF 50 MCG/ML IJ SOSY
50.0000 ug | PREFILLED_SYRINGE | Freq: Once | INTRAMUSCULAR | Status: AC
  Administered 2022-06-03: 50 ug via INTRAVENOUS
  Filled 2022-06-03: qty 1

## 2022-06-03 MED ORDER — MORPHINE SULFATE (PF) 4 MG/ML IV SOLN: 4.0000 mg | Freq: Once | INTRAVENOUS | Status: DC

## 2022-06-03 NOTE — ED Notes (Signed)
Patient given water and peanut butter crackers at the bedside.

## 2022-06-03 NOTE — ED Provider Notes (Signed)
[facility] EMERGENCY DEPARTMENT Provider Note   CSN: [id number] Arrival date & time: 06/03/22  0403     History  Chief Complaint  Patient presents with   Abdominal Pain    Washington Whedbee is a 47 y.o. male.  The history is provided by the patient and medical records.  Abdominal Pain Azriel Jakob is a 47 y.o. male who presents to the Emergency Department complaining of abdominal pain.  He presents to the emergency department for evaluation of abdominal pain that started on Wednesday.  He has a history of chronic pancreatitis states he has epigastric pain and numerous episodes of emesis, 5-10 daily that are bilious in nature.  He states this is similar to prior flareups of his pancreatitis.  No associated diarrhea, fever, shortness of breath, dysuria.  He follows with Dr. Henrene Pastor with GI.  When he does have flareups he takes oxycodone 5 as well as Phenergan and omeprazole.     Home Medications Prior to Admission medications   Medication Sig Start Date End Date Taking? Authorizing Provider  amLODipine (NORVASC) 5 MG tablet TAKE 1 TABLET (5 MG TOTAL) BY MOUTH DAILY. 03/05/22   Martin[place], Betty G, MD  benazepril (LOTENSIN) 40 MG tablet TAKE 1 TABLET BY MOUTH EVERY DAY 03/05/22   [place], Betty G, MD  fluticasone [facility]) 50 MCG/ACT nasal spray PLACE 1 SPRAY INTO BOTH NOSTRILS 2 (TWO) TIMES DAILY 05/01/22   [place], Betty G, MD  hydrOXYzine (ATARAX) 25 MG tablet Take 1 tablet (25 mg total) by mouth 2 (two) times daily as needed for anxiety. 03/20/22   [place], Betty G, MD  KLOR-CON M20 20 MEQ tablet TAKE 1 TABLET BY MOUTH EVERY DAY 02/12/22   [place], Betty G, MD  metoprolol succinate (TOPROL-XL) 100 MG 24 hr tablet TAKE 1 TABLET BY MOUTH EVERY DAY WITH OR IMMEDIATELY FOLLOWING A MEAL 05/01/22   [place], Betty G, MD  mirtazapine (REMERON SOL-TAB) 15 MG disintegrating tablet TAKE 0.5-1 TABLETS (7.5-15 MG TOTAL) BY MOUTH AT BEDTIME. 06/01/22   [place], Betty G, MD  ondansetron (ZOFRAN) 4 MG  tablet Take 1 tablet (4 mg total) by mouth every 4 (four) hours as needed for nausea or vomiting. 06/01/22   Jeanell Sparrow, DO  ondansetron (ZOFRAN-ODT) 4 MG disintegrating tablet Take 1 tablet (4 mg total) by mouth every 8 (eight) hours as needed for nausea or vomiting. 12/03/21   Tedd Sias, PA  oxyCODONE (ROXICODONE) 5 MG immediate release tablet Take 1 tablet (5 mg total) by mouth every 4 (four) hours as needed for severe pain. 05/12/22   Maudie Flakes, MD  oxyCODONE (ROXICODONE) 5 MG immediate release tablet Take 1 tablet (5 mg total) by mouth every 4 (four) hours as needed for up to 5 doses for severe pain. 06/01/22   Jeanell Sparrow, DO  promethazine (PHENERGAN) 25 MG tablet Take 1 tablet (25 mg total) by mouth every 6 (six) hours as needed for nausea or vomiting. 05/14/22   Molpus, John, MD  rosuvastatin (CRESTOR) 10 MG tablet TAKE 1 TABLET BY MOUTH EVERY DAY 03/26/22   Martinique, Betty G, MD      Allergies    Cortisone, Eggs or egg-derived products, Ketorolac, Haldol [haloperidol lactate], Hydrocortisone, Iodinated contrast media, Ketorolac tromethamine, Reglan [metoclopramide], Compazine [prochlorperazine], Droperidol, and Ketamine    Review of Systems   Review of Systems  Gastrointestinal:  Positive for abdominal pain.  All other systems reviewed and are negative.   Physical Exam Updated Vital Signs  BP (!) 149/100   Pulse 65   Temp 98.6 F (37 C) (Oral)   Resp 17   Ht '6\' 1"'$  (1.854 m)   Wt 97.5 kg   SpO2 95%   BMI 28.36 kg/m  Physical Exam Vitals and nursing note reviewed.  Constitutional:      Appearance: He is well-developed.  HENT:     Head: Normocephalic and atraumatic.  Cardiovascular:     Rate and Rhythm: Normal rate and regular rhythm.  Pulmonary:     Effort: Pulmonary effort is normal. No respiratory distress.  Abdominal:     Palpations: Abdomen is soft.     Tenderness: There is no guarding or rebound.     Comments: Mild epigastric to LUQ tenderness   Musculoskeletal:        General: No tenderness.  Skin:    General: Skin is warm and dry.  Neurological:     Mental Status: He is alert and oriented to person, place, and time.  Psychiatric:        Behavior: Behavior normal.     ED Results / Procedures / Treatments   Labs (all labs ordered are listed, but only abnormal results are displayed) Labs Reviewed  COMPREHENSIVE METABOLIC PANEL - Abnormal; Notable for the following components:      Result Value   Potassium 2.8 (*)    CO2 21 (*)    Glucose, Bld 118 (*)    All other components within normal limits  CBC WITH DIFFERENTIAL/PLATELET - Abnormal; Notable for the following components:   WBC 12.5 (*)    Neutro Abs 8.0 (*)    Monocytes Absolute 1.1 (*)    All other components within normal limits  LIPASE, BLOOD    EKG EKG Interpretation  Date/Time:  Sunday June 03 2022 05:32:22 EST Ventricular Rate:  72 PR Interval:  169 QRS Duration: 97 QT Interval:  424 QTC Calculation: 464 R Axis:   41 Text Interpretation: Sinus rhythm Abnormal R-wave progression, early transition Confirmed by Quintella Reichert [phone]) on 06/03/2022 5:35:44 AM  Radiology No results found.  Procedures Procedures    Medications Ordered in ED Medications  fentaNYL (SUBLIMAZE) injection 50 mcg (50 mcg Intravenous Patient Refused/Not Given 06/03/22 0554)  fentaNYL (SUBLIMAZE) injection 50 mcg (50 mcg Intravenous Given 06/03/22 0450)  ondansetron (ZOFRAN) injection 4 mg (4 mg Intravenous Given 06/03/22 0449)  lactated ringers bolus 1,000 mL (0 mLs Intravenous Stopped 06/03/22 0535)  potassium chloride SA (KLOR-CON M) CR tablet 40 mEq (40 mEq Oral Given 06/03/22 0546)  magnesium sulfate IVPB 2 g 50 mL (0 g Intravenous Stopped 06/03/22 0606)  promethazine (PHENERGAN) 12.5 mg in sodium chloride 0.9 % 50 mL IVPB (0 mg Intravenous Stopped 06/03/22 0609)  lactated ringers bolus 1,000 mL (0 mLs Intravenous Stopped 06/03/22 0646)  HYDROmorphone (DILAUDID) injection 1 mg  (1 mg Intravenous Given 06/03/22 0609)    ED Course/ Medical Decision Making/ A&P                           Medical Decision Making Amount and/or Complexity of Data Reviewed Labs: ordered.  Risk Prescription drug management.   Patient with history of chronic pancreatitis here for evaluation of abdominal pain and vomiting.  He is nontoxic-appearing on evaluation with mild tenderness.  He has no peritoneal findings.  Labs with mild leukocytosis and hypokalemia.  He was treated with IV fluids containing potassium, antiemetics and pain medications.  He was also treated with oral  potassium.  He was given a dose of magnesium for possible hypomag in setting of hypokalemia.  After medications in the emergency department patient is feeling improved without recurrent vomiting.  Current clinical picture is not consistent with SBO, serious bacterial infection or decompensated pancreatitis.  Feel he is stable at this point to follow-up with GI.       Final Clinical Impression(s) / ED Diagnoses Final diagnoses:  Other chronic pancreatitis ([place])  Hypokalemia    Rx / DC Orders ED Discharge Orders          Ordered    lactated ringers   Once,   Status:  Discontinued        06/03/22 0538              Quintella Reichert, MD 06/03/22 [phone]

## 2022-06-03 NOTE — ED Triage Notes (Signed)
Patient states he has chronic pancreatitis. Patient states he was seen here on Friday for similar symptoms and has had no relief. Patient states he is having constant pain in the upper left abdomen. Radiates to his back. Pain is rate 7/10. Patient takes oxycodone and phenergan at home.

## 2022-06-04 ENCOUNTER — Encounter (HOSPITAL_BASED_OUTPATIENT_CLINIC_OR_DEPARTMENT_OTHER): Payer: Self-pay | Admitting: Urology

## 2022-06-04 ENCOUNTER — Other Ambulatory Visit: Payer: Self-pay

## 2022-06-04 DIAGNOSIS — F17210 Nicotine dependence, cigarettes, uncomplicated: Secondary | ICD-10-CM | POA: Diagnosis not present

## 2022-06-04 DIAGNOSIS — Z79899 Other long term (current) drug therapy: Secondary | ICD-10-CM | POA: Insufficient documentation

## 2022-06-04 DIAGNOSIS — I10 Essential (primary) hypertension: Secondary | ICD-10-CM | POA: Insufficient documentation

## 2022-06-04 DIAGNOSIS — R112 Nausea with vomiting, unspecified: Secondary | ICD-10-CM | POA: Diagnosis not present

## 2022-06-04 DIAGNOSIS — R1013 Epigastric pain: Secondary | ICD-10-CM | POA: Insufficient documentation

## 2022-06-04 DIAGNOSIS — G8929 Other chronic pain: Secondary | ICD-10-CM | POA: Insufficient documentation

## 2022-06-04 NOTE — ED Triage Notes (Signed)
Pt states continued problems with pancreatitis  Continued N/V/abdominal pain  Took oxy and phenergan at 1200 Has appointment on 1/19 for GI

## 2022-06-05 ENCOUNTER — Emergency Department (HOSPITAL_BASED_OUTPATIENT_CLINIC_OR_DEPARTMENT_OTHER)
Admission: EM | Admit: 2022-06-05 | Discharge: 2022-06-05 | Disposition: A | Discharge status: Home / Self Care | Payer: No Typology Code available for payment source | Attending: Emergency Medicine | Admitting: Emergency Medicine

## 2022-06-05 DIAGNOSIS — G8929 Other chronic pain: Secondary | ICD-10-CM

## 2022-06-05 LAB — BASIC METABOLIC PANEL
Anion gap: 11 (ref 5–15)
BUN: 12 mg/dL (ref 6–20)
CO2: 22 mmol/L (ref 22–32)
Calcium: 8.8 mg/dL — ABNORMAL LOW (ref 8.9–10.3)
Chloride: 105 mmol/L (ref 98–111)
Creatinine, Ser: 0.86 mg/dL (ref 0.61–1.24)
GFR, Estimated: >60 mL/min (ref >60)
Glucose, Bld: 91 mg/dL (ref 70–99)
Potassium: 3.4 mmol/L — ABNORMAL LOW (ref 3.5–5.1)
Sodium: 138 mmol/L (ref 135–145)

## 2022-06-05 LAB — CBC WITH DIFFERENTIAL/PLATELET
Abs Immature Granulocytes: 0.05 10*3/uL (ref 0.00–0.07)
Basophils Absolute: 0.1 10*3/uL (ref 0.0–0.1)
Basophils Relative: 1 %
Eosinophils Absolute: 0.2 10*3/uL (ref 0.0–0.5)
Eosinophils Relative: 2 %
HCT: 44.1 % (ref 39.0–52.0)
Hemoglobin: 15.5 g/dL (ref 13.0–17.0)
Immature Granulocytes: 1 %
Lymphocytes Relative: 28 %
Lymphs Abs: 3 10*3/uL (ref 0.7–4.0)
MCH: 30.2 pg (ref 26.0–34.0)
MCHC: 35.1 g/dL (ref 30.0–36.0)
MCV: 85.8 fL (ref 80.0–100.0)
Monocytes Absolute: 0.9 10*3/uL (ref 0.1–1.0)
Monocytes Relative: 8 %
Neutro Abs: 6.6 10*3/uL (ref 1.7–7.7)
Neutrophils Relative %: 60 %
Platelets: 254 10*3/uL (ref 150–400)
RBC: 5.14 MIL/uL (ref 4.22–5.81)
RDW: 12.6 % (ref 11.5–15.5)
WBC: 10.8 10*3/uL — ABNORMAL HIGH (ref 4.0–10.5)
nRBC: 0 % (ref 0.0–0.2)

## 2022-06-05 LAB — LIPASE, BLOOD: Lipase: 32 U/L (ref 11–51)

## 2022-06-05 MED ORDER — HYDROMORPHONE HCL 1 MG/ML IJ SOLN
1.0000 mg | Freq: Once | INTRAMUSCULAR | Status: AC
  Administered 2022-06-05: 1 mg via INTRAVENOUS
  Filled 2022-06-05: qty 1

## 2022-06-05 MED ORDER — SODIUM CHLORIDE 0.9 % IV SOLN
25.0000 mg | Freq: Once | INTRAVENOUS | Status: AC
  Administered 2022-06-05: 25 mg via INTRAVENOUS
  Filled 2022-06-05: qty 1

## 2022-06-05 MED ORDER — PROMETHAZINE HCL 25 MG/ML IJ SOLN
INTRAMUSCULAR | Status: AC
  Filled 2022-06-05: qty 1

## 2022-06-05 MED ORDER — SODIUM CHLORIDE 0.9 % IV BOLUS
1000.0000 mL | Freq: Once | INTRAVENOUS | Status: AC
  Administered 2022-06-05: 1000 mL via INTRAVENOUS

## 2022-06-05 NOTE — ED Provider Notes (Signed)
[facility] Provider Note: Georgena Spurling, MD, FACEP  CSN: [id number] MRN: [id number] ARRIVAL: 06/04/22 at 2136 ROOM: [place]  Abdominal Pain   HISTORY OF PRESENT ILLNESS  06/05/22 1:32 AM [name] is a 47 y.o. male with chronic pancreatitis and frequent visits to the ED for pain exacerbations.  This is his third visit to the ED in 5 days for similar symptoms.  Specifically he is having epigastric pain which he rates as a 7 out of 10.  He is having associated nausea and vomiting.  He was unable to hold down his Phenergan and oxycodone at home.   Past Medical History:  Diagnosis Date   Chronic abdominal pain    Diverticulitis    Drug-seeking behavior    Hypertension    Kidney stones    Pancreatitis, chronic ([place]) 05/28/2005    Past Surgical History:  Procedure Laterality Date   APPENDECTOMY     CHOLECYSTECTOMY     ERCP N/A 12/30/2019   Procedure: ENDOSCOPIC RETROGRADE CHOLANGIOPANCREATOGRAPHY (ERCP);  Surgeon: Milus Banister, MD;  Location: [facility];  Service: Endoscopy;  Laterality: N/A;   ERCP N/A 03/04/2021   Procedure: ENDOSCOPIC RETROGRADE CHOLANGIOPANCREATOGRAPHY (ERCP);  Surgeon: Carol Ada, MD;  Location: [facility];  Service: Endoscopy;  Laterality: N/A;   ERCP N/A 12/13/2021   Procedure: ENDOSCOPIC RETROGRADE CHOLANGIOPANCREATOGRAPHY (ERCP);  Surgeon: Ladene Artist, MD;  Location: [facility];  Service: Gastroenterology;  Laterality: N/A;   ERCP W/ METAL STENT PLACEMENT     KIDNEY STONE SURGERY     REMOVAL OF STONES  12/30/2019   Procedure: REMOVAL OF STONES;  Surgeon: Milus Banister, MD;  Location: [facility];  Service: Endoscopy;;   REMOVAL OF STONES  03/04/2021   Procedure: REMOVAL OF STONES;  Surgeon: Carol Ada, MD;  Location: [facility];  Service: Endoscopy;;   REMOVAL OF STONES  12/13/2021   Procedure: REMOVAL OF STONES;  Surgeon: Ladene Artist, MD;  Location: [facility];  Service:  Gastroenterology;;   Joan Mayans  03/04/2021   Procedure: Joan Mayans;  Surgeon: Carol Ada, MD;  Location: [facility];  Service: Endoscopy;;    Family History  Problem Relation Age of Onset   Breast cancer Mother    Early death Mother    Hypertension Mother    Heart failure Father    Hypertension Father    Heart disease Father    Hyperlipidemia Father    Pancreatic cancer Paternal Grandmother        possibly   Pancreatic cancer Paternal Aunt    Colon cancer Neg Hx     Social History   Tobacco Use   Smoking status: Every Day    Packs/day: 0.50    Types: Cigarettes   Smokeless tobacco: Never  Vaping Use   Vaping Use: Never used  Substance Use Topics   Alcohol use: Not Currently   Drug use: No    Prior to Admission medications   Medication Sig Start Date End Date Taking? Authorizing Provider  amLODipine (NORVASC) 5 MG tablet TAKE 1 TABLET (5 MG TOTAL) BY MOUTH DAILY. 03/05/22   [place], Betty G, MD  benazepril (LOTENSIN) 40 MG tablet TAKE 1 TABLET BY MOUTH EVERY DAY 03/05/22   [place], Betty G, MD  fluticasone [facility]) 50 MCG/ACT nasal spray PLACE 1 SPRAY INTO BOTH NOSTRILS 2 (TWO) TIMES DAILY 05/01/22   [place], Betty G, MD  hydrOXYzine (ATARAX) 25 MG tablet Take 1 tablet (25 mg total) by mouth 2 (two) times  daily as needed for anxiety. 03/20/22   [place], Betty G, MD  KLOR-CON M20 20 MEQ tablet TAKE 1 TABLET BY MOUTH EVERY DAY 02/12/22   [place], Betty G, MD  metoprolol succinate (TOPROL-XL) 100 MG 24 hr tablet TAKE 1 TABLET BY MOUTH EVERY DAY WITH OR IMMEDIATELY FOLLOWING A MEAL 05/01/22   [place], Betty G, MD  mirtazapine (REMERON SOL-TAB) 15 MG disintegrating tablet TAKE 0.5-1 TABLETS (7.5-15 MG TOTAL) BY MOUTH AT BEDTIME. 06/01/22   [place], Betty G, MD  ondansetron (ZOFRAN) 4 MG tablet Take 1 tablet (4 mg total) by mouth every 4 (four) hours as needed for nausea or vomiting. 06/01/22   Jeanell Sparrow, DO  ondansetron (ZOFRAN-ODT) 4 MG disintegrating tablet Take 1 tablet (4  mg total) by mouth every 8 (eight) hours as needed for nausea or vomiting. 12/03/21   Tedd Sias, PA  oxyCODONE (ROXICODONE) 5 MG immediate release tablet Take 1 tablet (5 mg total) by mouth every 4 (four) hours as needed for severe pain. 05/12/22   Maudie Flakes, MD  oxyCODONE (ROXICODONE) 5 MG immediate release tablet Take 1 tablet (5 mg total) by mouth every 4 (four) hours as needed for up to 5 doses for severe pain. 06/01/22   Jeanell Sparrow, DO  promethazine (PHENERGAN) 25 MG tablet Take 1 tablet (25 mg total) by mouth every 6 (six) hours as needed for nausea or vomiting. 05/14/22   Jsaon Yoo, MD  rosuvastatin (CRESTOR) 10 MG tablet TAKE 1 TABLET BY MOUTH EVERY DAY 03/26/22   [place], Betty G, MD    Allergies Cortisone, Eggs or egg-derived products, Ketorolac, Haldol [haloperidol lactate], Hydrocortisone, Iodinated contrast media, Ketorolac tromethamine, Reglan [metoclopramide], Compazine [prochlorperazine], Droperidol, and Ketamine   REVIEW OF SYSTEMS  Negative except as noted here or in the History of Present Illness.   PHYSICAL EXAMINATION  Initial Vital Signs Blood pressure (!) 176/104, pulse 71, temperature 98 F (36.7 C), temperature source Oral, resp. rate 20, height '6\' 1"'$  (1.854 m), weight 97.5 kg, SpO2 98 %.  Examination General: Well-developed, well-nourished male in no acute distress; appearance consistent with age of record HENT: normocephalic; atraumatic Eyes: Normal appearance Neck: supple Heart: regular rate and rhythm Lungs: clear to auscultation bilaterally Abdomen: soft; nondistended; epigastric tenderness; bowel sounds present Extremities: No deformity; full range of motion Neurologic: Awake, alert and oriented; motor function intact in all extremities and symmetric; no facial droop Skin: Warm and dry Psychiatric: Flat affect   RESULTS  Summary of this visit's results, reviewed and interpreted by myself:   EKG Interpretation  Date/Time:     Ventricular Rate:    PR Interval:    QRS Duration:   QT Interval:    QTC Calculation:   R Axis:     Text Interpretation:         Laboratory Studies: Results for orders placed or performed during the hospital encounter of 06/05/22 (from the past 24 hour(s))  Basic metabolic panel     Status: Abnormal   Collection Time: 06/05/22  1:51 AM  Result Value Ref Range   Sodium 138 135 - 145 mmol/L   Potassium 3.4 (L) 3.5 - 5.1 mmol/L   Chloride 105 98 - 111 mmol/L   CO2 22 22 - 32 mmol/L   Glucose, Bld 91 70 - 99 mg/dL   BUN 12 6 - 20 mg/dL   Creatinine, Ser 0.86 0.61 - 1.24 mg/dL   Calcium 8.8 (L) 8.9 - 10.3 mg/dL   GFR, Estimated >60 >60  mL/min   Anion gap 11 5 - 15  Lipase, blood     Status: None   Collection Time: 06/05/22  1:51 AM  Result Value Ref Range   Lipase 32 11 - 51 U/L  CBC with Differential     Status: Abnormal   Collection Time: 06/05/22  1:51 AM  Result Value Ref Range   WBC 10.8 (H) 4.0 - 10.5 K/uL   RBC 5.14 4.22 - 5.81 MIL/uL   Hemoglobin 15.5 13.0 - 17.0 g/dL   HCT 44.1 39.0 - 52.0 %   MCV 85.8 80.0 - 100.0 fL   MCH 30.2 26.0 - 34.0 pg   MCHC 35.1 30.0 - 36.0 g/dL   RDW 12.6 11.5 - 15.5 %   Platelets 254 150 - 400 K/uL   nRBC 0.0 0.0 - 0.2 %   Neutrophils Relative % 60 %   Neutro Abs 6.6 1.7 - 7.7 K/uL   Lymphocytes Relative 28 %   Lymphs Abs 3.0 0.7 - 4.0 K/uL   Monocytes Relative 8 %   Monocytes Absolute 0.9 0.1 - 1.0 K/uL   Eosinophils Relative 2 %   Eosinophils Absolute 0.2 0.0 - 0.5 K/uL   Basophils Relative 1 %   Basophils Absolute 0.1 0.0 - 0.1 K/uL   Immature Granulocytes 1 %   Abs Immature Granulocytes 0.05 0.00 - 0.07 K/uL   Imaging Studies: No results found.  ED COURSE and MDM  Nursing notes, initial and subsequent vitals signs, including pulse oximetry, reviewed and interpreted by myself.  Vitals:   06/04/22 2141 06/04/22 2142 06/05/22 0200 06/05/22 0230  BP:  (!) 176/104 (!) 156/99 (!) 152/94  Pulse:  71 63 63  Resp:  20     Temp:  98 F (36.7 C)    TempSrc:  Oral    SpO2:  98% 92% 94%  Weight: 97.5 kg     Height: '6\' 1"'$  (1.854 m)      Medications  promethazine (PHENERGAN) 25 MG/ML injection (has no administration in time range)  promethazine (PHENERGAN) 25 mg in sodium chloride 0.9 % 50 mL IVPB (has no administration in time range)  HYDROmorphone (DILAUDID) injection 1 mg (has no administration in time range)  sodium chloride 0.9 % bolus 1,000 mL (1,000 mLs Intravenous New Bag/Given 06/05/22 0155)  promethazine (PHENERGAN) 25 mg in sodium chloride 0.9 % 50 mL IVPB (25 mg Intravenous New Bag/Given 06/05/22 0158)  HYDROmorphone (DILAUDID) injection 1 mg (1 mg Intravenous Given 06/05/22 0154)   2:46 AM Patient's symptoms have improved and he states he is ready to be discharged after another dose of medications.   PROCEDURES  Procedures   ED DIAGNOSES     ICD-10-CM   1. Chronic abdominal pain  R10.9    G89.29          [name], Leonard Reichmann, MD 06/05/22 [id number]

## 2022-06-13 ENCOUNTER — Encounter: Payer: Self-pay | Admitting: Family Medicine

## 2022-06-14 ENCOUNTER — Emergency Department (HOSPITAL_BASED_OUTPATIENT_CLINIC_OR_DEPARTMENT_OTHER)
Admission: EM | Admit: 2022-06-14 | Discharge: 2022-06-14 | Disposition: A | Discharge status: Home / Self Care | Payer: No Typology Code available for payment source | Attending: Emergency Medicine | Admitting: Emergency Medicine

## 2022-06-14 ENCOUNTER — Encounter (HOSPITAL_BASED_OUTPATIENT_CLINIC_OR_DEPARTMENT_OTHER): Payer: Self-pay | Admitting: Emergency Medicine

## 2022-06-14 DIAGNOSIS — R112 Nausea with vomiting, unspecified: Secondary | ICD-10-CM | POA: Diagnosis not present

## 2022-06-14 DIAGNOSIS — Z79899 Other long term (current) drug therapy: Secondary | ICD-10-CM | POA: Diagnosis not present

## 2022-06-14 DIAGNOSIS — G8929 Other chronic pain: Secondary | ICD-10-CM | POA: Insufficient documentation

## 2022-06-14 DIAGNOSIS — I10 Essential (primary) hypertension: Secondary | ICD-10-CM | POA: Insufficient documentation

## 2022-06-14 DIAGNOSIS — R1013 Epigastric pain: Secondary | ICD-10-CM | POA: Insufficient documentation

## 2022-06-14 DIAGNOSIS — R109 Unspecified abdominal pain: Secondary | ICD-10-CM | POA: Diagnosis present

## 2022-06-14 LAB — CBC WITH DIFFERENTIAL/PLATELET
Abs Immature Granulocytes: 0.04 10*3/uL (ref 0.00–0.07)
Basophils Absolute: 0.1 10*3/uL (ref 0.0–0.1)
Basophils Relative: 1 %
Eosinophils Absolute: 0.3 10*3/uL (ref 0.0–0.5)
Eosinophils Relative: 3 %
HCT: 40.6 % (ref 39.0–52.0)
Hemoglobin: 14.6 g/dL (ref 13.0–17.0)
Immature Granulocytes: 0 %
Lymphocytes Relative: 26 %
Lymphs Abs: 2.7 10*3/uL (ref 0.7–4.0)
MCH: 30.7 pg (ref 26.0–34.0)
MCHC: 36 g/dL (ref 30.0–36.0)
MCV: 85.3 fL (ref 80.0–100.0)
Monocytes Absolute: 1.2 10*3/uL — ABNORMAL HIGH (ref 0.1–1.0)
Monocytes Relative: 12 %
Neutro Abs: 5.9 10*3/uL (ref 1.7–7.7)
Neutrophils Relative %: 58 %
Platelets: 206 10*3/uL (ref 150–400)
RBC: 4.76 MIL/uL (ref 4.22–5.81)
RDW: 12.3 % (ref 11.5–15.5)
WBC: 10.2 10*3/uL (ref 4.0–10.5)
nRBC: 0 % (ref 0.0–0.2)

## 2022-06-14 LAB — COMPREHENSIVE METABOLIC PANEL
ALT: 26 U/L (ref 0–44)
AST: 22 U/L (ref 15–41)
Albumin: 4.1 g/dL (ref 3.5–5.0)
Alkaline Phosphatase: 75 U/L (ref 38–126)
Anion gap: 7 (ref 5–15)
BUN: 20 mg/dL (ref 6–20)
CO2: 22 mmol/L (ref 22–32)
Calcium: 8.9 mg/dL (ref 8.9–10.3)
Chloride: 108 mmol/L (ref 98–111)
Creatinine, Ser: 0.7 mg/dL (ref 0.61–1.24)
GFR, Estimated: >60 mL/min (ref >60)
Glucose, Bld: 93 mg/dL (ref 70–99)
Potassium: 3.5 mmol/L (ref 3.5–5.1)
Sodium: 137 mmol/L (ref 135–145)
Total Bilirubin: 0.3 mg/dL (ref 0.3–1.2)
Total Protein: 7 g/dL (ref 6.5–8.1)

## 2022-06-14 LAB — LIPASE, BLOOD: Lipase: 41 U/L (ref 11–51)

## 2022-06-14 LAB — MAGNESIUM: Magnesium: 2 mg/dL (ref 1.7–2.4)

## 2022-06-14 MED ORDER — PROMETHAZINE HCL 25 MG RE SUPP: 25.0000 mg | Freq: Four times a day (QID) | RECTAL | 0 refills | Status: DC | PRN

## 2022-06-14 MED ORDER — HYDROMORPHONE HCL 1 MG/ML IJ SOLN
1.0000 mg | Freq: Once | INTRAMUSCULAR | Status: AC
  Administered 2022-06-14: 1 mg via INTRAVENOUS
  Filled 2022-06-14: qty 1

## 2022-06-14 MED ORDER — FAMOTIDINE IN NACL 20-0.9 MG/50ML-% IV SOLN
20.0000 mg | Freq: Once | INTRAVENOUS | Status: AC
  Administered 2022-06-14: 20 mg via INTRAVENOUS
  Filled 2022-06-14: qty 50

## 2022-06-14 MED ORDER — LACTATED RINGERS IV BOLUS
1000.0000 mL | Freq: Once | INTRAVENOUS | Status: AC
  Administered 2022-06-14: 1000 mL via INTRAVENOUS

## 2022-06-14 MED ORDER — SODIUM CHLORIDE 0.9 % IV SOLN
25.0000 mg | Freq: Once | INTRAVENOUS | Status: AC
  Administered 2022-06-14: 25 mg via INTRAVENOUS
  Filled 2022-06-14: qty 1

## 2022-06-14 MED ORDER — PROMETHAZINE HCL 25 MG/ML IJ SOLN
INTRAMUSCULAR | Status: AC
  Filled 2022-06-14: qty 1

## 2022-06-14 NOTE — ED Provider Notes (Signed)
[facility] Provider Note   CSN: [id number] Arrival date & time: 06/14/22  0865     History  Chief Complaint  Patient presents with   Abdominal Pain    [name] is a 47 y.o. male.  Patient is a 47 year old male with a past medical history of hypertension, recurrent pancreatitis/chronic abdominal pain presenting to the emergency department with abdominal pain, nausea and vomiting.  The patient states that he has been having a bout of pancreatitis for the past 3 weeks.  States that he has been in and out of the ER and over the last 3 days she has pain significantly worsened with increased nausea and vomiting.  He denies any fevers or chills.  Denies any diarrhea or constipation, dysuria or hematuria.  He states this feels similar to prior pancreatitis flares in the past.  He states that he was supposed to see his GI doctor last week but had to be rescheduled and has an appointment next week.  The history is provided by the patient.  Abdominal Pain      Home Medications Prior to Admission medications   Medication Sig Start Date End Date Taking? Authorizing Provider  promethazine (PHENERGAN) 25 MG suppository Place 1 suppository (25 mg total) rectally every 6 (six) hours as needed for nausea or vomiting. 06/14/22  Yes Maylon Peppers, [place] K, DO  amLODipine (NORVASC) 5 MG tablet TAKE 1 TABLET (5 MG TOTAL) BY MOUTH DAILY. 03/05/22   [place], Betty G, MD  benazepril (LOTENSIN) 40 MG tablet TAKE 1 TABLET BY MOUTH EVERY DAY 03/05/22   [place], Betty G, MD  fluticasone [facility]) 50 MCG/ACT nasal spray PLACE 1 SPRAY INTO BOTH NOSTRILS 2 (TWO) TIMES DAILY 05/01/22   [place], Betty G, MD  hydrOXYzine (ATARAX) 25 MG tablet Take 1 tablet (25 mg total) by mouth 2 (two) times daily as needed for anxiety. 03/20/22   [place], Betty G, MD  KLOR-CON M20 20 MEQ tablet TAKE 1 TABLET BY MOUTH EVERY DAY 02/12/22   [place], Betty G, MD  metoprolol succinate (TOPROL-XL) 100 MG 24  hr tablet TAKE 1 TABLET BY MOUTH EVERY DAY WITH OR IMMEDIATELY FOLLOWING A MEAL 05/01/22   [place], Betty G, MD  mirtazapine (REMERON SOL-TAB) 15 MG disintegrating tablet TAKE 0.5-1 TABLETS (7.5-15 MG TOTAL) BY MOUTH AT BEDTIME. 06/01/22   [place], Betty G, MD  ondansetron (ZOFRAN) 4 MG tablet Take 1 tablet (4 mg total) by mouth every 4 (four) hours as needed for nausea or vomiting. 06/01/22   Jeanell Sparrow, DO  ondansetron (ZOFRAN-ODT) 4 MG disintegrating tablet Take 1 tablet (4 mg total) by mouth every 8 (eight) hours as needed for nausea or vomiting. 12/03/21   Tedd Sias, PA  oxyCODONE (ROXICODONE) 5 MG immediate release tablet Take 1 tablet (5 mg total) by mouth every 4 (four) hours as needed for severe pain. 05/12/22   Maudie Flakes, MD  oxyCODONE (ROXICODONE) 5 MG immediate release tablet Take 1 tablet (5 mg total) by mouth every 4 (four) hours as needed for up to 5 doses for severe pain. 06/01/22   Jeanell Sparrow, DO  promethazine (PHENERGAN) 25 MG tablet Take 1 tablet (25 mg total) by mouth every 6 (six) hours as needed for nausea or vomiting. 05/14/22   [place], Jenny Reichmann, MD  rosuvastatin (CRESTOR) 10 MG tablet TAKE 1 TABLET BY MOUTH EVERY DAY 03/26/22   [place], Betty G, MD      Allergies    Cortisone, Eggs or  egg-derived products, Ketorolac, Haldol [haloperidol lactate], Hydrocortisone, Iodinated contrast media, Ketorolac tromethamine, Reglan [metoclopramide], Compazine [prochlorperazine], Droperidol, and Ketamine    Review of Systems   Review of Systems  Gastrointestinal:  Positive for abdominal pain.    Physical Exam Updated Vital Signs BP (!) 145/114   Pulse (!) 50   Temp 98.3 F (36.8 C) (Oral)   Resp 12   Ht '6\' 1"'$  (1.854 m)   Wt 97.5 kg   SpO2 93%   BMI 28.37 kg/m  Physical Exam Vitals and nursing note reviewed.  Constitutional:      General: He is not in acute distress.    Appearance: He is well-developed.  HENT:     Head: Normocephalic and atraumatic.     Mouth/Throat:      Mouth: Mucous membranes are moist.     Pharynx: Oropharynx is clear.  Eyes:     Extraocular Movements: Extraocular movements intact.  Cardiovascular:     Rate and Rhythm: Normal rate and regular rhythm.     Heart sounds: Normal heart sounds.  Pulmonary:     Effort: Pulmonary effort is normal.     Breath sounds: Normal breath sounds.  Abdominal:     General: Abdomen is flat.     Palpations: Abdomen is soft.     Tenderness: There is abdominal tenderness in the epigastric area. There is no guarding or rebound.  Skin:    General: Skin is warm and dry.  Neurological:     General: No focal deficit present.     Mental Status: He is alert and oriented to person, place, and time.  Psychiatric:        Mood and Affect: Mood normal.        Behavior: Behavior normal.     ED Results / Procedures / Treatments   Labs (all labs ordered are listed, but only abnormal results are displayed) Labs Reviewed  CBC WITH DIFFERENTIAL/PLATELET - Abnormal; Notable for the following components:      Result Value   Monocytes Absolute 1.2 (*)    All other components within normal limits  COMPREHENSIVE METABOLIC PANEL  LIPASE, BLOOD  MAGNESIUM    EKG EKG Interpretation  Date/Time:  Thursday June 14 2022 08:24:22 EST Ventricular Rate:  68 PR Interval:  174 QRS Duration: 97 QT Interval:  425 QTC Calculation: 452 R Axis:   51 Text Interpretation: Sinus rhythm No significant change since last tracing Confirmed by Leanord Asal ([id number]) on 06/14/2022 8:41:40 AM  Radiology No results found.  Procedures Procedures    Medications Ordered in ED Medications  promethazine (PHENERGAN) 25 MG/ML injection (  Not Given 06/14/22 0931)  lactated ringers bolus 1,000 mL (0 mLs Intravenous Stopped 06/14/22 1100)  famotidine (PEPCID) IVPB 20 mg premix (0 mg Intravenous Stopped 06/14/22 1014)  HYDROmorphone (DILAUDID) injection 1 mg (1 mg Intravenous Given 06/14/22 0812)  promethazine (PHENERGAN) 25 mg  in sodium chloride 0.9 % 50 mL IVPB (0 mg Intravenous Stopped 06/14/22 1014)  HYDROmorphone (DILAUDID) injection 1 mg (1 mg Intravenous Given 06/14/22 1027)    ED Course/ Medical Decision Making/ A&P Clinical Course as of 06/14/22 1157  Thu Jun 14, 2022  0938 Labs within normal range without signs of biliary obstruction or infection. [VK]  1000 Patient reports improvement of nausea but no change in pain. He continues to be well appearing in bed. He will be given 2nd dose of dilaudid and will be reassessed for disposition. [VK]  8088 Upon reassessment, the patient was asleep  in bed resting comfortably.  He states that his pain significantly improved and he feels comfortable with discharge home.  He reports that he has GI follow-up next week and was recommended to follow-up as planned.  He was given strict return precautions. [VK]    Clinical Course User Index [VK] Kemper Durie, DO                             Medical Decision Making This patient presents to the ED with chief complaint(s) of abdominal pain, nausea, vomiting with pertinent past medical history of neck abdominal pain/pancreatitis, hypertension which further complicates the presenting complaint. The complaint involves an extensive differential diagnosis and also carries with it a high risk of complications and morbidity.    The differential diagnosis includes pancreatitis, gastritis, GERD, hepatitis, choledocholithiasis, chronic pain syndrome, dehydration, electrolyte abnormality  Additional history obtained: Additional history obtained from N/A Records reviewed previous admission documents, [place], and recent ED visits  ED Course and Reassessment: Patient is here for his fourth visit in the last month for his abdominal pain with last CT scan performed about 2 months ago that showed no acute intra-abdominal abnormalities.  Patient's last GI visit was in August when he had an MRCP that did show some sludge  and stones within the biliary duct.  Patient will have repeat labs and be given pain and nausea control and will be closely reassessed.  This seems consistent with his chronic abdominal pain/pancreatitis.  Independent labs interpretation:  The following labs were independently interpreted: Within normal range  Independent visualization of imaging: N/A  Consultation: - Consulted or discussed management/test interpretation w/ external professional: N/A  Consideration for admission or further workup: Patient has no emergent conditions requiring admission or further work-up at this time and is stable for discharge home with primary care follow-up  Social Determinants of health: N/A    Amount and/or Complexity of Data Reviewed Labs: ordered.  Risk Prescription drug management.          Final Clinical Impression(s) / ED Diagnoses Final diagnoses:  Chronic epigastric pain    Rx / DC Orders ED Discharge Orders          Ordered    promethazine (PHENERGAN) 25 MG suppository  Every 6 hours PRN        06/14/22 [place], [place], DO 06/14/22 1157

## 2022-06-14 NOTE — ED Triage Notes (Signed)
Pt is c/o pancreatitis  Pt states he is having nausea, vomiting and abd pain

## 2022-06-14 NOTE — Discharge Instructions (Addendum)
You were seen in the emergency department for your abdominal pain.  You had no signs of severe infection or dehydration.  Your pain is likely due to your chronic abdominal pain.  You can continue to take Tylenol and your antacids as needed for your pain and I have given you a refill of your nausea medication that you can take as needed.  You need to follow-up with your GI doctor in the next few days and keep your appointment next week as scheduled to have your symptoms rechecked and for further management of your chronic recurrent pain so that you do not need to continually come to the emergency department for your pain crises.  You should return to the emergency department if you have fevers, repetitive vomiting despite the nausea medication, you have severe pain or pain that is different from her chronic pain or if you have any other new or concerning symptoms.

## 2022-06-15 ENCOUNTER — Emergency Department (HOSPITAL_BASED_OUTPATIENT_CLINIC_OR_DEPARTMENT_OTHER)
Admission: EM | Admit: 2022-06-15 | Discharge: 2022-06-15 | Disposition: A | Discharge status: Home / Self Care | Payer: No Typology Code available for payment source | Attending: Emergency Medicine | Admitting: Emergency Medicine

## 2022-06-15 ENCOUNTER — Encounter (HOSPITAL_BASED_OUTPATIENT_CLINIC_OR_DEPARTMENT_OTHER): Payer: Self-pay

## 2022-06-15 DIAGNOSIS — R112 Nausea with vomiting, unspecified: Secondary | ICD-10-CM | POA: Diagnosis not present

## 2022-06-15 DIAGNOSIS — R1010 Upper abdominal pain, unspecified: Secondary | ICD-10-CM | POA: Insufficient documentation

## 2022-06-15 DIAGNOSIS — D72829 Elevated white blood cell count, unspecified: Secondary | ICD-10-CM | POA: Insufficient documentation

## 2022-06-15 DIAGNOSIS — Z79899 Other long term (current) drug therapy: Secondary | ICD-10-CM | POA: Insufficient documentation

## 2022-06-15 DIAGNOSIS — I10 Essential (primary) hypertension: Secondary | ICD-10-CM | POA: Diagnosis not present

## 2022-06-15 DIAGNOSIS — G8929 Other chronic pain: Secondary | ICD-10-CM

## 2022-06-15 LAB — CBC WITH DIFFERENTIAL/PLATELET
Abs Immature Granulocytes: 0.07 10*3/uL (ref 0.00–0.07)
Basophils Absolute: 0.1 10*3/uL (ref 0.0–0.1)
Basophils Relative: 1 %
Eosinophils Absolute: 0.3 10*3/uL (ref 0.0–0.5)
Eosinophils Relative: 3 %
HCT: 44.5 % (ref 39.0–52.0)
Hemoglobin: 15.7 g/dL (ref 13.0–17.0)
Immature Granulocytes: 1 %
Lymphocytes Relative: 25 %
Lymphs Abs: 2.9 10*3/uL (ref 0.7–4.0)
MCH: 30.2 pg (ref 26.0–34.0)
MCHC: 35.3 g/dL (ref 30.0–36.0)
MCV: 85.6 fL (ref 80.0–100.0)
Monocytes Absolute: 1.1 10*3/uL — ABNORMAL HIGH (ref 0.1–1.0)
Monocytes Relative: 9 %
Neutro Abs: 7.1 10*3/uL (ref 1.7–7.7)
Neutrophils Relative %: 61 %
Platelets: 216 10*3/uL (ref 150–400)
RBC: 5.2 MIL/uL (ref 4.22–5.81)
RDW: 12.4 % (ref 11.5–15.5)
WBC: 11.6 10*3/uL — ABNORMAL HIGH (ref 4.0–10.5)
nRBC: 0 % (ref 0.0–0.2)

## 2022-06-15 LAB — COMPREHENSIVE METABOLIC PANEL
ALT: 33 U/L (ref 0–44)
AST: 29 U/L (ref 15–41)
Albumin: 4.5 g/dL (ref 3.5–5.0)
Alkaline Phosphatase: 97 U/L (ref 38–126)
Anion gap: 10 (ref 5–15)
BUN: 22 mg/dL — ABNORMAL HIGH (ref 6–20)
CO2: 25 mmol/L (ref 22–32)
Calcium: 9.4 mg/dL (ref 8.9–10.3)
Chloride: 103 mmol/L (ref 98–111)
Creatinine, Ser: 0.95 mg/dL (ref 0.61–1.24)
GFR, Estimated: >60 mL/min (ref >60)
Glucose, Bld: 116 mg/dL — ABNORMAL HIGH (ref 70–99)
Potassium: 3 mmol/L — ABNORMAL LOW (ref 3.5–5.1)
Sodium: 138 mmol/L (ref 135–145)
Total Bilirubin: 0.5 mg/dL (ref 0.3–1.2)
Total Protein: 7.8 g/dL (ref 6.5–8.1)

## 2022-06-15 LAB — LIPASE, BLOOD: Lipase: 47 U/L (ref 11–51)

## 2022-06-15 MED ORDER — PROMETHAZINE HCL 25 MG RE SUPP: 25.0000 mg | Freq: Four times a day (QID) | RECTAL | 0 refills | Status: AC | PRN

## 2022-06-15 MED ORDER — ACETAMINOPHEN 500 MG PO TABS
1000.0000 mg | ORAL_TABLET | Freq: Once | ORAL | Status: AC
  Administered 2022-06-15: 1000 mg via ORAL
  Filled 2022-06-15: qty 2

## 2022-06-15 MED ORDER — SODIUM CHLORIDE 0.9 % IV SOLN
25.0000 mg | Freq: Once | INTRAVENOUS | Status: AC
  Administered 2022-06-15: 25 mg via INTRAVENOUS
  Filled 2022-06-15: qty 1

## 2022-06-15 MED ORDER — PROMETHAZINE HCL 25 MG/ML IJ SOLN
INTRAMUSCULAR | Status: AC
  Filled 2022-06-15: qty 1

## 2022-06-15 MED ORDER — POTASSIUM CHLORIDE 10 MEQ/100ML IV SOLN
10.0000 meq | Freq: Once | INTRAVENOUS | Status: AC
  Administered 2022-06-15: 10 meq via INTRAVENOUS
  Filled 2022-06-15: qty 100

## 2022-06-15 MED ORDER — POTASSIUM CHLORIDE CRYS ER 20 MEQ PO TBCR
40.0000 meq | EXTENDED_RELEASE_TABLET | Freq: Once | ORAL | Status: AC
  Administered 2022-06-15: 40 meq via ORAL
  Filled 2022-06-15: qty 2

## 2022-06-15 MED ORDER — OXYCODONE HCL 5 MG PO TABS
5.0000 mg | ORAL_TABLET | Freq: Once | ORAL | Status: AC
  Administered 2022-06-15: 5 mg via ORAL
  Filled 2022-06-15: qty 1

## 2022-06-15 NOTE — Discharge Instructions (Addendum)
Your labs looked good except a slight decrease in your potassium Please follow up with your GI doctor outpatient to help get you on a good regimen for home I am prescribing rectal phenergan for you to take as needed if you start to have vomiting where you can't tolerate taking your medicines

## 2022-06-15 NOTE — ED Provider Notes (Signed)
[facility] Provider Note   CSN: [id number] Arrival date & time: 06/15/22  0254     History  Chief Complaint  Patient presents with   Abdominal Pain    [name] is a 47 y.o. male Pmh of HTN, chronic pancreatitis, frequent ED visits for pain exacerbation here for upper abdominal pain, nausea and vomiting for the past two days. He says he has had a "flare" for the past month. He has oxycodone at home and phenergan but was unable to tolerate this due to emesis. He says it was non-bloody emesis. Has been having regular BM (watery at baseline due to pt being s/p cholecystectomy). Denies any fevers but has been feeling clammy.   Abdominal Pain Associated symptoms: nausea and vomiting   Associated symptoms: no chest pain, no chills, no constipation, no cough, no diarrhea, no dysuria, no fever, no hematuria, no shortness of breath and no sore throat        Home Medications Prior to Admission medications   Medication Sig Start Date End Date Taking? Authorizing Provider  amLODipine (NORVASC) 5 MG tablet TAKE 1 TABLET (5 MG TOTAL) BY MOUTH DAILY. 03/05/22   Martinique, Betty G, MD  benazepril (LOTENSIN) 40 MG tablet TAKE 1 TABLET BY MOUTH EVERY DAY 03/05/22   Martin[place], Betty G, MD  fluticasone [facility]) 50 MCG/ACT nasal spray PLACE 1 SPRAY INTO BOTH NOSTRILS 2 (TWO) TIMES DAILY 05/01/22   Martin[place], Betty G, MD  hydrOXYzine (ATARAX) 25 MG tablet Take 1 tablet (25 mg total) by mouth 2 (two) times daily as needed for anxiety. 03/20/22   [place], Betty G, MD  KLOR-CON M20 20 MEQ tablet TAKE 1 TABLET BY MOUTH EVERY DAY 02/12/22   [place], Betty G, MD  metoprolol succinate (TOPROL-XL) 100 MG 24 hr tablet TAKE 1 TABLET BY MOUTH EVERY DAY WITH OR IMMEDIATELY FOLLOWING A MEAL 05/01/22   [place], Betty G, MD  mirtazapine (REMERON SOL-TAB) 15 MG disintegrating tablet TAKE 0.5-1 TABLETS (7.5-15 MG TOTAL) BY MOUTH AT BEDTIME. 06/01/22   [place], Betty G, MD  ondansetron (ZOFRAN) 4 MG  tablet Take 1 tablet (4 mg total) by mouth every 4 (four) hours as needed for nausea or vomiting. 06/01/22   Jeanell Sparrow, DO  ondansetron (ZOFRAN-ODT) 4 MG disintegrating tablet Take 1 tablet (4 mg total) by mouth every 8 (eight) hours as needed for nausea or vomiting. 12/03/21   Tedd Sias, PA  oxyCODONE (ROXICODONE) 5 MG immediate release tablet Take 1 tablet (5 mg total) by mouth every 4 (four) hours as needed for severe pain. 05/12/22   Maudie Flakes, MD  oxyCODONE (ROXICODONE) 5 MG immediate release tablet Take 1 tablet (5 mg total) by mouth every 4 (four) hours as needed for up to 5 doses for severe pain. 06/01/22   Jeanell Sparrow, DO  promethazine (PHENERGAN) 25 MG suppository Place 1 suppository (25 mg total) rectally every 6 (six) hours as needed for nausea or vomiting. 06/14/22   Kemper Durie, DO  promethazine (PHENERGAN) 25 MG tablet Take 1 tablet (25 mg total) by mouth every 6 (six) hours as needed for nausea or vomiting. 05/14/22   Molpus, John, MD  rosuvastatin (CRESTOR) 10 MG tablet TAKE 1 TABLET BY MOUTH EVERY DAY 03/26/22   Martinique, Betty G, MD      Allergies    Cortisone, Eggs or egg-derived products, Ketorolac, Haldol [haloperidol lactate], Hydrocortisone, Iodinated contrast media, Ketorolac tromethamine, Reglan [metoclopramide], Compazine [prochlorperazine], Droperidol, and Ketamine  Review of Systems   Review of Systems  Constitutional:  Negative for chills and fever.  HENT:  Negative for ear pain and sore throat.   Eyes:  Negative for pain and visual disturbance.  Respiratory:  Negative for cough and shortness of breath.   Cardiovascular:  Negative for chest pain and palpitations.  Gastrointestinal:  Positive for abdominal pain, nausea and vomiting. Negative for blood in stool, constipation and diarrhea.  Genitourinary:  Negative for dysuria and hematuria.  Musculoskeletal:  Negative for arthralgias and back pain.  Skin:  Negative for color change and rash.   Neurological:  Negative for seizures and syncope.  All other systems reviewed and are negative.   Physical Exam Updated Vital Signs Temp 98.4 F (36.9 C) (Oral)   Resp 18  Physical Exam Vitals and nursing note reviewed.  Constitutional:      General: He is not in acute distress.    Appearance: He is well-developed.  HENT:     Head: Normocephalic and atraumatic.  Eyes:     Conjunctiva/sclera: Conjunctivae normal.  Cardiovascular:     Rate and Rhythm: Normal rate and regular rhythm.     Heart sounds: No murmur heard. Pulmonary:     Effort: Pulmonary effort is normal. No respiratory distress.     Breath sounds: Normal breath sounds.  Abdominal:     Palpations: Abdomen is soft.     Tenderness: There is abdominal tenderness in the right upper quadrant and left upper quadrant.     Comments: Of note patient did not have exquisite pain when deeply palpating with stethoscope/distracted; pain when palapting with hands  Musculoskeletal:        General: No swelling.     Cervical back: Neck supple.  Skin:    General: Skin is warm and dry.     Capillary Refill: Capillary refill takes less than 2 seconds.  Neurological:     Mental Status: He is alert.  Psychiatric:        Mood and Affect: Mood normal.     ED Results / Procedures / Treatments   Labs (all labs ordered are listed, but only abnormal results are displayed) Labs Reviewed - No data to display  EKG None  Radiology No results found.  Procedures Procedures   Medications Ordered in ED Medications - No data to display  ED Course/ Medical Decision Making/ A&P Clinical Course as of 06/15/22 0941  Fri Jun 15, 2022  0737 This is patient's fifth visit for abdominal pain.  Benign evaluations x 4 [CC]  9735 I reevaluated the patient at bedside.  Holding his abdomen.  Hemodynamically stable.  Distractible abdominal pain. Was able to work through his shift last night and came in only after his shift had finished for  recurrence of his abdominal pain.  5 visits over the last week for similar pain with resolution with antiemetics. Had a prolonged conversation with patient regarding concerning behaviors and concerning symptoms.  He states he still does not remember exactly when his follow-up with gastroenterology is: He thinks that sometime in the next week though he is not sure. He has received IV hydromorphone at every visit this month which I believe is exacerbating his chronic pain as his home pain medications are no longer completely controlling his chronic pain. I informed patient that continued IV narcotics has not demonstrated to be of therapeutic benefit to the patient given multiple administrations over the past week without symptomatic resolution and how continued utilization is more likely to  promote further chronic pain exacerbations. Patient will be treated with his home antiemetics and IV for, IV fluid and referred back to GI pending laboratory evaluation to evaluate for recurrence of his cholestasis.  [CC]  0801 WBC(!): 11.6 Baseline [CC]  0819 Potassium(!): 3.0 Will replace.  Otherwise, no evidence of cholestasis. Patient tolerated PO pain meds. To be referrred back to PCP/Pain doctor/GI for ongoing OP care and management. [CC]  [id number] Reevaluated at bedside.  No evidence of cholestasis, hemodynamically stable.  He has been able to tolerate multiple p.o. pain and electrolyte replacements. Exquisitely difficult situation given his history of chronic pain on outpatient medications, 5 visits this week for abdominal pain. He still has some residual pain after administration of his home pain medication.  I I believe this is his chronic pain with exacerbation without any acute pathology identified on extensive history physical exam and ED observation. Strict return precautions reinforced, patient to follow-up with PCP and gastroenterology. [CC]    Clinical Course User Index [CC] Tretha Sciara, MD                              Medical Decision Making 47 year old male with PMH of HTN, chronic pancreatitis, frequent ED visits for pain exacerbation here for upper abdominal pain, nausea and vomiting for the past two days.  Patient has not been able to tolerate his antiemetics and pain medications.  Given IV fluid, IV Phenergan and oral dose of home oxycodone. Continue IV narcotics are not therapeutic for patient which was discussed with him by Dr. Oswald Hillock. CBC with mild leukocytosis 11.6, potassium 3.0 - repleted with IV and oral supplementation. Lipase not elevated. Patient hemodynamically stable and tolerating PO. Discharged home with rectal phenergan in addition to oral to help with nausea at home and he may take his home oxycodone. Believe most likely chronic pain exacerbation and no acute intraabdominal pathology based on labs, HPI and physical examination.    Final Clinical Impression(s) / ED Diagnoses Final diagnoses:  None    Rx / DC Orders ED Discharge Orders     None         Gerrit Heck, MD 06/15/22 6195    Tretha Sciara, MD 06/15/22 1334

## 2022-06-15 NOTE — ED Triage Notes (Signed)
Pt seen yesterday for abdominal pain. Pain increased about 2am. Vomited x 1

## 2022-06-15 NOTE — ED Notes (Signed)
Was DC from [facility] yesterday, pain was at a 5 on 0-10 scale, states last night pain began to worsen and has had N/V, last vomiting episode approx 1 hour prior to ED arrival. Abd very tender to palpation, facial grimacing noted intermittently, unable to stand upright completely due to abd pain. Placed in exam room 2, VS obtained, IV established, IVF initiated, ED MD informed of pt arrival

## 2022-06-16 ENCOUNTER — Encounter (HOSPITAL_BASED_OUTPATIENT_CLINIC_OR_DEPARTMENT_OTHER): Payer: Self-pay | Admitting: Emergency Medicine

## 2022-06-16 ENCOUNTER — Other Ambulatory Visit: Payer: Self-pay

## 2022-06-16 ENCOUNTER — Emergency Department (HOSPITAL_BASED_OUTPATIENT_CLINIC_OR_DEPARTMENT_OTHER)
Admission: EM | Admit: 2022-06-16 | Discharge: 2022-06-17 | Disposition: A | Discharge status: Home / Self Care | Payer: No Typology Code available for payment source | Attending: Emergency Medicine | Admitting: Emergency Medicine

## 2022-06-16 DIAGNOSIS — R109 Unspecified abdominal pain: Secondary | ICD-10-CM | POA: Insufficient documentation

## 2022-06-16 DIAGNOSIS — G8929 Other chronic pain: Secondary | ICD-10-CM | POA: Diagnosis not present

## 2022-06-16 DIAGNOSIS — R1110 Vomiting, unspecified: Secondary | ICD-10-CM | POA: Insufficient documentation

## 2022-06-16 DIAGNOSIS — Z79899 Other long term (current) drug therapy: Secondary | ICD-10-CM | POA: Insufficient documentation

## 2022-06-16 MED ORDER — PROMETHAZINE HCL 25 MG/ML IJ SOLN
25.0000 mg | Freq: Four times a day (QID) | INTRAMUSCULAR | Status: DC | PRN
  Administered 2022-06-17: 25 mg via INTRAMUSCULAR
  Filled 2022-06-16: qty 1

## 2022-06-16 NOTE — ED Triage Notes (Signed)
  Patient comes in with LUQ pain that has been going on for 1 week.  Patient has hx of pancreatitis.  Patient states he has had 6+ episodes of emesis and cannot keep his home medications down.  Pain 8/10, sharp.

## 2022-06-17 NOTE — ED Provider Notes (Signed)
[facility]  Provider Note  CSN: [id number] Arrival date & time: 06/16/22 2259  History Chief Complaint  Patient presents with   Abdominal Pain   Emesis    Dearies Meikle is a 47 y.o. male with history of chronic abdominal pain, multiple ED visits including seven in the last 2 weeks and 4 in the last 2 days. He was seen at the [facility] ED earlier today for same, labs have been unremarkable at each visit. He has phenergan and oxycodone at home but reports he has been vomiting too much to keep them down. No fevers. No change from his usual presentation. Scheduled to see GI in a few days.   Home Medications Prior to Admission medications   Medication Sig Start Date End Date Taking? Authorizing Provider  amLODipine (NORVASC) 5 MG tablet TAKE 1 TABLET (5 MG TOTAL) BY MOUTH DAILY. 03/05/22   Martinique, Betty G, MD  benazepril (LOTENSIN) 40 MG tablet TAKE 1 TABLET BY MOUTH EVERY DAY 03/05/22   [place], Betty G, MD  fluticasone [facility]) 50 MCG/ACT nasal spray PLACE 1 SPRAY INTO BOTH NOSTRILS 2 (TWO) TIMES DAILY 05/01/22   [place], Betty G, MD  hydrOXYzine (ATARAX) 25 MG tablet Take 1 tablet (25 mg total) by mouth 2 (two) times daily as needed for anxiety. 03/20/22   [place], Betty G, MD  KLOR-CON M20 20 MEQ tablet TAKE 1 TABLET BY MOUTH EVERY DAY 02/12/22   [place], Betty G, MD  metoprolol succinate (TOPROL-XL) 100 MG 24 hr tablet TAKE 1 TABLET BY MOUTH EVERY DAY WITH OR IMMEDIATELY FOLLOWING A MEAL 05/01/22   [place], Betty G, MD  mirtazapine (REMERON SOL-TAB) 15 MG disintegrating tablet TAKE 0.5-1 TABLETS (7.5-15 MG TOTAL) BY MOUTH AT BEDTIME. 06/01/22   [place], Betty G, MD  ondansetron (ZOFRAN) 4 MG tablet Take 1 tablet (4 mg total) by mouth every 4 (four) hours as needed for nausea or vomiting. 06/01/22   Jeanell Sparrow, DO  ondansetron (ZOFRAN-ODT) 4 MG disintegrating tablet Take 1 tablet (4 mg total) by mouth every 8 (eight) hours as needed for nausea or  vomiting. 12/03/21   Tedd Sias, PA  oxyCODONE (ROXICODONE) 5 MG immediate release tablet Take 1 tablet (5 mg total) by mouth every 4 (four) hours as needed for severe pain. 05/12/22   Maudie Flakes, MD  oxyCODONE (ROXICODONE) 5 MG immediate release tablet Take 1 tablet (5 mg total) by mouth every 4 (four) hours as needed for up to 5 doses for severe pain. 06/01/22   Jeanell Sparrow, DO  promethazine (PHENERGAN) 25 MG suppository Place 1 suppository (25 mg total) rectally every 6 (six) hours as needed for nausea or vomiting. 06/14/22   Kemper Durie, DO  promethazine (PHENERGAN) 25 MG suppository Place 1 suppository (25 mg total) rectally every 6 (six) hours as needed for nausea or vomiting. 06/15/22   Gerrit Heck, MD  promethazine (PHENERGAN) 25 MG tablet Take 1 tablet (25 mg total) by mouth every 6 (six) hours as needed for nausea or vomiting. 05/14/22   Molpus, John, MD  rosuvastatin (CRESTOR) 10 MG tablet TAKE 1 TABLET BY MOUTH EVERY DAY 03/26/22   Martin[place], Betty G, MD     Allergies    Cortisone, Eggs or egg-derived products, Ketorolac, Haldol [haloperidol lactate], Hydrocortisone, Iodinated contrast media, Ketorolac tromethamine, Reglan [metoclopramide], Compazine [prochlorperazine], Droperidol, and Ketamine   Review of Systems   Review of Systems Please see HPI for pertinent positives and negatives  Physical Exam BP (!) 162/108 (BP Location: Right Arm)   Pulse 100   Temp 98.4 F (36.9 C) (Oral)   Resp 20   Ht '6\' 1"'$  (1.854 m)   Wt 96.6 kg   SpO2 96%   BMI 28.10 kg/m   Physical Exam Vitals and nursing note reviewed.  Constitutional:      Appearance: Normal appearance.  HENT:     Head: Normocephalic and atraumatic.     Nose: Nose normal.     Mouth/Throat:     Mouth: Mucous membranes are moist.  Eyes:     Extraocular Movements: Extraocular movements intact.     Conjunctiva/sclera: Conjunctivae normal.  Cardiovascular:     Rate and Rhythm: Normal rate.   Pulmonary:     Effort: Pulmonary effort is normal.     Breath sounds: Normal breath sounds.  Abdominal:     General: Abdomen is flat.     Palpations: Abdomen is soft.     Tenderness: There is abdominal tenderness in the epigastric area. There is no guarding. Negative signs include Murphy's sign and McBurney's sign.  Musculoskeletal:        General: No swelling. Normal range of motion.     Cervical back: Neck supple.  Skin:    General: Skin is warm and dry.  Neurological:     General: No focal deficit present.     Mental Status: He is alert.  Psychiatric:        Mood and Affect: Mood normal.     ED Results / Procedures / Treatments   EKG None  Procedures Procedures  Medications Ordered in the ED Medications  promethazine (PHENERGAN) injection 25 mg (25 mg Intramuscular Given 06/17/22 0029)    Initial Impression and Plan  Patient here with continued exacerbation of chronic abdominal pain. He has been in the ED already once today for same and had negative labs, no indication to repeat at this visit. As with previous visits, I do not feel that parenteral opiates are beneficial and may further exacerbate his symptoms. He does not appear to be clinically dehydrated and vitals are reassuring. Will give IM phenergan as this is the only antiemetic he can tolerate.   ED Course   Clinical Course as of 06/17/22 0043  Sun Jun 17, 2022  0040 Patient has expressed frustration that his symptoms have not been well controlled now for several weeks or months and that his GI doctors are 'blowing him off'. I explained the function of the ED in identifying emergent conditions and that in his case with multiple recent negative lab evaluations there is no emergent medical condition or indication for admission identified today. He is requesting to be discharged now.  [CS]    Clinical Course User Index [CS] Truddie Hidden, MD     MDM Rules/Calculators/A&P Medical Decision Making Problems  Addressed: Chronic abdominal pain: chronic illness or injury with exacerbation, progression, or side effects of treatment  Amount and/or Complexity of Data Reviewed External Data Reviewed: labs and notes.  Risk Prescription drug management.     Final Clinical Impression(s) / ED Diagnoses Final diagnoses:  Chronic abdominal pain    Rx / DC Orders ED Discharge Orders     None        Truddie Hidden, MD 06/17/22 [phone]

## 2022-06-20 ENCOUNTER — Other Ambulatory Visit: Payer: Self-pay

## 2022-06-20 ENCOUNTER — Encounter (HOSPITAL_BASED_OUTPATIENT_CLINIC_OR_DEPARTMENT_OTHER): Payer: Self-pay

## 2022-06-20 ENCOUNTER — Emergency Department (HOSPITAL_BASED_OUTPATIENT_CLINIC_OR_DEPARTMENT_OTHER)
Admission: EM | Admit: 2022-06-20 | Discharge: 2022-06-21 | Disposition: A | Discharge status: Home / Self Care | Payer: No Typology Code available for payment source | Attending: Emergency Medicine | Admitting: Emergency Medicine

## 2022-06-20 DIAGNOSIS — G8929 Other chronic pain: Secondary | ICD-10-CM | POA: Diagnosis not present

## 2022-06-20 DIAGNOSIS — K861 Other chronic pancreatitis: Secondary | ICD-10-CM | POA: Diagnosis not present

## 2022-06-20 DIAGNOSIS — E876 Hypokalemia: Secondary | ICD-10-CM

## 2022-06-20 DIAGNOSIS — R109 Unspecified abdominal pain: Secondary | ICD-10-CM | POA: Insufficient documentation

## 2022-06-20 LAB — CBC WITH DIFFERENTIAL/PLATELET
Abs Immature Granulocytes: 0.08 10*3/uL — ABNORMAL HIGH (ref 0.00–0.07)
Basophils Absolute: 0.1 10*3/uL (ref 0.0–0.1)
Basophils Relative: 0 %
Eosinophils Absolute: 0.3 10*3/uL (ref 0.0–0.5)
Eosinophils Relative: 2 %
HCT: 41.2 % (ref 39.0–52.0)
Hemoglobin: 14.9 g/dL (ref 13.0–17.0)
Immature Granulocytes: 0 %
Lymphocytes Relative: 14 %
Lymphs Abs: 2.5 10*3/uL (ref 0.7–4.0)
MCH: 30.4 pg (ref 26.0–34.0)
MCHC: 36.2 g/dL — ABNORMAL HIGH (ref 30.0–36.0)
MCV: 84.1 fL (ref 80.0–100.0)
Monocytes Absolute: 1.4 10*3/uL — ABNORMAL HIGH (ref 0.1–1.0)
Monocytes Relative: 7 %
Neutro Abs: 14.5 10*3/uL — ABNORMAL HIGH (ref 1.7–7.7)
Neutrophils Relative %: 77 %
Platelets: 183 10*3/uL (ref 150–400)
RBC: 4.9 MIL/uL (ref 4.22–5.81)
RDW: 12.5 % (ref 11.5–15.5)
WBC: 18.9 10*3/uL — ABNORMAL HIGH (ref 4.0–10.5)
nRBC: 0 % (ref 0.0–0.2)

## 2022-06-20 LAB — COMPREHENSIVE METABOLIC PANEL
ALT: 24 U/L (ref 0–44)
AST: 25 U/L (ref 15–41)
Albumin: 3.8 g/dL (ref 3.5–5.0)
Alkaline Phosphatase: 72 U/L (ref 38–126)
Anion gap: 8 (ref 5–15)
BUN: 8 mg/dL (ref 6–20)
CO2: 24 mmol/L (ref 22–32)
Calcium: 8.8 mg/dL — ABNORMAL LOW (ref 8.9–10.3)
Chloride: 106 mmol/L (ref 98–111)
Creatinine, Ser: 0.76 mg/dL (ref 0.61–1.24)
GFR, Estimated: >60 mL/min (ref >60)
Glucose, Bld: 95 mg/dL (ref 70–99)
Potassium: 3.3 mmol/L — ABNORMAL LOW (ref 3.5–5.1)
Sodium: 138 mmol/L (ref 135–145)
Total Bilirubin: 0.4 mg/dL (ref 0.3–1.2)
Total Protein: 6.9 g/dL (ref 6.5–8.1)

## 2022-06-20 LAB — LIPASE, BLOOD: Lipase: 34 U/L (ref 11–51)

## 2022-06-20 MED ORDER — SODIUM CHLORIDE 0.9 % IV BOLUS
1000.0000 mL | Freq: Once | INTRAVENOUS | Status: AC
  Administered 2022-06-20: 1000 mL via INTRAVENOUS

## 2022-06-20 MED ORDER — HYDROMORPHONE HCL 1 MG/ML IJ SOLN
0.5000 mg | Freq: Once | INTRAMUSCULAR | Status: AC
  Administered 2022-06-21: 0.5 mg via INTRAVENOUS
  Filled 2022-06-20: qty 1

## 2022-06-20 MED ORDER — HYDROMORPHONE HCL 1 MG/ML IJ SOLN
0.5000 mg | Freq: Once | INTRAMUSCULAR | Status: AC
  Administered 2022-06-20: 0.5 mg via INTRAVENOUS
  Filled 2022-06-20: qty 1

## 2022-06-20 MED ORDER — ONDANSETRON HCL 4 MG/2ML IJ SOLN
4.0000 mg | Freq: Once | INTRAMUSCULAR | Status: AC
  Administered 2022-06-21: 4 mg via INTRAVENOUS
  Filled 2022-06-20: qty 2

## 2022-06-20 MED ORDER — PROMETHAZINE HCL 25 MG/ML IJ SOLN
INTRAMUSCULAR | Status: AC
  Filled 2022-06-20: qty 1

## 2022-06-20 MED ORDER — SODIUM CHLORIDE 0.9 % IV SOLN
25.0000 mg | Freq: Once | INTRAVENOUS | Status: AC
  Administered 2022-06-20: 25 mg via INTRAVENOUS
  Filled 2022-06-20: qty 1

## 2022-06-20 NOTE — ED Provider Notes (Signed)
[place] [facility] Provider Note   CSN: [id number] Arrival date & time: 06/20/22  2151     History  Chief Complaint  Patient presents with   Abdominal Pain    [name] is a 47 y.o. male.  47 yo M with a chief complaints of abdominal discomfort.  He tells me he is unfortunately had issues with this off and on for the past month.  Has had trouble getting under control.  Has been using his home medicines and sometimes they work and sometimes they do not.  He does not feel like his symptoms are any different than they typically are with his chronic pancreatitis.  He is seeing Dr. Henrene Pastor here who is reportedly referring him to a pancreatic specialist in [place].   Abdominal Pain      Home Medications Prior to Admission medications   Medication Sig Start Date End Date Taking? Authorizing Provider  amLODipine (NORVASC) 5 MG tablet TAKE 1 TABLET (5 MG TOTAL) BY MOUTH DAILY. 03/05/22   Martin[place], Betty G, MD  benazepril (LOTENSIN) 40 MG tablet TAKE 1 TABLET BY MOUTH EVERY DAY 03/05/22   [place], Betty G, MD  fluticasone [facility]) 50 MCG/ACT nasal spray PLACE 1 SPRAY INTO BOTH NOSTRILS 2 (TWO) TIMES DAILY 05/01/22   [place], Betty G, MD  hydrOXYzine (ATARAX) 25 MG tablet Take 1 tablet (25 mg total) by mouth 2 (two) times daily as needed for anxiety. 03/20/22   [place], Betty G, MD  KLOR-CON M20 20 MEQ tablet TAKE 1 TABLET BY MOUTH EVERY DAY 02/12/22   [place], Betty G, MD  metoprolol succinate (TOPROL-XL) 100 MG 24 hr tablet TAKE 1 TABLET BY MOUTH EVERY DAY WITH OR IMMEDIATELY FOLLOWING A MEAL 05/01/22   [place], Betty G, MD  mirtazapine (REMERON SOL-TAB) 15 MG disintegrating tablet TAKE 0.5-1 TABLETS (7.5-15 MG TOTAL) BY MOUTH AT BEDTIME. 06/01/22   [place], Betty G, MD  ondansetron (ZOFRAN) 4 MG tablet Take 1 tablet (4 mg total) by mouth every 4 (four) hours as needed for nausea or vomiting. 06/01/22   Jeanell Sparrow, DO  ondansetron (ZOFRAN-ODT) 4 MG  disintegrating tablet Take 1 tablet (4 mg total) by mouth every 8 (eight) hours as needed for nausea or vomiting. 12/03/21   Tedd Sias, PA  oxyCODONE (ROXICODONE) 5 MG immediate release tablet Take 1 tablet (5 mg total) by mouth every 4 (four) hours as needed for severe pain. 05/12/22   Maudie Flakes, MD  oxyCODONE (ROXICODONE) 5 MG immediate release tablet Take 1 tablet (5 mg total) by mouth every 4 (four) hours as needed for up to 5 doses for severe pain. 06/01/22   Jeanell Sparrow, DO  promethazine (PHENERGAN) 25 MG suppository Place 1 suppository (25 mg total) rectally every 6 (six) hours as needed for nausea or vomiting. 06/14/22   Kemper Durie, DO  promethazine (PHENERGAN) 25 MG suppository Place 1 suppository (25 mg total) rectally every 6 (six) hours as needed for nausea or vomiting. 06/15/22   Gerrit Heck, MD  promethazine (PHENERGAN) 25 MG tablet Take 1 tablet (25 mg total) by mouth every 6 (six) hours as needed for nausea or vomiting. 05/14/22   Molpus, John, MD  rosuvastatin (CRESTOR) 10 MG tablet TAKE 1 TABLET BY MOUTH EVERY DAY 03/26/22   Martin[place], Betty G, MD      Allergies    Cortisone, Eggs or egg-derived products, Ketorolac, Haldol [haloperidol lactate], Hydrocortisone, Iodinated contrast media, Ketorolac tromethamine, Reglan [metoclopramide],  Compazine [prochlorperazine], Droperidol, and Ketamine    Review of Systems   Review of Systems  Gastrointestinal:  Positive for abdominal pain.    Physical Exam Updated Vital Signs BP (!) 158/110 (BP Location: Left Arm)   Pulse (!) 120   Temp 98.8 F (37.1 C)   Resp 18   Ht '6\' 1"'$  (1.854 m)   Wt 97.5 kg   SpO2 98%   BMI 28.37 kg/m  Physical Exam Vitals and nursing note reviewed.  Constitutional:      Appearance: He is well-developed.  HENT:     Head: Normocephalic and atraumatic.  Eyes:     Pupils: Pupils are equal, round, and reactive to light.  Neck:     Vascular: No JVD.  Cardiovascular:     Rate and  Rhythm: Normal rate and regular rhythm.     Heart sounds: No murmur heard.    No friction rub. No gallop.  Pulmonary:     Effort: No respiratory distress.     Breath sounds: No wheezing.  Abdominal:     General: There is no distension.     Tenderness: There is abdominal tenderness. There is no guarding or rebound.     Comments: Mild tenderness to the upper abdomen diffusely.  Musculoskeletal:        General: Normal range of motion.     Cervical back: Normal range of motion and neck supple.  Skin:    Coloration: Skin is not pale.     Findings: No rash.  Neurological:     Mental Status: He is alert and oriented to person, place, and time.  Psychiatric:        Behavior: Behavior normal.     ED Results / Procedures / Treatments   Labs (all labs ordered are listed, but only abnormal results are displayed) Labs Reviewed  CBC WITH DIFFERENTIAL/PLATELET - Abnormal; Notable for the following components:      Result Value   WBC 18.9 (*)    MCHC 36.2 (*)    Neutro Abs 14.5 (*)    Monocytes Absolute 1.4 (*)    Abs Immature Granulocytes 0.08 (*)    All other components within normal limits  COMPREHENSIVE METABOLIC PANEL  LIPASE, BLOOD    EKG None  Radiology No results found.  Procedures Procedures    Medications Ordered in ED Medications  HYDROmorphone (DILAUDID) injection 0.5 mg (has no administration in time range)  promethazine (PHENERGAN) 25 mg in sodium chloride 0.9 % 50 mL IVPB (has no administration in time range)  sodium chloride 0.9 % bolus 1,000 mL (1,000 mLs Intravenous New Bag/Given 06/20/22 2252)    ED Course/ Medical Decision Making/ A&P                             Medical Decision Making Amount and/or Complexity of Data Reviewed Labs: ordered.  Risk Prescription drug management.   47 yo M with the significant past medical history of chronic pancreatitis.  The patient has been seen frequently in our emergency department system all as well as others,  he is 17 visits in the past 6 months in our system was actually seen at the [facility] clinic ER about 3 days ago and was seen by GI then with follow-up in about a month's time.  He has had recurrent lab draws and frequent CT scans.  Will give a single dose of pain and nausea medicine here.  IV fluids.  Repeat  lab work today.  Likely GI follow-up.  Awaiting lab work. Patient care signed out to Dr Ralene Bathe, please see their note for further details of care in the ED.  The patients results and plan were reviewed and discussed.   Any x-rays performed were independently reviewed by myself.   Differential diagnosis were considered with the presenting HPI.  Medications  HYDROmorphone (DILAUDID) injection 0.5 mg (has no administration in time range)  promethazine (PHENERGAN) 25 mg in sodium chloride 0.9 % 50 mL IVPB (has no administration in time range)  sodium chloride 0.9 % bolus 1,000 mL (1,000 mLs Intravenous New Bag/Given 06/20/22 2252)    Vitals:   06/20/22 2155 06/20/22 2200  BP: (!) 158/110   Pulse: (!) 120   Resp: 18   Temp: 98.8 F (37.1 C)   SpO2: 98%   Weight:  97.5 kg  Height:  '6\' 1"'$  (1.854 m)    Final diagnoses:  Chronic abdominal pain    Admission/ observation were discussed with the admitting physician, patient and/or family and they are comfortable with the plan.          Final Clinical Impression(s) / ED Diagnoses Final diagnoses:  Chronic abdominal pain    Rx / DC Orders ED Discharge Orders     None         Deno Etienne, DO 06/20/22 2259

## 2022-06-20 NOTE — ED Triage Notes (Signed)
Pt reports LUQ abd pain/n/v/d ongoing for about a month. Hx of pancreatitis and states this feels similar. Emesis x 5, diarrhea x 2 today.

## 2022-06-21 ENCOUNTER — Other Ambulatory Visit: Payer: Self-pay

## 2022-06-21 ENCOUNTER — Telehealth: Payer: Self-pay

## 2022-06-21 ENCOUNTER — Emergency Department (HOSPITAL_BASED_OUTPATIENT_CLINIC_OR_DEPARTMENT_OTHER)
Admission: EM | Admit: 2022-06-21 | Discharge: 2022-06-22 | Disposition: A | Discharge status: Home / Self Care | Payer: No Typology Code available for payment source | Source: Home / Self Care | Attending: Emergency Medicine | Admitting: Emergency Medicine

## 2022-06-21 DIAGNOSIS — K861 Other chronic pancreatitis: Secondary | ICD-10-CM | POA: Insufficient documentation

## 2022-06-21 DIAGNOSIS — G8929 Other chronic pain: Secondary | ICD-10-CM | POA: Insufficient documentation

## 2022-06-21 DIAGNOSIS — E876 Hypokalemia: Secondary | ICD-10-CM

## 2022-06-21 LAB — CBC
HCT: 44.9 % (ref 39.0–52.0)
Hemoglobin: 15.9 g/dL (ref 13.0–17.0)
MCH: 30.2 pg (ref 26.0–34.0)
MCHC: 35.4 g/dL (ref 30.0–36.0)
MCV: 85.2 fL (ref 80.0–100.0)
Platelets: 211 10*3/uL (ref 150–400)
RBC: 5.27 MIL/uL (ref 4.22–5.81)
RDW: 12.5 % (ref 11.5–15.5)
WBC: 11.2 10*3/uL — ABNORMAL HIGH (ref 4.0–10.5)
nRBC: 0 % (ref 0.0–0.2)

## 2022-06-21 LAB — COMPREHENSIVE METABOLIC PANEL
ALT: 26 U/L (ref 0–44)
AST: 27 U/L (ref 15–41)
Albumin: 4.1 g/dL (ref 3.5–5.0)
Alkaline Phosphatase: 84 U/L (ref 38–126)
Anion gap: 7 (ref 5–15)
BUN: 8 mg/dL (ref 6–20)
CO2: 25 mmol/L (ref 22–32)
Calcium: 9.3 mg/dL (ref 8.9–10.3)
Chloride: 106 mmol/L (ref 98–111)
Creatinine, Ser: 0.86 mg/dL (ref 0.61–1.24)
GFR, Estimated: >60 mL/min (ref >60)
Glucose, Bld: 109 mg/dL — ABNORMAL HIGH (ref 70–99)
Potassium: 3.2 mmol/L — ABNORMAL LOW (ref 3.5–5.1)
Sodium: 138 mmol/L (ref 135–145)
Total Bilirubin: 0.3 mg/dL (ref 0.3–1.2)
Total Protein: 7.5 g/dL (ref 6.5–8.1)

## 2022-06-21 LAB — LIPASE, BLOOD: Lipase: 39 U/L (ref 11–51)

## 2022-06-21 MED ORDER — SODIUM CHLORIDE 0.9 % IV BOLUS
1000.0000 mL | Freq: Once | INTRAVENOUS | Status: AC
  Administered 2022-06-21: 1000 mL via INTRAVENOUS

## 2022-06-21 MED ORDER — HYDROMORPHONE HCL 1 MG/ML IJ SOLN
1.0000 mg | Freq: Once | INTRAMUSCULAR | Status: AC
  Administered 2022-06-21: 1 mg via INTRAMUSCULAR
  Filled 2022-06-21: qty 1

## 2022-06-21 MED ORDER — HYDROMORPHONE HCL 1 MG/ML IJ SOLN
1.0000 mg | Freq: Once | INTRAMUSCULAR | Status: AC
  Administered 2022-06-21: 1 mg via INTRAVENOUS
  Filled 2022-06-21: qty 1

## 2022-06-21 MED ORDER — PROMETHAZINE HCL 25 MG/ML IJ SOLN
INTRAMUSCULAR | Status: AC
  Administered 2022-06-21: 12.5 mg
  Filled 2022-06-21: qty 1

## 2022-06-21 MED ORDER — SODIUM CHLORIDE 0.9 % IV SOLN
12.5000 mg | Freq: Once | INTRAVENOUS | Status: DC
  Filled 2022-06-21: qty 0.5

## 2022-06-21 NOTE — ED Triage Notes (Signed)
Pt presents after being seen last night for same. Hx of pancreatitis. Continues to have n/v and abdominal pain. Has not gotten any better.

## 2022-06-21 NOTE — Telephone Encounter (Signed)
Transition Care Management Unsuccessful Follow-up Telephone Call  Date of discharge and from where:  [facility] 06-20-22 Dx: chronic abdominal pain   Attempts:  1st Attempt  Reason for unsuccessful TCM follow-up call:  Left voice message   Juanda Crumble LPN [facility] Direct Dial [phone]

## 2022-06-21 NOTE — ED Notes (Signed)
Discharge paperwork reviewed entirely with patient, including Rx's and follow up care. Pain was under control. Pt verbalized understanding as well as all parties involved. No questions or concerns voiced at the time of discharge. No acute distress noted.   Pt ambulated out to PVA without incident or assistance.  

## 2022-06-22 NOTE — ED Provider Notes (Signed)
[facility] Provider Note   CSN: [id number] Arrival date & time: 06/21/22  2043     History  Chief Complaint  Patient presents with   Abdominal Pain    [name] is a 47 y.o. male with PMH chronic pancreatitis due to scarring from previous complicated pancreatic stent placement over 10 years ago presenting to ED c/o severe epigastric and left upper quadrant pain. Pt reports history of chronic abdominal pain that is poorly controlled with home pain/nausea medications including oxycodone and Phenergan. Pt reports this is his typical pai, however, he became worried today as pain was more severe than it typically is despite taking his medications and pain became so severe he had an episode where he felt like he was going to pass out. Associated nausea, only one episode of vomiting today. He denies fever, chills, diarrhea, chest pain, shortness of breath, hematemesis, hematochezia, melena, urinary symptoms, back pain, headache, focal weakness, or other acute complaints. He states he has follow up with his local GI specialist next week and has an appointment arrange to see a pancreatic specialist at the end of February. He denies alcohol use, illicit drug use, or history of diabetes. States his last bowel movement was this morning and was normal. Last Phenergan use this AM.      Home Medications Prior to Admission medications   Medication Sig Start Date End Date Taking? Authorizing Provider  amLODipine (NORVASC) 5 MG tablet TAKE 1 TABLET (5 MG TOTAL) BY MOUTH DAILY. 03/05/22   [place], Betty G, MD  benazepril (LOTENSIN) 40 MG tablet TAKE 1 TABLET BY MOUTH EVERY DAY 03/05/22   [place], Betty G, MD  fluticasone [facility]) 50 MCG/ACT nasal spray PLACE 1 SPRAY INTO BOTH NOSTRILS 2 (TWO) TIMES DAILY 05/01/22   [place], Betty G, MD  hydrOXYzine (ATARAX) 25 MG tablet Take 1 tablet (25 mg total) by mouth 2 (two) times daily as needed for anxiety. 03/20/22    [place], Betty G, MD  KLOR-CON M20 20 MEQ tablet TAKE 1 TABLET BY MOUTH EVERY DAY 02/12/22   [place], Betty G, MD  metoprolol succinate (TOPROL-XL) 100 MG 24 hr tablet TAKE 1 TABLET BY MOUTH EVERY DAY WITH OR IMMEDIATELY FOLLOWING A MEAL 05/01/22   [place], Betty G, MD  mirtazapine (REMERON SOL-TAB) 15 MG disintegrating tablet TAKE 0.5-1 TABLETS (7.5-15 MG TOTAL) BY MOUTH AT BEDTIME. 06/01/22   [place], Betty G, MD  ondansetron (ZOFRAN) 4 MG tablet Take 1 tablet (4 mg total) by mouth every 4 (four) hours as needed for nausea or vomiting. 06/01/22   Jeanell Sparrow, DO  ondansetron (ZOFRAN-ODT) 4 MG disintegrating tablet Take 1 tablet (4 mg total) by mouth every 8 (eight) hours as needed for nausea or vomiting. 12/03/21   Tedd Sias, PA  oxyCODONE (ROXICODONE) 5 MG immediate release tablet Take 1 tablet (5 mg total) by mouth every 4 (four) hours as needed for severe pain. 05/12/22   Maudie Flakes, MD  oxyCODONE (ROXICODONE) 5 MG immediate release tablet Take 1 tablet (5 mg total) by mouth every 4 (four) hours as needed for up to 5 doses for severe pain. 06/01/22   Jeanell Sparrow, DO  promethazine (PHENERGAN) 25 MG suppository Place 1 suppository (25 mg total) rectally every 6 (six) hours as needed for nausea or vomiting. 06/14/22   Kemper Durie, DO  promethazine (PHENERGAN) 25 MG suppository Place 1 suppository (25 mg total) rectally every 6 (six) hours as needed for  nausea or vomiting. 06/15/22   Gerrit Heck, MD  promethazine (PHENERGAN) 25 MG tablet Take 1 tablet (25 mg total) by mouth every 6 (six) hours as needed for nausea or vomiting. 05/14/22   Molpus, John, MD  rosuvastatin (CRESTOR) 10 MG tablet TAKE 1 TABLET BY MOUTH EVERY DAY 03/26/22   [place], Betty G, MD      Allergies    Cortisone, Eggs or egg-derived products, Ketorolac, Haldol [haloperidol lactate], Hydrocortisone, Iodinated contrast media, Ketorolac tromethamine, Reglan [metoclopramide], Compazine [prochlorperazine], Droperidol,  and Ketamine    Review of Systems   Review of Systems  Constitutional:  Negative for activity change, appetite change, chills, diaphoresis and fever.  HENT:  Negative for congestion, ear pain, rhinorrhea, sinus pain, sore throat and trouble swallowing.   Eyes:  Negative for pain and visual disturbance.  Respiratory:  Negative for apnea, cough, choking, chest tightness, shortness of breath and wheezing.   Cardiovascular:  Negative for chest pain, palpitations and leg swelling.  Gastrointestinal:  Positive for abdominal pain, nausea and vomiting. Negative for abdominal distention, anal bleeding, blood in stool, constipation, diarrhea and rectal pain.  Endocrine: Negative for polydipsia, polyphagia and polyuria.  Genitourinary:  Negative for decreased urine volume, difficulty urinating, dysuria, flank pain, frequency, hematuria, penile pain, penile swelling, scrotal swelling, testicular pain and urgency.  Musculoskeletal:  Negative for arthralgias, back pain, joint swelling, neck pain and neck stiffness.  Skin:  Negative for color change and rash.  Neurological:  Positive for light-headedness. Negative for seizures, weakness and headaches.  Psychiatric/Behavioral:  Negative for confusion.   All other systems reviewed and are negative.   Physical Exam Updated Vital Signs BP (!) 135/94   Pulse 90   Temp 98.4 F (36.9 C) (Oral)   Resp 16   SpO2 94%  Physical Exam Vitals and nursing note reviewed.  Constitutional:      General: He is not in acute distress.    Appearance: Normal appearance. He is not toxic-appearing or diaphoretic.     Comments: Uncomfortable appearing  HENT:     Head: Normocephalic and atraumatic.     Mouth/Throat:     Mouth: Mucous membranes are dry.  Eyes:     Conjunctiva/sclera: Conjunctivae normal.  Cardiovascular:     Rate and Rhythm: Normal rate and regular rhythm.     Heart sounds: No murmur heard. Pulmonary:     Effort: Pulmonary effort is normal. No  respiratory distress.     Breath sounds: Normal breath sounds. No wheezing, rhonchi or rales.  Abdominal:     General: Abdomen is flat. There is no distension. There are no signs of injury.     Palpations: Abdomen is soft. There is no shifting dullness, fluid wave, hepatomegaly, mass or pulsatile mass.     Tenderness: There is abdominal tenderness (moderate, does not appear distractible) in the epigastric area and left upper quadrant. There is guarding. There is no right CVA tenderness, left CVA tenderness or rebound. Negative signs include Murphy's sign, Rovsing's sign and McBurney's sign.  Musculoskeletal:        General: Normal range of motion.     Cervical back: Neck supple.     Right lower leg: No edema.     Left lower leg: No edema.  Skin:    General: Skin is warm and dry.     Capillary Refill: Capillary refill takes less than 2 seconds.     Coloration: Skin is not jaundiced or pale.     Findings: No rash.  Neurological:     Mental Status: He is alert. Mental status is at baseline.  Psychiatric:        Mood and Affect: Mood is anxious.        Behavior: Behavior normal.     ED Results / Procedures / Treatments   Labs (all labs ordered are listed, but only abnormal results are displayed) Labs Reviewed  COMPREHENSIVE METABOLIC PANEL - Abnormal; Notable for the following components:      Result Value   Potassium 3.2 (*)    Glucose, Bld 109 (*)    All other components within normal limits  CBC - Abnormal; Notable for the following components:   WBC 11.2 (*)    All other components within normal limits  LIPASE, BLOOD  URINALYSIS, ROUTINE W REFLEX MICROSCOPIC    EKG EKG Interpretation  Date/Time:  Thursday June 21 2022 22:33:59 EST Ventricular Rate:  91 PR Interval:  153 QRS Duration: 96 QT Interval:  370 QTC Calculation: 456 R Axis:   57 Text Interpretation: Sinus rhythm Confirmed by Ripley Fraise [phone]) on 06/21/2022 11:31:43 PM  Radiology No results  found.  Procedures Procedures   Medications Ordered in ED Medications  promethazine (PHENERGAN) 12.5 mg in sodium chloride 0.9 % 50 mL IVPB (12.5 mg Intravenous Not Given 06/21/22 2357)  HYDROmorphone (DILAUDID) injection 1 mg (1 mg Intramuscular Given 06/21/22 2248)  sodium chloride 0.9 % bolus 1,000 mL (1,000 mLs Intravenous New Bag/Given 06/21/22 2351)  HYDROmorphone (DILAUDID) injection 1 mg (1 mg Intravenous Given 06/21/22 2352)  promethazine (PHENERGAN) 25 MG/ML injection (12.5 mg  Given 06/21/22 2352)    ED Course/ Medical Decision Making/ A&P                           Medical Decision Making Amount and/or Complexity of Data Reviewed Labs: ordered. Decision-making details documented in ED Course. ECG/medicine tests: ordered. Decision-making details documented in ED Course.  Risk Prescription drug management.   Medical Decision Making:   [name] is a 47 y.o. male who presented to the ED today with abdominal pain, detailed above.    Additional history discussed with patient's family/caregivers.  External chart has been reviewed including recent ED visits. Patient's presentation is complicated by their history of chronic opioid use.  Complete initial physical exam performed, notably the patient was did have notable LUQ and epigastric tenderness on exam with guarding which did not appear distractible on repeat abdominal examinations on my initial exam.     Reviewed and confirmed nursing documentation for past medical history, family history, social history.    Initial Assessment:   With the patient's presentation of abdominal pain, most likely diagnosis is acute on chronic pancreatitis. Other diagnoses were considered including (but not limited to) gastroenteritis, colitis, small bowel obstruction, appendicitis, cholecystitis, nephrolithiasis, UTI, pyelonephritis, opioid use disorder, drug seeking behavior, malingering.    Initial Plan:  CBC/CMP to evaluate for  underlying infectious/metabolic etiology for patient's abdominal pain  Lipase to evaluate for pancreatitis  EKG to evaluate for cardiac source of pain and measure QTc  CTAB/Pelvis with contrast to evaluate for structural/surgical etiology of patients' severe abdominal pain was not ordered at pt request Urinalysis to evaluate for UTI/Pyelonephritis, initially ordered but discontinued ain discussion with pt who had no urinary symptoms Empiric management of symptoms with pain control and antiemetics as needed.   Initial Study Results:   Laboratory  All laboratory results reviewed without evidence of clinically relevant pathology.  Exceptions include: K 3.2, stable with diagnosed history of hypokalemia and on daily supplementation which he has not had today; leukocytosis at 11.2, significant improved from yesterday's visit with reading of 18.9.   EKG EKG was reviewed independently. Rate, rhythm, axis, intervals all examined and without medically relevant abnormality. ST segments without concerns for elevations/acute STEMI. QTc mildly prolonged but stable.  Radiology Discussed option of further imaging with pt who declined. I believe this is reasonable in setting of long-standing chronic abdominal pain of unchanged characteristics.   Final Reassessment and Plan:   47 year old male presenting to ED for evaluation of severe abdominal pain, suspected to be chronic in nature and related to his history of chronic pancreatitis. He does report pain is more severe than usual and has remain unchanged despite home pain medication use. Labs unremarkable, particularly lipase which is normal. Discussed option of imaging with pt who declined due to multiple previous scans, believe this is reasonable. He did appear uncomfortable on my initial exam without signs pain was distractible so small dose IV pain medication was ordered pending workup. Following review of QTc in setting of history of prolongation, this was  stable and dose of Phenergan also given. On re-examination, pt notes pain significantly improved but not resolved. Mucus membranes do appear dry so will add IV fluids. Discussed results including unremarkable workup today with pt/partner. With continued pain, will give one more dose pain medication in addition to fluids but after that pt aware he will be discharged home to follow up with his outpatient team, will receive no more pain medication during today's ED visit, and will not be receiving prescription for pain medication as this is inappropriate from ED setting with no evidence of acute illness. Pt/partner expressed understanding of this plan and gratitude for care received during today's visit. All questions answered, strict ED return precautions given, stable for discharge.        Final Clinical Impression(s) / ED Diagnoses Final diagnoses:  Chronic pancreatitis, unspecified pancreatitis type ([place])  Chronic abdominal pain    Rx / DC Orders ED Discharge Orders     None         Suzzette Righter, PA-C 06/25/22 1830    Tretha Sciara, MD 06/25/22 [phone]

## 2022-06-22 NOTE — Discharge Instructions (Addendum)
Thank you for letting us take care of you today.  As discussed, your lab work and physical exam today were reassuring. I suspect your symptoms today are due to a chronic pain flare. It is very important to follow up regularly with your PCP and GI specialist for better long-term pain control.   Please follow up with your PCP next week as well as your GI specialist to discuss today's visit. I have also provided the contact information for our on call GI doctors should they be easier to arrange outpatient follow up with. Continue to take your home medications as instructed by your outpatient team.  Please return to ED for re-evaluation for any new or worsening concerns.

## 2022-06-23 ENCOUNTER — Encounter (HOSPITAL_BASED_OUTPATIENT_CLINIC_OR_DEPARTMENT_OTHER): Payer: Self-pay | Admitting: Emergency Medicine

## 2022-06-23 ENCOUNTER — Other Ambulatory Visit: Payer: Self-pay

## 2022-06-23 ENCOUNTER — Emergency Department (HOSPITAL_BASED_OUTPATIENT_CLINIC_OR_DEPARTMENT_OTHER)
Admission: EM | Admit: 2022-06-23 | Discharge: 2022-06-23 | Disposition: A | Discharge status: Home / Self Care | Payer: No Typology Code available for payment source | Attending: Emergency Medicine | Admitting: Emergency Medicine

## 2022-06-23 DIAGNOSIS — R1013 Epigastric pain: Secondary | ICD-10-CM | POA: Insufficient documentation

## 2022-06-23 DIAGNOSIS — G8929 Other chronic pain: Secondary | ICD-10-CM | POA: Diagnosis not present

## 2022-06-23 DIAGNOSIS — R1012 Left upper quadrant pain: Secondary | ICD-10-CM | POA: Diagnosis not present

## 2022-06-23 LAB — COMPREHENSIVE METABOLIC PANEL
ALT: 26 U/L (ref 0–44)
AST: 24 U/L (ref 15–41)
Albumin: 4.1 g/dL (ref 3.5–5.0)
Alkaline Phosphatase: 74 U/L (ref 38–126)
Anion gap: 7 (ref 5–15)
BUN: 12 mg/dL (ref 6–20)
CO2: 27 mmol/L (ref 22–32)
Calcium: 8.9 mg/dL (ref 8.9–10.3)
Chloride: 104 mmol/L (ref 98–111)
Creatinine, Ser: 0.84 mg/dL (ref 0.61–1.24)
GFR, Estimated: >60 mL/min (ref >60)
Glucose, Bld: 94 mg/dL (ref 70–99)
Potassium: 3.8 mmol/L (ref 3.5–5.1)
Sodium: 138 mmol/L (ref 135–145)
Total Bilirubin: 0.5 mg/dL (ref 0.3–1.2)
Total Protein: 7.3 g/dL (ref 6.5–8.1)

## 2022-06-23 LAB — CBC WITH DIFFERENTIAL/PLATELET
Abs Immature Granulocytes: 0.07 10*3/uL (ref 0.00–0.07)
Basophils Absolute: 0.1 10*3/uL (ref 0.0–0.1)
Basophils Relative: 1 %
Eosinophils Absolute: 0.3 10*3/uL (ref 0.0–0.5)
Eosinophils Relative: 3 %
HCT: 40.6 % (ref 39.0–52.0)
Hemoglobin: 14.1 g/dL (ref 13.0–17.0)
Immature Granulocytes: 1 %
Lymphocytes Relative: 27 %
Lymphs Abs: 2.6 10*3/uL (ref 0.7–4.0)
MCH: 29.9 pg (ref 26.0–34.0)
MCHC: 34.7 g/dL (ref 30.0–36.0)
MCV: 86.2 fL (ref 80.0–100.0)
Monocytes Absolute: 0.7 10*3/uL (ref 0.1–1.0)
Monocytes Relative: 8 %
Neutro Abs: 5.6 10*3/uL (ref 1.7–7.7)
Neutrophils Relative %: 60 %
Platelets: 189 10*3/uL (ref 150–400)
RBC: 4.71 MIL/uL (ref 4.22–5.81)
RDW: 12.5 % (ref 11.5–15.5)
WBC: 9.3 10*3/uL (ref 4.0–10.5)
nRBC: 0 % (ref 0.0–0.2)

## 2022-06-23 LAB — LIPASE, BLOOD: Lipase: 35 U/L (ref 11–51)

## 2022-06-23 MED ORDER — SODIUM CHLORIDE 0.9 % IV SOLN: INTRAVENOUS | Status: DC | PRN

## 2022-06-23 MED ORDER — ACETAMINOPHEN 500 MG PO TABS
1000.0000 mg | ORAL_TABLET | Freq: Once | ORAL | Status: AC
  Administered 2022-06-23: 1000 mg via ORAL
  Filled 2022-06-23: qty 2

## 2022-06-23 MED ORDER — LACTATED RINGERS IV BOLUS
1000.0000 mL | Freq: Once | INTRAVENOUS | Status: AC
  Administered 2022-06-23: 1000 mL via INTRAVENOUS

## 2022-06-23 MED ORDER — ONDANSETRON 4 MG PO TBDP: 4.0000 mg | ORAL_TABLET | Freq: Three times a day (TID) | ORAL | 0 refills | Status: AC | PRN

## 2022-06-23 MED ORDER — PROMETHAZINE HCL 25 MG/ML IJ SOLN
INTRAMUSCULAR | Status: AC
  Filled 2022-06-23: qty 1

## 2022-06-23 MED ORDER — MORPHINE SULFATE (PF) 4 MG/ML IV SOLN
4.0000 mg | Freq: Once | INTRAVENOUS | Status: AC
  Administered 2022-06-23: 4 mg via INTRAVENOUS
  Filled 2022-06-23: qty 1

## 2022-06-23 MED ORDER — SODIUM CHLORIDE 0.9 % IV SOLN
12.5000 mg | Freq: Four times a day (QID) | INTRAVENOUS | Status: DC | PRN
  Administered 2022-06-23: 12.5 mg via INTRAVENOUS
  Filled 2022-06-23: qty 0.5

## 2022-06-23 NOTE — ED Provider Notes (Signed)
[place] [facility] Provider Note   CSN: [id number] Arrival date & time: 06/23/22  3154     History  Chief Complaint  Patient presents with   Abdominal Pain    [name] is a 47 y.o. male.  With a history of chronic abdominal pain and chronic pancreatitis secondary to pancreatic scarring from a misplaced stent approximately 16 years ago who presents ED for evaluation of epigastric and left upper quadrant abdominal pain.  States he gets episodes like this every few months.  This episode has lasted for greater than 1 month.  It has gotten worse over the past week.  He has been unable to eat or drink for 2 days.  States he is constantly nauseous.  He states that he has not tried any medications at home for this.  He smokes less than 1/2 pack/day.  He denies any alcohol use or recreational drug use.  Denies chest pain or shortness of breath.  Denies constipation or diarrhea.  He is having normal bowel movements.  Denies fevers, chills, urinary symptoms.   Abdominal Pain Associated symptoms: nausea and vomiting        Home Medications Prior to Admission medications   Medication Sig Start Date End Date Taking? Authorizing Provider  ondansetron (ZOFRAN-ODT) 4 MG disintegrating tablet Take 1 tablet (4 mg total) by mouth every 8 (eight) hours as needed for nausea or vomiting. 06/23/22  Yes Datha Kissinger, [place], PA-C  amLODipine (NORVASC) 5 MG tablet TAKE 1 TABLET (5 MG TOTAL) BY MOUTH DAILY. 03/05/22   [place], Betty G, MD  benazepril (LOTENSIN) 40 MG tablet TAKE 1 TABLET BY MOUTH EVERY DAY 03/05/22   [place], Betty G, MD  fluticasone [facility]) 50 MCG/ACT nasal spray PLACE 1 SPRAY INTO BOTH NOSTRILS 2 (TWO) TIMES DAILY 05/01/22   [place], Betty G, MD  hydrOXYzine (ATARAX) 25 MG tablet Take 1 tablet (25 mg total) by mouth 2 (two) times daily as needed for anxiety. 03/20/22   [place], Betty G, MD  KLOR-CON M20 20 MEQ tablet TAKE 1 TABLET BY MOUTH EVERY DAY  02/12/22   [place], Betty G, MD  metoprolol succinate (TOPROL-XL) 100 MG 24 hr tablet TAKE 1 TABLET BY MOUTH EVERY DAY WITH OR IMMEDIATELY FOLLOWING A MEAL 05/01/22   [place], Betty G, MD  mirtazapine (REMERON SOL-TAB) 15 MG disintegrating tablet TAKE 0.5-1 TABLETS (7.5-15 MG TOTAL) BY MOUTH AT BEDTIME. 06/01/22   [place], Betty G, MD  ondansetron (ZOFRAN) 4 MG tablet Take 1 tablet (4 mg total) by mouth every 4 (four) hours as needed for nausea or vomiting. 06/01/22   Jeanell Sparrow, DO  oxyCODONE (ROXICODONE) 5 MG immediate release tablet Take 1 tablet (5 mg total) by mouth every 4 (four) hours as needed for severe pain. 05/12/22   Maudie Flakes, MD  oxyCODONE (ROXICODONE) 5 MG immediate release tablet Take 1 tablet (5 mg total) by mouth every 4 (four) hours as needed for up to 5 doses for severe pain. 06/01/22   Jeanell Sparrow, DO  promethazine (PHENERGAN) 25 MG suppository Place 1 suppository (25 mg total) rectally every 6 (six) hours as needed for nausea or vomiting. 06/14/22   Kemper Durie, DO  promethazine (PHENERGAN) 25 MG suppository Place 1 suppository (25 mg total) rectally every 6 (six) hours as needed for nausea or vomiting. 06/15/22   Gerrit Heck, MD  promethazine (PHENERGAN) 25 MG tablet Take 1 tablet (25 mg total) by mouth every 6 (six) hours  as needed for nausea or vomiting. 05/14/22   Molpus, John, MD  rosuvastatin (CRESTOR) 10 MG tablet TAKE 1 TABLET BY MOUTH EVERY DAY 03/26/22   [place], Betty G, MD      Allergies    Cortisone, Eggs or egg-derived products, Ketorolac, Haldol [haloperidol lactate], Hydrocortisone, Iodinated contrast media, Ketorolac tromethamine, Reglan [metoclopramide], Compazine [prochlorperazine], Droperidol, and Ketamine    Review of Systems   Review of Systems  Gastrointestinal:  Positive for abdominal pain, nausea and vomiting.  All other systems reviewed and are negative.   Physical Exam Updated Vital Signs BP (!) 150/105   Pulse (!) 56   Temp 98.2  F (36.8 C)   Resp 18   SpO2 95%  Physical Exam Vitals and nursing note reviewed.  Constitutional:      General: He is not in acute distress.    Appearance: He is well-developed. He is not ill-appearing, toxic-appearing or diaphoretic.  HENT:     Head: Normocephalic and atraumatic.  Eyes:     Conjunctiva/sclera: Conjunctivae normal.  Cardiovascular:     Rate and Rhythm: Normal rate and regular rhythm.     Heart sounds: No murmur heard. Pulmonary:     Effort: Pulmonary effort is normal. No respiratory distress.     Breath sounds: Normal breath sounds.  Abdominal:     Palpations: Abdomen is soft.     Tenderness: There is abdominal tenderness in the epigastric area and left upper quadrant. There is guarding. Negative signs include McBurney's sign.  Musculoskeletal:        General: No swelling.     Cervical back: Neck supple.  Skin:    General: Skin is warm and dry.     Capillary Refill: Capillary refill takes less than 2 seconds.  Neurological:     Mental Status: He is alert.  Psychiatric:        Mood and Affect: Mood normal.     ED Results / Procedures / Treatments   Labs (all labs ordered are listed, but only abnormal results are displayed) Labs Reviewed  CBC WITH DIFFERENTIAL/PLATELET  COMPREHENSIVE METABOLIC PANEL  LIPASE, BLOOD    EKG None  Radiology No results found.  Procedures Procedures    Medications Ordered in ED Medications  promethazine (PHENERGAN) 12.5 mg in sodium chloride 0.9 % 50 mL IVPB (12.5 mg Intravenous New Bag/Given 06/23/22 1203)  promethazine (PHENERGAN) 25 MG/ML injection (has no administration in time range)  0.9 %  sodium chloride infusion ( Intravenous New Bag/Given 06/23/22 1201)  lactated ringers bolus 1,000 mL ( Intravenous Stopped 06/23/22 1159)  acetaminophen (TYLENOL) tablet 1,000 mg (1,000 mg Oral Given 06/23/22 1046)  morphine (PF) 4 MG/ML injection 4 mg (4 mg Intravenous Given 06/23/22 1153)    ED Course/ Medical Decision  Making/ A&P                             Medical Decision Making Amount and/or Complexity of Data Reviewed Labs: ordered.  Risk OTC drugs.  This patient presents to the ED for concern of chronic abdominal pain, this involves an extensive number of treatment options, and is a complaint that carries with it a high risk of complications and morbidity.  The differential diagnosis for generalized abdominal pain includes, but is not limited to AAA, gastroenteritis, appendicitis, Bowel obstruction, Bowel perforation. Gastroparesis, DKA, Hernia, Inflammatory bowel disease, mesenteric ischemia, pancreatitis, peritonitis SBP, volvulus.   Co morbidities that complicate the patient evaluation  chronic pancreatitis, chronic abdominal pain  My initial workup includes abdominal pain labs, IV Phenergan, fluids, p.o. Tylenol  Additional history obtained from: Nursing notes from this visit. Previous records within EMR system 10 the ED visits for similar in the past 27 days  I ordered, reviewed and interpreted labs which include: CMP, CBC, lipase  Afebrile, initially hypertensive which did improve after interventions. 47 year old male presents the ED for evaluation of epigastric and left upper quadrant abdominal pain.  He has had numerous ED visits frequently for chronic abdominal pain and chronic pancreatitis.  On my exam, he states that he has not taken anything for his symptoms at home because he does not have anything for the symptoms.  On review, he has been sent numerous prescriptions recently for opioid medications, most recently was sent 12 pills of oxycodone 3 days ago.  He does have follow-up with his primary care provider in 1 week and a GI provider in 3 weeks.  He states he also has an appointment with a pancreatic specialist in [place] in February.  After IV fluids, Phenergan and morphine he stated that he did feel better.  He was requesting additional doses just prior to discharge.  This was not  performed as he has been displaying drug-seeking behavior and has opioid pain medication at home.  He was requesting ODT Zofran as the Zofran that he has at home is p.o. and he is oftentimes not able to keep this down.  This will be sent.  He does have a remote history of prolonged QT.  He is encouraged to only take the zofran as needed to avoid cardiac issues.  His labs were completely unremarkable.  Low suspicion for acute abnormalities.  He was encouraged to follow-up with his primary care, GI and pancreatic specialist as possible for chronic management.  Stable at discharge.  At this time there does not appear to be any evidence of an acute emergency medical condition and the patient appears stable for discharge with appropriate outpatient follow up. Diagnosis was discussed with patient who verbalizes understanding of care plan and is agreeable to discharge. I have discussed return precautions with patient who verbalizes understanding. Patient encouraged to follow-up with their PCP within 1 week. All questions answered.  Patient's case discussed with Dr. Nechama Guard who agrees with plan to discharge with follow-up.   Note: Portions of this report may have been transcribed using voice recognition software. Every effort was made to ensure accuracy; however, inadvertent computerized transcription errors may still be present.         Final Clinical Impression(s) / ED Diagnoses Final diagnoses:  Chronic abdominal pain    Rx / DC Orders ED Discharge Orders          Ordered    ondansetron (ZOFRAN-ODT) 4 MG disintegrating tablet  Every 8 hours PRN        06/23/22 1242              Roylene Reason, PA-C 06/23/22 [place], [place], MD 06/24/22 [phone]

## 2022-06-23 NOTE — Discharge Instructions (Addendum)
You have been seen today for your complaint of abdominal pain. Your lab work was reassuring and showed abnormalities. Your discharge medications include your home medications.  I am also sending a prescription for the dissolvable Zofran for your nausea. Follow up with: Your primary care, GI and pancreatic specialist Please seek immediate medical care if you develop any of the following symptoms: Your pain does not go away as soon as your health care provider told you to expect. You cannot stop vomiting. Your pain is only in areas of the abdomen, such as the right side or the left lower portion of the abdomen. Pain on the right side could be caused by appendicitis. You have bloody or black stools, or stools that look like tar. You have severe pain, cramping, or bloating in your abdomen. You have signs of dehydration, such as: Dark urine, very little urine, or no urine. Cracked lips. Dry mouth. Sunken eyes. Sleepiness. Weakness. You have trouble breathing or chest pain. At this time there does not appear to be the presence of an emergent medical condition, however there is always the potential for conditions to change. Please read and follow the below instructions.  Do not take your medicine if  develop an itchy rash, swelling in your mouth or lips, or difficulty breathing; call 911 and seek immediate emergency medical attention if this occurs.  You may review your lab tests and imaging results in their entirety on your MyChart account.  Please discuss all results of fully with your primary care provider and other specialist at your follow-up visit.  Note: Portions of this text may have been transcribed using voice recognition software. Every effort was made to ensure accuracy; however, inadvertent computerized transcription errors may still be present.

## 2022-06-23 NOTE — ED Notes (Signed)
EDP told him to ask to be admitted for pancreatitis since he's had abd pain, and the EDP can not direct admit. LUQ abd pain for the last several weeks.

## 2022-06-23 NOTE — ED Triage Notes (Signed)
States this "bout of abd pain has been for 2 days, with n/v"

## 2022-06-24 ENCOUNTER — Other Ambulatory Visit: Payer: Self-pay

## 2022-06-24 ENCOUNTER — Encounter (HOSPITAL_BASED_OUTPATIENT_CLINIC_OR_DEPARTMENT_OTHER): Payer: Self-pay | Admitting: Emergency Medicine

## 2022-06-24 ENCOUNTER — Emergency Department (HOSPITAL_BASED_OUTPATIENT_CLINIC_OR_DEPARTMENT_OTHER)
Admission: EM | Admit: 2022-06-24 | Discharge: 2022-06-24 | Disposition: A | Discharge status: Home / Self Care | Payer: No Typology Code available for payment source | Attending: Emergency Medicine | Admitting: Emergency Medicine

## 2022-06-24 DIAGNOSIS — G8929 Other chronic pain: Secondary | ICD-10-CM | POA: Insufficient documentation

## 2022-06-24 DIAGNOSIS — R109 Unspecified abdominal pain: Secondary | ICD-10-CM | POA: Diagnosis present

## 2022-06-24 DIAGNOSIS — R1013 Epigastric pain: Secondary | ICD-10-CM | POA: Diagnosis not present

## 2022-06-24 LAB — CBC
HCT: 45.9 % (ref 39.0–52.0)
Hemoglobin: 16.4 g/dL (ref 13.0–17.0)
MCH: 30.3 pg (ref 26.0–34.0)
MCHC: 35.7 g/dL (ref 30.0–36.0)
MCV: 84.8 fL (ref 80.0–100.0)
Platelets: 225 10*3/uL (ref 150–400)
RBC: 5.41 MIL/uL (ref 4.22–5.81)
RDW: 12.5 % (ref 11.5–15.5)
WBC: 12.6 10*3/uL — ABNORMAL HIGH (ref 4.0–10.5)
nRBC: 0 % (ref 0.0–0.2)

## 2022-06-24 LAB — COMPREHENSIVE METABOLIC PANEL
ALT: 28 U/L (ref 0–44)
AST: 24 U/L (ref 15–41)
Albumin: 4.2 g/dL (ref 3.5–5.0)
Alkaline Phosphatase: 82 U/L (ref 38–126)
Anion gap: 9 (ref 5–15)
BUN: 11 mg/dL (ref 6–20)
CO2: 26 mmol/L (ref 22–32)
Calcium: 9.1 mg/dL (ref 8.9–10.3)
Chloride: 102 mmol/L (ref 98–111)
Creatinine, Ser: 0.91 mg/dL (ref 0.61–1.24)
GFR, Estimated: >60 mL/min (ref >60)
Glucose, Bld: 101 mg/dL — ABNORMAL HIGH (ref 70–99)
Potassium: 3.1 mmol/L — ABNORMAL LOW (ref 3.5–5.1)
Sodium: 137 mmol/L (ref 135–145)
Total Bilirubin: 0.5 mg/dL (ref 0.3–1.2)
Total Protein: 7.4 g/dL (ref 6.5–8.1)

## 2022-06-24 LAB — LIPASE, BLOOD: Lipase: 33 U/L (ref 11–51)

## 2022-06-24 NOTE — ED Provider Notes (Signed)
[place] [facility] Provider Note   CSN: [id number] Arrival date & time: 06/24/22  1905     History  Chief Complaint  Patient presents with   Abdominal Pain    [name] is a 47 y.o. male.  Patient is a 47 year old male with past medical history of chronic abdominal pain related to reported chronic pancreatitis.  Patient with history of frequent ER visits with similar complaints.  He denies nausea or vomiting.  He denies any fevers or chills.  He reports no relief with his home medications.  This is now his 12th visit to the ER in the month of January.  The history is provided by the patient.       Home Medications Prior to Admission medications   Medication Sig Start Date End Date Taking? Authorizing Provider  amLODipine (NORVASC) 5 MG tablet TAKE 1 TABLET (5 MG TOTAL) BY MOUTH DAILY. 03/05/22   Martinique, Betty G, MD  benazepril (LOTENSIN) 40 MG tablet TAKE 1 TABLET BY MOUTH EVERY DAY 03/05/22   Martinique, Betty G, MD  fluticasone [facility]) 50 MCG/ACT nasal spray PLACE 1 SPRAY INTO BOTH NOSTRILS 2 (TWO) TIMES DAILY 05/01/22   Martinique, Betty G, MD  hydrOXYzine (ATARAX) 25 MG tablet Take 1 tablet (25 mg total) by mouth 2 (two) times daily as needed for anxiety. 03/20/22   Martinique, Betty G, MD  KLOR-CON M20 20 MEQ tablet TAKE 1 TABLET BY MOUTH EVERY DAY 02/12/22   Martin[place], Betty G, MD  metoprolol succinate (TOPROL-XL) 100 MG 24 hr tablet TAKE 1 TABLET BY MOUTH EVERY DAY WITH OR IMMEDIATELY FOLLOWING A MEAL 05/01/22   Martinique, Betty G, MD  mirtazapine (REMERON SOL-TAB) 15 MG disintegrating tablet TAKE 0.5-1 TABLETS (7.5-15 MG TOTAL) BY MOUTH AT BEDTIME. 06/01/22   Martin[place], Betty G, MD  ondansetron (ZOFRAN) 4 MG tablet Take 1 tablet (4 mg total) by mouth every 4 (four) hours as needed for nausea or vomiting. 06/01/22   Jeanell Sparrow, DO  ondansetron (ZOFRAN-ODT) 4 MG disintegrating tablet Take 1 tablet (4 mg total) by mouth every 8 (eight) hours as needed  for nausea or vomiting. 06/23/22   Schutt, Grafton Folk, PA-C  oxyCODONE (ROXICODONE) 5 MG immediate release tablet Take 1 tablet (5 mg total) by mouth every 4 (four) hours as needed for severe pain. 05/12/22   Maudie Flakes, MD  oxyCODONE (ROXICODONE) 5 MG immediate release tablet Take 1 tablet (5 mg total) by mouth every 4 (four) hours as needed for up to 5 doses for severe pain. 06/01/22   Jeanell Sparrow, DO  promethazine (PHENERGAN) 25 MG suppository Place 1 suppository (25 mg total) rectally every 6 (six) hours as needed for nausea or vomiting. 06/14/22   Kemper Durie, DO  promethazine (PHENERGAN) 25 MG suppository Place 1 suppository (25 mg total) rectally every 6 (six) hours as needed for nausea or vomiting. 06/15/22   Gerrit Heck, MD  promethazine (PHENERGAN) 25 MG tablet Take 1 tablet (25 mg total) by mouth every 6 (six) hours as needed for nausea or vomiting. 05/14/22   Molpus, John, MD  rosuvastatin (CRESTOR) 10 MG tablet TAKE 1 TABLET BY MOUTH EVERY DAY 03/26/22   Martinique, Betty G, MD      Allergies    Cortisone, Eggs or egg-derived products, Ketorolac, Haldol [haloperidol lactate], Hydrocortisone, Iodinated contrast media, Ketorolac tromethamine, Reglan [metoclopramide], Compazine [prochlorperazine], Droperidol, and Ketamine    Review of Systems   Review of Systems  All other systems reviewed and are negative.   Physical Exam Updated Vital Signs BP 127/80   Pulse 69   Temp 99 F (37.2 C) (Oral)   Resp 18   SpO2 96%  Physical Exam Vitals and nursing note reviewed.  Constitutional:      General: He is not in acute distress.    Appearance: He is well-developed. He is not diaphoretic.  HENT:     Head: Normocephalic and atraumatic.  Cardiovascular:     Rate and Rhythm: Normal rate and regular rhythm.     Heart sounds: No murmur heard.    No friction rub.  Pulmonary:     Effort: Pulmonary effort is normal. No respiratory distress.     Breath sounds: Normal breath  sounds. No wheezing or rales.  Abdominal:     General: Bowel sounds are normal. There is no distension.     Palpations: Abdomen is soft.     Tenderness: There is abdominal tenderness in the epigastric area. There is no right CVA tenderness, left CVA tenderness, guarding or rebound.  Musculoskeletal:        General: Normal range of motion.     Cervical back: Normal range of motion and neck supple.  Skin:    General: Skin is warm and dry.  Neurological:     Mental Status: He is alert and oriented to person, place, and time.     Coordination: Coordination normal.     ED Results / Procedures / Treatments   Labs (all labs ordered are listed, but only abnormal results are displayed) Labs Reviewed  COMPREHENSIVE METABOLIC PANEL - Abnormal; Notable for the following components:      Result Value   Potassium 3.1 (*)    Glucose, Bld 101 (*)    All other components within normal limits  CBC - Abnormal; Notable for the following components:   WBC 12.6 (*)    All other components within normal limits  LIPASE, BLOOD  URINALYSIS, ROUTINE W REFLEX MICROSCOPIC    EKG None  Radiology No results found.  Procedures Procedures    Medications Ordered in ED Medications - No data to display  ED Course/ Medical Decision Making/ A&P  Patient is a 47 year old male with reported history of chronic pancreatitis presenting with complaints of abdominal pain.  He presents to the ER frequently with these complaints.  I have seen this patient multiple times over the years.  This is now his 12th visit in the month of January.  Patient's laboratory studies are all unremarkable with the exception of a mild leukocytosis of 12.6.  His lipase once again is normal.  I have discussed this patient's chronic pain and repeat usage of the ER for this in the past.  I explained to him that I am uncomfortable administering him further narcotics for this condition.  Patient is either drug-seeking or is using the ER  inappropriately for his chronic pain needs.  Patient was advised that he needs to follow-up with his primary doctor or gastroenterologist to discuss pain management.  Final Clinical Impression(s) / ED Diagnoses Final diagnoses:  Chronic abdominal pain    Rx / DC Orders ED Discharge Orders     None         Veryl Speak, MD 06/24/22 2319

## 2022-06-24 NOTE — ED Triage Notes (Signed)
Pt returns for continued abd pain and vomiting; seen here yesterday for same

## 2022-06-24 NOTE — Discharge Instructions (Signed)
You need to follow-up with your doctor regarding your chronic pain issues.

## 2022-06-24 NOTE — ED Notes (Signed)
D/c paperwork reviewed with pt, including follow up care.  No questions or concerns voiced at time of d/c. . Pt verbalized understanding, Ambulatory with family to ED exit, NAD.   

## 2022-06-25 NOTE — Progress Notes (Unsigned)
HPI: [name] is a 47 y.o. male, who is here today to follow on recent ED visit. He has been in the ED for similar symptoms this month about 12-13 times. Years of intermittent upper abdominal pain, nausea, and vomiting. He has not identified exacerbating or alleviating factors. Extensive workup. Last abdomen/pelvics CT with contrast on 04/22/2022: No acute findings, persistent pneumobilia suggesting previous sphincterectomy with some decrease in the central biliary ductal dilation.  Aortic atherosclerosis. Follows with gastroenterologist.  Lab Results  Component Value Date   ALT 28 06/24/2022   AST 24 06/24/2022   ALKPHOS 82 06/24/2022   BILITOT 0.5 06/24/2022   Lab Results  Component Value Date   WBC 12.6 (H) 06/24/2022   HGB 16.4 06/24/2022   HCT 45.9 06/24/2022   MCV 84.8 06/24/2022   PLT 225 06/24/2022   He has an appt with GI on 06/28/22.  He has been referred to pain management but he has not received appt information.  Hypertension: Currently he is on amlodipine 5 mg daily, metoprolol succinate 100 mg daily, and benazepril 40 mg daily. Reporting BPs at home 120s/60s when he does not have severe pain. Hypokalemia on K-Lor 20 mK daily. He states that he has decreased eating due to nausea. Negative for unusual or severe headache, visual changes, exertional chest pain, dyspnea,  focal weakness, or edema.  Lab Results  Component Value Date   CREATININE 0.91 06/24/2022   BUN 11 06/24/2022   NA 137 06/24/2022   K 3.1 (L) 06/24/2022   CL 102 06/24/2022   CO2 26 06/24/2022   Hyperlipidemia: Currently he is on rosuvastatin 10 mg daily.  Lab Results  Component Value Date   CHOL 246 (H) 05/16/2021   HDL 47.20 05/16/2021   LDLCALC 113 (H) 06/03/2015   LDLDIRECT 170.0 05/16/2021   TRIG 273.0 (H) 05/16/2021   CHOLHDL 5 05/16/2021   He was last seen on 03/20/2022, when he was complaining of anxiety and insomnia. He is currently on mirtazapine 50 mg daily at  bedtime, which has helped. Sleeping about 5-6 due to work schedule. He has had EKGs in the ER negative for QT prolongation.  Review of Systems  Constitutional:  Positive for fatigue. Negative for chills and fever.  HENT:  Negative for sore throat and trouble swallowing.   Respiratory:  Negative for cough and wheezing.   Gastrointestinal:  Negative for blood in stool.  Endocrine: Negative for cold intolerance and heat intolerance.  Genitourinary:  Negative for decreased urine volume, dysuria and hematuria.  Musculoskeletal:  Negative for gait problem and myalgias.  Skin:  Negative for rash.  Neurological:  Negative for syncope and facial asymmetry.  See other pertinent positives and negatives in HPI.  Current Outpatient Medications on File Prior to Visit  Medication Sig Dispense Refill   amLODipine (NORVASC) 5 MG tablet TAKE 1 TABLET (5 MG TOTAL) BY MOUTH DAILY. 90 tablet 1   benazepril (LOTENSIN) 40 MG tablet TAKE 1 TABLET BY MOUTH EVERY DAY 90 tablet 1   fluticasone (FLONASE) 50 MCG/ACT nasal spray PLACE 1 SPRAY INTO BOTH NOSTRILS 2 (TWO) TIMES DAILY 48 mL 1   hydrOXYzine (ATARAX) 25 MG tablet Take 1 tablet (25 mg total) by mouth 2 (two) times daily as needed for anxiety. 30 tablet 1   KLOR-CON M20 20 MEQ tablet TAKE 1 TABLET BY MOUTH EVERY DAY 90 tablet 1   metoprolol succinate (TOPROL-XL) 100 MG 24 hr tablet TAKE 1 TABLET BY MOUTH EVERY DAY WITH OR  IMMEDIATELY FOLLOWING A MEAL 90 tablet 1   mirtazapine (REMERON SOL-TAB) 15 MG disintegrating tablet TAKE 0.5-1 TABLETS (7.5-15 MG TOTAL) BY MOUTH AT BEDTIME. 30 tablet 0   ondansetron (ZOFRAN) 4 MG tablet Take 1 tablet (4 mg total) by mouth every 4 (four) hours as needed for nausea or vomiting. 5 tablet 0   ondansetron (ZOFRAN-ODT) 4 MG disintegrating tablet Take 1 tablet (4 mg total) by mouth every 8 (eight) hours as needed for nausea or vomiting. 20 tablet 0   promethazine (PHENERGAN) 25 MG suppository Place 1 suppository (25 mg total)  rectally every 6 (six) hours as needed for nausea or vomiting. 12 each 0   promethazine (PHENERGAN) 25 MG tablet Take 1 tablet (25 mg total) by mouth every 6 (six) hours as needed for nausea or vomiting. 20 tablet 0   rosuvastatin (CRESTOR) 10 MG tablet TAKE 1 TABLET BY MOUTH EVERY DAY 90 tablet 1   No current facility-administered medications on file prior to visit.    Past Medical History:  Diagnosis Date   Chronic abdominal pain    Diverticulitis    Drug-seeking behavior    Hypertension    Kidney stones    Pancreatitis, chronic ([place]) 05/28/2005   Allergies  Allergen Reactions   Cortisone Other (See Comments)    drops potassium level "bottoms out" potassium level drops potassium level Bottoms out potassium  "bottoms out" potassium level Other reaction(s): Other (See Comments) Bottoms out potassium   Eggs Or Egg-Derived Products Hives and Other (See Comments)    Rash  Rash   Ketorolac Hives and Other (See Comments)    Hives, slightly labored breathing  Hives, slightly labored breathing   Haldol [Haloperidol Lactate] Other (See Comments)    "jittery"    Hydrocortisone Hives    Also drops potassium level   Iodinated Contrast Media Nausea And Vomiting and Other (See Comments)    Hives Hives Hives Hives   Ketorolac Tromethamine Hives   Reglan [Metoclopramide]     Face "draws" and gets figgety   Compazine [Prochlorperazine] Other (See Comments)    Dystonic reaction   Droperidol Other (See Comments)    Makes face draw up   Ketamine     Patient reports " I am allergic to it."    Social History   Socioeconomic History   Marital status: Married    Spouse name: Not on file   Number of children: 1   Years of education: Not on file   Highest education level: Some college, no degree  Occupational History   Not on file  Tobacco Use   Smoking status: Every Day    Packs/day: 0.50    Types: Cigarettes   Smokeless tobacco: Never  Vaping Use   Vaping Use: Never  used  Substance and Sexual Activity   Alcohol use: Not Currently   Drug use: No   Sexual activity: Not on file  Other Topics Concern   Not on file  Social History Narrative   Not on file   Social Determinants of Health   Financial Resource Strain: Low Risk  (04/03/2021)   Overall Financial Resource Strain (CARDIA)    Difficulty of Paying Living Expenses: Not hard at all  Food Insecurity: No Food Insecurity (04/03/2021)   Hunger Vital Sign    Worried About Running Out of Food in the Last Year: Never true    Ran Out of Food in the Last Year: Never true  Transportation Needs: No Transportation Needs (04/03/2021)   PRAPARE -  Hydrologist (Medical): No    Lack of Transportation (Non-Medical): No  Physical Activity: Insufficiently Active (04/03/2021)   Exercise Vital Sign    Days of Exercise per Week: 1 day    Minutes of Exercise per Session: 20 min  Stress: No Stress Concern Present (04/03/2021)   [place]    Feeling of Stress : Only a little  Social Connections: Socially Integrated (04/03/2021)   Social Connection and Isolation Panel [NHANES]    Frequency of Communication with Friends and Family: Three times a week    Frequency of Social Gatherings with Friends and Family: Once a week    Attends Religious Services: More than 4 times per year    Active Member of [employer] or Organizations: Yes    Attends Archivist Meetings: More than 4 times per year    Marital Status: Married   Vitals:   06/26/22 1134  BP: 128/80  Pulse: 76  Resp: 12  Temp: 98.2 F (36.8 C)  SpO2: 96%   Wt Readings from Last 3 Encounters:  06/26/22 213 lb 2 oz (96.7 kg)  06/20/22 215 lb (97.5 kg)  06/16/22 213 lb (96.6 kg)  Body mass index is 28.12 kg/m.  Physical Exam Vitals and nursing note reviewed.  Constitutional:      General: He is not in acute distress.    Appearance: He is well-developed.   HENT:     Head: Normocephalic and atraumatic.     Mouth/Throat:     Mouth: Mucous membranes are moist.     Dentition: Has dentures.  Eyes:     Conjunctiva/sclera: Conjunctivae normal.  Cardiovascular:     Rate and Rhythm: Normal rate and regular rhythm.     Pulses:          Dorsalis pedis pulses are 2+ on the right side and 2+ on the left side.     Heart sounds: No murmur heard. Pulmonary:     Effort: Pulmonary effort is normal. No respiratory distress.     Breath sounds: Normal breath sounds.  Abdominal:     Palpations: Abdomen is soft. There is no hepatomegaly or mass.     Tenderness: There is abdominal tenderness in the right upper quadrant and periumbilical area. There is no guarding or rebound.  Skin:    General: Skin is warm.     Findings: No erythema or rash.  Neurological:     General: No focal deficit present.     Mental Status: He is alert and oriented to person, place, and time.     Cranial Nerves: No cranial nerve deficit.     Gait: Gait normal.  Psychiatric:        Mood and Affect: Mood and affect normal.   ASSESSMENT AND PLAN:  Mr. Khalil was seen today for follow-up.  Diagnoses and all orders for this visit:  Chronic abdominal pain Assessment & Plan: Thought to be caused by chronic biliary pancreatitis. Offered another referral to pain management but he would like to hold on this until he sees gastroenterologist and Dr. Jerline Pain on 07/24/22. Continue following with gastroenterologist.   Nausea and vomiting in adult Assessment & Plan: This is a chronic problem. Gastroparesis is in the differential diagnosis, he has not tolerated Reglan in the past. Continue Zofran 4 mg 3-day as needed and Phenergan 25 mg 3 times daily as needed. Continue following with gastroenterologist.    Hypertension, essential, benign Assessment &  Plan: Otherwise BP is adequately controlled. Continue Metoprolol succinate 100 mg daily, Amlodipine 5 mg daily,and Benazepril 40 mg  daily as well as low-salt diet. Continue monitoring BP regularly.   Hypokalemia Assessment & Plan: Aggravated most likely by episodes of vomiting and decreased oral intake. Other possible etiologies discussed. We will obtain aldosterone + renin activity with ratio as well as potassium with next blood work. Continue K-Lor 20 mK daily.  Orders: -     Aldosterone + renin activity w/ ratio -     Potassium; Future  Hyperlipidemia, mixed Assessment & Plan: Continue rosuvastatin 10 mg daily and low-fat diet. He will arrange appointment for fasting labs.  Orders: -     Lipid panel; Future  Insomnia, unspecified type Assessment & Plan: Problem has improved. Continue mirtazapine 15 mg daily at bedtime, which has also help with anxiety. Adequate sleep hygiene.   Atherosclerosis of aorta [facility]) Assessment & Plan: He is on rosuvastatin 10 mg daily.   Return in about 6 months (around 12/25/2022).  Octavia Velador G. Martinique, MD  [facility]. [place] office.

## 2022-06-25 NOTE — Telephone Encounter (Signed)
Transition Care Management Unsuccessful Follow-up Telephone Call  Date of discharge and from where:    [facility] 06-20-22 Dx: chronic abdominal pain     Attempts:  2nd Attempt  Reason for unsuccessful TCM follow-up call:  Left voice message   Juanda Crumble LPN [facility] Direct Dial [phone]

## 2022-06-26 ENCOUNTER — Ambulatory Visit (INDEPENDENT_AMBULATORY_CARE_PROVIDER_SITE_OTHER): Payer: No Typology Code available for payment source | Admitting: Family Medicine

## 2022-06-26 ENCOUNTER — Encounter: Payer: Self-pay | Admitting: Family Medicine

## 2022-06-26 VITALS — BP 128/80 | HR 76 | Temp 98.2°F | Resp 12 | Ht 73.0 in | Wt 213.1 lb

## 2022-06-26 DIAGNOSIS — G8929 Other chronic pain: Secondary | ICD-10-CM

## 2022-06-26 DIAGNOSIS — R112 Nausea with vomiting, unspecified: Secondary | ICD-10-CM

## 2022-06-26 DIAGNOSIS — R109 Unspecified abdominal pain: Secondary | ICD-10-CM

## 2022-06-26 DIAGNOSIS — E876 Hypokalemia: Secondary | ICD-10-CM

## 2022-06-26 DIAGNOSIS — E782 Mixed hyperlipidemia: Secondary | ICD-10-CM

## 2022-06-26 DIAGNOSIS — I700 Atherosclerosis of aorta: Secondary | ICD-10-CM

## 2022-06-26 DIAGNOSIS — G4700 Insomnia, unspecified: Secondary | ICD-10-CM

## 2022-06-26 DIAGNOSIS — I10 Essential (primary) hypertension: Secondary | ICD-10-CM | POA: Diagnosis not present

## 2022-06-26 NOTE — Assessment & Plan Note (Signed)
He is on rosuvastatin 10 mg daily.

## 2022-06-26 NOTE — Assessment & Plan Note (Signed)
Otherwise BP is adequately controlled. Continue Metoprolol succinate 100 mg daily, Amlodipine 5 mg daily,and Benazepril 40 mg daily as well as low-salt diet. Continue monitoring BP regularly.

## 2022-06-26 NOTE — Assessment & Plan Note (Signed)
This is a chronic problem. Gastroparesis is in the differential diagnosis, he has not tolerated Reglan in the past. Continue Zofran 4 mg 3-day as needed and Phenergan 25 mg 3 times daily as needed. Continue following with gastroenterologist.

## 2022-06-26 NOTE — Assessment & Plan Note (Signed)
Continue rosuvastatin 10 mg daily and low-fat diet. He will arrange appointment for fasting labs.

## 2022-06-26 NOTE — Assessment & Plan Note (Signed)
Problem has improved. Continue mirtazapine 15 mg daily at bedtime, which has also help with anxiety. Adequate sleep hygiene.

## 2022-06-26 NOTE — Assessment & Plan Note (Signed)
Aggravated most likely by episodes of vomiting and decreased oral intake. Other possible etiologies discussed. We will obtain aldosterone + renin activity with ratio as well as potassium with next blood work. Continue K-Lor 20 mK daily.

## 2022-06-26 NOTE — Assessment & Plan Note (Signed)
Thought to be caused by chronic biliary pancreatitis. Offered another referral to pain management but he would like to hold on this until he sees gastroenterologist and Dr. Jerline Pain on 07/24/22. Continue following with gastroenterologist.

## 2022-06-26 NOTE — Patient Instructions (Addendum)
A few things to remember from today's visit:  Pain of upper abdomen  Nausea and vomiting in adult  Hypertension, essential, benign  Hypokalemia - Plan: Aldosterone + renin activity w/ ratio, Potassium  Hyperlipidemia, mixed - Plan: Lipid panel  Insomnia, unspecified type  Keeps GI appts. Fasting labs when is convenient for you.  If you need refills for medications you take chronically, please call your pharmacy. Do not use My Chart to request refills or for acute issues that need immediate attention. If you send a my chart message, it may take a few days to be addressed, specially if I am not in the office.  Please be sure medication list is accurate. If a new problem present, please set up appointment sooner than planned today.

## 2022-08-03 ENCOUNTER — Other Ambulatory Visit: Payer: Self-pay

## 2022-08-03 MED ORDER — ROSUVASTATIN CALCIUM 10 MG PO TABS: 10.0000 mg | ORAL_TABLET | Freq: Every day | ORAL | 1 refills | Status: DC

## 2022-08-15 LAB — LAB REPORT - SCANNED: EGFR: 90

## 2022-09-13 LAB — LAB REPORT - SCANNED: EGFR: 86

## 2022-10-19 ENCOUNTER — Other Ambulatory Visit: Payer: Self-pay | Admitting: Family Medicine

## 2022-10-19 DIAGNOSIS — I10 Essential (primary) hypertension: Secondary | ICD-10-CM

## 2022-10-20 ENCOUNTER — Other Ambulatory Visit: Payer: Self-pay | Admitting: Family Medicine

## 2022-10-20 DIAGNOSIS — E876 Hypokalemia: Secondary | ICD-10-CM

## 2022-10-27 ENCOUNTER — Other Ambulatory Visit: Payer: Self-pay | Admitting: Family Medicine

## 2022-10-27 DIAGNOSIS — I10 Essential (primary) hypertension: Secondary | ICD-10-CM

## 2022-10-27 DIAGNOSIS — J309 Allergic rhinitis, unspecified: Secondary | ICD-10-CM

## 2022-12-08 IMAGING — CT CT ABD-PELV W/O CM
2 of 4 series · 15 of 46 positions shown, 17 images · non-contrast
Comparison: CT the abdomen and pelvis 04/01/2021.

CLINICAL DATA: 45-year-old male with history of left upper quadrant
abdominal pain since [REDACTED], with some associated nausea and
vomiting.

EXAM:
CT ABDOMEN AND PELVIS WITHOUT CONTRAST
TECHNIQUE: Multidetector CT imaging of the abdomen and pelvis was performed
following the standard protocol without IV contrast.

[Series 2: axial st · axial · 0.97mm/px · z∈[-540,-55]mm · 12 of 111 slices shown, 14 images]
[im 9/111  soft-tissue]
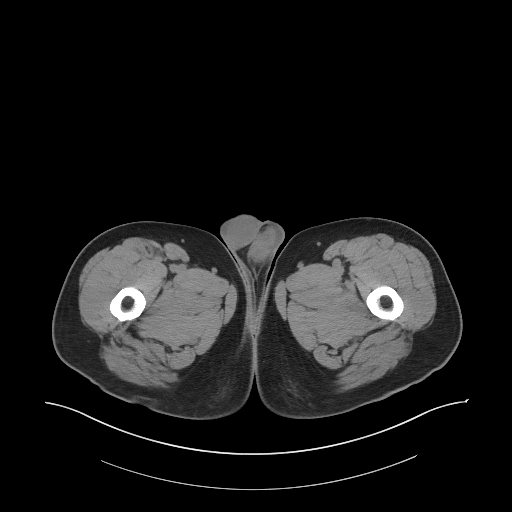
[im 9/111  bone]
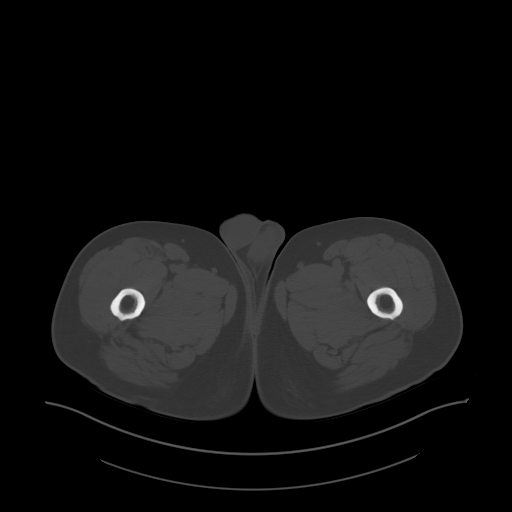
[im 18/111  soft-tissue]
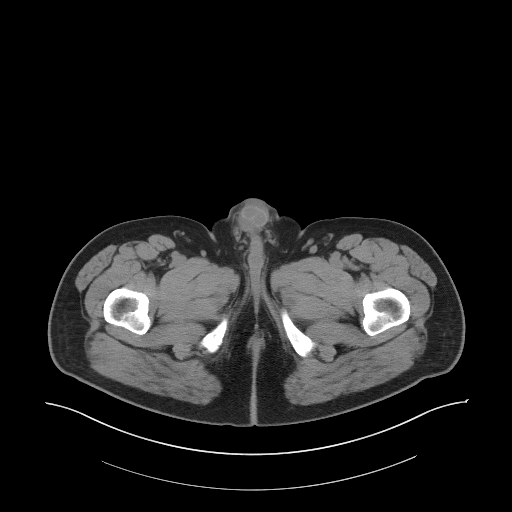
[im 27/111  soft-tissue]
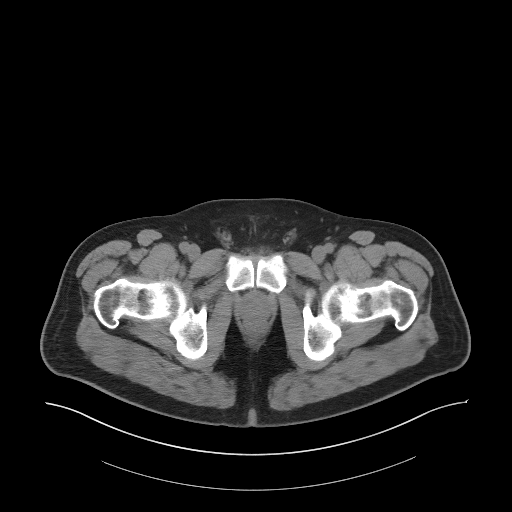
[im 36/111  soft-tissue]
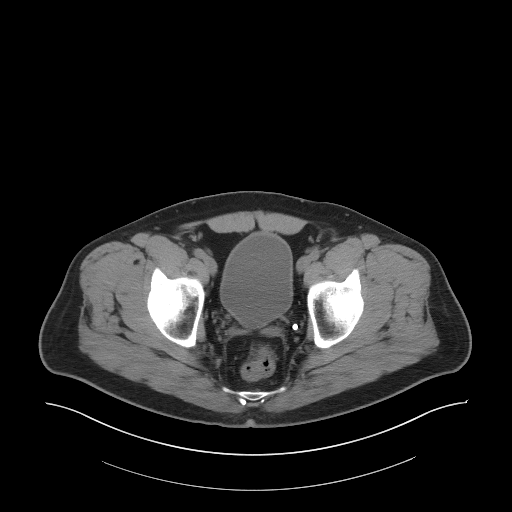
[im 45/111  soft-tissue]
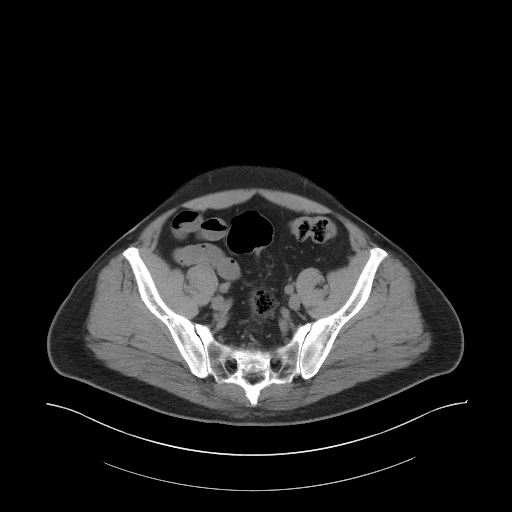
[im 53/111  soft-tissue]
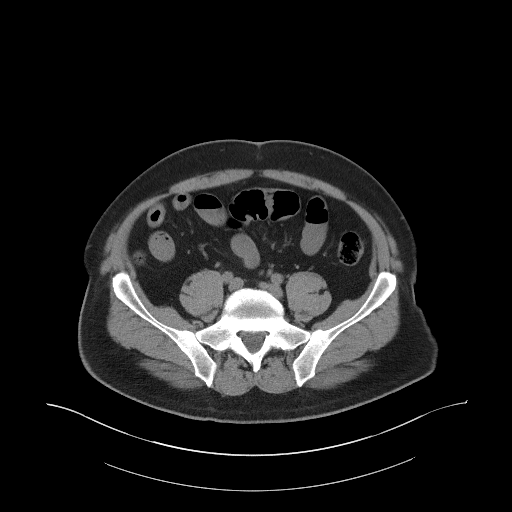
[im 62/111  soft-tissue]
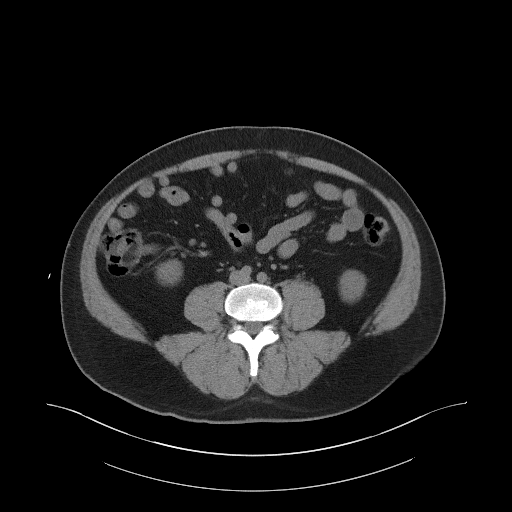
[im 71/111  soft-tissue]
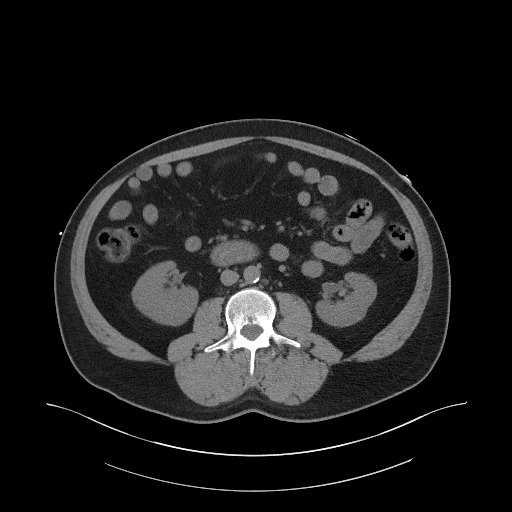
[im 80/111  soft-tissue]
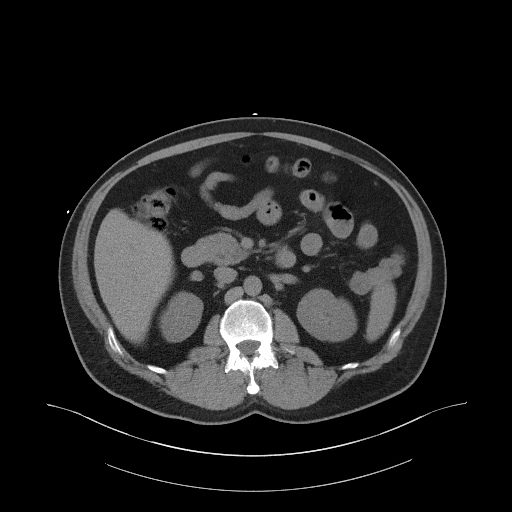
[im 80/111  bone]
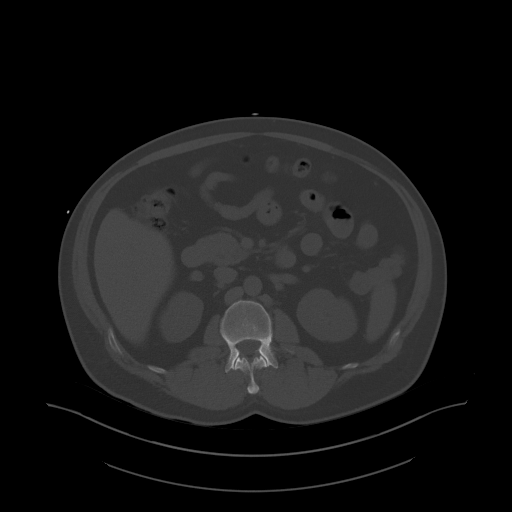
[im 89/111  soft-tissue]
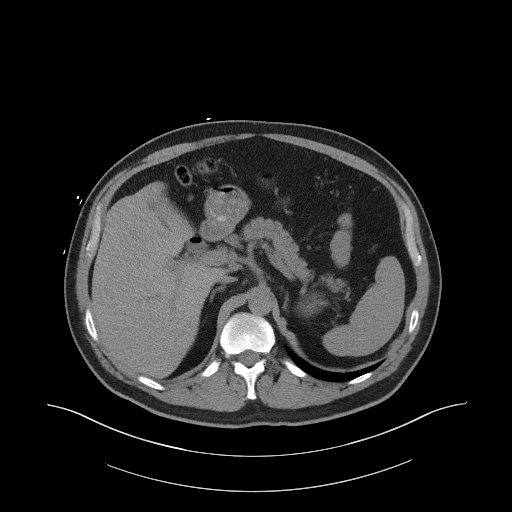
[im 97/111  soft-tissue]
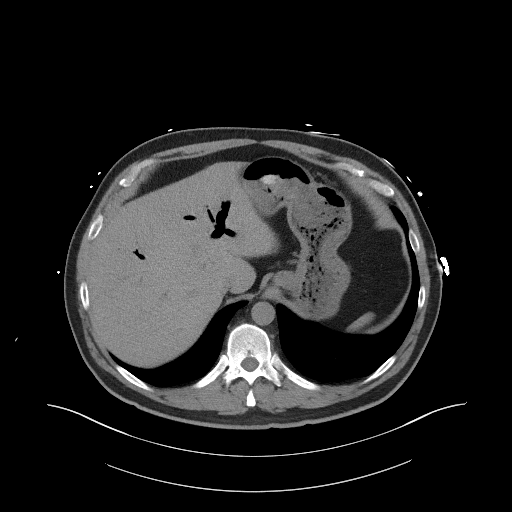
[im 106/111  soft-tissue]
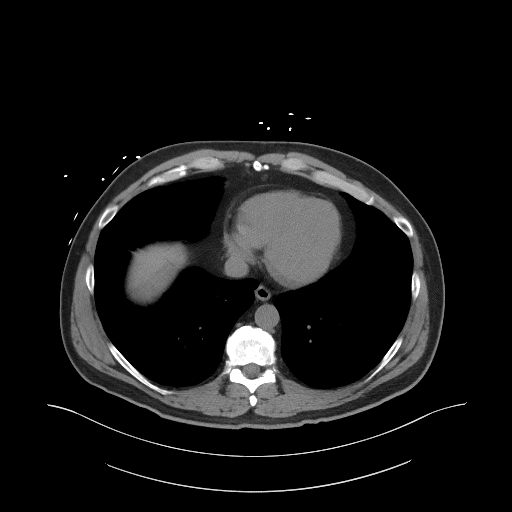

[Series 5: coronal st · coronal · 0.82mm/px · 3 of 102 slices shown]
[im 34/102  soft-tissue]
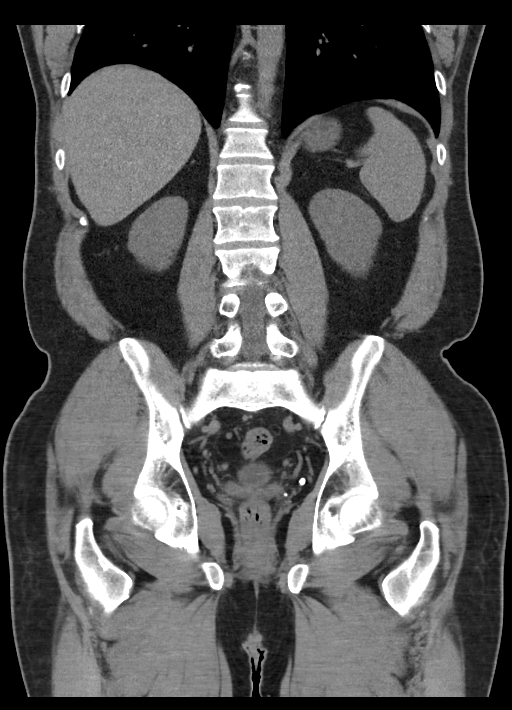
[im 45/102  soft-tissue]
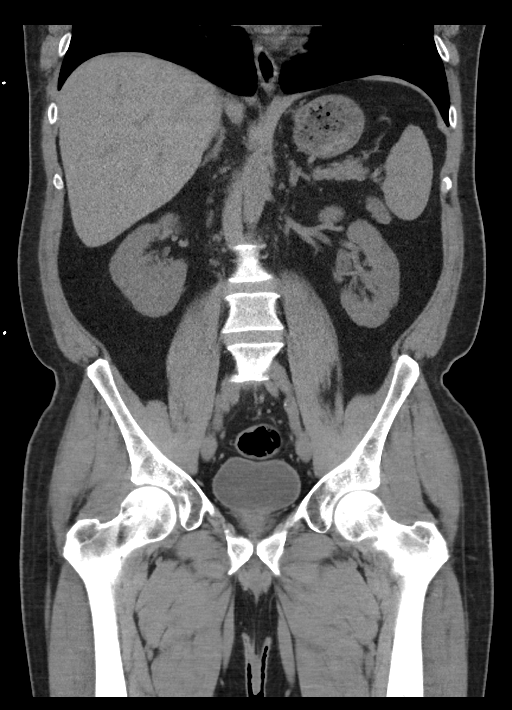
[im 57/102  soft-tissue]
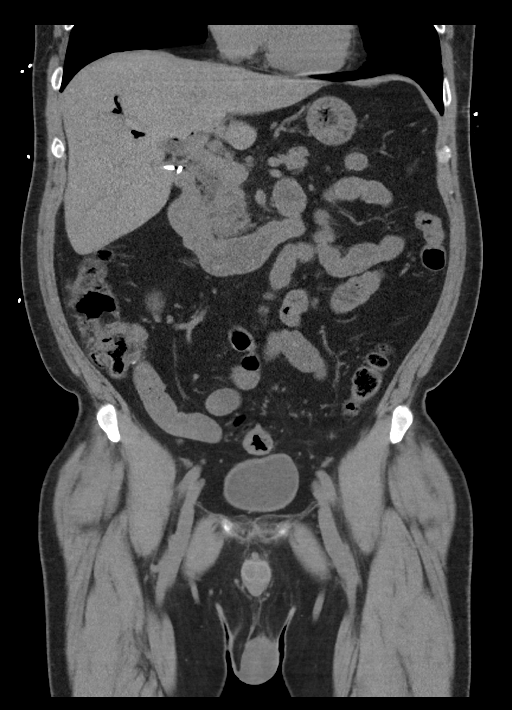

[15 of 46 positions shown; findings below may reference images not displayed]

FINDINGS: Lower chest: Unremarkable.

Hepatobiliary: No definite suspicious cystic or solid hepatic
lesions are confidently identified on today's noncontrast CT
examination. Status post cholecystectomy. Extensive pneumobilia,
similar to the prior study, presumably from prior sphincterotomy.

Pancreas: No definite pancreatic mass or peripancreatic fluid
collections or inflammatory changes are noted on today's noncontrast
CT examination.

Spleen: Unremarkable.

Adrenals/Urinary Tract: No calcifications are identified within the
collecting system of either kidney, along the course of either
ureter, or within the lumen of the urinary bladder. No
hydroureteronephrosis. Unenhanced appearance of the kidneys,
bilateral adrenal glands and urinary bladder is normal.

Stomach/Bowel: Unenhanced appearance of the stomach is normal. There
is no pathologic dilatation of small bowel or colon. Status post
appendectomy.

Vascular/Lymphatic: Aortic atherosclerosis. No lymphadenopathy noted
in the abdomen or pelvis.

Reproductive: Coarse calcifications in the prostate gland. Seminal
vesicles are unremarkable in appearance.

Other: No significant volume of ascites.  No pneumoperitoneum.

Musculoskeletal: There are no aggressive appearing lytic or blastic
lesions noted in the visualized portions of the skeleton.
IMPRESSION: 1. No acute findings are noted in the abdomen or pelvis to account
for the patient's symptoms.
2. Aortic atherosclerosis.
3. Additional incidental findings, similar to the prior study, as
above.

## 2023-01-08 LAB — LAB REPORT - SCANNED

## 2023-01-25 ENCOUNTER — Other Ambulatory Visit: Payer: Self-pay | Admitting: Family Medicine

## 2023-01-25 DIAGNOSIS — I10 Essential (primary) hypertension: Secondary | ICD-10-CM

## 2023-02-23 LAB — LAB REPORT - SCANNED: EGFR: 90

## 2023-03-08 ENCOUNTER — Telehealth: Payer: Self-pay

## 2023-03-08 NOTE — Transitions of Care (Post Inpatient/ED Visit) (Signed)
03/08/2023  Name: Saylor Banfill MRN: [id number] DOB: 08-07-1975  Today's TOC FU Call Status: Today's TOC FU Call Status:: Unsuccessful Call (1st Attempt) Unsuccessful Call (1st Attempt) Date: 03/08/23  Attempted to reach the patient regarding the most recent Inpatient/ED visit.  Follow Up Plan: Additional outreach attempts will be made to reach the patient to complete the Transitions of Care (Post Inpatient/ED visit) call.     Antionette Fairy, RN,BSN,CCM RN Care Manager Transitions of Care  [place]  Direct Phone: [phone] Toll Free: [phone] Fax: [phone]

## 2023-03-11 ENCOUNTER — Telehealth: Payer: Self-pay

## 2023-03-11 NOTE — Transitions of Care (Post Inpatient/ED Visit) (Signed)
03/11/2023  Name: [name] MRN: [id number] DOB: [birth year]  Today's TOC FU Call Status: Today's TOC FU Call Status:: Successful TOC FU Call Completed TOC FU Call Complete Date: 03/11/23 Patient's Name and Date of Birth confirmed.  Transition Care Management Follow-up Telephone Call Date of Discharge: 03/06/23 Discharge Facility: Other ([facility]) Name of Other ([facility]) [facility]charge Facility: [facility] Type of Discharge: Inpatient Admission Primary Inpatient Discharge Diagnosis:: 'acute pancreatitis without infection or necrosis" How have you been since you were released from the hospital?: Better (Pt voices he is doing better-still has some mild abd pain at times-but improved  & managed with pain regimen, Appetite fair-eating about 1-2x/day-normal for him, very mild nausea this AM-did not need to take med for it, no issues with elimination) Any questions or concerns?: No  Items Reviewed: Did you receive and understand the discharge instructions provided?: Yes Medications obtained,verified, and reconciled?: Yes (Medications Reviewed) Any new allergies since your discharge?: No Dietary orders reviewed?: Yes Type of Diet Ordered:: low salt/heart healthy as tol Do you have support at home?: Yes People in Home: spouse Name of Support/Comfort Primary Source: Marchelle Folks  Medications Reviewed Today: Medications Reviewed Today     Reviewed by Charlyn Minerva, RN (Registered Nurse) on 03/11/23 at 1509  Med List Status: <None>   Medication Order Taking? Sig Documenting Provider Last Dose Status Informant  amitriptyline (ELAVIL) 50 MG tablet [id number] Yes Take 50 mg by mouth at bedtime. Take 50 mg by mouth about two hours before bedtime [provider] Taking Active Self  amLODipine (NORVASC) 5 MG tablet [id number] Yes TAKE 1 TABLET (5 MG TOTAL) BY MOUTH DAILY. [place], Betty G, MD Taking Active   benazepril (LOTENSIN) 40 MG tablet [id number]  Yes TAKE 1 TABLET BY MOUTH EVERY DAY [place], Betty G, MD Taking Active   buprenorphine [facility]) 10 MCG/HR [place] [id number] Yes Place 1 patch onto the skin once a week. Place 1 patch on the skin every 7 days for 28 days. [provider] Taking Active Self  fluticasone (FLONASE) 50 MCG/ACT nasal spray [id number] Yes PLACE 1 SPRAY INTO BOTH NOSTRILS 2 (TWO) TIMES DAILY [place], Betty G, MD Taking Active   hydrOXYzine (ATARAX) 25 MG tablet [id number]  Take 1 tablet (25 mg total) by mouth 2 (two) times daily as needed for anxiety. [place], Betty G, MD  Active   KLOR-CON M20 20 MEQ tablet [id number] No TAKE 1 TABLET BY MOUTH EVERY DAY  Patient not taking: Reported on 03/11/2023   [place], Betty G, MD Not Taking Active            Med Note Modena Jansky Mar 11, 2023  3:01 PM) Med discontinued per hospital d/c instructions  lidocaine (LIDODERM) 5 % [id number] Yes Place 1 patch onto the skin daily. Remove & Discard patch within 12 hours or as directed by MD [provider] Taking Active Self  Melatonin 10 MG TABS [id number] Yes Take 2 tablets by mouth at bedtime as needed (for sleep). Take 20 mg by mouth nightly as needed for sleep. [provider] Taking Active Self  metoprolol succinate (TOPROL-XL) 100 MG 24 hr tablet [id number] Yes TAKE 1 TABLET BY MOUTH EVERY DAY WITH OR IMMEDIATELY FOLLOWING A MEAL [place], Betty G, MD Taking Active   mirtazapine (REMERON SOL-TAB) 15 MG disintegrating tablet [id number]  TAKE 0.5-1 TABLETS (7.5-15 MG TOTAL) BY MOUTH AT BEDTIME. [place], Betty G, MD  Active   naloxone [facility]) nasal spray 4 mg/0.1 mL  [id number] Yes Place 1 spray into the nose once. Use according to label directions only if opioid overdose is suspected. [provider]  Active Self  ondansetron (ZOFRAN) 4 MG tablet [id number]  Take 1 tablet (4 mg total) by mouth every 4 (four) hours as needed for nausea or vomiting. Sloan Leiter, DO  Active   ondansetron (ZOFRAN-ODT) 4 MG  disintegrating tablet [id number] Yes Take 1 tablet (4 mg total) by mouth every 8 (eight) hours as needed for nausea or vomiting. Schutt, Edsel Petrin, PA-C Taking Active   OxyCODONE HCl, Abuse Deter, (OXAYDO) 5 MG TABA [id number] Yes Take 1 tablet by mouth every 4 (four) hours as needed (pain). Oxycodone 5mg  q4hrs prn for 3 days [provider] Taking Active Self  Pancrelipase, Lip-Prot-Amyl, 24000-76000 units CPEP [id number] Yes Take 72,000 Units by mouth 3 (three) times daily. [provider] Taking Active Self  promethazine (PHENERGAN) 25 MG suppository [id number] No Place 1 suppository (25 mg total) rectally every 6 (six) hours as needed for nausea or vomiting.  Patient not taking: Reported on 03/11/2023   Levin Erp, MD Not Taking Active   promethazine (PHENERGAN) 25 MG tablet [id number] No Take 1 tablet (25 mg total) by mouth every 6 (six) hours as needed for nausea or vomiting.  Patient not taking: Reported on 03/11/2023   Molpus, John, MD Not Taking Active   rosuvastatin (CRESTOR) 10 MG tablet [id number] Yes Take 1 tablet (10 mg total) by mouth daily. [place], Betty G, MD Taking Active             Home Care and Equipment/Supplies: Were Home Health Services Ordered?: NA Any new equipment or medical supplies ordered?: NA  Functional Questionnaire: Do you need assistance with bathing/showering or dressing?: No Do you need assistance with meal preparation?: No Do you need assistance with eating?: No Do you have difficulty maintaining continence: No Do you need assistance with getting out of bed/getting out of a chair/moving?: No Do you have difficulty managing or taking your medications?: No  Follow up appointments reviewed: PCP Follow-up appointment confirmed?: No (pt declined f/u with PCP-states seeing specialists as advised,will call office if he changes his mind about making appt) MD Provider Line Number:[phone] Given: No Specialist Hospital Follow-up  appointment confirmed?: Yes Date of Specialist follow-up appointment?: 03/21/23 Follow-Up Specialty Provider:: H[facility] Mo mgmt MD, Dr. Carollee Herter MD-04/18/23 Do you need transportation to your follow-up appointment?: No (pt confirms he is able to get to appts) Do you understand care options if your condition(s) worsen?: Yes-patient verbalized understanding  SDOH Interventions Today    Flowsheet Row Most Recent Value  SDOH Interventions   Food Insecurity Interventions Intervention Not Indicated  Transportation Interventions Intervention Not Indicated      Interventions Today    Flowsheet Row Most Recent Value  Chronic Disease   Chronic disease during today's visit Hypertension (HTN), Other  [pancreatitis]  General Interventions   General Interventions Discussed/Reviewed General Interventions Discussed, Doctor Visits, Referral to Nurse  [pt declined ongoing RN TOC calls-states he is doign better and able to manage condition-was advised to call RN CM back if needed]  Doctor Visits Discussed/Reviewed Doctor Visits Discussed, PCP, Specialist  Durable Medical Equipment (DME) BP Cuff  [reviewed importance of monitoring BP in the home]  PCP/Specialist Visits Compliance with follow-up visit  Education Interventions   Education Provided Provided Education  Provided Verbal Education On Nutrition, Medication, When to see the doctor, Other  [pain mgmt, GI sx mgmt]  Nutrition Interventions   Nutrition  Discussed/Reviewed Nutrition Discussed, Fluid intake, Adding fruits and vegetables, Decreasing salt, Increasing proteins  Pharmacy Interventions   Pharmacy Dicussed/Reviewed Pharmacy Topics Discussed, Medications and their functions  Safety Interventions   Safety Discussed/Reviewed Safety Discussed      TOC Interventions Today    Flowsheet Row Most Recent Value  TOC Interventions   TOC Interventions Discussed/Reviewed TOC Interventions Discussed       Antionette Fairy,  RN,BSN,CCM RN Care Manager Transitions of Care  [place]Population [facility]  Direct Phone: [phone] Toll Free: [phone] Fax: [phone]

## 2023-03-11 NOTE — Transitions of Care (Post Inpatient/ED Visit) (Signed)
03/11/2023  Name: [name] MRN: [id number] DOB: 10-18-1975  Today's TOC FU Call Status: Today's TOC FU Call Status:: Unsuccessful Call (2nd Attempt) Unsuccessful Call (2nd Attempt) Date: 03/11/23  Attempted to reach the patient regarding the most recent Inpatient/ED visit.  Follow Up Plan: Additional outreach attempts will be made to reach the patient to complete the Transitions of Care (Post Inpatient/ED visit) call.     Antionette Fairy, RN,BSN,CCM RN Care Manager Transitions of Care  [place]  Direct Phone: [phone] Toll Free: [phone] Fax: [phone]

## 2023-03-14 ENCOUNTER — Other Ambulatory Visit: Payer: Self-pay | Admitting: Family Medicine

## 2023-04-03 LAB — LAB REPORT - SCANNED: EGFR: 90

## 2023-05-01 LAB — LAB REPORT - SCANNED: EGFR: 90

## 2023-05-03 ENCOUNTER — Other Ambulatory Visit: Payer: Self-pay | Admitting: Family Medicine

## 2023-05-03 DIAGNOSIS — I10 Essential (primary) hypertension: Secondary | ICD-10-CM

## 2023-06-25 LAB — LAB REPORT - SCANNED
Calcium: 10.1
EGFR: 0.9

## 2023-06-29 LAB — LAB REPORT - SCANNED

## 2023-07-07 ENCOUNTER — Other Ambulatory Visit: Payer: Self-pay | Admitting: Family Medicine

## 2023-07-08 LAB — LAB REPORT - SCANNED: EGFR: 90

## 2023-07-12 ENCOUNTER — Encounter: Payer: Self-pay | Admitting: Family Medicine

## 2023-07-12 LAB — LAB REPORT - SCANNED
Calcium: 9.5
EGFR: 90

## 2023-07-26 NOTE — Progress Notes (Signed)
 HPI: Mr. Zakary Kimura is a 48 y.o.male with a PMHx significant for HTN, atherosclerosis of aorta, kidney stones, HLD, chronic pain, and insomnia, among others, who is here today for his routine physical examination.  Last CPE: More than one year ago  Exercise: Patient states he is active at work and walks for 30 minutes to an hour every night.  Diet: He eats out frequently while working. He eats vegetables daily.  Sleep: 5-6 hours daily. He works night shifts.  Alcohol Use: rarely Smoking: He has decreased his smoking to about 4 cigarettes per day.  Vision: UTD on routine vision care. His next appointment is in 2 weeks.  Dental: He no longer needs a dentist because of his false teeth.   Immunization History  Administered Date(s) Administered   Influenza-Unspecified 05/01/2023   Moderna Sars-Covid-2 Vaccination 07/24/2019, 08/18/2019, 05/16/2020   Health Maintenance  Topic Date Due   Pneumococcal Vaccine 4-71 Years old (1 of 2 - PCV) Never done   DTaP/Tdap/Td (1 - Tdap) Never done   COVID-19 Vaccine (4 - 2024-25 season) 01/27/2023   Colonoscopy  12/30/2029   INFLUENZA VACCINE  Completed   Hepatitis C Screening  Completed   HIV Screening  Completed   HPV VACCINES  Aged Out   Chronic medical problems:   Chronic abdominal pain:  He sees gastroenterology every 3 months for a nerve block, which has helped.  Last colonoscopy was in 12/2022.   Hypertension:  Medications: Currently on metoprolol succinate 100 mg daily, amlodipine 5 mg daily, and benazepril 40 mg daily. Also taking potassium 20 mEq on some days.   BP readings at home: He checks his BP at home once in a while and says his readings are normal unless he is in pain. His diastolic readings range from 78-83.  Side effects: none Negative for unusual or severe headache, visual changes, exertional chest pain, dyspnea,  focal weakness, or edema.  Comprehensive Metabolic Panel Order: [id number] Component Ref Range &  Units 2 d ago  Sodium 136 - 145 mmol/L 138  Potassium 3.4 - 4.5 mmol/L 3.5  Chloride 98 - 107 mmol/L 104  CO2 21 - 31 mmol/L 28  Anion Gap 6 - 14 mmol/L 6  Glucose, Random 70 - 99 mg/dL 97  Blood Urea Nitrogen (BUN) 7 - 25 mg/dL 22  Creatinine 4.09 - 8.11 mg/dL 9.14  eGFR >78 GN/FAO/1.30Q6 86  Comment: GFR estimated by CKD-EPI equations(NKF 2021).  "Recommend confirmation of Cr-based eGFR by using Cys-based eGFR and other filtration markers (if applicable) in complex cases and clinical decision-making, as needed."  Albumin 3.5 - 5.7 g/dL 4.3  Total Protein 6.4 - 8.9 g/dL 6.9  Bilirubin, Total 0.3 - 1.0 mg/dL 0.4  Alkaline Phosphatase (ALP) 34 - 104 U/L 90  Aspartate Aminotransferase (AST) 13 - 39 U/L 16  Alanine Aminotransferase (ALT) 7 - 52 U/L 35  Calcium 8.6 - 10.3 mg/dL 9.3   Hyperlipidemia: Currently on rosuvastatin 10 mg daily.  Side effects from medication: none Lab Results  Component Value Date   CHOL 246 (H) 05/16/2021   HDL 47.20 05/16/2021   LDLCALC 113 (H) 06/03/2015   LDLDIRECT 170.0 05/16/2021   TRIG 273.0 (H) 05/16/2021   CHOLHDL 5 05/16/2021   Chronic pain:  Currently on amitriptyline 50 mg daily.  No longer taking buprenorphine (held to see if nerve block helps), hydroxyzine, or mirtazapine.  He follows with pain management every 6 months and his next appointment is in 08/2023.   No  new concerns or problems today.   Review of Systems  Constitutional:  Negative for activity change, appetite change and fever.  HENT:  Negative for nosebleeds, sore throat and trouble swallowing.   Eyes:  Negative for redness and visual disturbance.  Respiratory:  Negative for cough, shortness of breath and wheezing.   Cardiovascular:  Negative for chest pain, palpitations and leg swelling.  Gastrointestinal:  Positive for abdominal pain, nausea and vomiting (During acute episdoes of abdominal pain). Negative for blood in stool.  Endocrine: Negative for cold  intolerance, heat intolerance, polydipsia, polyphagia and polyuria.  Genitourinary:  Negative for decreased urine volume, dysuria, genital sores, hematuria and testicular pain.  Musculoskeletal:  Negative for gait problem and myalgias.  Skin:  Negative for color change and rash.  Neurological:  Negative for syncope, weakness and headaches.  Hematological:  Negative for adenopathy. Does not bruise/bleed easily.  Psychiatric/Behavioral:  Negative for confusion and hallucinations.    Current Outpatient Medications on File Prior to Visit  Medication Sig Dispense Refill   amitriptyline (ELAVIL) 50 MG tablet Take 50 mg by mouth at bedtime. Take 50 mg by mouth about two hours before bedtime     buprenorphine (BUTRANS) 10 MCG/HR PTWK Place 1 patch onto the skin once a week. Place 1 patch on the skin every 7 days for 28 days.     fluticasone (FLONASE) 50 MCG/ACT nasal spray PLACE 1 SPRAY INTO BOTH NOSTRILS 2 (TWO) TIMES DAILY 48 mL 1   KLOR-CON M20 20 MEQ tablet TAKE 1 TABLET BY MOUTH EVERY DAY 90 tablet 1   lidocaine (LIDODERM) 5 % Place 1 patch onto the skin daily. Remove & Discard patch within 12 hours or as directed by MD     Melatonin 10 MG TABS Take 2 tablets by mouth at bedtime as needed (for sleep). Take 20 mg by mouth nightly as needed for sleep.     metoprolol succinate (TOPROL-XL) 100 MG 24 hr tablet TAKE 1 TABLET BY MOUTH EVERY DAY WITH OR IMMEDIATELY FOLLOWING A MEAL 90 tablet 1   naloxone (NARCAN) nasal spray 4 mg/0.1 mL Place 1 spray into the nose once. Use according to label directions only if opioid overdose is suspected.     ondansetron (ZOFRAN) 4 MG tablet Take 1 tablet (4 mg total) by mouth every 4 (four) hours as needed for nausea or vomiting. 5 tablet 0   ondansetron (ZOFRAN-ODT) 4 MG disintegrating tablet Take 1 tablet (4 mg total) by mouth every 8 (eight) hours as needed for nausea or vomiting. 20 tablet 0   OxyCODONE HCl, Abuse Deter, (OXAYDO) 5 MG TABA Take 1 tablet by mouth  every 4 (four) hours as needed (pain). Oxycodone 5mg  q4hrs prn for 3 days     Pancrelipase, Lip-Prot-Amyl, 24000-76000 units CPEP Take 72,000 Units by mouth 3 (three) times daily.     promethazine (PHENERGAN) 25 MG suppository Place 1 suppository (25 mg total) rectally every 6 (six) hours as needed for nausea or vomiting. 12 each 0   promethazine (PHENERGAN) 25 MG tablet Take 1 tablet (25 mg total) by mouth every 6 (six) hours as needed for nausea or vomiting. 20 tablet 0   rosuvastatin (CRESTOR) 10 MG tablet Take 1 tablet (10 mg total) by mouth daily. Due for physical/follow up 30 tablet 0   No current facility-administered medications on file prior to visit.   Past Medical History:  Diagnosis Date   Chronic abdominal pain    Diverticulitis    Drug-seeking behavior  Hypertension    Kidney stones    Pancreatitis, chronic (HCC) 05/28/2005   Past Surgical History:  Procedure Laterality Date   APPENDECTOMY     CHOLECYSTECTOMY     ERCP N/A 12/30/2019   Procedure: ENDOSCOPIC RETROGRADE CHOLANGIOPANCREATOGRAPHY (ERCP);  Surgeon: Rachael Fee, MD;  Location: [facility];  Service: Endoscopy;  Laterality: N/A;   ERCP N/A 03/04/2021   Procedure: ENDOSCOPIC RETROGRADE CHOLANGIOPANCREATOGRAPHY (ERCP);  Surgeon: Jeani Hawking, MD;  Location: [facility];  Service: Endoscopy;  Laterality: N/A;   ERCP N/A 12/13/2021   Procedure: ENDOSCOPIC RETROGRADE CHOLANGIOPANCREATOGRAPHY (ERCP);  Surgeon: Meryl Dare, MD;  Location: [facility];  Service: Gastroenterology;  Laterality: N/A;   ERCP W/ METAL STENT PLACEMENT     KIDNEY STONE SURGERY     REMOVAL OF STONES  12/30/2019   Procedure: REMOVAL OF STONES;  Surgeon: Rachael Fee, MD;  Location: [facility];  Service: Endoscopy;;   REMOVAL OF STONES  03/04/2021   Procedure: REMOVAL OF STONES;  Surgeon: Jeani Hawking, MD;  Location: [facility];  Service: Endoscopy;;   REMOVAL OF STONES  12/13/2021   Procedure: REMOVAL OF STONES;  Surgeon:  Meryl Dare, MD;  Location: [facility];  Service: Gastroenterology;;   Dennison Mascot  03/04/2021   Procedure: Dennison Mascot;  Surgeon: Jeani Hawking, MD;  Location: [facility];  Service: Endoscopy;;   Allergies  Allergen Reactions   Cortisone Other (See Comments)    drops potassium level "bottoms out" potassium level drops potassium level Bottoms out potassium  "bottoms out" potassium level Other reaction(s): Other (See Comments) Bottoms out potassium   Egg-Derived Products Hives and Other (See Comments)    Rash  Rash   Ketorolac Hives and Other (See Comments)    Hives, slightly labored breathing  Hives, slightly labored breathing   Haldol [Haloperidol Lactate] Other (See Comments)    "jittery"    Hydrocortisone Hives    Also drops potassium level   Iodinated Contrast Media Nausea And Vomiting and Other (See Comments)    Hives Hives Hives Hives   Ketorolac Tromethamine Hives   Reglan [Metoclopramide]     Face "draws" and gets figgety   Compazine [Prochlorperazine] Other (See Comments)    Dystonic reaction   Droperidol Other (See Comments)    Makes face draw up   Ketamine     Patient reports " I am allergic to it."    Family History  Problem Relation Age of Onset   Breast cancer Mother    Early death Mother    Hypertension Mother    Heart failure Father    Hypertension Father    Heart disease Father    Hyperlipidemia Father    Pancreatic cancer Paternal Grandmother        possibly   Pancreatic cancer Paternal Aunt    Colon cancer Neg Hx     Social History   Socioeconomic History   Marital status: Married    Spouse name: Not on file   Number of children: 1   Years of education: Not on file   Highest education level: Some college, no degree  Occupational History   Not on file  Tobacco Use   Smoking status: Every Day    Current packs/day: 0.50    Types: Cigarettes   Smokeless tobacco: Never  Vaping Use   Vaping status: Never Used   Substance and Sexual Activity   Alcohol use: Not Currently   Drug use: No   Sexual activity: Not on file  Other Topics Concern  Not on file  Social History Narrative   Not on file   Social Drivers of Health   Financial Resource Strain: Low Risk  (04/03/2021)   Overall Financial Resource Strain (CARDIA)    Difficulty of Paying Living Expenses: Not hard at all  Food Insecurity: Low Risk  (05/09/2023)   Received from [facility]   Hunger Vital Sign    Worried About Running Out of Food in the Last Year: Never true    Ran Out of Food in the Last Year: Never true  Transportation Needs: No Transportation Needs (05/09/2023)   Received from [facility]    In the past 12 months, has lack of reliable transportation kept you from medical appointments, meetings, work or from getting things needed for daily living? : No  Physical Activity: Insufficiently Active (04/03/2021)   Exercise Vital Sign    Days of Exercise per Week: 1 day    Minutes of Exercise per Session: 20 min  Stress: No Stress Concern Present (04/03/2021)   [employer] of Occupational Health - Occupational Stress Questionnaire    Feeling of Stress : Only a little  Social Connections: Socially Integrated (04/03/2021)   Social Connection and Isolation Panel [NHANES]    Frequency of Communication with Friends and Family: Three times a week    Frequency of Social Gatherings with Friends and Family: Once a week    Attends Religious Services: More than 4 times per year    Active Member of Clubs or Organizations: Yes    Attends Banker Meetings: More than 4 times per year    Marital Status: Married   Vitals:   07/29/23 0738  BP: 126/80  Pulse: 94  Resp: 12  SpO2: 96%   Body mass index is 28.53 kg/m.  Wt Readings from Last 3 Encounters:  07/29/23 216 lb 4 oz (98.1 kg)  06/26/22 213 lb 2 oz (96.7 kg)  06/20/22 215 lb (97.5 kg)   Physical Exam Vitals and nursing note reviewed.   Constitutional:      General: He is not in acute distress.    Appearance: He is well-developed.  HENT:     Head: Normocephalic and atraumatic.     Right Ear: Tympanic membrane, ear canal and external ear normal.     Left Ear: Tympanic membrane, ear canal and external ear normal.     Mouth/Throat:     Mouth: Mucous membranes are moist.     Dentition: Has dentures.     Pharynx: Oropharynx is clear. Uvula midline.  Eyes:     Extraocular Movements: Extraocular movements intact.     Conjunctiva/sclera: Conjunctivae normal.     Pupils: Pupils are equal, round, and reactive to light.  Neck:     Thyroid: No thyroid mass.  Cardiovascular:     Rate and Rhythm: Normal rate and regular rhythm.     Pulses:          Dorsalis pedis pulses are 2+ on the right side and 2+ on the left side.     Heart sounds: No murmur heard. Pulmonary:     Effort: Pulmonary effort is normal. No respiratory distress.     Breath sounds: Normal breath sounds.  Abdominal:     Palpations: Abdomen is soft. There is no hepatomegaly or mass.     Tenderness: There is no abdominal tenderness.  Genitourinary:    Comments: No concerns. Musculoskeletal:        General: No tenderness.  Cervical back: Normal range of motion.     Right lower leg: No edema.     Left lower leg: No edema.     Comments: No major deformities appreciated and no signs of synovitis.  Lymphadenopathy:     Cervical: No cervical adenopathy.     Upper Body:     Right upper body: No supraclavicular adenopathy.     Left upper body: No supraclavicular adenopathy.  Skin:    General: Skin is warm.     Findings: No erythema.  Neurological:     General: No focal deficit present.     Mental Status: He is alert and oriented to person, place, and time.     Cranial Nerves: No cranial nerve deficit.     Sensory: No sensory deficit.     Motor: No weakness.     Gait: Gait normal.     Deep Tendon Reflexes:     Reflex Scores:      Bicep reflexes are 2+  on the right side and 2+ on the left side.      Patellar reflexes are 2+ on the right side and 2+ on the left side. Psychiatric:        Mood and Affect: Mood and affect normal.   ASSESSMENT AND PLAN:  Mr. Gartrell was seen today for his routine general medical examination.   Orders Placed This Encounter  Procedures   Lipid panel   Lab Results  Component Value Date   CHOL 158 07/29/2023   HDL 57.40 07/29/2023   LDLCALC 72 07/29/2023   LDLDIRECT 170.0 05/16/2021   TRIG 141.0 07/29/2023   CHOLHDL 3 07/29/2023   Routine general medical examination at a health care facility Assessment & Plan: We discussed the importance of regular physical activity and healthy diet for prevention of chronic illness and/or complications. Preventive guidelines reviewed. Vaccination: Reported as up-to-date, Tdap given about 5 years ago. Next CPE in a year.   Hypertension, essential, benign Assessment & Plan: In general BP has been adequately controlled. Continue metoprolol succinate 100 mg daily, amlodipine 5 mg daily, benazepril 40 mg daily. He has seen other providers throughout the year and checking BP at home, so annual follow-ups are appropriate, before if needed. Continue low-salt diet. Having eye exam in 2 weeks.  Orders: -     amLODIPine Besylate; Take 1 tablet (5 mg total) by mouth daily.  Dispense: 90 tablet; Refill: 3 -     Benazepril HCl; Take 1 tablet (40 mg total) by mouth daily.  Dispense: 90 tablet; Refill: 3  Hyperlipidemia, mixed Assessment & Plan: Continue rosuvastatin 10 mg daily. Low-fat diet was recommended. Further recommendation will be given according to lipid panel result.  Orders: -     Lipid panel; Future  Hypokalemia Assessment & Plan: Problem seems to be caused by emesis during episodes of acute abdominal pain, which presents with nausea and vomiting. Currently he is taking K-Lor 20 mK as needed. Last potassium on 07/27/2023 was 3.5.   Chronic biliary  pancreatitis [facility]) Assessment & Plan: Following with gastroenterologist every 3 to 4 months.   Return in 1 year (on 07/28/2024) for CPE, chronic problems, Labs.  I, Rolla Etienne Wierda, acting as a scribe for Marshun Duva [place], MD., have documented all relevant documentation on the behalf of Salman Wellen [place], MD, as directed by  Jamarques Pinedo [place], MD while in the presence of Francisca Langenderfer [place], MD.   I, Deejay Koppelman [place], MD, have reviewed all documentation for this visit. The documentation on 07/29/23  for the exam, diagnosis, procedures, and orders are all accurate and complete.  Masashi Snowdon G. [place], MD  [facility]. [place] office.

## 2023-07-29 ENCOUNTER — Encounter: Payer: Self-pay | Admitting: Family Medicine

## 2023-07-29 ENCOUNTER — Ambulatory Visit (INDEPENDENT_AMBULATORY_CARE_PROVIDER_SITE_OTHER): Payer: No Typology Code available for payment source | Admitting: Family Medicine

## 2023-07-29 VITALS — BP 126/80 | HR 94 | Resp 12 | Ht 73.0 in | Wt 216.2 lb

## 2023-07-29 DIAGNOSIS — E876 Hypokalemia: Secondary | ICD-10-CM | POA: Diagnosis not present

## 2023-07-29 DIAGNOSIS — K861 Other chronic pancreatitis: Secondary | ICD-10-CM

## 2023-07-29 DIAGNOSIS — Z0000 Encounter for general adult medical examination without abnormal findings: Secondary | ICD-10-CM | POA: Diagnosis not present

## 2023-07-29 DIAGNOSIS — I10 Essential (primary) hypertension: Secondary | ICD-10-CM | POA: Diagnosis not present

## 2023-07-29 DIAGNOSIS — E782 Mixed hyperlipidemia: Secondary | ICD-10-CM | POA: Diagnosis not present

## 2023-07-29 LAB — LIPID PANEL
Cholesterol: 158 mg/dL (ref 0–200)
HDL: 57.4 mg/dL (ref >39.00)
LDL Cholesterol: 72 mg/dL (ref 0–99)
NonHDL: 100.1
Total CHOL/HDL Ratio: 3
Triglycerides: 141 mg/dL (ref 0.0–149.0)
VLDL: 28.2 mg/dL (ref 0.0–40.0)

## 2023-07-29 MED ORDER — ROSUVASTATIN CALCIUM 10 MG PO TABS: 10.0000 mg | ORAL_TABLET | Freq: Every day | ORAL | 3 refills | Status: AC

## 2023-07-29 MED ORDER — BENAZEPRIL HCL 40 MG PO TABS: 40.0000 mg | ORAL_TABLET | Freq: Every day | ORAL | 3 refills | Status: DC

## 2023-07-29 MED ORDER — AMLODIPINE BESYLATE 5 MG PO TABS: 5.0000 mg | ORAL_TABLET | Freq: Every day | ORAL | 3 refills | Status: DC

## 2023-07-29 NOTE — Assessment & Plan Note (Signed)
 In general BP has been adequately controlled. Continue metoprolol succinate 100 mg daily, amlodipine 5 mg daily, benazepril 40 mg daily. He has seen other providers throughout the year and checking BP at home, so annual follow-ups are appropriate, before if needed. Continue low-salt diet. Having eye exam in 2 weeks.

## 2023-07-29 NOTE — Patient Instructions (Addendum)
 A few things to remember from today's visit:  Routine general medical examination at a health care facility  Screening for endocrine, metabolic and immunity disorder  Hypertension, essential, benign  Hyperlipidemia, mixed - Plan: Lipid panel  No changes today. Monitor blood pressure regularly. Continue potassium as needed. Because you see other providers regularly I can see you annually unless your blood pressure starts going up or you have other problems.  If you need refills for medications you take chronically, please call your pharmacy. Do not use My Chart to request refills or for acute issues that need immediate attention. If you send a my chart message, it may take a few days to be addressed, specially if I am not in the office.  Please be sure medication list is accurate. If a new problem present, please set up appointment sooner than planned today.  Health Maintenance, Male Adopting a healthy lifestyle and getting preventive care are important in promoting health and wellness. Ask your health care provider about: The right schedule for you to have regular tests and exams. Things you can do on your own to prevent diseases and keep yourself healthy. What should I know about diet, weight, and exercise? Eat a healthy diet  Eat a diet that includes plenty of vegetables, fruits, low-fat dairy products, and lean protein. Do not eat a lot of foods that are high in solid fats, added sugars, or sodium. Maintain a healthy weight Body mass index (BMI) is a measurement that can be used to identify possible weight problems. It estimates body fat based on height and weight. Your health care provider can help determine your BMI and help you achieve or maintain a healthy weight. Get regular exercise Get regular exercise. This is one of the most important things you can do for your health. Most adults should: Exercise for at least 150 minutes each week. The exercise should increase your  heart rate and make you sweat (moderate-intensity exercise). Do strengthening exercises at least twice a week. This is in addition to the moderate-intensity exercise. Spend less time sitting. Even light physical activity can be beneficial. Watch cholesterol and blood lipids Have your blood tested for lipids and cholesterol at 46 years of age, then have this test every 5 years. You may need to have your cholesterol levels checked more often if: Your lipid or cholesterol levels are high. You are older than 46 years of age. You are at high risk for heart disease. What should I know about cancer screening? Many types of cancers can be detected early and may often be prevented. Depending on your health history and family history, you may need to have cancer screening at various ages. This may include screening for: Colorectal cancer. Prostate cancer. Skin cancer. Lung cancer. What should I know about heart disease, diabetes, and high blood pressure? Blood pressure and heart disease High blood pressure causes heart disease and increases the risk of stroke. This is more likely to develop in people who have high blood pressure readings or are overweight. Talk with your health care provider about your target blood pressure readings. Have your blood pressure checked: Every 3-5 years if you are 33-43 years of age. Every year if you are 30 years old or older. If you are between the ages of 31 and 73 and are a current or former smoker, ask your health care provider if you should have a one-time screening for abdominal aortic aneurysm (AAA). Diabetes Have regular diabetes screenings. This checks your fasting blood  sugar level. Have the screening done: Once every three years after age 61 if you are at a normal weight and have a low risk for diabetes. More often and at a younger age if you are overweight or have a high risk for diabetes. What should I know about preventing infection? Hepatitis B If you  have a higher risk for hepatitis B, you should be screened for this virus. Talk with your health care provider to find out if you are at risk for hepatitis B infection. Hepatitis C Blood testing is recommended for: Everyone born from [birth year] through 1963. Anyone with known risk factors for hepatitis C. Sexually transmitted infections (STIs) You should be screened each year for STIs, including gonorrhea and chlamydia, if: You are sexually active and are younger than 46 years of age. You are older than 46 years of age and your health care provider tells you that you are at risk for this type of infection. Your sexual activity has changed since you were last screened, and you are at increased risk for chlamydia or gonorrhea. Ask your health care provider if you are at risk. Ask your health care provider about whether you are at high risk for HIV. Your health care provider may recommend a prescription medicine to help prevent HIV infection. If you choose to take medicine to prevent HIV, you should first get tested for HIV. You should then be tested every 3 months for as long as you are taking the medicine. Follow these instructions at home: Alcohol use Do not drink alcohol if your health care provider tells you not to drink. If you drink alcohol: Limit how much you have to 0-2 drinks a day. Know how much alcohol is in your drink. In the [place] one drink equals one 12 oz bottle of beer (355 mL), one 5 oz glass of wine (148 mL), or one 1 oz glass of hard liquor (44 mL). Lifestyle Do not use any products that contain nicotine or tobacco. These products include cigarettes, chewing tobacco, and vaping devices, such as e-cigarettes. If you need help quitting, ask your health care provider. Do not use street drugs. Do not share needles. Ask your health care provider for help if you need support or information about quitting drugs. General instructions Schedule regular health, dental, and eye exams. Stay  current with your vaccines. Tell your health care provider if: You often feel depressed. You have ever been abused or do not feel safe at home. Summary Adopting a healthy lifestyle and getting preventive care are important in promoting health and wellness. Follow your health care provider's instructions about healthy diet, exercising, and getting tested or screened for diseases. Follow your health care provider's instructions on monitoring your cholesterol and blood pressure. This information is not intended to replace advice given to you by your health care provider. Make sure you discuss any questions you have with your health care provider. Document Revised: 10/03/2020 Document Reviewed: 10/03/2020 Elsevier Patient Education  2024 [employer].

## 2023-07-29 NOTE — Assessment & Plan Note (Signed)
 Following with gastroenterologist every 3 to 4 months.

## 2023-07-29 NOTE — Assessment & Plan Note (Signed)
 Problem seems to be caused by emesis during episodes of acute abdominal pain, which presents with nausea and vomiting. Currently he is taking K-Lor 20 mK as needed. Last potassium on 07/27/2023 was 3.5.

## 2023-07-29 NOTE — Assessment & Plan Note (Signed)
 Continue rosuvastatin 10 mg daily. Low-fat diet was recommended. Further recommendation will be given according to lipid panel result.

## 2023-07-29 NOTE — Assessment & Plan Note (Signed)
 We discussed the importance of regular physical activity and healthy diet for prevention of chronic illness and/or complications. Preventive guidelines reviewed. Vaccination: Reported as up-to-date, Tdap given about 5 years ago. Next CPE in a year.

## 2023-07-30 ENCOUNTER — Encounter: Payer: Self-pay | Admitting: Family Medicine

## 2023-07-30 DIAGNOSIS — Z0279 Encounter for issue of other medical certificate: Secondary | ICD-10-CM

## 2023-08-06 LAB — LAB REPORT - SCANNED

## 2023-09-02 LAB — LAB REPORT - SCANNED

## 2023-09-22 LAB — LAB REPORT - SCANNED

## 2023-10-07 LAB — LAB REPORT - SCANNED: EGFR (Non-African Amer.): >90

## 2023-11-11 LAB — LAB REPORT - SCANNED

## 2024-01-07 LAB — LAB REPORT - SCANNED

## 2024-01-08 ENCOUNTER — Telehealth: Payer: Self-pay

## 2024-01-08 NOTE — Transitions of Care (Post Inpatient/ED Visit) (Signed)
   01/08/2024  Name: [name] MRN: [id number] DOB: 1976/03/29  Today's TOC FU Call Status: Today's TOC FU Call Status:: Unsuccessful Call (1st Attempt) Unsuccessful Call (1st Attempt) Date: 01/08/24  Attempted to reach the patient regarding the most recent Inpatient/ED visit.  Follow Up Plan: Additional outreach attempts will be made to reach the patient to complete the Transitions of Care (Post Inpatient/ED visit) call.   Signature [employer]

## 2024-01-11 LAB — LAB REPORT - SCANNED

## 2024-01-13 LAB — LAB REPORT - SCANNED: EGFR: 87

## 2024-01-17 ENCOUNTER — Telehealth: Payer: Self-pay

## 2024-01-17 NOTE — Transitions of Care (Post Inpatient/ED Visit) (Signed)
   01/17/2024  Name: [name] MRN: [id number] DOB: 01-05-1976  Today's TOC FU Call Status: Today's TOC FU Call Status:: Unsuccessful Call (1st Attempt) Unsuccessful Call (1st Attempt) Date: 01/17/24  Attempted to reach the patient regarding the most recent Inpatient/ED visit.  Follow Up Plan: Additional outreach attempts will be made to reach the patient to complete the Transitions of Care (Post Inpatient/ED visit) call.   Signature :Joren Rehm, CMA

## 2024-01-28 ENCOUNTER — Telehealth: Payer: Self-pay

## 2024-01-28 NOTE — Transitions of Care (Post Inpatient/ED Visit) (Signed)
   01/28/2024  Name: [name] MRN: [id number] DOB: 05-31-1975  Today's TOC FU Call Status: Today's TOC FU Call Status:: Unsuccessful Call (2nd Attempt) Unsuccessful Call (2nd Attempt) Date: 01/28/24  Attempted to reach the patient regarding the most recent Inpatient/ED visit.  Follow Up Plan: Additional outreach attempts will be made to reach the patient to complete the Transitions of Care (Post Inpatient/ED visit) call.   Signature [employer]

## 2024-01-30 ENCOUNTER — Telehealth: Payer: Self-pay | Admitting: *Deleted

## 2024-01-30 NOTE — Transitions of Care (Post Inpatient/ED Visit) (Signed)
   01/30/2024  Name: [name] MRN: [id number] DOB: 30-May-1975  Today's TOC FU Call Status:    Attempted to reach the patient regarding the most recent Inpatient/ED visit.  Follow Up Plan: No further outreach attempts will be made at this time. We have been unable to contact the patient.  Signature : Vernell English, CMA

## 2024-03-15 ENCOUNTER — Other Ambulatory Visit: Payer: Self-pay | Admitting: Family Medicine

## 2024-03-15 DIAGNOSIS — I10 Essential (primary) hypertension: Secondary | ICD-10-CM

## 2024-03-31 LAB — LAB REPORT - SCANNED

## 2024-04-06 LAB — LAB REPORT - SCANNED

## 2024-04-29 LAB — LAB REPORT - SCANNED: EGFR: 90

## 2024-05-02 LAB — LAB REPORT - SCANNED

## 2024-05-11 LAB — LAB REPORT - SCANNED

## 2024-05-15 LAB — LAB REPORT - SCANNED

## 2024-05-18 LAB — LAB REPORT - SCANNED

## 2024-05-26 LAB — LAB REPORT - SCANNED

## 2024-05-29 LAB — LAB REPORT - SCANNED

## 2024-06-02 LAB — LAB REPORT - SCANNED

## 2024-06-04 LAB — LAB REPORT - SCANNED

## 2024-06-05 NOTE — ED Triage Notes (Signed)
 Patient with hx of pancreatitis has returned after being treated at [facility] last night for abd pain.  Reports worsening pain since discharge.  No medications prior to arrival.

## 2024-06-09 ENCOUNTER — Encounter: Payer: Self-pay | Admitting: Family Medicine

## 2024-06-09 ENCOUNTER — Ambulatory Visit: Admitting: Family Medicine

## 2024-06-09 VITALS — BP 138/88 | HR 71 | Temp 98.0°F | Resp 16 | Ht 73.0 in | Wt 210.6 lb

## 2024-06-09 DIAGNOSIS — J309 Allergic rhinitis, unspecified: Secondary | ICD-10-CM | POA: Diagnosis not present

## 2024-06-09 DIAGNOSIS — R109 Unspecified abdominal pain: Secondary | ICD-10-CM

## 2024-06-09 DIAGNOSIS — G8929 Other chronic pain: Secondary | ICD-10-CM

## 2024-06-09 DIAGNOSIS — E876 Hypokalemia: Secondary | ICD-10-CM

## 2024-06-09 DIAGNOSIS — I10 Essential (primary) hypertension: Secondary | ICD-10-CM

## 2024-06-09 LAB — MAGNESIUM: Magnesium: 2.1 mg/dL (ref 1.5–2.5)

## 2024-06-09 LAB — POTASSIUM: Potassium: 3.1 meq/L — ABNORMAL LOW (ref 3.5–5.1)

## 2024-06-09 MED ORDER — METOPROLOL SUCCINATE ER 100 MG PO TB24: 100.0000 mg | ORAL_TABLET | Freq: Every day | ORAL | 3 refills | Status: AC

## 2024-06-09 MED ORDER — FLUTICASONE PROPIONATE 50 MCG/ACT NA SUSP: 1.0000 | Freq: Two times a day (BID) | NASAL | 1 refills | Status: AC

## 2024-06-09 MED ORDER — AMLODIPINE BESY-BENAZEPRIL HCL 5-40 MG PO CAPS: 1.0000 | ORAL_CAPSULE | Freq: Every day | ORAL | 2 refills | Status: AC

## 2024-06-09 NOTE — Assessment & Plan Note (Signed)
 BP mildly elevated today, reporting lower BPs at home when he is not in pain. Amlodipine -benazepril , changed to combination tablet, 5-40 mg daily and continue metoprolol  succinate 100 mg daily. Continue low-salt diet. Continue monitoring BP at home.

## 2024-06-09 NOTE — Progress Notes (Unsigned)
 "  Chief Complaint  Patient presents with   Hospitalization Follow-up    Flare up of pancreatitis     Discussed the use of AI scribe software for clinical note transcription with the patient, who gave verbal consent to proceed. History of Present Illness [name] is a 49 year old malewith a PMHx significant for HTN, atherosclerosis of aorta, kidney stones, HLD, chronic pain, and insomnia , who is here today to follow on recent ED visits. He was last seen on 07/29/23. Evaluated in the ED for abdominal pain on 1/2,1/3,1/6, 1/8,1/9, and 1/10.SABRA  He has been experiencing chronic abdominal pain, nausea, and vomiting, which have led to multiple ED visits. These symptoms have been intermittent for a long time, and he sees a gastroenterologist approximately every three months for this problems.  He was previously under pain management and was prescribed the Butrans patch (buprenorphine), which was ineffective. He stopped attending pain management sessions about a month ago due to concerns about side effects of other proposed treatments. ***   He continues to smoke, though he has reduced his intake to under half a pack per day. He is mindful not to smoke around his father-in-law, who has started smoking daily.  Hypertension: Currently he is on metoprolol  succinate 100 mg daily, amlodipine  5 mg, and benazepril  40 mg    He monitors his blood pressure at home, noting it is better when not in severe pain, with readings around 128-130 mmHg systolic. He takes KLOR 20 meq daily as needed, approximately every other day.  No chest pain, difficulty breathing, or palpitations.  COMPREHENSIVE METABOLIC PANEL(COMP) Order: [id number] Component Ref Range & Units 3 d ago  Sodium 136 - 148 MMOL/L 143  Potassium 3.5 - 5.2 MMOL/L 2.9 Low   Chloride 98 - 108 MMOL/L 105  CO2 20 - 32 MMOL/L 28  Urea Nitrogen 7 - 23 MG/DL 13  Creatinine 9.29 - 8.69 MG/DL 9.09  Glucose, Bld 74 - 106 mg/dL 93   Comment: Non fasting glucose range:  65-140 mg/dl Impaired fasting Glucose:  107-125 mg/dL  Total Protein 5.7 - 8.2 G/DL 7.8  Albumin 3.2 - 5.0 G/DL 4.9  Calcium  8.5 - 10.4 MG/DL 89.8  Total Bilirubin 0.3 - 1.2 MG/DL 0.5  Alkaline Phosphatase, Serum 46 - 116 U/L 112  AST 15 - 37 U/L 19  ALT 10 - 49 U/L 29  Globulin 1.7 - 3.9 G/DL 2.9  Albumin/Globulin 0.7 - 2.3 RATIO 1.7  Anion Gap 3 - 12 mmol/L 10  Osmolality Calc 278 - 305 MOS/KG 296  Comment: Calculated Osmolality formula has been changed to the Smithline-Gardner Formula: [2(NA)+GLU/18+BUN/2.8]. Please note change in value from the previous calculated results.  Bun/Creatinine 7 - 20 14.4  Glom Filt Rate, Estimated >60 ml/min/1.73 m2 >90   Lab Results  Component Value Date   WBC 12.6 (H) 06/24/2022   HGB 16.4 06/24/2022   HCT 45.9 06/24/2022   MCV 84.8 06/24/2022   PLT 225 06/24/2022   Lab Results  Component Value Date   ALT 28 06/24/2022   AST 24 06/24/2022   ALKPHOS 82 06/24/2022   BILITOT 0.5 06/24/2022   Lab Results  Component Value Date   LIPASE 33 06/24/2022   Allergic rhinitis: He uses Flonase  nasal spray daily as needed, which helps with rhinorrhea and nasal congestion.  He needs a refill.  Review of Systems  Constitutional:  Negative for chills, fever and unexpected weight change.  HENT:  Negative for facial swelling, sore throat  and trouble swallowing.   Eyes:  Negative for redness and visual disturbance.  Respiratory:  Negative for cough and wheezing.   Cardiovascular:  Negative for leg swelling.  Gastrointestinal:  Negative for blood in stool.  Endocrine: Negative for cold intolerance and heat intolerance.  Genitourinary:  Negative for decreased urine volume, dysuria and hematuria.  Skin:  Negative for rash.  Allergic/Immunologic: Positive for environmental allergies.  Neurological:  Negative for syncope, facial asymmetry, weakness and headaches.  Psychiatric/Behavioral:  Negative for  confusion and hallucinations.   See other pertinent positives and negatives in HPI.  Medications Ordered Prior to Encounter[1]  Past Medical History:  Diagnosis Date   Chronic abdominal pain    Diverticulitis    Drug-seeking behavior    Hypertension    Kidney stones    Pancreatitis, chronic (HCC) 05/28/2005   Allergies[2]  Social History   Socioeconomic History   Marital status: Married    Spouse name: Not on file   Number of children: 1   Years of education: Not on file   Highest education level: Some college, no degree  Occupational History   Not on file  Tobacco Use   Smoking status: Every Day    Current packs/day: 0.50    Types: Cigarettes   Smokeless tobacco: Never  Vaping Use   Vaping status: Never Used  Substance and Sexual Activity   Alcohol use: Not Currently   Drug use: No   Sexual activity: Not on file  Other Topics Concern   Not on file  Social History Narrative   Not on file   Social Drivers of Health   Tobacco Use: High Risk (06/09/2024)   Patient History    Smoking Tobacco Use: Every Day    Smokeless Tobacco Use: Never    Passive Exposure: Not on file  Financial Resource Strain: Not on file  Food Insecurity: Low Risk (01/07/2024)   Received from [facility]   Epic    Within the past 12 months, you worried that your food would run out before you got money to buy more: Never true    Within the past 12 months, the food you bought just didn't last and you didn't have money to get more. : Never true  Transportation Needs: No Transportation Needs (01/07/2024)   Received from [facility]    In the past 12 months, has lack of reliable transportation kept you from medical appointments, meetings, work or from getting things needed for daily living? : No  Physical Activity: Not on file  Stress: Not on file  Social Connections: Not on file  Depression (PHQ2-9): Low Risk (07/29/2023)   Depression (PHQ2-9)    PHQ-2 Score: 0  Alcohol  Screen: Not on file  Housing: Low Risk (01/07/2024)   Received from [facility]   Epic    What is your living situation today?: I have a steady place to live    Think about the place you live. Do you have problems with any of the following? Choose all that apply:: None/None on this list  Utilities: Low Risk (01/07/2024)   Received from [facility]   Utilities    In the past 12 months has the electric, gas, oil, or water company threatened to shut off services in your home? : No  Health Literacy: Not on file    Vitals:   06/09/24 0747  BP: 138/88  Pulse: 71  Resp: 16  Temp: 98 F (36.7 C)  SpO2: 97%  Body mass index is 27.79 kg/m.  Physical Exam Vitals and nursing note reviewed.  Constitutional:      General: He is not in acute distress.    Appearance: He is well-developed.  HENT:     Head: Normocephalic and atraumatic.     Mouth/Throat:     Mouth: Mucous membranes are moist.     Dentition: Has dentures.     Pharynx: Oropharynx is clear.  Eyes:     Conjunctiva/sclera: Conjunctivae normal.  Cardiovascular:     Rate and Rhythm: Normal rate and regular rhythm.     Pulses:          Dorsalis pedis pulses are 2+ on the right side and 2+ on the left side.     Heart sounds: No murmur heard. Pulmonary:     Effort: Pulmonary effort is normal. No respiratory distress.     Breath sounds: Normal breath sounds.  Abdominal:     Palpations: Abdomen is soft. There is no hepatomegaly or mass.     Tenderness: There is abdominal tenderness in the right upper quadrant and periumbilical area. There is no guarding or rebound.  Lymphadenopathy:     Cervical: No cervical adenopathy.  Skin:    General: Skin is warm.     Findings: No erythema or rash.  Neurological:     General: No focal deficit present.     Mental Status: He is alert and oriented to person, place, and time.     Cranial Nerves: No cranial nerve deficit.     Gait: Gait normal.  Psychiatric:        Mood and Affect:  Mood and affect normal.   ASSESSMENT AND PLAN:  [name] was seen today for hospitalization follow-up.  Diagnoses and all orders for this visit: Orders Placed This Encounter  Procedures   Potassium   Magnesium    Hypokalemia Assessment & Plan: This seems to be a chronic problem, usually labs are checked in the ER when he presents with abdominal pain and vomiting. He takes K-Lor 20 mK daily as needed. Last K+ in the ED was 2.9 on 06/06/2024. Further recommendation will be given according to potassium result.  Orders: -     Potassium; Future -     Magnesium ; Future  Allergic rhinitis, unspecified seasonality, unspecified trigger Assessment & Plan: Problem is well-controlled. Currently he is on Flonase  nasal spray 1 spray twice daily as needed, refill sent.  Orders: -     Fluticasone  Propionate; Place 1 spray into both nostrils 2 (two) times daily.  Dispense: 48 mL; Refill: 1  Hypertension, essential, benign Assessment & Plan: BP mildly elevated today, reporting lower BPs at home when he is not in pain. Amlodipine -benazepril , changed to combination tablet, 5-40 mg daily and continue metoprolol  succinate 100 mg daily. Continue low-salt diet. Continue monitoring BP at home.  Orders: -     amLODIPine  Besy-Benazepril  HCl; Take 1 capsule by mouth daily.  Dispense: 90 capsule; Refill: 2 -     Metoprolol  Succinate ER; Take 1 tablet (100 mg total) by mouth daily. Take with or immediately following a meal.  Dispense: 90 tablet; Refill: 3  Chronic abdominal pain Assessment & Plan: Thought to be related with chronic pancreatitis. He is currently following with gastroenterologist every 3 months. He is no longer established with pain management.       Return if symptoms worsen or fail to improve, for keep next appointment for CPE.   Aditi Rovira G. Sharief Wainwright, MD  [facility]. [place] office.     [1]  Current Outpatient Medications on File Prior to Visit   Medication Sig Dispense Refill   amLODipine  (NORVASC ) 5 MG tablet Take 1 tablet (5 mg total) by mouth daily. 90 tablet 3   benazepril  (LOTENSIN ) 40 MG tablet Take 1 tablet (40 mg total) by mouth daily. 90 tablet 3   fluticasone  (FLONASE ) 50 MCG/ACT nasal spray PLACE 1 SPRAY INTO BOTH NOSTRILS 2 (TWO) TIMES DAILY 48 mL 1   KLOR-CON  M20 20 MEQ tablet TAKE 1 TABLET BY MOUTH EVERY DAY 90 tablet 1   Melatonin 10 MG TABS Take 2 tablets by mouth at bedtime as needed (for sleep). Take 20 mg by mouth nightly as needed for sleep.     metoprolol  succinate (TOPROL -XL) 100 MG 24 hr tablet TAKE 1 TABLET BY MOUTH EVERY DAY WITH OR IMMEDIATELY FOLLOWING A MEAL 90 tablet 1   naloxone  (NARCAN ) nasal spray 4 mg/0.1 mL Place 1 spray into the nose once. Use according to label directions only if opioid overdose is suspected.     ondansetron  (ZOFRAN ) 4 MG tablet Take 1 tablet (4 mg total) by mouth every 4 (four) hours as needed for nausea or vomiting. 5 tablet 0   ondansetron  (ZOFRAN -ODT) 4 MG disintegrating tablet Take 1 tablet (4 mg total) by mouth every 8 (eight) hours as needed for nausea or vomiting. 20 tablet 0   Pancrelipase, Lip-Prot-Amyl, 24000-76000 units CPEP Take 72,000 Units by mouth 3 (three) times daily.     promethazine  (PHENERGAN ) 25 MG tablet Take 1 tablet (25 mg total) by mouth every 6 (six) hours as needed for nausea or vomiting. 20 tablet 0   rosuvastatin  (CRESTOR ) 10 MG tablet Take 1 tablet (10 mg total) by mouth daily. Due for physical/follow up 90 tablet 3   amitriptyline (ELAVIL) 50 MG tablet Take 50 mg by mouth at bedtime. Take 50 mg by mouth about two hours before bedtime (Patient not taking: Reported on 06/09/2024)     buprenorphine (BUTRANS) 10 MCG/HR PTWK Place 1 patch onto the skin once a week. Place 1 patch on the skin every 7 days for 28 days. (Patient not taking: Reported on 06/09/2024)     lidocaine  (LIDODERM ) 5 % Place 1 patch onto the skin daily. Remove & Discard patch within 12 hours or  as directed by MD (Patient not taking: Reported on 06/09/2024)     OxyCODONE  HCl, Abuse Deter, (OXAYDO ) 5 MG TABA Take 1 tablet by mouth every 4 (four) hours as needed (pain). Oxycodone  5mg  q4hrs prn for 3 days (Patient not taking: Reported on 06/09/2024)     promethazine  (PHENERGAN ) 25 MG suppository Place 1 suppository (25 mg total) rectally every 6 (six) hours as needed for nausea or vomiting. (Patient not taking: Reported on 06/09/2024) 12 each 0   No current facility-administered medications on file prior to visit.  [2]  Allergies Allergen Reactions   Cortisone Other (See Comments)    drops potassium level bottoms out potassium level drops potassium level Bottoms out potassium  bottoms out potassium level Other reaction(s): Other (See Comments) Bottoms out potassium   Egg Protein-Containing Drug Products Hives and Other (See Comments)    Rash  Rash   Ketorolac  Hives and Other (See Comments)    Hives, slightly labored breathing  Hives, slightly labored breathing   Haldol  [Haloperidol  Lactate] Other (See Comments)    jittery    Hydrocortisone Hives    Also drops potassium level   Iodinated Contrast Media Nausea  And Vomiting and Other (See Comments)    Hives Hives Hives Hives   Ketorolac  Tromethamine  Hives   Reglan  [Metoclopramide ]     Face draws and gets figgety   Compazine  [Prochlorperazine ] Other (See Comments)    Dystonic reaction   Droperidol  Other (See Comments)    Makes face draw up   Ketamine      Patient reports  I am allergic to it.   "

## 2024-06-09 NOTE — Assessment & Plan Note (Signed)
 This seems to be a chronic problem, usually labs are checked in the ER when he presents with abdominal pain and vomiting. He takes K-Lor 20 mK daily as needed. Last K+ in the ED was 2.9 on 06/06/2024. Further recommendation will be given according to potassium result.

## 2024-06-09 NOTE — Patient Instructions (Addendum)
 A few things to remember from today's visit:  Hypokalemia - Plan: Potassium, Magnesium   Allergic rhinitis, unspecified seasonality, unspecified trigger - Plan: fluticasone  (FLONASE ) 50 MCG/ACT nasal spray  Hypertension, essential, benign - Plan: metoprolol  succinate (TOPROL -XL) 100 MG 24 hr tablet  No changes today.  If you need refills for medications you take chronically, please call your pharmacy. Do not use My Chart to request refills or for acute issues that need immediate attention. If you send a my chart message, it may take a few days to be addressed, specially if I am not in the office.  Please be sure medication list is accurate. If a new problem present, please set up appointment sooner than planned today.

## 2024-06-09 NOTE — Assessment & Plan Note (Signed)
 Thought to be related with chronic pancreatitis. He is currently following with gastroenterologist every 3 months. He is no longer established with pain management.

## 2024-06-09 NOTE — Assessment & Plan Note (Signed)
 Problem is well-controlled. Currently he is on Flonase  nasal spray 1 spray twice daily as needed, refill sent.

## 2024-06-10 ENCOUNTER — Telehealth: Payer: Self-pay | Admitting: *Deleted

## 2024-06-10 ENCOUNTER — Encounter: Payer: Self-pay | Admitting: Family Medicine

## 2024-06-10 ENCOUNTER — Ambulatory Visit: Payer: Self-pay | Admitting: Family Medicine

## 2024-06-10 DIAGNOSIS — E876 Hypokalemia: Secondary | ICD-10-CM

## 2024-06-10 MED ORDER — POTASSIUM CHLORIDE CRYS ER 20 MEQ PO TBCR: 20.0000 meq | EXTENDED_RELEASE_TABLET | Freq: Every day | ORAL | 1 refills | Status: AC

## 2024-06-10 NOTE — Addendum Note (Signed)
 Addended by: VANICE LUCIENNE PARAS on: 06/10/2024 10:56 AM   Modules accepted: Orders

## 2024-06-10 NOTE — Telephone Encounter (Signed)
 Patient has been informed and medication has been sent to the pharmacy.

## 2024-06-10 NOTE — Telephone Encounter (Signed)
 Spoke with patient in regards of his potassium. Patient voiced understanding and medication has been sent to the pharmacy.

## 2024-06-10 NOTE — Telephone Encounter (Signed)
 Copied from CRM [id number]. Topic: Clinical - Lab/Test Results >> Jun 10, 2024 10:24 AM Emylou G wrote: Reason for CRM: Patient called adv of labs.. Pls call him back if you need anything else?  He said we called again 10 min ago?

## 2024-06-10 NOTE — Telephone Encounter (Signed)
 LVMTRC

## 2024-06-10 NOTE — Telephone Encounter (Signed)
 Patient has returned call and stated it was okay to pre fill out the forms.

## 2024-06-11 ENCOUNTER — Encounter: Payer: Self-pay | Admitting: Family Medicine

## 2024-06-15 ENCOUNTER — Encounter: Payer: Self-pay | Admitting: Family Medicine

## 2024-06-23 LAB — LAB REPORT - SCANNED

## 2024-06-30 LAB — LAB REPORT - SCANNED
